# Patient Record
Sex: Female | Born: 1975 | Race: Black or African American | Hispanic: No | Marital: Married | Smoking: Current every day smoker
Health system: Southern US, Community
[De-identification: ages and names within clinical notes are randomized; demographics above are authoritative.]

## PROBLEM LIST (undated history)

## (undated) ENCOUNTER — Inpatient Hospital Stay (HOSPITAL_COMMUNITY): Payer: Self-pay

## (undated) DIAGNOSIS — I509 Heart failure, unspecified: Secondary | ICD-10-CM

## (undated) DIAGNOSIS — I10 Essential (primary) hypertension: Secondary | ICD-10-CM

## (undated) DIAGNOSIS — R000 Tachycardia, unspecified: Secondary | ICD-10-CM

## (undated) DIAGNOSIS — F3111 Bipolar disorder, current episode manic without psychotic features, mild: Secondary | ICD-10-CM

## (undated) DIAGNOSIS — M79603 Pain in arm, unspecified: Secondary | ICD-10-CM

## (undated) DIAGNOSIS — E0590 Thyrotoxicosis, unspecified without thyrotoxic crisis or storm: Secondary | ICD-10-CM

## (undated) DIAGNOSIS — Z970 Presence of artificial eye: Secondary | ICD-10-CM

## (undated) HISTORY — DX: Presence of artificial eye: Z97.0

## (undated) HISTORY — DX: Heart failure, unspecified: I50.9

## (undated) HISTORY — PX: DILATION AND CURETTAGE OF UTERUS: SHX78

## (undated) HISTORY — PX: EYE SURGERY: SHX253

## (undated) HISTORY — DX: Pain in arm, unspecified: M79.603

## (undated) HISTORY — DX: Essential (primary) hypertension: I10

## (undated) HISTORY — DX: Tachycardia, unspecified: R00.0

---

## 2007-03-29 ENCOUNTER — Emergency Department (HOSPITAL_COMMUNITY): Admission: EM | Admit: 2007-03-29 | Discharge: 2007-03-29 | Payer: Self-pay | Admitting: Family Medicine

## 2007-05-17 ENCOUNTER — Emergency Department (HOSPITAL_COMMUNITY): Admission: EM | Admit: 2007-05-17 | Discharge: 2007-05-17 | Payer: Self-pay | Admitting: Family Medicine

## 2010-02-10 ENCOUNTER — Emergency Department (HOSPITAL_COMMUNITY): Admission: EM | Admit: 2010-02-10 | Discharge: 2010-02-10 | Payer: Self-pay | Admitting: Emergency Medicine

## 2010-04-25 ENCOUNTER — Emergency Department (HOSPITAL_COMMUNITY): Admission: EM | Admit: 2010-04-25 | Discharge: 2010-04-25 | Payer: Self-pay | Admitting: Emergency Medicine

## 2010-05-03 ENCOUNTER — Observation Stay (HOSPITAL_COMMUNITY): Admission: EM | Admit: 2010-05-03 | Discharge: 2010-05-05 | Payer: Self-pay | Admitting: Emergency Medicine

## 2010-09-08 LAB — CBC
HCT: 37.1 % (ref 36.0–46.0)
MCHC: 33.4 g/dL (ref 30.0–36.0)
Platelets: 282 10*3/uL (ref 150–400)
RBC: 4.88 MIL/uL (ref 3.87–5.11)
RDW: 14.6 % (ref 11.5–15.5)
RDW: 14.6 % (ref 11.5–15.5)
WBC: 10.5 10*3/uL (ref 4.0–10.5)
WBC: 20.2 10*3/uL — ABNORMAL HIGH (ref 4.0–10.5)

## 2010-09-08 LAB — URINALYSIS, ROUTINE W REFLEX MICROSCOPIC
Glucose, UA: NEGATIVE mg/dL
Nitrite: NEGATIVE
Protein, ur: NEGATIVE mg/dL
pH: 6.5 (ref 5.0–8.0)

## 2010-09-08 LAB — BASIC METABOLIC PANEL
BUN: 11 mg/dL (ref 6–23)
Calcium: 8.4 mg/dL (ref 8.4–10.5)
Chloride: 102 mEq/L (ref 96–112)
Creatinine, Ser: 1.22 mg/dL — ABNORMAL HIGH (ref 0.4–1.2)
GFR calc Af Amer: >60 mL/min (ref >60)
GFR calc non Af Amer: 51 mL/min — ABNORMAL LOW (ref >60)

## 2010-09-08 LAB — URINE MICROSCOPIC-ADD ON

## 2010-09-08 LAB — GC/CHLAMYDIA PROBE AMP, GENITAL
Chlamydia, DNA Probe: NEGATIVE
GC Probe Amp, Genital: NEGATIVE

## 2010-09-08 LAB — COMPREHENSIVE METABOLIC PANEL
ALT: 94 U/L — ABNORMAL HIGH (ref 0–35)
AST: 49 U/L — ABNORMAL HIGH (ref 0–37)
Albumin: 3 g/dL — ABNORMAL LOW (ref 3.5–5.2)
Alkaline Phosphatase: 113 U/L (ref 39–117)
Calcium: 8.6 mg/dL (ref 8.4–10.5)
GFR calc Af Amer: >60 mL/min (ref >60)
Glucose, Bld: 250 mg/dL — ABNORMAL HIGH (ref 70–99)
Potassium: 4.1 mEq/L (ref 3.5–5.1)
Sodium: 141 mEq/L (ref 135–145)
Total Protein: 5.8 g/dL — ABNORMAL LOW (ref 6.0–8.3)

## 2010-09-08 LAB — DIFFERENTIAL
Basophils Absolute: 0 10*3/uL (ref 0.0–0.1)
Lymphocytes Relative: 26 % (ref 12–46)
Lymphs Abs: 2.7 10*3/uL (ref 0.7–4.0)
Neutrophils Relative %: 46 % (ref 43–77)

## 2010-09-08 LAB — WET PREP, GENITAL: Trich, Wet Prep: NONE SEEN

## 2010-09-11 LAB — DIFFERENTIAL
Basophils Relative: 2 % — ABNORMAL HIGH (ref 0–1)
Eosinophils Absolute: 0.3 10*3/uL (ref 0.0–0.7)
Lymphs Abs: 1.8 10*3/uL (ref 0.7–4.0)
Monocytes Absolute: 0.9 10*3/uL (ref 0.1–1.0)
Monocytes Relative: 14 % — ABNORMAL HIGH (ref 3–12)
Neutrophils Relative %: 52 % (ref 43–77)

## 2010-09-11 LAB — CBC
HCT: 35.2 % — ABNORMAL LOW (ref 36.0–46.0)
Hemoglobin: 11.9 g/dL — ABNORMAL LOW (ref 12.0–15.0)
MCH: 28.4 pg (ref 26.0–34.0)
MCHC: 33.8 g/dL (ref 30.0–36.0)
MCV: 84 fL (ref 78.0–100.0)
RBC: 4.19 MIL/uL (ref 3.87–5.11)

## 2010-09-11 LAB — POCT CARDIAC MARKERS
CKMB, poc: <1 ng/mL — ABNORMAL LOW (ref 1.0–8.0)
Myoglobin, poc: 51.6 ng/mL (ref 12–200)
Myoglobin, poc: 54.3 ng/mL (ref 12–200)
Troponin i, poc: <0.05 ng/mL (ref 0.00–0.09)

## 2010-09-11 LAB — POCT I-STAT, CHEM 8
Chloride: 107 mEq/L (ref 96–112)
Creatinine, Ser: 1.5 mg/dL — ABNORMAL HIGH (ref 0.4–1.2)
HCT: 39 % (ref 36.0–46.0)
Hemoglobin: 13.3 g/dL (ref 12.0–15.0)
Potassium: 3.7 mEq/L (ref 3.5–5.1)
Sodium: 139 mEq/L (ref 135–145)

## 2010-10-11 ENCOUNTER — Emergency Department (HOSPITAL_COMMUNITY)
Admission: EM | Admit: 2010-10-11 | Discharge: 2010-10-12 | Disposition: A | Discharge status: Home / Self Care | Payer: Medicare Other | Attending: Emergency Medicine | Admitting: Emergency Medicine

## 2010-10-11 ENCOUNTER — Emergency Department (HOSPITAL_COMMUNITY): Payer: Medicare Other

## 2010-10-11 DIAGNOSIS — K5289 Other specified noninfective gastroenteritis and colitis: Secondary | ICD-10-CM | POA: Insufficient documentation

## 2010-10-11 DIAGNOSIS — X58XXXA Exposure to other specified factors, initial encounter: Secondary | ICD-10-CM | POA: Insufficient documentation

## 2010-10-11 DIAGNOSIS — L259 Unspecified contact dermatitis, unspecified cause: Secondary | ICD-10-CM | POA: Insufficient documentation

## 2010-10-11 DIAGNOSIS — T7840XA Allergy, unspecified, initial encounter: Secondary | ICD-10-CM | POA: Insufficient documentation

## 2010-10-11 DIAGNOSIS — R220 Localized swelling, mass and lump, head: Secondary | ICD-10-CM | POA: Insufficient documentation

## 2010-10-11 DIAGNOSIS — I10 Essential (primary) hypertension: Secondary | ICD-10-CM | POA: Insufficient documentation

## 2010-10-11 LAB — URINALYSIS, ROUTINE W REFLEX MICROSCOPIC
Ketones, ur: NEGATIVE mg/dL
Nitrite: NEGATIVE
Protein, ur: NEGATIVE mg/dL
Urobilinogen, UA: 0.2 mg/dL (ref 0.0–1.0)

## 2010-10-11 LAB — DIFFERENTIAL
Basophils Absolute: 0 10*3/uL (ref 0.0–0.1)
Basophils Relative: 0 % (ref 0–1)
Eosinophils Absolute: 0.9 10*3/uL — ABNORMAL HIGH (ref 0.0–0.7)
Monocytes Absolute: 0.2 10*3/uL (ref 0.1–1.0)
Monocytes Relative: 3 % (ref 3–12)
Neutrophils Relative %: 74 % (ref 43–77)

## 2010-10-11 LAB — COMPREHENSIVE METABOLIC PANEL
Alkaline Phosphatase: 45 U/L (ref 39–117)
BUN: 15 mg/dL (ref 6–23)
Calcium: 8.3 mg/dL — ABNORMAL LOW (ref 8.4–10.5)
Glucose, Bld: 126 mg/dL — ABNORMAL HIGH (ref 70–99)
Potassium: 3.6 mEq/L (ref 3.5–5.1)
Total Protein: 6.2 g/dL (ref 6.0–8.3)

## 2010-10-11 LAB — CBC
MCH: 27.9 pg (ref 26.0–34.0)
MCHC: 33.2 g/dL (ref 30.0–36.0)
Platelets: 244 10*3/uL (ref 150–400)

## 2010-10-11 LAB — LIPASE, BLOOD: Lipase: 25 U/L (ref 11–59)

## 2010-10-12 LAB — RAPID URINE DRUG SCREEN, HOSP PERFORMED: Cocaine: NOT DETECTED

## 2010-10-12 LAB — CARBAMAZEPINE LEVEL, TOTAL: Carbamazepine Lvl: 5.6 ug/mL (ref 4.0–12.0)

## 2011-04-02 ENCOUNTER — Ambulatory Visit: Payer: Medicare Other | Admitting: Cardiovascular Disease

## 2011-04-06 LAB — POCT URINALYSIS DIP (DEVICE)
Bilirubin Urine: NEGATIVE
Glucose, UA: NEGATIVE
Nitrite: NEGATIVE
Operator id: 126491

## 2011-04-06 LAB — WET PREP, GENITAL
Trich, Wet Prep: NONE SEEN
Yeast Wet Prep HPF POC: NONE SEEN

## 2011-04-06 LAB — POCT PREGNANCY, URINE: Preg Test, Ur: NEGATIVE

## 2011-04-06 LAB — GC/CHLAMYDIA PROBE AMP, GENITAL: Chlamydia, DNA Probe: NEGATIVE

## 2011-04-07 ENCOUNTER — Encounter: Payer: Self-pay | Admitting: *Deleted

## 2011-04-08 ENCOUNTER — Encounter: Payer: Self-pay | Admitting: Cardiovascular Disease

## 2011-04-08 ENCOUNTER — Ambulatory Visit (INDEPENDENT_AMBULATORY_CARE_PROVIDER_SITE_OTHER): Payer: Medicare Other | Admitting: Cardiovascular Disease

## 2011-04-08 DIAGNOSIS — Z01810 Encounter for preprocedural cardiovascular examination: Secondary | ICD-10-CM

## 2011-04-08 DIAGNOSIS — R000 Tachycardia, unspecified: Secondary | ICD-10-CM | POA: Insufficient documentation

## 2011-04-08 DIAGNOSIS — I10 Essential (primary) hypertension: Secondary | ICD-10-CM | POA: Insufficient documentation

## 2011-04-08 LAB — CBC WITH DIFFERENTIAL/PLATELET
Basophils Relative: 0.6 % (ref 0.0–3.0)
Eosinophils Absolute: 0.3 10*3/uL (ref 0.0–0.7)
HCT: 38.6 % (ref 36.0–46.0)
Hemoglobin: 12.9 g/dL (ref 12.0–15.0)
MCHC: 33.3 g/dL (ref 30.0–36.0)
MCV: 87.3 fl (ref 78.0–100.0)
Monocytes Absolute: 0.8 10*3/uL (ref 0.1–1.0)
Neutro Abs: 4.9 10*3/uL (ref 1.4–7.7)
RBC: 4.42 Mil/uL (ref 3.87–5.11)

## 2011-04-08 LAB — I-STAT 8, (EC8 V) (CONVERTED LAB)
BUN: 11
Chloride: 103
Hemoglobin: 16.7 — ABNORMAL HIGH
Operator id: 239701
Potassium: 3.8
pCO2, Ven: 44.2 — ABNORMAL LOW
pH, Ven: 7.376 — ABNORMAL HIGH

## 2011-04-08 LAB — POCT I-STAT CREATININE
Creatinine, Ser: 1
Operator id: 239701

## 2011-04-08 LAB — POCT URINALYSIS DIP (DEVICE)
Bilirubin Urine: NEGATIVE
Ketones, ur: NEGATIVE
Protein, ur: NEGATIVE
Specific Gravity, Urine: <=1.005
pH: 6

## 2011-04-08 MED ORDER — METOPROLOL SUCCINATE ER 50 MG PO TB24: 50.0000 mg | ORAL_TABLET | Freq: Every day | ORAL | Status: DC

## 2011-04-08 MED ORDER — LOSARTAN POTASSIUM 50 MG PO TABS: 50.0000 mg | ORAL_TABLET | Freq: Every day | ORAL | Status: DC

## 2011-04-08 NOTE — Assessment & Plan Note (Signed)
Clear for surgery.  Will do echo to make sure no DCM.
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
## 2011-04-08 NOTE — Assessment & Plan Note (Signed)
Change to Toprol and ARB for better control.  Low sodium diet  Assess LVH and LV size on echo

## 2011-04-08 NOTE — Assessment & Plan Note (Signed)
Improved.  Check Hct and TSH.  Echo to R/O DCM  Denies relapse of drug use

## 2011-04-08 NOTE — Patient Instructions (Addendum)
STOP VISKEN  START METOPROLOL SUCC 50 MG ONCE DAILY  START LOSARTAN 50 MG ONCE DAILY  Your physician has requested that you have an echocardiogram. Echocardiography is a painless test that uses sound waves to create images of your heart. It provides your doctor with information about the size and shape of your heart and how well your heart's chambers and valves are working. This procedure takes approximately one hour. There are no restrictions for this procedure.   Your physician recommends that you return for lab work in: TODAY

## 2011-04-14 ENCOUNTER — Ambulatory Visit (HOSPITAL_COMMUNITY): Payer: Medicare Other | Attending: Cardiovascular Disease

## 2011-04-14 ENCOUNTER — Telehealth: Payer: Self-pay | Admitting: Cardiovascular Disease

## 2011-04-14 DIAGNOSIS — Z01810 Encounter for preprocedural cardiovascular examination: Secondary | ICD-10-CM | POA: Insufficient documentation

## 2011-04-14 DIAGNOSIS — R072 Precordial pain: Secondary | ICD-10-CM

## 2011-04-14 DIAGNOSIS — I079 Rheumatic tricuspid valve disease, unspecified: Secondary | ICD-10-CM | POA: Insufficient documentation

## 2011-04-14 DIAGNOSIS — R000 Tachycardia, unspecified: Secondary | ICD-10-CM | POA: Insufficient documentation

## 2011-04-14 DIAGNOSIS — I10 Essential (primary) hypertension: Secondary | ICD-10-CM

## 2011-04-15 NOTE — Telephone Encounter (Signed)
Please fax a clearance letter for ENT surgery.  Fax to [facility] [phone] attn lisa

## 2011-04-15 NOTE — Telephone Encounter (Signed)
Left message for pt of normal echo results. Office note with clearance and echo faxed to number provided. Christine Cobb .

## 2011-04-16 ENCOUNTER — Telehealth: Payer: Self-pay | Admitting: *Deleted

## 2011-04-16 NOTE — Telephone Encounter (Signed)
SEE LAB REPORT./CY

## 2011-04-27 ENCOUNTER — Encounter: Payer: Self-pay | Admitting: Cardiovascular Disease

## 2011-04-27 NOTE — Progress Notes (Signed)
Addended by: Kem Parkinson on: 04/27/2011 04:32 PM   Modules accepted: Orders

## 2011-08-11 DIAGNOSIS — L0390 Cellulitis, unspecified: Secondary | ICD-10-CM | POA: Diagnosis not present

## 2011-08-11 DIAGNOSIS — M542 Cervicalgia: Secondary | ICD-10-CM | POA: Diagnosis not present

## 2011-08-11 DIAGNOSIS — R946 Abnormal results of thyroid function studies: Secondary | ICD-10-CM | POA: Diagnosis not present

## 2011-08-11 DIAGNOSIS — R6889 Other general symptoms and signs: Secondary | ICD-10-CM | POA: Diagnosis not present

## 2011-08-11 DIAGNOSIS — L0291 Cutaneous abscess, unspecified: Secondary | ICD-10-CM | POA: Diagnosis not present

## 2011-08-19 DIAGNOSIS — E0590 Thyrotoxicosis, unspecified without thyrotoxic crisis or storm: Secondary | ICD-10-CM | POA: Diagnosis not present

## 2011-08-19 DIAGNOSIS — R6889 Other general symptoms and signs: Secondary | ICD-10-CM | POA: Diagnosis not present

## 2011-08-24 DIAGNOSIS — F4310 Post-traumatic stress disorder, unspecified: Secondary | ICD-10-CM | POA: Diagnosis not present

## 2011-08-24 DIAGNOSIS — F319 Bipolar disorder, unspecified: Secondary | ICD-10-CM | POA: Diagnosis not present

## 2011-09-02 ENCOUNTER — Emergency Department (HOSPITAL_COMMUNITY)
Admission: EM | Admit: 2011-09-02 | Discharge: 2011-09-02 | Disposition: A | Discharge status: Home / Self Care | Payer: Medicare Other | Attending: Emergency Medicine | Admitting: Emergency Medicine

## 2011-09-02 ENCOUNTER — Encounter (HOSPITAL_COMMUNITY): Payer: Self-pay | Admitting: *Deleted

## 2011-09-02 DIAGNOSIS — N898 Other specified noninflammatory disorders of vagina: Secondary | ICD-10-CM | POA: Diagnosis not present

## 2011-09-02 DIAGNOSIS — R109 Unspecified abdominal pain: Secondary | ICD-10-CM | POA: Insufficient documentation

## 2011-09-02 DIAGNOSIS — Z79899 Other long term (current) drug therapy: Secondary | ICD-10-CM | POA: Insufficient documentation

## 2011-09-02 DIAGNOSIS — I10 Essential (primary) hypertension: Secondary | ICD-10-CM | POA: Diagnosis not present

## 2011-09-02 MED ORDER — VALACYCLOVIR HCL 1 G PO TABS: 500.0000 mg | ORAL_TABLET | Freq: Two times a day (BID) | ORAL | Status: AC

## 2011-09-02 MED ORDER — VALACYCLOVIR HCL 1 G PO TABS: 500.0000 mg | ORAL_TABLET | Freq: Two times a day (BID) | ORAL | Status: DC

## 2011-09-02 MED ORDER — HYDROCODONE-ACETAMINOPHEN 5-325 MG PO TABS: 2.0000 | ORAL_TABLET | ORAL | Status: AC | PRN

## 2011-09-02 NOTE — ED Provider Notes (Signed)
History     CSN: [id number]  Arrival date & time 09/02/11  0807   First MD Initiated Contact with Patient 09/02/11 [phone]      Chief Complaint  Patient presents with  . Groin Pain    vaginal "bumps;painful     HPI Pt reports noticing painful "bumps" on the left side of her groin area. Pt denies noticing any drainage. Pt reports pain is so severe today that she can barely wear an underwear.  Past Medical History  Diagnosis Date  . Sinus tachycardia   . HTN (hypertension)   . Arm pain     History reviewed. No pertinent past surgical history.  No family history on file.  History  Substance Use Topics  . Smoking status: Current Everyday Smoker    Types: Cigarettes  . Smokeless tobacco: Not on file  . Alcohol Use: No    OB History    Grav Para Term Preterm Abortions TAB SAB Ect Mult Living                  Review of Systems  All other systems reviewed and are negative.    Allergies  Eggs or egg-derived products and Toradol  Home Medications   Current Outpatient Rx  Name Route Sig Dispense Refill  . LOSARTAN POTASSIUM 50 MG PO TABS Oral Take 1 tablet (50 mg total) by mouth daily. 30 tablet 12  . METOPROLOL SUCCINATE ER 50 MG PO TB24 Oral Take 1 tablet (50 mg total) by mouth daily. 30 tablet 11  . NON FORMULARY  25 mg. reperal injection Every 2 weeks    . PSEUDOEPH-DOXYLAMINE-DM-APAP 60-7.11-24-998 MG/30ML PO LIQD Oral Take 30 mLs by mouth at bedtime as needed. Cold/flu    . HYDROCODONE-ACETAMINOPHEN 5-325 MG PO TABS Oral Take 2 tablets by mouth every 4 (four) hours as needed for pain. 15 tablet 0  . VALACYCLOVIR HCL 1 G PO TABS Oral Take 0.5 tablets (500 mg total) by mouth 2 (two) times daily. 15 tablet 0    BP 138/99  Pulse 86  Temp(Src) 98.3 F (36.8 C) (Oral)  Resp 16  SpO2 100%  LMP 08/22/2011  Physical Exam  Nursing note and vitals reviewed. Constitutional: She is oriented to person, place, and time. She appears well-developed and well-nourished.  No distress.  HENT:  Head: Normocephalic and atraumatic.  Eyes: Pupils are equal, round, and reactive to light.  Neck: Normal range of motion.  Cardiovascular: Normal rate and intact distal pulses.   Pulmonary/Chest: No respiratory distress.  Abdominal: Normal appearance. She exhibits no distension.  Genitourinary:    Pelvic exam was performed with patient supine.  Musculoskeletal: Normal range of motion.  Neurological: She is alert and oriented to person, place, and time. No cranial nerve deficit.  Skin: Skin is warm and dry. No rash noted.  Psychiatric: She has a normal mood and affect. Her behavior is normal.    ED Course  Procedures (including critical care time)  Labs Reviewed - No data to display No results found.   1. Vaginal lesion       MDM  Viral culture obtained and awaiting results        Nelia Shi, MD 09/02/11 [phone]

## 2011-09-02 NOTE — Discharge Instructions (Signed)
Herpes Labialis  You have a fever blister or cold sore (herpes labialis). These painful, grouped sores are caused by one of the herpes viruses (HSV1 most commonly). They are usually found around the lips and mouth, but the same infection can also affect other areas on the face such as the nose and eyes. Herpes infections take about 10 days to heal. They often occur again and again in the same spot. Other symptoms may include numbness and tingling in the involved skin, achiness, fever, and swollen glands in the neck. Colds, emotional stress, injuries, or excess sunlight exposure all seem to make herpes reappear. Herpes lip infections are contagious. Direct contact with these sores can spread the infection. It can also be spread to other parts of your own body.  TREATMENT   Herpes labialis is usually self-limited and resolves within 1 week. To reduce pain and swelling, apply ice packs frequently to the sores or suck on popsicles or frozen juice bars. Antiviral medicine may be used by mouth to shorten the duration of the breakout. Avoid spreading the infection by washing your hands often. Be careful not to touch your eyes or genital areas after handling the infected blisters. Do not kiss or have other intimate contact with others. After the blisters are completely healed you may resume contact. Use sunscreen to lessen recurrences.   If this is your first infection with herpes, or if you have a severe or repeated infections, your caregiver may prescribe one of the anti-viral drugs to speed up the healing. If you have sun-related flare-ups despite the use of sunscreen, starting oral anti-viral medicine before a prolonged exposure (going skiing or to the beach) can prevent most episodes.   SEEK IMMEDIATE MEDICAL CARE IF:   You develop a headache, sleepiness, high fever, vomiting, or severe weakness.   You have eye irritation, pain, blurred vision or redness.   You develop a prolonged infection not getting better in 10  days.  Document Released: 06/14/2005 Document Revised: 06/03/2011 Document Reviewed: 04/18/2009  [employer] Patient Information 2012 [employer].

## 2011-09-02 NOTE — ED Notes (Signed)
Pt reports noticing painful "bumps" on the left side of her groin area.  Pt denies noticing any drainage.  Pt reports pain is so severe today that she can barely wear an underwear.

## 2011-09-03 DIAGNOSIS — B009 Herpesviral infection, unspecified: Secondary | ICD-10-CM | POA: Diagnosis not present

## 2011-09-03 LAB — HERPES SIMPLEX VIRUS CULTURE

## 2011-09-04 NOTE — ED Notes (Signed)
+  Herpes. Patient treated with Valtrex. °

## 2011-09-07 NOTE — ED Notes (Signed)
Patient informed of positive results after id'd x 2 and educated to HERPES precautions.Marland Kitchen

## 2011-09-07 NOTE — ED Notes (Signed)
Patient informed of positive results after id'd x 2 and informed of need to notify partner to be treated. 

## 2011-10-20 ENCOUNTER — Emergency Department (HOSPITAL_COMMUNITY)
Admission: EM | Admit: 2011-10-20 | Discharge: 2011-10-20 | Disposition: A | Discharge status: Home / Self Care | Payer: Medicare Other | Attending: Emergency Medicine | Admitting: Emergency Medicine

## 2011-10-20 ENCOUNTER — Encounter (HOSPITAL_COMMUNITY): Payer: Self-pay | Admitting: Emergency Medicine

## 2011-10-20 DIAGNOSIS — F17200 Nicotine dependence, unspecified, uncomplicated: Secondary | ICD-10-CM | POA: Diagnosis not present

## 2011-10-20 DIAGNOSIS — R0789 Other chest pain: Secondary | ICD-10-CM | POA: Diagnosis not present

## 2011-10-20 DIAGNOSIS — F411 Generalized anxiety disorder: Secondary | ICD-10-CM | POA: Diagnosis not present

## 2011-10-20 DIAGNOSIS — I10 Essential (primary) hypertension: Secondary | ICD-10-CM | POA: Insufficient documentation

## 2011-10-20 DIAGNOSIS — F410 Panic disorder [episodic paroxysmal anxiety] without agoraphobia: Secondary | ICD-10-CM | POA: Diagnosis not present

## 2011-10-20 DIAGNOSIS — R0602 Shortness of breath: Secondary | ICD-10-CM | POA: Insufficient documentation

## 2011-10-20 NOTE — ED Notes (Signed)
Pt c/o chest pain as well as left-sided generalized pain that began earlier tonight. Pt states "it feels like an anxiety attack, i've had them since i was 10". Pt states she "has a lot going on" and "wants some help".

## 2011-10-20 NOTE — ED Provider Notes (Signed)
History     CSN: [id number]  Arrival date & time 10/20/11  2108   First MD Initiated Contact with Patient 10/20/11 2217      Chief Complaint  Patient presents with  . Panic Attack    (Consider location/radiation/quality/duration/timing/severity/associated sxs/prior treatment) HPI Comments: Patient presents today with complaints of a panic attack. She, states she's had a history of anxiety attacks, and she was 36 years old and today she had the same type symptoms, that she's had in the past. Nothing specifically that time. In she presented with shortness of breath, chest tightness, and anxiety, which she's had with her past panic attacks. She says that there is nothing unusual about this attack. She states now she feels much better and is ready to go home. Denies any leg pain or swelling. Denies any fevers or recent illnesses. Denies any suicidal ideations  The history is provided by the patient.    Past Medical History  Diagnosis Date  . Sinus tachycardia   . HTN (hypertension)   . Arm pain     History reviewed. No pertinent past surgical history.  History reviewed. No pertinent family history.  History  Substance Use Topics  . Smoking status: Current Everyday Smoker    Types: Cigarettes  . Smokeless tobacco: Not on file  . Alcohol Use: No    OB History    Grav Para Term Preterm Abortions TAB SAB Ect Mult Living                  Review of Systems  Constitutional: Negative for fever, chills, diaphoresis and fatigue.  HENT: Negative for congestion, rhinorrhea and sneezing.   Eyes: Negative.   Respiratory: Positive for shortness of breath. Negative for cough and chest tightness.   Cardiovascular: Positive for chest pain. Negative for leg swelling.  Gastrointestinal: Negative for nausea, vomiting, abdominal pain, diarrhea and blood in stool.  Genitourinary: Negative for frequency, hematuria, flank pain and difficulty urinating.  Musculoskeletal: Negative for back pain  and arthralgias.  Skin: Negative for rash.  Neurological: Negative for dizziness, speech difficulty, weakness, numbness and headaches.  Psychiatric/Behavioral: Positive for agitation. Negative for suicidal ideas. The patient is nervous/anxious.     Allergies  Eggs or egg-derived products and Toradol  Home Medications   Current Outpatient Rx  Name Route Sig Dispense Refill  . ALBUTEROL SULFATE HFA 108 (90 BASE) MCG/ACT IN AERS Inhalation Inhale 2 puffs into the lungs every 6 (six) hours as needed. For shortness of breath    . LOSARTAN POTASSIUM 50 MG PO TABS Oral Take 1 tablet (50 mg total) by mouth daily. 30 tablet 12  . METOPROLOL SUCCINATE ER 50 MG PO TB24 Oral Take 1 tablet (50 mg total) by mouth daily. 30 tablet 11  . NON FORMULARY  25 mg. reperal injection Every 2 weeks    . OXYCODONE-ACETAMINOPHEN 7.5-500 MG PO TABS Oral Take 1 tablet by mouth every 4 (four) hours as needed. For pain relief    . PSEUDOEPH-DOXYLAMINE-DM-APAP 60-7.11-24-998 MG/30ML PO LIQD Oral Take 30 mLs by mouth at bedtime as needed. Cold/flu      BP 121/94  Pulse 108  Temp(Src) 97.7 F (36.5 C) (Oral)  Resp 12  SpO2 100%  Physical Exam  Constitutional: She is oriented to person, place, and time. She appears well-developed and well-nourished.  HENT:  Head: Normocephalic and atraumatic.  Eyes: Pupils are equal, round, and reactive to light.  Neck: Normal range of motion. Neck supple.  Cardiovascular: Normal rate, regular  rhythm and normal heart sounds.   Pulmonary/Chest: Effort normal and breath sounds normal. No respiratory distress. She has no wheezes. She has no rales. She exhibits no tenderness.  Abdominal: Soft. Bowel sounds are normal. There is no tenderness. There is no rebound and no guarding.  Musculoskeletal: Normal range of motion. She exhibits no edema.  Lymphadenopathy:    She has no cervical adenopathy.  Neurological: She is alert and oriented to person, place, and time.  Skin: Skin is  warm and dry. No rash noted.  Psychiatric: She has a normal mood and affect.    ED Course  Procedures (including critical care time)  Labs Reviewed - No data to display No results found.  Date: 10/20/2011  Rate: 94  Rhythm: normal sinus rhythm  QRS Axis: normal  Intervals: normal  ST/T Wave abnormalities: normal  Conduction Disutrbances:none  Narrative Interpretation:   Old EKG Reviewed: unchanged    1. Anxiety attack       MDM  Pt with panic attack, similar to her past attacks.  Nothing unusual to suggest asthma exacerbation, PE, or ACS.        Rolan Bucco, MD 10/20/11 [phone]

## 2011-10-20 NOTE — Discharge Instructions (Signed)

## 2011-12-15 ENCOUNTER — Encounter (HOSPITAL_COMMUNITY): Payer: Self-pay | Admitting: *Deleted

## 2011-12-15 ENCOUNTER — Emergency Department (HOSPITAL_COMMUNITY)
Admission: EM | Admit: 2011-12-15 | Discharge: 2011-12-15 | Disposition: A | Discharge status: Home / Self Care | Payer: Medicare Other | Attending: Emergency Medicine | Admitting: Emergency Medicine

## 2011-12-15 DIAGNOSIS — Z79899 Other long term (current) drug therapy: Secondary | ICD-10-CM | POA: Insufficient documentation

## 2011-12-15 DIAGNOSIS — T07XXXA Unspecified multiple injuries, initial encounter: Secondary | ICD-10-CM

## 2011-12-15 DIAGNOSIS — S5010XA Contusion of unspecified forearm, initial encounter: Secondary | ICD-10-CM | POA: Diagnosis not present

## 2011-12-15 DIAGNOSIS — T7491XA Unspecified adult maltreatment, confirmed, initial encounter: Secondary | ICD-10-CM | POA: Insufficient documentation

## 2011-12-15 DIAGNOSIS — I10 Essential (primary) hypertension: Secondary | ICD-10-CM | POA: Insufficient documentation

## 2011-12-15 DIAGNOSIS — Z0289 Encounter for other administrative examinations: Secondary | ICD-10-CM | POA: Insufficient documentation

## 2011-12-15 DIAGNOSIS — S0003XA Contusion of scalp, initial encounter: Secondary | ICD-10-CM | POA: Insufficient documentation

## 2011-12-15 DIAGNOSIS — S0083XA Contusion of other part of head, initial encounter: Secondary | ICD-10-CM | POA: Diagnosis not present

## 2011-12-15 DIAGNOSIS — IMO0002 Reserved for concepts with insufficient information to code with codable children: Secondary | ICD-10-CM | POA: Insufficient documentation

## 2011-12-15 DIAGNOSIS — T7492XA Unspecified child maltreatment, confirmed, initial encounter: Secondary | ICD-10-CM | POA: Insufficient documentation

## 2011-12-15 DIAGNOSIS — F1410 Cocaine abuse, uncomplicated: Secondary | ICD-10-CM | POA: Insufficient documentation
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
## 2011-12-15 NOTE — ED Notes (Signed)
.  D/c instructions reviewed w/ pt - pt denies any further questions or concerns at present.  Pt ambulating independently w/ steady gait on d/c in no acute distress, A&Ox4. Pt d/c'd w/ [employer] x2.
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
## 2011-12-15 NOTE — Discharge Instructions (Signed)
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
## 2011-12-28 DIAGNOSIS — N760 Acute vaginitis: Secondary | ICD-10-CM | POA: Diagnosis not present

## 2011-12-28 DIAGNOSIS — Z7721 Contact with and (suspected) exposure to potentially hazardous body fluids: Secondary | ICD-10-CM | POA: Diagnosis not present

## 2011-12-28 DIAGNOSIS — A499 Bacterial infection, unspecified: Secondary | ICD-10-CM | POA: Diagnosis not present

## 2011-12-28 DIAGNOSIS — Z2089 Contact with and (suspected) exposure to other communicable diseases: Secondary | ICD-10-CM | POA: Diagnosis not present

## 2012-01-17 ENCOUNTER — Emergency Department (HOSPITAL_COMMUNITY)
Admission: EM | Admit: 2012-01-17 | Discharge: 2012-01-17 | Disposition: A | Discharge status: Home / Self Care | Payer: Medicare Other | Attending: Emergency Medicine | Admitting: Emergency Medicine

## 2012-01-17 DIAGNOSIS — F17200 Nicotine dependence, unspecified, uncomplicated: Secondary | ICD-10-CM | POA: Insufficient documentation

## 2012-01-17 DIAGNOSIS — I10 Essential (primary) hypertension: Secondary | ICD-10-CM | POA: Insufficient documentation

## 2012-01-17 DIAGNOSIS — R21 Rash and other nonspecific skin eruption: Secondary | ICD-10-CM

## 2012-01-17 MED ORDER — PREDNISONE 20 MG PO TABS: ORAL_TABLET | ORAL | Status: AC

## 2012-01-17 MED ORDER — PREDNISONE 20 MG PO TABS
60.0000 mg | ORAL_TABLET | Freq: Once | ORAL | Status: AC
  Administered 2012-01-17: 60 mg via ORAL
  Filled 2012-01-17: qty 3

## 2012-01-17 MED ORDER — FAMOTIDINE 20 MG PO TABS: 20.0000 mg | ORAL_TABLET | Freq: Two times a day (BID) | ORAL | Status: DC

## 2012-01-17 MED ORDER — DIPHENHYDRAMINE HCL 25 MG PO TABS: 25.0000 mg | ORAL_TABLET | Freq: Four times a day (QID) | ORAL | Status: DC

## 2012-01-17 MED ORDER — DIPHENHYDRAMINE HCL 25 MG PO CAPS
25.0000 mg | ORAL_CAPSULE | Freq: Once | ORAL | Status: AC
  Administered 2012-01-17: 25 mg via ORAL
  Filled 2012-01-17: qty 1

## 2012-01-17 NOTE — ED Provider Notes (Signed)
Medical screening examination/treatment/procedure(s) were performed by non-physician practitioner and as supervising physician I was immediately available for consultation/collaboration. Keison Glendinning, MD, FACEP   Josemiguel Gries L Mete Purdum, MD 01/17/12 1857 
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
## 2012-02-22 DIAGNOSIS — J029 Acute pharyngitis, unspecified: Secondary | ICD-10-CM | POA: Diagnosis not present

## 2012-02-22 DIAGNOSIS — N898 Other specified noninflammatory disorders of vagina: Secondary | ICD-10-CM | POA: Diagnosis not present

## 2012-02-22 DIAGNOSIS — N760 Acute vaginitis: Secondary | ICD-10-CM | POA: Diagnosis not present

## 2012-02-22 DIAGNOSIS — Z7721 Contact with and (suspected) exposure to potentially hazardous body fluids: Secondary | ICD-10-CM | POA: Diagnosis not present

## 2012-02-27 DIAGNOSIS — H5710 Ocular pain, unspecified eye: Secondary | ICD-10-CM | POA: Diagnosis not present

## 2012-02-27 DIAGNOSIS — M25569 Pain in unspecified knee: Secondary | ICD-10-CM | POA: Diagnosis not present

## 2012-02-28 ENCOUNTER — Encounter (HOSPITAL_COMMUNITY): Payer: Self-pay | Admitting: Emergency Medicine

## 2012-02-28 ENCOUNTER — Emergency Department (HOSPITAL_COMMUNITY)
Admission: EM | Admit: 2012-02-28 | Discharge: 2012-02-28 | Disposition: A | Discharge status: Home / Self Care | Payer: Medicare Other | Attending: Emergency Medicine | Admitting: Emergency Medicine

## 2012-02-28 DIAGNOSIS — Z8489 Family history of other specified conditions: Secondary | ICD-10-CM | POA: Diagnosis not present

## 2012-02-28 DIAGNOSIS — H5710 Ocular pain, unspecified eye: Secondary | ICD-10-CM | POA: Insufficient documentation

## 2012-02-28 DIAGNOSIS — Z91012 Allergy to eggs: Secondary | ICD-10-CM | POA: Insufficient documentation

## 2012-02-28 DIAGNOSIS — F17200 Nicotine dependence, unspecified, uncomplicated: Secondary | ICD-10-CM | POA: Diagnosis not present

## 2012-02-28 DIAGNOSIS — Z833 Family history of diabetes mellitus: Secondary | ICD-10-CM | POA: Diagnosis not present

## 2012-02-28 DIAGNOSIS — I10 Essential (primary) hypertension: Secondary | ICD-10-CM | POA: Insufficient documentation

## 2012-02-28 DIAGNOSIS — Z888 Allergy status to other drugs, medicaments and biological substances status: Secondary | ICD-10-CM | POA: Insufficient documentation

## 2012-02-28 DIAGNOSIS — Z970 Presence of artificial eye: Secondary | ICD-10-CM | POA: Diagnosis not present

## 2012-02-28 DIAGNOSIS — Z8249 Family history of ischemic heart disease and other diseases of the circulatory system: Secondary | ICD-10-CM | POA: Insufficient documentation

## 2012-02-28 MED ORDER — HYDROCODONE-ACETAMINOPHEN 5-325 MG PO TABS
1.0000 | ORAL_TABLET | Freq: Once | ORAL | Status: AC
  Administered 2012-02-28: 1 via ORAL
  Filled 2012-02-28: qty 1

## 2012-02-28 MED ORDER — HYDROCODONE-ACETAMINOPHEN 5-325 MG PO TABS: 1.0000 | ORAL_TABLET | Freq: Four times a day (QID) | ORAL | Status: AC | PRN
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
## 2012-02-28 NOTE — ED Provider Notes (Signed)
Medical screening examination/treatment/procedure(s) were performed by non-physician practitioner and as supervising physician I was immediately available for consultation/collaboration.   Fontaine Kossman L Manly Nestle, MD 02/28/12 2232 
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
## 2012-02-28 NOTE — ED Notes (Signed)
Pt does not know if she loss consciousness

## 2012-03-05 ENCOUNTER — Encounter (HOSPITAL_COMMUNITY): Payer: Self-pay

## 2012-03-05 ENCOUNTER — Emergency Department (HOSPITAL_COMMUNITY): Payer: Medicare Other

## 2012-03-05 ENCOUNTER — Emergency Department (HOSPITAL_COMMUNITY)
Admission: EM | Admit: 2012-03-05 | Discharge: 2012-03-06 | Disposition: A | Discharge status: Home / Self Care | Payer: Medicare Other | Attending: Emergency Medicine | Admitting: Emergency Medicine

## 2012-03-05 DIAGNOSIS — F17200 Nicotine dependence, unspecified, uncomplicated: Secondary | ICD-10-CM | POA: Insufficient documentation

## 2012-03-05 DIAGNOSIS — Z79899 Other long term (current) drug therapy: Secondary | ICD-10-CM | POA: Insufficient documentation

## 2012-03-05 DIAGNOSIS — I10 Essential (primary) hypertension: Secondary | ICD-10-CM | POA: Insufficient documentation

## 2012-03-05 DIAGNOSIS — R600 Localized edema: Secondary | ICD-10-CM

## 2012-03-05 DIAGNOSIS — R0602 Shortness of breath: Secondary | ICD-10-CM | POA: Insufficient documentation

## 2012-03-05 DIAGNOSIS — Z888 Allergy status to other drugs, medicaments and biological substances status: Secondary | ICD-10-CM | POA: Insufficient documentation

## 2012-03-05 DIAGNOSIS — T7840XA Allergy, unspecified, initial encounter: Secondary | ICD-10-CM | POA: Diagnosis not present

## 2012-03-05 DIAGNOSIS — R443 Hallucinations, unspecified: Secondary | ICD-10-CM

## 2012-03-05 DIAGNOSIS — R609 Edema, unspecified: Secondary | ICD-10-CM | POA: Insufficient documentation

## 2012-03-05 DIAGNOSIS — T4275XA Adverse effect of unspecified antiepileptic and sedative-hypnotic drugs, initial encounter: Secondary | ICD-10-CM | POA: Insufficient documentation

## 2012-03-05 LAB — URINALYSIS, ROUTINE W REFLEX MICROSCOPIC
Bilirubin Urine: NEGATIVE
Glucose, UA: NEGATIVE mg/dL
Ketones, ur: NEGATIVE mg/dL
pH: 7 (ref 5.0–8.0)

## 2012-03-05 LAB — URINE MICROSCOPIC-ADD ON

## 2012-03-05 LAB — POCT I-STAT, CHEM 8
Calcium, Ion: 1.17 mmol/L (ref 1.12–1.23)
Chloride: 106 mEq/L (ref 96–112)
HCT: 38 % (ref 36.0–46.0)
Hemoglobin: 12.9 g/dL (ref 12.0–15.0)
Potassium: 3.4 mEq/L — ABNORMAL LOW (ref 3.5–5.1)

## 2012-03-05 LAB — CBC WITH DIFFERENTIAL/PLATELET
Basophils Absolute: 0.1 10*3/uL (ref 0.0–0.1)
Eosinophils Absolute: 0.9 10*3/uL — ABNORMAL HIGH (ref 0.0–0.7)
Lymphocytes Relative: 37 % (ref 12–46)
Lymphs Abs: 4 10*3/uL (ref 0.7–4.0)
MCHC: 34.1 g/dL (ref 30.0–36.0)
Monocytes Relative: 9 % (ref 3–12)
Platelets: 320 10*3/uL (ref 150–400)
RDW: 14.5 % (ref 11.5–15.5)
WBC: 10.7 10*3/uL — ABNORMAL HIGH (ref 4.0–10.5)

## 2012-03-05 LAB — RAPID URINE DRUG SCREEN, HOSP PERFORMED
Amphetamines: NOT DETECTED
Barbiturates: NOT DETECTED
Benzodiazepines: NOT DETECTED
Cocaine: POSITIVE — AB

## 2012-03-05 LAB — CARBAMAZEPINE LEVEL, TOTAL: Carbamazepine Lvl: <0.5 ug/mL — ABNORMAL LOW (ref 4.0–12.0)

## 2012-03-05 MED ORDER — MORPHINE SULFATE 4 MG/ML IJ SOLN
4.0000 mg | Freq: Once | INTRAMUSCULAR | Status: AC
  Administered 2012-03-05: 4 mg via INTRAVENOUS
  Filled 2012-03-05: qty 1

## 2012-03-05 MED ORDER — METHYLPREDNISOLONE SODIUM SUCC 125 MG IJ SOLR
125.0000 mg | Freq: Once | INTRAMUSCULAR | Status: AC
  Administered 2012-03-05: 125 mg via INTRAVENOUS
  Filled 2012-03-05: qty 2

## 2012-03-05 MED ORDER — SODIUM CHLORIDE 0.9 % IV SOLN
Freq: Once | INTRAVENOUS | Status: AC
  Administered 2012-03-05: 22:00:00 via INTRAVENOUS

## 2012-03-05 MED ORDER — FAMOTIDINE IN NACL 20-0.9 MG/50ML-% IV SOLN
20.0000 mg | Freq: Once | INTRAVENOUS | Status: AC
  Administered 2012-03-05: 20 mg via INTRAVENOUS
  Filled 2012-03-05: qty 50

## 2012-03-05 MED ORDER — FUROSEMIDE 10 MG/ML IJ SOLN
20.0000 mg | Freq: Once | INTRAMUSCULAR | Status: AC
  Administered 2012-03-05: 20 mg via INTRAVENOUS
  Filled 2012-03-05: qty 4

## 2012-03-05 NOTE — ED Provider Notes (Signed)
History     CSN: [id number]  Arrival date & time 03/05/12  2034   First MD Initiated Contact with Patient 03/05/12 2104      Chief Complaint  Patient presents with  . Leg Swelling    (Consider location/radiation/quality/duration/timing/severity/associated sxs/prior treatment) HPI Comments: Patient states, that 2 days, ago.  She took the Tegretol tablets, that she had leftover she was hearing voices, and she was not due for her Risperdal injection.  Shortly after that, she developed lower extremity swelling, a facial rash, burning to her skin with  peeling, along the hairline and jaw line.  She also reports weakness, feeling unsteady, slight shortness of breath, dry  Mouth,  some difficulty swallowing.   She went to urgent care today with her complaints.  They told her she was in heart failure and discharged her. Patient has no history of cardiac dysfunction.,  Having had an echocardiogram in 2012, which was normal.   The history is provided by the patient.    Past Medical History  Diagnosis Date  . Sinus tachycardia   . HTN (hypertension)   . Arm pain     Past Surgical History  Procedure Date  . Dilation and curettage of uterus   . Abortion x 4     Family History  Problem Relation Age of Onset  . Hypertension Other   . Diabetes Other   . Emphysema Other     History  Substance Use Topics  . Smoking status: Current Everyday Smoker    Types: Cigarettes  . Smokeless tobacco: Not on file  . Alcohol Use: Yes     occ    OB History    Grav Para Term Preterm Abortions TAB SAB Ect Mult Living                  Review of Systems  Constitutional: Negative for fever and chills.  Eyes: Negative for visual disturbance.  Respiratory: Positive for shortness of breath.   Cardiovascular: Positive for leg swelling. Negative for chest pain and palpitations.  Gastrointestinal: Negative for nausea and diarrhea.  Genitourinary: Negative for dysuria and frequency.    Musculoskeletal: Positive for joint swelling.  Skin: Positive for rash. Negative for pallor.  Neurological: Positive for dizziness and weakness. Negative for headaches.  Psychiatric/Behavioral: Positive for hallucinations. Negative for behavioral problems.    Allergies  Eggs or egg-derived products; Ketorolac tromethamine; and Tegretol  Home Medications   Current Outpatient Rx  Name Route Sig Dispense Refill  . ALBUTEROL SULFATE HFA 108 (90 BASE) MCG/ACT IN AERS Inhalation Inhale 2 puffs into the lungs every 6 (six) hours as needed. For shortness of breath    . HYDROCODONE-ACETAMINOPHEN 5-325 MG PO TABS Oral Take 1 tablet by mouth every 6 (six) hours as needed for pain. 6 tablet 0  . LOSARTAN POTASSIUM 50 MG PO TABS Oral Take 50 mg by mouth daily.     Marland Kitchen METOPROLOL SUCCINATE ER 50 MG PO TB24 Oral Take 1 tablet (50 mg total) by mouth daily. 30 tablet 11  . OXYCODONE-ACETAMINOPHEN 10-650 MG PO TABS Oral Take 1 tablet by mouth every 6 (six) hours as needed. Pain    . RISPERIDONE MICROSPHERES 25 MG IM SUSR Intramuscular Inject 25 mg into the muscle every 14 (fourteen) days.    Marland Kitchen VALACYCLOVIR HCL 500 MG PO TABS Oral Take 500 mg by mouth 2 (two) times daily.    Marland Kitchen FAMOTIDINE 20 MG PO TABS Oral Take 1 tablet (20 mg total) by mouth  2 (two) times daily. 20 tablet 0  . FUROSEMIDE 20 MG PO TABS Oral Take 1 tablet (20 mg total) by mouth daily. 5 tablet 0  . HYDROCODONE-ACETAMINOPHEN 5-500 MG PO TABS Oral Take 1 tablet by mouth every 6 (six) hours as needed for pain. 5 tablet 0  . PREDNISONE 10 MG PO TABS Oral Take 2 tablets (20 mg total) by mouth daily. 15 tablet 0    BP 119/83  Pulse 86  Temp 98.3 F (36.8 C) (Oral)  Resp 18  Ht 5\' 4"  (1.626 m)  Wt 142 lb 6 oz (64.581 kg)  BMI 24.44 kg/m2  SpO2 98%  LMP 02/03/2012  Physical Exam  Constitutional: She is oriented to person, place, and time. She appears well-developed.  HENT:  Head: Normocephalic and atraumatic.  Right Ear: External ear  normal.  Left Ear: External ear normal.  Mouth/Throat: Uvula is midline and oropharynx is clear and moist. Mucous membranes are not dry.       Skin on the face is thickened, red, tender to touch, with peeling, along the corners of the many years.  Course of the mouth around the ears, around the hairline and along the jaw line  Eyes: Pupils are equal, round, and reactive to light.  Neck: Normal range of motion.  Cardiovascular: Normal rate.   Pulmonary/Chest: Effort normal. No respiratory distress. She has no wheezes. She exhibits no tenderness.  Abdominal: Soft. She exhibits no distension.  Musculoskeletal: Normal range of motion. She exhibits edema and tenderness.  Neurological: She is alert and oriented to person, place, and time.  Skin: Rash noted. There is erythema.    ED Course  Procedures (including critical care time)  Labs Reviewed  CARBAMAZEPINE LEVEL, TOTAL - Abnormal; Notable for the following:    Carbamazepine Lvl <0.5 (*)     All other components within normal limits  URINALYSIS, ROUTINE W REFLEX MICROSCOPIC - Abnormal; Notable for the following:    Hgb urine dipstick LARGE (*)     All other components within normal limits  URINE RAPID DRUG SCREEN (HOSP PERFORMED) - Abnormal; Notable for the following:    Cocaine POSITIVE (*)     Tetrahydrocannabinol POSITIVE (*)     All other components within normal limits  CBC WITH DIFFERENTIAL - Abnormal; Notable for the following:    WBC 10.7 (*)     Hemoglobin 11.7 (*)     HCT 34.3 (*)     Eosinophils Relative 8 (*)     Eosinophils Absolute 0.9 (*)     All other components within normal limits  POCT I-STAT, CHEM 8 - Abnormal; Notable for the following:    Potassium 3.4 (*)     BUN <3 (*)     All other components within normal limits  URINE MICROSCOPIC-ADD ON   Dg Chest 2 View  03/05/2012  *RADIOLOGY REPORT*  Clinical Data: Shortness of breath.  CHEST - 2 VIEW  Comparison: 10/11/2010.  Findings: Normal sized heart.  Clear  lungs with normal vascularity. Normal appearing bones.  IMPRESSION: Normal examination, unchanged.   Original Report Authenticated By: Darrol Angel, M.D.      1. Allergic reaction to drug   2. Edema of both legs   3. Hallucination       MDM   I think this patient is having a medication reaction to Tegratol .  Last took tablet of Tegretol 2 days ago.  She has not taken Tegretol in quite a while unsure why.  She  was stopped from taking Tegretol previously  Has been assessed by [facility] .  She states she is no longer hearing voices.  She has an appointment with [facility] in the morning at 11:00.  I will discharge her home with continued medication for her allergic reaction to Tegreto,l 3 more doses of Lasix as her feet are significantly less swollen and painful       Arman Filter, NP 03/06/12 0308  Arman Filter, NP 03/06/12 1610

## 2012-03-05 NOTE — ED Notes (Signed)
Pt states she developed facial swelling 2 days ago.  States she developed swelling to bilateral feet yesterday and now the swelling has progressed to her knees.  States she is having pain due to the swelling.  States she has been having some shortness of breath.  States she was seen at [facility] today and was told I had "heart failure".

## 2012-03-05 NOTE — ED Notes (Addendum)
Gail, NP at bedside. 

## 2012-03-05 NOTE — ED Notes (Signed)
In to see and evaluate this pt- Pt reports she did not want an IV just wanted a pain pill-

## 2012-03-05 NOTE — ED Notes (Signed)
Attempted to start IV, pt said she "did not want an IV started if you keep on thumping me like that when I'm already in pain.  Just give me a pill or something. I don't want this IV"  Spoke with Morrie Sheldon, RN who is at bedside.

## 2012-03-05 NOTE — ED Notes (Signed)
EKG printed and given to EDP Lynelle Doctor for review. Last confirmed EKG printed for comparison.

## 2012-03-06 MED ORDER — FAMOTIDINE 20 MG PO TABS: 20.0000 mg | ORAL_TABLET | Freq: Two times a day (BID) | ORAL | Status: DC

## 2012-03-06 MED ORDER — PREDNISONE 10 MG PO TABS: 20.0000 mg | ORAL_TABLET | Freq: Every day | ORAL | Status: DC

## 2012-03-06 MED ORDER — FUROSEMIDE 20 MG PO TABS: 20.0000 mg | ORAL_TABLET | Freq: Every day | ORAL | Status: DC

## 2012-03-06 MED ORDER — HYDROCODONE-ACETAMINOPHEN 5-500 MG PO TABS: 1.0000 | ORAL_TABLET | Freq: Four times a day (QID) | ORAL | Status: AC | PRN
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
## 2012-03-06 NOTE — ED Notes (Signed)
Pt able to ambulate with minimal assistance. Pt c/o "puffiness in left foot". Pt states that she "feels a lot better then when I came in here".

## 2012-03-07 DIAGNOSIS — F259 Schizoaffective disorder, unspecified: Secondary | ICD-10-CM | POA: Diagnosis not present

## 2012-03-07 NOTE — ED Provider Notes (Signed)
Medical screening examination/treatment/procedure(s) were performed by non-physician practitioner and as supervising physician I was immediately available for consultation/collaboration.   Aeson Sawyers R Mars Scheaffer, MD 03/07/12 0851 

## 2012-04-03 DIAGNOSIS — I509 Heart failure, unspecified: Secondary | ICD-10-CM | POA: Diagnosis not present

## 2012-04-28 DIAGNOSIS — M545 Low back pain: Secondary | ICD-10-CM | POA: Diagnosis not present

## 2012-04-28 DIAGNOSIS — I10 Essential (primary) hypertension: Secondary | ICD-10-CM | POA: Diagnosis not present

## 2012-04-28 DIAGNOSIS — Z79899 Other long term (current) drug therapy: Secondary | ICD-10-CM | POA: Insufficient documentation

## 2012-04-28 DIAGNOSIS — M549 Dorsalgia, unspecified: Secondary | ICD-10-CM | POA: Diagnosis not present

## 2012-04-28 DIAGNOSIS — F17200 Nicotine dependence, unspecified, uncomplicated: Secondary | ICD-10-CM | POA: Diagnosis not present

## 2012-04-28 DIAGNOSIS — I498 Other specified cardiac arrhythmias: Secondary | ICD-10-CM | POA: Insufficient documentation

## 2012-04-29 ENCOUNTER — Encounter (HOSPITAL_COMMUNITY): Payer: Self-pay | Admitting: Emergency Medicine

## 2012-04-29 ENCOUNTER — Emergency Department (HOSPITAL_COMMUNITY)
Admission: EM | Admit: 2012-04-29 | Discharge: 2012-04-29 | Disposition: A | Discharge status: Home / Self Care | Payer: Medicare Other | Attending: Emergency Medicine | Admitting: Emergency Medicine

## 2012-04-29 DIAGNOSIS — M549 Dorsalgia, unspecified: Secondary | ICD-10-CM

## 2012-04-29 MED ORDER — CYCLOBENZAPRINE HCL 10 MG PO TABS
5.0000 mg | ORAL_TABLET | Freq: Once | ORAL | Status: AC
  Administered 2012-04-29: 5 mg via ORAL
  Filled 2012-04-29: qty 2

## 2012-04-29 MED ORDER — ACETAMINOPHEN 325 MG PO TABS
650.0000 mg | ORAL_TABLET | Freq: Once | ORAL | Status: AC
  Administered 2012-04-29: 650 mg via ORAL
  Filled 2012-04-29: qty 2

## 2012-04-29 MED ORDER — CYCLOBENZAPRINE HCL 10 MG PO TABS: 10.0000 mg | ORAL_TABLET | Freq: Two times a day (BID) | ORAL | Status: DC | PRN

## 2012-04-29 NOTE — ED Provider Notes (Signed)
History     CSN: [id number]  Arrival date & time 04/28/12  2350   First MD Initiated Contact with Patient 04/29/12 0303      Chief Complaint  Patient presents with  . Back Pain    (Consider location/radiation/quality/duration/timing/severity/associated sxs/prior treatment) HPI  Issue complaining of low back pain for 2 days. She describes it as aching in her mid back. She denies any trauma. She denies any numbness, tingling, or weakness. She has not fallen.  Past Medical History  Diagnosis Date  . Sinus tachycardia   . HTN (hypertension)   . Arm pain     Past Surgical History  Procedure Date  . Dilation and curettage of uterus   . Abortion x 4     Family History  Problem Relation Age of Onset  . Hypertension Other   . Diabetes Other   . Emphysema Other     History  Substance Use Topics  . Smoking status: Current Every Day Smoker    Types: Cigarettes  . Smokeless tobacco: Not on file  . Alcohol Use: Yes     occ    OB History    Grav Para Term Preterm Abortions TAB SAB Ect Mult Living                  Review of Systems  Constitutional: Negative.   Eyes: Negative.   Respiratory: Negative.   Cardiovascular: Negative.   Neurological: Negative.     Allergies  Eggs or egg-derived products; Ketorolac tromethamine; and Tegretol  Home Medications   Current Outpatient Rx  Name Route Sig Dispense Refill  . ALBUTEROL SULFATE HFA 108 (90 BASE) MCG/ACT IN AERS Inhalation Inhale 2 puffs into the lungs every 6 (six) hours as needed. For shortness of breath    . LISINOPRIL 20 MG PO TABS Oral Take 20 mg by mouth daily.    Marland Kitchen OMEPRAZOLE 20 MG PO CPDR Oral Take 20 mg by mouth daily.    . OXYCODONE-ACETAMINOPHEN 10-650 MG PO TABS Oral Take 1 tablet by mouth every 6 (six) hours as needed. Pain    . RISPERIDONE MICROSPHERES 25 MG IM SUSR Intramuscular Inject 25 mg into the muscle every 14 (fourteen) days.    Marland Kitchen VALACYCLOVIR HCL 500 MG PO TABS Oral Take 500 mg by mouth 2  (two) times daily.      BP 98/61  Pulse 90  Temp 97.4 F (36.3 C) (Oral)  Resp 18  SpO2 100%  Physical Exam  Nursing note and vitals reviewed. Constitutional: She appears well-developed and well-nourished.  HENT:  Head: Normocephalic and atraumatic.  Eyes: Conjunctivae normal and EOM are normal. Pupils are equal, round, and reactive to light.  Neck: Normal range of motion. Neck supple.  Cardiovascular: Normal rate and regular rhythm.   Pulmonary/Chest: Effort normal and breath sounds normal.  Abdominal: Soft. Bowel sounds are normal.  Musculoskeletal: Normal range of motion.  Neurological: She is alert. She has normal strength and normal reflexes. No sensory deficit. GCS eye subscore is 4. GCS verbal subscore is 5. GCS motor subscore is 6.    ED Course  Procedures (including critical care time)  Labs Reviewed - No data to display No results found.   No diagnosis found.    MDM  Plan flexeril andacetaminophen        Hilario Quarry, MD 04/29/12 [phone]

## 2012-04-29 NOTE — ED Notes (Signed)
Pt alert, arrives from home, c/o constipation, states "my colon is full", resp even unlabored, skin pwd

## 2012-05-10 DIAGNOSIS — F259 Schizoaffective disorder, unspecified: Secondary | ICD-10-CM | POA: Diagnosis not present

## 2012-05-22 DIAGNOSIS — K5900 Constipation, unspecified: Secondary | ICD-10-CM | POA: Diagnosis not present

## 2012-05-31 DIAGNOSIS — F259 Schizoaffective disorder, unspecified: Secondary | ICD-10-CM | POA: Diagnosis not present

## 2012-06-14 DIAGNOSIS — F259 Schizoaffective disorder, unspecified: Secondary | ICD-10-CM | POA: Diagnosis not present

## 2012-07-10 DIAGNOSIS — F259 Schizoaffective disorder, unspecified: Secondary | ICD-10-CM | POA: Diagnosis not present

## 2012-07-21 DIAGNOSIS — F259 Schizoaffective disorder, unspecified: Secondary | ICD-10-CM | POA: Diagnosis not present

## 2012-08-04 DIAGNOSIS — F259 Schizoaffective disorder, unspecified: Secondary | ICD-10-CM | POA: Diagnosis not present

## 2012-08-22 DIAGNOSIS — I10 Essential (primary) hypertension: Secondary | ICD-10-CM | POA: Diagnosis not present

## 2012-08-23 DIAGNOSIS — I10 Essential (primary) hypertension: Secondary | ICD-10-CM | POA: Diagnosis not present

## 2012-08-23 DIAGNOSIS — A6000 Herpesviral infection of urogenital system, unspecified: Secondary | ICD-10-CM | POA: Diagnosis not present

## 2012-08-24 DIAGNOSIS — N760 Acute vaginitis: Secondary | ICD-10-CM | POA: Diagnosis not present

## 2012-08-30 DIAGNOSIS — A6000 Herpesviral infection of urogenital system, unspecified: Secondary | ICD-10-CM | POA: Diagnosis not present

## 2012-09-02 ENCOUNTER — Emergency Department (HOSPITAL_COMMUNITY): Payer: Medicare Other

## 2012-09-02 DIAGNOSIS — S6990XA Unspecified injury of unspecified wrist, hand and finger(s), initial encounter: Secondary | ICD-10-CM | POA: Insufficient documentation

## 2012-09-02 DIAGNOSIS — W230XXA Caught, crushed, jammed, or pinched between moving objects, initial encounter: Secondary | ICD-10-CM | POA: Insufficient documentation

## 2012-09-02 DIAGNOSIS — S6710XA Crushing injury of unspecified finger(s), initial encounter: Secondary | ICD-10-CM | POA: Diagnosis not present

## 2012-09-02 DIAGNOSIS — Y939 Activity, unspecified: Secondary | ICD-10-CM | POA: Insufficient documentation

## 2012-09-02 DIAGNOSIS — Y929 Unspecified place or not applicable: Secondary | ICD-10-CM | POA: Insufficient documentation

## 2012-09-03 ENCOUNTER — Encounter (HOSPITAL_COMMUNITY): Payer: Self-pay | Admitting: *Deleted

## 2012-09-03 ENCOUNTER — Emergency Department (HOSPITAL_COMMUNITY)
Admission: EM | Admit: 2012-09-03 | Discharge: 2012-09-03 | Discharge status: Against Medical Advice | Payer: Medicare Other | Attending: Emergency Medicine | Admitting: Emergency Medicine

## 2012-09-03 MED ORDER — ACETAMINOPHEN 325 MG PO TABS
650.0000 mg | ORAL_TABLET | Freq: Once | ORAL | Status: AC
  Administered 2012-09-03: 650 mg via ORAL
  Filled 2012-09-03: qty 2

## 2012-09-03 NOTE — ED Notes (Signed)
Pt reports that she slammed her hand in a truck door today, c/o pain in right thumb area

## 2012-09-03 NOTE — ED Notes (Signed)
Pt blankets on floor and patient not in room x1

## 2012-09-03 NOTE — ED Notes (Signed)
Pt still has not returned to the room

## 2012-09-04 DIAGNOSIS — F259 Schizoaffective disorder, unspecified: Secondary | ICD-10-CM | POA: Diagnosis not present

## 2012-09-25 DIAGNOSIS — F259 Schizoaffective disorder, unspecified: Secondary | ICD-10-CM | POA: Diagnosis not present

## 2012-10-06 ENCOUNTER — Other Ambulatory Visit: Payer: Self-pay | Admitting: Physician Assistant

## 2012-10-06 NOTE — Telephone Encounter (Signed)
E rx shows errror.  Med called to pharmacy

## 2012-10-09 DIAGNOSIS — F259 Schizoaffective disorder, unspecified: Secondary | ICD-10-CM | POA: Diagnosis not present

## 2012-10-16 DIAGNOSIS — M545 Low back pain: Secondary | ICD-10-CM | POA: Diagnosis not present

## 2012-10-16 DIAGNOSIS — I509 Heart failure, unspecified: Secondary | ICD-10-CM | POA: Diagnosis not present

## 2012-10-16 DIAGNOSIS — M79609 Pain in unspecified limb: Secondary | ICD-10-CM | POA: Diagnosis not present

## 2012-10-16 DIAGNOSIS — F308 Other manic episodes: Secondary | ICD-10-CM | POA: Diagnosis not present

## 2012-10-17 DIAGNOSIS — L02219 Cutaneous abscess of trunk, unspecified: Secondary | ICD-10-CM | POA: Diagnosis not present

## 2012-10-18 ENCOUNTER — Ambulatory Visit (INDEPENDENT_AMBULATORY_CARE_PROVIDER_SITE_OTHER): Payer: Medicare Other | Admitting: General Surgery

## 2012-10-18 ENCOUNTER — Encounter (INDEPENDENT_AMBULATORY_CARE_PROVIDER_SITE_OTHER): Payer: Self-pay | Admitting: General Surgery

## 2012-10-18 ENCOUNTER — Emergency Department (HOSPITAL_COMMUNITY)
Admission: EM | Admit: 2012-10-18 | Discharge: 2012-10-18 | Disposition: A | Discharge status: Home / Self Care | Payer: Medicare Other | Source: Home / Self Care

## 2012-10-18 VITALS — BP 116/80 | HR 84 | Temp 98.2°F | Resp 16 | Ht 64.0 in | Wt 152.0 lb

## 2012-10-18 DIAGNOSIS — L02219 Cutaneous abscess of trunk, unspecified: Secondary | ICD-10-CM

## 2012-10-18 DIAGNOSIS — L02211 Cutaneous abscess of abdominal wall: Secondary | ICD-10-CM | POA: Insufficient documentation

## 2012-10-18 DIAGNOSIS — L03319 Cellulitis of trunk, unspecified: Secondary | ICD-10-CM | POA: Diagnosis not present

## 2012-10-18 NOTE — Patient Instructions (Signed)
Remove dressing tomorrow and shower. Cover the area with clean gauze as needed

## 2012-10-19 ENCOUNTER — Encounter (INDEPENDENT_AMBULATORY_CARE_PROVIDER_SITE_OTHER): Payer: Self-pay | Admitting: General Surgery

## 2012-10-19 NOTE — Progress Notes (Signed)
Subjective:     Patient ID: Christine Cobb, female   DOB: July 12, 1975, 37 y.o.   MRN: [id number]  HPI We're asked to see the patient in consultation by Dr. Clide Deutscher to evaluate her for an abscess on her lower abdominal wall. The patient is a 37 year old black female who first noticed is tender swollen area on her lower abdominal wall about 3 days ago. She went to her medical doctor who placed her on amoxicillin but she developed a rash to this medicine. She denies any fevers or chills. She denies any drainage from the area.  Review of Systems  Constitutional: Negative.   HENT: Negative.   Eyes: Negative.   Respiratory: Positive for shortness of breath.   Cardiovascular: Positive for leg swelling.  Gastrointestinal: Negative.   Endocrine: Negative.   Genitourinary: Negative.   Musculoskeletal: Negative.   Skin: Negative.   Allergic/Immunologic: Negative.   Neurological: Negative.   Hematological: Negative.   Psychiatric/Behavioral: Negative.        Objective:   Physical Exam  Constitutional: She is oriented to person, place, and time. She appears well-developed and well-nourished.  HENT:  Head: Normocephalic and atraumatic.  Eyes: Conjunctivae and EOM are normal. Pupils are equal, round, and reactive to light.  Neck: Normal range of motion. Neck supple.  Cardiovascular: Normal rate, regular rhythm and normal heart sounds.   Pulmonary/Chest: Effort normal and breath sounds normal.  Abdominal: Soft. Bowel sounds are normal.  There is a small abscess in the suprapubic area.  Musculoskeletal: Normal range of motion.  Neurological: She is alert and oriented to person, place, and time.  Skin: Skin is warm and dry.  Psychiatric: She has a normal mood and affect. Her behavior is normal.       Assessment:     The patient has a small abscess in the suprapubic area.     Plan:     The area was prepped with ChloraPrep and infiltrated with 1% lidocaine. A small incision was made into the  abscess with an 11 blade knife. A large amount of purulent material was evacuated. The wound was then packed with gauze and a sterile dressing was applied. She tolerated this well. We will plan to see her back in the next week or 2 to check her progress. She will remove the dressing tomorrow and start showering and keeping the area clean

## 2012-10-26 DIAGNOSIS — Z331 Pregnant state, incidental: Secondary | ICD-10-CM | POA: Diagnosis not present

## 2012-11-06 ENCOUNTER — Encounter (INDEPENDENT_AMBULATORY_CARE_PROVIDER_SITE_OTHER): Payer: Self-pay | Admitting: General Surgery

## 2012-11-06 ENCOUNTER — Ambulatory Visit (INDEPENDENT_AMBULATORY_CARE_PROVIDER_SITE_OTHER): Payer: Medicare Other | Admitting: General Surgery

## 2012-11-06 VITALS — BP 118/88 | HR 83 | Temp 97.7°F | Resp 16 | Ht 64.0 in | Wt 141.2 lb

## 2012-11-06 DIAGNOSIS — L02211 Cutaneous abscess of abdominal wall: Secondary | ICD-10-CM

## 2012-11-06 DIAGNOSIS — L02219 Cutaneous abscess of trunk, unspecified: Secondary | ICD-10-CM | POA: Diagnosis not present

## 2012-11-06 NOTE — Progress Notes (Signed)
Subjective:     Patient ID: Christine Cobb, female   DOB: 29-Jun-1975, 37 y.o.   MRN: [id number]  HPI The patient is a 37 year old black female who is about 2 weeks status post incision and drainage of a suprapubic abscess. The patient feels good today and has no complaints. She feels that the area is completely healed.  Review of Systems     Objective:   Physical Exam On exam the area that was incised has completely healed. There is no sign of infection    Assessment:     The patient is status post incision and drainage of suprapubic abscess     Plan:     At this point I have encouraged her to shower with an antibacterial soap like dial every day. She may do better shaving with an Neurosurgeon. We will plan to see her back on a when necessary basis

## 2012-11-06 NOTE — Patient Instructions (Signed)
Wash daily with dial soap

## 2012-12-04 DIAGNOSIS — M79609 Pain in unspecified limb: Secondary | ICD-10-CM | POA: Diagnosis not present

## 2012-12-04 DIAGNOSIS — I509 Heart failure, unspecified: Secondary | ICD-10-CM | POA: Diagnosis not present

## 2012-12-04 DIAGNOSIS — F319 Bipolar disorder, unspecified: Secondary | ICD-10-CM | POA: Diagnosis not present

## 2012-12-04 DIAGNOSIS — M545 Low back pain: Secondary | ICD-10-CM | POA: Diagnosis not present

## 2013-01-01 ENCOUNTER — Inpatient Hospital Stay (HOSPITAL_COMMUNITY): Payer: Medicare Other

## 2013-01-01 ENCOUNTER — Inpatient Hospital Stay (HOSPITAL_COMMUNITY)
Admission: AD | Admit: 2013-01-01 | Discharge: 2013-01-01 | Disposition: A | Discharge status: Home / Self Care | Payer: Medicare Other | Source: Ambulatory Visit | Attending: Family Medicine | Admitting: Family Medicine

## 2013-01-01 ENCOUNTER — Encounter (HOSPITAL_COMMUNITY): Payer: Self-pay

## 2013-01-01 DIAGNOSIS — Z3201 Encounter for pregnancy test, result positive: Secondary | ICD-10-CM

## 2013-01-01 DIAGNOSIS — O9989 Other specified diseases and conditions complicating pregnancy, childbirth and the puerperium: Secondary | ICD-10-CM | POA: Diagnosis not present

## 2013-01-01 DIAGNOSIS — R109 Unspecified abdominal pain: Secondary | ICD-10-CM | POA: Insufficient documentation

## 2013-01-01 DIAGNOSIS — N949 Unspecified condition associated with female genital organs and menstrual cycle: Secondary | ICD-10-CM | POA: Insufficient documentation

## 2013-01-01 DIAGNOSIS — M549 Dorsalgia, unspecified: Secondary | ICD-10-CM | POA: Diagnosis not present

## 2013-01-01 DIAGNOSIS — Z3490 Encounter for supervision of normal pregnancy, unspecified, unspecified trimester: Secondary | ICD-10-CM

## 2013-01-01 HISTORY — DX: Bipolar disorder, current episode manic without psychotic features, mild: F31.11

## 2013-01-01 LAB — WET PREP, GENITAL: Trich, Wet Prep: NONE SEEN

## 2013-01-01 LAB — URINALYSIS, ROUTINE W REFLEX MICROSCOPIC
Bilirubin Urine: NEGATIVE
Ketones, ur: NEGATIVE mg/dL
Nitrite: NEGATIVE
Protein, ur: NEGATIVE mg/dL
pH: 6 (ref 5.0–8.0)

## 2013-01-01 LAB — POCT PREGNANCY, URINE: Preg Test, Ur: POSITIVE — AB

## 2013-01-01 LAB — URINE MICROSCOPIC-ADD ON

## 2013-01-01 LAB — RAPID URINE DRUG SCREEN, HOSP PERFORMED
Benzodiazepines: NOT DETECTED
Opiates: NOT DETECTED

## 2013-01-01 NOTE — MAU Note (Signed)
Pt LMP 11/28/2012, +UPT at home, abd pain x 2 weeks, back pain x 2 days.  White vaginal discharge.

## 2013-01-02 LAB — GC/CHLAMYDIA PROBE AMP
CT Probe RNA: NEGATIVE
GC Probe RNA: NEGATIVE

## 2013-01-24 ENCOUNTER — Inpatient Hospital Stay (HOSPITAL_COMMUNITY)
Admission: AD | Admit: 2013-01-24 | Discharge: 2013-01-24 | Disposition: A | Discharge status: Home / Self Care | Payer: Medicare Other | Source: Ambulatory Visit | Attending: Obstetrics & Gynecology | Admitting: Obstetrics & Gynecology

## 2013-01-24 ENCOUNTER — Inpatient Hospital Stay (HOSPITAL_COMMUNITY): Payer: Medicare Other

## 2013-01-24 ENCOUNTER — Encounter (HOSPITAL_COMMUNITY): Payer: Self-pay

## 2013-01-24 DIAGNOSIS — R109 Unspecified abdominal pain: Secondary | ICD-10-CM | POA: Diagnosis not present

## 2013-01-24 DIAGNOSIS — K5900 Constipation, unspecified: Secondary | ICD-10-CM | POA: Insufficient documentation

## 2013-01-24 DIAGNOSIS — O99891 Other specified diseases and conditions complicating pregnancy: Secondary | ICD-10-CM | POA: Insufficient documentation

## 2013-01-24 DIAGNOSIS — O210 Mild hyperemesis gravidarum: Secondary | ICD-10-CM | POA: Diagnosis not present

## 2013-01-24 DIAGNOSIS — O219 Vomiting of pregnancy, unspecified: Secondary | ICD-10-CM

## 2013-01-24 DIAGNOSIS — O3680X0 Pregnancy with inconclusive fetal viability, not applicable or unspecified: Secondary | ICD-10-CM | POA: Diagnosis not present

## 2013-01-24 LAB — URINALYSIS, ROUTINE W REFLEX MICROSCOPIC
Bilirubin Urine: NEGATIVE
Protein, ur: NEGATIVE mg/dL
Urobilinogen, UA: 0.2 mg/dL (ref 0.0–1.0)

## 2013-01-24 MED ORDER — FAMOTIDINE IN NACL 20-0.9 MG/50ML-% IV SOLN
20.0000 mg | Freq: Once | INTRAVENOUS | Status: AC
  Administered 2013-01-24: 20 mg via INTRAVENOUS
  Filled 2013-01-24: qty 50

## 2013-01-24 MED ORDER — PROMETHAZINE HCL 25 MG/ML IJ SOLN
25.0000 mg | Freq: Once | INTRAVENOUS | Status: AC
  Administered 2013-01-24: 25 mg via INTRAVENOUS
  Filled 2013-01-24: qty 1

## 2013-01-24 MED ORDER — ACETAMINOPHEN 500 MG PO TABS
1000.0000 mg | ORAL_TABLET | Freq: Once | ORAL | Status: AC
  Administered 2013-01-24: 1000 mg via ORAL
  Filled 2013-01-24: qty 2

## 2013-01-24 MED ORDER — PROMETHAZINE HCL 25 MG PO TABS: 25.0000 mg | ORAL_TABLET | Freq: Four times a day (QID) | ORAL | Status: DC | PRN

## 2013-01-24 NOTE — MAU Note (Addendum)
Has not been feeling well, past 4 days has not been able to keep anything down.  Has been cramping in lower abd.  Has appt in clinic tomorrow, didn't think she could wait.

## 2013-01-24 NOTE — MAU Provider Note (Signed)
History     CSN: [id number]  Arrival date and time: 01/24/13 7829   First Provider Initiated Contact with Patient 01/24/13 1033      Chief Complaint  Patient presents with  . Nausea  . Abdominal Pain   HPI [name] is a 37 y.o. [id number] at [redacted]w[redacted]d who presents to MAU today with multiple complaints. The patient was seen in MAU on 01/01/13 and was diagnosed with a yeast infection. She had an [place] that day that showed a probably IUGS without a YS. Since that time the patient states that she has had 8/10 lower abdominal pain often. She takes Tramadol for pain, last dose was taken 2 days ago. She also complains of frequent N/V and constipation. She has not tried any anti-emetics during this pregnancy. She states that she has lost 10 lbs since the last visit to MAU. She states a fever of 101F 2 days ago, but none today. She denies UTI symptoms, vaginal bleeding or discharge. The patient has an appointment to start prenatal care in [facility] on 02/01/13.   OB History   Grav Para Term Preterm Abortions TAB SAB Ect Mult Living   5 1 1  2 1 1   1       Past Medical History  Diagnosis Date  . Sinus tachycardia   . HTN (hypertension)   . Arm pain   . Eye globe prosthesis   . CHF (congestive heart failure)   . Bipolar affective disorder, currently manic, mild     Past Surgical History  Procedure Laterality Date  . Abortion x 4    . Eye surgery    . Dilation and curettage of uterus      Family History  Problem Relation Age of Onset  . Hypertension Other   . Diabetes Other   . Emphysema Other   . Diabetes Maternal Grandmother   . Diabetes Paternal Grandfather   . Asthma Son     History  Substance Use Topics  . Smoking status: Current Every Day Smoker    Types: Cigarettes  . Smokeless tobacco: Not on file  . Alcohol Use: 3.0 oz/week    3 Shots of liquor, 2 Cans of beer per week     Comment: daily, currently 01/01/13    Allergies:  Allergies  Allergen Reactions  . Amoxil  (Amoxicillin) Anaphylaxis  . Trazodone And Nefazodone Anaphylaxis  . Eggs Or Egg-Derived Products Hives and Swelling  . Ketorolac Tromethamine Hives and Swelling  . Tegretol (Carbamazepine)     Swelling skin peeling    No prescriptions prior to admission    Review of Systems  Constitutional: Positive for malaise/fatigue. Negative for fever.  Gastrointestinal: Positive for nausea, vomiting, abdominal pain and constipation. Negative for diarrhea.  Genitourinary: Negative for dysuria, urgency and frequency.       Neg - vaginal bleeding, discharge  Neurological: Positive for dizziness and weakness. Negative for loss of consciousness.   Physical Exam   Blood pressure 125/93, pulse 79, resp. rate 16, height 5\' 4"  (1.626 m), weight 135 lb 12.8 oz (61.598 kg), last menstrual period 11/28/2012.  Physical Exam  Constitutional: She is oriented to person, place, and time. She appears well-developed and well-nourished. No distress.  HENT:  Head: Normocephalic and atraumatic.  Cardiovascular: Normal rate, regular rhythm and normal heart sounds.   Respiratory: Effort normal and breath sounds normal. No respiratory distress.  GI: Soft. Bowel sounds are normal. She exhibits no distension and no mass. There is  tenderness (moderate tenderness to palpation of the upper abdomen). There is no rebound and no guarding.  Neurological: She is alert and oriented to person, place, and time.  Skin: Skin is warm and dry. No erythema.  Psychiatric: She has a normal mood and affect.   Results for orders placed during the hospital encounter of 01/24/13 (from the past 24 hour(s))  URINALYSIS, ROUTINE W REFLEX MICROSCOPIC     Status: Abnormal   Collection Time    01/24/13 10:10 AM      Result Value Range   Color, Urine YELLOW  YELLOW   APPearance CLOUDY (*) CLEAR   Specific Gravity, Urine >1.030 (*) 1.005 - 1.030   pH 6.0  5.0 - 8.0   Glucose, UA NEGATIVE  NEGATIVE mg/dL   Hgb urine dipstick SMALL (*)  NEGATIVE   Bilirubin Urine NEGATIVE  NEGATIVE   Ketones, ur NEGATIVE  NEGATIVE mg/dL   Protein, ur NEGATIVE  NEGATIVE mg/dL   Urobilinogen, UA 0.2  0.0 - 1.0 mg/dL   Nitrite NEGATIVE  NEGATIVE   Leukocytes, UA TRACE (*) NEGATIVE  URINE MICROSCOPIC-ADD ON     Status: Abnormal   Collection Time    01/24/13 10:10 AM      Result Value Range   Squamous Epithelial / LPF MANY (*) RARE   WBC, UA 0-2  <3 WBC/hpf   Bacteria, UA MANY (*) RARE    MAU Course  Procedures None  MDM UA and [place] today 1 liter LR with phenergan infusion and 20 mg IV pepcid given Patient able to tolerate PO intake in MAU today Assessment and Plan  A: IUP at [redacted]w[redacted]d  Nausea and vomiting in pregnancy prior to [redacted] weeks gestation  P: Discharge home Rx for Phenergan sent to patient's pharmacy Advised patient to increase PO hydration as tolerated AVS contains information on hyperemesis diet Patient may return to MAU as needed or if her condition were to change or worsen  Christine Starr, PA-C  01/24/2013, 2:30 PM

## 2013-01-25 ENCOUNTER — Encounter: Payer: Medicare Other | Admitting: Obstetrics & Gynecology

## 2013-01-28 ENCOUNTER — Inpatient Hospital Stay (HOSPITAL_COMMUNITY): Payer: Medicare Other

## 2013-01-28 ENCOUNTER — Encounter (HOSPITAL_COMMUNITY): Payer: Self-pay | Admitting: *Deleted

## 2013-01-28 ENCOUNTER — Inpatient Hospital Stay (HOSPITAL_COMMUNITY)
Admission: AD | Admit: 2013-01-28 | Discharge: 2013-01-28 | Disposition: A | Discharge status: Home / Self Care | Payer: Medicare Other | Source: Ambulatory Visit | Attending: Family Medicine | Admitting: Family Medicine

## 2013-01-28 DIAGNOSIS — O368990 Maternal care for other specified fetal problems, unspecified trimester, not applicable or unspecified: Secondary | ICD-10-CM | POA: Diagnosis not present

## 2013-01-28 DIAGNOSIS — N831 Corpus luteum cyst of ovary, unspecified side: Secondary | ICD-10-CM | POA: Insufficient documentation

## 2013-01-28 DIAGNOSIS — O99891 Other specified diseases and conditions complicating pregnancy: Secondary | ICD-10-CM | POA: Diagnosis not present

## 2013-01-28 DIAGNOSIS — O9989 Other specified diseases and conditions complicating pregnancy, childbirth and the puerperium: Secondary | ICD-10-CM

## 2013-01-28 DIAGNOSIS — R109 Unspecified abdominal pain: Secondary | ICD-10-CM | POA: Diagnosis not present

## 2013-01-28 DIAGNOSIS — O26891 Other specified pregnancy related conditions, first trimester: Secondary | ICD-10-CM

## 2013-01-28 DIAGNOSIS — O34599 Maternal care for other abnormalities of gravid uterus, unspecified trimester: Secondary | ICD-10-CM | POA: Insufficient documentation

## 2013-01-28 DIAGNOSIS — N949 Unspecified condition associated with female genital organs and menstrual cycle: Secondary | ICD-10-CM | POA: Diagnosis not present

## 2013-01-28 DIAGNOSIS — O43899 Other placental disorders, unspecified trimester: Secondary | ICD-10-CM | POA: Diagnosis not present

## 2013-01-28 LAB — URINALYSIS, ROUTINE W REFLEX MICROSCOPIC
Ketones, ur: 40 mg/dL — AB
Leukocytes, UA: NEGATIVE
Nitrite: NEGATIVE
Protein, ur: 30 mg/dL — AB
pH: 7 (ref 5.0–8.0)

## 2013-01-28 LAB — WET PREP, GENITAL
Trich, Wet Prep: NONE SEEN
Yeast Wet Prep HPF POC: NONE SEEN

## 2013-01-28 MED ORDER — ACETAMINOPHEN 500 MG PO TABS
1000.0000 mg | ORAL_TABLET | Freq: Once | ORAL | Status: AC
  Administered 2013-01-28: 1000 mg via ORAL
  Filled 2013-01-28: qty 2

## 2013-01-28 NOTE — MAU Provider Note (Signed)
Chart reviewed and agree with management and plan.  

## 2013-01-28 NOTE — MAU Note (Signed)
Pt c/o cervical/uterine pain that starte 3 hrs ago. Sharp and constant. Denies bleeding or discharge at this time

## 2013-01-28 NOTE — MAU Provider Note (Signed)
History     CSN: [id number]  Arrival date and time: 01/28/13 1225   First Provider Initiated Contact with Patient 01/28/13 1305      No chief complaint on file.  HPI  Christine Cobb is a 37 y.o. [id number] at [redacted]w[redacted]d who presents today with 10/10 pelvic and lower abdominal pain. She states that the pain started about 3 hours ago. She denies any dysuria, vaginal bleeding or discharge. She has not taken anything for the pain at this time.   Past Medical History  Diagnosis Date  . Sinus tachycardia   . HTN (hypertension)   . Arm pain   . Eye globe prosthesis   . CHF (congestive heart failure)   . Bipolar affective disorder, currently manic, mild     Past Surgical History  Procedure Laterality Date  . Abortion x 4    . Eye surgery    . Dilation and curettage of uterus      Family History  Problem Relation Age of Onset  . Hypertension Other   . Diabetes Other   . Emphysema Other   . Diabetes Maternal Grandmother   . Diabetes Paternal Grandfather   . Asthma Son     History  Substance Use Topics  . Smoking status: Current Every Day Smoker    Types: Cigarettes  . Smokeless tobacco: Not on file  . Alcohol Use: 3.0 oz/week    2 Cans of beer, 3 Shots of liquor per week     Comment: daily, currently 01/01/13    Allergies:  Allergies  Allergen Reactions  . Amoxil (Amoxicillin) Anaphylaxis  . Trazodone And Nefazodone Anaphylaxis  . Eggs Or Egg-Derived Products Hives and Swelling  . Ketorolac Tromethamine Hives and Swelling  . Tegretol (Carbamazepine)     Swelling skin peeling    Prescriptions prior to admission  Medication Sig Dispense Refill  . polyethylene glycol (MIRALAX / GLYCOLAX) packet Take 17 g by mouth daily as needed (constipation).       . promethazine (PHENERGAN) 25 MG tablet Take 1 tablet (25 mg total) by mouth every 6 (six) hours as needed for nausea.  30 tablet  0  . traMADol (ULTRAM) 50 MG tablet Take 50 mg by mouth daily as needed (pain).       .  valACYclovir (VALTREX) 500 MG tablet Take 500 mg by mouth daily.      Marland Kitchen zolpidem (AMBIEN) 10 MG tablet Take 10 mg by mouth at bedtime as needed for sleep.        ROS Physical Exam   Blood pressure 123/79, pulse 91, temperature 98.1 F (36.7 C), temperature source Oral, resp. rate 18, last menstrual period 11/28/2012.  Physical Exam  Nursing note and vitals reviewed. Constitutional: She is oriented to person, place, and time. She appears well-developed and well-nourished. No distress.  Cardiovascular: Normal rate.   Respiratory: Effort normal.  GI: Soft. There is no tenderness.  Genitourinary:   External: no lesion Vagina: small amount of white discharge Cervix: pink, smooth, no CMT Uterus: 8 weeks size Adnexa: NT   Neurological: She is oriented to person, place, and time.  Skin: Skin is warm and dry.  Psychiatric: She has a normal mood and affect.    MAU Course  Procedures  Results for orders placed during the hospital encounter of 01/28/13 (from the past 24 hour(s))  WET PREP, GENITAL     Status: Abnormal   Collection Time    01/28/13  1:16 PM      Result  Value Range   Yeast Wet Prep HPF POC NONE SEEN  NONE SEEN   Trich, Wet Prep NONE SEEN  NONE SEEN   Clue Cells Wet Prep HPF POC MODERATE (*) NONE SEEN   WBC, Wet Prep HPF POC FEW (*) NONE SEEN  URINALYSIS, ROUTINE W REFLEX MICROSCOPIC     Status: Abnormal   Collection Time    01/28/13  2:20 PM      Result Value Range   Color, Urine YELLOW  YELLOW   APPearance CLEAR  CLEAR   Specific Gravity, Urine 1.015  1.005 - 1.030   pH 7.0  5.0 - 8.0   Glucose, UA NEGATIVE  NEGATIVE mg/dL   Hgb urine dipstick NEGATIVE  NEGATIVE   Bilirubin Urine NEGATIVE  NEGATIVE   Ketones, ur 40 (*) NEGATIVE mg/dL   Protein, ur 30 (*) NEGATIVE mg/dL   Urobilinogen, UA 1.0  0.0 - 1.0 mg/dL   Nitrite NEGATIVE  NEGATIVE   Leukocytes, UA NEGATIVE  NEGATIVE  URINE MICROSCOPIC-ADD ON     Status: Abnormal   Collection Time    01/28/13  2:20  PM      Result Value Range   Squamous Epithelial / LPF FEW (*) RARE   WBC, UA 3-6  <3 WBC/hpf   RBC / HPF 0-2  <3 RBC/hpf   Bacteria, UA FEW (*) RARE   US Ob Transvaginal  01/28/2013   *RADIOLOGY REPORT*  Clinical Data: Pelvic pain  TRANSVAGINAL OBSTETRIC US  Technique:  Transvaginal ultrasound was performed for complete evaluation of the gestation as well as the maternal uterus, adnexal regions, and pelvic cul-de-sac.  Comparison:  01/24/2013  Intrauterine gestational sac: Visualized/normal in shape. Yolk sac: Present Embryo: Present Cardiac Activity: Present Heart Rate: 170  CRL: 15.0           7   w  6   d [place] EDC: 09/11/2013 by prior ultrasound  Maternal uterus/adnexae: Moderate subchorionic hemorrhage.  Right ovary is within normal limits, measuring 1.9 x 4.0 x 2.3 cm, and is notable for a corpus luteal cyst.  Left ovary is within normal limits, measuring 1.0 x 3.1 x 1.4 cm.  No free fluid.  IMPRESSION: Single live intrauterine gestation, as described above.   Original Report Authenticated By: Charline Bills, M.D.     Assessment and Plan   1. Pelvic pain complicating pregnancy, antepartum, first trimester    1st trimester danger signs reviewed Has appointment to start [facility] on 8/7, keep that appointment Return to MAU as needed   Tawnya Crook 01/28/2013, 3:14 PM

## 2013-01-31 LAB — URINE CULTURE

## 2013-02-01 ENCOUNTER — Other Ambulatory Visit (HOSPITAL_COMMUNITY)
Admission: RE | Admit: 2013-02-01 | Discharge: 2013-02-01 | Disposition: A | Discharge status: Home / Self Care | Payer: Medicare Other | Source: Ambulatory Visit | Attending: Family Medicine | Admitting: Family Medicine

## 2013-02-01 ENCOUNTER — Encounter (HOSPITAL_COMMUNITY): Payer: Self-pay | Admitting: Family Medicine

## 2013-02-01 ENCOUNTER — Ambulatory Visit (INDEPENDENT_AMBULATORY_CARE_PROVIDER_SITE_OTHER): Payer: Medicare Other | Admitting: Family Medicine

## 2013-02-01 ENCOUNTER — Encounter: Payer: Self-pay | Admitting: Family Medicine

## 2013-02-01 VITALS — BP 132/94 | Temp 98.6°F | Wt 132.0 lb

## 2013-02-01 DIAGNOSIS — Z1151 Encounter for screening for human papillomavirus (HPV): Secondary | ICD-10-CM | POA: Insufficient documentation

## 2013-02-01 DIAGNOSIS — O10019 Pre-existing essential hypertension complicating pregnancy, unspecified trimester: Secondary | ICD-10-CM | POA: Diagnosis not present

## 2013-02-01 DIAGNOSIS — O10011 Pre-existing essential hypertension complicating pregnancy, first trimester: Secondary | ICD-10-CM

## 2013-02-01 DIAGNOSIS — O09529 Supervision of elderly multigravida, unspecified trimester: Secondary | ICD-10-CM | POA: Diagnosis not present

## 2013-02-01 DIAGNOSIS — Z8679 Personal history of other diseases of the circulatory system: Secondary | ICD-10-CM | POA: Diagnosis not present

## 2013-02-01 DIAGNOSIS — Z124 Encounter for screening for malignant neoplasm of cervix: Secondary | ICD-10-CM | POA: Insufficient documentation

## 2013-02-01 DIAGNOSIS — Z113 Encounter for screening for infections with a predominantly sexual mode of transmission: Secondary | ICD-10-CM | POA: Diagnosis not present

## 2013-02-01 DIAGNOSIS — O09521 Supervision of elderly multigravida, first trimester: Secondary | ICD-10-CM

## 2013-02-01 DIAGNOSIS — O0990 Supervision of high risk pregnancy, unspecified, unspecified trimester: Secondary | ICD-10-CM | POA: Insufficient documentation

## 2013-02-01 DIAGNOSIS — F319 Bipolar disorder, unspecified: Secondary | ICD-10-CM | POA: Insufficient documentation

## 2013-02-01 LAB — POCT URINALYSIS DIP (DEVICE)
Nitrite: NEGATIVE
Protein, ur: NEGATIVE mg/dL
Urobilinogen, UA: 0.2 mg/dL (ref 0.0–1.0)

## 2013-02-01 LAB — HIV ANTIBODY (ROUTINE TESTING W REFLEX): HIV: NONREACTIVE

## 2013-02-01 NOTE — Progress Notes (Signed)
Pulse 107 2nd BP: 129/90 C/o cramping in pelvic area.

## 2013-02-01 NOTE — Progress Notes (Signed)
Referral to [facility] scheduled for 02/27/13 at 11:10 am.  Patient will be seeing PA.  Is established patient in the office - previously seen by Dr. Eden Emms. MFM appointment for nuchal scheduled on 03/06/13 at 9 am.

## 2013-02-01 NOTE — Progress Notes (Signed)
Nutrition Note:  1st visit Pt. With hx of CHF and HTN.  Smokes 3 cigarettes/day (down from 1 ppd).   Poor appetite, N/V per patient.  Wt loss of 13# during pregnancy. Eats 1 meal/day.  Discussed importance of regular meals and snacks and wt gain goal of 25-35#  Pt. Does not intend to BF. Certified for [facility] today. F/U in 2-3 weeks. Candice C. Earlene Plater, MPH, RD, LDN

## 2013-02-01 NOTE — Patient Instructions (Addendum)
Pregnancy - First Trimester During sexual intercourse, millions of sperm go into the vagina. Only 1 sperm will penetrate and fertilize the female egg while it is in the Fallopian tube. One week later, the fertilized egg implants into the wall of the uterus. An embryo begins to develop into a baby. At 6 to 8 weeks, the eyes and face are formed and the heartbeat can be seen on ultrasound. At the end of 12 weeks (first trimester), all the baby's organs are formed. Now that you are pregnant, you will want to do everything you can to have a healthy baby. Two of the most important things are to get good prenatal care and follow your caregiver's instructions. Prenatal care is all the medical care you receive before the baby's birth. It is given to prevent, find, and treat problems during the pregnancy and childbirth. PRENATAL EXAMS  During prenatal visits, your weight, blood pressure, and urine are checked. This is done to make sure you are healthy and progressing normally during the pregnancy.  A pregnant woman should gain 25 to 35 pounds during the pregnancy. However, if you are overweight or underweight, your caregiver will advise you regarding your weight.  Your caregiver will ask and answer questions for you.  Blood work, cervical cultures, other necessary tests, and a Pap test are done during your prenatal exams. These tests are done to check on your health and the probable health of your baby. Tests are strongly recommended and done for HIV with your permission. This is the virus that causes AIDS. These tests are done because medicines can be given to help prevent your baby from being born with this infection should you have been infected without knowing it. Blood work is also used to find out your blood type, previous infections, and follow your blood levels (hemoglobin).  Low hemoglobin (anemia) is common during pregnancy. Iron and vitamins are given to help prevent this. Later in the pregnancy,  blood tests for diabetes will be done along with any other tests if any problems develop.  You may need other tests to make sure you and the baby are doing well. CHANGES DURING THE FIRST TRIMESTER  Your body goes through many changes during pregnancy. They vary from person to person. Talk to your caregiver about changes you notice and are concerned about. Changes can include:  Your menstrual period stops.  The egg and sperm carry the genes that determine what you look like. Genes from you and your partner are forming a baby. The female genes determine whether the baby is a boy or a girl.  Your body increases in girth and you may feel bloated.  Feeling sick to your stomach (nauseous) and throwing up (vomiting). If the vomiting is uncontrollable, call your caregiver.  Your breasts will begin to enlarge and become tender.  Your nipples may stick out more and become darker.  The need to urinate more. Painful urination may mean you have a bladder infection.  Tiring easily.  Loss of appetite.  Cravings for certain kinds of food.  At first, you may gain or lose a couple of pounds.  You may have changes in your emotions from day to day (excited to be pregnant or concerned something may go wrong with the pregnancy and baby).  You may have more vivid and strange dreams. HOME CARE INSTRUCTIONS   It is very important to avoid all smoking, alcohol and non-prescribed drugs during your pregnancy. These affect the formation and growth of the baby.   Avoid chemicals while pregnant to ensure the delivery of a healthy infant.  Start your prenatal visits by the 12th week of pregnancy. They are usually scheduled monthly at first, then more often in the last 2 months before delivery. Keep your caregiver's appointments. Follow your caregiver's instructions regarding medicine use, blood and lab tests, exercise, and diet.  During pregnancy, you are providing food for you and your baby. Eat regular,  well-balanced meals. Choose foods such as meat, fish, milk and other low fat dairy products, vegetables, fruits, and whole-grain breads and cereals. Your caregiver will tell you of the ideal weight gain.  You can help morning sickness by keeping soda crackers at the bedside. Eat a couple before arising in the morning. You may want to use the crackers without salt on them.  Eating 4 to 5 small meals rather than 3 large meals a day also may help the nausea and vomiting.  Drinking liquids between meals instead of during meals also seems to help nausea and vomiting.  A physical sexual relationship may be continued throughout pregnancy if there are no other problems. Problems may be early (premature) leaking of amniotic fluid from the membranes, vaginal bleeding, or belly (abdominal) pain.  Exercise regularly if there are no restrictions. Check with your caregiver or physical therapist if you are unsure of the safety of some of your exercises. Greater weight gain will occur in the last 2 trimesters of pregnancy. Exercising will help:  Control your weight.  Keep you in shape.  Prepare you for labor and delivery.  Help you lose your pregnancy weight after you deliver your baby.  Wear a good support or jogging bra for breast tenderness during pregnancy. This may help if worn during sleep too.  Ask when prenatal classes are available. Begin classes when they are offered.  Do not use hot tubs, steam rooms, or saunas.  Wear your seat belt when driving. This protects you and your baby if you are in an accident.  Avoid raw meat, uncooked cheese, cat litter boxes, and soil used by cats throughout the pregnancy. These carry germs that can cause birth defects in the baby.  The first trimester is a good time to visit your dentist for your dental health. Getting your teeth cleaned is okay. Use a softer toothbrush and brush gently during pregnancy.  Ask for help if you have financial, counseling, or  nutritional needs during pregnancy. Your caregiver will be able to offer counseling for these needs as well as refer you for other special needs.  Do not take any medicines or herbs unless told by your caregiver.  Inform your caregiver if there is any mental or physical domestic violence.  Make a list of emergency phone numbers of family, friends, hospital, and police and fire departments.  Write down your questions. Take them to your prenatal visit.  Do not douche.  Do not cross your legs.  If you have to stand for long periods of time, rotate you feet or take small steps in a circle.  You may have more vaginal secretions that may require a sanitary pad. Do not use tampons or scented sanitary pads. MEDICINES AND DRUG USE IN PREGNANCY  Take prenatal vitamins as directed. The vitamin should contain 1 milligram of folic acid. Keep all vitamins out of reach of children. Only a couple vitamins or tablets containing iron may be fatal to a baby or young child when ingested.  Avoid use of all medicines, including herbs, over-the-counter medicines, not   prescribed or suggested by your caregiver. Only take over-the-counter or prescription medicines for pain, discomfort, or fever as directed by your caregiver. Do not use aspirin, ibuprofen, or naproxen unless directed by your caregiver.  Let your caregiver also know about herbs you may be using.  Alcohol is related to a number of birth defects. This includes fetal alcohol syndrome. All alcohol, in any form, should be avoided completely. Smoking will cause low birth rate and premature babies.  Street or illegal drugs are very harmful to the baby. They are absolutely forbidden. A baby born to an addicted mother will be addicted at birth. The baby will go through the same withdrawal an adult does.  Let your caregiver know about any medicines that you have to take and for what reason you take them. SEEK MEDICAL CARE IF:  You have any concerns or  worries during your pregnancy. It is better to call with your questions if you feel they cannot wait, rather than worry about them. SEEK IMMEDIATE MEDICAL CARE IF:   An unexplained oral temperature above 102 F (38.9 C) develops, or as your caregiver suggests.  You have leaking of fluid from the vagina (birth canal). If leaking membranes are suspected, take your temperature and inform your caregiver of this when you call.  There is vaginal spotting or bleeding. Notify your caregiver of the amount and how many pads are used.  You develop a bad smelling vaginal discharge with a change in the color.  You continue to feel sick to your stomach (nauseated) and have no relief from remedies suggested. You vomit blood or coffee ground-like materials.  You lose more than 2 pounds of weight in 1 week.  You gain more than 2 pounds of weight in 1 week and you notice swelling of your face, hands, feet, or legs.  You gain 5 pounds or more in 1 week (even if you do not have swelling of your hands, face, legs, or feet).  You get exposed to German measles and have never had them.  You are exposed to fifth disease or chickenpox.  You develop belly (abdominal) pain. Round ligament discomfort is a common non-cancerous (benign) cause of abdominal pain in pregnancy. Your caregiver still must evaluate this.  You develop headache, fever, diarrhea, pain with urination, or shortness of breath.  You fall or are in a car accident or have any kind of trauma.  There is mental or physical violence in your home. Document Released: 06/08/2001 Document Revised: 03/08/2012 Document Reviewed: 12/10/2008 [employer] Patient Information 2014 [employer].  Breastfeeding A change in hormones during your pregnancy causes growth of your breast tissue and an increase in number and size of milk ducts. The hormone prolactin allows proteins, sugars, and fats from your blood supply to make breast milk in your milk-producing  glands. The hormone progesterone prevents breast milk from being released before the birth of your baby. After the birth of your baby, your progesterone level decreases allowing breast milk to be released. Thoughts of your baby, as well as his or her sucking or crying, can stimulate the release of milk from the milk-producing glands. Deciding to breastfeed (nurse) is one of the best choices you can make for you and your baby. The information that follows gives a brief review of the benefits, as well as other important skills to know about breastfeeding. BENEFITS OF BREASTFEEDING For your baby  The first milk (colostrum) helps your baby's digestive system function better.   There are antibodies in   your milk that help your baby fight off infections.   Your baby has a lower incidence of asthma, allergies, and sudden infant death syndrome (SIDS).   The nutrients in breast milk are better for your baby than infant formulas.  Breast milk improves your baby's brain development.   Your baby will have less gas, colic, and constipation.  Your baby is less likely to develop other conditions, such as childhood obesity, asthma, or diabetes mellitus. For you  Breastfeeding helps develop a very special bond between you and your baby.   Breastfeeding is convenient, always available at the correct temperature, and costs nothing.   Breastfeeding helps to burn calories and helps you lose the weight gained during pregnancy.   Breastfeeding makes your uterus contract back down to normal size faster and slows bleeding following delivery.   Breastfeeding mothers have a lower risk of developing osteoporosis or breast or ovarian cancer later in life.  BREASTFEEDING FREQUENCY  A healthy, full-term baby may breastfeed as often as every hour or space his or her feedings to every 3 hours. Breastfeeding frequency will vary from baby to baby.   Newborns should be fed no less than every 2 3 hours during  the day and every 4 5 hours during the night. You should breastfeed a minimum of 8 feedings in a 24 hour period.  Awaken your baby to breastfeed if it has been 3 4 hours since the last feeding.  Breastfeed when you feel the need to reduce the fullness of your breasts or when your newborn shows signs of hunger. Signs that your baby may be hungry include:  Increased alertness or activity.  Stretching.  Movement of the head from side to side.  Movement of the head and opening of the mouth when the corner of the mouth or Taber is stroked (rooting).  Increased sucking sounds, smacking lips, cooing, sighing, or squeaking.  Hand-to-mouth movements.  Increased sucking of fingers or hands.  Fussing.  Intermittent crying.  Signs of extreme hunger will require calming and consoling before you try to feed your baby. Signs of extreme hunger may include:  Restlessness.  A loud, strong cry.  Screaming.  Frequent feeding will help you make more milk and will help prevent problems, such as sore nipples and engorgement of the breasts.  BREASTFEEDING   Whether lying down or sitting, be sure that the baby's abdomen is facing your abdomen.   Support your breast with 4 fingers under your breast and your thumb above your nipple. Make sure your fingers are well away from your nipple and your baby's mouth.   Stroke your baby's lips gently with your finger or nipple.   When your baby's mouth is open wide enough, place all of your nipple and as much of the colored area around your nipple (areola) as possible into your baby's mouth.  More areola should be visible above his or her upper lip than below his or her lower lip.  Your baby's tongue should be between his or her lower gum and your breast.  Ensure that your baby's mouth is correctly positioned around the nipple (latched). Your baby's lips should create a seal on your breast.  Signs that your baby has effectively latched onto your  nipple include:  Tugging or sucking without pain.  Swallowing heard between sucks.  Absent click or smacking sound.  Muscle movement above and in front of his or her ears with sucking.  Your baby must suck about 2 3 minutes in   order to get your milk. Allow your baby to feed on each breast as long as he or she wants. Nurse your baby until he or she unlatches or falls asleep at the first breast, then offer the second breast.  Signs that your baby is full and satisfied include:  A gradual decrease in the number of sucks or complete cessation of sucking.  Falling asleep.  Extension or relaxation of his or her body.  Retention of a small amount of milk in his or her mouth.  Letting go of your breast by himself or herself.  Signs of effective breastfeeding in you include:  Breasts that have increased firmness, weight, and size prior to feeding.  Breasts that are softer after nursing.  Increased milk volume, as well as a change in milk consistency and color by the 5th day of breastfeeding.  Breast fullness relieved by breastfeeding.  Nipples are not sore, cracked, or bleeding.  If needed, break the suction by putting your finger into the corner of your baby's mouth and sliding your finger between his or her gums. Then, remove your breast from his or her mouth.  It is common for babies to spit up a small amount after a feeding.  Babies often swallow air during feeding. This can make babies fussy. Burping your baby between breasts can help with this.  Vitamin D supplements are recommended for babies who get only breast milk.  Avoid using a pacifier during your baby's first 4 6 weeks.  Avoid supplemental feedings of water, formula, or juice in place of breastfeeding. Breast milk is all the food your baby needs. It is not necessary for your baby to have water or formula. Your breasts will make more milk if supplemental feedings are avoided during the early weeks. HOW TO TELL  WHETHER YOUR BABY IS GETTING ENOUGH BREAST MILK Wondering whether or not your baby is getting enough milk is a common concern among mothers. You can be assured that your baby is getting enough milk if:   Your baby is actively sucking and you hear swallowing.   Your baby seems relaxed and satisfied after a feeding.   Your baby nurses at least 8 12 times in a 24 hour time period.  During the first 3 5 days of age:  Your baby is wetting at least 3 5 diapers in a 24 hour period. The urine should be clear and pale yellow.  Your baby is having at least 3 4 stools in a 24 hour period. The stool should be soft and yellow.  At 5 7 days of age, your baby is having at least 3 6 stools in a 24 hour period. The stool should be seedy and yellow by 5 days of age.  Your baby has a weight loss less than 7 10% during the first 3 days of age.  Your baby does not lose weight after 3 7 days of age.  Your baby gains 4 7 ounces each week after he or she is 4 days of age.  Your baby gains weight by 5 days of age and is back to birth weight within 2 weeks. ENGORGEMENT In the first week after your baby is born, you may experience extremely full breasts (engorgement). When engorged, your breasts may feel heavy, warm, or tender to the touch. Engorgement peaks within 24 48 hours after delivery of your baby.  Engorgement may be reduced by:  Continuing to breastfeed.  Increasing the frequency of breastfeeding.  Taking warm showers or   applying warm, moist heat to your breasts just before each feeding. This increases circulation and helps the milk flow.   Gently massaging your breast before and during the feedings. With your fingertips, massage from your chest wall towards your nipple in a circular motion.   Ensuring that your baby empties at least one breast at every feeding. It also helps to start the next feeding on the opposite breast.   Expressing breast milk by hand or by using a breast pump to  empty the breasts if your baby is sleepy, or not nursing well. You may also want to express milk if you are returning to work oryou feel you are getting engorged.  Ensuring your baby is latched on and positioned properly while breastfeeding. If you follow these suggestions, your engorgement should improve in 24 48 hours. If you are still experiencing difficulty, call your lactation consultant or caregiver.  CARING FOR YOURSELF Take care of your breasts.  Bathe or shower daily.   Avoid using soap on your nipples.   Wear a supportive bra. Avoid wearing underwire style bras.  Air dry your nipples for a 3 4minutes after each feeding.   Use only cotton bra pads to absorb breast milk leakage. Leaking of breast milk between feedings is normal.   Use only pure lanolin on your nipples after nursing. You do not need to wash it off before feeding your baby again. Another option is to express a few drops of breast milk and gently massage that milk into your nipples.  Continue breast self-awareness checks. Take care of yourself.  Eat healthy foods. Alternate 3 meals with 3 snacks.  Avoid foods that you notice affect your baby in a bad way.  Drink milk, fruit juice, and water to satisfy your thirst (about 8 glasses a day).   Rest often, relax, and take your prenatal vitamins to prevent fatigue, stress, and anemia.  Avoid chewing and smoking tobacco.  Avoid alcohol and drug use.  Take over-the-counter and prescribed medicine only as directed by your caregiver or pharmacist. You should always check with your caregiver or pharmacist before taking any new medicine, vitamin, or herbal supplement.  Know that pregnancy is possible while breastfeeding. If desired, talk to your caregiver about family planning and safe birth control methods that may be used while breastfeeding. SEEK MEDICAL CARE IF:   You feel like you want to stop breastfeeding or have become frustrated with  breastfeeding.  You have painful breasts or nipples.  Your nipples are cracked or bleeding.  Your breasts are red, tender, or warm.  You have a swollen area on either breast.  You have a fever or chills.  You have nausea or vomiting.  You have drainage from your nipples.  Your breasts do not become full before feedings by the 5th day after delivery.  You feel sad and depressed.  Your baby is too sleepy to eat well.  Your baby is having trouble sleeping.   Your baby is wetting less than 3 diapers in a 24 hour period.  Your baby has less than 3 stools in a 24 hour period.  Your baby's skin or the white part of his or her eyes becomes more yellow.   Your baby is not gaining weight by 5 days of age. MAKE SURE YOU:   Understand these instructions.  Will watch your condition.  Will get help right away if you are not doing well or get worse. Document Released: 06/14/2005 Document Revised: 03/08/2012 Document Reviewed:   01/19/2012 [employer] Patient Information 2014 [employer].  

## 2013-02-01 NOTE — Progress Notes (Signed)
Subjective:    Christine Cobb is a [id number] [redacted]w[redacted]d being seen today for her first obstetrical visit.  Her obstetrical history is significant for advanced maternal age, smoker and h/o crack cocaine use, h/o CHF-with ECHO showing EF of 55% with some hpokinesis at base, and HTN..  Pregnancy history fully reviewed.  Patient reports nausea and weight loss.Ceasar Mons Vitals:   02/01/13 0815  BP: 132/94  Temp: 98.6 F (37 C)  Weight: 132 lb (59.875 kg)    HISTORY: OB History   Grav Para Term Preterm Abortions TAB SAB Ect Mult Living   4 1 1  2 1 1   1      # Outc Date GA Lbr Len/2nd Wgt Sex Del Anes PTL Lv   1 TRM 7/99    M SVD None  Yes   2 SAB  [redacted]w[redacted]d          Comments: D & C   3 TAB            4 CUR              Past Medical History  Diagnosis Date  . Sinus tachycardia   . HTN (hypertension)   . Arm pain   . Eye globe prosthesis   . CHF (congestive heart failure)   . Bipolar affective disorder, currently manic, mild    Past Surgical History  Procedure Laterality Date  . Eye surgery    . Dilation and curettage of uterus     Family History  Problem Relation Age of Onset  . Hypertension Other   . Emphysema Other   . Asthma Son   . Diabetes Maternal Uncle   . Diabetes Paternal Grandmother      Exam    Uterus:   8 wk size  Pelvic Exam:    Perineum: Normal Perineum   Vulva: Bartholin'Cobb, Urethra, Skene'Cobb normal   Vagina:  normal mucosa, normal discharge   Cervix: no cervical motion tenderness and no lesions   Adnexa: normal adnexa   Bony Pelvis: average  System: Breast:  normal appearance, no masses or tenderness   Skin: normal coloration and turgor, no rashes    Neurologic: oriented   Extremities: normal strength, tone, and muscle mass   HEENT sclera clear, anicteric   Mouth/Teeth mucous membranes moist, pharynx normal without lesions   Neck supple   Cardiovascular: regular rate and rhythm, no murmurs or gallops   Respiratory:  appears well, vitals normal,  no respiratory distress, acyanotic, normal RR, ear and throat exam is normal, neck free of mass or lymphadenopathy, chest clear, no wheezing, crepitations, rhonchi, normal symmetric air entry   Abdomen: soft, non-tender; bowel sounds normal; no masses,  no organomegaly      Assessment:    Pregnancy: [id number] Patient Active Problem List   Diagnosis Date Noted  . Benign essential hypertension antepartum 02/01/2013    Priority: High  . Elderly multigravida with antepartum condition or complication 02/01/2013    Priority: Medium  . Bipolar 1 disorder 02/01/2013  . Abscess of abdominal wall 10/18/2012  . HTN (hypertension) 04/08/2011  . Tachycardia 04/08/2011        Plan:     Initial labs drawn. Prenatal vitamins. Problem list reviewed and updated. Genetic Screening discussed First Screen: ordered with Genetics and possible NIPS.  Ultrasound discussed; fetal survey: discussed.  Follow up in 4 weeks. Pt considering termination of pregnancy, however, she is mostly worried about her health and that of baby.  Understands  we cannot guarantee healthy outcome, but she should be able to have a pregnancy without too much trouble with her BP ok, on no meds.  Will await cards input.  Genetics for [facility].   Christine Cobb 02/01/2013

## 2013-02-02 ENCOUNTER — Encounter: Payer: Self-pay | Admitting: Family Medicine

## 2013-02-02 DIAGNOSIS — Z6791 Unspecified blood type, Rh negative: Secondary | ICD-10-CM | POA: Insufficient documentation

## 2013-02-02 DIAGNOSIS — O26899 Other specified pregnancy related conditions, unspecified trimester: Secondary | ICD-10-CM

## 2013-02-02 LAB — OBSTETRIC PANEL
Basophils Absolute: 0 10*3/uL (ref 0.0–0.1)
Eosinophils Relative: 0 % (ref 0–5)
Lymphocytes Relative: 21 % (ref 12–46)
Neutro Abs: 6.4 10*3/uL (ref 1.7–7.7)
Platelets: 295 10*3/uL (ref 150–400)
RDW: 13.4 % (ref 11.5–15.5)
Rubella: 3.23 Index — ABNORMAL HIGH (ref <0.90)
WBC: 8.9 10*3/uL (ref 4.0–10.5)

## 2013-02-03 LAB — CULTURE, OB URINE

## 2013-02-05 LAB — HEMOGLOBINOPATHY EVALUATION: Hgb F Quant: 0 % (ref 0.0–2.0)

## 2013-02-15 ENCOUNTER — Encounter: Payer: Self-pay | Admitting: Physician Assistant

## 2013-02-15 ENCOUNTER — Ambulatory Visit (INDEPENDENT_AMBULATORY_CARE_PROVIDER_SITE_OTHER): Payer: Medicare Other | Admitting: Physician Assistant

## 2013-02-15 VITALS — BP 123/90 | HR 95 | Ht 64.0 in | Wt 129.0 lb

## 2013-02-15 DIAGNOSIS — I10 Essential (primary) hypertension: Secondary | ICD-10-CM | POA: Diagnosis not present

## 2013-02-15 DIAGNOSIS — Z331 Pregnant state, incidental: Secondary | ICD-10-CM | POA: Diagnosis not present

## 2013-02-15 DIAGNOSIS — Z3490 Encounter for supervision of normal pregnancy, unspecified, unspecified trimester: Secondary | ICD-10-CM

## 2013-02-15 NOTE — Progress Notes (Signed)
[address] [place], [place]  [address] Phone: [phone] Fax:  [phone]  Date:  02/15/2013   ID:  Christine Cobb, DOB [birth year], MRN [id number]  PCP:  Burtis Junes, MD  Cardiologist:  Dr. Charlton Haws     History of Present Illness: Christine Cobb is a 37 y.o. female who returns for follow up.  She has a history of HTN, tachycardia and prior drug abuse. She was evaluated by Dr. Eden Emms 03/2011 for surgical clearance prior ENT surgery. Echocardiogram 03/2011: Posterior basal HK, mild LVH, EF 55%, normal wall motion.   She several weeks pregnant and is referred back for cardiac evaluation.  She is convinced she has a hx of CHF.  I have searched her chart completely and cannot find any evidence of this.  She had a visit to the [facility] ED in 9/13 with a reaction to Tegretol that was assoc with LE edema.  CXR was clear at that time.  She does not take Lasix on a regular basis.  She is off her BP meds since learning she is pregnant.  She denies significant dyspnea.  No chest pain, syncope, orthopnea, PND, edema.     Labs (9/13):  K 3.4, Cr 0.90 Labs (8/14):  Hgb 13.9  Wt Readings from Last 3 Encounters:  02/15/13 129 lb (58.514 kg)  02/01/13 132 lb (59.875 kg)  01/24/13 135 lb 12.8 oz (61.598 kg)     Past Medical History  Diagnosis Date  . Sinus tachycardia   . HTN (hypertension)   . Arm pain   . Eye globe prosthesis   . CHF (congestive heart failure)   . Bipolar affective disorder, currently manic, mild     Current Outpatient Prescriptions  Medication Sig Dispense Refill  . polyethylene glycol (MIRALAX / GLYCOLAX) packet Take 17 g by mouth daily as needed (constipation).       . promethazine (PHENERGAN) 25 MG tablet Take 1 tablet (25 mg total) by mouth every 6 (six) hours as needed for nausea.  30 tablet  0  . traMADol (ULTRAM) 50 MG tablet Take 50 mg by mouth daily as needed (pain).       . valACYclovir (VALTREX) 500 MG tablet Take 500 mg by  mouth daily.      Marland Kitchen zolpidem (AMBIEN) 10 MG tablet Take 10 mg by mouth at bedtime as needed for sleep.       No current facility-administered medications for this visit.    Allergies:    Allergies  Allergen Reactions  . Amoxil [Amoxicillin] Anaphylaxis  . Trazodone And Nefazodone Anaphylaxis  . Eggs Or Egg-Derived Products Hives and Swelling  . Ketorolac Tromethamine Hives and Swelling  . Tegretol [Carbamazepine]     Swelling skin peeling    Social History:  The patient  reports that she has been smoking Cigarettes.  She has been smoking about 0.25 packs per day. She has never used smokeless tobacco. She reports that she uses illicit drugs (Marijuana). She reports that she does not drink alcohol.   ROS:  Please see the history of present illness.   Notes fatigue with pregnancy.   All other systems reviewed and negative.   PHYSICAL EXAM: VS:  BP 123/90  Pulse 95  Ht 5\' 4"  (1.626 m)  Wt 129 lb (58.514 kg)  BMI 22.13 kg/m2  LMP 11/28/2012 Well nourished, well developed, in no acute distress HEENT: normal Neck: no JVD Vascular: no carotid bruits Cardiac:  normal S1, S2; RRR; no murmur Lungs:  clear to auscultation bilaterally, no wheezing, rhonchi or rales Abd: soft, nontender, no hepatomegaly Ext: no edema Skin: warm and dry Neuro:  CNs 2-12 intact, no focal abnormalities noted  EKG:  NSR, HR 95, normal axis, no acute changes     ASSESSMENT AND PLAN:  1. Intrauterine Pregnancy:  She is referred back to cardiology for evaluation in regards to her pregnancy.  I spent a great deal of time reviewing her chart (> 20 minutes).  She has no hx of CHF documented that I can locate.  She tells me she had an admission to [facility] in 2007 for CP.  But, she had no cardiac cath or dx of CHF at that time.  Of note, the patient became defensive as I tried to glean more information regarding her dx of CHF.  She had normal LVF in 2012 by echo.  She has no significant symptoms to suggest  CHF.  BP is controlled off medications.  She has no obvious contraindication to carrying a pregnancy to term from a cardiac standpoint.  I also reviewed her case today with Dr. Rollene Rotunda (DOD).  He agreed.  When I returned to the room to further discuss with the patient, she had left the office.   2. Hypertension:  BP is controlled.  She is off ARB.  She should remain off of this due to teratogenic potential.  Beta blockers can be used should she have uncontrolled BP during pregnancy. 3. Disposition:  As noted, the patient left the office before I could finish discussing her case with her.  She may return to cardiology for follow up as necessary.    Signed, Tereso Newcomer, PA-C  02/15/2013 3:21 PM

## 2013-02-22 ENCOUNTER — Encounter (HOSPITAL_COMMUNITY): Payer: Self-pay

## 2013-02-22 ENCOUNTER — Emergency Department (HOSPITAL_COMMUNITY)
Admission: EM | Admit: 2013-02-22 | Discharge: 2013-02-23 | Disposition: A | Discharge status: Home / Self Care | Payer: Medicare Other | Attending: Emergency Medicine | Admitting: Emergency Medicine

## 2013-02-22 DIAGNOSIS — O169 Unspecified maternal hypertension, unspecified trimester: Secondary | ICD-10-CM | POA: Insufficient documentation

## 2013-02-22 DIAGNOSIS — L089 Local infection of the skin and subcutaneous tissue, unspecified: Secondary | ICD-10-CM | POA: Diagnosis not present

## 2013-02-22 DIAGNOSIS — O99330 Smoking (tobacco) complicating pregnancy, unspecified trimester: Secondary | ICD-10-CM | POA: Insufficient documentation

## 2013-02-22 DIAGNOSIS — O9989 Other specified diseases and conditions complicating pregnancy, childbirth and the puerperium: Secondary | ICD-10-CM | POA: Insufficient documentation

## 2013-02-22 DIAGNOSIS — L738 Other specified follicular disorders: Secondary | ICD-10-CM | POA: Insufficient documentation

## 2013-02-22 DIAGNOSIS — L739 Follicular disorder, unspecified: Secondary | ICD-10-CM

## 2013-02-22 DIAGNOSIS — Z79899 Other long term (current) drug therapy: Secondary | ICD-10-CM | POA: Insufficient documentation

## 2013-02-22 DIAGNOSIS — Z8659 Personal history of other mental and behavioral disorders: Secondary | ICD-10-CM | POA: Insufficient documentation

## 2013-02-22 DIAGNOSIS — Z8679 Personal history of other diseases of the circulatory system: Secondary | ICD-10-CM | POA: Insufficient documentation

## 2013-02-22 DIAGNOSIS — I509 Heart failure, unspecified: Secondary | ICD-10-CM | POA: Insufficient documentation

## 2013-02-22 DIAGNOSIS — L0231 Cutaneous abscess of buttock: Secondary | ICD-10-CM | POA: Insufficient documentation

## 2013-02-22 LAB — POCT PREGNANCY, URINE: Preg Test, Ur: POSITIVE — AB

## 2013-02-22 NOTE — ED Notes (Signed)
Pt complains of a boil on her right buttock 

## 2013-02-22 NOTE — ED Notes (Signed)
Pt states she is 2 months pregnant, no complaints with the baby

## 2013-02-23 LAB — URINALYSIS, ROUTINE W REFLEX MICROSCOPIC
Glucose, UA: NEGATIVE mg/dL
Hgb urine dipstick: NEGATIVE
Protein, ur: NEGATIVE mg/dL
Specific Gravity, Urine: 1.013 (ref 1.005–1.030)
pH: 6 (ref 5.0–8.0)

## 2013-02-23 LAB — URINE MICROSCOPIC-ADD ON

## 2013-02-23 MED ORDER — SULFAMETHOXAZOLE-TRIMETHOPRIM 800-160 MG PO TABS: 1.0000 | ORAL_TABLET | Freq: Two times a day (BID) | ORAL | Status: DC

## 2013-02-23 MED ORDER — CLINDAMYCIN HCL 300 MG PO CAPS: 300.0000 mg | ORAL_CAPSULE | Freq: Four times a day (QID) | ORAL | Status: DC

## 2013-02-23 NOTE — ED Provider Notes (Signed)
CSN: [id number]     Arrival date & time 02/22/13  2300 History   First MD Initiated Contact with Patient 02/22/13 2353     Chief Complaint  Patient presents with  . Recurrent Skin Infections    The history is provided by the patient.   patient presents with developing folliculitis and abscess of her right buttock.  She states been there for 48 hours.  She states she drained a significant amount of fluid out of it.  She denies fevers and chills.  No other complaints.  The patient is currently in her first trimester pregnancy,.  She has OB/GYN.  Past Medical History  Diagnosis Date  . Sinus tachycardia   . HTN (hypertension)   . Arm pain   . Eye globe prosthesis   . CHF (congestive heart failure)   . Bipolar affective disorder, currently manic, mild    Past Surgical History  Procedure Laterality Date  . Eye surgery    . Dilation and curettage of uterus     Family History  Problem Relation Age of Onset  . Hypertension Other   . Emphysema Other   . Asthma Son   . Diabetes Maternal Uncle   . Diabetes Paternal Grandmother    History  Substance Use Topics  . Smoking status: Current Every Day Smoker -- 0.25 packs/day    Types: Cigarettes  . Smokeless tobacco: Never Used  . Alcohol Use: No     Comment: daily, currently 01/01/13   OB History   Grav Para Term Preterm Abortions TAB SAB Ect Mult Living   4 1 1  2 1 1   1      Review of Systems  All other systems reviewed and are negative.    Allergies  Amoxil; Trazodone and nefazodone; Eggs or egg-derived products; Ketorolac tromethamine; and Tegretol  Home Medications   Current Outpatient Rx  Name  Route  Sig  Dispense  Refill  . promethazine (PHENERGAN) 25 MG tablet   Oral   Take 1 tablet (25 mg total) by mouth every 6 (six) hours as needed for nausea.   30 tablet   0   . valACYclovir (VALTREX) 500 MG tablet   Oral   Take 500 mg by mouth daily.         Marland Kitchen zolpidem (AMBIEN) 10 MG tablet   Oral   Take 10 mg by  mouth at bedtime as needed for sleep.         Marland Kitchen sulfamethoxazole-trimethoprim (SEPTRA DS) 800-160 MG per tablet   Oral   Take 1 tablet by mouth every 12 (twelve) hours.   10 tablet   0    BP 114/71  Pulse 66  Temp(Src) 98.6 F (37 C) (Oral)  Resp 20  SpO2 100%  LMP 11/28/2012 Physical Exam  Nursing note and vitals reviewed. Constitutional: She is oriented to person, place, and time. She appears well-developed and well-nourished. No distress.  HENT:  Head: Normocephalic and atraumatic.  Eyes: EOM are normal.  Neck: Normal range of motion.  Cardiovascular: Normal rate, regular rhythm and normal heart sounds.   Pulmonary/Chest: Effort normal and breath sounds normal.  Abdominal: Soft. She exhibits no distension. There is no tenderness.  Genitourinary:  Right by Dr. with small folliculitis without fluctuance or erythema surrounding it.  There is no drainable abscess present.  Musculoskeletal: Normal range of motion.  Neurological: She is alert and oriented to person, place, and time.  Skin: Skin is warm and dry.  Psychiatric: She  has a normal mood and affect. Judgment normal.    ED Course  Procedures (including critical care time) Labs Review Labs Reviewed  URINALYSIS, ROUTINE W REFLEX MICROSCOPIC - Abnormal; Notable for the following:    APPearance CLOUDY (*)    Leukocytes, UA SMALL (*)    All other components within normal limits  URINE MICROSCOPIC-ADD ON - Abnormal; Notable for the following:    Squamous Epithelial / LPF FEW (*)    Bacteria, UA FEW (*)    All other components within normal limits  POCT PREGNANCY, URINE - Abnormal; Notable for the following:    Preg Test, Ur POSITIVE (*)    All other components within normal limits  URINE CULTURE   Imaging Review No results found.  MDM   1. Folliculitis    This is very small this time.  She had some drainage at home.  Does not appear to be fluctuant mass at this time that requires additional incision and  drainage.  Patient be sent home on warm compresses and antibiotics.    Lyanne Co, MD 02/23/13 [phone]

## 2013-02-24 LAB — URINE CULTURE

## 2013-02-27 ENCOUNTER — Ambulatory Visit: Payer: Medicare Other | Admitting: Physician Assistant

## 2013-02-27 ENCOUNTER — Encounter: Payer: Self-pay | Admitting: *Deleted

## 2013-03-01 ENCOUNTER — Ambulatory Visit (INDEPENDENT_AMBULATORY_CARE_PROVIDER_SITE_OTHER): Payer: Medicare Other | Admitting: Family

## 2013-03-01 ENCOUNTER — Encounter: Payer: Self-pay | Admitting: Family

## 2013-03-01 VITALS — BP 124/84 | Temp 96.6°F | Wt 127.8 lb

## 2013-03-01 DIAGNOSIS — O10019 Pre-existing essential hypertension complicating pregnancy, unspecified trimester: Secondary | ICD-10-CM

## 2013-03-01 DIAGNOSIS — Z8679 Personal history of other diseases of the circulatory system: Secondary | ICD-10-CM

## 2013-03-01 DIAGNOSIS — O10011 Pre-existing essential hypertension complicating pregnancy, first trimester: Secondary | ICD-10-CM

## 2013-03-01 DIAGNOSIS — O0992 Supervision of high risk pregnancy, unspecified, second trimester: Secondary | ICD-10-CM

## 2013-03-01 LAB — POCT URINALYSIS DIP (DEVICE)
Bilirubin Urine: NEGATIVE
Hgb urine dipstick: NEGATIVE
Ketones, ur: NEGATIVE mg/dL
Specific Gravity, Urine: 1.02 (ref 1.005–1.030)
pH: 7.5 (ref 5.0–8.0)

## 2013-03-01 LAB — OB RESULTS CONSOLE GBS: STREP GROUP B AG: NEGATIVE

## 2013-03-01 MED ORDER — NITROFURANTOIN MONOHYD MACRO 100 MG PO CAPS: 100.0000 mg | ORAL_CAPSULE | Freq: Two times a day (BID) | ORAL | Status: DC

## 2013-03-01 MED ORDER — CONCEPT DHA 53.5-38-1 MG PO CAPS: 1.0000 | ORAL_CAPSULE | Freq: Every day | ORAL | Status: DC

## 2013-03-01 NOTE — Progress Notes (Signed)
Pulse- 108 Pt reports burning with urination

## 2013-03-01 NOTE — Progress Notes (Signed)
Reports going to see cardiologist 02/27/13; notes are not in Epic, will call; pt upset due to encounter with Jeanetta, listened to her concerns and proceeded with visit.  Nuchal scheduled for 03/06/13. RX for Macrobid due to dysuria and small leuk > urine culture sent to lab.  Visit with nutrition to discuss concerns with weight loss.

## 2013-03-01 NOTE — Progress Notes (Signed)
Nutrition note: f/u for wt loss Pt has lost 17.2# @ 103w2d. Pt reports her N/V are better and is eating 1-2 meals and ~4 snacks/d (fruit or cookies). Pt has not started taking PNV yet and expressed concern with ability to tolerate them. Pt received verbal & written education on general nutrition during pregnancy. Also provided handout Gain more wt during pregnancy and encouraged pt to include a protein source with all meals and snacks. Encouraged PNV or 2 chewable multivitamins. Discussed wt gain goals of 25-35# or 1#/wk. Pt agrees to start PNV or equivalent and consume energy dense snacks. Pt stated she is thinking about BF. F/u in 4-6 wks Blondell Reveal, MS, RD, LDN

## 2013-03-02 ENCOUNTER — Other Ambulatory Visit: Payer: Self-pay | Admitting: Family Medicine

## 2013-03-02 DIAGNOSIS — Z3682 Encounter for antenatal screening for nuchal translucency: Secondary | ICD-10-CM

## 2013-03-03 LAB — CULTURE, OB URINE

## 2013-03-04 ENCOUNTER — Inpatient Hospital Stay (HOSPITAL_COMMUNITY): Payer: Medicare Other

## 2013-03-04 ENCOUNTER — Inpatient Hospital Stay (HOSPITAL_COMMUNITY)
Admission: AD | Admit: 2013-03-04 | Discharge: 2013-03-04 | Disposition: A | Discharge status: Home / Self Care | Payer: Medicare Other | Source: Ambulatory Visit | Attending: Obstetrics & Gynecology | Admitting: Obstetrics & Gynecology

## 2013-03-04 ENCOUNTER — Encounter (HOSPITAL_COMMUNITY): Payer: Self-pay

## 2013-03-04 DIAGNOSIS — O209 Hemorrhage in early pregnancy, unspecified: Secondary | ICD-10-CM | POA: Diagnosis not present

## 2013-03-04 DIAGNOSIS — N898 Other specified noninflammatory disorders of vagina: Secondary | ICD-10-CM

## 2013-03-04 DIAGNOSIS — O368990 Maternal care for other specified fetal problems, unspecified trimester, not applicable or unspecified: Secondary | ICD-10-CM | POA: Diagnosis not present

## 2013-03-04 DIAGNOSIS — N939 Abnormal uterine and vaginal bleeding, unspecified: Secondary | ICD-10-CM

## 2013-03-04 DIAGNOSIS — B9689 Other specified bacterial agents as the cause of diseases classified elsewhere: Secondary | ICD-10-CM

## 2013-03-04 DIAGNOSIS — O418X20 Other specified disorders of amniotic fluid and membranes, second trimester, not applicable or unspecified: Secondary | ICD-10-CM

## 2013-03-04 LAB — CBC
Hemoglobin: 11.7 g/dL — ABNORMAL LOW (ref 12.0–15.0)
Platelets: 296 10*3/uL (ref 150–400)
RBC: 4.15 MIL/uL (ref 3.87–5.11)

## 2013-03-04 LAB — WET PREP, GENITAL

## 2013-03-04 MED ORDER — OXYCODONE-ACETAMINOPHEN 5-325 MG PO TABS
1.0000 | ORAL_TABLET | ORAL | Status: AC
  Administered 2013-03-04: 1 via ORAL
  Filled 2013-03-04: qty 1

## 2013-03-04 MED ORDER — PRENATAL VITAMINS 28-0.8 MG PO TABS: 1.0000 | ORAL_TABLET | Freq: Every day | ORAL | Status: DC

## 2013-03-04 MED ORDER — METRONIDAZOLE 500 MG PO TABS: 500.0000 mg | ORAL_TABLET | Freq: Two times a day (BID) | ORAL | Status: AC

## 2013-03-04 NOTE — MAU Provider Note (Signed)
Chief Complaint: Vaginal Discharge   First Provider Initiated Contact with Patient 03/04/13 1503     SUBJECTIVE HPI: Christine Cobb is a 37 y.o. G4P1021 at [redacted]w[redacted]d by LMP who presents to maternity admissions reporting vaginal spotting which she describes as blood mixed with fluid and abdominal pain described as cramping 6/10 pain.  She was taking Vicodin and Xanax prior to her pregnancy for chronic knee pain and anxiety but stopped taking these when she found out she was pregnant.  She also reports a 25 lb weight loss since the beginning of the pregnancy but reports she is eating regularly.  She has had thyroid problems in the past but is not sure when this was last checked.  She denies vaginal itching/burning, urinary symptoms, h/a, dizziness, n/v, or fever/chills.     Past Medical History  Diagnosis Date  . Sinus tachycardia   . HTN (hypertension)   . Arm pain   . Eye globe prosthesis   . CHF (congestive heart failure)   . Bipolar affective disorder, currently manic, mild    Past Surgical History  Procedure Laterality Date  . Eye surgery    . Dilation and curettage of uterus     History   Social History  . Marital Status: Single    Spouse Name: N/A    Number of Children: N/A  . Years of Education: N/A   Occupational History  . Not on file.   Social History Main Topics  . Smoking status: Current Every Day Smoker -- 0.25 packs/day    Types: Cigarettes  . Smokeless tobacco: Never Used  . Alcohol Use: No     Comment: daily, currently 01/01/13  . Drug Use: Yes    Special: Marijuana     Comment: every other day, current 01/01/13  . Sexual Activity: Yes   Other Topics Concern  . Not on file   Social History Narrative  . No narrative on file   No current facility-administered medications on file prior to encounter.   Current Outpatient Prescriptions on File Prior to Encounter  Medication Sig Dispense Refill  . nitrofurantoin, macrocrystal-monohydrate, (MACROBID) 100 MG  capsule Take 1 capsule (100 mg total) by mouth 2 (two) times daily.  14 capsule  0  . promethazine (PHENERGAN) 25 MG tablet Take 1 tablet (25 mg total) by mouth every 6 (six) hours as needed for nausea.  30 tablet  0  . valACYclovir (VALTREX) 500 MG tablet Take 500 mg by mouth daily.      Marland Kitchen zolpidem (AMBIEN) 10 MG tablet Take 10 mg by mouth at bedtime as needed for sleep.       Allergies  Allergen Reactions  . Amoxil [Amoxicillin] Anaphylaxis  . Trazodone And Nefazodone Anaphylaxis  . Eggs Or Egg-Derived Products Hives and Swelling  . Ketorolac Tromethamine Hives and Swelling  . Tegretol [Carbamazepine]     Swelling skin peeling    ROS: Pertinent items in HPI  OBJECTIVE Blood pressure 124/84, pulse 90, temperature 97.8 F (36.6 C), temperature source Oral, resp. rate 18, height 5\' 4"  (1.626 m), last menstrual period 11/28/2012. GENERAL: Well-developed, well-nourished female in no acute distress.  HEENT: Normocephalic HEART: normal rate RESP: normal effort ABDOMEN: Soft, non-tender EXTREMITIES: Nontender, no edema NEURO: Alert and oriented Pelvic exam: Cervix pink, visually closed, without lesion, moderate amount thin white discharge, vaginal walls and external genitalia normal Bimanual exam: Cervix 0/long/high, firm, anterior  FHT 165 by doppler  LAB RESULTS Results for orders placed during the hospital encounter of  03/04/13 (from the past 168 hour(s))  CBC   Collection Time    03/04/13  2:34 PM      Result Value Range   WBC 12.4 (*) 4.0 - 10.5 K/uL   RBC 4.15  3.87 - 5.11 MIL/uL   Hemoglobin 11.7 (*) 12.0 - 15.0 g/dL   HCT 16.1 (*) 09.6 - 04.5 %   MCV 81.2  78.0 - 100.0 fL   MCH 28.2  26.0 - 34.0 pg   MCHC 34.7  30.0 - 36.0 g/dL   RDW 40.9  81.1 - 91.4 %   Platelets 296  150 - 400 K/uL  TSH   Collection Time    03/04/13  2:34 PM      Result Value Range   TSH <0.008 (*) 0.350 - 4.500 uIU/mL  WET PREP, GENITAL   Collection Time    03/04/13  3:15 PM      Result  Value Range   Yeast Wet Prep HPF POC NONE SEEN  NONE SEEN   Trich, Wet Prep NONE SEEN  NONE SEEN   Clue Cells Wet Prep HPF POC MODERATE (*) NONE SEEN   WBC, Wet Prep HPF POC FEW (*) NONE SEEN  GC/CHLAMYDIA PROBE AMP   Collection Time    03/04/13  3:15 PM      Result Value Range   CT Probe RNA NEGATIVE  NEGATIVE   GC Probe RNA NEGATIVE  NEGATIVE     IMAGING US Ob Comp Less 14 Wks  03/04/2013   CLINICAL DATA:  Vaginal bleeding. Subchorionic hemorrhage. First trimester pregnancy.  EXAM: OBSTETRIC <14 WK ULTRASOUND  TECHNIQUE: Transabdominal ultrasound was performed for evaluation of the gestation as well as the maternal uterus and adnexal regions.  COMPARISON:  01/28/2013  FINDINGS: Intrauterine gestational sac: Visualized/normal in shape.  Yolk sac:  Present  Embryo:  Present  Cardiac Activity: Present  Heart Rate: 160 bpm  CRL: 6.4 V mm 12 w 6 d [place] EDC: 09/11/2013 (established)  Maternal uterus/adnexae: Small subchorionic hemorrhage, 2.4 x 1.4 x 0.9 cm. Ovaries unremarkable. No free pelvic fluid.  IMPRESSION: 1. Single living intrauterine pregnancy measuring at 12 weeks 6 days gestation. Small subchorionic hemorrhage as noted above.   Electronically Signed   By: Herbie Baltimore   On: 03/04/2013 16:23      ASSESSMENT 1. Vaginal spotting   2. Subchorionic hemorrhage in second trimester   3. Bacterial vaginosis     PLAN Percocet 5/325 x1 tab in MAU Discharge home Discussed [facility] as possible source of bleeding, usual resolution in pregnancy, reassurance provided Flagyl BID x7 days Continue Macrobid as prescribed TSH pending Pt unable to find a ride home--waiting in MAU 4 hours after narcotic to drive Regular diet--Ok to order a meal while waiting Keep scheduled U/S and prenatal appointments this week Return to MAU as needed    Medication List    STOP taking these medications       CONCEPT DHA 53.5-38-1 MG Caps      TAKE these medications       metroNIDAZOLE 500 MG tablet   Commonly known as:  FLAGYL  Take 1 tablet (500 mg total) by mouth 2 (two) times daily.     nitrofurantoin (macrocrystal-monohydrate) 100 MG capsule  Commonly known as:  MACROBID  Take 1 capsule (100 mg total) by mouth 2 (two) times daily.     Prenatal Vitamins 28-0.8 MG Tabs  Take 1 tablet by mouth daily.     promethazine 25 MG tablet  Commonly known as:  PHENERGAN  Take 1 tablet (25 mg total) by mouth every 6 (six) hours as needed for nausea.     valACYclovir 500 MG tablet  Commonly known as:  VALTREX  Take 500 mg by mouth daily.     zolpidem 10 MG tablet  Commonly known as:  AMBIEN  Take 10 mg by mouth at bedtime as needed for sleep.       Follow-up Information   Follow up with [facility]. (As scheduled)    Specialty:  Obstetrics and Gynecology   Contact information:   [address] [place] [address] [phone]      Sharen Counter Certified Nurse-Midwife 03/09/2013  9:49 PM

## 2013-03-04 NOTE — MAU Note (Signed)
Pt states noticed clear fluid leaking with red tinges in it since yesterday. Denies pain at present. Felt pulling in abdominal area earlier. Was having shortness of breath, not present now.

## 2013-03-05 LAB — TSH: TSH: <0.008 u[IU]/mL — ABNORMAL LOW (ref 0.350–4.500)

## 2013-03-05 LAB — GC/CHLAMYDIA PROBE AMP
CT Probe RNA: NEGATIVE
GC Probe RNA: NEGATIVE

## 2013-03-06 ENCOUNTER — Ambulatory Visit (HOSPITAL_COMMUNITY)
Admission: RE | Admit: 2013-03-06 | Discharge: 2013-03-06 | Disposition: A | Discharge status: Home / Self Care | Payer: Medicare Other | Source: Ambulatory Visit | Attending: Family Medicine | Admitting: Family Medicine

## 2013-03-06 ENCOUNTER — Ambulatory Visit (HOSPITAL_COMMUNITY): Admission: RE | Admit: 2013-03-06 | Payer: Medicare Other | Source: Ambulatory Visit

## 2013-03-06 ENCOUNTER — Other Ambulatory Visit: Payer: Self-pay | Admitting: Advanced Practice Midwife

## 2013-03-06 ENCOUNTER — Other Ambulatory Visit: Payer: Medicare Other

## 2013-03-06 ENCOUNTER — Encounter: Payer: Self-pay | Admitting: Family Medicine

## 2013-03-06 ENCOUNTER — Other Ambulatory Visit: Payer: Self-pay

## 2013-03-06 DIAGNOSIS — R7989 Other specified abnormal findings of blood chemistry: Secondary | ICD-10-CM

## 2013-03-06 DIAGNOSIS — Z3689 Encounter for other specified antenatal screening: Secondary | ICD-10-CM | POA: Diagnosis not present

## 2013-03-06 DIAGNOSIS — O351XX0 Maternal care for (suspected) chromosomal abnormality in fetus, not applicable or unspecified: Secondary | ICD-10-CM | POA: Diagnosis not present

## 2013-03-06 DIAGNOSIS — O09529 Supervision of elderly multigravida, unspecified trimester: Secondary | ICD-10-CM | POA: Insufficient documentation

## 2013-03-06 DIAGNOSIS — O10019 Pre-existing essential hypertension complicating pregnancy, unspecified trimester: Secondary | ICD-10-CM | POA: Insufficient documentation

## 2013-03-06 DIAGNOSIS — O99330 Smoking (tobacco) complicating pregnancy, unspecified trimester: Secondary | ICD-10-CM | POA: Diagnosis not present

## 2013-03-06 DIAGNOSIS — R946 Abnormal results of thyroid function studies: Secondary | ICD-10-CM | POA: Diagnosis not present

## 2013-03-06 DIAGNOSIS — Z36 Encounter for antenatal screening of mother: Secondary | ICD-10-CM | POA: Diagnosis not present

## 2013-03-06 DIAGNOSIS — Z3682 Encounter for antenatal screening for nuchal translucency: Secondary | ICD-10-CM

## 2013-03-06 DIAGNOSIS — O3510X0 Maternal care for (suspected) chromosomal abnormality in fetus, unspecified, not applicable or unspecified: Secondary | ICD-10-CM | POA: Insufficient documentation

## 2013-03-06 NOTE — Progress Notes (Signed)
TSH level drawn 03/05/13 is low.  Pt needs Free T3, T4 drawn in clinic today.  Appt made in clinic.  Left message for pt on her cell phone and with MFM (she is scheduled for U/S today).

## 2013-03-06 NOTE — Progress Notes (Addendum)
Genetic Counseling  High-Risk Gestation Note  Appointment Date:  03/06/2013 Referred By: Christine Bores, MD Date of Birth:  1976-05-12  Pregnancy History: [id number] Estimated Date of Delivery: 09/11/13 Estimated Gestational Age: [redacted]w[redacted]d Attending: Particia Nearing, MD   [name] was seen for genetic counseling because of a maternal age of 37.  The patient will be 37 in 10 days.  She was counseled regarding maternal age and the association with risk for chromosome conditions due to nondisjunction with aging of the ova.   We reviewed chromosomes, nondisjunction, and the associated 1 in 125 risk for fetal aneuploidy related to a maternal age of 42 at [redacted]w[redacted]d gestation.  She was counseled that the risk for aneuploidy decreases as gestational age increases, accounting for those pregnancies which spontaneously abort.  We specifically discussed Down syndrome (trisomy 5), trisomies 14 and 60, and sex chromosome aneuploidies (47,XXX and 47,XXY) including the common features and prognoses of each.   We reviewed available screening options including First Screen, Quad screen, noninvasive prenatal screening (NIPS)/cell free fetal DNA (cffDNA) testing, and detailed ultrasound.  She was counseled that screening tests are used to modify a patient's a priori risk for aneuploidy, typically based on age. This estimate provides a pregnancy specific risk assessment. We reviewed the benefits and limitations of each option. Specifically, we discussed the conditions for which each test screens, the detection rates, and false positive rates of each. She was also counseled regarding diagnostic testing via CVS and amniocentesis. We reviewed the associated risk of complications, including spontaneous pregnancy loss. After consideration of all the options, she elected to proceed with NIPS.  Those results will be available in 8-10 days.  She also expressed interest in pursuing a nuchal translucency ultrasound, which was  performed today.  The report will be documented separately.  The patient would like to return for a detailed ultrasound at ~18+ weeks gestation.  This appointment was scheduled today.  She understands that screening tests cannot rule out all birth defects or genetic syndromes. The patient was advised of this limitation and states she still does not want additional testing at this time.   [name] reported that the father of the baby is 36 years old.  She was counseled that advanced paternal age (APA) is defined as paternal age greater than or equal to age 18.  Recent large-scale sequencing studies have shown that approximately 80% of de novo point mutations are of paternal origin.  Many studies have demonstrated a strong correlation between increased paternal age and de novo point mutations.  Although no specific data is available regarding fetal risks for fathers 57+ years old at conception, it is apparent that the overall risk for single gene conditions is increased.  To estimate the relative increase in risk of a genetic disorder with APA, the heritability of the disease must be considered.  Assuming an approximate 2x increase in risk for conditions that are exclusively paternal in origin, the risk for each individual condition is still relatively low.  It is estimated that the overall chance for a de novo mutation is ~0.5%.  We also discussed the wide range of conditions which can be caused by new dominant gene mutations (achondroplasia, neurofibromatosis, Marfan syndrome etc.).  She was counseled that genetic testing for each individual single gene condition is not warranted or available unless ultrasound or family history concerns lend suspicion to a specific condition.   We discussed the recommendation for a follow up ultrasound at ~28+weeks to monitor fetal growth.  We discussed that newer literature suggests that the risk for autism spectrum disorders (ASD) may be increased in children born to fathers  of APA.  We discussed that ASDs are among the most common neurodevelopmental disorders, with approximately 1 in 85 children meeting criteria for ASD.  Approximately 80% of individuals diagnosed are female.  There is strong evidence that genetic factors play a critical role in development of ASD.  While there have been recent advances in identifying specific genetic causes of ASD, there are still many individuals for whom the etiology of the ASD is not known.  They understand that at this time there is no reliable, comprehensive genetic testing available for ASD.     [name] was provided with written information regarding sickle cell anemia (SCA) including the carrier frequency and incidence in the African-American population, the availability of carrier testing and prenatal diagnosis if indicated.  In addition, we discussed that hemoglobinopathies are routinely screened for as part of the [place] newborn screening panel.  We reviewed [name]'s normal hemoglobin electrophoresis results.  Due to patient time constraints, both family histories were only briefly reviewed.  There is no known history of birth defects, mental retardation, or known genetic conditions.  Without further information regarding the provided family history, an accurate genetic risk cannot be calculated. Further genetic counseling is warranted if more information is obtained.  [name] denied exposure to environmental toxins or chemical agents. She denied the use of alcohol and street drugs. She reported that she smokes <1/2 ppd of cigarettes.  We discussed the implications and counseled regarding cessation.  She denied significant viral illnesses during the course of her pregnancy.   I counseled [name] regarding the above risks and available options.  The approximate face-to-face time with the genetic counselor was 35 minutes.  Despina Arias, MS Certified Genetic Counselor

## 2013-03-09 ENCOUNTER — Telehealth: Payer: Self-pay | Admitting: Advanced Practice Midwife

## 2013-03-09 ENCOUNTER — Telehealth: Payer: Self-pay | Admitting: Cardiovascular Disease

## 2013-03-09 MED ORDER — PROPYLTHIOURACIL 50 MG PO TABS: 100.0000 mg | ORAL_TABLET | Freq: Three times a day (TID) | ORAL | Status: DC

## 2013-03-09 NOTE — Telephone Encounter (Signed)
Pt hyperthyroid based on labs drawn in MAU this week and is symptomatic with weight loss in pregnancy.  PTU 100 mg TID called to pt pharmacy, [facility] on [address]  Left message for pt.

## 2013-03-09 NOTE — Telephone Encounter (Signed)
SPOKE WITH PT NOT SURE WHAT  SHE NEEDS  PT  DID NOT STAY  THE LAST  OFFICE  VISIT  WITH [facility]  LEFT BEFORE  VISIT WAS FINISHED HAD TO GO TO WORK STATED  SHE WAS LEFT IN ROOM FOR  45 MIN    ALSO REQUEST  RECORDS TO BE SENT TO [facility]  INFORMED PT  THAT NOTHING NEEDS TO BE SENT WOMEN'S HAS  ACCESS  TO ALL OF  INFO VIA   EPIC  PT  DID  NOT  UNDERSTAND  REQUESTS   THAT  SUPERVISOR WOULD CALL .BECAME VERY  IRRITATED  BEFORE PHONE CALL WAS FINISHED./CY

## 2013-03-09 NOTE — Telephone Encounter (Signed)
New Problem   [facility]

## 2013-03-10 ENCOUNTER — Inpatient Hospital Stay (HOSPITAL_COMMUNITY)
Admission: AD | Admit: 2013-03-10 | Discharge: 2013-03-10 | Disposition: A | Discharge status: Home / Self Care | Payer: Medicare Other | Source: Ambulatory Visit | Attending: Obstetrics & Gynecology | Admitting: Obstetrics & Gynecology

## 2013-03-10 ENCOUNTER — Encounter (HOSPITAL_COMMUNITY): Payer: Self-pay | Admitting: *Deleted

## 2013-03-10 DIAGNOSIS — O360990 Maternal care for other rhesus isoimmunization, unspecified trimester, not applicable or unspecified: Secondary | ICD-10-CM

## 2013-03-10 DIAGNOSIS — E079 Disorder of thyroid, unspecified: Secondary | ICD-10-CM | POA: Diagnosis not present

## 2013-03-10 DIAGNOSIS — O99891 Other specified diseases and conditions complicating pregnancy: Secondary | ICD-10-CM | POA: Insufficient documentation

## 2013-03-10 DIAGNOSIS — L0232 Furuncle of buttock: Secondary | ICD-10-CM

## 2013-03-10 DIAGNOSIS — E0590 Thyrotoxicosis, unspecified without thyrotoxic crisis or storm: Secondary | ICD-10-CM | POA: Diagnosis present

## 2013-03-10 DIAGNOSIS — L0233 Carbuncle of buttock: Secondary | ICD-10-CM | POA: Insufficient documentation

## 2013-03-10 DIAGNOSIS — O09529 Supervision of elderly multigravida, unspecified trimester: Secondary | ICD-10-CM

## 2013-03-10 DIAGNOSIS — O360190 Maternal care for anti-D [Rh] antibodies, unspecified trimester, not applicable or unspecified: Secondary | ICD-10-CM

## 2013-03-10 DIAGNOSIS — O0990 Supervision of high risk pregnancy, unspecified, unspecified trimester: Secondary | ICD-10-CM

## 2013-03-10 HISTORY — DX: Thyrotoxicosis, unspecified without thyrotoxic crisis or storm: E05.90

## 2013-03-10 MED ORDER — CLINDAMYCIN HCL 300 MG PO CAPS: 300.0000 mg | ORAL_CAPSULE | Freq: Three times a day (TID) | ORAL | Status: DC

## 2013-03-10 NOTE — MAU Provider Note (Signed)
History     CSN: [id number]  Arrival date and time: 03/10/13 2203   First Provider Initiated Contact with Patient 03/10/13 2239      Chief Complaint  Patient presents with  . Recurrent Skin Infections   HPI  Pt is G4P1021 at [redacted]w[redacted]d weeks IUP here with report of boil on right buttock.  Pt noticed yesterday.  Site is painful to the touch.  Reports a similar type lesion close to existing one was seen in the previous week that ruptured on it's own.  Pt also concerned due to decreased ability to sleep, requests Ambien to help sleep.  Pt recently diagnosed with hyperthyroidism.  Did not pick up RX.    Past Medical History  Diagnosis Date  . Sinus tachycardia   . HTN (hypertension)   . Arm pain   . Eye globe prosthesis   . CHF (congestive heart failure)   . Bipolar affective disorder, currently manic, mild   . Hyperthyroidism     Past Surgical History  Procedure Laterality Date  . Eye surgery    . Dilation and curettage of uterus      Family History  Problem Relation Age of Onset  . Hypertension Other   . Emphysema Other   . Asthma Son   . Diabetes Maternal Uncle   . Diabetes Paternal Grandmother     History  Substance Use Topics  . Smoking status: Current Every Day Smoker -- 0.25 packs/day    Types: Cigarettes  . Smokeless tobacco: Never Used  . Alcohol Use: No     Comment: daily, currently 01/01/13    Allergies:  Allergies  Allergen Reactions  . Amoxil [Amoxicillin] Anaphylaxis  . Trazodone And Nefazodone Anaphylaxis  . Eggs Or Egg-Derived Products Hives and Swelling  . Ketorolac Tromethamine Hives and Swelling  . Tegretol [Carbamazepine]     Swelling skin peeling    Prescriptions prior to admission  Medication Sig Dispense Refill  . metroNIDAZOLE (FLAGYL) 500 MG tablet Take 1 tablet (500 mg total) by mouth 2 (two) times daily.  14 tablet  0  . Prenatal Vit-Fe Fumarate-FA (PRENATAL VITAMINS) 28-0.8 MG TABS Take 1 tablet by mouth daily.  30 tablet  5  .  promethazine (PHENERGAN) 25 MG tablet Take 1 tablet (25 mg total) by mouth every 6 (six) hours as needed for nausea.  30 tablet  0  . valACYclovir (VALTREX) 500 MG tablet Take 500 mg by mouth daily.      . [DISCONTINUED] nitrofurantoin, macrocrystal-monohydrate, (MACROBID) 100 MG capsule Take 1 capsule (100 mg total) by mouth 2 (two) times daily.  14 capsule  0  . propylthiouracil (PTU) 50 MG tablet Take 2 tablets (100 mg total) by mouth 3 (three) times daily.  180 tablet  2  . zolpidem (AMBIEN) 10 MG tablet Take 10 mg by mouth at bedtime as needed for sleep.        Review of Systems  Skin:       Lesion on right buttock  Psychiatric/Behavioral: The patient has insomnia.   All other systems reviewed and are negative.   Physical Exam   Blood pressure 96/60, pulse 111, temperature 98.1 F (36.7 C), temperature source Oral, resp. rate 18, last menstrual period 11/28/2012.  Physical Exam  Constitutional: She is oriented to person, place, and time. She appears well-developed and well-nourished.  HENT:  Head: Normocephalic.  Neck: Normal range of motion. Neck supple.  Neurological: She is alert and oriented to person, place, and time.  Skin:  Skin is warm and dry.       MAU Course  Procedures  Pt requests discharge information as soon as examination was completed.    Assessment and Plan  Boil Hyperthyroidism  Plan: Explained to pt would not I&D at time; would apply warm compresses to area and begin antibiotics RX Clindamycin sent to pharmacy. Explained importance of beginning medication for hyperthyroidism.  Pt states will pick up medication with antibiotic tomorrow.  While preparing pt discharge information, pt became upset that paperwork wasn't ready to leave.  Asked RN to inform pt that there were others needing discharge prior to her who had waited longer.  Pt leaves without receiving discharge paperwork.    [facility] 03/10/2013, 11:13 PM

## 2013-03-10 NOTE — MAU Note (Signed)
When RN came out of another room, Pt had left MAU without discharge paperwork.

## 2013-03-10 NOTE — MAU Note (Addendum)
Pt reports a painful boil on R buttocks. Requesting pain meds.

## 2013-03-11 NOTE — MAU Provider Note (Signed)
Attestation of Attending Supervision of Advanced Practitioner (PA/CNM/NP): Evaluation and management procedures were performed by the Advanced Practitioner under my supervision and collaboration.  I have reviewed the Advanced Practitioner's note and chart, and I agree with the management and plan.  Frandy Basnett, MD, FACOG Attending Obstetrician & Gynecologist Faculty Practice, [facility]  

## 2013-03-12 NOTE — Telephone Encounter (Signed)
Called pt and confirmed that she ahd received the message from [facility] regarding need for medication. Pt stated that she did receive the message and it was not in stock at her pharmacy- had to be ordered. She will be picking up her medication from the pharmacy today. Pt had no questions.

## 2013-03-13 ENCOUNTER — Encounter: Payer: Self-pay | Admitting: *Deleted

## 2013-03-14 ENCOUNTER — Telehealth (HOSPITAL_COMMUNITY): Payer: Self-pay

## 2013-03-14 NOTE — Telephone Encounter (Signed)
DISCUSSED WITH GINA LURZ  NURSE MANAGER WILL ADDRESS./CY

## 2013-03-14 NOTE — Telephone Encounter (Signed)
Called Christine Cobb to discuss her cell free fetal DNA test results.  Mrs. Christine Cobb had Panorama testing through [facility].  Testing was offered because of advanced maternal age.   The patient was identified by name and DOB.  We reviewed that these are within normal limits, showing a less than 1 in 10,000 risk for trisomies 21, 18 and 13, and monosomy X (Turner syndrome).  In addition, the risk for triploidy/vanishing twin and sex chromosome trisomies (47,XXX and 47,XXY) was also low risk.   We reviewed that this testing identifies > 99% of pregnancies with trisomy 19, trisomy 28, trisomy 34, sex chromosome trisomies (47,XXX and 47,XXY), and triploidy.  The detection rate for monosomy X is ~92%.  The false positive rate is <0.1% for all conditions. Testing was also consistent with female gender.  The patient did not wish to know gender.  She understands that this testing does not identify all genetic conditions.  All questions were answered to her satisfaction, she was encouraged to call with additional questions or concerns.  Christine Gemma, MS Certified Genetic Counselor

## 2013-03-18 ENCOUNTER — Telehealth: Payer: Self-pay | Admitting: Advanced Practice Midwife

## 2013-03-18 NOTE — Telephone Encounter (Signed)
Pt called MAU to discuss recurrance of boils that she was having. However, she was concerned that about the mediciation (PTU) that had been prescribed for her. She states that the pamplet given by the pharmacist states that the medication can "kill her or her unborn baby". I informed her that this medication is safe to take, and has been used for pregnant women. She became very angry and began to yell at me over the phone. I advised her that I could not help her over the phone, and she should call the clinic on Monday to schedule an appointment. She asked for the name of my supervisor, and I gave her Dr. Bertram Denver name.

## 2013-03-20 NOTE — MAU Provider Note (Signed)
Attestation of Attending Supervision of Advanced Practitioner (CNM/NP): Evaluation and management procedures were performed by the Advanced Practitioner under my supervision and collaboration.  I have reviewed the Advanced Practitioner's note and chart, and I agree with the management and plan.  HARRAWAY-SMITH, Trula Frede 11:53 AM

## 2013-03-22 ENCOUNTER — Ambulatory Visit (INDEPENDENT_AMBULATORY_CARE_PROVIDER_SITE_OTHER): Payer: Medicare Other | Admitting: Family Medicine

## 2013-03-22 VITALS — BP 134/84 | Temp 96.8°F | Wt 134.0 lb

## 2013-03-22 DIAGNOSIS — O360190 Maternal care for anti-D [Rh] antibodies, unspecified trimester, not applicable or unspecified: Secondary | ICD-10-CM

## 2013-03-22 DIAGNOSIS — E079 Disorder of thyroid, unspecified: Secondary | ICD-10-CM | POA: Diagnosis not present

## 2013-03-22 DIAGNOSIS — O360990 Maternal care for other rhesus isoimmunization, unspecified trimester, not applicable or unspecified: Secondary | ICD-10-CM | POA: Diagnosis not present

## 2013-03-22 DIAGNOSIS — I10 Essential (primary) hypertension: Secondary | ICD-10-CM | POA: Diagnosis not present

## 2013-03-22 DIAGNOSIS — E0590 Thyrotoxicosis, unspecified without thyrotoxic crisis or storm: Secondary | ICD-10-CM

## 2013-03-22 DIAGNOSIS — G4700 Insomnia, unspecified: Secondary | ICD-10-CM | POA: Diagnosis not present

## 2013-03-22 DIAGNOSIS — O0991 Supervision of high risk pregnancy, unspecified, first trimester: Secondary | ICD-10-CM

## 2013-03-22 LAB — POCT URINALYSIS DIP (DEVICE)
Glucose, UA: NEGATIVE mg/dL
Nitrite: NEGATIVE
Protein, ur: NEGATIVE mg/dL
Urobilinogen, UA: 0.2 mg/dL (ref 0.0–1.0)
pH: 6 (ref 5.0–8.0)

## 2013-03-22 MED ORDER — ZOLPIDEM TARTRATE 5 MG PO TABS: 5.0000 mg | ORAL_TABLET | Freq: Every evening | ORAL | Status: DC | PRN

## 2013-03-22 NOTE — Progress Notes (Signed)
Pulse  102 C/o left abdomen pain last night that lasted for 3 hours.

## 2013-03-22 NOTE — Progress Notes (Signed)
S: pt is a 37 yo G4P1021 @ [redacted]w[redacted]d here for ROBV - no ctx, vb, LOF - BP at goal for her- no ha, vision changes, LEE.  - hyperthyroid- started PTU on Monday  O" see flowsheet  A/P  - refill of ambien - hyperthyroidism - needs repeat labs in 4 weeks. Cont PTU at current dose.  - HTN- at goal. Stressed to pt the importance of needing to collect 24 hour urine. Has the container at home. Will bring to next visit completed  - f/u in 4 weeks - will need blood work at that visit - has gained weight from last visit (up 7 lbs)

## 2013-03-22 NOTE — Patient Instructions (Signed)
Pregnancy - Second Trimester The second trimester of pregnancy (3 to 6 months) is a period of rapid growth for you and your baby. At the end of the sixth month, your baby is about 9 inches long and weighs 1 1/2 pounds. You will begin to feel the baby move between 18 and 20 weeks of the pregnancy. This is called quickening. Weight gain is faster. A clear fluid (colostrum) may leak out of your breasts. You may feel small contractions of the womb (uterus). This is known as false labor or Braxton-Hicks contractions. This is like a practice for labor when the baby is ready to be born. Usually, the problems with morning sickness have usually passed by the end of your first trimester. Some women develop small dark blotches (called cholasma, mask of pregnancy) on their face that usually goes away after the baby is born. Exposure to the sun makes the blotches worse. Acne may also develop in some pregnant women and pregnant women who have acne, may find that it goes away. PRENATAL EXAMS  Blood work may continue to be done during prenatal exams. These tests are done to check on your health and the probable health of your baby. Blood work is used to follow your blood levels (hemoglobin). Anemia (low hemoglobin) is common during pregnancy. Iron and vitamins are given to help prevent this. You will also be checked for diabetes between 24 and 28 weeks of the pregnancy. Some of the previous blood tests may be repeated.  The size of the uterus is measured during each visit. This is to make sure that the baby is continuing to grow properly according to the dates of the pregnancy.  Your blood pressure is checked every prenatal visit. This is to make sure you are not getting toxemia.  Your urine is checked to make sure you do not have an infection, diabetes or protein in the urine.  Your weight is checked often to make sure gains are happening at the suggested rate. This is to ensure that both you and your baby are  growing normally.  Sometimes, an ultrasound is performed to confirm the proper growth and development of the baby. This is a test which bounces harmless sound waves off the baby so your caregiver can more accurately determine due dates. Sometimes, a test is done on the amniotic fluid surrounding the baby. This test is called an amniocentesis. The amniotic fluid is obtained by sticking a needle into the belly (abdomen). This is done to check the chromosomes in instances where there is a concern about possible genetic problems with the baby. It is also sometimes done near the end of pregnancy if an early delivery is required. In this case, it is done to help make sure the baby's lungs are mature enough for the baby to live outside of the womb. CHANGES OCCURING IN THE SECOND TRIMESTER OF PREGNANCY Your body goes through many changes during pregnancy. They vary from person to person. Talk to your caregiver about changes you notice that you are concerned about.  During the second trimester, you will likely have an increase in your appetite. It is normal to have cravings for certain foods. This varies from person to person and pregnancy to pregnancy.  Your lower abdomen will begin to bulge.  You may have to urinate more often because the uterus and baby are pressing on your bladder. It is also common to get more bladder infections during pregnancy. You can help this by drinking lots of fluids   and emptying your bladder before and after intercourse.  You may begin to get stretch marks on your hips, abdomen, and breasts. These are normal changes in the body during pregnancy. There are no exercises or medicines to take that prevent this change.  You may begin to develop swollen and bulging veins (varicose veins) in your legs. Wearing support hose, elevating your feet for 15 minutes, 3 to 4 times a day and limiting salt in your diet helps lessen the problem.  Heartburn may develop as the uterus grows and  pushes up against the stomach. Antacids recommended by your caregiver helps with this problem. Also, eating smaller meals 4 to 5 times a day helps.  Constipation can be treated with a stool softener or adding bulk to your diet. Drinking lots of fluids, and eating vegetables, fruits, and whole grains are helpful.  Exercising is also helpful. If you have been very active up until your pregnancy, most of these activities can be continued during your pregnancy. If you have been less active, it is helpful to start an exercise program such as walking.  Hemorrhoids may develop at the end of the second trimester. Warm sitz baths and hemorrhoid cream recommended by your caregiver helps hemorrhoid problems.  Backaches may develop during this time of your pregnancy. Avoid heavy lifting, wear low heal shoes, and practice good posture to help with backache problems.  Some pregnant women develop tingling and numbness of their hand and fingers because of swelling and tightening of ligaments in the wrist (carpel tunnel syndrome). This goes away after the baby is born.  As your breasts enlarge, you may have to get a bigger bra. Get a comfortable, cotton, support bra. Do not get a nursing bra until the last month of the pregnancy if you will be nursing the baby.  You may get a dark line from your belly button to the pubic area called the linea nigra.  You may develop rosy cheeks because of increase blood flow to the face.  You may develop spider looking lines of the face, neck, arms, and chest. These go away after the baby is born. HOME CARE INSTRUCTIONS   It is extremely important to avoid all smoking, herbs, alcohol, and unprescribed drugs during your pregnancy. These chemicals affect the formation and growth of the baby. Avoid these chemicals throughout the pregnancy to ensure the delivery of a healthy infant.  Most of your home care instructions are the same as suggested for the first trimester of your  pregnancy. Keep your caregiver's appointments. Follow your caregiver's instructions regarding medicine use, exercise, and diet.  During pregnancy, you are providing food for you and your baby. Continue to eat regular, well-balanced meals. Choose foods such as meat, fish, milk and other low fat dairy products, vegetables, fruits, and whole-grain breads and cereals. Your caregiver will tell you of the ideal weight gain.  A physical sexual relationship may be continued up until near the end of pregnancy if there are no other problems. Problems could include early (premature) leaking of amniotic fluid from the membranes, vaginal bleeding, abdominal pain, or other medical or pregnancy problems.  Exercise regularly if there are no restrictions. Check with your caregiver if you are unsure of the safety of some of your exercises. The greatest weight gain will occur in the last 2 trimesters of pregnancy. Exercise will help you:  Control your weight.  Get you in shape for labor and delivery.  Lose weight after you have the baby.  Wear   a good support or jogging bra for breast tenderness during pregnancy. This may help if worn during sleep. Pads or tissues may be used in the bra if you are leaking colostrum.  Do not use hot tubs, steam rooms or saunas throughout the pregnancy.  Wear your seat belt at all times when driving. This protects you and your baby if you are in an accident.  Avoid raw meat, uncooked cheese, cat litter boxes, and soil used by cats. These carry germs that can cause birth defects in the baby.  The second trimester is also a good time to visit your dentist for your dental health if this has not been done yet. Getting your teeth cleaned is okay. Use a soft toothbrush. Brush gently during pregnancy.  It is easier to leak urine during pregnancy. Tightening up and strengthening the pelvic muscles will help with this problem. Practice stopping your urination while you are going to the  bathroom. These are the same muscles you need to strengthen. It is also the muscles you would use as if you were trying to stop from passing gas. You can practice tightening these muscles up 10 times a set and repeating this about 3 times per day. Once you know what muscles to tighten up, do not perform these exercises during urination. It is more likely to contribute to an infection by backing up the urine.  Ask for help if you have financial, counseling, or nutritional needs during pregnancy. Your caregiver will be able to offer counseling for these needs as well as refer you for other special needs.  Your skin may become oily. If so, wash your face with mild soap, use non-greasy moisturizer and oil or cream based makeup. MEDICINES AND DRUG USE IN PREGNANCY  Take prenatal vitamins as directed. The vitamin should contain 1 milligram of folic acid. Keep all vitamins out of reach of children. Only a couple vitamins or tablets containing iron may be fatal to a baby or young child when ingested.  Avoid use of all medicines, including herbs, over-the-counter medicines, not prescribed or suggested by your caregiver. Only take over-the-counter or prescription medicines for pain, discomfort, or fever as directed by your caregiver. Do not use aspirin.  Let your caregiver also know about herbs you may be using.  Alcohol is related to a number of birth defects. This includes fetal alcohol syndrome. All alcohol, in any form, should be avoided completely. Smoking will cause low birth rate and premature babies.  Street or illegal drugs are very harmful to the baby. They are absolutely forbidden. A baby born to an addicted mother will be addicted at birth. The baby will go through the same withdrawal an adult does. SEEK MEDICAL CARE IF:  You have any concerns or worries during your pregnancy. It is better to call with your questions if you feel they cannot wait, rather than worry about them. SEEK IMMEDIATE  MEDICAL CARE IF:   An unexplained oral temperature above 102 F (38.9 C) develops, or as your caregiver suggests.  You have leaking of fluid from the vagina (birth canal). If leaking membranes are suspected, take your temperature and tell your caregiver of this when you call.  There is vaginal spotting, bleeding, or passing clots. Tell your caregiver of the amount and how many pads are used. Light spotting in pregnancy is common, especially following intercourse.  You develop a bad smelling vaginal discharge with a change in the color from clear to white.  You continue to feel   sick to your stomach (nauseated) and have no relief from remedies suggested. You vomit blood or coffee ground-like materials.  You lose more than 2 pounds of weight or gain more than 2 pounds of weight over 1 week, or as suggested by your caregiver.  You notice swelling of your face, hands, feet, or legs.  You get exposed to German measles and have never had them.  You are exposed to fifth disease or chickenpox.  You develop belly (abdominal) pain. Round ligament discomfort is a common non-cancerous (benign) cause of abdominal pain in pregnancy. Your caregiver still must evaluate you.  You develop a bad headache that does not go away.  You develop fever, diarrhea, pain with urination, or shortness of breath.  You develop visual problems, blurry, or double vision.  You fall or are in a car accident or any kind of trauma.  There is mental or physical violence at home. Document Released: 06/08/2001 Document Revised: 03/08/2012 Document Reviewed: 12/11/2008 Exit[employer] Patient Information 2014 [employer].  

## 2013-03-30 ENCOUNTER — Inpatient Hospital Stay (HOSPITAL_COMMUNITY)
Admission: AD | Admit: 2013-03-30 | Discharge: 2013-03-31 | Disposition: A | Discharge status: Home / Self Care | Payer: Medicare Other | Source: Ambulatory Visit | Attending: Obstetrics & Gynecology | Admitting: Obstetrics & Gynecology

## 2013-03-30 DIAGNOSIS — O99891 Other specified diseases and conditions complicating pregnancy: Secondary | ICD-10-CM | POA: Insufficient documentation

## 2013-03-30 DIAGNOSIS — E079 Disorder of thyroid, unspecified: Secondary | ICD-10-CM

## 2013-03-30 DIAGNOSIS — O09529 Supervision of elderly multigravida, unspecified trimester: Secondary | ICD-10-CM

## 2013-03-30 DIAGNOSIS — E0590 Thyrotoxicosis, unspecified without thyrotoxic crisis or storm: Secondary | ICD-10-CM

## 2013-03-30 DIAGNOSIS — N949 Unspecified condition associated with female genital organs and menstrual cycle: Secondary | ICD-10-CM | POA: Diagnosis not present

## 2013-03-30 DIAGNOSIS — R109 Unspecified abdominal pain: Secondary | ICD-10-CM | POA: Diagnosis not present

## 2013-03-30 DIAGNOSIS — O0991 Supervision of high risk pregnancy, unspecified, first trimester: Secondary | ICD-10-CM

## 2013-03-30 DIAGNOSIS — O360190 Maternal care for anti-D [Rh] antibodies, unspecified trimester, not applicable or unspecified: Secondary | ICD-10-CM

## 2013-03-31 ENCOUNTER — Encounter (HOSPITAL_COMMUNITY): Payer: Self-pay | Admitting: *Deleted

## 2013-03-31 LAB — URINALYSIS, ROUTINE W REFLEX MICROSCOPIC
Bilirubin Urine: NEGATIVE
Ketones, ur: NEGATIVE mg/dL
Protein, ur: NEGATIVE mg/dL
Urobilinogen, UA: 0.2 mg/dL (ref 0.0–1.0)

## 2013-03-31 LAB — URINE MICROSCOPIC-ADD ON

## 2013-03-31 LAB — WET PREP, GENITAL
Clue Cells Wet Prep HPF POC: NONE SEEN
Trich, Wet Prep: NONE SEEN

## 2013-03-31 LAB — RAPID URINE DRUG SCREEN, HOSP PERFORMED
Amphetamines: NOT DETECTED
Barbiturates: NOT DETECTED
Tetrahydrocannabinol: POSITIVE — AB

## 2013-03-31 MED ORDER — ACETAMINOPHEN 500 MG PO TABS
1000.0000 mg | ORAL_TABLET | Freq: Once | ORAL | Status: AC
  Administered 2013-03-31: 1000 mg via ORAL
  Filled 2013-03-31: qty 2

## 2013-03-31 NOTE — MAU Provider Note (Signed)
Attestation of Attending Supervision of Advanced Practitioner (PA/CNM/NP): Evaluation and management procedures were performed by the Advanced Practitioner under my supervision and collaboration.  I have reviewed the Advanced Practitioner's note and chart, and I agree with the management and plan.  Adalynd Donahoe, MD, FACOG Attending Obstetrician & Gynecologist Faculty Practice, [facility]  

## 2013-03-31 NOTE — MAU Note (Signed)
Pt reports she started having leaking of fluid. Started having cramping 3 hrs ago.

## 2013-03-31 NOTE — MAU Provider Note (Signed)
History     CSN: [id number]  Arrival date and time: 03/30/13 2345   First Provider Initiated Contact with Patient 03/31/13 0012      Chief Complaint  Patient presents with  . Vaginal Discharge   HPI  [name] is a 37 y.o. female [phone] at [redacted]w[redacted]d who presents to MAU with various complaints. She is experiencing cramping throughout her body, including her back and her feet. She has been working long hours today and feels her feet are swollen. She was concerned because at work her work pants were wet,  she does not wear underwear and thought she saw some amniotic fluid on her pants. She thinks maybe she did to much and is concerned about working during pregnancy because its hard on her body. Last bowel movement was 2 hours ago. She reports good fetal movement, vaginal bleeding, vaginal itching/burning, urinary symptoms, h/a, dizziness, n/v, or fever/chills.   Pt smokes marijuana; last use was 3 hours ago. Pt presents with a plate of food including waffles, grits, hash browns, cheese cake and ham; she is upset that she cannot eat right now while I am talking to her.   OB History   Grav Para Term Preterm Abortions TAB SAB Ect Mult Living   4 1 1  2 1 1   1       Past Medical History  Diagnosis Date  . Sinus tachycardia   . HTN (hypertension)   . Arm pain   . Eye globe prosthesis   . CHF (congestive heart failure)   . Bipolar affective disorder, currently manic, mild   . Hyperthyroidism     Past Surgical History  Procedure Laterality Date  . Eye surgery    . Dilation and curettage of uterus      Family History  Problem Relation Age of Onset  . Hypertension Other   . Emphysema Other   . Asthma Son   . Diabetes Maternal Uncle   . Diabetes Paternal Grandmother     History  Substance Use Topics  . Smoking status: Current Every Day Smoker -- 0.25 packs/day    Types: Cigarettes  . Smokeless tobacco: Never Used  . Alcohol Use: No     Comment: daily, currently  01/01/13    Allergies:  Allergies  Allergen Reactions  . Amoxil [Amoxicillin] Anaphylaxis  . Trazodone And Nefazodone Anaphylaxis  . Eggs Or Egg-Derived Products Hives and Swelling  . Ketorolac Tromethamine Hives and Swelling  . Tegretol [Carbamazepine]     Swelling skin peeling    Prescriptions prior to admission  Medication Sig Dispense Refill  . clindamycin (CLEOCIN) 300 MG capsule Take 1 capsule (300 mg total) by mouth 3 (three) times daily.  21 capsule  0  . HYDROcodone-acetaminophen (NORCO) 10-325 MG per tablet Take 1 tablet by mouth every 6 (six) hours as needed for pain.      . Prenatal Vit-Fe Fumarate-FA (PRENATAL VITAMINS) 28-0.8 MG TABS Take 1 tablet by mouth daily.  30 tablet  5  . promethazine (PHENERGAN) 25 MG tablet Take 1 tablet (25 mg total) by mouth every 6 (six) hours as needed for nausea.  30 tablet  0  . propylthiouracil (PTU) 50 MG tablet Take 2 tablets (100 mg total) by mouth 3 (three) times daily.  180 tablet  2  . valACYclovir (VALTREX) 500 MG tablet Take 500 mg by mouth daily.      Marland Kitchen zolpidem (AMBIEN) 5 MG tablet Take 1 tablet (5 mg total) by mouth at  bedtime as needed for sleep.  30 tablet  0   Results for orders placed during the hospital encounter of 03/30/13 (from the past 24 hour(s))  URINALYSIS, ROUTINE W REFLEX MICROSCOPIC     Status: Abnormal   Collection Time    03/30/13 11:50 PM      Result Value Range   Color, Urine YELLOW  YELLOW   APPearance CLEAR  CLEAR   Specific Gravity, Urine <1.005 (*) 1.005 - 1.030   pH 6.0  5.0 - 8.0   Glucose, UA NEGATIVE  NEGATIVE mg/dL   Hgb urine dipstick TRACE (*) NEGATIVE   Bilirubin Urine NEGATIVE  NEGATIVE   Ketones, ur NEGATIVE  NEGATIVE mg/dL   Protein, ur NEGATIVE  NEGATIVE mg/dL   Urobilinogen, UA 0.2  0.0 - 1.0 mg/dL   Nitrite NEGATIVE  NEGATIVE   Leukocytes, UA NEGATIVE  NEGATIVE  URINE MICROSCOPIC-ADD ON     Status: None   Collection Time    03/30/13 11:50 PM      Result Value Range   Squamous  Epithelial / LPF RARE  RARE   WBC, UA 0-2  <3 WBC/hpf   Bacteria, UA RARE  RARE  WET PREP, GENITAL     Status: Abnormal   Collection Time    03/31/13 12:25 AM      Result Value Range   Yeast Wet Prep HPF POC NONE SEEN  NONE SEEN   Trich, Wet Prep NONE SEEN  NONE SEEN   Clue Cells Wet Prep HPF POC NONE SEEN  NONE SEEN   WBC, Wet Prep HPF POC FEW (*) NONE SEEN    Review of Systems  Constitutional: Negative for fever and chills.  Cardiovascular: Positive for leg swelling.  Gastrointestinal: Positive for abdominal pain and constipation. Negative for nausea, vomiting and diarrhea.       + abdominal cramps   Genitourinary: Negative for dysuria, urgency and frequency.  Musculoskeletal: Positive for back pain.  Neurological: Negative for dizziness and headaches.   Physical Exam   Filed Vitals:   03/31/13 0044  BP: 101/57  Pulse: 112  Temp: 98.5 F (36.9 C)  Resp: 18   Fetal heart tones 152 bpm by doppler   Last menstrual period 11/28/2012.  Physical Exam  Constitutional: She is oriented to person, place, and time. She appears well-developed and well-nourished. No distress.  Eyes: Pupils are equal, round, and reactive to light.  Neck: Neck supple.  Respiratory: Effort normal.  GI: Soft. She exhibits no distension. There is no tenderness. There is no rebound and no guarding.  Genitourinary: No vaginal discharge found.  Speculum exam: Vagina - Small amount of creamy discharge, no odor No pooling of fluid in vaginal canal  Cervix - No contact bleeding Bimanual exam: Cervix closed Uterus non tender, normal size for gestational age Adnexa non tender, no masses bilaterally GC/Chlam, wet prep done Chaperone present for exam.   Neurological: She is alert and oriented to person, place, and time.  Skin: Skin is warm and dry. She is not diaphoretic.    MAU Course  Procedures None  MDM +fht UA Wet prep GC/Chlamydia Tylenol 1 gram  Assessment and Plan  A: Round  ligament pain  P: Discharge home Return to MAU if symptoms worsen Pregnancy support belt discussed Ok to take tylenol as directed on the bottle Keep your appointment with the clinic as scheduled.    RASCH, JENNIFER IRENE FNP-C 03/31/2013, 12:49 AM

## 2013-04-03 ENCOUNTER — Telehealth: Payer: Self-pay | Admitting: *Deleted

## 2013-04-03 NOTE — Telephone Encounter (Signed)
Patient called with concerns about mucus that she was seeing. I called her back and she stated that she was having clear mucus discharge. I advised her that this sounded normal and that if it had odor itching or was bothersome to her that she should call us to be checked out. Pt was understanding and appreciative of advice.

## 2013-04-10 ENCOUNTER — Encounter (HOSPITAL_COMMUNITY): Payer: Self-pay | Admitting: Emergency Medicine

## 2013-04-10 ENCOUNTER — Emergency Department (HOSPITAL_COMMUNITY)
Admission: EM | Admit: 2013-04-10 | Discharge: 2013-04-11 | Disposition: A | Discharge status: Home / Self Care | Payer: Medicare Other | Attending: Emergency Medicine | Admitting: Emergency Medicine

## 2013-04-10 ENCOUNTER — Other Ambulatory Visit: Payer: Self-pay | Admitting: Family Medicine

## 2013-04-10 DIAGNOSIS — Z8739 Personal history of other diseases of the musculoskeletal system and connective tissue: Secondary | ICD-10-CM | POA: Diagnosis not present

## 2013-04-10 DIAGNOSIS — I509 Heart failure, unspecified: Secondary | ICD-10-CM | POA: Diagnosis not present

## 2013-04-10 DIAGNOSIS — O10019 Pre-existing essential hypertension complicating pregnancy, unspecified trimester: Secondary | ICD-10-CM

## 2013-04-10 DIAGNOSIS — O169 Unspecified maternal hypertension, unspecified trimester: Secondary | ICD-10-CM | POA: Insufficient documentation

## 2013-04-10 DIAGNOSIS — Z79899 Other long term (current) drug therapy: Secondary | ICD-10-CM | POA: Diagnosis not present

## 2013-04-10 DIAGNOSIS — O99330 Smoking (tobacco) complicating pregnancy, unspecified trimester: Secondary | ICD-10-CM | POA: Diagnosis not present

## 2013-04-10 DIAGNOSIS — S301XXA Contusion of abdominal wall, initial encounter: Secondary | ICD-10-CM | POA: Diagnosis not present

## 2013-04-10 DIAGNOSIS — T7411XA Adult physical abuse, confirmed, initial encounter: Secondary | ICD-10-CM | POA: Diagnosis not present

## 2013-04-10 DIAGNOSIS — O209 Hemorrhage in early pregnancy, unspecified: Secondary | ICD-10-CM | POA: Insufficient documentation

## 2013-04-10 DIAGNOSIS — O9989 Other specified diseases and conditions complicating pregnancy, childbirth and the puerperium: Secondary | ICD-10-CM | POA: Insufficient documentation

## 2013-04-10 DIAGNOSIS — O09529 Supervision of elderly multigravida, unspecified trimester: Secondary | ICD-10-CM

## 2013-04-10 DIAGNOSIS — E0590 Thyrotoxicosis, unspecified without thyrotoxic crisis or storm: Secondary | ICD-10-CM | POA: Diagnosis not present

## 2013-04-10 DIAGNOSIS — O2690 Pregnancy related conditions, unspecified, unspecified trimester: Secondary | ICD-10-CM

## 2013-04-10 DIAGNOSIS — Z8659 Personal history of other mental and behavioral disorders: Secondary | ICD-10-CM | POA: Insufficient documentation

## 2013-04-10 DIAGNOSIS — O9A212 Injury, poisoning and certain other consequences of external causes complicating pregnancy, second trimester: Secondary | ICD-10-CM

## 2013-04-10 DIAGNOSIS — Z970 Presence of artificial eye: Secondary | ICD-10-CM | POA: Diagnosis not present

## 2013-04-10 MED ORDER — HYDROCODONE-ACETAMINOPHEN 5-325 MG PO TABS
1.0000 | ORAL_TABLET | Freq: Once | ORAL | Status: AC
  Administered 2013-04-10: 1 via ORAL
  Filled 2013-04-10: qty 1

## 2013-04-10 NOTE — ED Notes (Signed)
Gave patient turkey sandwich and sprite. 
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
## 2013-04-11 ENCOUNTER — Ambulatory Visit (HOSPITAL_COMMUNITY)
Admission: RE | Admit: 2013-04-11 | Discharge: 2013-04-11 | Disposition: A | Discharge status: Home / Self Care | Payer: Medicare Other | Source: Ambulatory Visit | Attending: Obstetrics & Gynecology | Admitting: Obstetrics & Gynecology

## 2013-04-11 ENCOUNTER — Other Ambulatory Visit (HOSPITAL_COMMUNITY): Payer: Medicare Other

## 2013-04-11 DIAGNOSIS — O10019 Pre-existing essential hypertension complicating pregnancy, unspecified trimester: Secondary | ICD-10-CM

## 2013-04-11 DIAGNOSIS — O2690 Pregnancy related conditions, unspecified, unspecified trimester: Secondary | ICD-10-CM

## 2013-04-11 DIAGNOSIS — O09529 Supervision of elderly multigravida, unspecified trimester: Secondary | ICD-10-CM

## 2013-04-12 ENCOUNTER — Inpatient Hospital Stay (HOSPITAL_COMMUNITY)
Admission: AD | Admit: 2013-04-12 | Discharge: 2013-04-12 | Disposition: A | Discharge status: Home / Self Care | Payer: Medicare Other | Source: Ambulatory Visit | Attending: Obstetrics & Gynecology | Admitting: Obstetrics & Gynecology

## 2013-04-12 ENCOUNTER — Encounter (HOSPITAL_COMMUNITY): Payer: Self-pay | Admitting: *Deleted

## 2013-04-12 DIAGNOSIS — R109 Unspecified abdominal pain: Secondary | ICD-10-CM | POA: Insufficient documentation

## 2013-04-12 DIAGNOSIS — O99891 Other specified diseases and conditions complicating pregnancy: Secondary | ICD-10-CM | POA: Diagnosis not present

## 2013-04-12 DIAGNOSIS — O26899 Other specified pregnancy related conditions, unspecified trimester: Secondary | ICD-10-CM

## 2013-04-12 DIAGNOSIS — Z5900 Homelessness unspecified: Secondary | ICD-10-CM | POA: Diagnosis not present

## 2013-04-12 DIAGNOSIS — E0590 Thyrotoxicosis, unspecified without thyrotoxic crisis or storm: Secondary | ICD-10-CM

## 2013-04-12 DIAGNOSIS — E079 Disorder of thyroid, unspecified: Secondary | ICD-10-CM

## 2013-04-12 DIAGNOSIS — O360190 Maternal care for anti-D [Rh] antibodies, unspecified trimester, not applicable or unspecified: Secondary | ICD-10-CM

## 2013-04-12 DIAGNOSIS — O0991 Supervision of high risk pregnancy, unspecified, first trimester: Secondary | ICD-10-CM

## 2013-04-12 DIAGNOSIS — F1210 Cannabis abuse, uncomplicated: Secondary | ICD-10-CM

## 2013-04-12 DIAGNOSIS — O09529 Supervision of elderly multigravida, unspecified trimester: Secondary | ICD-10-CM

## 2013-04-12 LAB — WET PREP, GENITAL: Trich, Wet Prep: NONE SEEN

## 2013-04-12 LAB — URINALYSIS, ROUTINE W REFLEX MICROSCOPIC
Glucose, UA: NEGATIVE mg/dL
Leukocytes, UA: NEGATIVE
Nitrite: NEGATIVE
Protein, ur: NEGATIVE mg/dL
pH: 6 (ref 5.0–8.0)

## 2013-04-12 LAB — RAPID URINE DRUG SCREEN, HOSP PERFORMED
Barbiturates: NOT DETECTED
Cocaine: NOT DETECTED
Opiates: NOT DETECTED
Tetrahydrocannabinol: NOT DETECTED

## 2013-04-12 LAB — CBC
HCT: 30.8 % — ABNORMAL LOW (ref 36.0–46.0)
Hemoglobin: 10.6 g/dL — ABNORMAL LOW (ref 12.0–15.0)
MCH: 28.3 pg (ref 26.0–34.0)
MCHC: 34.4 g/dL (ref 30.0–36.0)
Platelets: 258 10*3/uL (ref 150–400)
RBC: 3.74 MIL/uL — ABNORMAL LOW (ref 3.87–5.11)

## 2013-04-12 LAB — OB RESULTS CONSOLE GC/CHLAMYDIA
CHLAMYDIA, DNA PROBE: NEGATIVE
GC PROBE AMP, GENITAL: NEGATIVE

## 2013-04-12 NOTE — MAU Provider Note (Signed)
Attestation of Attending Supervision of Advanced Practitioner (PA/CNM/NP): Evaluation and management procedures were performed by the Advanced Practitioner under my supervision and collaboration.  I have reviewed the Advanced Practitioner's note and chart, and I agree with the management and plan.  Deyani Hegarty, MD, FACOG Attending Obstetrician & Gynecologist Faculty Practice, [facility]   
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
## 2013-04-12 NOTE — MAU Note (Signed)
I GAVE HER A BUS PASS AND  A LIST    OF [place]S .
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
## 2013-04-12 NOTE — MAU Note (Signed)
CALLED SOCIAL WORKER- ABIGAIL ELMORE-   [phone]-     SHE CALLED ME BACK-  WITH  INFO-       GIVE PT BUS PASS AND SHE CAN GO TO  [facility] IF SHE CALLS [facility]IS [facility]-  [phone] AND CONVINCES THEM THAT SHE NEEDS A PLACE TO STAY TONIGHT .     PT SAID SHE DID NOT WANT TO STAY IN [facility]-  SHE WOULD JUST GO STAY WITH HER FRIEND.
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
## 2013-04-13 LAB — GC/CHLAMYDIA PROBE AMP
CT Probe RNA: NEGATIVE
GC Probe RNA: NEGATIVE
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
## 2013-04-19 ENCOUNTER — Encounter: Payer: Medicare Other | Admitting: Family

## 2013-04-19 ENCOUNTER — Ambulatory Visit (INDEPENDENT_AMBULATORY_CARE_PROVIDER_SITE_OTHER): Payer: Medicare Other | Admitting: Obstetrics & Gynecology

## 2013-04-19 VITALS — BP 125/78 | Temp 97.1°F | Wt 140.6 lb

## 2013-04-19 DIAGNOSIS — R946 Abnormal results of thyroid function studies: Secondary | ICD-10-CM | POA: Diagnosis not present

## 2013-04-19 DIAGNOSIS — O09523 Supervision of elderly multigravida, third trimester: Secondary | ICD-10-CM

## 2013-04-19 DIAGNOSIS — R7989 Other specified abnormal findings of blood chemistry: Secondary | ICD-10-CM

## 2013-04-19 DIAGNOSIS — O09529 Supervision of elderly multigravida, unspecified trimester: Secondary | ICD-10-CM | POA: Diagnosis not present

## 2013-04-19 DIAGNOSIS — E079 Disorder of thyroid, unspecified: Secondary | ICD-10-CM | POA: Diagnosis not present

## 2013-04-19 DIAGNOSIS — O10019 Pre-existing essential hypertension complicating pregnancy, unspecified trimester: Secondary | ICD-10-CM | POA: Diagnosis not present

## 2013-04-19 DIAGNOSIS — O0990 Supervision of high risk pregnancy, unspecified, unspecified trimester: Secondary | ICD-10-CM | POA: Diagnosis not present

## 2013-04-19 DIAGNOSIS — I10 Essential (primary) hypertension: Secondary | ICD-10-CM

## 2013-04-19 DIAGNOSIS — O10912 Unspecified pre-existing hypertension complicating pregnancy, second trimester: Secondary | ICD-10-CM

## 2013-04-19 DIAGNOSIS — O10919 Unspecified pre-existing hypertension complicating pregnancy, unspecified trimester: Secondary | ICD-10-CM | POA: Insufficient documentation

## 2013-04-19 DIAGNOSIS — E0590 Thyrotoxicosis, unspecified without thyrotoxic crisis or storm: Secondary | ICD-10-CM

## 2013-04-19 LAB — POCT URINALYSIS DIP (DEVICE)
Glucose, UA: NEGATIVE mg/dL
Hgb urine dipstick: NEGATIVE
Leukocytes, UA: NEGATIVE
Nitrite: NEGATIVE
Urobilinogen, UA: 0.2 mg/dL (ref 0.0–1.0)
pH: 6.5 (ref 5.0–8.0)

## 2013-04-19 LAB — CBC
Hemoglobin: 10.5 g/dL — ABNORMAL LOW (ref 12.0–15.0)
MCH: 28.2 pg (ref 26.0–34.0)
MCHC: 33.2 g/dL (ref 30.0–36.0)
Platelets: 255 10*3/uL (ref 150–400)
RDW: 14.1 % (ref 11.5–15.5)

## 2013-04-19 NOTE — Progress Notes (Signed)
On exam,no hemorrhoids seen. Patient denies any rectal bleeding.  Will check thyroid labs today, MSAFP screen, also check baseline CHTN labs.  Anatomy scan scheduled on 05/27/13.   Declines flu vaccine.  No other complaints or concerns.  Routine obstetric precautions reviewed.
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
## 2013-04-19 NOTE — Patient Instructions (Signed)
Return to clinic for any obstetric concerns or go to MAU for evaluation  

## 2013-04-20 LAB — COMPREHENSIVE METABOLIC PANEL
ALT: 20 U/L (ref 0–35)
AST: 17 U/L (ref 0–37)
Albumin: 3.6 g/dL (ref 3.5–5.2)
Alkaline Phosphatase: 55 U/L (ref 39–117)
Glucose, Bld: 72 mg/dL (ref 70–99)
Potassium: 3.4 mEq/L — ABNORMAL LOW (ref 3.5–5.3)
Sodium: 134 mEq/L — ABNORMAL LOW (ref 135–145)
Total Bilirubin: 0.5 mg/dL (ref 0.3–1.2)
Total Protein: 6.1 g/dL (ref 6.0–8.3)

## 2013-04-20 LAB — TSH: TSH: <0.008 u[IU]/mL — ABNORMAL LOW (ref 0.350–4.500)

## 2013-04-20 LAB — PROTEIN / CREATININE RATIO, URINE
Creatinine, Urine: 58 mg/dL
Total Protein, Urine: <3 mg/dL

## 2013-04-23 LAB — AFP, QUAD SCREEN
AFP: 128 IU/mL
Age Alone: 1:297 {titer}
Down Syndrome Scr Risk Est: 1:704 {titer}
HCG, Total: 43696 m[IU]/mL
INH: 334.9 pg/mL
MoM for AFP: 2.43
MoM for hCG: 2.53
Open Spina bifida: NEGATIVE
Osb Risk: 1:558 {titer}
Tri 18 Scr Risk Est: NEGATIVE
uE3 Value: 1.4 ng/mL

## 2013-04-26 ENCOUNTER — Encounter: Payer: Self-pay | Admitting: Obstetrics & Gynecology

## 2013-04-26 ENCOUNTER — Ambulatory Visit (HOSPITAL_COMMUNITY): Admission: RE | Admit: 2013-04-26 | Payer: Medicare Other | Source: Ambulatory Visit

## 2013-05-03 ENCOUNTER — Emergency Department (HOSPITAL_COMMUNITY)
Admission: AD | Admit: 2013-05-03 | Discharge: 2013-05-03 | Discharge status: Against Medical Advice | Payer: Medicare Other | Source: Ambulatory Visit | Attending: Emergency Medicine | Admitting: Emergency Medicine

## 2013-05-03 ENCOUNTER — Telehealth: Payer: Self-pay | Admitting: General Practice

## 2013-05-03 ENCOUNTER — Ambulatory Visit (INDEPENDENT_AMBULATORY_CARE_PROVIDER_SITE_OTHER): Payer: Medicare Other | Admitting: Family

## 2013-05-03 ENCOUNTER — Encounter (HOSPITAL_COMMUNITY): Payer: Self-pay | Admitting: *Deleted

## 2013-05-03 VITALS — BP 128/78 | Temp 97.8°F | Wt 141.6 lb

## 2013-05-03 DIAGNOSIS — E079 Disorder of thyroid, unspecified: Secondary | ICD-10-CM | POA: Diagnosis not present

## 2013-05-03 DIAGNOSIS — I509 Heart failure, unspecified: Secondary | ICD-10-CM | POA: Diagnosis not present

## 2013-05-03 DIAGNOSIS — O99891 Other specified diseases and conditions complicating pregnancy: Secondary | ICD-10-CM | POA: Diagnosis not present

## 2013-05-03 DIAGNOSIS — O99340 Other mental disorders complicating pregnancy, unspecified trimester: Secondary | ICD-10-CM | POA: Diagnosis not present

## 2013-05-03 DIAGNOSIS — O10019 Pre-existing essential hypertension complicating pregnancy, unspecified trimester: Secondary | ICD-10-CM

## 2013-05-03 DIAGNOSIS — E0590 Thyrotoxicosis, unspecified without thyrotoxic crisis or storm: Secondary | ICD-10-CM | POA: Diagnosis not present

## 2013-05-03 DIAGNOSIS — I498 Other specified cardiac arrhythmias: Secondary | ICD-10-CM | POA: Diagnosis not present

## 2013-05-03 DIAGNOSIS — K625 Hemorrhage of anus and rectum: Secondary | ICD-10-CM | POA: Insufficient documentation

## 2013-05-03 DIAGNOSIS — F3110 Bipolar disorder, current episode manic without psychotic features, unspecified: Secondary | ICD-10-CM | POA: Diagnosis not present

## 2013-05-03 DIAGNOSIS — O99330 Smoking (tobacco) complicating pregnancy, unspecified trimester: Secondary | ICD-10-CM | POA: Insufficient documentation

## 2013-05-03 DIAGNOSIS — O10912 Unspecified pre-existing hypertension complicating pregnancy, second trimester: Secondary | ICD-10-CM

## 2013-05-03 DIAGNOSIS — G4700 Insomnia, unspecified: Secondary | ICD-10-CM

## 2013-05-03 DIAGNOSIS — O0991 Supervision of high risk pregnancy, unspecified, first trimester: Secondary | ICD-10-CM

## 2013-05-03 DIAGNOSIS — K921 Melena: Secondary | ICD-10-CM | POA: Insufficient documentation

## 2013-05-03 LAB — URINALYSIS, ROUTINE W REFLEX MICROSCOPIC
Bilirubin Urine: NEGATIVE
Glucose, UA: NEGATIVE mg/dL
Hgb urine dipstick: NEGATIVE
Ketones, ur: NEGATIVE mg/dL
Leukocytes, UA: NEGATIVE
Protein, ur: NEGATIVE mg/dL
pH: 6 (ref 5.0–8.0)

## 2013-05-03 LAB — POCT URINALYSIS DIP (DEVICE)
Bilirubin Urine: NEGATIVE
Leukocytes, UA: NEGATIVE
Nitrite: NEGATIVE
Protein, ur: NEGATIVE mg/dL
pH: 6 (ref 5.0–8.0)

## 2013-05-03 LAB — CBC
MCHC: 33.7 g/dL (ref 30.0–36.0)
RDW: 14.8 % (ref 11.5–15.5)

## 2013-05-03 LAB — COMPREHENSIVE METABOLIC PANEL
ALT: 8 U/L (ref 0–35)
AST: 14 U/L (ref 0–37)
Albumin: 2.9 g/dL — ABNORMAL LOW (ref 3.5–5.2)
Alkaline Phosphatase: 64 U/L (ref 39–117)
CO2: 23 mEq/L (ref 19–32)
GFR calc non Af Amer: >90 mL/min (ref >90)
Potassium: 3.1 mEq/L — ABNORMAL LOW (ref 3.5–5.1)
Sodium: 135 mEq/L (ref 135–145)
Total Bilirubin: 0.2 mg/dL — ABNORMAL LOW (ref 0.3–1.2)
Total Protein: 6.2 g/dL (ref 6.0–8.3)

## 2013-05-03 LAB — RAPID URINE DRUG SCREEN, HOSP PERFORMED
Amphetamines: NOT DETECTED
Benzodiazepines: NOT DETECTED
Tetrahydrocannabinol: NOT DETECTED

## 2013-05-03 MED ORDER — HYDROCORTISONE ACE-PRAMOXINE 1-1 % RE FOAM: 1.0000 | Freq: Two times a day (BID) | RECTAL | Status: DC

## 2013-05-03 MED ORDER — LACTATED RINGERS IV SOLN: INTRAVENOUS | Status: DC

## 2013-05-03 MED ORDER — ZOLPIDEM TARTRATE 5 MG PO TABS: 5.0000 mg | ORAL_TABLET | Freq: Every evening | ORAL | Status: DC | PRN

## 2013-05-03 MED ORDER — ACETAMINOPHEN 500 MG PO TABS
1000.0000 mg | ORAL_TABLET | Freq: Once | ORAL | Status: AC
  Administered 2013-05-03: 1000 mg via ORAL
  Filled 2013-05-03: qty 2

## 2013-05-03 MED ORDER — ONDANSETRON 8 MG PO TBDP: 8.0000 mg | ORAL_TABLET | Freq: Once | ORAL | Status: DC

## 2013-05-03 MED ORDER — LACTATED RINGERS IV BOLUS (SEPSIS)
1000.0000 mL | Freq: Once | INTRAVENOUS | Status: AC
  Administered 2013-05-03: 1000 mL via INTRAVENOUS

## 2013-05-03 NOTE — Telephone Encounter (Signed)
Patient called in to front office reporting blood in stool and abdominal pain. Patient denies diarrhea, constipation, or vomiting. Spoke to Dr Debroah Loop and he suggested to have the patient come in to be seen by a provider. Informed patient that we could see her at 1:30 today but if she cannot come in to report to MAU for evaluation. Patient stated she would try to come in today for the 1:30 appt. Patient had no further questions
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
## 2013-05-03 NOTE — Patient Instructions (Signed)
Please call the [facility] for further questions regarding possible admissions process.  [phone].

## 2013-05-03 NOTE — ED Notes (Signed)
Patient placed in Triage #2 by [facility]link.  Patient belongings were left in room by care link and patient changed out of her gown and removed her own IV.  She exited the building without notification of staff.

## 2013-05-03 NOTE — ED Notes (Signed)
PT arrived to ER via [facility] and placed in triage room 2; pt was provided bag of belongings by [facility]; [facility] gave report to accepting RN Topher Darcel Bayley, RN; pt was seen sitting in triage 2 chair in a hospital gown  2010 PT was not found in room or able to be located in ER or surrounding area; Security was notified and went to search outside and main hospital for pt; Security unable to locate pt; in triage room 2 there was an empty bag where pt's belongings had been and pt had pulled out IV; 22g IV cath was intact to IV fluids left hanging in room  Minerva Areola, [facility] at [facility] notified that pt had left the building; Victorino Dike, RN [facility] notified
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
## 2013-05-03 NOTE — MAU Note (Signed)
Patient presents to MAu with c/o blood in stool and hearing voices. Patient denies wanting to harm herself or others. States "my mind is not right". Patient was sent up from the clinic for further evaluation. Patient states has headache, reports dizziness and light headed.
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
## 2013-05-04 NOTE — MAU Provider Note (Signed)
Attestation of Attending Supervision of Advanced Practitioner (CNM/NP): Evaluation and management procedures were performed by the Advanced Practitioner under my supervision and collaboration.  I have reviewed the Advanced Practitioner's note and chart, and I agree with the management and plan.  Laken Lobato 05/04/2013 5:32 AM

## 2013-05-06 ENCOUNTER — Inpatient Hospital Stay (HOSPITAL_COMMUNITY)
Admission: AD | Admit: 2013-05-06 | Discharge: 2013-05-07 | Disposition: A | Discharge status: Home / Self Care | Payer: Medicare Other | Source: Ambulatory Visit | Attending: Obstetrics & Gynecology | Admitting: Obstetrics & Gynecology

## 2013-05-06 DIAGNOSIS — H44002 Unspecified purulent endophthalmitis, left eye: Secondary | ICD-10-CM

## 2013-05-06 DIAGNOSIS — R491 Aphonia: Secondary | ICD-10-CM | POA: Insufficient documentation

## 2013-05-06 DIAGNOSIS — J069 Acute upper respiratory infection, unspecified: Secondary | ICD-10-CM | POA: Insufficient documentation

## 2013-05-06 DIAGNOSIS — R059 Cough, unspecified: Secondary | ICD-10-CM | POA: Insufficient documentation

## 2013-05-06 DIAGNOSIS — J029 Acute pharyngitis, unspecified: Secondary | ICD-10-CM | POA: Insufficient documentation

## 2013-05-06 DIAGNOSIS — R0982 Postnasal drip: Secondary | ICD-10-CM | POA: Insufficient documentation

## 2013-05-06 DIAGNOSIS — O99891 Other specified diseases and conditions complicating pregnancy: Secondary | ICD-10-CM | POA: Insufficient documentation

## 2013-05-06 DIAGNOSIS — R05 Cough: Secondary | ICD-10-CM | POA: Insufficient documentation

## 2013-05-07 ENCOUNTER — Encounter (HOSPITAL_COMMUNITY): Payer: Self-pay | Admitting: *Deleted

## 2013-05-07 DIAGNOSIS — O99891 Other specified diseases and conditions complicating pregnancy: Secondary | ICD-10-CM | POA: Diagnosis not present

## 2013-05-07 DIAGNOSIS — R0982 Postnasal drip: Secondary | ICD-10-CM | POA: Diagnosis not present

## 2013-05-07 DIAGNOSIS — R491 Aphonia: Secondary | ICD-10-CM | POA: Diagnosis not present

## 2013-05-07 DIAGNOSIS — J069 Acute upper respiratory infection, unspecified: Secondary | ICD-10-CM

## 2013-05-07 DIAGNOSIS — J029 Acute pharyngitis, unspecified: Secondary | ICD-10-CM | POA: Diagnosis not present

## 2013-05-07 DIAGNOSIS — R059 Cough, unspecified: Secondary | ICD-10-CM | POA: Diagnosis not present

## 2013-05-07 MED ORDER — ACETAMINOPHEN 325 MG PO TABS
650.0000 mg | ORAL_TABLET | Freq: Once | ORAL | Status: AC
  Administered 2013-05-07: 650 mg via ORAL
  Filled 2013-05-07: qty 2

## 2013-05-07 MED ORDER — CIPROFLOXACIN HCL 0.3 % OP SOLN: 2.0000 [drp] | OPHTHALMIC | Status: DC

## 2013-05-07 NOTE — MAU Note (Signed)
Sore throat since Friday. 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

## 2013-05-07 NOTE — MAU Provider Note (Signed)
History     CSN: [id number]  Arrival date and time: 05/06/13 2348   First Provider Initiated Contact with Patient 05/07/13 0043      No chief complaint on file.  HPI  Christine Cobb is a 37 y.o. [id number] at [redacted]w[redacted]d presents for complaints of runny nose, cough, sore throat, loss of voice and post nasal drip.   Pt is also complaining of discharge from her left ocular prothesis. Pt has been getting her care from provider in [place] and is unable to reach tonight.  Pt denies headache, fever, chills, sob, n/v, d/c, weakness.  OB History   Grav Para Term Preterm Abortions TAB SAB Ect Mult Living   4 1 1  2 1 1   1       Past Medical History  Diagnosis Date  . Sinus tachycardia   . HTN (hypertension)   . Arm pain   . Eye globe prosthesis   . CHF (congestive heart failure)   . Bipolar affective disorder, currently manic, mild   . Hyperthyroidism     Past Surgical History  Procedure Laterality Date  . Eye surgery    . Dilation and curettage of uterus      Family History  Problem Relation Age of Onset  . Hypertension Other   . Emphysema Other   . Asthma Son   . Diabetes Maternal Uncle   . Diabetes Paternal Grandmother     History  Substance Use Topics  . Smoking status: Current Every Day Smoker -- 0.25 packs/day    Types: Cigarettes  . Smokeless tobacco: Never Used  . Alcohol Use: No     Comment: daily, currently 01/01/13-   LAST TIME HAD BEER-  WAS IN MAY 2014-  WAS IN AA MEETINGS.    Allergies:  Allergies  Allergen Reactions  . Amoxil [Amoxicillin] Anaphylaxis  . Trazodone And Nefazodone Anaphylaxis  . Eggs Or Egg-Derived Products Hives and Swelling  . Ketorolac Tromethamine Hives and Swelling  . Prenatal [B Complex Vitamins Plus] Nausea Only  . Tegretol [Carbamazepine] Other (See Comments)    Swelling, skin peeling, skin discoloration    Prescriptions prior to admission  Medication Sig Dispense Refill  . propylthiouracil (PTU) 50 MG tablet Take 100 mg  by mouth daily.      Marland Kitchen zolpidem (AMBIEN) 5 MG tablet Take 1 tablet (5 mg total) by mouth at bedtime as needed for sleep.  30 tablet  0    ROS  See above  Physical Exam   Blood pressure 112/66, pulse 98, temperature 97.6 F (36.4 C), temperature source Oral, resp. rate 18, last menstrual period 11/28/2012.  Physical Exam Pt with perulent discharge on left eye lid. Pt removed eye cover and prothesis has ocular muscles over. Appears irritated and inflamed. Palpable mandibular lymphadenopaty, no tonsillar exudate, no sinus tenderness.  VSS NAD RRR no mgt CTAB no wrc  FHT: 150s   MAU Course  Procedures   Assessment and Plan  Christine Cobb is a 37 y.o. [id number] at [redacted]w[redacted]d with ocular prosthesis discharge and upper respiratory infection.  #Upper respiratory infection: recommend hydration, good hand hygene, symptomatic treatment with chloroseptic spray, salt water gurgles, and expect slow improvement of cough.  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   #Psych hx: pt recently seen here and transferred to [facility]. Pt left AMA. Pt reports she was waiting for a  prolonged period of time. Pt states she is NO longer hearing voices. Pt denies HI/SI.  Tawana Scale 05/07/2013, 12:59 AM

## 2013-05-08 NOTE — MAU Provider Note (Signed)
Attestation of Attending Supervision of Fellow: Evaluation and management procedures were performed by the Fellow under my supervision and collaboration.  I have reviewed the Fellow's note and chart, and I agree with the management and plan.    

## 2013-05-09 ENCOUNTER — Ambulatory Visit (HOSPITAL_COMMUNITY)
Admission: RE | Admit: 2013-05-09 | Discharge: 2013-05-09 | Disposition: A | Discharge status: Home / Self Care | Payer: Medicare Other | Source: Ambulatory Visit | Attending: Family Medicine | Admitting: Family Medicine

## 2013-05-09 DIAGNOSIS — Z1389 Encounter for screening for other disorder: Secondary | ICD-10-CM | POA: Diagnosis not present

## 2013-05-09 DIAGNOSIS — O09529 Supervision of elderly multigravida, unspecified trimester: Secondary | ICD-10-CM | POA: Diagnosis not present

## 2013-05-09 DIAGNOSIS — O10019 Pre-existing essential hypertension complicating pregnancy, unspecified trimester: Secondary | ICD-10-CM | POA: Insufficient documentation

## 2013-05-09 DIAGNOSIS — Z363 Encounter for antenatal screening for malformations: Secondary | ICD-10-CM | POA: Insufficient documentation

## 2013-05-09 DIAGNOSIS — O0990 Supervision of high risk pregnancy, unspecified, unspecified trimester: Secondary | ICD-10-CM | POA: Insufficient documentation

## 2013-05-09 DIAGNOSIS — O99340 Other mental disorders complicating pregnancy, unspecified trimester: Secondary | ICD-10-CM | POA: Diagnosis not present

## 2013-05-09 DIAGNOSIS — E079 Disorder of thyroid, unspecified: Secondary | ICD-10-CM | POA: Diagnosis not present

## 2013-05-09 DIAGNOSIS — O99330 Smoking (tobacco) complicating pregnancy, unspecified trimester: Secondary | ICD-10-CM | POA: Diagnosis not present

## 2013-05-09 DIAGNOSIS — O358XX0 Maternal care for other (suspected) fetal abnormality and damage, not applicable or unspecified: Secondary | ICD-10-CM | POA: Insufficient documentation

## 2013-05-11 ENCOUNTER — Encounter: Payer: Self-pay | Admitting: Obstetrics & Gynecology

## 2013-05-12 ENCOUNTER — Encounter (HOSPITAL_COMMUNITY): Payer: Self-pay | Admitting: *Deleted

## 2013-05-12 ENCOUNTER — Inpatient Hospital Stay (HOSPITAL_COMMUNITY)
Admission: AD | Admit: 2013-05-12 | Discharge: 2013-05-12 | Disposition: A | Discharge status: Home / Self Care | Payer: Medicare Other | Source: Ambulatory Visit | Attending: Obstetrics & Gynecology | Admitting: Obstetrics & Gynecology

## 2013-05-12 DIAGNOSIS — O10019 Pre-existing essential hypertension complicating pregnancy, unspecified trimester: Secondary | ICD-10-CM | POA: Insufficient documentation

## 2013-05-12 DIAGNOSIS — E0590 Thyrotoxicosis, unspecified without thyrotoxic crisis or storm: Secondary | ICD-10-CM | POA: Insufficient documentation

## 2013-05-12 DIAGNOSIS — E079 Disorder of thyroid, unspecified: Secondary | ICD-10-CM | POA: Diagnosis not present

## 2013-05-12 DIAGNOSIS — O99330 Smoking (tobacco) complicating pregnancy, unspecified trimester: Secondary | ICD-10-CM | POA: Insufficient documentation

## 2013-05-12 DIAGNOSIS — O368190 Decreased fetal movements, unspecified trimester, not applicable or unspecified: Secondary | ICD-10-CM | POA: Insufficient documentation

## 2013-05-12 LAB — URINALYSIS, ROUTINE W REFLEX MICROSCOPIC
Bilirubin Urine: NEGATIVE
Glucose, UA: NEGATIVE mg/dL
Ketones, ur: NEGATIVE mg/dL
Leukocytes, UA: NEGATIVE
Protein, ur: NEGATIVE mg/dL
pH: 6 (ref 5.0–8.0)

## 2013-05-12 MED ORDER — MENTHOL 3 MG MT LOZG
1.0000 | LOZENGE | OROMUCOSAL | Status: DC | PRN
  Filled 2013-05-12: qty 9

## 2013-05-12 MED ORDER — ONDANSETRON 4 MG PO TBDP
4.0000 mg | ORAL_TABLET | Freq: Once | ORAL | Status: AC
  Administered 2013-05-12: 4 mg via ORAL
  Filled 2013-05-12: qty 1

## 2013-05-12 MED ORDER — ACETAMINOPHEN 325 MG PO TABS: 650.0000 mg | ORAL_TABLET | Freq: Once | ORAL | Status: DC

## 2013-05-12 MED ORDER — ACETAMINOPHEN 500 MG PO TABS
1000.0000 mg | ORAL_TABLET | Freq: Once | ORAL | Status: AC
  Administered 2013-05-12: 1000 mg via ORAL
  Filled 2013-05-12: qty 2

## 2013-05-12 NOTE — MAU Provider Note (Signed)
History     CSN: [id number]  Arrival date and time: 05/12/13 0101   First Provider Initiated Contact with Patient 05/12/13 0145      Chief Complaint  Patient presents with  . Decreased Fetal Movement   HPI Christine Cobb is a 37yo G4P1021 at 22.4wks who presents to MAU for eval of decreased fetal movement. Reports intermittent upper abd discomfort, denies bldg or leak. She receives her prenatal care at the [facility] and it has been remarkable for 1) CHTN- no mes 2) hyperthyroidism 3) extensive mental health concerns including bipolar.  OB History   Grav Para Term Preterm Abortions TAB SAB Ect Mult Living   4 1 1  2 1 1   1       Past Medical History  Diagnosis Date  . Sinus tachycardia   . HTN (hypertension)   . Arm pain   . Eye globe prosthesis   . CHF (congestive heart failure)   . Bipolar affective disorder, currently manic, mild   . Hyperthyroidism     Past Surgical History  Procedure Laterality Date  . Eye surgery    . Dilation and curettage of uterus      Family History  Problem Relation Age of Onset  . Hypertension Other   . Emphysema Other   . Asthma Son   . Diabetes Maternal Uncle   . Diabetes Paternal Grandmother     History  Substance Use Topics  . Smoking status: Current Every Day Smoker -- 0.25 packs/day    Types: Cigarettes  . Smokeless tobacco: Never Used  . Alcohol Use: No     Comment: daily, currently 01/01/13-   LAST TIME HAD BEER-  WAS IN MAY 2014-  WAS IN AA MEETINGS.    Allergies:  Allergies  Allergen Reactions  . Amoxil [Amoxicillin] Anaphylaxis  . Trazodone And Nefazodone Anaphylaxis  . Eggs Or Egg-Derived Products Hives and Swelling  . Ketorolac Tromethamine Hives and Swelling  . Prenatal [B Complex Vitamins Plus] Nausea Only  . Tegretol [Carbamazepine] Other (See Comments)    Swelling, skin peeling, skin discoloration    Prescriptions prior to admission  Medication Sig Dispense Refill  . ciprofloxacin (CILOXAN) 0.3 % ophthalmic  solution Place 2 drops into the left eye every 2 (two) hours while awake. Administer 1 drop, every 2 hours, while awake, for 2 days. Then 1 drop, every 4 hours, while awake, for the next 5 days.  5 mL  0  . propylthiouracil (PTU) 50 MG tablet Take 100 mg by mouth daily.      . [DISCONTINUED] zolpidem (AMBIEN) 5 MG tablet Take 1 tablet (5 mg total) by mouth at bedtime as needed for sleep.  30 tablet  0    ROS Physical Exam   Blood pressure 130/84, pulse 114, temperature 97.9 F (36.6 C), temperature source Oral, resp. rate 20, weight 67.677 kg (149 lb 3.2 oz), last menstrual period 11/28/2012, SpO2 99.00%.  Physical Exam  Constitutional: She is oriented to person, place, and time. She appears well-developed.  Cardiovascular:  Intermittent tachycardia  Respiratory: Effort normal.  GI:  FHR dopplered at 153  Genitourinary:  Exam deferred  Musculoskeletal: Normal range of motion.  Neurological: She is alert and oriented to person, place, and time.  Skin: Skin is warm and dry.  Psychiatric: She has a normal mood and affect. Her behavior is normal. Thought content normal.   Urinalysis    Component Value Date/Time   COLORURINE YELLOW 05/12/2013 0120   APPEARANCEUR CLEAR 05/12/2013  0120   LABSPEC 1.010 05/12/2013 0120   PHURINE 6.0 05/12/2013 0120   GLUCOSEU NEGATIVE 05/12/2013 0120   HGBUR NEGATIVE 05/12/2013 0120   BILIRUBINUR NEGATIVE 05/12/2013 0120   KETONESUR NEGATIVE 05/12/2013 0120   PROTEINUR NEGATIVE 05/12/2013 0120   UROBILINOGEN 0.2 05/12/2013 0120   NITRITE NEGATIVE 05/12/2013 0120   LEUKOCYTESUR NEGATIVE 05/12/2013 0120      MAU Course  Procedures  Given Tylenol 1g for generalized discomfort- relieved; given Zofran ODT for nausea that developed once here  Assessment and Plan  IUP at 22.4wks Decreased fetal movement  D/C home with information on when to expect regular FM F/U as scheduled at [facility] 05/12/2013, 3:25 AM
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
## 2013-05-12 NOTE — MAU Note (Signed)
Pt states feels baby moving now.

## 2013-05-12 NOTE — Progress Notes (Signed)
Written and verbal d/c instructions given and understanding voiced. Ride here for pt

## 2013-05-16 ENCOUNTER — Emergency Department (HOSPITAL_COMMUNITY): Payer: Medicare Other

## 2013-05-16 ENCOUNTER — Encounter (HOSPITAL_COMMUNITY): Payer: Self-pay | Admitting: Emergency Medicine

## 2013-05-16 ENCOUNTER — Inpatient Hospital Stay (HOSPITAL_COMMUNITY)
Admission: EM | Admit: 2013-05-16 | Discharge: 2013-05-17 | DRG: 781 | Disposition: A | Discharge status: Other Acute Inpatient Hospital | Payer: Medicare Other | Attending: Emergency Medicine | Admitting: Emergency Medicine

## 2013-05-16 DIAGNOSIS — E872 Acidosis, unspecified: Secondary | ICD-10-CM

## 2013-05-16 DIAGNOSIS — I509 Heart failure, unspecified: Secondary | ICD-10-CM | POA: Diagnosis present

## 2013-05-16 DIAGNOSIS — O211 Hyperemesis gravidarum with metabolic disturbance: Secondary | ICD-10-CM | POA: Diagnosis not present

## 2013-05-16 DIAGNOSIS — O99891 Other specified diseases and conditions complicating pregnancy: Secondary | ICD-10-CM | POA: Diagnosis not present

## 2013-05-16 DIAGNOSIS — I2510 Atherosclerotic heart disease of native coronary artery without angina pectoris: Secondary | ICD-10-CM | POA: Diagnosis not present

## 2013-05-16 DIAGNOSIS — R7989 Other specified abnormal findings of blood chemistry: Secondary | ICD-10-CM

## 2013-05-16 DIAGNOSIS — F259 Schizoaffective disorder, unspecified: Secondary | ICD-10-CM | POA: Diagnosis present

## 2013-05-16 DIAGNOSIS — O10019 Pre-existing essential hypertension complicating pregnancy, unspecified trimester: Secondary | ICD-10-CM | POA: Diagnosis present

## 2013-05-16 DIAGNOSIS — E860 Dehydration: Secondary | ICD-10-CM | POA: Diagnosis not present

## 2013-05-16 DIAGNOSIS — I959 Hypotension, unspecified: Secondary | ICD-10-CM | POA: Diagnosis not present

## 2013-05-16 DIAGNOSIS — Z833 Family history of diabetes mellitus: Secondary | ICD-10-CM | POA: Diagnosis not present

## 2013-05-16 DIAGNOSIS — E0590 Thyrotoxicosis, unspecified without thyrotoxic crisis or storm: Secondary | ICD-10-CM

## 2013-05-16 DIAGNOSIS — N189 Chronic kidney disease, unspecified: Secondary | ICD-10-CM

## 2013-05-16 DIAGNOSIS — F17200 Nicotine dependence, unspecified, uncomplicated: Secondary | ICD-10-CM | POA: Diagnosis present

## 2013-05-16 DIAGNOSIS — F29 Unspecified psychosis not due to a substance or known physiological condition: Secondary | ICD-10-CM | POA: Diagnosis not present

## 2013-05-16 DIAGNOSIS — E079 Disorder of thyroid, unspecified: Secondary | ICD-10-CM | POA: Diagnosis not present

## 2013-05-16 DIAGNOSIS — O9989 Other specified diseases and conditions complicating pregnancy, childbirth and the puerperium: Secondary | ICD-10-CM | POA: Diagnosis not present

## 2013-05-16 DIAGNOSIS — I498 Other specified cardiac arrhythmias: Secondary | ICD-10-CM | POA: Diagnosis present

## 2013-05-16 DIAGNOSIS — Z3492 Encounter for supervision of normal pregnancy, unspecified, second trimester: Secondary | ICD-10-CM

## 2013-05-16 DIAGNOSIS — K7689 Other specified diseases of liver: Secondary | ICD-10-CM | POA: Diagnosis not present

## 2013-05-16 DIAGNOSIS — F23 Brief psychotic disorder: Secondary | ICD-10-CM

## 2013-05-16 DIAGNOSIS — F3111 Bipolar disorder, current episode manic without psychotic features, mild: Secondary | ICD-10-CM | POA: Diagnosis present

## 2013-05-16 DIAGNOSIS — R109 Unspecified abdominal pain: Secondary | ICD-10-CM | POA: Diagnosis not present

## 2013-05-16 LAB — COMPREHENSIVE METABOLIC PANEL
ALT: 242 U/L — ABNORMAL HIGH (ref 0–35)
AST: 129 U/L — ABNORMAL HIGH (ref 0–37)
Albumin: 3.1 g/dL — ABNORMAL LOW (ref 3.5–5.2)
Alkaline Phosphatase: 123 U/L — ABNORMAL HIGH (ref 39–117)
CO2: 11 mEq/L — ABNORMAL LOW (ref 19–32)
Chloride: 96 mEq/L (ref 96–112)
Creatinine, Ser: 0.88 mg/dL (ref 0.50–1.10)
GFR calc non Af Amer: 84 mL/min — ABNORMAL LOW (ref >90)
Potassium: 3.6 mEq/L (ref 3.5–5.1)
Sodium: 132 mEq/L — ABNORMAL LOW (ref 135–145)
Total Bilirubin: 0.5 mg/dL (ref 0.3–1.2)
Total Protein: 6.7 g/dL (ref 6.0–8.3)

## 2013-05-16 LAB — CBC
MCV: 84.9 fL (ref 78.0–100.0)
Platelets: 333 10*3/uL (ref 150–400)
RBC: 3.85 MIL/uL — ABNORMAL LOW (ref 3.87–5.11)
RDW: 14 % (ref 11.5–15.5)
WBC: 16.7 10*3/uL — ABNORMAL HIGH (ref 4.0–10.5)

## 2013-05-16 LAB — RAPID URINE DRUG SCREEN, HOSP PERFORMED
Amphetamines: NOT DETECTED
Benzodiazepines: NOT DETECTED
Cocaine: NOT DETECTED
Tetrahydrocannabinol: NOT DETECTED

## 2013-05-16 LAB — ACETAMINOPHEN LEVEL: Acetaminophen (Tylenol), Serum: <15 ug/mL (ref 10–30)

## 2013-05-16 LAB — SALICYLATE LEVEL: Salicylate Lvl: <2 mg/dL — ABNORMAL LOW (ref 2.8–20.0)

## 2013-05-16 LAB — PREGNANCY, URINE: Preg Test, Ur: POSITIVE — AB

## 2013-05-16 MED ORDER — ZIPRASIDONE MESYLATE 20 MG IM SOLR
10.0000 mg | Freq: Once | INTRAMUSCULAR | Status: AC
  Administered 2013-05-16: 10 mg via INTRAMUSCULAR

## 2013-05-16 MED ORDER — SODIUM CHLORIDE 0.9 % IV BOLUS (SEPSIS)
1000.0000 mL | Freq: Once | INTRAVENOUS | Status: AC
  Administered 2013-05-16: 1000 mL via INTRAVENOUS

## 2013-05-16 MED ORDER — STERILE WATER FOR INJECTION IJ SOLN
INTRAMUSCULAR | Status: AC
  Administered 2013-05-16: 20:00:00
  Filled 2013-05-16: qty 10

## 2013-05-16 MED ORDER — ZIPRASIDONE MESYLATE 20 MG IM SOLR
20.0000 mg | Freq: Once | INTRAMUSCULAR | Status: AC
  Administered 2013-05-16: 20 mg via INTRAMUSCULAR
  Filled 2013-05-16: qty 20

## 2013-05-16 MED ORDER — ZIPRASIDONE MESYLATE 20 MG IM SOLR
INTRAMUSCULAR | Status: AC
  Administered 2013-05-16: 20 mg via INTRAMUSCULAR
  Filled 2013-05-16: qty 20

## 2013-05-16 NOTE — ED Notes (Signed)
Attempted to place PIV to obtain labs and give IVF Patient moving and bending arm while attempting to place PIV Patient refusing to hold still for additional attempts Will make charge nurse and MD aware
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
## 2013-05-16 NOTE — ED Notes (Signed)
Able to place PIV site NS given, see MAR

## 2013-05-16 NOTE — ED Notes (Signed)
FHT 168 bpm MD at bedside and is aware
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
## 2013-05-16 NOTE — ED Notes (Signed)
[place] tech at bedside Patient informed of POC and became agitated, verbally/physically aggressive, attempting to leave room Able to redirect patient so that [place] testing can be performed

## 2013-05-16 NOTE — ED Notes (Signed)
Patient arrives via police for medical clearance
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
## 2013-05-16 NOTE — ED Notes (Signed)
Patient refusing VS at this time

## 2013-05-16 NOTE — ED Notes (Signed)
Bed: ZO10 Expected date:  Expected time:  Means of arrival:  Comments: Mental health pt

## 2013-05-17 ENCOUNTER — Encounter (HOSPITAL_COMMUNITY): Payer: Self-pay | Admitting: Emergency Medicine

## 2013-05-17 ENCOUNTER — Encounter: Payer: Medicare Other | Admitting: Advanced Practice Midwife

## 2013-05-17 DIAGNOSIS — B009 Herpesviral infection, unspecified: Secondary | ICD-10-CM | POA: Diagnosis not present

## 2013-05-17 DIAGNOSIS — O10019 Pre-existing essential hypertension complicating pregnancy, unspecified trimester: Secondary | ICD-10-CM | POA: Diagnosis present

## 2013-05-17 DIAGNOSIS — O09529 Supervision of elderly multigravida, unspecified trimester: Secondary | ICD-10-CM | POA: Diagnosis not present

## 2013-05-17 DIAGNOSIS — Z331 Pregnant state, incidental: Secondary | ICD-10-CM | POA: Diagnosis not present

## 2013-05-17 DIAGNOSIS — F29 Unspecified psychosis not due to a substance or known physiological condition: Secondary | ICD-10-CM | POA: Diagnosis present

## 2013-05-17 DIAGNOSIS — E0590 Thyrotoxicosis, unspecified without thyrotoxic crisis or storm: Secondary | ICD-10-CM

## 2013-05-17 DIAGNOSIS — O355XX0 Maternal care for (suspected) damage to fetus by drugs, not applicable or unspecified: Secondary | ICD-10-CM | POA: Diagnosis not present

## 2013-05-17 DIAGNOSIS — F209 Schizophrenia, unspecified: Secondary | ICD-10-CM | POA: Diagnosis not present

## 2013-05-17 DIAGNOSIS — F319 Bipolar disorder, unspecified: Secondary | ICD-10-CM | POA: Diagnosis not present

## 2013-05-17 DIAGNOSIS — F259 Schizoaffective disorder, unspecified: Secondary | ICD-10-CM | POA: Diagnosis present

## 2013-05-17 DIAGNOSIS — E872 Acidosis: Secondary | ICD-10-CM

## 2013-05-17 DIAGNOSIS — Z1389 Encounter for screening for other disorder: Secondary | ICD-10-CM | POA: Diagnosis not present

## 2013-05-17 DIAGNOSIS — R7989 Other specified abnormal findings of blood chemistry: Secondary | ICD-10-CM

## 2013-05-17 DIAGNOSIS — F429 Obsessive-compulsive disorder, unspecified: Secondary | ICD-10-CM | POA: Diagnosis present

## 2013-05-17 DIAGNOSIS — R000 Tachycardia, unspecified: Secondary | ICD-10-CM | POA: Diagnosis not present

## 2013-05-17 DIAGNOSIS — O98519 Other viral diseases complicating pregnancy, unspecified trimester: Secondary | ICD-10-CM | POA: Diagnosis not present

## 2013-05-17 DIAGNOSIS — O99330 Smoking (tobacco) complicating pregnancy, unspecified trimester: Secondary | ICD-10-CM | POA: Diagnosis present

## 2013-05-17 DIAGNOSIS — O99340 Other mental disorders complicating pregnancy, unspecified trimester: Secondary | ICD-10-CM | POA: Diagnosis present

## 2013-05-17 DIAGNOSIS — I10 Essential (primary) hypertension: Secondary | ICD-10-CM | POA: Diagnosis not present

## 2013-05-17 DIAGNOSIS — E079 Disorder of thyroid, unspecified: Secondary | ICD-10-CM | POA: Diagnosis present

## 2013-05-17 DIAGNOSIS — R6889 Other general symptoms and signs: Secondary | ICD-10-CM | POA: Diagnosis not present

## 2013-05-17 LAB — HEPATIC FUNCTION PANEL
ALT: 242 U/L — ABNORMAL HIGH (ref 0–35)
Albumin: 3.1 g/dL — ABNORMAL LOW (ref 3.5–5.2)
Alkaline Phosphatase: 123 U/L — ABNORMAL HIGH (ref 39–117)
Bilirubin, Direct: 0.2 mg/dL (ref 0.0–0.3)
Indirect Bilirubin: 0.3 mg/dL (ref 0.3–0.9)
Total Protein: 6.9 g/dL (ref 6.0–8.3)

## 2013-05-17 LAB — BLOOD GAS, VENOUS
Acid-base deficit: 17.2 mmol/L — ABNORMAL HIGH (ref 0.0–2.0)
FIO2: 0.21 %
O2 Saturation: 83.6 %
Patient temperature: 98.6
pCO2, Ven: 24.3 mmHg — ABNORMAL LOW (ref 45.0–50.0)
pO2, Ven: 55.8 mmHg — ABNORMAL HIGH (ref 30.0–45.0)

## 2013-05-17 LAB — URINALYSIS, ROUTINE W REFLEX MICROSCOPIC
Bilirubin Urine: NEGATIVE
Hgb urine dipstick: NEGATIVE
Nitrite: NEGATIVE
Specific Gravity, Urine: 1.024 (ref 1.005–1.030)
pH: 5.5 (ref 5.0–8.0)

## 2013-05-17 LAB — BASIC METABOLIC PANEL
BUN: 14 mg/dL (ref 6–23)
BUN: 14 mg/dL (ref 6–23)
Calcium: 9 mg/dL (ref 8.4–10.5)
Chloride: 100 mEq/L (ref 96–112)
Chloride: 101 mEq/L (ref 96–112)
Creatinine, Ser: 1.06 mg/dL (ref 0.50–1.10)
GFR calc Af Amer: 78 mL/min — ABNORMAL LOW (ref >90)
GFR calc non Af Amer: 69 mL/min — ABNORMAL LOW (ref >90)
GFR calc non Af Amer: 67 mL/min — ABNORMAL LOW (ref >90)
Glucose, Bld: 143 mg/dL — ABNORMAL HIGH (ref 70–99)
Potassium: 4.1 mEq/L (ref 3.5–5.1)
Sodium: 130 mEq/L — ABNORMAL LOW (ref 135–145)
Sodium: 131 mEq/L — ABNORMAL LOW (ref 135–145)

## 2013-05-17 LAB — CG4 I-STAT (LACTIC ACID): Lactic Acid, Venous: 0.83 mmol/L (ref 0.5–2.2)

## 2013-05-17 LAB — LACTATE DEHYDROGENASE: LDH: 340 U/L — ABNORMAL HIGH (ref 94–250)

## 2013-05-17 LAB — TSH: TSH: 0.013 u[IU]/mL — ABNORMAL LOW (ref 0.350–4.500)

## 2013-05-17 LAB — CK: Total CK: 1334 U/L — ABNORMAL HIGH (ref 7–177)

## 2013-05-17 LAB — OSMOLALITY: Osmolality: 280 mOsm/kg (ref 275–300)

## 2013-05-17 LAB — HAPTOGLOBIN: Haptoglobin: 55 mg/dL (ref 45–215)

## 2013-05-17 MED ORDER — SODIUM CHLORIDE 0.9 % IV BOLUS (SEPSIS)
1000.0000 mL | Freq: Once | INTRAVENOUS | Status: AC
  Administered 2013-05-17: 1000 mL via INTRAVENOUS

## 2013-05-17 MED ORDER — ZIPRASIDONE MESYLATE 20 MG IM SOLR
20.0000 mg | Freq: Once | INTRAMUSCULAR | Status: AC
  Administered 2013-05-17: 20 mg via INTRAMUSCULAR
  Filled 2013-05-17: qty 20

## 2013-05-17 MED ORDER — WHITE PETROLATUM GEL
Status: DC | PRN
  Administered 2013-05-17: 1 via TOPICAL
  Filled 2013-05-17 (×2): qty 5

## 2013-05-17 MED ORDER — LORAZEPAM 1 MG PO TABS: 1.0000 mg | ORAL_TABLET | Freq: Three times a day (TID) | ORAL | Status: DC | PRN

## 2013-05-17 MED ORDER — ZOLPIDEM TARTRATE 5 MG PO TABS: 5.0000 mg | ORAL_TABLET | Freq: Every evening | ORAL | Status: DC | PRN

## 2013-05-17 MED ORDER — ONDANSETRON HCL 4 MG/2ML IJ SOLN
4.0000 mg | Freq: Once | INTRAMUSCULAR | Status: AC
  Administered 2013-05-17: 4 mg via INTRAVENOUS
  Filled 2013-05-17: qty 2

## 2013-05-17 MED ORDER — ONDANSETRON HCL 4 MG PO TABS
4.0000 mg | ORAL_TABLET | Freq: Three times a day (TID) | ORAL | Status: DC | PRN
Start: 2013-05-17 — End: 2013-05-17
  Administered 2013-05-17: 4 mg via ORAL
  Filled 2013-05-17: qty 1

## 2013-05-17 MED ORDER — NICOTINE 21 MG/24HR TD PT24
21.0000 mg | MEDICATED_PATCH | Freq: Every day | TRANSDERMAL | Status: DC
  Filled 2013-05-17: qty 1

## 2013-05-17 MED ORDER — STERILE WATER FOR INJECTION IJ SOLN
INTRAMUSCULAR | Status: AC
  Administered 2013-05-17: 10 mL
  Filled 2013-05-17: qty 10
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
## 2013-05-17 NOTE — ED Notes (Signed)
Since patient's arrival, patient has been offered food/drink and has refused MD aware

## 2013-05-17 NOTE — ED Notes (Signed)
Patient ambulated to bathroom without difficulty or assistance--gait steady

## 2013-05-17 NOTE — ED Notes (Signed)
Report given to [place], RN in Psych ED Patient in NAD upon leaving main ED End of assignment

## 2013-05-17 NOTE — ED Notes (Signed)
Patient ambulated back to room from bathroom without difficulty and with steady gait Patient much more calm and cooperative at this time--apologized for behavior and thanked staff for helping her Will not reapply restraints at this time due to patient meets criteria for discontinued use Will make MD aware

## 2013-05-17 NOTE — ED Notes (Signed)
Patient went to restroom to cleans out area around left eye. No acute distress noted.

## 2013-05-17 NOTE — ED Notes (Signed)
Patient arrive to unit via ambulatory with steady gait. Patient has small dressing covering left eye where her prosthetic eye was removed.

## 2013-05-17 NOTE — Progress Notes (Signed)
Dr Freida Busman reports that the patient is not medically cleared to be seen by the Psychiatrist.

## 2013-05-17 NOTE — ED Notes (Addendum)
Christine Cobb   Cell [phone]            pts [employer], pt gave consent for  release of medical information                             Home [phone]           Please don't leave details on voicemail

## 2013-05-17 NOTE — ED Notes (Signed)
Per MD, patient can go to Psych ED

## 2013-05-17 NOTE — ED Notes (Signed)
Patient vomiting in bathroom at 1125. Patient sates that the earlier received zofran did "not help at all." Patient offered crackers and ginger ale. Patient states that she wants to "rest now." Patient remains delusional and states that the "venom from the snake bites" made her nauseous. Patient currently resting in room with no distress noted. Will continue to monitor patient for safety.

## 2013-05-17 NOTE — ED Notes (Signed)
Per MD orders, patient transferred to medical ED side for fluids. Patient was labile during transfer and remained hyperverbal and tangential. Report was given to patient's RN, Faith-Marie.

## 2013-05-17 NOTE — ED Provider Notes (Addendum)
  Physical Exam  BP 102/67  Pulse 118  Temp(Src) 97.9 F (36.6 C) (Oral)  Resp 20  SpO2 95%  LMP 11/28/2012  Physical Exam  ED Course  Procedures  MDM  Pt comes in psychotic. Has hx of bipolar disorder. Pt signed out to me. Low bicarb noted  - VBG shows lactic acidosis, with anion gap. Tox screen is neg, alcohol is neg, tylenol, salicylates are neg - essentially MUDPILES is not suggestive of AG acidosis. Unsure why she has liver enz are elevated. But doesn't appear to be acute problem. No concerns for HELLP syndrome.  Serum osm and lytes ordered. Serum osm ordered to see if there is any possibility of toxic ingestions. Medicine consult placed for their input and help towards medical clearance vs admission. Dr. Georgiana Spinner see patient.  OB consulted. Spoke with Dr. Despina Hidden.  Derwood Kaplan, MD 05/17/13 [id number]  Derwood Kaplan, MD 05/17/13 [phone]

## 2013-05-17 NOTE — ED Notes (Signed)
Patient resting in position of comfort with eyes closed RR WNL--even and unlabored with equal rise and fall of chest Patient in NAD  
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
## 2013-05-17 NOTE — Consult Note (Signed)
[facility] Medical Consultation  Christine Cobb:[id number] DOB: December 27, 1975 DOA: 05/16/2013   PCP: Burtis Junes, MD    Requesting physician: ED physicians Date of consultation: 05/17/2013 Reason for consultation: Abnormal labs  Chief Complaint: "I am in danger"  HPI: Christine Cobb is a 37 y.o. female with a past medical history of bipolar disorder with schizoaffective disorder, along with a history of hyperthyroidism, who was brought in last night stating that people were trying to kill her and that she was bit by seven snakes. Patient was found to have pressured speech, flight of ideas and was delusional. She was brought in by Patent examiner. She was seen by emergency room physicians, and she underwent evaluation. Hospitalist was called this morning because of abnormal labs. These labs were obtained last night. I recommended to the ED physician to repeat the labs. When I went down to see the patient she was sitting on her bed. She clearly had pressured speech and flight of ideas. It was very difficult to obtain history from her but she did say that she used to be on some injection, possibly Risperdal, up until May for her psychiatric illness. And, then she stopped taking those medications because she got pregnant. She also was on Propylthiouracil for hyperthyroidism and she tells me that she hasn't taken this in the last 6 days, but this history is not reliable. But then during the middle of this conversation patient got agitated and belligerent. She walked out of her room. We did manage to get her back in the room for examination, but she was not very cooperative. So history and examination is extremely limited due to her mental status. Most of the information mentioned below is obtained from previous records.   Home Medications: Prior to Admission medications   Medication Sig Start Date End Date Taking? Authorizing Provider  ciprofloxacin (CILOXAN) 0.3 % ophthalmic  solution Place 2 drops into the left eye every 2 (two) hours while awake. Administer 1 drop, every 2 hours, while awake, for 2 days. Then 1 drop, every 4 hours, while awake, for the next 5 days. 05/07/13  Yes Minta Balsam, MD  propylthiouracil (PTU) 50 MG tablet Take 100 mg by mouth daily.   Yes Historical Provider, MD    Allergies:  Allergies  Allergen Reactions  . Amoxil [Amoxicillin] Anaphylaxis  . Trazodone And Nefazodone Anaphylaxis  . Eggs Or Egg-Derived Products Hives and Swelling  . Ketorolac Tromethamine Hives and Swelling  . Prenatal [B Complex Vitamins Plus] Nausea Only  . Tegretol [Carbamazepine] Other (See Comments)    Swelling, skin peeling, skin discoloration    Past Medical History: Past Medical History  Diagnosis Date  . Sinus tachycardia   . HTN (hypertension)   . Arm pain   . Eye globe prosthesis   . CHF (congestive heart failure)   . Bipolar affective disorder, currently manic, mild   . Hyperthyroidism     Past Surgical History  Procedure Laterality Date  . Eye surgery    . Dilation and curettage of uterus      Social History:  Unknown  Family History:  Family History  Problem Relation Age of Onset  . Hypertension Other   . Emphysema Other   . Asthma Son   . Diabetes Maternal Uncle   . Diabetes Paternal Grandmother      Review of Systems - unable to obtain due to her mental status  Physical Examination: Filed Vitals:   05/17/13 1312 05/17/13 1345 05/17/13 1421  05/17/13 1505  BP: 109/67 121/72 115/99 106/60  Pulse: 128  108 117  Temp:      TempSrc:      Resp: 18 16 18 17   SpO2: 100% 100% 99% 95%   Examination was very limited as the patient refused cooperation. General appearance: alert, appears stated age, combative and no distress Head: Normocephalic, without obvious abnormality, atraumatic Eyes: Artificial left eye Resp: clear to auscultation bilaterally Cardio: S1-S2 was tachycardic. Regular. No S3, S4. No rubs, murmurs, or  bruits. GI: Unable to examine fully, but the abdomen was soft, without any obvious tenderness. Neurologic: She was alert. She had pressured speech. Orientation could not be tested. No obvious motor strength deficits were noted.  Laboratory Data: Results for orders placed during the hospital encounter of 05/16/13 (from the past 48 hour(s))  URINE RAPID DRUG SCREEN (HOSP PERFORMED)     Status: None   Collection Time    05/16/13  7:45 PM      Result Value Range   Opiates NONE DETECTED  NONE DETECTED   Cocaine NONE DETECTED  NONE DETECTED   Benzodiazepines NONE DETECTED  NONE DETECTED   Amphetamines NONE DETECTED  NONE DETECTED   Tetrahydrocannabinol NONE DETECTED  NONE DETECTED   Barbiturates NONE DETECTED  NONE DETECTED   Comment:            DRUG SCREEN FOR MEDICAL PURPOSES     ONLY.  IF CONFIRMATION IS NEEDED     FOR ANY PURPOSE, NOTIFY LAB     WITHIN 5 DAYS.                LOWEST DETECTABLE LIMITS     FOR URINE DRUG SCREEN     Drug Class       Cutoff (ng/mL)     Amphetamine      1000     Barbiturate      200     Benzodiazepine   200     Tricyclics       300     Opiates          300     Cocaine          300     THC              50  PREGNANCY, URINE     Status: Abnormal   Collection Time    05/16/13  7:45 PM      Result Value Range   Preg Test, Ur POSITIVE (*) NEGATIVE   Comment:            THE SENSITIVITY OF THIS     METHODOLOGY IS >20 mIU/mL.  URINALYSIS, ROUTINE W REFLEX MICROSCOPIC     Status: Abnormal   Collection Time    05/16/13  7:45 PM      Result Value Range   Color, Urine YELLOW  YELLOW   APPearance CLOUDY (*) CLEAR   Specific Gravity, Urine 1.024  1.005 - 1.030   pH 5.5  5.0 - 8.0   Glucose, UA NEGATIVE  NEGATIVE mg/dL   Hgb urine dipstick NEGATIVE  NEGATIVE   Bilirubin Urine NEGATIVE  NEGATIVE   Ketones, ur >80 (*) NEGATIVE mg/dL   Protein, ur NEGATIVE  NEGATIVE mg/dL   Urobilinogen, UA 0.2  0.0 - 1.0 mg/dL   Nitrite NEGATIVE  NEGATIVE   Leukocytes,  UA NEGATIVE  NEGATIVE   Comment: MICROSCOPIC NOT DONE ON URINES WITH NEGATIVE PROTEIN, BLOOD, LEUKOCYTES, NITRITE, OR GLUCOSE <1000  mg/dL.  ACETAMINOPHEN LEVEL     Status: None   Collection Time    05/16/13  8:55 PM      Result Value Range   Acetaminophen (Tylenol), Serum <15.0  10 - 30 ug/mL   Comment:            THERAPEUTIC CONCENTRATIONS VARY     SIGNIFICANTLY. A RANGE OF 10-30     ug/mL MAY BE AN EFFECTIVE     CONCENTRATION FOR MANY PATIENTS.     HOWEVER, SOME ARE BEST TREATED     AT CONCENTRATIONS OUTSIDE THIS     RANGE.     ACETAMINOPHEN CONCENTRATIONS     >150 ug/mL AT 4 HOURS AFTER     INGESTION AND >50 ug/mL AT 12     HOURS AFTER INGESTION ARE     OFTEN ASSOCIATED WITH TOXIC     REACTIONS.  CBC     Status: Abnormal   Collection Time    05/16/13  8:55 PM      Result Value Range   WBC 16.7 (*) 4.0 - 10.5 K/uL   RBC 3.85 (*) 3.87 - 5.11 MIL/uL   Hemoglobin 11.0 (*) 12.0 - 15.0 g/dL   HCT 40.9 (*) 81.1 - 91.4 %   MCV 84.9  78.0 - 100.0 fL   MCH 28.6  26.0 - 34.0 pg   MCHC 33.6  30.0 - 36.0 g/dL   RDW 78.2  95.6 - 21.3 %   Platelets 333  150 - 400 K/uL  COMPREHENSIVE METABOLIC PANEL     Status: Abnormal   Collection Time    05/16/13  8:55 PM      Result Value Range   Sodium 132 (*) 135 - 145 mEq/L   Comment: REPEATED TO VERIFY   Potassium 3.6  3.5 - 5.1 mEq/L   Chloride 96  96 - 112 mEq/L   Comment: REPEATED TO VERIFY   CO2 11 (*) 19 - 32 mEq/L   Comment: REPEATED TO VERIFY   Glucose, Bld 65 (*) 70 - 99 mg/dL   BUN 14  6 - 23 mg/dL   Creatinine, Ser 0.86  0.50 - 1.10 mg/dL   Calcium 9.1  8.4 - 57.8 mg/dL   Total Protein 6.7  6.0 - 8.3 g/dL   Albumin 3.1 (*) 3.5 - 5.2 g/dL   AST 469 (*) 0 - 37 U/L   ALT 242 (*) 0 - 35 U/L   Alkaline Phosphatase 123 (*) 39 - 117 U/L   Total Bilirubin 0.5  0.3 - 1.2 mg/dL   GFR calc non Af Amer 84 (*) >90 mL/min   GFR calc Af Amer >90  >90 mL/min   Comment: (NOTE)     The eGFR has been calculated using the CKD EPI equation.      This calculation has not been validated in all clinical situations.     eGFR's persistently <90 mL/min signify possible Chronic Kidney     Disease.  ETHANOL     Status: None   Collection Time    05/16/13  8:55 PM      Result Value Range   Alcohol, Ethyl (B) <11  0 - 11 mg/dL   Comment:            LOWEST DETECTABLE LIMIT FOR     SERUM ALCOHOL IS 11 mg/dL     FOR MEDICAL PURPOSES ONLY  SALICYLATE LEVEL     Status: Abnormal   Collection Time    05/16/13  8:55 PM      Result Value Range   Salicylate Lvl <2.0 (*) 2.8 - 20.0 mg/dL  TSH     Status: Abnormal   Collection Time    05/16/13  8:55 PM      Result Value Range   TSH <0.008 (*) 0.350 - 4.500 uIU/mL   Comment: Performed at [facility]  BLOOD GAS, VENOUS     Status: Abnormal   Collection Time    05/17/13  1:20 AM      Result Value Range   FIO2 0.21     pH, Ven 7.208 (*) 7.250 - 7.300   pCO2, Ven 24.3 (*) 45.0 - 50.0 mmHg   pO2, Ven 55.8 (*) 30.0 - 45.0 mmHg   Bicarbonate 9.3 (*) 20.0 - 24.0 mEq/L   TCO2 8.9  0 - 100 mmol/L   Acid-base deficit 17.2 (*) 0.0 - 2.0 mmol/L   O2 Saturation 83.6     Patient temperature 98.6     Collection site VEIN     Drawn by  EBONY WILLIS, PHLEBOTOMY      Sample type VEIN    CG4 I-STAT (LACTIC ACID)     Status: None   Collection Time    05/17/13  1:25 AM      Result Value Range   Lactic Acid, Venous 0.83  0.5 - 2.2 mmol/L  CK     Status: Abnormal   Collection Time    05/17/13  6:00 AM      Result Value Range   Total CK 1334 (*) 7 - 177 U/L  OSMOLALITY     Status: None   Collection Time    05/17/13  9:30 AM      Result Value Range   Osmolality 280  275 - 300 mOsm/kg   Comment: Performed at [facility]  HEPATIC FUNCTION PANEL     Status: Abnormal   Collection Time    05/17/13  9:30 AM      Result Value Range   Total Protein 6.9  6.0 - 8.3 g/dL   Albumin 3.1 (*) 3.5 - 5.2 g/dL   AST 409 (*) 0 - 37 U/L   ALT 242 (*) 0 - 35 U/L   Alkaline Phosphatase 123 (*)  39 - 117 U/L   Total Bilirubin 0.5  0.3 - 1.2 mg/dL   Bilirubin, Direct 0.2  0.0 - 0.3 mg/dL   Indirect Bilirubin 0.3  0.3 - 0.9 mg/dL  LACTATE DEHYDROGENASE     Status: Abnormal   Collection Time    05/17/13  9:30 AM      Result Value Range   LDH 340 (*) 94 - 250 U/L  BASIC METABOLIC PANEL     Status: Abnormal   Collection Time    05/17/13  9:47 AM      Result Value Range   Sodium 130 (*) 135 - 145 mEq/L   Potassium 4.1  3.5 - 5.1 mEq/L   Chloride 100  96 - 112 mEq/L   CO2 14 (*) 19 - 32 mEq/L   Glucose, Bld 143 (*) 70 - 99 mg/dL   BUN 14  6 - 23 mg/dL   Creatinine, Ser 8.11  0.50 - 1.10 mg/dL   Calcium 9.0  8.4 - 91.4 mg/dL   GFR calc non Af Amer 69 (*) >90 mL/min   GFR calc Af Amer 80 (*) >90 mL/min   Comment: (NOTE)     The eGFR has been calculated using the  CKD EPI equation.     This calculation has not been validated in all clinical situations.     eGFR's persistently <90 mL/min signify possible Chronic Kidney     Disease.  BLOOD GAS, VENOUS     Status: Abnormal (Preliminary result)   Collection Time    05/17/13  3:05 PM      Result Value Range   FIO2 0.21     O2 Content PENDING     pH, Ven 7.343 (*) 7.250 - 7.300   pCO2, Ven 35.4 (*) 45.0 - 50.0 mmHg   pO2, Ven 31.0  30.0 - 45.0 mmHg   Bicarbonate 18.7 (*) 20.0 - 24.0 mEq/L   TCO2 17.3  0 - 100 mmol/L   Acid-base deficit 5.8 (*) 0.0 - 2.0 mmol/L   O2 Saturation 55.8     Patient temperature 98.6     Collection site VEIN     Drawn by COLLECTED BY LABORATORY     Sample type VEIN      Imaging Studies: US Abdomen Complete  05/17/2013   CLINICAL DATA:  Abnormal liver function tests.  EXAM: ULTRASOUND ABDOMEN COMPLETE  COMPARISON:  None.  FINDINGS: Gallbladder  No gallstones or wall thickening visualized. No sonographic Murphy sign noted.  Common bile duct  Diameter: 5 mm.  Liver  1.2 cm echogenic lesion in the upper right lobe of the liver, beneath the dome of the diaphragm. It is difficult to assess posterior  acoustic transmission given there mirror image artifact projected on the lung.  IVC  No abnormality visualized.  Pancreas  Not visualized.  Spleen  Size and appearance within normal limits.  Right Kidney  Length: 8 cm.  Increased echogenicity diffusely.  No hydronephrosis.  Left Kidney  Length: 9 cm.  Increased echogenicity diffusely.  No hydronephrosis.  Abdominal aorta  No aneurysm visualized.  IMPRESSION: 1. No findings to explain elevated liver function tests. 2. Medical renal disease. 3. 12 mm mass in the right lobe of the liver, almost certainly a hemangioma given patient's age. If there is history of chronic liver disease, six-month follow-up sonography recommended.   Electronically Signed   By: Tiburcio Pea M.D.   On: 05/17/2013 01:36   US Ob Limited  05/17/2013   CLINICAL DATA:  Abdominal pain.  Pregnancy.  EXAM: LIMITED OBSTETRIC ULTRASOUND  FINDINGS: Number of Fetuses: 1  Heart Rate:  147 bpm  Movement: Yes  Presentation: Variable transverse  Placental Location: The anterior  Previa: No  Amniotic Fluid (Subjective):  Lower normal range.  BPD:  5.6cm 23w  1d  MATERNAL FINDINGS:  Cervix:  Appears closed.  Uterus/Adnexae:  No abnormality visualized.  IMPRESSION: 1. Single living intrauterine pregnancy without complicating feature observed on limited assessment. Amniotic fluid volume is in the lower normal range.  This exam is performed on an emergent basis and does not comprehensively evaluate fetal size, dating, or anatomy; follow-up complete OB US should be considered if further fetal assessment is warranted.  COMPARISON: COMPARISON  05/09/2013   Electronically Signed   By: Herbie Baltimore M.D.   On: 05/17/2013 01:54    EKG: None available  Impression/Recommendations  Active Problems:   Psychotic disorder   This is a 37 year old, African American female, who was brought in for acute psychosis. Labs were obtained yesterday evening, which were significant for metabolic acidosis. She was  also noted to have transaminitis. She's also noted to be about [redacted] weeks pregnant. Reason for metabolic derangements are not clear. Tox screen is  negative. Salicylate and acetaminophen levels are normal. TSH was checked and was less than 0.008. Total CK was elevated at 1003 and 34.  I would be concerned about pregnancy related complications such as HELLP syndrome or other complications. Though she does have normal platelet counts. I recommended to the ED physician to obtain the OB/GYN consultation. It was also noted that when she presented to the hospital last night she was hypotensive. Hypoperfusion could have played a role.   Reason for metabolic acidosis is not entirely clear. Tox screens have been unremarkable. Would recommend IV hydration. I also recommended discussion with pulmonary and critical care medicine.  TSH is extremely low. Patient could have thyroid storm. Free T4 level have been recommended. It doesn't appear like she has any infection. UA was unremarkable, though it was cloudy. Lactic acid level was normal.  Will recommend repeating LFTs. Will recommend acute hepatitis panel. Ultrasound. Abdomen was performed and there was a suggestion of hemangioma. This would not explain her current findings.  I would recommend the patient be primarily cared for by OB/GYN with critical care medicine input.   I would also recommend that psychiatrist be involved in this patient's care.  I was subsequently contacted by gynecologist, Dr. Penne Lash, who recommended that the patient be admitted to medical ICU. I requested that she speak to critical care medicine. Subsequently I spoke to Dr. Molli Knock with CCM, who did not feel comfortable with admitting this patient to [facility] due to lack of around-the-clock OB/GYN services in that hospital. He was going to discuss this further with the ED provider's.  At this point we will be available to answer any questions. However, I think the patient is best served  under different specialty.   [facility]  [facility] Pager [phone]  If 7PM-7AM, please contact night-coverage.  [email or web address] Password [facility]  05/17/2013, 3:38 PM

## 2013-05-17 NOTE — ED Provider Notes (Addendum)
Patient signed out to myself this morning with the plan to have OB and medicine consult for further recommendation.  Consults were made by night physician in am.  Patient was moved to psychiatric ED early this am.  Blood work concerning with LFT and Cr elevated, platelets normal.  Bolus fluids, LDH added on.   Concern for HELLP vs preeclampsia vs dehydration vs other in addition to acute psychosis and psychiatric hx. Patient removed her IV, blood work pending, difficulty obtaining repeat blood work.   Delay due to acute psychosis and difficulty obtaining blood work. Multiple discussions with consultants, patient and nursing.   CRITICAL CARE Performed by: Enid Skeens   Total critical care time: 120 min  Critical care time was exclusive of separately billable procedures and treating other patients.  Critical care was necessary to treat or prevent imminent or life-threatening deterioration.  Critical care was time spent personally by me on the following activities: development of treatment plan with patient and/or surrogate as well as nursing, discussions with consultants, evaluation of patient's response to treatment, examination of patient, obtaining history from patient or surrogate, ordering and performing treatments and interventions, ordering and review of laboratory studies, ordering and review of radiographic studies, pulse oximetry and re-evaluation of patient's condition.  Discussed with senior nursing to ensure blood work completed.  Patient evaluated, acute psychosis persisted clinically, patient refusing treatment- yelling at staff. With current pregnancy staff and myself attempted de-escalation techniques to avoid using medicines with potential for harm to fetus however with medical concerns patient needs IV fluids and transfer for the safety of herself and fetus.  Discussed with OB Dr Tyron Russell, she agreed with transfer after IV fluids started.   Labs reviewed, no  significant improvement.  Pt placed in restraints to obtain medical care. IV bolus in ED.   Transfer to ICU at [facility]  Hospitalist assisted and helpful in ED with evaluation and recommendations.  Filed Vitals:   05/17/13 1312  BP: 109/67  Pulse: 128  Temp:   Resp: 18    Transfer arranged  Enid Skeens, MD 05/17/13 1341  Tachycardia persisted, Repeat boluses of NS, pt dry mm/ tachycardic.  Pt more comfortable on recheck, No abd or chest pain, abd soft/ gravid, neck supple.  Second IV US guided by myself.  Multiple discussions with OB.  No indication for fetal monitoring at this time as mother's medical status needs to be corrected prior to considering early C section.  OB specialists discussed case and spoke with Critical Care and hospitalist.  Concern for possible thyroid storm vs OB related vs dehydration/ rhabdo vs other.  Spoke with [facility] Critical care physician, they recommend transfer to tertiary center.   Plan changed to transfer to tertiary OB/ fetal medicine center, awaiting confirmation. Spoke with OBGYN, rec [facility], they will discuss details with maternal fetal at [facility]. Paged ICU [facility] and OB [facility] to discuss treatment options in ED until transfer done, re: thyroid storm treatment/ B blockers at this time.   Emergency Ultrasound Study:   Angiocath insertion Performed by: Enid Skeens  Consent: Verbal consent obtained. Risks and benefits: risks, benefits and alternatives were discussed Immediately prior to procedure the correct patient, procedure, equipment, support staff and site/side marked as needed.  Indication: difficult IV access Preparation: Patient was prepped and draped in the usual sterile fashion. Vein Location: right ac vein was visualized during assessment for potential access sites and was found to be patent/ easily compressed with linear ultrasound.  The needle  was visualized with real-time ultrasound and guided into the vein. Gauge: 20  guage  Image saved and stored.  Normal blood return.  Patient tolerance: Patient tolerated the procedure well with no immediate complications. EMERGENCY DEPARTMENT [place] PREGNANCY "Study: Limited Ultrasound of the Pelvis for Pregnancy"  INDICATIONS:Pregnancy(required) and Tachycardia Multiple views of the uterus and pelvic cavity were obtained in real-time with a multi-frequency probe.  APPROACH:Transabdominal   PERFORMED BY: Myself  IMAGES ARCHIVED?: Yes  LIMITATIONS: acute psychosis  PREGNANCY FREE FLUID: None   PREGNANCY FINDINGS: Intrauterine gestational sac noted and Fetal heart activity seen  INTERPRETATION: Viable intrauterine pregnancy    FETAL HEART RATE: 171     Rechecked, patient more comfortable, mild tachycardia, mild agitation.  Bedside US showed fetal movement and fhr 171. Spoke with OB resident at [facility], she spoke with attending, accepted patient to [facility]. Spoke with local OB, Dr Chalmers Cater accepted as attending at [facility]. Complicated patient, dispo finally arranged after a full day mix of critical care, re-evaluations, acute psychosis treatment, multiple phone calls.    Transfer arranged then received another call from [facility] at [facility], they were considering having psychiatry take primary care admission and then they would be consulted.  I did not feel comfortable with that plan since patient is not medically clear at this time.  I recommended placing patient on step down or ICU until vitals and official diagnosis made or patient improved.  Patient is not to go to general floor.  Resident will speak with attendings and coordinate admission.    LFT elevation, Hyperthyroidism, acute psychosis, CRF, Dehydration, pregnancy second trimester, sinus tachycardia.   Enid Skeens, MD 05/17/13 [id number]

## 2013-05-17 NOTE — ED Notes (Signed)
Charge RN at bedside establishing IV.

## 2013-05-17 NOTE — ED Notes (Signed)
MD Zavitz at bedside  

## 2013-05-17 NOTE — ED Notes (Signed)
Patient in room requesting pen and paper to "write to the church." Patient appears disorganized and is having flight of ideas. Patient is delusional and states that she is a Public relations account executive, Clinical research associate and a Designer, jewellery." Patient remains pleasant and cooperative with staff. Will continue to monitor patient for safety.
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
## 2013-05-17 NOTE — ED Notes (Signed)
Pt put in wrist restraints while IV started, released after IV started and medication given. Pt given meal tray.

## 2013-05-17 NOTE — ED Notes (Signed)
Patient refusing ABG and due to behavioral issues, patient not appropriate for arterial stick Per MD, OK to use venous blood to obtain blood gas
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
## 2013-05-17 NOTE — ED Notes (Signed)
Bed: ZO10 Expected date:  Expected time:  Means of arrival:  Comments: [facility] room 41

## 2013-05-17 NOTE — Progress Notes (Signed)
Called by OB/GYN regarding patient.  She felt that patient was better served in a medical ICU.  All labs reviewed but never saw actual patient.  OB/GYN informed me that fetus is viable but looks terrible.  After reviewing labs spoke with Dr. Rito Ehrlich and EDP.  The patient is evidently per their evaluation appears safe for transfer.  While I welcome taking care of the patient my concern is bringing a patient with a viable but "terrible" looking patient to our ICU without immediate and on site coverage form OB and MFM would not be in the patient's best interest.  I firmly believe that patient would be better served in a tertiary care center where MFM is available 24/7 to respond to any emergencies since patient looks unhealthy but viable due to maternal acidosis.  I will leave no further recommendations at this point given the fact that I did not assess the patient.  I will be happy to assist if further assistance is needed please feel free to call or if patient becomes unstable then will be happy to get involved.  Alyson Reedy, M.D. [facility] Pulmonary/Critical Care Medicine. Pager: [phone]. After hours pager: [phone].
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
## 2013-05-17 NOTE — ED Notes (Signed)
Pt being transferred to [facility] 4th floor L&D.  Dr Brynda Greathouse will be expecting pt

## 2013-05-17 NOTE — ED Notes (Addendum)
[facility] transporting [facility] to [facility] with [employer].

## 2013-05-17 NOTE — ED Notes (Signed)
Pt will not allow writer to draw any blood work

## 2013-05-18 LAB — BLOOD GAS, VENOUS
Acid-base deficit: 5.8 mmol/L — ABNORMAL HIGH (ref 0.0–2.0)
FIO2: 0.21 %
O2 Saturation: 55.8 %
TCO2: 17.3 mmol/L (ref 0–100)
pCO2, Ven: 35.4 mmHg — ABNORMAL LOW (ref 45.0–50.0)
pO2, Ven: 31 mmHg (ref 30.0–45.0)

## 2013-05-22 NOTE — Consult Note (Signed)
Note reviewed and agreed with  

## 2013-05-24 DIAGNOSIS — I10 Essential (primary) hypertension: Secondary | ICD-10-CM | POA: Diagnosis not present

## 2013-05-24 DIAGNOSIS — E079 Disorder of thyroid, unspecified: Secondary | ICD-10-CM | POA: Diagnosis not present

## 2013-05-24 DIAGNOSIS — R000 Tachycardia, unspecified: Secondary | ICD-10-CM | POA: Diagnosis not present

## 2013-05-24 DIAGNOSIS — F259 Schizoaffective disorder, unspecified: Secondary | ICD-10-CM | POA: Diagnosis not present

## 2013-06-01 ENCOUNTER — Inpatient Hospital Stay (HOSPITAL_COMMUNITY)
Admission: AD | Admit: 2013-06-01 | Discharge: 2013-06-01 | Disposition: A | Discharge status: Home / Self Care | Payer: Medicare Other | Source: Ambulatory Visit | Attending: Family Medicine | Admitting: Family Medicine

## 2013-06-01 ENCOUNTER — Inpatient Hospital Stay (HOSPITAL_COMMUNITY): Payer: Medicare Other

## 2013-06-01 ENCOUNTER — Encounter (HOSPITAL_COMMUNITY): Payer: Self-pay | Admitting: *Deleted

## 2013-06-01 DIAGNOSIS — F29 Unspecified psychosis not due to a substance or known physiological condition: Secondary | ICD-10-CM | POA: Diagnosis not present

## 2013-06-01 DIAGNOSIS — E039 Hypothyroidism, unspecified: Secondary | ICD-10-CM | POA: Diagnosis present

## 2013-06-01 DIAGNOSIS — F17200 Nicotine dependence, unspecified, uncomplicated: Secondary | ICD-10-CM | POA: Insufficient documentation

## 2013-06-01 DIAGNOSIS — O0992 Supervision of high risk pregnancy, unspecified, second trimester: Secondary | ICD-10-CM

## 2013-06-01 DIAGNOSIS — I498 Other specified cardiac arrhythmias: Secondary | ICD-10-CM | POA: Insufficient documentation

## 2013-06-01 DIAGNOSIS — F3111 Bipolar disorder, current episode manic without psychotic features, mild: Secondary | ICD-10-CM | POA: Insufficient documentation

## 2013-06-01 DIAGNOSIS — Z91012 Allergy to eggs: Secondary | ICD-10-CM | POA: Diagnosis not present

## 2013-06-01 DIAGNOSIS — Z881 Allergy status to other antibiotic agents status: Secondary | ICD-10-CM | POA: Diagnosis not present

## 2013-06-01 DIAGNOSIS — M79609 Pain in unspecified limb: Secondary | ICD-10-CM | POA: Insufficient documentation

## 2013-06-01 DIAGNOSIS — Z888 Allergy status to other drugs, medicaments and biological substances status: Secondary | ICD-10-CM | POA: Diagnosis not present

## 2013-06-01 DIAGNOSIS — O2690 Pregnancy related conditions, unspecified, unspecified trimester: Secondary | ICD-10-CM | POA: Diagnosis not present

## 2013-06-01 DIAGNOSIS — F429 Obsessive-compulsive disorder, unspecified: Secondary | ICD-10-CM | POA: Diagnosis present

## 2013-06-01 DIAGNOSIS — E0590 Thyrotoxicosis, unspecified without thyrotoxic crisis or storm: Secondary | ICD-10-CM | POA: Diagnosis not present

## 2013-06-01 DIAGNOSIS — Z23 Encounter for immunization: Secondary | ICD-10-CM | POA: Diagnosis not present

## 2013-06-01 DIAGNOSIS — R4182 Altered mental status, unspecified: Secondary | ICD-10-CM | POA: Diagnosis not present

## 2013-06-01 DIAGNOSIS — F259 Schizoaffective disorder, unspecified: Secondary | ICD-10-CM | POA: Diagnosis present

## 2013-06-01 DIAGNOSIS — O0990 Supervision of high risk pregnancy, unspecified, unspecified trimester: Secondary | ICD-10-CM | POA: Diagnosis not present

## 2013-06-01 DIAGNOSIS — O10912 Unspecified pre-existing hypertension complicating pregnancy, second trimester: Secondary | ICD-10-CM

## 2013-06-01 DIAGNOSIS — Z3490 Encounter for supervision of normal pregnancy, unspecified, unspecified trimester: Secondary | ICD-10-CM

## 2013-06-01 DIAGNOSIS — F309 Manic episode, unspecified: Secondary | ICD-10-CM | POA: Diagnosis present

## 2013-06-01 DIAGNOSIS — I10 Essential (primary) hypertension: Secondary | ICD-10-CM | POA: Diagnosis not present

## 2013-06-01 DIAGNOSIS — O99340 Other mental disorders complicating pregnancy, unspecified trimester: Secondary | ICD-10-CM | POA: Diagnosis present

## 2013-06-01 DIAGNOSIS — F4310 Post-traumatic stress disorder, unspecified: Secondary | ICD-10-CM | POA: Diagnosis present

## 2013-06-01 DIAGNOSIS — F319 Bipolar disorder, unspecified: Secondary | ICD-10-CM | POA: Diagnosis not present

## 2013-06-01 DIAGNOSIS — O09522 Supervision of elderly multigravida, second trimester: Secondary | ICD-10-CM

## 2013-06-01 LAB — COMPREHENSIVE METABOLIC PANEL
AST: 22 U/L (ref 0–37)
Albumin: 3.1 g/dL — ABNORMAL LOW (ref 3.5–5.2)
Alkaline Phosphatase: 106 U/L (ref 39–117)
BUN: 7 mg/dL (ref 6–23)
Potassium: 3.8 mEq/L (ref 3.5–5.1)
Total Protein: 7.1 g/dL (ref 6.0–8.3)

## 2013-06-01 LAB — CBC WITH DIFFERENTIAL/PLATELET
Basophils Absolute: 0.1 10*3/uL (ref 0.0–0.1)
Basophils Relative: 1 % (ref 0–1)
Eosinophils Absolute: 0.3 10*3/uL (ref 0.0–0.7)
Eosinophils Relative: 2 % (ref 0–5)
Hemoglobin: 10.7 g/dL — ABNORMAL LOW (ref 12.0–15.0)
MCH: 29.2 pg (ref 26.0–34.0)
MCHC: 33.6 g/dL (ref 30.0–36.0)
MCV: 86.6 fL (ref 78.0–100.0)
Monocytes Relative: 10 % (ref 3–12)
Neutro Abs: 7.2 10*3/uL (ref 1.7–7.7)
Neutrophils Relative %: 59 % (ref 43–77)
Platelets: 301 10*3/uL (ref 150–400)
RDW: 14.6 % (ref 11.5–15.5)

## 2013-06-01 LAB — RAPID URINE DRUG SCREEN, HOSP PERFORMED
Amphetamines: NOT DETECTED
Barbiturates: NOT DETECTED
Benzodiazepines: NOT DETECTED
Opiates: NOT DETECTED
Tetrahydrocannabinol: POSITIVE — AB

## 2013-06-01 LAB — URINALYSIS, ROUTINE W REFLEX MICROSCOPIC
Bilirubin Urine: NEGATIVE
Ketones, ur: NEGATIVE mg/dL
Leukocytes, UA: NEGATIVE
Nitrite: NEGATIVE
Protein, ur: NEGATIVE mg/dL
Urobilinogen, UA: 0.2 mg/dL (ref 0.0–1.0)

## 2013-06-01 LAB — ETHANOL: Alcohol, Ethyl (B): <11 mg/dL (ref 0–11)

## 2013-06-01 MED ORDER — ONDANSETRON HCL 4 MG PO TABS: 4.0000 mg | ORAL_TABLET | Freq: Three times a day (TID) | ORAL | Status: DC | PRN

## 2013-06-01 MED ORDER — ZIPRASIDONE MESYLATE 20 MG IM SOLR
20.0000 mg | Freq: Once | INTRAMUSCULAR | Status: AC
  Administered 2013-06-01: 20 mg via INTRAMUSCULAR

## 2013-06-01 MED ORDER — NICOTINE 21 MG/24HR TD PT24: 21.0000 mg | MEDICATED_PATCH | Freq: Every day | TRANSDERMAL | Status: DC

## 2013-06-01 MED ORDER — DIPHENHYDRAMINE HCL 25 MG PO CAPS: 25.0000 mg | ORAL_CAPSULE | Freq: Four times a day (QID) | ORAL | Status: DC | PRN

## 2013-06-01 MED ORDER — HALOPERIDOL 5 MG PO TABS: 5.0000 mg | ORAL_TABLET | Freq: Three times a day (TID) | ORAL | Status: DC | PRN

## 2013-06-01 MED ORDER — VALACYCLOVIR HCL 500 MG PO TABS
500.0000 mg | ORAL_TABLET | Freq: Two times a day (BID) | ORAL | Status: DC
  Filled 2013-06-01 (×2): qty 1

## 2013-06-01 MED ORDER — PROPYLTHIOURACIL 50 MG PO TABS
100.0000 mg | ORAL_TABLET | Freq: Every day | ORAL | Status: DC
  Filled 2013-06-01: qty 2

## 2013-06-01 MED ORDER — ALUM & MAG HYDROXIDE-SIMETH 200-200-20 MG/5ML PO SUSP: 30.0000 mL | ORAL | Status: DC | PRN

## 2013-06-01 MED ORDER — LORAZEPAM 2 MG/ML IJ SOLN
2.0000 mg | Freq: Once | INTRAMUSCULAR | Status: AC
  Administered 2013-06-01: 2 mg via INTRAMUSCULAR
  Filled 2013-06-01: qty 1

## 2013-06-01 MED ORDER — HALOPERIDOL LACTATE 5 MG/ML IJ SOLN
5.0000 mg | Freq: Once | INTRAMUSCULAR | Status: AC
  Administered 2013-06-01: 5 mg via INTRAMUSCULAR
  Filled 2013-06-01: qty 1

## 2013-06-01 MED ORDER — ZIPRASIDONE HCL 20 MG PO CAPS
20.0000 mg | ORAL_CAPSULE | Freq: Two times a day (BID) | ORAL | Status: DC
  Filled 2013-06-01: qty 1

## 2013-06-01 MED ORDER — ZIPRASIDONE MESYLATE 20 MG IM SOLR
INTRAMUSCULAR | Status: AC
  Administered 2013-06-01: 20 mg via INTRAMUSCULAR
  Filled 2013-06-01: qty 20

## 2013-06-01 MED ORDER — DIPHENHYDRAMINE HCL 50 MG/ML IJ SOLN
50.0000 mg | Freq: Once | INTRAMUSCULAR | Status: AC
  Administered 2013-06-01: 50 mg via INTRAMUSCULAR
  Filled 2013-06-01: qty 1

## 2013-06-01 MED ORDER — HALOPERIDOL 5 MG PO TABS: 5.0000 mg | ORAL_TABLET | Freq: Two times a day (BID) | ORAL | Status: DC
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
## 2013-06-01 NOTE — ED Notes (Signed)
Pt refusing to lay in bed, pt is restrained but sitting up in bed. Pt will not allow staff to keep her covered. Pt cursing and shouting," Get the fuck out of my room." Security/[employer] outside room

## 2013-06-01 NOTE — Progress Notes (Signed)
B.Lashe Oliveira, MHT received report from Darlene at [facility] where patient has been recently treated.  Darlene reports an acceptance by Dr. Geanie Cooley patient has been assigned to [address] Bed 1. Darlene will Higher education careers adviser later on this evening when bed comes available. Nurse to nurse report number [phone]. IVC papers have been faxed to [facility]. At 5:15pm B.Lazette Estala confirmed with Shanda Bumps at [facility] that patient has been accepted and bed now available. Writer informed Jessie Foot, TTS who will inform attending nurse

## 2013-06-01 NOTE — MAU Note (Signed)
Pt removed monitors and refusing fetal monitoring @ this time.

## 2013-06-01 NOTE — ED Provider Notes (Addendum)
Pt currently alert, content. Normal vitals. Psych team requests u/s to assess pregnancy, to aid in placement process. Nurse states pt agreeable.   Psych team indicates pt accepted to [facility] by Dr Geanie Cooley.  Results for orders placed during the hospital encounter of 06/01/13  URINALYSIS, ROUTINE W REFLEX MICROSCOPIC      Result Value Range   Color, Urine YELLOW  YELLOW   APPearance CLOUDY (*) CLEAR   Specific Gravity, Urine <1.005 (*) 1.005 - 1.030   pH 6.0  5.0 - 8.0   Glucose, UA NEGATIVE  NEGATIVE mg/dL   Hgb urine dipstick NEGATIVE  NEGATIVE   Bilirubin Urine NEGATIVE  NEGATIVE   Ketones, ur NEGATIVE  NEGATIVE mg/dL   Protein, ur NEGATIVE  NEGATIVE mg/dL   Urobilinogen, UA 0.2  0.0 - 1.0 mg/dL   Nitrite NEGATIVE  NEGATIVE   Leukocytes, UA NEGATIVE  NEGATIVE  CBC WITH DIFFERENTIAL      Result Value Range   WBC 12.3 (*) 4.0 - 10.5 K/uL   RBC 3.67 (*) 3.87 - 5.11 MIL/uL   Hemoglobin 10.7 (*) 12.0 - 15.0 g/dL   HCT 95.6 (*) 21.3 - 08.6 %   MCV 86.6  78.0 - 100.0 fL   MCH 29.2  26.0 - 34.0 pg   MCHC 33.6  30.0 - 36.0 g/dL   RDW 57.8  46.9 - 62.9 %   Platelets 301  150 - 400 K/uL   Neutrophils Relative % 59  43 - 77 %   Neutro Abs 7.2  1.7 - 7.7 K/uL   Lymphocytes Relative 28  12 - 46 %   Lymphs Abs 3.5  0.7 - 4.0 K/uL   Monocytes Relative 10  3 - 12 %   Monocytes Absolute 1.3 (*) 0.1 - 1.0 K/uL   Eosinophils Relative 2  0 - 5 %   Eosinophils Absolute 0.3  0.0 - 0.7 K/uL   Basophils Relative 1  0 - 1 %   Basophils Absolute 0.1  0.0 - 0.1 K/uL  COMPREHENSIVE METABOLIC PANEL      Result Value Range   Sodium 135  135 - 145 mEq/L   Potassium 3.8  3.5 - 5.1 mEq/L   Chloride 99  96 - 112 mEq/L   CO2 20  19 - 32 mEq/L   Glucose, Bld 93  70 - 99 mg/dL   BUN 7  6 - 23 mg/dL   Creatinine, Ser 5.28  0.50 - 1.10 mg/dL   Calcium 9.5  8.4 - 41.3 mg/dL   Total Protein 7.1  6.0 - 8.3 g/dL   Albumin 3.1 (*) 3.5 - 5.2 g/dL   AST 22  0 - 37 U/L   ALT 19  0 - 35 U/L   Alkaline  Phosphatase 106  39 - 117 U/L   Total Bilirubin 0.3  0.3 - 1.2 mg/dL   GFR calc non Af Amer >90  >90 mL/min   GFR calc Af Amer >90  >90 mL/min  ETHANOL      Result Value Range   Alcohol, Ethyl (B) <11  0 - 11 mg/dL  URINE RAPID DRUG SCREEN (HOSP PERFORMED)      Result Value Range   Opiates NONE DETECTED  NONE DETECTED   Cocaine NONE DETECTED  NONE DETECTED   Benzodiazepines NONE DETECTED  NONE DETECTED   Amphetamines NONE DETECTED  NONE DETECTED   Tetrahydrocannabinol POSITIVE (*) NONE DETECTED   Barbiturates NONE DETECTED  NONE DETECTED     US Ob  Limited  06/01/2013   CLINICAL DATA:  Psychiatric evaluation  EXAM: LIMITED OBSTETRIC ULTRASOUND  FINDINGS: Number of Fetuses: 1  Heart Rate:  147 bpm  Movement: Yes  Presentation: cephalic  Placental Location: anterior  Previa: No  Amniotic Fluid (Subjective): Decreased  BPD:  6.6cm 26w  5d  MATERNAL FINDINGS:  Cervix:  Difficult to assess due to low lying head  Uterus/Adnexae:  No abnormality visualized.  Other:  Patient terminated scan.  IMPRESSION: Single living intrauterine gestation. The estimated gestational age is 26 weeks and 5 days.  This exam is performed on an emergent basis and does not comprehensively evaluate fetal size, dating, or anatomy; follow-up complete OB US should be considered if further fetal assessment is warranted.   Electronically Signed   By: Signa Kell M.D.   On: 06/01/2013 16:48      Suzi Roots, MD 06/01/13 [id number]

## 2013-06-01 NOTE — ED Provider Notes (Signed)
CSN: [id number]     Arrival date & time 06/01/13  0409 History   First MD Initiated Contact with Patient 06/01/13 [phone]     Chief Complaint  Patient presents with  . Psychiatric Evaluation   (Consider location/radiation/quality/duration/timing/severity/associated sxs/prior Treatment) HPI Level 5 Caveat: psychotic. This is a 37 year old female with a history of psychosis seen here on 05/17/2013 for the same. She was seen by OB/GYN who reported that per ultrasound the "fetus was viable"; pulmonary critical care was consulted who felt the patient should be on a maternal-fetal medicine service. She was reportedly transferred to [facility] by enforcement later that day.  This morning she presented to Maternity Admission Unit at [facility] showing signs of active psychosis. The nurse practitioner at [facility] reported to me by phone that the patient's pregnancy appears stable but that they wish to transport the patient here for acute psychosis. She described labile behavior alternating between calm and cooperative and agitated and combative. Before she could be transferred to this facility she ran naked out of [facility] into the nearby [place] bathroom. Police were summoned and she was brought by EMS to this facility. On arrival she is singing gospel hymns alternating with periods of profanity and combativeness. She is unable to give a coherent history.  Past Medical History  Diagnosis Date  . Sinus tachycardia   . HTN (hypertension)   . Arm pain   . Eye globe prosthesis   . CHF (congestive heart failure)   . Bipolar affective disorder, currently manic, mild   . Hyperthyroidism    Past Surgical History  Procedure Laterality Date  . Eye surgery    . Dilation and curettage of uterus     Family History  Problem Relation Age of Onset  . Hypertension Other   . Emphysema Other   . Asthma Son   . Diabetes Maternal Uncle   . Diabetes  Paternal Grandmother    History  Substance Use Topics  . Smoking status: Current Every Day Smoker -- 0.25 packs/day    Types: Cigarettes  . Smokeless tobacco: Never Used  . Alcohol Use: 3.0 oz/week    2 Cans of beer, 3 Shots of liquor per week     Comment: daily, currently 01/01/13-   LAST TIME HAD BEER-  WAS IN MAY 2014-  WAS IN AA MEETINGS.   OB History   Grav Para Term Preterm Abortions TAB SAB Ect Mult Living   4 1 1  2 1 1   1      Review of Systems  Unable to perform ROS   Allergies  Amoxil; Trazodone and nefazodone; Eggs or egg-derived products; Ketorolac tromethamine; Prenatal; and Tegretol  Home Medications   Current Outpatient Rx  Name  Route  Sig  Dispense  Refill  . haloperidol (HALDOL) 5 MG tablet      7.5 mg.         . metoprolol succinate (TOPROL-XL) 25 MG 24 hr tablet      25 mg.         . ondansetron (ZOFRAN-ODT) 8 MG disintegrating tablet      8 mg.         . propylthiouracil (PTU) 50 MG tablet   Oral   Take 100 mg by mouth daily.         . valACYclovir (VALTREX) 500 MG tablet   Oral   Take 500 mg by mouth 2 (two) times daily.         Marland Kitchen  zolpidem (AMBIEN) 10 MG tablet   Oral   Take 10 mg by mouth at bedtime as needed for sleep.          BP 138/98  Pulse 127  Temp(Src) 98.6 F (37 C) (Oral)  Resp 16  SpO2 100%  LMP 11/28/2012  Physical Exam General: Well-developed, well-nourished female; appears older than age of record HENT: normocephalic; atraumatic; mucous membranes moist; patient spitting Eyes: right of normal appearance; left prosthesis Neck: supple Heart: regular rate and rhythm; tachycardic Lungs: clear to auscultation bilaterally Abdomen: soft; nontender; gravid, consistent with dates bowel sounds present Extremities: No deformity; full range of motion; pulses normal Neurologic: Awake, alert; motor function intact in all extremities and symmetric; no facial droop Skin: Warm and dry Psychiatric: Agitated;  uncooperative; labile mood alternating between singing gospel tunes and yelling obscenities    ED Course  Procedures (including critical care time)  MDM   Nursing notes and vitals signs, including pulse oximetry, reviewed.  Summary of this visit's results, reviewed by myself:  Labs:  Results for orders placed during the hospital encounter of 06/01/13 (from the past 24 hour(s))  URINALYSIS, ROUTINE W REFLEX MICROSCOPIC     Status: Abnormal   Collection Time    06/01/13  2:00 AM      Result Value Range   Color, Urine YELLOW  YELLOW   APPearance CLOUDY (*) CLEAR   Specific Gravity, Urine <1.005 (*) 1.005 - 1.030   pH 6.0  5.0 - 8.0   Glucose, UA NEGATIVE  NEGATIVE mg/dL   Hgb urine dipstick NEGATIVE  NEGATIVE   Bilirubin Urine NEGATIVE  NEGATIVE   Ketones, ur NEGATIVE  NEGATIVE mg/dL   Protein, ur NEGATIVE  NEGATIVE mg/dL   Urobilinogen, UA 0.2  0.0 - 1.0 mg/dL   Nitrite NEGATIVE  NEGATIVE   Leukocytes, UA NEGATIVE  NEGATIVE  CBC WITH DIFFERENTIAL     Status: Abnormal   Collection Time    06/01/13  4:33 AM      Result Value Range   WBC 12.3 (*) 4.0 - 10.5 K/uL   RBC 3.67 (*) 3.87 - 5.11 MIL/uL   Hemoglobin 10.7 (*) 12.0 - 15.0 g/dL   HCT 45.4 (*) 09.8 - 11.9 %   MCV 86.6  78.0 - 100.0 fL   MCH 29.2  26.0 - 34.0 pg   MCHC 33.6  30.0 - 36.0 g/dL   RDW 14.7  82.9 - 56.2 %   Platelets 301  150 - 400 K/uL   Neutrophils Relative % 59  43 - 77 %   Neutro Abs 7.2  1.7 - 7.7 K/uL   Lymphocytes Relative 28  12 - 46 %   Lymphs Abs 3.5  0.7 - 4.0 K/uL   Monocytes Relative 10  3 - 12 %   Monocytes Absolute 1.3 (*) 0.1 - 1.0 K/uL   Eosinophils Relative 2  0 - 5 %   Eosinophils Absolute 0.3  0.0 - 0.7 K/uL   Basophils Relative 1  0 - 1 %   Basophils Absolute 0.1  0.0 - 0.1 K/uL  COMPREHENSIVE METABOLIC PANEL     Status: Abnormal   Collection Time    06/01/13  4:33 AM      Result Value Range   Sodium 135  135 - 145 mEq/L   Potassium 3.8  3.5 - 5.1 mEq/L   Chloride 99  96 -  112 mEq/L   CO2 20  19 - 32 mEq/L   Glucose, Bld 93  70 - 99 mg/dL   BUN 7  6 - 23 mg/dL   Creatinine, Ser 1.61  0.50 - 1.10 mg/dL   Calcium 9.5  8.4 - 09.6 mg/dL   Total Protein 7.1  6.0 - 8.3 g/dL   Albumin 3.1 (*) 3.5 - 5.2 g/dL   AST 22  0 - 37 U/L   ALT 19  0 - 35 U/L   Alkaline Phosphatase 106  39 - 117 U/L   Total Bilirubin 0.3  0.3 - 1.2 mg/dL   GFR calc non Af Amer >90  >90 mL/min   GFR calc Af Amer >90  >90 mL/min  ETHANOL     Status: None   Collection Time    06/01/13  4:33 AM      Result Value Range   Alcohol, Ethyl (B) <11  0 - 11 mg/dL   0:45 AM Sleeping comfortably after 20 mg of Geodon IM and 50 mg of Benadryl IM. "Up to Date" was consulted and current studies are showing no significant fetal risk with Geodon. In the patient's agitated state and likely illicit drug use the benefits of using Geodon were considered justified in calling the patient down and allowing her to cooperate with her treatment.      Hanley Seamen, MD 06/01/13 [phone]

## 2013-06-01 NOTE — ED Notes (Signed)
Per EMS, pt. C/o generalized weakness and numbness in L hand. Pt. Has x of strokes and got worried. Negative Stroke scale. A&Ox4. NAD noted.Marland Kitchen

## 2013-06-01 NOTE — ED Notes (Addendum)
Pt is aggressive. Pt restrained with 4 point restraints and [employer] handcuffs. Pt actively spitting and deliberately spit on this writer as blood was being drawn. [employer] and [place] security at bedside

## 2013-06-01 NOTE — ED Notes (Signed)
Sheriffs [employer] at bedside to transport pt to [facility].

## 2013-06-01 NOTE — Consult Note (Signed)
Attempted to evaluate patient with Dr Tawni Carnes this am but was not able due to her agitation and aggresiveness.  Patient was yelling, spitting and was naked.  She was not able to keep her top clothing on and was using obscene language the short period this female Clinical research associate was with her.  Patient was yelling and asking this writer to leave her room and calling names that could not be repeated here  In this write up.  Writer and DR Tawni Carnes discussed with Dr Donato Heinz who is the EDP covering Psych today.  We all agreed that patient need stabilization and prompt referral to [facility] or [facility] where they have specialized care for pregnant women.  We will start seeking placement at this two hospitals as soon as possible. Plan: We will seek admission to any of the two facilities that can take care of pregnant  patients with acute Psychosis We will continue to treat patient with Haldol and PRN Ativan We will obtain fetal heart tone as soon as patient is calm and cooperative.  Dahlia Byes   PMHNP-BC

## 2013-06-01 NOTE — Progress Notes (Signed)
B.Machelle Raybon, MHT was requested by Marchelle Folks, TTS to seek placement for pregnant patient with acute psychosis. Writer contacted the following facilities listed below;   [facility] spoke with Melody who accepted referral for review Berton Lan spoke with Agustin Cree who reports one available female bed for acute psychosis Turner Daniels spoke with Thayer Ohm who is willing to review, referral has been faxed.

## 2013-06-01 NOTE — Consult Note (Addendum)
Since pt was psychotic and topless, this provider did not enter pt's room. Chart reviewed. Pt is IVC'ed. NP Christine Cobb had pulled the room curtain closed, and I could hear the pt state "kill me Jesus", "get out of my door", "get the fuck out of my room". Pt is currently pregnant. Pt told the security officer that she was bit by seven serpents, and was throwing up venom.  Case was discussed with EDP at length. Since haldol has the most extensive reproductive safety data, we agreed to continue scheduled haldol for pt. Pt was on haldol 7.5 mg daily at home, so EDP increased haldol to 5 mg bid for psychosis. Also, prn haldol and benadryl was ordered. Lorazepam may be used for agitation in conjunction with haldol, according to [facility] Practice Bulletin and Clinical Management Guidelines for Psychiatric Disorders During Pregnancy. Will pursue inpatient bed at [facility], or [facility] for safety/stabilization. NP Christine Cobb discussed this case with Christine Cobb, and Christine Cobb agrees with pt being transferred to psych ED once medically cleared. Pt will need 1:1 sitter for safety. If a paper gown (that cannot be tied or wrapped) is available, that may be used to cover her top.

## 2013-06-01 NOTE — ED Notes (Addendum)
Pt belongings placed into locker 28 x1

## 2013-06-01 NOTE — MAU Note (Signed)
Pt presents to MAU by EMS with multiple complaints. Pt is requesting medication refills. She appears anxious and has flight of thoughts. Pt states that she has smoked 7 packs of cigarettes in the last 24 hours, smoked marijuana. Pt requesting bedpan to "get the marijuana out of" her system. She says that she has not felt to baby moving and her thinks that her water may have broken. She also is stating the her baby is going to be born premature on Friday. Pt is coughing and spitting.

## 2013-06-01 NOTE — ED Notes (Signed)
Per EMS, Pt was admitted at [facility] hospital for complications with pregnancy. Pt. Left and was chased by security to [facility] across the street. Pt. sts she has used no drugs, but has had 9 packs of cigarettes in 24 hours. Pt. Refused any care from EMS. A&Ox4.

## 2013-06-01 NOTE — ED Notes (Signed)
Bed: WA03 Expected date: 06/01/13 Expected time: 3:50 AM Means of arrival: Ambulance Comments: Medical clearance

## 2013-06-01 NOTE — ED Notes (Signed)
Offered to feed patient. Patient refused; cussing, and demanding I leave the room. RN Darl Pikes aware.

## 2013-06-01 NOTE — ED Notes (Signed)
Pt is awake and alert. denies HI or SI. Pt is refusing all medication. Md notified. Will continue to monitor for safety

## 2013-06-01 NOTE — MAU Provider Note (Signed)
History     CSN: [id number]  Arrival date and time: 06/01/13 0121   First Provider Initiated Contact with Patient 06/01/13 0215      Chief Complaint  Patient presents with  . Medication Refill   HPI 37 yo G4P1021 at [redacted]w[redacted]d who came to MAU for an unclear reason. Patient is very agitated in the room and does not provide a clear complaint. She states "I smoke 9 packs of cigarettes and marijuana in the past 24 hours". The patient showed some pressured speech and became agitated in the room, throwing cups of ice across the floor. Patient asked me to leave several times.  OB History   Grav Para Term Preterm Abortions TAB SAB Ect Mult Living   4 1 1  2 1 1   1       Past Medical History  Diagnosis Date  . Sinus tachycardia   . HTN (hypertension)   . Arm pain   . Eye globe prosthesis   . CHF (congestive heart failure)   . Bipolar affective disorder, currently manic, mild   . Hyperthyroidism     Past Surgical History  Procedure Laterality Date  . Eye surgery    . Dilation and curettage of uterus      Family History  Problem Relation Age of Onset  . Hypertension Other   . Emphysema Other   . Asthma Son   . Diabetes Maternal Uncle   . Diabetes Paternal Grandmother     History  Substance Use Topics  . Smoking status: Current Every Day Smoker -- 0.25 packs/day    Types: Cigarettes  . Smokeless tobacco: Never Used  . Alcohol Use: 3.0 oz/week    2 Cans of beer, 3 Shots of liquor per week     Comment: daily, currently 01/01/13-   LAST TIME HAD BEER-  WAS IN MAY 2014-  WAS IN AA MEETINGS.    Allergies:  Allergies  Allergen Reactions  . Amoxil [Amoxicillin] Anaphylaxis  . Trazodone And Nefazodone Anaphylaxis  . Eggs Or Egg-Derived Products Hives and Swelling  . Ketorolac Tromethamine Hives and Swelling  . Prenatal [B Complex Vitamins Plus] Nausea Only  . Tegretol [Carbamazepine] Other (See Comments)    Swelling, skin peeling, skin discoloration    Prescriptions prior  to admission  Medication Sig Dispense Refill  . propylthiouracil (PTU) 50 MG tablet Take 100 mg by mouth daily.      . valACYclovir (VALTREX) 500 MG tablet Take 500 mg by mouth 2 (two) times daily.      Marland Kitchen zolpidem (AMBIEN) 10 MG tablet Take 10 mg by mouth at bedtime as needed for sleep.      . ciprofloxacin (CILOXAN) 0.3 % ophthalmic solution Place 2 drops into the left eye every 2 (two) hours while awake. Administer 1 drop, every 2 hours, while awake, for 2 days. Then 1 drop, every 4 hours, while awake, for the next 5 days.  5 mL  0    ROS Physical Exam   Blood pressure 114/80, pulse 116, temperature 98.3 F (36.8 C), temperature source Oral, resp. rate 20, last menstrual period 11/28/2012.  Physical Exam Declined by patient FHT: baseline 135, mod variability, no accels, no decels. Appropriate for gestational age Toco: no contractions MAU Course  Procedures  MDM   Assessment and Plan  37 yo G4P1021 at [redacted]w[redacted]d with history of psychiatric disorder  - Based on limited evaluation, there does not appear to be any obstetrical issues. Patient declined evaluation - Will transfer  to psych ED. Case discussed with Dr. Laure Kidney who is the accepting physician - Will ensure that patient is scheduled for a prenatal appointment next week, as it appears that she has missed an appointment  Bryahna Lesko 06/01/2013, 2:19 AM

## 2013-06-01 NOTE — ED Notes (Signed)
Changed pt wet bed and scrubs. Pt uncooperative, cursing and stating "I just want my breakfast, get out and stop talking to me." Pt in NAD

## 2013-06-01 NOTE — ED Provider Notes (Signed)
10:00 AM Patient became agitated and cursing at staff. I ordered ativan and haldol IM for sedation. Psych at bedside and will try to look for inpatient psych facilities that take pregnant patients.   Richardean Canal, MD 06/01/13 1001

## 2013-06-19 ENCOUNTER — Encounter: Payer: Medicare Other | Admitting: Family Medicine

## 2013-06-24 ENCOUNTER — Encounter (HOSPITAL_COMMUNITY): Payer: Self-pay | Admitting: Emergency Medicine

## 2013-06-24 ENCOUNTER — Emergency Department (HOSPITAL_COMMUNITY)
Admission: EM | Admit: 2013-06-24 | Discharge: 2013-06-24 | Disposition: A | Discharge status: Home / Self Care | Payer: Medicare Other | Attending: Emergency Medicine | Admitting: Emergency Medicine

## 2013-06-24 DIAGNOSIS — Z8659 Personal history of other mental and behavioral disorders: Secondary | ICD-10-CM | POA: Insufficient documentation

## 2013-06-24 DIAGNOSIS — O169 Unspecified maternal hypertension, unspecified trimester: Secondary | ICD-10-CM | POA: Diagnosis not present

## 2013-06-24 DIAGNOSIS — I2510 Atherosclerotic heart disease of native coronary artery without angina pectoris: Secondary | ICD-10-CM | POA: Insufficient documentation

## 2013-06-24 DIAGNOSIS — I509 Heart failure, unspecified: Secondary | ICD-10-CM | POA: Insufficient documentation

## 2013-06-24 DIAGNOSIS — E079 Disorder of thyroid, unspecified: Secondary | ICD-10-CM | POA: Insufficient documentation

## 2013-06-24 DIAGNOSIS — O98819 Other maternal infectious and parasitic diseases complicating pregnancy, unspecified trimester: Secondary | ICD-10-CM | POA: Diagnosis not present

## 2013-06-24 DIAGNOSIS — L0231 Cutaneous abscess of buttock: Secondary | ICD-10-CM | POA: Diagnosis not present

## 2013-06-24 DIAGNOSIS — E0590 Thyrotoxicosis, unspecified without thyrotoxic crisis or storm: Secondary | ICD-10-CM | POA: Diagnosis not present

## 2013-06-24 DIAGNOSIS — Z8739 Personal history of other diseases of the musculoskeletal system and connective tissue: Secondary | ICD-10-CM | POA: Diagnosis not present

## 2013-06-24 DIAGNOSIS — Z79899 Other long term (current) drug therapy: Secondary | ICD-10-CM | POA: Insufficient documentation

## 2013-06-24 DIAGNOSIS — L0233 Carbuncle of buttock: Secondary | ICD-10-CM | POA: Diagnosis not present

## 2013-06-24 DIAGNOSIS — O99330 Smoking (tobacco) complicating pregnancy, unspecified trimester: Secondary | ICD-10-CM | POA: Diagnosis not present

## 2013-06-24 MED ORDER — ACETAMINOPHEN 500 MG PO TABS: 500.0000 mg | ORAL_TABLET | Freq: Four times a day (QID) | ORAL | Status: DC | PRN

## 2013-06-24 MED ORDER — ACETAMINOPHEN 325 MG PO TABS
650.0000 mg | ORAL_TABLET | Freq: Once | ORAL | Status: AC
  Administered 2013-06-24: 650 mg via ORAL
  Filled 2013-06-24: qty 2

## 2013-06-24 NOTE — ED Provider Notes (Signed)
Medical screening examination/treatment/procedure(s) were performed by non-physician practitioner and as supervising physician I was immediately available for consultation/collaboration.  EKG Interpretation   None        Shon Baton, MD 06/24/13 [phone]

## 2013-06-24 NOTE — ED Notes (Signed)
Patient states that she has a boil on her buttock

## 2013-06-24 NOTE — ED Provider Notes (Signed)
CSN: [id number]     Arrival date & time 06/24/13  0554 History   First MD Initiated Contact with Patient 06/24/13 0747     Chief Complaint  Patient presents with  . abcess    (Consider location/radiation/quality/duration/timing/severity/associated sxs/prior Treatment) The history is provided by the patient.  Pt reports two days of a "boil" on her left buttock.  States she picked at it and squeezed it but doesn't think anything came out.  Has tried no other treatment.  Denies fevers, chills, body aches, vomiting.  Is currrently pregnant - no concerns with pregnancy at this time.    Past Medical History  Diagnosis Date  . Sinus tachycardia   . HTN (hypertension)   . Arm pain   . Eye globe prosthesis   . CHF (congestive heart failure)   . Bipolar affective disorder, currently manic, mild   . Hyperthyroidism    Past Surgical History  Procedure Laterality Date  . Eye surgery    . Dilation and curettage of uterus     Family History  Problem Relation Age of Onset  . Hypertension Other   . Emphysema Other   . Asthma Son   . Diabetes Maternal Uncle   . Diabetes Paternal Grandmother    History  Substance Use Topics  . Smoking status: Current Every Day Smoker -- 0.25 packs/day    Types: Cigarettes  . Smokeless tobacco: Never Used  . Alcohol Use: 3.0 oz/week    2 Cans of beer, 3 Shots of liquor per week     Comment: daily, currently 01/01/13-   LAST TIME HAD BEER-  WAS IN MAY 2014-  WAS IN AA MEETINGS.   OB History   Grav Para Term Preterm Abortions TAB SAB Ect Mult Living   4 1 1  2 1 1   1      Review of Systems  Constitutional: Negative for fever and chills.  Gastrointestinal: Negative for vomiting, abdominal pain, diarrhea, constipation and blood in stool.  Skin: Negative for color change.    Allergies  Amoxil; Trazodone and nefazodone; Ketorolac tromethamine; Prenatal; and Tegretol  Home Medications   Current Outpatient Rx  Name  Route  Sig  Dispense  Refill  .  acetaminophen (TYLENOL) 500 MG tablet   Oral   Take 1 tablet (500 mg total) by mouth every 6 (six) hours as needed.   30 tablet   0   . haloperidol (HALDOL) 5 MG tablet      7.5 mg.         . metoprolol succinate (TOPROL-XL) 25 MG 24 hr tablet      25 mg.         . propylthiouracil (PTU) 50 MG tablet      200 mg.         . valACYclovir (VALTREX) 500 MG tablet   Oral   Take 500 mg by mouth 2 (two) times daily.         Marland Kitchen zolpidem (AMBIEN) 10 MG tablet   Oral   Take 10 mg by mouth at bedtime as needed for sleep.          BP 94/55  Pulse 129  Temp(Src) 98.1 F (36.7 C) (Oral)  SpO2 99%  LMP 11/28/2012 Physical Exam  Nursing note and vitals reviewed. Constitutional: She appears well-developed and well-nourished. No distress.  HENT:  Head: Normocephalic and atraumatic.  Neck: Neck supple.  Pulmonary/Chest: Effort normal.  Neurological: She is alert.  Skin: She is not diaphoretic.  ED Course  Procedures (including critical care time) Labs Review Labs Reviewed - No data to display Imaging Review No results found.  EKG Interpretation   None      8:17 AM Bedside ultrasound shows no fluid collection currently   MDM   1. Left buttock abscess    Pt with small early left buttock abscess.  Not ready to be drained yet.  No overlying cellulitis.  No systemic symptoms.  Pt is pregnant.  Discussed conservative home treatment with return for I&D if needed.  Pt was in a hurry throughout visit, told us to hurry up so she could go to church.  Pt given tylenol following discussion with her about medications and safety with medications in pregnancy.  Discussed findings, treatment, and follow up  with patient.  Pt given return precautions.  Pt verbalizes understanding and agrees with plan.       [place], PA-C 06/24/13 [phone]

## 2013-06-28 NOTE — L&D Delivery Note (Signed)
Delivery Note At 9:17 AM a viable female was delivered via SVD (Presentation:OA ;LOA  ).  APGAR: 9 and 9; crying baby placed on mother's chest. Weight 6lb 4oz.   Placenta status: Spontaneously delivered intact.  Cord: pending. Cord pH: Not collected  Anesthesia: Epidural  Episiotomy: N/A Lacerations: N/A Suture Repair: N/A Est. Blood Loss (mL): 350  Mom to postpartum.  Baby to Couplet care / Skin to Skin.  [name] was noted to be complete and delivered a viable baby boy after pushing over 3 contractions. Uterus was noted to be boggy near fundus, and 800 mg cytotec was given PR. Uterus firming, and bleeding ceased. Counts correct x2.   Christine Cobb 09/02/2013, 9:48 AM   I was present for delivery and agree with note above. [facility]

## 2013-06-29 ENCOUNTER — Emergency Department (HOSPITAL_COMMUNITY)
Admission: EM | Admit: 2013-06-29 | Discharge: 2013-06-30 | Discharge status: Against Medical Advice | Payer: Medicare Other | Attending: Emergency Medicine | Admitting: Emergency Medicine

## 2013-06-29 ENCOUNTER — Encounter (HOSPITAL_COMMUNITY): Payer: Self-pay | Admitting: Emergency Medicine

## 2013-06-29 DIAGNOSIS — F17200 Nicotine dependence, unspecified, uncomplicated: Secondary | ICD-10-CM | POA: Diagnosis not present

## 2013-06-29 DIAGNOSIS — Z331 Pregnant state, incidental: Secondary | ICD-10-CM | POA: Diagnosis not present

## 2013-06-29 DIAGNOSIS — R609 Edema, unspecified: Secondary | ICD-10-CM | POA: Diagnosis not present

## 2013-06-29 DIAGNOSIS — Z8679 Personal history of other diseases of the circulatory system: Secondary | ICD-10-CM | POA: Diagnosis not present

## 2013-06-29 DIAGNOSIS — Z970 Presence of artificial eye: Secondary | ICD-10-CM | POA: Insufficient documentation

## 2013-06-29 DIAGNOSIS — R059 Cough, unspecified: Secondary | ICD-10-CM | POA: Insufficient documentation

## 2013-06-29 DIAGNOSIS — R0602 Shortness of breath: Secondary | ICD-10-CM | POA: Insufficient documentation

## 2013-06-29 DIAGNOSIS — F3111 Bipolar disorder, current episode manic without psychotic features, mild: Secondary | ICD-10-CM | POA: Insufficient documentation

## 2013-06-29 DIAGNOSIS — R05 Cough: Secondary | ICD-10-CM | POA: Insufficient documentation

## 2013-06-29 DIAGNOSIS — I10 Essential (primary) hypertension: Secondary | ICD-10-CM | POA: Insufficient documentation

## 2013-06-29 DIAGNOSIS — E0590 Thyrotoxicosis, unspecified without thyrotoxic crisis or storm: Secondary | ICD-10-CM | POA: Diagnosis not present

## 2013-06-29 DIAGNOSIS — I509 Heart failure, unspecified: Secondary | ICD-10-CM | POA: Diagnosis not present

## 2013-06-29 LAB — CBC WITH DIFFERENTIAL/PLATELET
Basophils Absolute: 0.1 10*3/uL (ref 0.0–0.1)
Basophils Relative: 0 % (ref 0–1)
EOS ABS: 0.3 10*3/uL (ref 0.0–0.7)
Eosinophils Relative: 3 % (ref 0–5)
HCT: 29.4 % — ABNORMAL LOW (ref 36.0–46.0)
HEMOGLOBIN: 9.9 g/dL — AB (ref 12.0–15.0)
Lymphocytes Relative: 23 % (ref 12–46)
Lymphs Abs: 2.6 10*3/uL (ref 0.7–4.0)
MCH: 28.7 pg (ref 26.0–34.0)
MCHC: 33.7 g/dL (ref 30.0–36.0)
MCV: 85.2 fL (ref 78.0–100.0)
Monocytes Absolute: 1.1 10*3/uL — ABNORMAL HIGH (ref 0.1–1.0)
Monocytes Relative: 10 % (ref 3–12)
NEUTROS ABS: 7.1 10*3/uL (ref 1.7–7.7)
NEUTROS PCT: 64 % (ref 43–77)
PLATELETS: 272 10*3/uL (ref 150–400)
RBC: 3.45 MIL/uL — ABNORMAL LOW (ref 3.87–5.11)
RDW: 14.2 % (ref 11.5–15.5)
WBC: 11.2 10*3/uL — AB (ref 4.0–10.5)

## 2013-06-29 LAB — COMPREHENSIVE METABOLIC PANEL
ALBUMIN: 2.4 g/dL — AB (ref 3.5–5.2)
ALT: 40 U/L — AB (ref 0–35)
AST: 27 U/L (ref 0–37)
Alkaline Phosphatase: 109 U/L (ref 39–117)
BUN: 5 mg/dL — ABNORMAL LOW (ref 6–23)
CHLORIDE: 104 meq/L (ref 96–112)
CO2: 19 mEq/L (ref 19–32)
Calcium: 8.4 mg/dL (ref 8.4–10.5)
Creatinine, Ser: 0.7 mg/dL (ref 0.50–1.10)
GFR calc Af Amer: >90 mL/min (ref >90)
GFR calc non Af Amer: >90 mL/min (ref >90)
Glucose, Bld: 105 mg/dL — ABNORMAL HIGH (ref 70–99)
Potassium: 3.2 mEq/L — ABNORMAL LOW (ref 3.7–5.3)
SODIUM: 138 meq/L (ref 137–147)
TOTAL PROTEIN: 6.2 g/dL (ref 6.0–8.3)

## 2013-06-29 LAB — POCT I-STAT TROPONIN I: Troponin i, poc: 0 ng/mL (ref 0.00–0.08)

## 2013-06-29 LAB — PRO B NATRIURETIC PEPTIDE: Pro B Natriuretic peptide (BNP): <5 pg/mL (ref 0–125)

## 2013-06-29 NOTE — ED Notes (Signed)
Pt. reports progressing SOB with occasional dry cough and bilateral lower leg edema/swelling onset yesterday . Pt. stated she has CHF and she is 7 months pregnant .

## 2013-06-30 ENCOUNTER — Inpatient Hospital Stay (HOSPITAL_COMMUNITY)
Admission: AD | Admit: 2013-06-30 | Discharge: 2013-06-30 | Disposition: A | Discharge status: Home / Self Care | Payer: Medicare Other | Source: Ambulatory Visit | Attending: Obstetrics and Gynecology | Admitting: Obstetrics and Gynecology

## 2013-06-30 ENCOUNTER — Encounter (HOSPITAL_COMMUNITY): Payer: Self-pay | Admitting: *Deleted

## 2013-06-30 DIAGNOSIS — IMO0002 Reserved for concepts with insufficient information to code with codable children: Secondary | ICD-10-CM | POA: Insufficient documentation

## 2013-06-30 DIAGNOSIS — E876 Hypokalemia: Secondary | ICD-10-CM | POA: Insufficient documentation

## 2013-06-30 DIAGNOSIS — O99891 Other specified diseases and conditions complicating pregnancy: Secondary | ICD-10-CM | POA: Insufficient documentation

## 2013-06-30 DIAGNOSIS — O99330 Smoking (tobacco) complicating pregnancy, unspecified trimester: Secondary | ICD-10-CM | POA: Diagnosis not present

## 2013-06-30 DIAGNOSIS — O9989 Other specified diseases and conditions complicating pregnancy, childbirth and the puerperium: Principal | ICD-10-CM

## 2013-06-30 MED ORDER — POTASSIUM CHLORIDE ER 10 MEQ PO TBCR: 20.0000 meq | EXTENDED_RELEASE_TABLET | Freq: Every day | ORAL | Status: DC

## 2013-06-30 NOTE — Progress Notes (Signed)
Philipp Deputy CNM in earlier and discussed d/c plan with pt and potassium script. Written and verbal d/c instructions given and understanding voiced

## 2013-06-30 NOTE — MAU Provider Note (Signed)
History     CSN: [id number]  Arrival date and time: 06/30/13 0001   First Provider Initiated Contact with Patient 06/30/13 0149      Chief Complaint  Patient presents with  . Foot Swelling   HPI Christine Cobb is a 38yo [id number] at 29.4wks who presents for eval of 'fluid on ankles' and wanting that 'drained off'. She denies abd pain, ctx, leak, or bldg. No N/V/D or headache. No visual disburbances or RUQ pain. [facility] but has not been seen since 25wks. Via chart review, it seems as [facility] the beginning of Dec.  Pt seen at another ER tonight prior to coming here- labs drawn there- but felt the need to leave and be [facility].  OB History   Grav Para Term Preterm Abortions TAB SAB Ect Mult Living   4 1 1  2 1 1   1       Past Medical History  Diagnosis Date  . Sinus tachycardia   . HTN (hypertension)   . Arm pain   . Eye globe prosthesis   . CHF (congestive heart failure)   . Bipolar affective disorder, currently manic, mild   . Hyperthyroidism     Past Surgical History  Procedure Laterality Date  . Eye surgery    . Dilation and curettage of uterus      Family History  Problem Relation Age of Onset  . Hypertension Other   . Emphysema Other   . Asthma Son   . Diabetes Maternal Uncle   . Diabetes Paternal Grandmother     History  Substance Use Topics  . Smoking status: Current Every Day Smoker -- 0.25 packs/day    Types: Cigarettes  . Smokeless tobacco: Never Used  . Alcohol Use: 0.0 oz/week    Allergies:  Allergies  Allergen Reactions  . Amoxil [Amoxicillin] Anaphylaxis  . Trazodone And Nefazodone Anaphylaxis  . Ketorolac Tromethamine Hives and Swelling  . Prenatal [B Complex Vitamins Plus] Nausea Only  . Tegretol [Carbamazepine] Other (See Comments)    Swelling, skin peeling, skin discoloration    Prescriptions prior to admission  Medication Sig Dispense Refill  . acetaminophen (TYLENOL) 500 MG  tablet Take 1 tablet (500 mg total) by mouth every 6 (six) hours as needed.  30 tablet  0  . zolpidem (AMBIEN) 10 MG tablet Take 10 mg by mouth at bedtime as needed for sleep.      . haloperidol (HALDOL) 5 MG tablet Take 7.5 mg by mouth 3 (three) times daily.       . valACYclovir (VALTREX) 500 MG tablet Take 500 mg by mouth 2 (two) times daily.        ROS Physical Exam   Blood pressure 104/68, pulse 104, temperature 97.7 F (36.5 C), resp. rate 20, last menstrual period 11/28/2012.  Physical Exam  Constitutional: She is oriented to person, place, and time. She appears well-developed.  HENT:  Head: Normocephalic.  Neck: Normal range of motion.  Cardiovascular: Normal rate.   Respiratory: Effort normal.  GI: Soft.  EFM 120-130s +accels, no decels Rare ctx  Genitourinary:  Pelvic deferred  Musculoskeletal: Normal range of motion.  BLE with 1+ edema  Neurological: She is alert and oriented to person, place, and time.  Skin: Skin is warm and dry.  Psychiatric:  Pt very sleepy and reluctant to answer questions, but is able to give appropriate responses.   CBC  Component Value Date/Time   WBC 11.2* 06/29/2013 2255   RBC 3.45* 06/29/2013 2255   HGB 9.9* 06/29/2013 2255   HCT 29.4* 06/29/2013 2255   PLT 272 06/29/2013 2255   MCV 85.2 06/29/2013 2255   MCH 28.7 06/29/2013 2255   MCHC 33.7 06/29/2013 2255   RDW 14.2 06/29/2013 2255   LYMPHSABS 2.6 06/29/2013 2255   MONOABS 1.1* 06/29/2013 2255   EOSABS 0.3 06/29/2013 2255   BASOSABS 0.1 06/29/2013 2255   CMP     Component Value Date/Time   NA 138 06/29/2013 2255   K 3.2* 06/29/2013 2255   CL 104 06/29/2013 2255   CO2 19 06/29/2013 2255   GLUCOSE 105* 06/29/2013 2255   BUN 5* 06/29/2013 2255   CREATININE 0.70 06/29/2013 2255   CREATININE 0.66 04/19/2013 1354   CALCIUM 8.4 06/29/2013 2255   PROT 6.2 06/29/2013 2255   ALBUMIN 2.4* 06/29/2013 2255   AST 27 06/29/2013 2255   ALT 40* 06/29/2013 2255   ALKPHOS 109 06/29/2013 2255   BILITOT <0.2* 06/29/2013 2255    GFRNONAA >90 06/29/2013 2255   GFRAA >90 06/29/2013 2255     MAU Course  Procedures    Assessment and Plan  IUP at 29.4wks Mild hypokalemia  D/C home  Rx Kdur qd x 4 with instructions on K+ rich foods. Reassured re 'foot swelling' Watch LFTs (were 125/242 in November 2014) Instructed pt to call [facility] clinic and also inbox msg sent to clinic requesting an appt to be scheduled  SHAW, KIMBERLY 06/30/2013, 2:02 AM

## 2013-06-30 NOTE — MAU Provider Note (Signed)
Attestation of Attending Supervision of Advanced Practitioner (CNM/NP): Evaluation and management procedures were performed by the Advanced Practitioner under my supervision and collaboration.  I have reviewed the Advanced Practitioner's note and chart, and I agree with the management and plan.  Ida Uppal 06/30/2013 6:27 AM

## 2013-06-30 NOTE — MAU Note (Signed)
Feet swelling. I have phase 3 congestive heart failure

## 2013-06-30 NOTE — Progress Notes (Signed)
Philipp Deputy CNM notified of pt's admission and status. Will see pt

## 2013-06-30 NOTE — Discharge Instructions (Signed)
Hypokalemia Hypokalemia means a low potassium level in the blood.Potassium is an electrolyte that helps regulate the amount of fluid in the body. It also stimulates muscle contraction and maintains a stable acid-base balance.Most of the body's potassium is inside of cells, and only a very small amount is in the blood. Because the amount in the blood is so small, minor changes can have big effects. PREPARATION FOR TEST Testing for potassium requires taking a blood sample taken by needle from a vein in the arm. The skin is cleaned thoroughly before the sample is drawn. There is no other special preparation needed. NORMAL VALUES Potassium levels below 3.5 mEq/L are abnormally low. Levels above 5.1 mEq/L are abnormally high. Ranges for normal findings may vary among different laboratories and hospitals. You should always check with your doctor after having lab work or other tests done to discuss the meaning of your test results and whether your values are considered within normal limits. MEANING OF TEST  Your caregiver will go over the test results with you and discuss the importance and meaning of your results, as well as treatment options and the need for additional tests, if necessary. A potassium level is frequently part of a routine medical exam. It is usually included as part of a whole "panel" of tests for several blood salts (such as Sodium and Chloride). It may be done as part of follow-up when a low potassium level was found in the past or other blood salts are suspected of being out of balance. A low potassium level might be suspected if you have one or more of the following:  Symptoms of weakness.  Abnormal heart rhythms.  High blood pressure and are taking medication to control this, especially water pills (diuretics).  Kidney disease that can affect your potassium level .  Diabetes requiring the use of insulin. The potassium may fall after taking insulin, especially if the diabetes  had been out of control for a while.  A condition requiring the use of cortisone-type medication or certain types of antibiotics.  Vomiting and/or diarrhea for more than a day or two.  A stomach or intestinal condition that may not permit appropriate absorption of potassium.  Fainting episodes.  Mental confusion. OBTAINING TEST RESULTS It is your responsibility to obtain your test results. Ask the lab or department performing the test when and how you will get your results.  Please contact your caregiver directly if you have not received the results within one week. At that time, ask if there is anything different or new you should be doing in relation to the results. TREATMENT Hypokalemia can be treated with potassium supplements taken by mouth and/or adjustments in your current medications. A diet high in potassium is also helpful. Foods with high potassium content are:  Peas, lentils, lima beans, nuts, and dried fruit.  Whole grain and bran cereals and breads.  Fresh fruit, vegetables (bananas, cantaloupe, grapefruit, oranges, tomatoes, honeydew melons, potatoes).  Orange and tomato juices.  Meats. If potassium supplement has been prescribed for you today or your medications have been adjusted, see your personal caregiver in time02 for a re-check. SEEK MEDICAL CARE IF:  There is a feeling of worsening weakness.  You experience repeated chest palpitations.  You are diabetic and having difficulty keeping your blood sugars in the normal range.  You are experiencing vomiting and/or diarrhea.  You are having difficulty with any of your regular medications. SEEK IMMEDIATE MEDICAL CARE IF:  You experience chest pain, shortness of  breath, or episodes of dizziness.  You have been having vomiting or diarrhea for more than 2 days.  You have a fainting episode. MAKE SURE YOU:   Understand these instructions.  Will watch your condition.  Will get help right away if you are  not doing well or get worse. Document Released: 06/14/2005 Document Revised: 09/06/2011 Document Reviewed: 12/15/2012 [facility] Patient Information 2014 [place], [place].  Potassium Content of Foods Potassium is a mineral found in many foods and drinks. It helps keep fluids and minerals balanced in your body and also affects how steadily your heart beats. The body needs potassium to control blood pressure and to keep the muscles and nervous system healthy. However, certain health conditions and medicine may require you to eat more or less potassium-rich foods and drinks. Your caregiver or dietitian will tell you how much potassium you should have each day. COMMON SERVING SIZES The list below tells you how big or small common portion sizes are:  1 oz.........4 stacked dice.  3 oz........Marland KitchenDeck of cards.  1 tsp.......Marland KitchenTip of little finger.  1 tbsp....Marland KitchenMarland KitchenThumb.  2 tbsp....Marland KitchenMarland KitchenGolf ball.   c..........Marland KitchenHalf of a fist.  1 c...........Marland KitchenA fist. FOODS AND DRINKS HIGH IN POTASSIUM More than 200 mg of potassium per serving. A serving size is  c (120 mL or noted gram weight) unless otherwise stated. While all the items on this list are high in potassium, some items are higher in potassium than others. Fruits  Apricots (sliced), 83 g.  Apricots (dried halves), 3 oz / 24 g.  Avocado (cubed),  c / 50 g.  Banana (sliced), 75 g.  Cantaloupe (cubed), 80 g.  Dates (pitted), 5 whole / 35 g.  Figs (dried), 4 whole / 32 g.  Guava, c / 55 g.  Honeydew, 1 wedge / 85 g.  Kiwi (sliced), 90 g.  Nectarine, 1 small / 129 g.  Orange, 1 medium / 131 g.  Orange juice.  Pomegranate seeds, 87 g.  Pomegranate juice.  Prunes (pitted), 3 whole / 30 g.  Prune juice, 3 oz / 90 mL.  Seedless raisins, 3 tbsp / 27 g. Vegetables  Artichoke,  of a medium / 64 g.  Asparagus (boiled), 90 g.  Baked beans,  c / 63 g.  Bamboo shoots,  c / 38 g.  Beets (cooked slices), 85 g.  Broccoli (boiled),  78 g.  Brussels sprout (boiled), 78 g.  Butternut squash (baked), 103 g.  Chickpea (cooked), 82 g.  Green peas (cooked), 80 g.  Hubbard squash (baked cubes),  c / 68 g.  Kidney beans (cooked), 5 tbsp / 55 g.  Lima beans (cooked),  c / 43 g.  Navy beans (cooked),  c / 61 g.  Potato (baked), 61 g.  Potato (boiled), 78 g.  Pumpkin (boiled), 123 g.  Refried beans,  c / 79 g.  Spinach (cooked),  c / 45 g.  Split peas (cooked),  c / 65 g.  Sun-dried tomatoes, 2 tbsp / 7 g.  Sweet potato (baked),  c / 50 g.  Tomato (chopped or sliced), 90 g.  Tomato juice.  Tomato paste, 4 tsp / 21 g.  Tomato sauce,  c / 61 g.  Vegetable juice.  White mushrooms (cooked), 78 g.  Yam (cooked or baked),  c / 34 g.  Zucchini squash (boiled), 90 g. Other Foods and Drinks  Almonds (whole),  c / 36 g.  Cashews (oil roasted),  c / 32 g.  Chocolate milk.  Chocolate pudding, 142 g.  Clams (steamed), 1.5 oz / 43 g.  Dark chocolate, 1.5 oz / 42 g.  Fish, 3 oz / 85 g.  King crab (steamed), 3 oz / 85 g.  Lobster (steamed), 4 oz / 113 g.  Milk (skim, 1%, 2%, whole), 1 c / 240 mL.  Milk chocolate, 2.3 oz / 66 g.  Milk shake.  Nonfat fruit variety yogurt, 123 g.  Peanuts (oil roasted), 1 oz / 28 g.  Peanut butter, 2 tbsp / 32 g.  Pistachio nuts, 1 oz / 28 g.  Pumpkin seeds, 1 oz / 28 g.  Red meat (broiled, cooked, grilled), 3 oz / 85 g.  Scallops (steamed), 3 oz / 85 g.  Shredded wheat cereal (dry), 3 oblong biscuits / 75 g.  Spaghetti sauce,  c / 66 g.  Sunflower seeds (dry roasted), 1 oz / 28 g.  Veggie burger, 1 patty / 70 g. FOODS MODERATE IN POTASSIUM Between 150 mg and 200 mg per serving. A serving is  c (120 mL or noted gram weight) unless otherwise stated. Fruits  Grapefruit,  of the fruit / 123 g.  Grapefruit juice.  Pineapple juice.  Plums (sliced), 83 g.  Tangerine, 1 large / 120 g. Vegetables  Carrots (boiled), 78  g.  Carrots (sliced), 61 g.  Rhubarb (cooked with sugar), 120 g.  Rutabaga (cooked), 120 g.  Sweet corn (cooked), 75 g.  Yellow snap beans (cooked), 63 g. Other Foods and Drinks   Bagel, 1 bagel / 98 g.  Chicken breast (roasted and chopped),  c / 70 g.  Chocolate ice cream / 66 g.  Pita bread, 1 large / 64 g.  Shrimp (steamed), 4 oz / 113 g.  Swiss cheese (diced), 70 g.  Vanilla ice cream, 66 g.  Vanilla pudding, 140 g. FOODS LOW IN POTASSIUM Less than 150 mg per serving. A serving size is  cup (120 mL or noted gram weight) unless otherwise stated. If you eat more than 1 serving of a food low in potassium, the food may be considered a food high in potassium. Fruits  Apple (slices), 55 g.  Apple juice.  Applesauce, 122 g.  Blackberries, 72 g.  Blueberries, 74 g.  Cranberries, 50 g.  Cranberry juice.  Fruit cocktail, 119 g.  Fruit punch.  Grapes, 46 g.  Grape juice.  Mandarin oranges (canned), 126 g.  Peach (slices), 77 g.  Pineapple (chunks), 83 g.  Raspberries, 62 g.  Red cherries (without pits), 78 g.  Strawberries (sliced), 83 g.  Watermelon (diced), 76 g. Vegetables  Alfalfa sprouts, 17 g.  Bell peppers (sliced), 46 g.  Cabbage (shredded), 35 g.  Cauliflower (boiled), 62 g.  Celery, 51 g.  Collard greens (boiled), 95 g.  Cucumber (sliced), 52 g.  Eggplant (cubed), 41 g.  Green beans (boiled), 63 g.  Lettuce (shredded), 1 c / 36 g.  Onions (sauteed), 44 g.  Radishes (sliced), 58 g.  Spaghetti squash, 51 g. Other Foods and Drinks  [employer] food cake, 1 slice / 28 g.  Black tea.  Brown rice (cooked), 98 g.  Butter croissant, 1 medium / 57 g.  Carbonated soda.  Coffee.  Cheddar cheese (diced), 66 g.  Corn flake cereal (dry), 14 g.  Cottage cheese, 118 g.  Cream of rice cereal (cooked), 122 g.  Cream of wheat cereal (cooked), 126 g.  Crisped rice cereal (dry), 14 g.  Egg (boiled, fried, poached,  omelet, scrambled), 1  large / 46 61 g.  English muffin, 1 muffin / 57 g.  Frozen ice pop, 1 pop / 55 g.  Graham cracker, 1 large rectangular cracker / 14 g.  Jelly beans, 112 g.  Non-dairy whipped topping.  Oatmeal, 88 g.  Orange sherbet, 74 g.  Puffed rice cereal (dry), 7 g.  Pasta (cooked), 70 g.  Rice cakes, 4 cakes / 36 g.  Sugared doughnut, 4 oz / 116 g.  White bread, 1 slice / 30 g.  White rice (cooked), 79 93 g.  Wild rice (cooked), 82 g.  Yellow cake, 1 slice / 68 g. Document Released: 01/26/2005 Document Revised: 05/31/2012 Document Reviewed: 10/29/2011 [facility] Patient Information 2014 [place], [place].

## 2013-06-30 NOTE — MAU Note (Signed)
Pt very sleepy. Have to tap her to wake her to answer questions. Answers the question and then falls asleep

## 2013-06-30 NOTE — MAU Note (Signed)
Pt waiting for husband to come pick her up

## 2013-06-30 NOTE — Progress Notes (Signed)
Philipp Deputy CNM on unit

## 2013-07-09 ENCOUNTER — Ambulatory Visit (INDEPENDENT_AMBULATORY_CARE_PROVIDER_SITE_OTHER): Payer: Medicare Other | Admitting: Family Medicine

## 2013-07-09 ENCOUNTER — Encounter: Payer: Self-pay | Admitting: Family Medicine

## 2013-07-09 VITALS — BP 97/68 | Temp 98.0°F | Wt 141.4 lb

## 2013-07-09 DIAGNOSIS — O0990 Supervision of high risk pregnancy, unspecified, unspecified trimester: Secondary | ICD-10-CM

## 2013-07-09 DIAGNOSIS — O99281 Endocrine, nutritional and metabolic diseases complicating pregnancy, first trimester: Secondary | ICD-10-CM

## 2013-07-09 DIAGNOSIS — I509 Heart failure, unspecified: Secondary | ICD-10-CM

## 2013-07-09 DIAGNOSIS — E079 Disorder of thyroid, unspecified: Secondary | ICD-10-CM

## 2013-07-09 DIAGNOSIS — A64 Unspecified sexually transmitted disease: Secondary | ICD-10-CM | POA: Diagnosis not present

## 2013-07-09 DIAGNOSIS — O09523 Supervision of elderly multigravida, third trimester: Secondary | ICD-10-CM

## 2013-07-09 DIAGNOSIS — O360990 Maternal care for other rhesus isoimmunization, unspecified trimester, not applicable or unspecified: Secondary | ICD-10-CM | POA: Diagnosis not present

## 2013-07-09 DIAGNOSIS — E039 Hypothyroidism, unspecified: Secondary | ICD-10-CM | POA: Diagnosis not present

## 2013-07-09 DIAGNOSIS — O09529 Supervision of elderly multigravida, unspecified trimester: Secondary | ICD-10-CM

## 2013-07-09 DIAGNOSIS — F319 Bipolar disorder, unspecified: Secondary | ICD-10-CM

## 2013-07-09 DIAGNOSIS — E0590 Thyrotoxicosis, unspecified without thyrotoxic crisis or storm: Secondary | ICD-10-CM

## 2013-07-09 DIAGNOSIS — O360133 Maternal care for anti-D [Rh] antibodies, third trimester, fetus 3: Secondary | ICD-10-CM

## 2013-07-09 DIAGNOSIS — O0993 Supervision of high risk pregnancy, unspecified, third trimester: Secondary | ICD-10-CM

## 2013-07-09 DIAGNOSIS — O99280 Endocrine, nutritional and metabolic diseases complicating pregnancy, unspecified trimester: Secondary | ICD-10-CM

## 2013-07-09 DIAGNOSIS — O360131 Maternal care for anti-D [Rh] antibodies, third trimester, fetus 1: Secondary | ICD-10-CM

## 2013-07-09 LAB — POCT URINALYSIS DIP (DEVICE)
BILIRUBIN URINE: NEGATIVE
Glucose, UA: NEGATIVE mg/dL
HGB URINE DIPSTICK: NEGATIVE
KETONES UR: NEGATIVE mg/dL
Leukocytes, UA: NEGATIVE
Nitrite: NEGATIVE
PH: 5 (ref 5.0–8.0)
PROTEIN: NEGATIVE mg/dL
Specific Gravity, Urine: <=1.005 (ref 1.005–1.030)
Urobilinogen, UA: 0.2 mg/dL (ref 0.0–1.0)

## 2013-07-09 LAB — CBC
HEMATOCRIT: 32.9 % — AB (ref 36.0–46.0)
HEMOGLOBIN: 10.8 g/dL — AB (ref 12.0–15.0)
MCH: 28.1 pg (ref 26.0–34.0)
MCHC: 32.8 g/dL (ref 30.0–36.0)
MCV: 85.5 fL (ref 78.0–100.0)
Platelets: 305 10*3/uL (ref 150–400)
RBC: 3.85 MIL/uL — ABNORMAL LOW (ref 3.87–5.11)
RDW: 14.9 % (ref 11.5–15.5)
WBC: 15 10*3/uL — ABNORMAL HIGH (ref 4.0–10.5)

## 2013-07-09 MED ORDER — HALOPERIDOL 5 MG PO TABS: 7.5000 mg | ORAL_TABLET | Freq: Three times a day (TID) | ORAL | Status: DC

## 2013-07-09 MED ORDER — PROPYLTHIOURACIL 50 MG PO TABS: 50.0000 mg | ORAL_TABLET | Freq: Every day | ORAL | Status: DC

## 2013-07-09 MED ORDER — RHO D IMMUNE GLOBULIN 1500 UNIT/2ML IJ SOLN: 300.0000 ug | Freq: Once | INTRAMUSCULAR | Status: DC

## 2013-07-09 MED ORDER — ZOLPIDEM TARTRATE 5 MG PO TABS: 5.0000 mg | ORAL_TABLET | Freq: Every evening | ORAL | Status: DC | PRN

## 2013-07-09 MED ORDER — RHO D IMMUNE GLOBULIN 1500 UNIT/2ML IJ SOLN
300.0000 ug | Freq: Once | INTRAMUSCULAR | Status: AC
  Administered 2013-07-09: 300 ug via INTRAMUSCULAR

## 2013-07-09 NOTE — Progress Notes (Signed)
+  FM, no lof, no vb, no ctx Bipolar/psychosis: [facility] from psych [facility] and started on haldol. Pt has not been taking. Reviewed medications from [facility] and will restart at 7.5mg  TID as previously Rx'd Hx of CHF: evaluated by cardiology no further investigation Hyperthyroid: restart PTU 50mg  qday, repeat labs today and in 3wks to ensure improved Rh neg: rhogam today HTN: BP at goal not on meds. Pt reports metoprolol use but no RX for it and not indicated Glucola: needs to be collected Housing: reports lack of housing, sent to social work.  Pt manner of speech and interaction feels manic but is similar to her prior episodes. Concerning pt is not taking medication and may becoming manic. Restarting meds now.  Christine Cobb is a 38 y.o. G3P1011 at [redacted]w[redacted]d here for ROB visit.  Discussed with Patient:  -Plans to breast/bottle feed.  All questions answered. -Continue prenatal vitamins. -Reviewed fetal kick counts (Pt to perform daily at a time when the baby is active, lie laterally with both hands on belly in quiet room and count all movements (hiccups, shoulder rolls, obvious kicks, etc); pt is to report to clinic or MAU for less than 10 movements felt in a one hour time period-pt told as soon as she counts 10 movements the count is complete.)  - Routine precautions discussed (depression, infection s/s).   Patient provided with all pertinent phone numbers for emergencies. - RTC for any VB, regular, painful cramps/ctxs occurring at a rate of >2/10 min, fever (100.5 or higher), n/v/d, any pain that is unresolving or worsening, LOF, decreased fetal movement, CP, SOB, edema  Problems: Patient Active Problem List   Diagnosis Date Noted  . Psychotic disorder 05/17/2013  . Chronic hypertension complicating pregnancy, antepartum 04/19/2013  . Marijuana abuse 04/12/2013  . Maternal Hyperthyroidism,  antepartum 03/10/2013  . Bipolar 1 disorder 02/01/2013  . Supervision of high-risk  pregnancy 02/01/2013  . Elderly multigravida with antepartum condition or complication 02/01/2013  . History of CHF (congestive heart failure) 02/01/2013  . Abscess of abdominal wall 10/18/2012  . HTN (hypertension) 04/08/2011  . Tachycardia 04/08/2011    To Do: 1. Rhogam given (if Rh-) 2. glucola

## 2013-07-09 NOTE — Addendum Note (Signed)
Addended by: Franchot Mimes on: 07/09/2013 02:32 PM   Modules accepted: Orders

## 2013-07-09 NOTE — Patient Instructions (Signed)
Third Trimester of Pregnancy  The third trimester is from week 29 through week 42, months 7 through 9. The third trimester is a time when the fetus is growing rapidly. At the end of the ninth month, the fetus is about 20 inches in length and weighs 6 10 pounds.   BODY CHANGES  Your body goes through many changes during pregnancy. The changes vary from woman to woman.    Your weight will continue to increase. You can expect to gain 25 35 pounds (11 16 kg) by the end of the pregnancy.   You may begin to get stretch marks on your hips, abdomen, and breasts.   You may urinate more often because the fetus is moving lower into your pelvis and pressing on your bladder.   You may develop or continue to have heartburn as a result of your pregnancy.   You may develop constipation because certain hormones are causing the muscles that push waste through your intestines to slow down.   You may develop hemorrhoids or swollen, bulging veins (varicose veins).   You may have pelvic pain because of the weight gain and pregnancy hormones relaxing your joints between the bones in your pelvis. Back aches may result from over exertion of the muscles supporting your posture.   Your breasts will continue to grow and be tender. A yellow discharge may leak from your breasts called colostrum.   Your belly button may stick out.   You may feel short of breath because of your expanding uterus.   You may notice the fetus "dropping," or moving lower in your abdomen.   You may have a bloody mucus discharge. This usually occurs a few days to a week before labor begins.   Your cervix becomes thin and soft (effaced) near your due date.  WHAT TO EXPECT AT YOUR PRENATAL EXAMS   You will have prenatal exams every 2 weeks until week 36. Then, you will have weekly prenatal exams. During a routine prenatal visit:   You will be weighed to make sure you and the fetus are growing normally.   Your blood pressure is taken.   Your abdomen will be  measured to track your baby's growth.   The fetal heartbeat will be listened to.   Any test results from the previous visit will be discussed.   You may have a cervical check near your due date to see if you have effaced.  At around 36 weeks, your caregiver will check your cervix. At the same time, your caregiver will also perform a test on the secretions of the vaginal tissue. This test is to determine if a type of bacteria, Group B streptococcus, is present. Your caregiver will explain this further.  Your caregiver may ask you:   What your birth plan is.   How you are feeling.   If you are feeling the baby move.   If you have had any abnormal symptoms, such as leaking fluid, bleeding, severe headaches, or abdominal cramping.   If you have any questions.  Other tests or screenings that may be performed during your third trimester include:   Blood tests that check for low iron levels (anemia).   Fetal testing to check the health, activity level, and growth of the fetus. Testing is done if you have certain medical conditions or if there are problems during the pregnancy.  FALSE LABOR  You may feel small, irregular contractions that eventually go away. These are called Braxton Hicks contractions, or   false labor. Contractions may last for hours, days, or even weeks before true labor sets in. If contractions come at regular intervals, intensify, or become painful, it is best to be seen by your caregiver.   SIGNS OF LABOR    Menstrual-like cramps.   Contractions that are 5 minutes apart or less.   Contractions that start on the top of the uterus and spread down to the lower abdomen and back.   A sense of increased pelvic pressure or back pain.   A watery or bloody mucus discharge that comes from the vagina.  If you have any of these signs before the 37th week of pregnancy, call your caregiver right away. You need to go to the hospital to get checked immediately.  HOME CARE INSTRUCTIONS    Avoid all  smoking, herbs, alcohol, and unprescribed drugs. These chemicals affect the formation and growth of the baby.   Follow your caregiver's instructions regarding medicine use. There are medicines that are either safe or unsafe to take during pregnancy.   Exercise only as directed by your caregiver. Experiencing uterine cramps is a good sign to stop exercising.   Continue to eat regular, healthy meals.   Wear a good support bra for breast tenderness.   Do not use hot tubs, steam rooms, or saunas.   Wear your seat belt at all times when driving.   Avoid raw meat, uncooked cheese, cat litter boxes, and soil used by cats. These carry germs that can cause birth defects in the baby.   Take your prenatal vitamins.   Try taking a stool softener (if your caregiver approves) if you develop constipation. Eat more high-fiber foods, such as fresh vegetables or fruit and whole grains. Drink plenty of fluids to keep your urine clear or pale yellow.   Take warm sitz baths to soothe any pain or discomfort caused by hemorrhoids. Use hemorrhoid cream if your caregiver approves.   If you develop varicose veins, wear support hose. Elevate your feet for 15 minutes, 3 4 times a day. Limit salt in your diet.   Avoid heavy lifting, wear low heal shoes, and practice good posture.   Rest a lot with your legs elevated if you have leg cramps or low back pain.   Visit your dentist if you have not gone during your pregnancy. Use a soft toothbrush to brush your teeth and be gentle when you floss.   A sexual relationship may be continued unless your caregiver directs you otherwise.   Do not travel far distances unless it is absolutely necessary and only with the approval of your caregiver.   Take prenatal classes to understand, practice, and ask questions about the labor and delivery.   Make a trial run to the hospital.   Pack your hospital bag.   Prepare the baby's nursery.   Continue to go to all your prenatal visits as directed  by your caregiver.  SEEK MEDICAL CARE IF:   You are unsure if you are in labor or if your water has broken.   You have dizziness.   You have mild pelvic cramps, pelvic pressure, or nagging pain in your abdominal area.   You have persistent nausea, vomiting, or diarrhea.   You have a bad smelling vaginal discharge.   You have pain with urination.  SEEK IMMEDIATE MEDICAL CARE IF:    You have a fever.   You are leaking fluid from your vagina.   You have spotting or bleeding from your vagina.     You have severe abdominal cramping or pain.   You have rapid weight loss or gain.   You have shortness of breath with chest pain.   You notice sudden or extreme swelling of your face, hands, ankles, feet, or legs.   You have not felt your baby move in over an hour.   You have severe headaches that do not go away with medicine.   You have vision changes.  Document Released: 06/08/2001 Document Revised: 02/14/2013 Document Reviewed: 08/15/2012  [employer] Patient Information 2014 [employer].

## 2013-07-09 NOTE — Progress Notes (Signed)
P=135   Pt states she needs to see Child psychotherapist for housing and [facility].  States visit [facility] for hypokalemia. Pt took Potassium medication.  States she now smokes [employer] instead [employer] and has not smoked pot in past 2 weeks. Pt states she had a watery discharge for the past month.

## 2013-07-09 NOTE — Addendum Note (Signed)
Addended by: Franchot Mimes on: 07/09/2013 02:41 PM   Modules accepted: Orders

## 2013-07-10 LAB — GLUCOSE TOLERANCE, 1 HOUR (50G) W/O FASTING: GLUCOSE 1 HOUR GTT: 76 mg/dL (ref 70–140)

## 2013-07-10 LAB — ANTIBODY SCREEN: ANTIBODY SCREEN: NEGATIVE

## 2013-07-10 LAB — RPR

## 2013-07-10 LAB — HIV ANTIBODY (ROUTINE TESTING W REFLEX): HIV: NONREACTIVE

## 2013-07-10 LAB — T3: T3, Total: 169 ng/dL (ref 80.0–204.0)

## 2013-07-10 LAB — T4, FREE: FREE T4: 1.24 ng/dL (ref 0.80–1.80)

## 2013-07-10 LAB — TSH: TSH: 0.016 u[IU]/mL — AB (ref 0.350–4.500)

## 2013-07-11 ENCOUNTER — Encounter: Payer: Self-pay | Admitting: Family Medicine

## 2013-07-11 DIAGNOSIS — E0590 Thyrotoxicosis, unspecified without thyrotoxic crisis or storm: Secondary | ICD-10-CM | POA: Insufficient documentation

## 2013-07-11 DIAGNOSIS — O99280 Endocrine, nutritional and metabolic diseases complicating pregnancy, unspecified trimester: Secondary | ICD-10-CM

## 2013-07-13 ENCOUNTER — Ambulatory Visit (HOSPITAL_COMMUNITY): Payer: Medicare Other | Attending: Family Medicine

## 2013-07-16 ENCOUNTER — Encounter: Payer: Self-pay | Admitting: Obstetrics & Gynecology

## 2013-07-31 ENCOUNTER — Encounter (HOSPITAL_COMMUNITY): Payer: Self-pay | Admitting: *Deleted

## 2013-07-31 ENCOUNTER — Telehealth: Payer: Self-pay | Admitting: *Deleted

## 2013-07-31 ENCOUNTER — Inpatient Hospital Stay (HOSPITAL_COMMUNITY)
Admission: AD | Admit: 2013-07-31 | Discharge: 2013-07-31 | Disposition: A | Discharge status: Home / Self Care | Payer: Medicare Other | Source: Ambulatory Visit | Attending: Obstetrics & Gynecology | Admitting: Obstetrics & Gynecology

## 2013-07-31 DIAGNOSIS — F319 Bipolar disorder, unspecified: Secondary | ICD-10-CM | POA: Diagnosis not present

## 2013-07-31 DIAGNOSIS — O99340 Other mental disorders complicating pregnancy, unspecified trimester: Secondary | ICD-10-CM | POA: Insufficient documentation

## 2013-07-31 DIAGNOSIS — O479 False labor, unspecified: Secondary | ICD-10-CM | POA: Diagnosis not present

## 2013-07-31 DIAGNOSIS — M545 Low back pain, unspecified: Secondary | ICD-10-CM | POA: Insufficient documentation

## 2013-07-31 DIAGNOSIS — O4700 False labor before 37 completed weeks of gestation, unspecified trimester: Secondary | ICD-10-CM | POA: Insufficient documentation

## 2013-07-31 DIAGNOSIS — O9989 Other specified diseases and conditions complicating pregnancy, childbirth and the puerperium: Secondary | ICD-10-CM

## 2013-07-31 DIAGNOSIS — O99891 Other specified diseases and conditions complicating pregnancy: Secondary | ICD-10-CM | POA: Insufficient documentation

## 2013-07-31 DIAGNOSIS — F1210 Cannabis abuse, uncomplicated: Secondary | ICD-10-CM | POA: Insufficient documentation

## 2013-07-31 LAB — URINALYSIS, ROUTINE W REFLEX MICROSCOPIC
BILIRUBIN URINE: NEGATIVE
Glucose, UA: NEGATIVE mg/dL
HGB URINE DIPSTICK: NEGATIVE
KETONES UR: NEGATIVE mg/dL
Leukocytes, UA: NEGATIVE
Nitrite: NEGATIVE
PH: 6 (ref 5.0–8.0)
Protein, ur: NEGATIVE mg/dL
Urobilinogen, UA: 0.2 mg/dL (ref 0.0–1.0)

## 2013-07-31 LAB — RAPID URINE DRUG SCREEN, HOSP PERFORMED
Amphetamines: NOT DETECTED
BARBITURATES: NOT DETECTED
Benzodiazepines: NOT DETECTED
COCAINE: NOT DETECTED
Opiates: NOT DETECTED
Tetrahydrocannabinol: NOT DETECTED

## 2013-07-31 LAB — AMNISURE RUPTURE OF MEMBRANE (ROM) NOT AT ARMC: Amnisure ROM: NEGATIVE

## 2013-07-31 MED ORDER — CYCLOBENZAPRINE HCL 10 MG PO TABS: 10.0000 mg | ORAL_TABLET | Freq: Three times a day (TID) | ORAL | Status: DC | PRN

## 2013-07-31 MED ORDER — ACETAMINOPHEN 500 MG PO TABS
500.0000 mg | ORAL_TABLET | Freq: Four times a day (QID) | ORAL | Status: DC | PRN
Start: 2013-07-31 — End: 2013-09-02

## 2013-07-31 MED ORDER — ZOLPIDEM TARTRATE 5 MG PO TABS: 5.0000 mg | ORAL_TABLET | Freq: Every evening | ORAL | Status: DC | PRN

## 2013-07-31 NOTE — Discharge Instructions (Signed)

## 2013-07-31 NOTE — Telephone Encounter (Signed)
Pt called the nurse line requesting advice for pain in back.

## 2013-07-31 NOTE — Telephone Encounter (Signed)
Spoke with patient, patient is currently in MAU.  Pt will call and schedule an appointment for regular OB visit.

## 2013-07-31 NOTE — MAU Note (Addendum)
Pt urinated in trash can prior to RN coming in room.  Pt states she can't make it to the bathroom and demands a bedpan.  Pt ambulated to bathroom to urinate again with RN assistance.  Pt has very rapid speech and is alternating with crying and laughing.  Pt has very flighty thoughts and request one thing after another.  Multiple blankets, ice chips, socks, tylenol, pain meds, bus passes, etc.  Pt states the last time she used marijuana was yesterday when she smoked a blunt.  Pt denies any other drugs on board at this time.  UDS sent.  1015 RN in room with another warm blanket and ice chips.  Pt standing up at side of bed and states she couldn't make it to the bathroom again.  Pt urinated in a cup and poured it down the sink.  Wash cloth given to pt to clean herself up.  H. Mathews Robinsons, CNM made aware.  1020 Bedside commode brought to bedside.

## 2013-07-31 NOTE — MAU Provider Note (Signed)
Obstetric Attending MAU Note  Chief Complaint:  Increased urination   First Provider Initiated Contact with Patient 07/31/13 1117     HPI: Christine Cobb is a 38 y.o. G3P1011 at [redacted]w[redacted]d who presents to maternity admissions with multiple complaints.    MAU Notes by Dalia Heading. Paschal, RN [phone] Pt presenting to MAU with multiple complaints. Pt seems irritated, sleepy, and rambling with speech. Pt reports she is "unable to hold her urine" x 3 weeks and has been experiencing lower back pain x 3 weeks. Pt states she has vomited x 4 this morning. Pt denies any vaginal bleeding. Good fetal movement. Pt states she is staying at the [facility] and is homeless. 1018 Pt urinated in trash can prior to RN coming in room. Pt states she can't make it to the bathroom and demands a bedpan. Pt ambulated to bathroom to urinate again with RN assistance. Pt has very rapid speech and is alternating with crying and laughing. Pt has very flighty thoughts and request one thing after another. Multiple blankets, ice chips, socks, tylenol, pain meds, bus passes, etc. Pt states the last time she used marijuana was yesterday when she smoked a blunt. Pt denies any other drugs on board at this time. UDS sent.  1015 RN in room with another warm blanket and ice chips. Pt standing up at side of bed and states she couldn't make it to the bathroom again. Pt urinated in a cup and poured it down the sink. Wash cloth given to pt to clean herself up. H. Mathews Robinsons, CNM made aware.  1020 Bedside commode brought to bedside.   On encounter with me, she denies contractions, leakage of fluid or vaginal bleeding. Reports low back pain.  Good fetal movement.   Pregnancy Course: Receives care at [facility]  Patient Active Problem List   Diagnosis Date Noted  . Chronic hypertension complicating pregnancy, antepartum 04/19/2013    Priority: High  . Maternal Hyperthyroidism,  antepartum 03/10/2013    Priority: High  . Elderly multigravida with antepartum  condition or complication 02/01/2013    Priority: High  . Hyperthyroidism complicating pregnancy 07/11/2013  . Psychotic disorder 05/17/2013  . Marijuana abuse 04/12/2013  . Bipolar 1 disorder 02/01/2013  . Supervision of high-risk pregnancy 02/01/2013  . History of CHF (congestive heart failure) 02/01/2013  . Abscess of abdominal wall 10/18/2012  . HTN (hypertension) 04/08/2011  . Tachycardia 04/08/2011    Past Medical History  Diagnosis Date  . Sinus tachycardia   . HTN (hypertension)   . Arm pain   . Eye globe prosthesis   . CHF (congestive heart failure)   . Bipolar affective disorder, currently manic, mild   . Hyperthyroidism     OB History  Gravida Para Term Preterm AB SAB TAB Ectopic Multiple Living  3 1 1  1 1  0   1    # Outcome Date GA Lbr Len/2nd Weight Sex Delivery Anes PTL Lv  3 CUR           2 TRM 01/17/98    M SVD None  Y  1 SAB  [redacted]w[redacted]d            Comments: D & C      Past Surgical History  Procedure Laterality Date  . Eye surgery    . Dilation and curettage of uterus      Family History  Problem Relation Age of Onset  . Hypertension Other   . Emphysema Other   . Asthma  Son   . Diabetes Maternal Uncle   . Diabetes Paternal Grandmother      History  Substance Use Topics  . Smoking status: Current Every Day Smoker -- 0.25 packs/day    Types: Cigarettes  . Smokeless tobacco: Never Used  . Alcohol Use: 0.0 oz/week     Allergies  Allergen Reactions  . Amoxil [Amoxicillin] Anaphylaxis  . Trazodone And Nefazodone Anaphylaxis  . Ketorolac Tromethamine Hives and Swelling  . Prenatal [B Complex Vitamins Plus] Nausea Only  . Tegretol [Carbamazepine] Other (See Comments)    Swelling, skin peeling, skin discoloration    Prescriptions prior to admission  Medication Sig Dispense Refill  . acetaminophen (TYLENOL) 500 MG tablet Take 500 mg by mouth every 6 (six) hours as needed for mild pain.      . haloperidol (HALDOL) 5 MG tablet Take 1.5  tablets (7.5 mg total) by mouth 3 (three) times daily.  90 tablet  1  . propylthiouracil (PTU) 50 MG tablet Take 1 tablet (50 mg total) by mouth daily.  60 tablet  1  . potassium chloride (K-DUR) 10 MEQ tablet Take 2 tablets (20 mEq total) by mouth daily.  8 tablet  0  . rho,d, immune globulin (RHIG/RHOPHYLAC) 1500 UNIT/2ML SOLN injection Inject 2 mLs (300 mcg total) into the vein once.  2 mL  0    ROS: Pertinent findings in history of present illness.  Physical Exam  Blood pressure 118/84, pulse 133, temperature 97.7 F (36.5 C), temperature source Oral, resp. rate 20, height 5\' 4"  (1.626 m), last menstrual period 11/28/2012, SpO2 100.00%. GENERAL: Well-developed, well-nourished female in no acute distress.  ABDOMEN: Soft, non-tender, gravid appropriate for gestational age EXTREMITIES: Nontender, no edema NEURO: Alert and oriented SPECULUM EXAM: NEFG, physiologic discharge, no blood, cervix clean Dilation: 1 Cervical Position:  (long) Exam by:: Dr. Lovett SoxAnywanwu  FHT:  Baseline 130 , moderate variability, accelerations present, no decelerations Contractions: None   Labs: Results for orders placed during the hospital encounter of 07/31/13 (from the past 24 hour(s))  URINALYSIS, ROUTINE W REFLEX MICROSCOPIC     Status: Abnormal   Collection Time    07/31/13  9:10 AM      Result Value Range   Color, Urine YELLOW  YELLOW   APPearance CLEAR  CLEAR   Specific Gravity, Urine <1.005 (*) 1.005 - 1.030   pH 6.0  5.0 - 8.0   Glucose, UA NEGATIVE  NEGATIVE mg/dL   Hgb urine dipstick NEGATIVE  NEGATIVE   Bilirubin Urine NEGATIVE  NEGATIVE   Ketones, ur NEGATIVE  NEGATIVE mg/dL   Protein, ur NEGATIVE  NEGATIVE mg/dL   Urobilinogen, UA 0.2  0.0 - 1.0 mg/dL   Nitrite NEGATIVE  NEGATIVE   Leukocytes, UA NEGATIVE  NEGATIVE  AMNISURE RUPTURE OF MEMBRANE (ROM)     Status: None   Collection Time    07/31/13  9:50 AM      Result Value Range   Amnisure ROM NEGATIVE      MAU Course: See RN notes  above  Assessment: 1. False labor, antepartum     Plan: Discharge home Scripts for Ambien and Flexeril given to patient Fetal movement and labor precautions reviewed. Follow up in clinic scheduled  Follow-up Information   [facility]. (10:15am appointment. Please come to MAU for any obstetric concerns.)    Contact information:   [address] [place] [address] [phone]        Medication List  STOP taking these medications       rho(d) immune globulin 1500 UNIT/2ML Soln injection  Commonly known as:  RHIG/Rhophylac      TAKE these medications       acetaminophen 500 MG tablet  Commonly known as:  TYLENOL  Take 500 mg by mouth every 6 (six) hours as needed for mild pain.     acetaminophen 500 MG tablet  Commonly known as:  TYLENOL  Take 1-2 tablets (500-1,000 mg total) by mouth every 6 (six) hours as needed.     cyclobenzaprine 10 MG tablet  Commonly known as:  FLEXERIL  Take 1 tablet (10 mg total) by mouth 3 (three) times daily as needed for muscle spasms.     haloperidol 5 MG tablet  Commonly known as:  HALDOL  Take 1.5 tablets (7.5 mg total) by mouth 3 (three) times daily.     potassium chloride 10 MEQ tablet  Commonly known as:  K-DUR  Take 2 tablets (20 mEq total) by mouth daily.     propylthiouracil 50 MG tablet  Commonly known as:  PTU  Take 1 tablet (50 mg total) by mouth daily.     zolpidem 5 MG tablet  Commonly known as:  AMBIEN  Take 1 tablet (5 mg total) by mouth at bedtime as needed for sleep.        Tereso Newcomer, MD 07/31/2013 11:30 AM

## 2013-07-31 NOTE — MAU Note (Signed)
Pt presenting to MAU with multiple complaints.  Pt seems irritated, sleepy, and rambling with speech.  Pt reports she is "unable to hold her urine" x 3 weeks and has been experiencing lower back pain x 3 weeks.  Pt states she has vomited x 4 this morning.  Pt denies any vaginal bleeding.  Good fetal movement.  Pt states she is staying at the [facility] and is homeless.

## 2013-08-16 ENCOUNTER — Encounter: Payer: Self-pay | Admitting: Family Medicine

## 2013-08-24 ENCOUNTER — Inpatient Hospital Stay (HOSPITAL_COMMUNITY)
Admission: AD | Admit: 2013-08-24 | Discharge: 2013-08-24 | Disposition: A | Discharge status: Home / Self Care | Payer: Medicare Other | Source: Ambulatory Visit | Attending: Obstetrics & Gynecology | Admitting: Obstetrics & Gynecology

## 2013-08-24 ENCOUNTER — Encounter (HOSPITAL_COMMUNITY): Payer: Self-pay | Admitting: *Deleted

## 2013-08-24 DIAGNOSIS — I509 Heart failure, unspecified: Secondary | ICD-10-CM | POA: Diagnosis not present

## 2013-08-24 DIAGNOSIS — E0590 Thyrotoxicosis, unspecified without thyrotoxic crisis or storm: Secondary | ICD-10-CM

## 2013-08-24 DIAGNOSIS — O10019 Pre-existing essential hypertension complicating pregnancy, unspecified trimester: Secondary | ICD-10-CM

## 2013-08-24 DIAGNOSIS — O479 False labor, unspecified: Secondary | ICD-10-CM

## 2013-08-24 DIAGNOSIS — F319 Bipolar disorder, unspecified: Secondary | ICD-10-CM | POA: Insufficient documentation

## 2013-08-24 DIAGNOSIS — O09529 Supervision of elderly multigravida, unspecified trimester: Secondary | ICD-10-CM

## 2013-08-24 DIAGNOSIS — Z87891 Personal history of nicotine dependence: Secondary | ICD-10-CM | POA: Diagnosis not present

## 2013-08-24 DIAGNOSIS — O99280 Endocrine, nutritional and metabolic diseases complicating pregnancy, unspecified trimester: Secondary | ICD-10-CM

## 2013-08-24 DIAGNOSIS — O10919 Unspecified pre-existing hypertension complicating pregnancy, unspecified trimester: Secondary | ICD-10-CM

## 2013-08-24 DIAGNOSIS — O0990 Supervision of high risk pregnancy, unspecified, unspecified trimester: Secondary | ICD-10-CM

## 2013-08-24 MED ORDER — DIPHENHYDRAMINE HCL 25 MG PO CAPS
25.0000 mg | ORAL_CAPSULE | Freq: Once | ORAL | Status: AC
  Administered 2013-08-24: 25 mg via ORAL
  Filled 2013-08-24: qty 1

## 2013-08-24 MED ORDER — ACETAMINOPHEN 500 MG PO TABS
500.0000 mg | ORAL_TABLET | Freq: Once | ORAL | Status: AC
  Administered 2013-08-24: 500 mg via ORAL
  Filled 2013-08-24: qty 1

## 2013-08-24 NOTE — MAU Note (Signed)
Patient states she has been having frequent contractions. Denies bleeding or leaking. Reports good fetal movement.

## 2013-08-24 NOTE — MAU Provider Note (Signed)
Chief Complaint:  Labor Eval   Christine Cobb is a 38 y.o.  G3P1011 with IUP at 42109w3d presenting for Labor Eval  States has been having contractions on and off for several days. Believes she is 3cm. No lof, vb. +FM.     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 Had questions about her medication because she needs refills on her meds of PTU, ambien and flexeril.  States she needs these medications because she isn't sleeping so has been taking nyquil.    Last visit in clinic was on 07/09/13 and has not been seen since. States transportation can be challenging. Has missed multiple appointments and had been admitted to psych on several occurences.       Menstrual History: OB History   Grav Para Term Preterm Abortions TAB SAB Ect Mult Living   3 1 1  1  0 1   1     Patient's last menstrual period was 11/28/2012.      Past Medical History  Diagnosis Date  . Sinus tachycardia   . HTN (hypertension)   . Arm pain   . Eye globe prosthesis   . CHF (congestive heart failure)   . Bipolar affective disorder, currently manic, mild   . Hyperthyroidism     Past Surgical History  Procedure Laterality Date  . Eye surgery    . Dilation and curettage of uterus      Family History  Problem Relation Age of Onset  . Hypertension Other   . Emphysema Other   . Asthma Son   . Diabetes Maternal Uncle   . Diabetes Paternal Grandmother     History  Substance Use Topics  . Smoking status: Former Smoker -- 0.25 packs/day    Types: Cigarettes    Quit date: 08/21/2013  . Smokeless tobacco: Never Used  . Alcohol Use: No      Allergies  Allergen Reactions  . Amoxil [Amoxicillin] Anaphylaxis  . Trazodone And Nefazodone Anaphylaxis  . Ketorolac Tromethamine Hives and Swelling  . Prenatal [B Complex Vitamins Plus] Nausea Only  . Tegretol [Carbamazepine] Other (See Comments)    Swelling, skin peeling, skin discoloration    Prescriptions prior to admission  Medication Sig Dispense Refill  .  acetaminophen (TYLENOL) 500 MG tablet Take 1-2 tablets (500-1,000 mg total) by mouth every 6 (six) hours as needed.  30 tablet  5  . cyclobenzaprine (FLEXERIL) 10 MG tablet Take 1 tablet (10 mg total) by mouth 3 (three) times daily as needed for muscle spasms.  30 tablet  2  . propylthiouracil (PTU) 50 MG tablet Take 1 tablet (50 mg total) by mouth daily.  60 tablet  1  . zolpidem (AMBIEN) 5 MG tablet Take 1 tablet (5 mg total) by mouth at bedtime as needed for sleep.  30 tablet  1    Review of Systems - Negative except for what is mentioned in HPI.  Physical Exam  Blood pressure 126/96, pulse 110, temperature 98.1 F (36.7 C), temperature source Oral, resp. rate 20, height 5\' 5"  (1.651 m), weight 70.58 kg (155 lb 9.6 oz), last menstrual period 11/28/2012, SpO2 100.00%. GENERAL: Well-developed, well-nourished female in no acute distress.  LUNGS: Clear to auscultation bilaterally.  HEART: Regular rate and rhythm. ABDOMEN: Soft, nontender, nondistended, gravid.  EXTREMITIES: Nontender, no edema, 2+ distal pulses. Cervical Exam: Dilatation 3cm   Effacement 40%   Station -3   Presentation: cephalic FHT:  Baseline rate 140 bpm   Variability moderate  Accelerations present  Decelerations none Contractions: irregular.    Labs: No results found for this or any previous visit (from the past 24 hour(s)).  Imaging Studies:  No results found.  Assessment: Christine Cobb is  38 y.o. G3P1011 at [redacted]w[redacted]d presents with Labor Eval .  Plan:  1) r/o labor - cervix unchanged over >1hour - labor precautions discussed - reassurance given.   2) med refills - reviewed chart. Discussed with pt that she MUST be seen in clinic as she has been missing so many appointments - reviewed and pt has a refill of PTU at the pharmacy as was given #60 on 1/12 with 1 refill - discussed will not refill ambien from the emergency room as this needs to be discussed with her provider in clinic - stop nyquil -  recommend benadryl if she needs it for sleep (max 25mg  nightly)   Pt states she will definitely keep her appointment this week in clinic as she needs to get her ambien refilled.    3) FWB- cat I tracing.   4) d/c home   Codee Bloodworth L 2/27/20156:52 PM

## 2013-08-24 NOTE — Discharge Instructions (Signed)
Third Trimester of Pregnancy  The third trimester is from week 29 through week 42, months 7 through 9. The third trimester is a time when the fetus is growing rapidly. At the end of the ninth month, the fetus is about 20 inches in length and weighs 6 10 pounds.   BODY CHANGES  Your body goes through many changes during pregnancy. The changes vary from woman to woman.    Your weight will continue to increase. You can expect to gain 25 35 pounds (11 16 kg) by the end of the pregnancy.   You may begin to get stretch marks on your hips, abdomen, and breasts.   You may urinate more often because the fetus is moving lower into your pelvis and pressing on your bladder.   You may develop or continue to have heartburn as a result of your pregnancy.   You may develop constipation because certain hormones are causing the muscles that push waste through your intestines to slow down.   You may develop hemorrhoids or swollen, bulging veins (varicose veins).   You may have pelvic pain because of the weight gain and pregnancy hormones relaxing your joints between the bones in your pelvis. Back aches may result from over exertion of the muscles supporting your posture.   Your breasts will continue to grow and be tender. A yellow discharge may leak from your breasts called colostrum.   Your belly button may stick out.   You may feel short of breath because of your expanding uterus.   You may notice the fetus "dropping," or moving lower in your abdomen.   You may have a bloody mucus discharge. This usually occurs a few days to a week before labor begins.   Your cervix becomes thin and soft (effaced) near your due date.  WHAT TO EXPECT AT YOUR PRENATAL EXAMS   You will have prenatal exams every 2 weeks until week 36. Then, you will have weekly prenatal exams. During a routine prenatal visit:   You will be weighed to make sure you and the fetus are growing normally.   Your blood pressure is taken.   Your abdomen will be  measured to track your baby's growth.   The fetal heartbeat will be listened to.   Any test results from the previous visit will be discussed.   You may have a cervical check near your due date to see if you have effaced.  At around 36 weeks, your caregiver will check your cervix. At the same time, your caregiver will also perform a test on the secretions of the vaginal tissue. This test is to determine if a type of bacteria, Group B streptococcus, is present. Your caregiver will explain this further.  Your caregiver may ask you:   What your birth plan is.   How you are feeling.   If you are feeling the baby move.   If you have had any abnormal symptoms, such as leaking fluid, bleeding, severe headaches, or abdominal cramping.   If you have any questions.  Other tests or screenings that may be performed during your third trimester include:   Blood tests that check for low iron levels (anemia).   Fetal testing to check the health, activity level, and growth of the fetus. Testing is done if you have certain medical conditions or if there are problems during the pregnancy.  FALSE LABOR  You may feel small, irregular contractions that eventually go away. These are called Braxton Hicks contractions, or   false labor. Contractions may last for hours, days, or even weeks before true labor sets in. If contractions come at regular intervals, intensify, or become painful, it is best to be seen by your caregiver.   SIGNS OF LABOR    Menstrual-like cramps.   Contractions that are 5 minutes apart or less.   Contractions that start on the top of the uterus and spread down to the lower abdomen and back.   A sense of increased pelvic pressure or back pain.   A watery or bloody mucus discharge that comes from the vagina.  If you have any of these signs before the 37th week of pregnancy, call your caregiver right away. You need to go to the hospital to get checked immediately.  HOME CARE INSTRUCTIONS    Avoid all  smoking, herbs, alcohol, and unprescribed drugs. These chemicals affect the formation and growth of the baby.   Follow your caregiver's instructions regarding medicine use. There are medicines that are either safe or unsafe to take during pregnancy.   Exercise only as directed by your caregiver. Experiencing uterine cramps is a good sign to stop exercising.   Continue to eat regular, healthy meals.   Wear a good support bra for breast tenderness.   Do not use hot tubs, steam rooms, or saunas.   Wear your seat belt at all times when driving.   Avoid raw meat, uncooked cheese, cat litter boxes, and soil used by cats. These carry germs that can cause birth defects in the baby.   Take your prenatal vitamins.   Try taking a stool softener (if your caregiver approves) if you develop constipation. Eat more high-fiber foods, such as fresh vegetables or fruit and whole grains. Drink plenty of fluids to keep your urine clear or pale yellow.   Take warm sitz baths to soothe any pain or discomfort caused by hemorrhoids. Use hemorrhoid cream if your caregiver approves.   If you develop varicose veins, wear support hose. Elevate your feet for 15 minutes, 3 4 times a day. Limit salt in your diet.   Avoid heavy lifting, wear low heal shoes, and practice good posture.   Rest a lot with your legs elevated if you have leg cramps or low back pain.   Visit your dentist if you have not gone during your pregnancy. Use a soft toothbrush to brush your teeth and be gentle when you floss.   A sexual relationship may be continued unless your caregiver directs you otherwise.   Do not travel far distances unless it is absolutely necessary and only with the approval of your caregiver.   Take prenatal classes to understand, practice, and ask questions about the labor and delivery.   Make a trial run to the hospital.   Pack your hospital bag.   Prepare the baby's nursery.   Continue to go to all your prenatal visits as directed  by your caregiver.  SEEK MEDICAL CARE IF:   You are unsure if you are in labor or if your water has broken.   You have dizziness.   You have mild pelvic cramps, pelvic pressure, or nagging pain in your abdominal area.   You have persistent nausea, vomiting, or diarrhea.   You have a bad smelling vaginal discharge.   You have pain with urination.  SEEK IMMEDIATE MEDICAL CARE IF:    You have a fever.   You are leaking fluid from your vagina.   You have spotting or bleeding from your vagina.     You have severe abdominal cramping or pain.   You have rapid weight loss or gain.   You have shortness of breath with chest pain.   You notice sudden or extreme swelling of your face, hands, ankles, feet, or legs.   You have not felt your baby move in over an hour.   You have severe headaches that do not go away with medicine.   You have vision changes.  Document Released: 06/08/2001 Document Revised: 02/14/2013 Document Reviewed: 08/15/2012  Exit[employer] Patient Information 2014 [employer].

## 2013-08-30 ENCOUNTER — Ambulatory Visit (INDEPENDENT_AMBULATORY_CARE_PROVIDER_SITE_OTHER): Payer: Medicare Other | Admitting: Obstetrics and Gynecology

## 2013-08-30 ENCOUNTER — Encounter (HOSPITAL_COMMUNITY): Payer: Self-pay | Admitting: *Deleted

## 2013-08-30 ENCOUNTER — Inpatient Hospital Stay (EMERGENCY_DEPARTMENT_HOSPITAL)
Admission: AD | Admit: 2013-08-30 | Discharge: 2013-08-30 | Disposition: A | Discharge status: Home / Self Care | Payer: Medicare Other | Source: Ambulatory Visit | Attending: Obstetrics and Gynecology | Admitting: Obstetrics and Gynecology

## 2013-08-30 VITALS — BP 136/95 | Temp 97.5°F | Wt 155.3 lb

## 2013-08-30 DIAGNOSIS — O479 False labor, unspecified: Secondary | ICD-10-CM | POA: Insufficient documentation

## 2013-08-30 DIAGNOSIS — O10019 Pre-existing essential hypertension complicating pregnancy, unspecified trimester: Secondary | ICD-10-CM

## 2013-08-30 DIAGNOSIS — E0590 Thyrotoxicosis, unspecified without thyrotoxic crisis or storm: Secondary | ICD-10-CM

## 2013-08-30 DIAGNOSIS — R443 Hallucinations, unspecified: Secondary | ICD-10-CM | POA: Diagnosis not present

## 2013-08-30 DIAGNOSIS — O10919 Unspecified pre-existing hypertension complicating pregnancy, unspecified trimester: Secondary | ICD-10-CM

## 2013-08-30 DIAGNOSIS — O99280 Endocrine, nutritional and metabolic diseases complicating pregnancy, unspecified trimester: Secondary | ICD-10-CM

## 2013-08-30 DIAGNOSIS — O0990 Supervision of high risk pregnancy, unspecified, unspecified trimester: Secondary | ICD-10-CM

## 2013-08-30 DIAGNOSIS — O09529 Supervision of elderly multigravida, unspecified trimester: Secondary | ICD-10-CM

## 2013-08-30 DIAGNOSIS — O9989 Other specified diseases and conditions complicating pregnancy, childbirth and the puerperium: Secondary | ICD-10-CM

## 2013-08-30 DIAGNOSIS — O471 False labor at or after 37 completed weeks of gestation: Secondary | ICD-10-CM

## 2013-08-30 DIAGNOSIS — F319 Bipolar disorder, unspecified: Secondary | ICD-10-CM

## 2013-08-30 DIAGNOSIS — O99891 Other specified diseases and conditions complicating pregnancy: Secondary | ICD-10-CM | POA: Insufficient documentation

## 2013-08-30 LAB — POCT URINALYSIS DIP (DEVICE)
BILIRUBIN URINE: NEGATIVE
GLUCOSE, UA: NEGATIVE mg/dL
Hgb urine dipstick: NEGATIVE
KETONES UR: NEGATIVE mg/dL
LEUKOCYTES UA: NEGATIVE
Nitrite: NEGATIVE
Protein, ur: NEGATIVE mg/dL
Specific Gravity, Urine: <=1.005 (ref 1.005–1.030)
Urobilinogen, UA: 0.2 mg/dL (ref 0.0–1.0)
pH: 6 (ref 5.0–8.0)

## 2013-08-30 MED ORDER — ACETAMINOPHEN 325 MG PO TABS
975.0000 mg | ORAL_TABLET | Freq: Four times a day (QID) | ORAL | Status: DC | PRN
  Administered 2013-08-30: 975 mg via ORAL
  Filled 2013-08-30: qty 3

## 2013-08-30 MED ORDER — DIPHENHYDRAMINE HCL 25 MG PO CAPS
25.0000 mg | ORAL_CAPSULE | Freq: Four times a day (QID) | ORAL | Status: DC | PRN
  Administered 2013-08-30: 25 mg via ORAL
  Filled 2013-08-30: qty 1

## 2013-08-30 MED ORDER — ZOLPIDEM TARTRATE 5 MG PO TABS: 5.0000 mg | ORAL_TABLET | Freq: Every evening | ORAL | Status: DC | PRN

## 2013-08-30 NOTE — MAU Note (Signed)
Pt arrived via EMS for possible labor.  Pt concerned because she had a black stool today and is worried her baby is suffocating from blood in her intestines.

## 2013-08-30 NOTE — Progress Notes (Signed)
P=118  Visit to MAU for dark black stool

## 2013-08-30 NOTE — Discharge Instructions (Signed)

## 2013-08-30 NOTE — MAU Provider Note (Signed)
Chief Complaint:  Labor eval  Christine Cobb is a 38 y.o.  G3P1011 with IUP at 1523w2d presenting for labor evaluation, and is concerned because she had a black stool today and is worried her baby is suffocating from blood in her intestines. She continues to smoke 4 cigs a day. Pt has black stool and has been evaluate for this condition in the past with negative heme occult. No BRBPR. No diarrhea. No other symptoms.  She denies LOF, VB or contractions, and notes good FM.   Menstrual History: OB History   Grav Para Term Preterm Abortions TAB SAB Ect Mult Living   3 1 1  1  0 1   1     Patient's last menstrual period was 11/28/2012.     Past Medical History  Diagnosis Date  . Sinus tachycardia   . HTN (hypertension)   . Arm pain   . Eye globe prosthesis   . CHF (congestive heart failure)   . Bipolar affective disorder, currently manic, mild   . Hyperthyroidism     Past Surgical History  Procedure Laterality Date  . Eye surgery    . Dilation and curettage of uterus      Family History  Problem Relation Age of Onset  . Hypertension Other   . Emphysema Other   . Asthma Son   . Diabetes Maternal Uncle   . Diabetes Paternal Grandmother     History  Substance Use Topics  . Smoking status: Former Smoker -- 0.25 packs/day    Types: Cigarettes    Quit date: 08/21/2013  . Smokeless tobacco: Never Used  . Alcohol Use: No      Allergies  Allergen Reactions  . Amoxil [Amoxicillin] Anaphylaxis  . Trazodone And Nefazodone Anaphylaxis  . Ketorolac Tromethamine Hives and Swelling  . Prenatal [B Complex Vitamins Plus] Nausea Only  . Tegretol [Carbamazepine] Other (See Comments)    Swelling, skin peeling, skin discoloration    Prescriptions prior to admission  Medication Sig Dispense Refill  . acetaminophen (TYLENOL) 500 MG tablet Take 1-2 tablets (500-1,000 mg total) by mouth every 6 (six) hours as needed.  30 tablet  5  . cyclobenzaprine (FLEXERIL) 10 MG tablet Take 1  tablet (10 mg total) by mouth 3 (three) times daily as needed for muscle spasms.  30 tablet  2  . haloperidol (HALDOL) 5 MG tablet Take 5 mg by mouth 2 (two) times daily.      Marland Kitchen. propylthiouracil (PTU) 50 MG tablet Take 1 tablet (50 mg total) by mouth daily.  60 tablet  1  . traMADol (ULTRAM) 50 MG tablet Take by mouth every 6 (six) hours as needed.      . zolpidem (AMBIEN) 5 MG tablet Take 1 tablet (5 mg total) by mouth at bedtime as needed for sleep.  30 tablet  1    Review of Systems - Negative except for what is mentioned in HPI.  Physical Exam  Blood pressure 119/87, pulse 128, temperature 98.6 F (37 C), temperature source Oral, resp. rate 24, last menstrual period 11/28/2012, SpO2 99.00%. GENERAL: Well-developed, well-nourished female in no acute distress.  ABDOMEN: Soft, nontender, nondistended, gravid.  FHT:  Baseline rate 140 bpm   Variability moderate  Accelerations present Decelerations none Contractions: none  Dilation: 3 Effacement (%): 50 Cervical Position: Anterior Station: -2 Presentation: Vertex Exam by:: Dr. Ike Benedom   Labs: No results found for this or any previous visit (from the past 24 hour(s)).  Imaging Studies:  Limited  OB US: AFI 9.07; FHR 143  Assessment: Christine Cobb is  38 y.o. G3P1011 at [redacted]w[redacted]d presents for labor evaluation. FHT Cat 1, and Limited OB US reassuring with AFI 9.07  Plan: False Labor - follow-up in MOC as scheduled  [name] 3/5/20153:40 AM  Pt without active bleeding. Reassuring fetus. Pt discharged with recommendations to follow up in clinic. Pt requested APAP and benadryl. Given in MAU. Pt had refill on ambien as well.   I spoke with and examined patient and agree with resident's note and plan of care.  Tawana Scale, MD OB Fellow 08/30/2013 5:46 AM

## 2013-08-30 NOTE — MAU Note (Signed)
Dr. Ike Bene at the bedside.

## 2013-08-30 NOTE — Progress Notes (Signed)
Patient not seen today by provider. Patient very upset at the fat that she has been here since 8 am and not seen earlier than her scheduled 10 am appointment. Patient seen in MAU and discharged at 3 am with normal NST. Patient is without complaints. F/U in 1 week

## 2013-09-01 ENCOUNTER — Inpatient Hospital Stay (HOSPITAL_COMMUNITY)
Admission: AD | Admit: 2013-09-01 | Discharge: 2013-09-04 | DRG: 774 | Disposition: A | Discharge status: Home / Self Care | Payer: Medicare Other | Source: Ambulatory Visit | Attending: Obstetrics & Gynecology | Admitting: Obstetrics & Gynecology

## 2013-09-01 ENCOUNTER — Encounter (HOSPITAL_COMMUNITY): Payer: Self-pay | Admitting: *Deleted

## 2013-09-01 DIAGNOSIS — I2510 Atherosclerotic heart disease of native coronary artery without angina pectoris: Secondary | ICD-10-CM | POA: Diagnosis present

## 2013-09-01 DIAGNOSIS — O09529 Supervision of elderly multigravida, unspecified trimester: Secondary | ICD-10-CM | POA: Diagnosis present

## 2013-09-01 DIAGNOSIS — O4290 Premature rupture of membranes, unspecified as to length of time between rupture and onset of labor, unspecified weeks of gestation: Secondary | ICD-10-CM | POA: Diagnosis present

## 2013-09-01 DIAGNOSIS — O99344 Other mental disorders complicating childbirth: Secondary | ICD-10-CM | POA: Diagnosis present

## 2013-09-01 DIAGNOSIS — F319 Bipolar disorder, unspecified: Secondary | ICD-10-CM | POA: Diagnosis present

## 2013-09-01 DIAGNOSIS — O9942 Diseases of the circulatory system complicating childbirth: Secondary | ICD-10-CM

## 2013-09-01 DIAGNOSIS — I509 Heart failure, unspecified: Secondary | ICD-10-CM | POA: Diagnosis not present

## 2013-09-01 DIAGNOSIS — O1002 Pre-existing essential hypertension complicating childbirth: Principal | ICD-10-CM | POA: Diagnosis present

## 2013-09-01 DIAGNOSIS — Z87891 Personal history of nicotine dependence: Secondary | ICD-10-CM

## 2013-09-01 DIAGNOSIS — O10919 Unspecified pre-existing hypertension complicating pregnancy, unspecified trimester: Secondary | ICD-10-CM

## 2013-09-01 DIAGNOSIS — O99280 Endocrine, nutritional and metabolic diseases complicating pregnancy, unspecified trimester: Secondary | ICD-10-CM

## 2013-09-01 DIAGNOSIS — O0990 Supervision of high risk pregnancy, unspecified, unspecified trimester: Secondary | ICD-10-CM

## 2013-09-01 DIAGNOSIS — E0590 Thyrotoxicosis, unspecified without thyrotoxic crisis or storm: Secondary | ICD-10-CM

## 2013-09-01 NOTE — MAU Note (Addendum)
Pt reports she fell Thursday on her back . Has not felt baby move since the fall. Reports she started bleeding  as well. C/o back pain and abd pain that comes and goes every 6 min now.

## 2013-09-02 ENCOUNTER — Encounter (HOSPITAL_COMMUNITY): Payer: Self-pay | Admitting: Family Medicine

## 2013-09-02 ENCOUNTER — Encounter (HOSPITAL_COMMUNITY): Payer: Medicare Other | Admitting: Anesthesiology

## 2013-09-02 ENCOUNTER — Inpatient Hospital Stay (HOSPITAL_COMMUNITY): Payer: Medicare Other | Admitting: Anesthesiology

## 2013-09-02 DIAGNOSIS — O09529 Supervision of elderly multigravida, unspecified trimester: Secondary | ICD-10-CM | POA: Diagnosis present

## 2013-09-02 DIAGNOSIS — O99344 Other mental disorders complicating childbirth: Secondary | ICD-10-CM | POA: Diagnosis not present

## 2013-09-02 DIAGNOSIS — O9942 Diseases of the circulatory system complicating childbirth: Secondary | ICD-10-CM | POA: Diagnosis present

## 2013-09-02 DIAGNOSIS — O99891 Other specified diseases and conditions complicating pregnancy: Secondary | ICD-10-CM | POA: Diagnosis not present

## 2013-09-02 DIAGNOSIS — O4290 Premature rupture of membranes, unspecified as to length of time between rupture and onset of labor, unspecified weeks of gestation: Secondary | ICD-10-CM | POA: Diagnosis present

## 2013-09-02 DIAGNOSIS — F319 Bipolar disorder, unspecified: Secondary | ICD-10-CM | POA: Diagnosis present

## 2013-09-02 DIAGNOSIS — I509 Heart failure, unspecified: Secondary | ICD-10-CM | POA: Diagnosis not present

## 2013-09-02 DIAGNOSIS — Z87891 Personal history of nicotine dependence: Secondary | ICD-10-CM | POA: Diagnosis not present

## 2013-09-02 DIAGNOSIS — I2510 Atherosclerotic heart disease of native coronary artery without angina pectoris: Secondary | ICD-10-CM | POA: Diagnosis present

## 2013-09-02 DIAGNOSIS — O1002 Pre-existing essential hypertension complicating childbirth: Secondary | ICD-10-CM | POA: Diagnosis not present

## 2013-09-02 LAB — CBC
HCT: 31.5 % — ABNORMAL LOW (ref 36.0–46.0)
HCT: 31.6 % — ABNORMAL LOW (ref 36.0–46.0)
HEMOGLOBIN: 10.6 g/dL — AB (ref 12.0–15.0)
HEMOGLOBIN: 10.7 g/dL — AB (ref 12.0–15.0)
MCH: 27.7 pg (ref 26.0–34.0)
MCH: 28.2 pg (ref 26.0–34.0)
MCHC: 33.5 g/dL (ref 30.0–36.0)
MCHC: 34 g/dL (ref 30.0–36.0)
MCV: 82.7 fL (ref 78.0–100.0)
MCV: 82.9 fL (ref 78.0–100.0)
PLATELETS: 187 10*3/uL (ref 150–400)
Platelets: 199 10*3/uL (ref 150–400)
RBC: 3.8 MIL/uL — ABNORMAL LOW (ref 3.87–5.11)
RBC: 3.82 MIL/uL — ABNORMAL LOW (ref 3.87–5.11)
RDW: 16.3 % — ABNORMAL HIGH (ref 11.5–15.5)
RDW: 16.6 % — ABNORMAL HIGH (ref 11.5–15.5)
WBC: 25.5 10*3/uL — ABNORMAL HIGH (ref 4.0–10.5)
WBC: 26.9 10*3/uL — ABNORMAL HIGH (ref 4.0–10.5)

## 2013-09-02 LAB — COMPREHENSIVE METABOLIC PANEL
ALT: 7 U/L (ref 0–35)
AST: 14 U/L (ref 0–37)
Albumin: 2.1 g/dL — ABNORMAL LOW (ref 3.5–5.2)
Alkaline Phosphatase: 214 U/L — ABNORMAL HIGH (ref 39–117)
BUN: 7 mg/dL (ref 6–23)
CALCIUM: 8.8 mg/dL (ref 8.4–10.5)
CO2: 21 mEq/L (ref 19–32)
Chloride: 96 mEq/L (ref 96–112)
Creatinine, Ser: 0.91 mg/dL (ref 0.50–1.10)
GFR, EST NON AFRICAN AMERICAN: 81 mL/min — AB (ref >90)
GLUCOSE: 126 mg/dL — AB (ref 70–99)
Potassium: 3.8 mEq/L (ref 3.7–5.3)
SODIUM: 131 meq/L — AB (ref 137–147)
Total Bilirubin: 0.4 mg/dL (ref 0.3–1.2)
Total Protein: 6.3 g/dL (ref 6.0–8.3)

## 2013-09-02 LAB — RPR: RPR: NONREACTIVE

## 2013-09-02 LAB — OB RESULTS CONSOLE GBS: STREP GROUP B AG: POSITIVE

## 2013-09-02 LAB — GROUP B STREP BY PCR: Group B strep by PCR: POSITIVE — AB

## 2013-09-02 MED ORDER — PHENYLEPHRINE 40 MCG/ML (10ML) SYRINGE FOR IV PUSH (FOR BLOOD PRESSURE SUPPORT): 80.0000 ug | PREFILLED_SYRINGE | INTRAVENOUS | Status: DC | PRN

## 2013-09-02 MED ORDER — ACETAMINOPHEN 500 MG PO TABS: 500.0000 mg | ORAL_TABLET | Freq: Four times a day (QID) | ORAL | Status: DC | PRN

## 2013-09-02 MED ORDER — PROPYLTHIOURACIL 50 MG PO TABS
50.0000 mg | ORAL_TABLET | Freq: Every day | ORAL | Status: DC
  Administered 2013-09-02 – 2013-09-03 (×2): 50 mg via ORAL
  Filled 2013-09-02 (×2): qty 1

## 2013-09-02 MED ORDER — DIPHENHYDRAMINE HCL 25 MG PO CAPS: 25.0000 mg | ORAL_CAPSULE | Freq: Four times a day (QID) | ORAL | Status: DC | PRN

## 2013-09-02 MED ORDER — LIDOCAINE HCL (PF) 1 % IJ SOLN
INTRAMUSCULAR | Status: DC | PRN
  Administered 2013-09-02 (×2): 4 mL

## 2013-09-02 MED ORDER — ONDANSETRON HCL 4 MG PO TABS: 4.0000 mg | ORAL_TABLET | ORAL | Status: DC | PRN

## 2013-09-02 MED ORDER — EPHEDRINE 5 MG/ML INJ
10.0000 mg | INTRAVENOUS | Status: DC | PRN
  Filled 2013-09-02: qty 4

## 2013-09-02 MED ORDER — LANOLIN HYDROUS EX OINT: TOPICAL_OINTMENT | CUTANEOUS | Status: DC | PRN

## 2013-09-02 MED ORDER — OXYCODONE-ACETAMINOPHEN 5-325 MG PO TABS: 1.0000 | ORAL_TABLET | ORAL | Status: DC | PRN

## 2013-09-02 MED ORDER — ZOLPIDEM TARTRATE 5 MG PO TABS: 5.0000 mg | ORAL_TABLET | Freq: Every evening | ORAL | Status: DC | PRN

## 2013-09-02 MED ORDER — BENZOCAINE-MENTHOL 20-0.5 % EX AERO
1.0000 "application " | INHALATION_SPRAY | CUTANEOUS | Status: DC | PRN
  Administered 2013-09-02: 1 via TOPICAL
  Filled 2013-09-02 (×2): qty 56

## 2013-09-02 MED ORDER — OXYTOCIN 40 UNITS IN LACTATED RINGERS INFUSION - SIMPLE MED
INTRAVENOUS | Status: AC
  Filled 2013-09-02: qty 1000

## 2013-09-02 MED ORDER — CLINDAMYCIN PHOSPHATE 900 MG/50ML IV SOLN
900.0000 mg | Freq: Three times a day (TID) | INTRAVENOUS | Status: DC
  Administered 2013-09-02: 900 mg via INTRAVENOUS
  Filled 2013-09-02 (×2): qty 50

## 2013-09-02 MED ORDER — PRENATAL MULTIVITAMIN CH
1.0000 | ORAL_TABLET | Freq: Every day | ORAL | Status: DC
  Filled 2013-09-02: qty 1

## 2013-09-02 MED ORDER — SIMETHICONE 80 MG PO CHEW: 80.0000 mg | CHEWABLE_TABLET | ORAL | Status: DC | PRN

## 2013-09-02 MED ORDER — EPHEDRINE 5 MG/ML INJ
10.0000 mg | INTRAVENOUS | Status: DC | PRN
  Administered 2013-09-02: 10 mg via INTRAVENOUS

## 2013-09-02 MED ORDER — LIDOCAINE HCL (PF) 1 % IJ SOLN
30.0000 mL | INTRAMUSCULAR | Status: DC | PRN
  Filled 2013-09-02: qty 30

## 2013-09-02 MED ORDER — ACETAMINOPHEN 325 MG PO TABS: 650.0000 mg | ORAL_TABLET | ORAL | Status: DC | PRN

## 2013-09-02 MED ORDER — FENTANYL 2.5 MCG/ML BUPIVACAINE 1/10 % EPIDURAL INFUSION (WH - ANES)
14.0000 mL/h | INTRAMUSCULAR | Status: DC | PRN
  Filled 2013-09-02: qty 125

## 2013-09-02 MED ORDER — OXYCODONE-ACETAMINOPHEN 5-325 MG PO TABS
1.0000 | ORAL_TABLET | ORAL | Status: DC | PRN
  Administered 2013-09-02 – 2013-09-03 (×4): 1 via ORAL
  Administered 2013-09-03 – 2013-09-04 (×4): 2 via ORAL
  Filled 2013-09-02 (×3): qty 2
  Filled 2013-09-02 (×3): qty 1
  Filled 2013-09-02: qty 2
  Filled 2013-09-02: qty 1

## 2013-09-02 MED ORDER — CYCLOBENZAPRINE HCL 10 MG PO TABS
10.0000 mg | ORAL_TABLET | Freq: Three times a day (TID) | ORAL | Status: DC | PRN
  Administered 2013-09-02: 10 mg via ORAL
  Filled 2013-09-02 (×2): qty 1

## 2013-09-02 MED ORDER — OXYTOCIN BOLUS FROM INFUSION
500.0000 mL | INTRAVENOUS | Status: DC
  Administered 2013-09-02: 500 mL via INTRAVENOUS

## 2013-09-02 MED ORDER — LACTATED RINGERS IV SOLN
INTRAVENOUS | Status: DC
  Administered 2013-09-02: 04:00:00 via INTRAVENOUS

## 2013-09-02 MED ORDER — TRAMADOL HCL 50 MG PO TABS: 50.0000 mg | ORAL_TABLET | Freq: Four times a day (QID) | ORAL | Status: DC | PRN

## 2013-09-02 MED ORDER — IBUPROFEN 600 MG PO TABS
600.0000 mg | ORAL_TABLET | Freq: Four times a day (QID) | ORAL | Status: DC
  Administered 2013-09-02 – 2013-09-04 (×7): 600 mg via ORAL
  Filled 2013-09-02 (×7): qty 1

## 2013-09-02 MED ORDER — LACTATED RINGERS IV SOLN
500.0000 mL | INTRAVENOUS | Status: DC | PRN
  Administered 2013-09-02: 1000 mL via INTRAVENOUS

## 2013-09-02 MED ORDER — PROPYLTHIOURACIL 50 MG PO TABS
50.0000 mg | ORAL_TABLET | Freq: Every day | ORAL | Status: DC
  Filled 2013-09-02 (×2): qty 1

## 2013-09-02 MED ORDER — FENTANYL 2.5 MCG/ML BUPIVACAINE 1/10 % EPIDURAL INFUSION (WH - ANES): 14.0000 mL/h | INTRAMUSCULAR | Status: DC | PRN

## 2013-09-02 MED ORDER — CITRIC ACID-SODIUM CITRATE 334-500 MG/5ML PO SOLN: 30.0000 mL | ORAL | Status: DC | PRN

## 2013-09-02 MED ORDER — MISOPROSTOL 200 MCG PO TABS
ORAL_TABLET | ORAL | Status: AC
  Administered 2013-09-02: 800 ug via RECTAL
  Filled 2013-09-02: qty 4

## 2013-09-02 MED ORDER — ONDANSETRON HCL 4 MG/2ML IJ SOLN: 4.0000 mg | Freq: Four times a day (QID) | INTRAMUSCULAR | Status: DC | PRN

## 2013-09-02 MED ORDER — TETANUS-DIPHTH-ACELL PERTUSSIS 5-2.5-18.5 LF-MCG/0.5 IM SUSP: 0.5000 mL | Freq: Once | INTRAMUSCULAR | Status: DC

## 2013-09-02 MED ORDER — HALOPERIDOL 5 MG PO TABS
5.0000 mg | ORAL_TABLET | Freq: Two times a day (BID) | ORAL | Status: DC
  Administered 2013-09-02 – 2013-09-03 (×4): 5 mg via ORAL
  Filled 2013-09-02 (×5): qty 1

## 2013-09-02 MED ORDER — OXYTOCIN 40 UNITS IN LACTATED RINGERS INFUSION - SIMPLE MED
62.5000 mL/h | INTRAVENOUS | Status: DC
  Administered 2013-09-02: 62.5 mL/h via INTRAVENOUS

## 2013-09-02 MED ORDER — WITCH HAZEL-GLYCERIN EX PADS: 1.0000 "application " | MEDICATED_PAD | CUTANEOUS | Status: DC | PRN

## 2013-09-02 MED ORDER — LACTATED RINGERS IV SOLN
500.0000 mL | Freq: Once | INTRAVENOUS | Status: AC
  Administered 2013-09-02: 500 mL via INTRAVENOUS

## 2013-09-02 MED ORDER — SENNOSIDES-DOCUSATE SODIUM 8.6-50 MG PO TABS
2.0000 | ORAL_TABLET | ORAL | Status: DC
  Administered 2013-09-03 (×2): 2 via ORAL
  Filled 2013-09-02 (×2): qty 2

## 2013-09-02 MED ORDER — ONDANSETRON HCL 4 MG/2ML IJ SOLN: 4.0000 mg | INTRAMUSCULAR | Status: DC | PRN

## 2013-09-02 MED ORDER — FENTANYL 2.5 MCG/ML BUPIVACAINE 1/10 % EPIDURAL INFUSION (WH - ANES)
INTRAMUSCULAR | Status: DC | PRN
  Administered 2013-09-02: 14 mL/h via EPIDURAL

## 2013-09-02 MED ORDER — DIPHENHYDRAMINE HCL 50 MG/ML IJ SOLN: 12.5000 mg | INTRAMUSCULAR | Status: DC | PRN

## 2013-09-02 MED ORDER — LIDOCAINE HCL (PF) 1 % IJ SOLN
INTRAMUSCULAR | Status: AC
  Filled 2013-09-02: qty 30

## 2013-09-02 MED ORDER — PHENYLEPHRINE 40 MCG/ML (10ML) SYRINGE FOR IV PUSH (FOR BLOOD PRESSURE SUPPORT)
80.0000 ug | PREFILLED_SYRINGE | INTRAVENOUS | Status: DC | PRN
  Filled 2013-09-02: qty 10

## 2013-09-02 MED ORDER — DIBUCAINE 1 % RE OINT
1.0000 "application " | TOPICAL_OINTMENT | RECTAL | Status: DC | PRN
  Filled 2013-09-02: qty 28

## 2013-09-02 NOTE — Anesthesia Preprocedure Evaluation (Addendum)
Anesthesia Evaluation  Patient identified by MRN, date of birth, ID band Patient awake    Reviewed: Allergy & Precautions, H&P , NPO status , Patient's Chart, lab work & pertinent test results  Airway Mallampati: II TM Distance: >3 FB     Dental  (+) Dental Advisory Given   Pulmonary neg pulmonary ROS, former smoker,          Cardiovascular hypertension, +CHF Rhythm:Regular Rate:Normal     Neuro/Psych PSYCHIATRIC DISORDERS Bipolar Disorder negative neurological ROS     GI/Hepatic negative GI ROS, Neg liver ROS,   Endo/Other  Hyperthyroidism   Renal/GU negative Renal ROS     Musculoskeletal negative musculoskeletal ROS (+)   Abdominal   Peds  Hematology negative hematology ROS (+)   Anesthesia Other Findings   Reproductive/Obstetrics (+) Pregnancy                          Anesthesia Physical Anesthesia Plan  ASA: II  Anesthesia Plan: Epidural   Post-op Pain Management:    Induction:   Airway Management Planned:   Additional Equipment:   Intra-op Plan:   Post-operative Plan:   Informed Consent: I have reviewed the patients History and Physical, chart, labs and discussed the procedure including the risks, benefits and alternatives for the proposed anesthesia with the patient or authorized representative who has indicated his/her understanding and acceptance.     Plan Discussed with:   Anesthesia Plan Comments:         Anesthesia Quick Evaluation

## 2013-09-02 NOTE — Anesthesia Postprocedure Evaluation (Signed)
  Anesthesia Post-op Note  Patient: Christine Cobb  Procedure(s) Performed: * No procedures listed *  Patient Location: Mother/Baby  Anesthesia Type:Epidural  Level of Consciousness: awake  Airway and Oxygen Therapy: Patient Spontanous Breathing  Post-op Pain: mild  Post-op Assessment: Patient's Cardiovascular Status Stable and Respiratory Function Stable  Post-op Vital Signs: stable  Complications: No apparent anesthesia complications

## 2013-09-02 NOTE — H&P (Signed)
Christine Cobb is a 38 y.o. female presenting for leaking fluid and blood since 08/31/13.  States was seen for a labor check this week "and they told me not to come back until I was in more pain, so I stayed home".  Maternal Medical History:  Reason for admission: Nausea.    OB History   Grav Para Term Preterm Abortions TAB SAB Ect Mult Living   3 1 1  1  0 1   1     Past Medical History  Diagnosis Date  . Sinus tachycardia   . HTN (hypertension)   . Arm pain   . Eye globe prosthesis   . CHF (congestive heart failure)   . Bipolar affective disorder, currently manic, mild   . Hyperthyroidism    Past Surgical History  Procedure Laterality Date  . Eye surgery    . Dilation and curettage of uterus     Family History: family history includes Asthma in her son; Diabetes in her maternal uncle and paternal grandmother; Emphysema in her other; Hypertension in her other. Social History:  reports that she quit smoking 12 days ago. Her smoking use included Cigarettes. She smoked 0.25 packs per day. She has never used smokeless tobacco. She reports that she uses illicit drugs (Marijuana). She reports that she does not drink alcohol.  Review of Systems  Constitutional: Negative for fever, chills and malaise/fatigue.  Gastrointestinal: Positive for abdominal pain. Negative for nausea, vomiting, diarrhea and constipation.  Genitourinary: Negative for dysuria.       Bloody fluid, soaking adult diapers   Neurological: Negative for headaches.    Dilation: 7 Effacement (%): 100 Station: -1 Exam by:: M.Jarnell Cordaro,CNM Blood pressure 138/97, pulse 83, temperature 97.6 F (36.4 C), temperature source Oral, resp. rate 20, last menstrual period 11/28/2012, SpO2 100.00%. Maternal Exam:  Uterine Assessment: Contraction strength is firm.  Contraction frequency is irregular.   Abdomen: Fundal height is 37.   Estimated fetal weight is 7.5.   Fetal presentation: vertex  Introitus: Normal vulva.  Vagina is positive for vaginal discharge (blood tinged clear fluid, copious).  Ferning test: positive.  Nitrazine test: not done. Amniotic fluid character: bloody and clear.  Pelvis: adequate for delivery.   Cervix: Cervix evaluated by digital exam.     Fetal Exam Fetal Monitor Review: Mode: ultrasound.   Baseline rate: 135.  Variability: moderate (6-25 bpm).   Pattern: no decelerations and accelerations present.    Fetal State Assessment: Category I - tracings are normal.     Physical Exam  Constitutional: She is oriented to person, place, and time. She appears well-developed and well-nourished. No distress.  HENT:  Head: Normocephalic.  Cardiovascular: Normal rate.   Respiratory: Effort normal.  GI: Soft. She exhibits no distension. There is no tenderness. There is no rebound and no guarding.  Genitourinary: Uterus normal. Vaginal discharge (blood tinged clear fluid, copious) found.  Cervix 7-8cm/90/-2/vtrx   Musculoskeletal: Normal range of motion.  Neurological: She is alert and oriented to person, place, and time.  Skin: Skin is warm and dry.  Psychiatric: She has a normal mood and affect.    Prenatal labs: ABO, Rh: O/NEG/-- (08/07 1031) Antibody: NEG (01/12 1444) Rubella: 3.23 (08/07 1031) RPR: NON REAC (01/12 1444)  HBsAg: NEGATIVE (08/07 1031)  HIV: NON REACTIVE (01/12 1444)  GBS:   Was never done on last few visits.  Will do PCR today.   Assessment/Plan: A:  SIUP at 9980w5d       PROM at term,  prolonged      Active labor, transition      Reassuring fetal heart rate status  P:  Admit to [facility]       Routine orders       Wants epidural, ordered        Anticipate SVD  [facility] 09/02/2013, 12:26 AM

## 2013-09-02 NOTE — Anesthesia Procedure Notes (Signed)
Epidural Patient location during procedure: OB Start time: 09/02/2013 1:30 AM End time: 09/02/2013 1:40 AM  Staffing Anesthesiologist: Lewie Loron R Performed by: anesthesiologist   Preanesthetic Checklist Completed: patient identified, pre-op evaluation, timeout performed, IV checked, risks and benefits discussed and monitors and equipment checked  Epidural Patient position: sitting Prep: site prepped and draped and DuraPrep Patient monitoring: heart rate Approach: midline Location: L2-L3 Injection technique: LOR air and LOR saline  Needle:  Needle type: Tuohy  Needle gauge: 17 G Needle length: 9 cm Needle insertion depth: 4 cm Catheter type: closed end flexible Catheter size: 19 Gauge Catheter at skin depth: 10 cm Test dose: negative  Assessment Sensory level: T8 Events: blood not aspirated, injection not painful, no injection resistance, negative IV test and no paresthesia  Additional Notes Reason for block:procedure for pain

## 2013-09-02 NOTE — Progress Notes (Signed)
Patient ID: Christine Cobb, female   DOB: September 18, 1975, 38 y.o.   MRN: [id number] Called for possible delivery.  Comfortable with epidural  FHR stable  Cervix 8/90/-2/vtx  Will hold off on pushing until complete.Marland Kitchen

## 2013-09-02 NOTE — Progress Notes (Signed)
Went in to give Haldol, at 2345; patient had been asleep,  was drowsy and mumbled feeling a little dizzy, then went back to sleep immediately.  I held the Haldol and motrin (she had had 2 percocet at 2000), call t/s midwife [phone]) who said it was okay to wait a couple of hours until patient more awake.

## 2013-09-03 ENCOUNTER — Encounter (HOSPITAL_COMMUNITY): Payer: Self-pay | Admitting: Obstetrics & Gynecology

## 2013-09-03 LAB — CBC
HCT: 30.4 % — ABNORMAL LOW (ref 36.0–46.0)
HEMOGLOBIN: 10.1 g/dL — AB (ref 12.0–15.0)
MCH: 27.7 pg (ref 26.0–34.0)
MCHC: 33.2 g/dL (ref 30.0–36.0)
MCV: 83.3 fL (ref 78.0–100.0)
Platelets: 202 10*3/uL (ref 150–400)
RBC: 3.65 MIL/uL — AB (ref 3.87–5.11)
RDW: 16.9 % — ABNORMAL HIGH (ref 11.5–15.5)
WBC: 19.3 10*3/uL — ABNORMAL HIGH (ref 4.0–10.5)

## 2013-09-03 LAB — ABO/RH: ABO/RH(D): O NEG

## 2013-09-03 MED ORDER — RHO D IMMUNE GLOBULIN 1500 UNIT/2ML IJ SOLN
300.0000 ug | Freq: Once | INTRAMUSCULAR | Status: AC
  Administered 2013-09-03: 300 ug via INTRAMUSCULAR
  Filled 2013-09-03: qty 2

## 2013-09-03 NOTE — Clinical Social Work Maternal (Signed)
Clinical Social Work Department PSYCHOSOCIAL ASSESSMENT - MATERNAL/CHILD 09/03/2013  Patient:  DESHANNON, SEIDE  Account Number:  [id number]  Admit Date:  09/01/2013  Ardine Eng Name:   Varney Daily    Clinical Social Worker:  Gerri Spore, LCSW   Date/Time:  09/03/2013 01:06 PM  Date Referred:  09/03/2013   Referral source  CN     Referred reason  Substance Abuse  Behavioral Health Issues   Other referral source:    I:  FAMILY / HOME ENVIRONMENT Child's legal guardian:  Carlton - Name Guardian - Age Guardian - Address  Desirre Eickhoff [address] [place], [place] [address]  Raeford Evon 30    Other household support members/support persons Name Relationship DOB  Celeste    Other support:    II  PSYCHOSOCIAL DATA Information Source:  Patient Interview  Occupational hygienist Employment:   Fish farm manager resources:  Medicaid If [place]:  [place] Other  Food Stamps  [place] / Grade:   Maternity Care Coordinator / Child Services Coordination / Early Interventions:  Cultural issues impacting care:    III  STRENGTHS Strengths  Adequate Resources  Home prepared for Child (including basic supplies)  Supportive family/friends   Strength comment:    IV  RISK FACTORS AND CURRENT PROBLEMS Current Problem:  YES   Risk Factor & Current Problem Patient Issue Family Issue Risk Factor / Current Problem Comment  Mental Illness Y N Hx of Bipolar disorder  Substance Abuse Y N Hx of MJ & cocaine use    V  SOCIAL WORK ASSESSMENT CSW met with pt in her 1st floor hospital room to assess her current mental health state & inquire about substance use history.  Pt was resting when this writer entered the room but welcomed CSW to talk with her.  Psych encounter (06/01/13) noted by CSW.  Pt was initially diagnosed with Bipolar disorder, Schizoaffective disorder,  PTSD & "3 personalities" in 2001.  She established mental health care at the [facility] in 2006.  According to pt, she has maintained monthly mental health services since then. Currently, the pt is prescribed Haldol 13m, of which she took during the pregnancy.  She plans to continue taking her medication upon discharge & [facility]  CSW inquired about pts substance abuse history & she admitted to smoking MJ "everyday, 3-4 times" during the pregnancy.  CSW thanked pt for honesty & asked about frequency of cocaine use.  Pt stood up, as if she were in shock & denied any cocaine use in 3 years.  CSW informed pt about positive UDS results.  Pt told CSW that the cocaine may have been in the MJ or cigarette that someone gave her & continued to deny recent cocaine use.  Pt started to tell CSW about her involvement in the church & strong belief in God.  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  She denies other illegal substance use.  CSW explained hospital drug testing policy & informed pt that a Child Protective Services report would be made.  Pt became agitated & questioned this writer why a report had to be made since she did not knowingly use cocaine.  CSW explained hospital  policy to pt again & encouraged her to be honest with CPS staff.  Pt is concerned that her baby will be removed, as a result of positive drug screen.  She as a 31 year old son, who lives with his father in [place].  Pt told CSW that she raised him until he was 36 years old.  She denies any previous CPS involvement.  Pt has majority of supplies for the infant.  She plans to purchase a car seat from the hospital for $30.  She identifies her fiance as her primary support person.  CSW has concerns about pts "psychotic" episode in December '14, her ability to care for a newborn  independently & current substance abuse.  CSW thinks the pt would benefit from a psych consult.  MD aware.  As a result, CSW reported concerns to CPS.  The case was assigned to Loree Fee [phone].  CSW will follow up with Caryl Pina, follow recommendations from psychiatrist (if any) & update the medical team about discharge plans for the infant.  CSW will also continue to monitor drug screen results.  CSW available to assist further as needed.      VI SOCIAL WORK PLAN Social Work Plan  Child Scientist, forensic Report  Psychosocial Support/Ongoing Assessment of Needs   Type of pt/family education:   If child protective services report - county:  [place] If child protective services report - date:  09/03/2013 Information/referral to community resources comment:   Other social work plan:

## 2013-09-03 NOTE — Progress Notes (Signed)
Baby education attempted with mother. Mother does not seem to retain information.

## 2013-09-03 NOTE — Progress Notes (Signed)
Baby sent to nursery multiple times today per mother's request. Fed and diaper changed in nursery with each visit.

## 2013-09-03 NOTE — Progress Notes (Signed)
In-patient psychiatry consult has to be made. Not allowed to do tele-psych. Psychiatrist supposed to be around tomorrow to assess pt per phone call I received from behavioral health.

## 2013-09-03 NOTE — Progress Notes (Signed)
Post Partum Day 1 Subjective: up ad lib, voiding and bottlefeeding.  Objective: Blood pressure 116/71, pulse 100, temperature 97.5 F (36.4 C), temperature source Oral, resp. rate 19, height 5\' 5"  (1.651 m), weight 70.761 kg (156 lb), last menstrual period 11/28/2012, SpO2 98.00%, unknown if currently breastfeeding.  Physical Exam:  General: alert, cooperative and appears stated age CVS:  RRR, without murmur, gallops, or rubs Lungs:  CTA bilat ABD:  +BSx4, normal Lochia: appropriate Uterine Fundus: firm DVT Evaluation: No evidence of DVT seen on physical exam. Negative Homan's sign.  Recent Labs  09/02/13 1230 09/03/13 0540  HGB 10.6* 10.1*  HCT 31.6* 30.4*    Assessment/Plan: Plan for discharge tomorrow and Social Work consult   LOS: 2 days   [facility] 09/03/2013, 7:28 AM

## 2013-09-03 NOTE — BH Assessment (Signed)
Call made to Dr Ike Bene regarding TTS request. Patient had positive UDS positive for both THC and cocaine. CPS was contacted and have made visit to patient, TTS requested at 4:12PM 09/03/2013. Patient will need to be seen by Psychiatrist as she is currently admitted to a medical floor. Psych consult request entered in log. Both Dr Ike Bene and pt's RN informed of requirement for Psychiatry Consult.  Dr Ike Bene requested Dr Marice Potter be contacted after psych consult made. Pager number entered in log for Dr Marice Potter. Carney Bern, LCSW

## 2013-09-03 NOTE — Progress Notes (Signed)
Pt requesting to leave to go home and get home ready for CPS. Contacted MD regarding situation. Will try and complete tele-psych consult on pt. Pt still to leave tomorrow morning.

## 2013-09-04 LAB — RH IG WORKUP (INCLUDES ABO/RH)
ABO/RH(D): O NEG
ANTIBODY SCREEN: NEGATIVE
FETAL SCREEN: NEGATIVE
GESTATIONAL AGE(WKS): 38.5
Unit division: 0

## 2013-09-04 MED ORDER — IBUPROFEN 600 MG PO TABS: 600.0000 mg | ORAL_TABLET | Freq: Four times a day (QID) | ORAL | Status: DC

## 2013-09-04 MED ORDER — MEDROXYPROGESTERONE ACETATE 150 MG/ML IM SUSP: 150.0000 mg | Freq: Once | INTRAMUSCULAR | Status: DC

## 2013-09-04 NOTE — Discharge Summary (Signed)
Obstetric Discharge Summary Reason for Admission: onset of labor Prenatal Procedures: none Intrapartum Procedures: spontaneous vaginal delivery Postpartum Procedures: Rho(D) Ig Complications-Operative and Postpartum: none Hemoglobin  Date Value Ref Range Status  09/03/2013 10.1* 12.0 - 15.0 g/dL Final     HCT  Date Value Ref Range Status  09/03/2013 30.4* 36.0 - 46.0 % Final    Discharge Diagnoses: Term Pregnancy-delivered  Hospital Course:  Christine Cobb is a 38 y.o. [id number] who presented with active labor.  She had a uncomplicated SVD. She was able to ambulate, tolerate PO and void normally. Social and Psychology were consulted due to Cocaine in baby's drug screen. She was given Depo while in hospital and discharged home with instructions for postpartum care.    Delivery Note  At 9:17 AM a viable female was delivered via SVD (Presentation:OA ;LOA ). APGAR: 9 and 9; crying baby placed on mother's chest. Weight 6lb 4oz.  Placenta status: Spontaneously delivered intact. Cord: pending. Cord pH: Not collected   Anesthesia: Epidural  Episiotomy: N/A  Lacerations: N/A  Suture Repair: N/A  Est. Blood Loss (mL): 350  Mom to postpartum. Baby to Couplet care / Skin to Skin.  Christine Cobb was noted to be complete and delivered a viable baby boy after pushing over 3 contractions. Uterus was noted to be boggy near fundus, and 800 mg cytotec was given PR. Uterus firming, and bleeding ceased. Counts correct x2.   Physical Exam:  General: alert, cooperative and no distress Lochia: appropriate Uterine Fundus: firm DVT Evaluation: No evidence of DVT seen on physical exam. Negative Homan's sign. No cords or calf tenderness.  Discharge Information: Date: 09/04/2013 Activity: pelvic rest Diet: routine Medications: Ibuprofen and Resumed Haldol and PTU  Baby feeding: plans to bottle feed, Will contact [facility] to attempt to switch Haldol to a medication more suitable for  breast-feeding Contraception: Depo-Provera Condition: stable Instructions: refer to practice specific booklet Discharge to: home  Newborn Data: Live born female  Birth Weight: 6 lb 4 oz (2835 g) APGAR: 9, 9  Home with mother.  [name], MD [facility] PGY-1 09/04/2013, 8:44 AM  Evaluation and management procedures were performed by Resident physician under my supervision/collaboration. Chart reviewed, patient examined by me and I agree with management and plan except as stated below Filed Vitals:   09/04/13 0536  BP: 111/76  Pulse: 94  Temp: 97.4 F (36.3 C)  Resp: 20   D/C DX:  PPD#2 NSVD, Bipolar disorder, Hyperthyroidism, Substance abuse Neonate's drug screen positive for cocaine> SS involved and baby not going home today, SS to do home visit. CPS to see pt today and is arranging psych consult pre-discharge  Follow-up Information   Follow up with [facility]. Schedule an appointment as soon as possible for a visit in 4 weeks. (For post-partum visit)    Specialty:  Obstetrics and Gynecology   Contact information:   [address] [place] [address] [phone]      Danae Orleans, CNM 09/04/2013 9:18 AM   [phone]: CTSP wants to leave AMA without seeing CPS or psych consultant> Unable to convince her to stay for that and she will sign AMA form. I discussed with Peds. Rn to [employer].   Attestation of Attending Supervision of Advanced Practitioner (CNM/NP): Evaluation and management procedures were performed by the Advanced Practitioner under my supervision and collaboration.  I have reviewed the Advanced Practitioner's note and chart, and I agree with the management and plan.  Marcianne Ozbun 09/04/2013 10:14  AM     

## 2013-09-04 NOTE — Discharge Instructions (Signed)

## 2013-09-04 NOTE — Progress Notes (Signed)
CSW spoke with Ardelle Park at [facility]) this morning to inquire about the status of Pysch consult.  Latoya verified that pts name in on the list & will ask that pt is made a priority, since she is scheduled for discharge today.  CSW will continue to follow up.

## 2013-09-05 NOTE — Progress Notes (Signed)
Post discharge chart review completed.  

## 2013-09-06 ENCOUNTER — Encounter: Payer: Self-pay | Admitting: Obstetrics & Gynecology

## 2013-09-07 NOTE — MAU Provider Note (Signed)
`````  Attestation of Attending Supervision of Advanced Practitioner: Evaluation and management procedures were performed by the PA/NP/CNM/OB Fellow under my supervision/collaboration. Chart reviewed and agree with management and plan.  Savonna Birchmeier V 09/07/2013 4:53 PM    

## 2013-09-09 ENCOUNTER — Encounter: Payer: Self-pay | Admitting: *Deleted

## 2013-09-13 ENCOUNTER — Telehealth: Payer: Self-pay | Admitting: General Practice

## 2013-09-13 NOTE — Telephone Encounter (Signed)
Patient came up to front office stating she left her Remus Loffler Rx at home and she's already out and wants [place] to give her another Remus Loffler Rx so she doesn't have to go back home and get the prescription and take it back out. Patient then left while waiting for a nurse and said to call her. Called patient and she stated she needed [place] to call in her Remus Loffler Rx so she doesn't have to go home and get it and she doesn't know where it is in her house anyway. Told patient that we couldn't give her another Rx since she already has one especially since this is a controlled substance. 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 Told patient again that we would be unable to do this for her since it is a controlled substance and she already has the prescription so we cannot give her another. Patient continued to argue with me and then stated that's fine okay, have a nice day. Patient had no further questions

## 2013-09-16 ENCOUNTER — Ambulatory Visit: Payer: Self-pay

## 2013-09-16 NOTE — Lactation Note (Signed)
This note was copied from the chart of Christine Cobb. Lactation Consultation Note  Patient Name: Christine Lillia Desmet [id number]'[id number] Date: 09/16/2013 Reason for consult: Initial assessment   Maternal Data Reason for exclusion: Mother's choice to formula feed on admision  Feeding Feeding Type: Bottle Fed - Formula Nipple Type: Regular  LATCH Score/Interventions                      Lactation Tools Discussed/Used     Consult Status Consult Status: Complete    Alfred Levins 09/16/2013, 1:58 PM

## 2013-09-21 ENCOUNTER — Encounter: Payer: Self-pay | Admitting: Medical

## 2013-09-21 ENCOUNTER — Ambulatory Visit (INDEPENDENT_AMBULATORY_CARE_PROVIDER_SITE_OTHER): Payer: Medicare Other | Admitting: Medical

## 2013-09-21 VITALS — BP 147/103 | HR 105 | Temp 99.1°F | Ht 64.0 in | Wt 144.8 lb

## 2013-09-21 DIAGNOSIS — Z309 Encounter for contraceptive management, unspecified: Secondary | ICD-10-CM | POA: Diagnosis not present

## 2013-09-21 DIAGNOSIS — IMO0001 Reserved for inherently not codable concepts without codable children: Secondary | ICD-10-CM

## 2013-09-21 DIAGNOSIS — E0590 Thyrotoxicosis, unspecified without thyrotoxic crisis or storm: Secondary | ICD-10-CM | POA: Insufficient documentation

## 2013-09-21 LAB — POCT PREGNANCY, URINE: Preg Test, Ur: NEGATIVE

## 2013-09-21 MED ORDER — MEDROXYPROGESTERONE ACETATE 104 MG/0.65ML ~~LOC~~ SUSP: 104.0000 mg | SUBCUTANEOUS | Status: DC

## 2013-09-21 MED ORDER — MEDROXYPROGESTERONE ACETATE 104 MG/0.65ML ~~LOC~~ SUSP
104.0000 mg | Freq: Once | SUBCUTANEOUS | Status: AC
  Administered 2013-09-21: 104 mg via SUBCUTANEOUS

## 2013-09-21 NOTE — Patient Instructions (Signed)

## 2013-09-21 NOTE — Progress Notes (Signed)
Patient ID: Christine Cobb, female   DOB: 09-Nov-1975, 38 y.o.   MRN: [id number]  Patient not seen by provider. Here today requesting Depo Provera. Does not have time to stay for visit. Will schedule PP visit at 6 weeks  Freddi Starr, PA-C 09/21/2013 10:00 AM

## 2013-09-22 ENCOUNTER — Emergency Department (HOSPITAL_COMMUNITY)
Admission: EM | Admit: 2013-09-22 | Discharge: 2013-09-22 | Discharge status: Against Medical Advice | Payer: Medicare Other | Source: Home / Self Care | Attending: Emergency Medicine | Admitting: Emergency Medicine

## 2013-09-22 ENCOUNTER — Encounter (HOSPITAL_COMMUNITY): Payer: Self-pay | Admitting: Emergency Medicine

## 2013-09-22 ENCOUNTER — Emergency Department (HOSPITAL_COMMUNITY): Payer: Medicare Other

## 2013-09-22 ENCOUNTER — Emergency Department (HOSPITAL_COMMUNITY)
Admission: EM | Admit: 2013-09-22 | Discharge: 2013-09-22 | Disposition: A | Discharge status: Home / Self Care | Payer: Medicare Other | Attending: Emergency Medicine | Admitting: Emergency Medicine

## 2013-09-22 DIAGNOSIS — R05 Cough: Secondary | ICD-10-CM | POA: Diagnosis not present

## 2013-09-22 DIAGNOSIS — Z880 Allergy status to penicillin: Secondary | ICD-10-CM | POA: Insufficient documentation

## 2013-09-22 DIAGNOSIS — F17200 Nicotine dependence, unspecified, uncomplicated: Secondary | ICD-10-CM | POA: Diagnosis not present

## 2013-09-22 DIAGNOSIS — I10 Essential (primary) hypertension: Secondary | ICD-10-CM | POA: Insufficient documentation

## 2013-09-22 DIAGNOSIS — Z970 Presence of artificial eye: Secondary | ICD-10-CM | POA: Diagnosis not present

## 2013-09-22 DIAGNOSIS — Z79899 Other long term (current) drug therapy: Secondary | ICD-10-CM | POA: Insufficient documentation

## 2013-09-22 DIAGNOSIS — R000 Tachycardia, unspecified: Secondary | ICD-10-CM | POA: Insufficient documentation

## 2013-09-22 DIAGNOSIS — R0602 Shortness of breath: Secondary | ICD-10-CM | POA: Diagnosis not present

## 2013-09-22 DIAGNOSIS — E0590 Thyrotoxicosis, unspecified without thyrotoxic crisis or storm: Secondary | ICD-10-CM | POA: Insufficient documentation

## 2013-09-22 DIAGNOSIS — R509 Fever, unspecified: Secondary | ICD-10-CM | POA: Insufficient documentation

## 2013-09-22 DIAGNOSIS — I509 Heart failure, unspecified: Secondary | ICD-10-CM | POA: Diagnosis not present

## 2013-09-22 DIAGNOSIS — F3111 Bipolar disorder, current episode manic without psychotic features, mild: Secondary | ICD-10-CM | POA: Diagnosis not present

## 2013-09-22 DIAGNOSIS — J441 Chronic obstructive pulmonary disease with (acute) exacerbation: Secondary | ICD-10-CM | POA: Diagnosis not present

## 2013-09-22 DIAGNOSIS — Z791 Long term (current) use of non-steroidal anti-inflammatories (NSAID): Secondary | ICD-10-CM | POA: Diagnosis not present

## 2013-09-22 DIAGNOSIS — R059 Cough, unspecified: Secondary | ICD-10-CM | POA: Diagnosis not present

## 2013-09-22 LAB — RAPID STREP SCREEN (MED CTR MEBANE ONLY): STREPTOCOCCUS, GROUP A SCREEN (DIRECT): NEGATIVE

## 2013-09-22 MED ORDER — LEVOFLOXACIN 750 MG PO TABS: 750.0000 mg | ORAL_TABLET | Freq: Every day | ORAL | Status: DC

## 2013-09-22 MED ORDER — ALBUTEROL SULFATE HFA 108 (90 BASE) MCG/ACT IN AERS
2.0000 | INHALATION_SPRAY | Freq: Once | RESPIRATORY_TRACT | Status: AC
  Administered 2013-09-22: 2 via RESPIRATORY_TRACT
  Filled 2013-09-22: qty 6.7

## 2013-09-22 MED ORDER — SODIUM CHLORIDE 0.9 % IV BOLUS (SEPSIS): 1000.0000 mL | Freq: Once | INTRAVENOUS | Status: DC

## 2013-09-22 MED ORDER — IPRATROPIUM-ALBUTEROL 0.5-2.5 (3) MG/3ML IN SOLN
3.0000 mL | Freq: Once | RESPIRATORY_TRACT | Status: AC
  Administered 2013-09-22: 3 mL via RESPIRATORY_TRACT
  Filled 2013-09-22: qty 3

## 2013-09-22 MED ORDER — ACETAMINOPHEN 325 MG PO TABS
650.0000 mg | ORAL_TABLET | Freq: Once | ORAL | Status: DC
  Filled 2013-09-22: qty 2

## 2013-09-22 MED ORDER — ACETAMINOPHEN 325 MG PO TABS
650.0000 mg | ORAL_TABLET | Freq: Once | ORAL | Status: AC
  Administered 2013-09-22: 650 mg via ORAL
  Filled 2013-09-22: qty 2

## 2013-09-22 MED ORDER — LEVOFLOXACIN 750 MG PO TABS
750.0000 mg | ORAL_TABLET | Freq: Once | ORAL | Status: AC
  Administered 2013-09-22: 750 mg via ORAL
  Filled 2013-09-22: qty 1

## 2013-09-22 NOTE — ED Notes (Addendum)
Pt noted to be sitting at the front of w/r, encouraged to be seen if she still wanted to be seen. Called x3 with no answer explained. Pt states, "I heard you calling for me, but i decided I wasn't going to be seen and called a taxi", "I was upset because they tried to put EKG stickers on me and you are not suppose to do that". "You are suppose to put the wires on the stickers and lay them on the bed".  Then explained that she "did not get albuterol inhaler earlier and she needs that". Encouraged to stay. Wait, plan and process explained with rationale.  Alert, NAD, calm, interactive, resps e/u, speaking in clear complete sentences, steady gait.

## 2013-09-22 NOTE — ED Notes (Addendum)
Pt. Is uncooperative at times on the plan care, i.e. blood draws, x-rays needed to diagnose and treat safely.

## 2013-09-22 NOTE — ED Notes (Signed)
Pt c/o cough that started 2 days ago. Pt stated her spouse told her she has bronchitis and that he has bronchitis. Pt stated that she has been coughing up "green and gray" mucous. Also stated that she smokes cigarettes a pack a day.

## 2013-09-22 NOTE — ED Notes (Signed)
Here this am form 7am - 8am; we were to stick an iv and go to radiology but had to leave to go to work; so, prescribed Levaquin and states, "not working."

## 2013-09-22 NOTE — ED Provider Notes (Signed)
CSN: [id number]     Arrival date & time 09/22/13  0706 History   First MD Initiated Contact with Patient 09/22/13 [phone]     Chief Complaint  Patient presents with  . Cough     (Consider location/radiation/quality/duration/timing/severity/associated sxs/prior Treatment) The history is provided by the patient.  Christine Cobb is a 38 y.o. female hx of HTN, COPD, current smoker here with cough. Cough for the last 2-3 days. Productive with greenish sputum. Also subjective fever for the last few days. She was told by her husband that she has bronchitis but didn't want to go to the doctor. She hasn't been able to sleep last night due to the cough. No urinary symptoms, denies being pregnant.    Past Medical History  Diagnosis Date  . Sinus tachycardia   . HTN (hypertension)   . Arm pain   . Eye globe prosthesis   . CHF (congestive heart failure)   . Bipolar affective disorder, currently manic, mild   . Hyperthyroidism    Past Surgical History  Procedure Laterality Date  . Eye surgery    . Dilation and curettage of uterus     Family History  Problem Relation Age of Onset  . Hypertension Other   . Emphysema Other   . Asthma Son   . Diabetes Maternal Uncle   . Diabetes Paternal Grandmother    History  Substance Use Topics  . Smoking status: Current Every Day Smoker -- 1.00 packs/day    Types: Cigarettes    Last Attempt to Quit: 08/21/2013  . Smokeless tobacco: Never Used  . Alcohol Use: No   OB History   Grav Para Term Preterm Abortions TAB SAB Ect Mult Living   5 2 2  1  0 1   2     Review of Systems  Constitutional: Positive for fever.  Respiratory: Positive for cough.   All other systems reviewed and are negative.      Allergies  Amoxil; Trazodone and nefazodone; Ketorolac tromethamine; Prenatal; and Tegretol  Home Medications   Current Outpatient Rx  Name  Route  Sig  Dispense  Refill  . ibuprofen (ADVIL,MOTRIN) 600 MG tablet   Oral   Take 1 tablet (600  mg total) by mouth 4 (four) times daily.   30 tablet   0   . propylthiouracil (PTU) 50 MG tablet   Oral   Take 1 tablet (50 mg total) by mouth daily.   60 tablet   1   . zolpidem (AMBIEN) 5 MG tablet   Oral   Take 5 mg by mouth at bedtime as needed for sleep.          BP 150/111  Pulse 135  Temp(Src) 102 F (38.9 C) (Oral)  Ht 5\' 4"  (1.626 m)  Wt 145 lb (65.772 kg)  BMI 24.88 kg/m2  SpO2 97%  Breastfeeding? No Physical Exam  Nursing note and vitals reviewed. Constitutional: She is oriented to person, place, and time.  Coughing, uncomfortable   HENT:  Head: Normocephalic.  Mouth/Throat: Oropharynx is clear and moist.  Eyes: Conjunctivae are normal. Pupils are equal, round, and reactive to light.  Neck: Normal range of motion. Neck supple.  Cardiovascular: Regular rhythm and normal heart sounds.   Tachycardic   Pulmonary/Chest:  Increased effort, mild diffuse wheezing. Mod air movement. No retractions. Speaking in full sentences   Abdominal: Soft. Bowel sounds are normal. She exhibits no distension. There is no tenderness. There is no rebound and no guarding.  Musculoskeletal: Normal range of motion. She exhibits no edema and no tenderness.  Neurological: She is alert and oriented to person, place, and time. No cranial nerve deficit. Coordination normal.  Skin: Skin is warm and dry.  Psychiatric: She has a normal mood and affect. Her behavior is normal. Judgment and thought content normal.    ED Course  Procedures (including critical care time) Labs Review Labs Reviewed  RAPID STREP SCREEN   Imaging Review No results found.   EKG Interpretation None      MDM   Final diagnoses:  None   Rosabella V Dan Cobb is a 38 y.o. female here with cough, fever. Tachycardia likely from fever. Refused IV placement. Will get cxr to r/o pneumonia. Will give duoneb for possible COPD exacerbation. No signs of CHF.   7:55 AM Patient refused CXR and states that she needs to  leave. Will give levaquin empirically and albuterol. Return precautions given.   Richardean Canalavid H Yao, MD 09/22/13 [phone]

## 2013-09-22 NOTE — ED Notes (Signed)
No answer, not in w/r, h/w, entryway, b/r, triage or xray. Unable to find.

## 2013-09-22 NOTE — ED Notes (Signed)
Refuses blood work.

## 2013-09-22 NOTE — ED Notes (Signed)
Pt. Irate

## 2013-09-22 NOTE — ED Notes (Signed)
Pt refuses to have EKG done in triage she said that it wont be done unless she is laying in a bed Ed RN notified

## 2013-09-22 NOTE — Discharge Instructions (Signed)
Use albuterol every 4 hrs for 2 days.   STOP smoking.   Take levaquin daily for 7 days.   Follow up with your doctor.   Return to ER if you have fever for a week, worse cough, shortness of breath.

## 2013-09-22 NOTE — ED Notes (Signed)
Pt refusing to have xray, stated that she just wanted an antibiotic for her bronchitis and that she had to go to work. EDP notified.

## 2013-09-22 NOTE — ED Notes (Signed)
No answer for xray, unable to find.

## 2013-09-22 NOTE — ED Notes (Signed)
General announcement to w/r: updated on wait, plan & process with rationale.

## 2013-09-22 NOTE — ED Notes (Signed)
Pt. Irate b/c lack of service  For an 1 hr. Pt. Sob and anxious. Encouraged pt. To relax and explain delay but insistent to leave. The PA-C made aware and at bedside. Pt. Still refused and left AMA.

## 2013-09-22 NOTE — ED Provider Notes (Signed)
CSN: [id number]     Arrival date & time 09/22/13  2048 History   First MD Initiated Contact with Patient 09/22/13 2257     Chief Complaint  Patient presents with  . Shortness of Breath     (Consider location/radiation/quality/duration/timing/severity/associated sxs/prior Treatment) Patient is a 38 y.o. female presenting with shortness of breath. The history is provided by the patient and medical records. No language interpreter was used.  Shortness of Breath Associated symptoms: cough and fever   Associated symptoms: no abdominal pain, no chest pain, no diaphoresis, no headaches, no rash, no vomiting and no wheezing     Christine Cobb is a 38 y.o. female  with a hx of hypertension, COPD who is a current smoker presents to the Emergency Department complaining of gradual, persistent, progressively worsening cough for the last 2-3 days. She reports that it is productive of greenish sputum. She is a reports a subjective fever for the last few days but has not measured her temperature.  She reports she was seen here this morning for the same and was discharged with Levaquin and albuterol inhaler. She reports that she is no better this afternoon. She reports that she is unable to sleep at night due to her cough.  Patient reports associated shortness of breath and palpitations. She also reports that she is 20 days postpartum.  She denies hemoptysis, recent surgeries or recent immobilization. Patient denies headache, neck pain, neck stiffness, chest pain, abdominal pain, nausea, vomiting, diarrhea, weakness, dizziness, syncope, dysuria, hematuria.    Past Medical History  Diagnosis Date  . Sinus tachycardia   . HTN (hypertension)   . Arm pain   . Eye globe prosthesis   . CHF (congestive heart failure)   . Bipolar affective disorder, currently manic, mild   . Hyperthyroidism    Past Surgical History  Procedure Laterality Date  . Eye surgery    . Dilation and curettage of uterus     Family  History  Problem Relation Age of Onset  . Hypertension Other   . Emphysema Other   . Asthma Son   . Diabetes Maternal Uncle   . Diabetes Paternal Grandmother    History  Substance Use Topics  . Smoking status: Current Every Day Smoker -- 1.00 packs/day    Types: Cigarettes    Last Attempt to Quit: 08/21/2013  . Smokeless tobacco: Never Used  . Alcohol Use: No   OB History   Grav Para Term Preterm Abortions TAB SAB Ect Mult Living   5 2 2  1  0 1   2     Review of Systems  Constitutional: Positive for fever. Negative for diaphoresis, appetite change, fatigue and unexpected weight change.  HENT: Negative for mouth sores.   Eyes: Negative for visual disturbance.  Respiratory: Positive for cough and shortness of breath. Negative for chest tightness and wheezing.   Cardiovascular: Negative for chest pain.  Gastrointestinal: Negative for nausea, vomiting, abdominal pain, diarrhea and constipation.  Endocrine: Negative for polydipsia, polyphagia and polyuria.  Genitourinary: Negative for dysuria, urgency, frequency and hematuria.  Musculoskeletal: Negative for back pain and neck stiffness.  Skin: Negative for rash.  Allergic/Immunologic: Negative for immunocompromised state.  Neurological: Negative for syncope, light-headedness and headaches.  Hematological: Does not bruise/bleed easily.  Psychiatric/Behavioral: Negative for sleep disturbance. The patient is not nervous/anxious.       Allergies  Amoxil; Trazodone and nefazodone; Ketorolac tromethamine; Prenatal; and Tegretol  Home Medications   Current Outpatient Rx  Name  Route  Sig  Dispense  Refill  . haloperidol (HALDOL) 5 MG tablet   Oral   Take 10 mg by mouth at bedtime and may repeat dose one time if needed.         Marland Kitchen ibuprofen (ADVIL,MOTRIN) 600 MG tablet   Oral   Take 1 tablet (600 mg total) by mouth 4 (four) times daily.   30 tablet   0   . propylthiouracil (PTU) 50 MG tablet   Oral   Take 1 tablet (50  mg total) by mouth daily.   60 tablet   1    BP 152/96  Pulse 126  Temp(Src) 99.2 F (37.3 C) (Oral)  Resp 24  Ht 5\' 4"  (1.626 m)  Wt 145 lb (65.772 kg)  BMI 24.88 kg/m2  SpO2 97% Physical Exam  Nursing note and vitals reviewed. Constitutional: She is oriented to person, place, and time. She appears well-developed and well-nourished. No distress.  Awake, alert, nontoxic appearance  HENT:  Head: Normocephalic and atraumatic.  Right Ear: Tympanic membrane, external ear and ear canal normal.  Left Ear: Tympanic membrane, external ear and ear canal normal.  Nose: Mucosal edema and rhinorrhea present. No epistaxis. Right sinus exhibits no maxillary sinus tenderness and no frontal sinus tenderness. Left sinus exhibits no maxillary sinus tenderness and no frontal sinus tenderness.  Mouth/Throat: Uvula is midline, oropharynx is clear and moist and mucous membranes are normal. Mucous membranes are not pale and not cyanotic. No oropharyngeal exudate, posterior oropharyngeal edema, posterior oropharyngeal erythema or tonsillar abscesses.  Eyes: Conjunctivae are normal. Pupils are equal, round, and reactive to light. No scleral icterus.  Neck: Normal range of motion and full passive range of motion without pain. Neck supple.  Cardiovascular: Regular rhythm, normal heart sounds and intact distal pulses.   No murmur heard. Tachycardia to 130  Pulmonary/Chest: Breath sounds normal. No accessory muscle usage or stridor. Tachypnea noted. No respiratory distress. She has no decreased breath sounds. She has no wheezes. She has no rhonchi. She has no rales.  Course breath sounds throughout Patient coughing Tachypnea without accessory muscle usage  Abdominal: Soft. Bowel sounds are normal. She exhibits no mass. There is no tenderness. There is no rigidity, no rebound, no guarding and no CVA tenderness.  Soft and nontender  Musculoskeletal: Normal range of motion. She exhibits no edema.   Lymphadenopathy:    She has no cervical adenopathy.  Neurological: She is alert and oriented to person, place, and time.  Speech is clear and goal oriented Moves extremities without ataxia  Skin: Skin is warm and dry. No rash noted. She is not diaphoretic.  Patient skin hot to touch  Psychiatric: She has a normal mood and affect.    ED Course  Procedures (including critical care time) Labs Review Labs Reviewed  BASIC METABOLIC PANEL  CBC  D-DIMER, QUANTITATIVE   Imaging Review No results found.   EKG Interpretation None      MDM   Final diagnoses:  Cough  Tachycardia  Fever  SOB (shortness of breath)   Christine Cobb presents for evaluation of her cough and fevers at home. On my initial evaluation she is tachycardic and tachypneic without accessory muscle use. She has coarse breath sounds throughout without focal findings. She is alert, oriented, nontoxic and nonseptic appearing.  I am concerned about possible pneumonia and I discussed this with her.  Chart Review shows the patient was seen this morning approximately 7 AM for the same symptoms.  At  that time she refused IV placement, fluids and chest x-ray. She was given duo neb here in the emergency department and discharged home with Levaquin and albuterol.  I discussed with the patient initially that we would be doing the same things recommended this morning including a chest x-ray IV fluids and medication for her fever. She agrees to this.  11:32 PM Is now refusing to stay for further evaluation. She states that we have done nothing for her. Nursing notes report the patient has refused blood draws and x-ray several times while waiting in the lobby.  She is agitated, refusing all further treatment.  She is alert and oriented. Patient appears competent and has the capacity to make this decision.  She is able to tell me that if she does not get further treatment there are risks including her dying at home.  She will be  discharged AMA.  Date: 09/22/2013 Patient: Luan PullingChamelia V Cobb Admitted: 09/22/2013  9:40 PM Attending Provider: Jacqualyn PoseyJoshua Zavutz, MD  Luan Pullinghamelia V Cobb or has made the decision for the patient to leave the emergency department against the advice of [facility] Brittney Mucha, PA-C.  She has been informed and understands the inherent risks, including death.  She has decided to accept the responsibility for this decision. Luan Pullinghamelia V Cobb and all necessary parties have been advised that she may return for further evaluation or treatment. Her condition at time of discharge was Fair.  Luan Pullinghamelia V Cobb had current vital signs as follows:  Blood pressure 152/96, pulse 126, temperature 99.2 F (37.3 C), temperature source Oral, resp. rate 24, height 5\' 4"  (1.626 m), weight 145 lb (65.772 kg), SpO2 97.00%, not currently breastfeeding.   Luan Pullinghamelia V Cobb or her authorized caregiver has not signed the Leaving Against Medical Advice form prior to leaving the department.  Jerrilynn Mikowski, Dahlia ClientHannah 09/22/2013      Dierdre ForthHannah Karcyn Menn, PA-C 09/22/13 [phone]

## 2013-09-23 NOTE — ED Provider Notes (Signed)
Medical screening examination/treatment/procedure(s) were performed by non-physician practitioner and as supervising physician I was immediately available for consultation/collaboration.   EKG Interpretation None        Enid Skeens, MD 09/23/13 [phone]

## 2013-09-24 LAB — CULTURE, GROUP A STREP

## 2013-10-05 DIAGNOSIS — R5383 Other fatigue: Secondary | ICD-10-CM | POA: Diagnosis not present

## 2013-10-05 DIAGNOSIS — M79609 Pain in unspecified limb: Secondary | ICD-10-CM | POA: Diagnosis not present

## 2013-10-05 DIAGNOSIS — I509 Heart failure, unspecified: Secondary | ICD-10-CM | POA: Diagnosis not present

## 2013-10-05 DIAGNOSIS — F411 Generalized anxiety disorder: Secondary | ICD-10-CM | POA: Diagnosis not present

## 2013-10-05 DIAGNOSIS — R5381 Other malaise: Secondary | ICD-10-CM | POA: Diagnosis not present

## 2013-10-12 ENCOUNTER — Ambulatory Visit (INDEPENDENT_AMBULATORY_CARE_PROVIDER_SITE_OTHER): Payer: Medicare Other | Admitting: Medical

## 2013-10-12 ENCOUNTER — Encounter: Payer: Self-pay | Admitting: Medical

## 2013-10-12 NOTE — Patient Instructions (Signed)

## 2013-10-12 NOTE — Progress Notes (Signed)
Patient ID: Christine Cobb, female   DOB: [birth year], 38 y.o.   MRN: [id number] Subjective:     RENAI RHYNER is a 38 y.o. female who presents for a postpartum visit. She is 6 weeks postpartum following a spontaneous vaginal delivery. I have fully reviewed the prenatal and intrapartum course. The delivery was at 38.5 gestational weeks. Outcome: spontaneous vaginal delivery. Anesthesia: epidural. Postpartum course has been normal. Baby's course has been normal. Baby is feeding by bottle - generic formula. Bleeding staining only. Bowel function is normal. Bladder function is normal. Patient is sexually active. Contraception method is Depo-Provera injections. Postpartum depression screening: negative.  The following portions of the patient's history were reviewed and updated as appropriate: allergies, current medications, past family history, past medical history, past social history, past surgical history and problem list.  Review of Systems Pertinent items are noted in HPI.   Objective:    BP 119/86  Pulse 98  Temp(Src) 97.3 F (36.3 C) (Oral)  Ht 5\' 4"  (1.626 m)  Wt 145 lb 3.2 oz (65.862 kg)  BMI 24.91 kg/m2  General:  alert and cooperative   Breasts:  not performed  Lungs: normal effort  Heart:  normal rate  Abdomen: soft   Vulva:  not evaluated  Vagina: not evaluated  Cervix:  not evaluated  Corpus: not examined  Adnexa:  not evaluated  Rectal Exam: Not performed.        Assessment:     Normal postpartum exam. Pap smear not done at today's visit.  last pap smear was 02/01/14 and normal. No history of abnormal pap smears per patient. Next pap smear due 02/01/2018  Plan:    1. Contraception: Depo-Provera injections 2. Follow up in: 12/13/13 - 12/28/13 for Depo Provera injection or as needed.

## 2013-11-10 ENCOUNTER — Encounter: Payer: Self-pay | Admitting: *Deleted

## 2013-11-14 ENCOUNTER — Encounter: Payer: Self-pay | Admitting: General Practice

## 2013-11-14 DIAGNOSIS — Z970 Presence of artificial eye: Secondary | ICD-10-CM | POA: Diagnosis not present

## 2013-11-27 ENCOUNTER — Inpatient Hospital Stay (HOSPITAL_COMMUNITY)
Admission: AD | Admit: 2013-11-27 | Discharge: 2013-11-27 | Disposition: A | Discharge status: Home / Self Care | Payer: Medicare Other | Source: Ambulatory Visit | Attending: Obstetrics & Gynecology | Admitting: Obstetrics & Gynecology

## 2013-11-27 ENCOUNTER — Encounter (HOSPITAL_COMMUNITY): Payer: Self-pay

## 2013-11-27 DIAGNOSIS — N921 Excessive and frequent menstruation with irregular cycle: Secondary | ICD-10-CM

## 2013-11-27 DIAGNOSIS — I10 Essential (primary) hypertension: Secondary | ICD-10-CM | POA: Diagnosis not present

## 2013-11-27 DIAGNOSIS — F17200 Nicotine dependence, unspecified, uncomplicated: Secondary | ICD-10-CM | POA: Insufficient documentation

## 2013-11-27 DIAGNOSIS — R21 Rash and other nonspecific skin eruption: Secondary | ICD-10-CM | POA: Diagnosis not present

## 2013-11-27 DIAGNOSIS — N938 Other specified abnormal uterine and vaginal bleeding: Secondary | ICD-10-CM | POA: Diagnosis not present

## 2013-11-27 DIAGNOSIS — N925 Other specified irregular menstruation: Secondary | ICD-10-CM | POA: Diagnosis not present

## 2013-11-27 DIAGNOSIS — N949 Unspecified condition associated with female genital organs and menstrual cycle: Secondary | ICD-10-CM | POA: Insufficient documentation

## 2013-11-27 LAB — URINALYSIS, ROUTINE W REFLEX MICROSCOPIC
Bilirubin Urine: NEGATIVE
Glucose, UA: NEGATIVE mg/dL
Ketones, ur: NEGATIVE mg/dL
Leukocytes, UA: NEGATIVE
Nitrite: NEGATIVE
Protein, ur: NEGATIVE mg/dL
Specific Gravity, Urine: <1.005 — ABNORMAL LOW (ref 1.005–1.030)
Urobilinogen, UA: 0.2 mg/dL (ref 0.0–1.0)
pH: 5.5 (ref 5.0–8.0)

## 2013-11-27 LAB — CBC
HCT: 39.5 % (ref 36.0–46.0)
Hemoglobin: 13.2 g/dL (ref 12.0–15.0)
MCH: 28.6 pg (ref 26.0–34.0)
MCHC: 33.4 g/dL (ref 30.0–36.0)
MCV: 85.7 fL (ref 78.0–100.0)
PLATELETS: 229 10*3/uL (ref 150–400)
RBC: 4.61 MIL/uL (ref 3.87–5.11)
RDW: 13.7 % (ref 11.5–15.5)
WBC: 5.9 10*3/uL (ref 4.0–10.5)

## 2013-11-27 LAB — URINE MICROSCOPIC-ADD ON

## 2013-11-27 LAB — POCT PREGNANCY, URINE: PREG TEST UR: NEGATIVE

## 2013-11-27 MED ORDER — MEDROXYPROGESTERONE ACETATE 10 MG PO TABS: 10.0000 mg | ORAL_TABLET | Freq: Every day | ORAL | Status: DC

## 2013-11-27 NOTE — MAU Provider Note (Signed)
History     CSN: [id number]  Arrival date and time: 11/27/13 7106   First Provider Initiated Contact with Patient 11/27/13 [phone]      Chief Complaint  Patient presents with  . Allergic Reaction   HPI [name] is a 38 y.o. [id number] who presents to MAU today with complaint of irregular bleeding since last Depo injection and rash. The patient was given Depo Provera postpartum on 09/21/13. The patient had used Depo Provera prior to her most recent pregnancy and had amenorrhea. She expected the same results. She states that bleeding has been off and on, sometimes for up to two weeks. She states occasional lower abdominal pain. She denies UTI symptoms, fever, N/V/D or constipation. She also complains of a rash today x 3 weeks. She states small pruritic bumps on her upper arms, chest, back and thighs. She denies any changes in her products. She states a history of eczema. She has tried Eucerin cream which she feels has helped.   OB History   Grav Para Term Preterm Abortions TAB SAB Ect Mult Living   3 2 2  1  0 1   2      Past Medical History  Diagnosis Date  . Sinus tachycardia   . HTN (hypertension)   . Arm pain   . Eye globe prosthesis   . CHF (congestive heart failure)   . Bipolar affective disorder, currently manic, mild   . Hyperthyroidism     Past Surgical History  Procedure Laterality Date  . Eye surgery    . Dilation and curettage of uterus      Family History  Problem Relation Age of Onset  . Hypertension Other   . Emphysema Other   . Asthma Son   . Diabetes Maternal Uncle   . Diabetes Paternal Grandmother     History  Substance Use Topics  . Smoking status: Current Every Day Smoker -- 1.00 packs/day    Types: Cigarettes    Last Attempt to Quit: 08/21/2013  . Smokeless tobacco: Never Used  . Alcohol Use: No    Allergies:  Allergies  Allergen Reactions  . Amoxil [Amoxicillin] Anaphylaxis  . Trazodone And Nefazodone Anaphylaxis  . Ketorolac  Tromethamine Hives and Swelling  . Prenatal [B-Plex Plus] Nausea Only  . Tegretol [Carbamazepine] Other (See Comments)    Swelling, skin peeling, skin discoloration    Prescriptions prior to admission  Medication Sig Dispense Refill  . haloperidol (HALDOL) 5 MG tablet Take 10 mg by mouth at bedtime and may repeat dose one time if needed.      Marland Kitchen ibuprofen (ADVIL,MOTRIN) 600 MG tablet Take 1 tablet (600 mg total) by mouth 4 (four) times daily.  30 tablet  0  . lisinopril (PRINIVIL,ZESTRIL) 20 MG tablet Take 20 mg by mouth daily.      Marland Kitchen propylthiouracil (PTU) 50 MG tablet Take 1 tablet (50 mg total) by mouth daily.  60 tablet  1    Review of Systems  Constitutional: Negative for fever and malaise/fatigue.  Gastrointestinal: Positive for abdominal pain. Negative for nausea, vomiting, diarrhea and constipation.  Genitourinary: Negative for dysuria, urgency and frequency.       Neg - vaginal bleeding, discharge  Skin: Positive for rash.   Physical Exam   Blood pressure 126/97, pulse 81, temperature 98.1 F (36.7 C), temperature source Oral, resp. rate 18, height 5\' 4"  (1.626 m), weight 146 lb 6.4 oz (66.407 kg), last menstrual period 10/21/2013, not currently breastfeeding.  Physical  Exam  Constitutional: She is oriented to person, place, and time. She appears well-developed and well-nourished.  HENT:  Head: Normocephalic.  Cardiovascular: Normal rate, regular rhythm and normal heart sounds.   Respiratory: Effort normal and breath sounds normal. No respiratory distress.  GI: Soft. Bowel sounds are normal. She exhibits no distension and no mass. There is no tenderness. There is no rebound and no guarding.  Neurological: She is alert and oriented to person, place, and time.  Skin: Skin is warm and dry. Rash (few sporadic slightly raised papules noted on the upper arms, chest and back. Evidence of excoriations. ) noted. No erythema.  Psychiatric: She has a normal mood and affect.    Results for orders placed during the hospital encounter of 11/27/13 (from the past 24 hour(s))  URINALYSIS, ROUTINE W REFLEX MICROSCOPIC     Status: Abnormal   Collection Time    11/27/13  6:38 AM      Result Value Ref Range   Color, Urine YELLOW  YELLOW   APPearance CLEAR  CLEAR   Specific Gravity, Urine <1.005 (*) 1.005 - 1.030   pH 5.5  5.0 - 8.0   Glucose, UA NEGATIVE  NEGATIVE mg/dL   Hgb urine dipstick LARGE (*) NEGATIVE   Bilirubin Urine NEGATIVE  NEGATIVE   Ketones, ur NEGATIVE  NEGATIVE mg/dL   Protein, ur NEGATIVE  NEGATIVE mg/dL   Urobilinogen, UA 0.2  0.0 - 1.0 mg/dL   Nitrite NEGATIVE  NEGATIVE   Leukocytes, UA NEGATIVE  NEGATIVE  URINE MICROSCOPIC-ADD ON     Status: None   Collection Time    11/27/13  6:38 AM      Result Value Ref Range   Squamous Epithelial / LPF RARE  RARE   WBC, UA 0-2  <3 WBC/hpf  POCT PREGNANCY, URINE     Status: None   Collection Time    11/27/13  6:51 AM      Result Value Ref Range   Preg Test, Ur NEGATIVE  NEGATIVE  CBC     Status: None   Collection Time    11/27/13  7:45 AM      Result Value Ref Range   WBC 5.9  4.0 - 10.5 K/uL   RBC 4.61  3.87 - 5.11 MIL/uL   Hemoglobin 13.2  12.0 - 15.0 g/dL   HCT 57.839.5  46.936.0 - 62.946.0 %   MCV 85.7  78.0 - 100.0 fL   MCH 28.6  26.0 - 34.0 pg   MCHC 33.4  30.0 - 36.0 g/dL   RDW 52.813.7  41.311.5 - 24.415.5 %   Platelets 229  150 - 400 K/uL     MAU Course  Procedures None  MDM UPT - negative UA and CBC today  Assessment and Plan  A: Breakthrough bleeding with Depo Provera Rash, probably allergic reaction  P: Discharge home Rx for Provera sent to patient's pharmacy [facility] upcoming appointment to be seen be a provider to discuss other birth control options with better bleeding profiles Discussed all potential birth control options. Patient may just try one more injection prior to changing methods Patient advised of signs of anaphylaxis and [facility]  symptoms arise Continue Eucerin cream and OTC hydrocortisone for itch Patient may return to MAU as needed or if her condition were to change or worsen  Freddi StarrJulie N Ethier, PA-C  11/27/2013, 8:17 AM

## 2013-11-27 NOTE — MAU Note (Signed)
Pt had depo shot April 17th at [facility] visit. Next dose is due June 18th. Wants to try new method. C/o irregular periods and is bleeding now. Pt soaks through 2 pads/day. Pt also c/o rash, itching and discoloration on arms, legs, back.

## 2013-11-27 NOTE — Discharge Instructions (Signed)
Contraception Choices °Birth control (contraception) is the use of any methods or devices to stop pregnancy from happening. Below are some methods to help avoid pregnancy. °HORMONAL BIRTH CONTROL °· A small tube put under the skin of the upper arm (implant). The tube can stay in place for 3 years. The implant must be taken out after 3 years. °· Shots given every 3 months. °· Pills taken every day. °· Patches that are changed once a week. °· A ring put into the vagina (vaginal ring). The ring is left in place for 3 weeks and removed for 1 week. Then, a new ring is put in the vagina. °· Emergency birth control pills taken after unprotected sex (intercourse). °BARRIER BIRTH CONTROL  °· A thin covering worn on the penis (female condom) during sex. °· A soft, loose covering put into the vagina (female condom) before sex. °· A rubber bowl that sits over the cervix (diaphragm). The bowl must be made for you. The bowl is put into the vagina before sex. The bowl is left in place for 6 to 8 hours after sex. °· A small, soft cup that fits over the cervix (cervical cap). The cup must be made for you. The cup can be left in place for 48 hours after sex. °· A sponge that is put into the vagina before sex. °· A chemical that kills or stops sperm from getting into the cervix and uterus (spermicide). The chemical may be a cream, jelly, foam, or pill. °INTRAUTERINE (IUD) BIRTH CONTROL  °· IUD birth control is a small, T-shaped piece of plastic. The plastic is put inside the uterus. There are 2 types of IUD: °· Copper IUD. The IUD is covered in copper wire. The copper makes a fluid that kills sperm. It can stay in place for 10 years. °· Hormone IUD. The hormone stops pregnancy from happening. It can stay in place for 5 years. °PERMANENT METHODS °· When the woman has her fallopian tubes sealed, tied, or blocked during surgery. This stops the egg from traveling to the uterus. °· The doctor places a small coil or insert into each fallopian  tube. This causes scar tissue to form and blocks the fallopian tubes. °· When the female has the tubes that carry sperm tied off (vasectomy). °NATURAL FAMILY PLANNING BIRTH CONTROL  °· Natural family planning means not having sex or using barrier birth control on the days the woman could become pregnant. °· Use a calendar to keep track of the length of each period and know the days she can get pregnant. °· Avoid sex during ovulation. °· Use a thermometer to measure body temperature. Also watch for symptoms of ovulation. °· Time sex to be after the woman has ovulated. °Use condoms to help protect yourself against sexually transmitted infections (STIs). Do this no matter what type of birth control you use. Talk to your doctor about which type of birth control is best for you. °Document Released: 04/11/2009 Document Revised: 02/14/2013 Document Reviewed: 01/03/2013 °ExitCare® Patient Information ©2014 [employer]. ° °

## 2013-12-05 ENCOUNTER — Telehealth: Payer: Self-pay | Admitting: General Practice

## 2013-12-05 NOTE — Telephone Encounter (Signed)
Patient called into front office stating she has been bleeding since she got the depo shot in April and saturates 3 pads a day. She went to MAU last week and was given a pill to stop the bleeding but it didn't help and only made the bleeding worse so she has stopped taking that medication. Discussed with patient that initiation or discontinuing a birth control method can cause irregular bleeding like this unfortunately and as long as shes not having heavy bleeding we aren't concerned about it. Told patient that usually by the 2nd dose she sees improvement afterwards but if she would like to discuss other options she can do that at her visit next week. Patient verbalized understanding and had no further questions

## 2013-12-13 ENCOUNTER — Ambulatory Visit: Payer: Self-pay

## 2013-12-13 ENCOUNTER — Ambulatory Visit: Payer: Self-pay | Admitting: Obstetrics & Gynecology

## 2013-12-17 DIAGNOSIS — I10 Essential (primary) hypertension: Secondary | ICD-10-CM | POA: Diagnosis not present

## 2013-12-24 DIAGNOSIS — A6000 Herpesviral infection of urogenital system, unspecified: Secondary | ICD-10-CM | POA: Diagnosis not present

## 2013-12-24 DIAGNOSIS — I10 Essential (primary) hypertension: Secondary | ICD-10-CM | POA: Diagnosis not present

## 2013-12-26 DIAGNOSIS — E0590 Thyrotoxicosis, unspecified without thyrotoxic crisis or storm: Secondary | ICD-10-CM | POA: Diagnosis not present

## 2014-01-05 ENCOUNTER — Emergency Department (HOSPITAL_COMMUNITY)
Admission: EM | Admit: 2014-01-05 | Discharge: 2014-01-07 | Disposition: A | Discharge status: Home / Self Care | Payer: Medicare Other | Attending: Emergency Medicine | Admitting: Emergency Medicine

## 2014-01-05 DIAGNOSIS — I10 Essential (primary) hypertension: Secondary | ICD-10-CM | POA: Diagnosis not present

## 2014-01-05 DIAGNOSIS — I509 Heart failure, unspecified: Secondary | ICD-10-CM | POA: Insufficient documentation

## 2014-01-05 DIAGNOSIS — M79609 Pain in unspecified limb: Secondary | ICD-10-CM | POA: Insufficient documentation

## 2014-01-05 DIAGNOSIS — E0590 Thyrotoxicosis, unspecified without thyrotoxic crisis or storm: Secondary | ICD-10-CM | POA: Diagnosis not present

## 2014-01-05 DIAGNOSIS — F29 Unspecified psychosis not due to a substance or known physiological condition: Secondary | ICD-10-CM | POA: Insufficient documentation

## 2014-01-05 DIAGNOSIS — Z881 Allergy status to other antibiotic agents status: Secondary | ICD-10-CM | POA: Diagnosis not present

## 2014-01-05 DIAGNOSIS — F3111 Bipolar disorder, current episode manic without psychotic features, mild: Secondary | ICD-10-CM | POA: Diagnosis not present

## 2014-01-05 DIAGNOSIS — Z79899 Other long term (current) drug therapy: Secondary | ICD-10-CM | POA: Insufficient documentation

## 2014-01-05 DIAGNOSIS — Z046 Encounter for general psychiatric examination, requested by authority: Secondary | ICD-10-CM | POA: Diagnosis present

## 2014-01-05 DIAGNOSIS — F02818 Dementia in other diseases classified elsewhere, unspecified severity, with other behavioral disturbance: Secondary | ICD-10-CM | POA: Diagnosis not present

## 2014-01-05 DIAGNOSIS — F319 Bipolar disorder, unspecified: Secondary | ICD-10-CM

## 2014-01-05 DIAGNOSIS — F0281 Dementia in other diseases classified elsewhere with behavioral disturbance: Secondary | ICD-10-CM | POA: Diagnosis not present

## 2014-01-05 DIAGNOSIS — I498 Other specified cardiac arrhythmias: Secondary | ICD-10-CM | POA: Diagnosis not present

## 2014-01-05 DIAGNOSIS — Z970 Presence of artificial eye: Secondary | ICD-10-CM | POA: Insufficient documentation

## 2014-01-05 DIAGNOSIS — Z888 Allergy status to other drugs, medicaments and biological substances status: Secondary | ICD-10-CM | POA: Diagnosis not present

## 2014-01-05 DIAGNOSIS — F17200 Nicotine dependence, unspecified, uncomplicated: Secondary | ICD-10-CM | POA: Insufficient documentation

## 2014-01-05 DIAGNOSIS — R000 Tachycardia, unspecified: Secondary | ICD-10-CM | POA: Diagnosis not present

## 2014-01-05 LAB — COMPREHENSIVE METABOLIC PANEL
ALT: 27 U/L (ref 0–35)
AST: 33 U/L (ref 0–37)
Albumin: 3.9 g/dL (ref 3.5–5.2)
Alkaline Phosphatase: 50 U/L (ref 39–117)
Anion gap: 15 (ref 5–15)
BILIRUBIN TOTAL: 1.2 mg/dL (ref 0.3–1.2)
BUN: 20 mg/dL (ref 6–23)
CALCIUM: 9.4 mg/dL (ref 8.4–10.5)
CHLORIDE: 105 meq/L (ref 96–112)
CO2: 21 meq/L (ref 19–32)
Creatinine, Ser: 1.47 mg/dL — ABNORMAL HIGH (ref 0.50–1.10)
GFR calc Af Amer: 52 mL/min — ABNORMAL LOW (ref >90)
GFR calc non Af Amer: 45 mL/min — ABNORMAL LOW (ref >90)
Glucose, Bld: 82 mg/dL (ref 70–99)
Potassium: 4.8 mEq/L (ref 3.7–5.3)
SODIUM: 141 meq/L (ref 137–147)
Total Protein: 6.4 g/dL (ref 6.0–8.3)

## 2014-01-05 LAB — BASIC METABOLIC PANEL
Anion gap: 12 (ref 5–15)
BUN: 13 mg/dL (ref 6–23)
CO2: 21 mEq/L (ref 19–32)
Calcium: 8.9 mg/dL (ref 8.4–10.5)
Chloride: 106 mEq/L (ref 96–112)
Creatinine, Ser: 1.19 mg/dL — ABNORMAL HIGH (ref 0.50–1.10)
GFR calc Af Amer: 68 mL/min — ABNORMAL LOW (ref >90)
GFR calc non Af Amer: 58 mL/min — ABNORMAL LOW (ref >90)
Glucose, Bld: 116 mg/dL — ABNORMAL HIGH (ref 70–99)
POTASSIUM: 4 meq/L (ref 3.7–5.3)
Sodium: 139 mEq/L (ref 137–147)

## 2014-01-05 LAB — CBC WITH DIFFERENTIAL/PLATELET
BASOS ABS: 0.1 10*3/uL (ref 0.0–0.1)
Basophils Relative: 1 % (ref 0–1)
Eosinophils Absolute: 0.1 10*3/uL (ref 0.0–0.7)
Eosinophils Relative: 1 % (ref 0–5)
HCT: 42.9 % (ref 36.0–46.0)
Hemoglobin: 14.4 g/dL (ref 12.0–15.0)
LYMPHS PCT: 30 % (ref 12–46)
Lymphs Abs: 3.1 10*3/uL (ref 0.7–4.0)
MCH: 28.4 pg (ref 26.0–34.0)
MCHC: 33.6 g/dL (ref 30.0–36.0)
MCV: 84.6 fL (ref 78.0–100.0)
Monocytes Absolute: 1.4 10*3/uL — ABNORMAL HIGH (ref 0.1–1.0)
Monocytes Relative: 13 % — ABNORMAL HIGH (ref 3–12)
NEUTROS ABS: 5.9 10*3/uL (ref 1.7–7.7)
Neutrophils Relative %: 55 % (ref 43–77)
PLATELETS: 248 10*3/uL (ref 150–400)
RBC: 5.07 MIL/uL (ref 3.87–5.11)
RDW: 13.6 % (ref 11.5–15.5)
WBC: 10.6 10*3/uL — AB (ref 4.0–10.5)

## 2014-01-05 LAB — SALICYLATE LEVEL

## 2014-01-05 LAB — ACETAMINOPHEN LEVEL

## 2014-01-05 LAB — RAPID URINE DRUG SCREEN, HOSP PERFORMED
Amphetamines: NOT DETECTED
Barbiturates: NOT DETECTED
Benzodiazepines: NOT DETECTED
COCAINE: NOT DETECTED
Opiates: NOT DETECTED
TETRAHYDROCANNABINOL: NOT DETECTED

## 2014-01-05 LAB — ETHANOL: Alcohol, Ethyl (B): <11 mg/dL (ref 0–11)

## 2014-01-05 LAB — TSH: TSH: 0.913 u[IU]/mL (ref 0.350–4.500)

## 2014-01-05 MED ORDER — HYDROCODONE-ACETAMINOPHEN 5-325 MG PO TABS
1.0000 | ORAL_TABLET | Freq: Once | ORAL | Status: AC
  Administered 2014-01-05: 1 via ORAL
  Filled 2014-01-05: qty 1

## 2014-01-05 MED ORDER — ALPRAZOLAM 0.5 MG PO TABS
0.5000 mg | ORAL_TABLET | Freq: Four times a day (QID) | ORAL | Status: DC | PRN
  Administered 2014-01-05 – 2014-01-07 (×4): 0.5 mg via ORAL
  Filled 2014-01-05 (×5): qty 1

## 2014-01-05 MED ORDER — PROPYLTHIOURACIL 50 MG PO TABS: 50.0000 mg | ORAL_TABLET | Freq: Three times a day (TID) | ORAL | Status: DC

## 2014-01-05 MED ORDER — ZIPRASIDONE MESYLATE 20 MG IM SOLR
10.0000 mg | Freq: Once | INTRAMUSCULAR | Status: DC
  Filled 2014-01-05: qty 20

## 2014-01-05 MED ORDER — SODIUM CHLORIDE 0.9 % IV BOLUS (SEPSIS)
1000.0000 mL | Freq: Once | INTRAVENOUS | Status: AC
  Administered 2014-01-05: 1000 mL via INTRAVENOUS

## 2014-01-05 MED ORDER — LORAZEPAM 2 MG/ML IJ SOLN
1.0000 mg | Freq: Once | INTRAMUSCULAR | Status: AC
  Administered 2014-01-05: 1 mg via INTRAMUSCULAR
  Filled 2014-01-05: qty 1

## 2014-01-05 MED ORDER — STERILE WATER FOR INJECTION IJ SOLN
INTRAMUSCULAR | Status: AC
  Administered 2014-01-05: 1.2 mL
  Filled 2014-01-05: qty 10

## 2014-01-05 MED ORDER — ZIPRASIDONE MESYLATE 20 MG IM SOLR
20.0000 mg | Freq: Once | INTRAMUSCULAR | Status: AC
  Administered 2014-01-05: 20 mg via INTRAMUSCULAR

## 2014-01-05 MED ORDER — LORAZEPAM 1 MG PO TABS
1.0000 mg | ORAL_TABLET | Freq: Three times a day (TID) | ORAL | Status: DC | PRN
  Administered 2014-01-05: 1 mg via ORAL
  Filled 2014-01-05: qty 1

## 2014-01-05 MED ORDER — LISINOPRIL 20 MG PO TABS
20.0000 mg | ORAL_TABLET | Freq: Once | ORAL | Status: DC
  Filled 2014-01-05: qty 1

## 2014-01-05 MED ORDER — LISINOPRIL 20 MG PO TABS
20.0000 mg | ORAL_TABLET | Freq: Once | ORAL | Status: AC
  Administered 2014-01-05: 20 mg via ORAL
  Filled 2014-01-05: qty 1

## 2014-01-05 MED ORDER — HALOPERIDOL 5 MG PO TABS
10.0000 mg | ORAL_TABLET | Freq: Every day | ORAL | Status: DC
  Administered 2014-01-05 – 2014-01-06 (×2): 10 mg via ORAL
  Filled 2014-01-05 (×2): qty 2

## 2014-01-05 MED ORDER — PROPYLTHIOURACIL 50 MG PO TABS
50.0000 mg | ORAL_TABLET | Freq: Every day | ORAL | Status: DC
  Administered 2014-01-06: 50 mg via ORAL
  Filled 2014-01-05 (×3): qty 1

## 2014-01-05 NOTE — ED Notes (Signed)
Pt arguing and cursing on phone and was asked to calm down. Pt states to this nurse " you are not taking this phone away from me bitch". Pt calmed down and was allowed to speak on phone.

## 2014-01-05 NOTE — ED Notes (Addendum)
Pt refused to walk to psych ED. Pt transported on stretcher to room in psych ED

## 2014-01-05 NOTE — ED Notes (Signed)
Pt sleeping quietly at this time with even, unlabored resp. Sitter at bedside. Will continue to monitor

## 2014-01-05 NOTE — ED Notes (Signed)
Pt changed into scrubs with Carmelia, RN and is cooperative at this time. Pt talking on the phone at this time

## 2014-01-05 NOTE — ED Notes (Signed)
Pt sleeping quietly at this time with even, unlabored resp. Will continue to monitor. Sitter at bedside

## 2014-01-05 NOTE — ED Notes (Signed)
Pt requesting to call husband. Phone provided and number called for patient.

## 2014-01-05 NOTE — ED Notes (Signed)
Pt calm and resting at this time. Pt has even, unlabored resp. [employer] at bedside

## 2014-01-05 NOTE — Progress Notes (Signed)
Pt's referral has been faxed to the following facilities: [facility]- per Darel Hong can fax [facility]- per Koleen Nimrod can fax for wait list [facility]- per [facility]- per Colin Mulders 1 female bed available  The following contacted are at capacity: [facility]- per Ferne Reus- per [facility]- per Cristopher Estimable- per Lenward Chancellor- per Chaya Jan- per [facility]- per Fidela Salisbury- per [facility]- per Ronna Polio- per [facility]- per [facility] Disposition [facility]

## 2014-01-05 NOTE — ED Notes (Signed)
Pt removed restraints herself, ambulated to BR and back to room with no difficulty.

## 2014-01-05 NOTE — ED Notes (Signed)
Pt placed into violent restraints prior to this RN arrival

## 2014-01-05 NOTE — ED Notes (Addendum)
Offered patient gown however patient curses at this RN and spits eggs at me.

## 2014-01-05 NOTE — ED Notes (Signed)
Pt awake and asking to use phone. Pt provided with phone. Pt requested to go to psych ED. Dr Fayrene Fearing gave verbal to move pt

## 2014-01-05 NOTE — ED Notes (Signed)
Pt reports that she needs to urinate. Toileting offered, female urinal provided.Pt will not allow this RN and RN Coe to assist her with toileting. Pt states "get out of here bitch".  Will try back after patients calms.

## 2014-01-05 NOTE — ED Notes (Signed)
Pt now requesting a gown . Assisted patient into gown. Patient has thrown meal tray top and plastic wear in floor.

## 2014-01-05 NOTE — ED Notes (Signed)
[employer] requested to leave since pt is calm and quiet at this time. [employer] was advised ok to leave since pt is not loud or aggressive at this time

## 2014-01-05 NOTE — ED Notes (Signed)
Went in patients room to check on her, she began to take her blue scrubs off and her leg restraint. I asked for security [facility] police and some help, then I asked the patient to please lay back and put her scrubs back on. She declined and lunged at me cussing and throwing her fist. I then asked her I she would put a sheet or something over her. She would not be exposed she lunged at me again as I backed into the hallway so I would not be assaulted. Patient walked to nurses station and still continued to yell and cuss and verbally assault myself and Production designer, theatre/television/film. Patient also began to throw the phone, the computer scream and several other objects towards myself and Kennyth Arnold as Holiday representative approached the nurses station and were able to escort her back into the room and assist with restraints

## 2014-01-05 NOTE — ED Provider Notes (Signed)
38 year old female, released from jail in the last 24 hours presents from home with involuntary commitment papers from her ex-husband who states that she has been manic, speaking rationally and making abnormal sounds with extremely violent behavior. On exam the patient refuses to answer any questions, she occasionally says hyperreligious comments and his is like a snake, she has been violent with staff and thus at this point is physically restrained secondary to her violent outburst. Psychiatric consultation requested immediately upon the patient's arrival.  Medical screening examination/treatment/procedure(s) were conducted as a shared visit with non-physician practitioner(s) and myself.  I personally evaluated the patient during the encounter.        Vida Roller, MD 01/05/14 [phone]

## 2014-01-05 NOTE — ED Notes (Signed)
Requested that pt remove gown and change into scrubs. Pt placed scrubs over gown. Pt was asked to remove gown and leave just scrubs on. Pt refused and threw scrubs onto floor and then slammed door to room. Pt eating lunch at this time

## 2014-01-05 NOTE — ED Notes (Signed)
Pt threw breakfast tray on floor. Pt resting quietly in bed at this time

## 2014-01-05 NOTE — ED Provider Notes (Signed)
CSN: [id number]     Arrival date & time 01/05/14  4451 History   First MD Initiated Contact with Patient 01/05/14 0618     No chief complaint on file.    (Consider location/radiation/quality/duration/timing/severity/associated sxs/prior Treatment) HPI Comments: Patient presents to the ED with a chief complaint of psychotic break.  Reportedly, she was just released from jail.  This morning she was found to be manic, yelling, hissing like a snake, throwing things at the police.  No reported SI or HI.  Takes haldol daily.  History of bipolar. Level 5 caveat applies 2/2 to psychiatric disorder.  IVC.  The history is provided by the EMS personnel and the police. No language interpreter was used.    Past Medical History  Diagnosis Date  . Sinus tachycardia   . HTN (hypertension)   . Arm pain   . Eye globe prosthesis   . CHF (congestive heart failure)   . Bipolar affective disorder, currently manic, mild   . Hyperthyroidism    Past Surgical History  Procedure Laterality Date  . Eye surgery    . Dilation and curettage of uterus     Family History  Problem Relation Age of Onset  . Hypertension Other   . Emphysema Other   . Asthma Son   . Diabetes Maternal Uncle   . Diabetes Paternal Grandmother    History  Substance Use Topics  . Smoking status: Current Every Day Smoker -- 1.00 packs/day    Types: Cigarettes    Last Attempt to Quit: 08/21/2013  . Smokeless tobacco: Never Used  . Alcohol Use: No   OB History   Grav Para Term Preterm Abortions TAB SAB Ect Mult Living   3 2 2  1  0 1   2     Review of Systems  Unable to perform ROS: Psychiatric disorder      Allergies  Amoxil; Trazodone and nefazodone; Ketorolac tromethamine; Prenatal; and Tegretol  Home Medications   Prior to Admission medications   Medication Sig Start Date End Date Taking? Authorizing Provider  haloperidol (HALDOL) 5 MG tablet Take 10 mg by mouth at bedtime and may repeat dose one time if needed.     Historical Provider, MD  ibuprofen (ADVIL,MOTRIN) 600 MG tablet Take 1 tablet (600 mg total) by mouth 4 (four) times daily. 09/04/13   [name], MD  lisinopril (PRINIVIL,ZESTRIL) 20 MG tablet Take 20 mg by mouth daily.    Historical Provider, MD  medroxyPROGESTERone (PROVERA) 10 MG tablet Take 1 tablet (10 mg total) by mouth daily. 11/27/13   Freddi Starr, PA-C  propylthiouracil (PTU) 50 MG tablet Take 1 tablet (50 mg total) by mouth daily. 07/09/13   Minta Balsam, MD   BP 108/75  Pulse 107  Resp 15  SpO2 98% Physical Exam  Nursing note and vitals reviewed. Constitutional: She is oriented to person, place, and time. She appears well-developed and well-nourished.  Restrained with soft restraints, resting comfortably on bed  HENT:  Head: Normocephalic and atraumatic.  Eyes: Conjunctivae and EOM are normal. Pupils are equal, round, and reactive to light.  Neck: Normal range of motion. Neck supple.  Cardiovascular: Normal rate and regular rhythm.  Exam reveals no gallop and no friction rub.   No murmur heard. Pulmonary/Chest: Effort normal and breath sounds normal. No respiratory distress. She has no wheezes. She has no rales. She exhibits no tenderness.  Abdominal: Soft. Bowel sounds are normal. She exhibits no distension and no mass. There is  no tenderness. There is no rebound and no guarding.  No focal abdominal tenderness, no RLQ tenderness or pain at McBurney's point, no RUQ tenderness or Murphy's sign, no left-sided abdominal tenderness, no fluid wave, or signs of peritonitis   Musculoskeletal: Normal range of motion. She exhibits no edema and no tenderness.  Neurological: She is alert and oriented to person, place, and time.  Skin: Skin is warm and dry.  Psychiatric: She has a normal mood and affect. Her behavior is normal. Judgment and thought content normal.    ED Course  Procedures (including critical care time) Results for orders placed during the hospital encounter of  01/05/14  CBC WITH DIFFERENTIAL      Result Value Ref Range   WBC 10.6 (*) 4.0 - 10.5 K/uL   RBC 5.07  3.87 - 5.11 MIL/uL   Hemoglobin 14.4  12.0 - 15.0 g/dL   HCT 40.142.9  02.736.0 - 25.346.0 %   MCV 84.6  78.0 - 100.0 fL   MCH 28.4  26.0 - 34.0 pg   MCHC 33.6  30.0 - 36.0 g/dL   RDW 66.413.6  40.311.5 - 47.415.5 %   Platelets 248  150 - 400 K/uL   Neutrophils Relative % 55  43 - 77 %   Neutro Abs 5.9  1.7 - 7.7 K/uL   Lymphocytes Relative 30  12 - 46 %   Lymphs Abs 3.1  0.7 - 4.0 K/uL   Monocytes Relative 13 (*) 3 - 12 %   Monocytes Absolute 1.4 (*) 0.1 - 1.0 K/uL   Eosinophils Relative 1  0 - 5 %   Eosinophils Absolute 0.1  0.0 - 0.7 K/uL   Basophils Relative 1  0 - 1 %   Basophils Absolute 0.1  0.0 - 0.1 K/uL  COMPREHENSIVE METABOLIC PANEL      Result Value Ref Range   Sodium 141  137 - 147 mEq/L   Potassium 4.8  3.7 - 5.3 mEq/L   Chloride 105  96 - 112 mEq/L   CO2 21  19 - 32 mEq/L   Glucose, Bld 82  70 - 99 mg/dL   BUN 20  6 - 23 mg/dL   Creatinine, Ser 2.591.47 (*) 0.50 - 1.10 mg/dL   Calcium 9.4  8.4 - 56.310.5 mg/dL   Total Protein 6.4  6.0 - 8.3 g/dL   Albumin 3.9  3.5 - 5.2 g/dL   AST 33  0 - 37 U/L   ALT 27  0 - 35 U/L   Alkaline Phosphatase 50  39 - 117 U/L   Total Bilirubin 1.2  0.3 - 1.2 mg/dL   GFR calc non Af Amer 45 (*) >90 mL/min   GFR calc Af Amer 52 (*) >90 mL/min   Anion gap 15  5 - 15  SALICYLATE LEVEL      Result Value Ref Range   Salicylate Lvl <2.0 (*) 2.8 - 20.0 mg/dL  ACETAMINOPHEN LEVEL      Result Value Ref Range   Acetaminophen (Tylenol), Serum <15.0  10 - 30 ug/mL  ETHANOL      Result Value Ref Range   Alcohol, Ethyl (B) <11  0 - 11 mg/dL      MDM   Final diagnoses:  Psychosis, unspecified psychosis type    Patient psychotic break.  Hissing like a snake.  Hyper-religious. No reported SI/HI.  Violent outbursts.    Roxy Horsemanobert Rollie Hynek, PA-C 01/05/14 1551

## 2014-01-05 NOTE — ED Provider Notes (Signed)
Medical screening examination/treatment/procedure(s) were conducted as a shared visit with non-physician practitioner(s) and myself.  I personally evaluated the patient during the encounter  Please see my separate respective documentation pertaining to this patient encounter   Vida Roller, MD 01/05/14 Barry Brunner

## 2014-01-05 NOTE — ED Provider Notes (Signed)
BMP initially showed Cr. 1.7. Was hydrated. Repeat showed Cr. 1.1. Dispo pending psych.   Richardean Canal, MD 01/05/14 [phone]

## 2014-01-05 NOTE — Consult Note (Signed)
Pt was interviewed with NP. Agree with above assessment and plan.  Aliviah Spain, MD 

## 2014-01-05 NOTE — ED Notes (Signed)
Bed: [facility] Expected date:  Expected time:  Means of arrival:  Comments: Hold for Room 25

## 2014-01-05 NOTE — ED Notes (Signed)
Pt verbally aggressive and using words and phrases that do not make sense. Pt agitated by the presence of staff. [employer] and security at bedside

## 2014-01-05 NOTE — ED Notes (Signed)
37 yr female, called from home.  Police department was at patients home for 1 hour prior to EMS being notified.  Exhusband was filing IVC paperwork with PD.  Pt was manic at home, throwing heavy objects at PD, EMS, and exhusband and kicking, hissing, and spitting.  5 mg haldol given IM to patient.  EMS notified that pt takes 5 mg haldol daily.  Pt was released from jail today.  Unsure if pt received medication while in prison.  Vital stable.  HR 110-120.  CBG 91.  Barrie Lyme RN 7:04 AM 01/05/2014

## 2014-01-05 NOTE — Consult Note (Signed)
[facility] Face-to-Face Psychiatry Consult   Reason for Consult:  Psychosis  Referring Physician:  EDP  Christine Cobb is an 38 y.o. female. Total Time spent with patient: 15 minutes  Assessment: AXIS I:  Bipolar disorder AXIS II:  Deferred AXIS III:   Past Medical History  Diagnosis Date  . Sinus tachycardia   . HTN (hypertension)   . Arm pain   . Eye globe prosthesis   . CHF (congestive heart failure)   . Bipolar affective disorder, currently manic, mild   . Hyperthyroidism    AXIS IV:  other psychosocial or environmental problems, problems related to social environment and problems with primary support group AXIS V:  21-30 behavior considerably influenced by delusions or hallucinations OR serious impairment in judgment, communication OR inability to function in almost all areas  Plan:  Recommend psychiatric Inpatient admission when medically cleared.  Dr. Aneta Mins reviewed the patient and concurs with the plan.  Subjective:   Christine Cobb is a 38 y.o. female patient admitted with psychosis.  HPI:  Patient was sent to the ED for being aggressive at home towards her family.  She delivered her baby in March.  Since her pregnancy last summer, she has had frequent visits to the ED for psychosis with no prior mention of psychosis until her pregnancy.  When she got to the ED, she was throwing food at staff and was given Geodon and Ativan.  She remains irritable and refuses to talk to this practitioner, stated it was a waste of time for them to send me in her room.   HPI Elements:   Location:  generalized. Quality:  acute. Severity:  severe. Timing:  constant. Duration:  last summer. Context:  stressors, pregnancy.  Past Psychiatric History: Past Medical History  Diagnosis Date  . Sinus tachycardia   . HTN (hypertension)   . Arm pain   . Eye globe prosthesis   . CHF (congestive heart failure)   . Bipolar affective disorder, currently manic, mild   . Hyperthyroidism     reports that she has been smoking Cigarettes.  She has been smoking about 1.00 pack per day. She has never used smokeless tobacco. She reports that she uses illicit drugs (Marijuana). She reports that she does not drink alcohol. Family History  Problem Relation Age of Onset  . Hypertension Other   . Emphysema Other   . Asthma Son   . Diabetes Maternal Uncle   . Diabetes Paternal Grandmother            Allergies:   Allergies  Allergen Reactions  . Amoxil [Amoxicillin] Anaphylaxis  . Trazodone And Nefazodone Anaphylaxis  . Ketorolac Tromethamine Hives and Swelling  . Prenatal [B-Plex Plus] Nausea Only  . Tegretol [Carbamazepine] Other (See Comments)    Swelling, skin peeling, skin discoloration    ACT Assessment Complete:  Yes:    Educational Status    Risk to Self: Risk to self Substance abuse history and/or treatment for substance abuse?:  (UTA)  Risk to Others:    Abuse:    Prior Inpatient Therapy:    Prior Outpatient Therapy:    Additional Information:                    Objective: Blood pressure 90/56, pulse 94, temperature 98.5 F (36.9 C), temperature source Oral, resp. rate 15, SpO2 100.00%, not currently breastfeeding.There is no weight on file to calculate BMI. Results for orders placed during the hospital encounter of 01/05/14 (from the  past 72 hour(s))  CBC WITH DIFFERENTIAL     Status: Abnormal   Collection Time    01/05/14  6:52 AM      Result Value Ref Range   WBC 10.6 (*) 4.0 - 10.5 K/uL   RBC 5.07  3.87 - 5.11 MIL/uL   Hemoglobin 14.4  12.0 - 15.0 g/dL   HCT 42.9  36.0 - 46.0 %   MCV 84.6  78.0 - 100.0 fL   MCH 28.4  26.0 - 34.0 pg   MCHC 33.6  30.0 - 36.0 g/dL   RDW 13.6  11.5 - 15.5 %   Platelets 248  150 - 400 K/uL   Neutrophils Relative % 55  43 - 77 %   Neutro Abs 5.9  1.7 - 7.7 K/uL   Lymphocytes Relative 30  12 - 46 %   Lymphs Abs 3.1  0.7 - 4.0 K/uL   Monocytes Relative 13 (*) 3 - 12 %   Monocytes Absolute 1.4 (*) 0.1 - 1.0  K/uL   Eosinophils Relative 1  0 - 5 %   Eosinophils Absolute 0.1  0.0 - 0.7 K/uL   Basophils Relative 1  0 - 1 %   Basophils Absolute 0.1  0.0 - 0.1 K/uL  COMPREHENSIVE METABOLIC PANEL     Status: Abnormal   Collection Time    01/05/14  6:52 AM      Result Value Ref Range   Sodium 141  137 - 147 mEq/L   Potassium 4.8  3.7 - 5.3 mEq/L   Chloride 105  96 - 112 mEq/L   CO2 21  19 - 32 mEq/L   Glucose, Bld 82  70 - 99 mg/dL   BUN 20  6 - 23 mg/dL   Creatinine, Ser 1.47 (*) 0.50 - 1.10 mg/dL   Calcium 9.4  8.4 - 10.5 mg/dL   Total Protein 6.4  6.0 - 8.3 g/dL   Albumin 3.9  3.5 - 5.2 g/dL   AST 33  0 - 37 U/L   ALT 27  0 - 35 U/L   Alkaline Phosphatase 50  39 - 117 U/L   Total Bilirubin 1.2  0.3 - 1.2 mg/dL   GFR calc non Af Amer 45 (*) >90 mL/min   GFR calc Af Amer 52 (*) >90 mL/min   Comment: (NOTE)     The eGFR has been calculated using the CKD EPI equation.     This calculation has not been validated in all clinical situations.     eGFR's persistently <90 mL/min signify possible Chronic Kidney     Disease.   Anion gap 15  5 - 15  SALICYLATE LEVEL     Status: Abnormal   Collection Time    01/05/14  6:52 AM      Result Value Ref Range   Salicylate Lvl <9.4 (*) 2.8 - 20.0 mg/dL  ACETAMINOPHEN LEVEL     Status: None   Collection Time    01/05/14  6:52 AM      Result Value Ref Range   Acetaminophen (Tylenol), Serum <15.0  10 - 30 ug/mL   Comment:            THERAPEUTIC CONCENTRATIONS VARY     SIGNIFICANTLY. A RANGE OF 10-30     ug/mL MAY BE AN EFFECTIVE     CONCENTRATION FOR MANY PATIENTS.     HOWEVER, SOME ARE BEST TREATED     AT CONCENTRATIONS OUTSIDE THIS     RANGE.  ACETAMINOPHEN CONCENTRATIONS     >150 ug/mL AT 4 HOURS AFTER     INGESTION AND >50 ug/mL AT 12     HOURS AFTER INGESTION ARE     OFTEN ASSOCIATED WITH TOXIC     REACTIONS.  ETHANOL     Status: None   Collection Time    01/05/14  6:52 AM      Result Value Ref Range   Alcohol, Ethyl (B) <11  0 -  11 mg/dL   Comment:            LOWEST DETECTABLE LIMIT FOR     SERUM ALCOHOL IS 11 mg/dL     FOR MEDICAL PURPOSES ONLY   Labs are reviewed and are pertinent for no medical issues noted.  Current Facility-Administered Medications  Medication Dose Route Frequency Provider Last Rate Last Dose  . LORazepam (ATIVAN) tablet 1 mg  1 mg Oral Q8H PRN Montine Circle, PA-C       Current Outpatient Prescriptions  Medication Sig Dispense Refill  . haloperidol (HALDOL) 5 MG tablet Take 10 mg by mouth at bedtime and may repeat dose one time if needed.      Marland Kitchen ibuprofen (ADVIL,MOTRIN) 600 MG tablet Take 1 tablet (600 mg total) by mouth 4 (four) times daily.  30 tablet  0  . lisinopril (PRINIVIL,ZESTRIL) 20 MG tablet Take 20 mg by mouth daily.      . medroxyPROGESTERone (PROVERA) 10 MG tablet Take 1 tablet (10 mg total) by mouth daily.  30 tablet  0  . propylthiouracil (PTU) 50 MG tablet Take 1 tablet (50 mg total) by mouth daily.  60 tablet  1    Psychiatric Specialty Exam:     Blood pressure 90/56, pulse 94, temperature 98.5 F (36.9 C), temperature source Oral, resp. rate 15, SpO2 100.00%, not currently breastfeeding.There is no weight on file to calculate BMI.  General Appearance: Disheveled  Eye Contact::  Minimal  Speech:  Pressured  Volume:  Increased  Mood:  Angry, Anxious and Irritable  Affect:  Congruent  Thought Process:  Disorganized  Orientation:  Full (Time, Place, and Person)  Thought Content:  Hallucinations: Auditory and Paranoid Ideation  Suicidal Thoughts:  No  Homicidal Thoughts:  No  Memory:  Immediate;   Poor Recent;   Poor Remote;   Poor  Judgement:  Impaired  Insight:  Lacking  Psychomotor Activity:  Increased  Concentration:  Poor  Recall:  Poor  Fund of Knowledge: Fair  Language: Fair  Akathisia:  No  Handed:  Right  AIMS (if indicated):     Assets:  Housing Leisure Time Physical Health Resilience  Sleep:      Musculoskeletal: Strength & Muscle Tone:  within normal limits Gait & Station: normal Patient leans: N/A  Treatment Plan Summary: Daily contact with patient to assess and evaluate symptoms and progress in treatment Medication management; admit to inpatient psychiatry for stabilization  Waylan Boga, PMH-NP 01/05/2014 3:05 PM

## 2014-01-05 NOTE — ED Notes (Signed)
Will obtain urine when pt wakes up and is calm

## 2014-01-05 NOTE — ED Notes (Signed)
RN Joni Reining gave patient breakfast tray. Left arm restraint removed for patient to eat.

## 2014-01-06 ENCOUNTER — Encounter (HOSPITAL_COMMUNITY): Payer: Self-pay | Admitting: Registered Nurse

## 2014-01-06 DIAGNOSIS — F29 Unspecified psychosis not due to a substance or known physiological condition: Secondary | ICD-10-CM | POA: Diagnosis not present

## 2014-01-06 DIAGNOSIS — F319 Bipolar disorder, unspecified: Secondary | ICD-10-CM | POA: Diagnosis not present

## 2014-01-06 LAB — PREGNANCY, URINE: Preg Test, Ur: NEGATIVE

## 2014-01-06 LAB — T4, FREE: Free T4: 1.81 ng/dL — ABNORMAL HIGH (ref 0.80–1.80)

## 2014-01-06 MED ORDER — LISINOPRIL 20 MG PO TABS
20.0000 mg | ORAL_TABLET | Freq: Every day | ORAL | Status: DC
  Administered 2014-01-06 – 2014-01-07 (×2): 20 mg via ORAL
  Filled 2014-01-06 (×2): qty 1

## 2014-01-06 MED ORDER — TRAMADOL HCL 50 MG PO TABS
50.0000 mg | ORAL_TABLET | Freq: Once | ORAL | Status: AC
  Administered 2014-01-06: 50 mg via ORAL
  Filled 2014-01-06: qty 1

## 2014-01-06 MED ORDER — HALOPERIDOL 5 MG PO TABS: 10.0000 mg | ORAL_TABLET | Freq: Every evening | ORAL | Status: DC | PRN

## 2014-01-06 MED ORDER — ZOLPIDEM TARTRATE 5 MG PO TABS: 5.0000 mg | ORAL_TABLET | Freq: Every evening | ORAL | Status: DC | PRN

## 2014-01-06 MED ORDER — NICOTINE POLACRILEX 2 MG MT GUM
2.0000 mg | CHEWING_GUM | OROMUCOSAL | Status: DC | PRN
  Administered 2014-01-06 – 2014-01-07 (×3): 2 mg via ORAL
  Filled 2014-01-06 (×3): qty 1

## 2014-01-06 MED ORDER — ACETAMINOPHEN 325 MG PO TABS
650.0000 mg | ORAL_TABLET | Freq: Once | ORAL | Status: AC
  Administered 2014-01-06: 650 mg via ORAL
  Filled 2014-01-06: qty 2

## 2014-01-06 MED ORDER — MEDROXYPROGESTERONE ACETATE 10 MG PO TABS
10.0000 mg | ORAL_TABLET | Freq: Every day | ORAL | Status: DC
  Administered 2014-01-06: 10 mg via ORAL
  Filled 2014-01-06 (×2): qty 1

## 2014-01-06 NOTE — Progress Notes (Signed)
Per Britta Mccreedy, RN at [facility], pt has been added to [facility] waitlist as a priority.   Blain Pais, MHT/NS

## 2014-01-06 NOTE — ED Notes (Addendum)
Writer attempted to change patient's soiled sheets. Patient became angry yelling, "Why did you go in my room. Don't take my sheets. I am fine the way they are." A clean set of sheets were placed in the room as well.

## 2014-01-06 NOTE — Progress Notes (Signed)
MHT initiated bed placement at the following psychiatric facilities:  1)[facility]-faxed referral 2)[facility]-declined due to aggressive behaviors 3)[facility]-declined due to aggressive behaviors 4)[facility]-under review 5)[facility]-no beds 6)[facility]-no beds 7)[facility]-faxed referral 8)[facility]- no beds 9)[facility]-no beds 10)[facility]-faxed referral 11)[facility]-no beds 12)[facility]-faxed referral; [facility]#[id number] good thru 01/13/14  Blain Pais, MHT/NS

## 2014-01-06 NOTE — Consult Note (Signed)
Pt was interviewed with NP. Chart reviewed. Agree with above assessment and plan.  Ancil Linsey, MD

## 2014-01-06 NOTE — Progress Notes (Signed)
Follow-up calls have been made to the following facilities: Turner Daniels- no answer; message left and no returned phone call [facility]- per Olegario Messier at [facility]- per Sue Lush has not been run by attending, will call TTS back once reviewed [facility]- per per Tammy was not received [facility]- at capacity [facility]- per [facility] at capacity  The following facilities have been contacted regarding bed availability and are at capacity: [facility]- per Zadie Rhine- per Troy Sine- per Pilar Jarvis- per Lenward Chancellor- per [facility] Smiles- per [facility]- per Elmore Guise- per [facility]- per Robinette Haines at capacity [place]- per [facility]- per [facility]- per Marisue Ivan [facility]- per Dannielle Huh  **pt has been declined at [facility], [facility] and [facility]    [facility] Disposition MHT

## 2014-01-06 NOTE — Consult Note (Signed)
[facility] Follow Up Psychiatry Consult   Reason for Consult:  Psychosis  Referring Physician:  EDP  Christine Cobb is an 38 y.o. female. Total Time spent with patient: 15 minutes  Assessment: AXIS I:  Bipolar disorder AXIS II:  Deferred AXIS III:   Past Medical History  Diagnosis Date  . Sinus tachycardia   . HTN (hypertension)   . Arm pain   . Eye globe prosthesis   . CHF (congestive heart failure)   . Bipolar affective disorder, currently manic, mild   . Hyperthyroidism    AXIS IV:  other psychosocial or environmental problems, problems related to social environment and problems with primary support group AXIS V:  21-30 behavior considerably influenced by delusions or hallucinations OR serious impairment in judgment, communication OR inability to function in almost all areas  Plan:  Recommend psychiatric Inpatient admission when medically cleared.  Dr. Aneta Mins reviewed the patient and concurs with the plan.  Subjective:   Christine Cobb is a 39 y.o. female patient admitted with psychosis.  HPI:  Patient continues to be disorganized thoughts.  Pressured speech.  "I was at home waiting on my husband; the cadillac was missing.  Went to doctor to get my medicine; got mad cause I could get my medicine; I busted out his windows. They got mad at me; My husband said he was going to take out papers on me."  Patient states that she is ready to go home. At this time patient denies suicidal/homicidal ideation, psychosis, and paranoia.  HPI Elements:   Location:  generalized. Quality:  acute. Severity:  severe. Timing:  constant. Duration:  last summer. Context:  stressors, pregnancy.  Past Psychiatric History: Past Medical History  Diagnosis Date  . Sinus tachycardia   . HTN (hypertension)   . Arm pain   . Eye globe prosthesis   . CHF (congestive heart failure)   . Bipolar affective disorder, currently manic, mild   . Hyperthyroidism     reports that she has been smoking  Cigarettes.  She has been smoking about 1.00 pack per day. She has never used smokeless tobacco. She reports that she uses illicit drugs (Marijuana). She reports that she does not drink alcohol. Family History  Problem Relation Age of Onset  . Hypertension Other   . Emphysema Other   . Asthma Son   . Diabetes Maternal Uncle   . Diabetes Paternal Grandmother            Allergies:   Allergies  Allergen Reactions  . Amoxil [Amoxicillin] Anaphylaxis  . Trazodone And Nefazodone Anaphylaxis  . Ketorolac Tromethamine Hives and Swelling  . Prenatal [B-Plex Plus] Nausea Only  . Tegretol [Carbamazepine] Other (See Comments)    Swelling, skin peeling, skin discoloration    ACT Assessment Complete:  Yes:    Educational Status    Risk to Self: Risk to self Substance abuse history and/or treatment for substance abuse?: No  Risk to Others:    Abuse:    Prior Inpatient Therapy:    Prior Outpatient Therapy:    Additional Information:       Objective: Blood pressure 108/74, pulse 65, temperature 97.9 F (36.6 C), temperature source Oral, resp. rate 18, SpO2 97.00%, not currently breastfeeding.There is no weight on file to calculate BMI. Results for orders placed during the hospital encounter of 01/05/14 (from the past 72 hour(s))  CBC WITH DIFFERENTIAL     Status: Abnormal   Collection Time    01/05/14  6:52 AM  Result Value Ref Range   WBC 10.6 (*) 4.0 - 10.5 K/uL   RBC 5.07  3.87 - 5.11 MIL/uL   Hemoglobin 14.4  12.0 - 15.0 g/dL   HCT 42.9  36.0 - 46.0 %   MCV 84.6  78.0 - 100.0 fL   MCH 28.4  26.0 - 34.0 pg   MCHC 33.6  30.0 - 36.0 g/dL   RDW 13.6  11.5 - 15.5 %   Platelets 248  150 - 400 K/uL   Neutrophils Relative % 55  43 - 77 %   Neutro Abs 5.9  1.7 - 7.7 K/uL   Lymphocytes Relative 30  12 - 46 %   Lymphs Abs 3.1  0.7 - 4.0 K/uL   Monocytes Relative 13 (*) 3 - 12 %   Monocytes Absolute 1.4 (*) 0.1 - 1.0 K/uL   Eosinophils Relative 1  0 - 5 %   Eosinophils Absolute  0.1  0.0 - 0.7 K/uL   Basophils Relative 1  0 - 1 %   Basophils Absolute 0.1  0.0 - 0.1 K/uL  COMPREHENSIVE METABOLIC PANEL     Status: Abnormal   Collection Time    01/05/14  6:52 AM      Result Value Ref Range   Sodium 141  137 - 147 mEq/L   Potassium 4.8  3.7 - 5.3 mEq/L   Chloride 105  96 - 112 mEq/L   CO2 21  19 - 32 mEq/L   Glucose, Bld 82  70 - 99 mg/dL   BUN 20  6 - 23 mg/dL   Creatinine, Ser 1.47 (*) 0.50 - 1.10 mg/dL   Calcium 9.4  8.4 - 10.5 mg/dL   Total Protein 6.4  6.0 - 8.3 g/dL   Albumin 3.9  3.5 - 5.2 g/dL   AST 33  0 - 37 U/L   ALT 27  0 - 35 U/L   Alkaline Phosphatase 50  39 - 117 U/L   Total Bilirubin 1.2  0.3 - 1.2 mg/dL   GFR calc non Af Amer 45 (*) >90 mL/min   GFR calc Af Amer 52 (*) >90 mL/min   Comment: (NOTE)     The eGFR has been calculated using the CKD EPI equation.     This calculation has not been validated in all clinical situations.     eGFR's persistently <90 mL/min signify possible Chronic Kidney     Disease.   Anion gap 15  5 - 15  SALICYLATE LEVEL     Status: Abnormal   Collection Time    01/05/14  6:52 AM      Result Value Ref Range   Salicylate Lvl <5.3 (*) 2.8 - 20.0 mg/dL  ACETAMINOPHEN LEVEL     Status: None   Collection Time    01/05/14  6:52 AM      Result Value Ref Range   Acetaminophen (Tylenol), Serum <15.0  10 - 30 ug/mL   Comment:            THERAPEUTIC CONCENTRATIONS VARY     SIGNIFICANTLY. A RANGE OF 10-30     ug/mL MAY BE AN EFFECTIVE     CONCENTRATION FOR MANY PATIENTS.     HOWEVER, SOME ARE BEST TREATED     AT CONCENTRATIONS OUTSIDE THIS     RANGE.     ACETAMINOPHEN CONCENTRATIONS     >150 ug/mL AT 4 HOURS AFTER     INGESTION AND >50 ug/mL AT 12  HOURS AFTER INGESTION ARE     OFTEN ASSOCIATED WITH TOXIC     REACTIONS.  ETHANOL     Status: None   Collection Time    01/05/14  6:52 AM      Result Value Ref Range   Alcohol, Ethyl (B) <11  0 - 11 mg/dL   Comment:            LOWEST DETECTABLE LIMIT FOR      SERUM ALCOHOL IS 11 mg/dL     FOR MEDICAL PURPOSES ONLY  TSH     Status: None   Collection Time    01/05/14  6:45 PM      Result Value Ref Range   TSH 0.913  0.350 - 4.500 uIU/mL   Comment: Performed at [facility]  T4, FREE     Status: Abnormal   Collection Time    01/05/14  6:46 PM      Result Value Ref Range   Free T4 1.81 (*) 0.80 - 1.80 ng/dL   Comment: Performed at [place]     Status: Abnormal   Collection Time    01/05/14  6:46 PM      Result Value Ref Range   Sodium 139  137 - 147 mEq/L   Potassium 4.0  3.7 - 5.3 mEq/L   Chloride 106  96 - 112 mEq/L   CO2 21  19 - 32 mEq/L   Glucose, Bld 116 (*) 70 - 99 mg/dL   BUN 13  6 - 23 mg/dL   Creatinine, Ser 1.19 (*) 0.50 - 1.10 mg/dL   Calcium 8.9  8.4 - 10.5 mg/dL   GFR calc non Af Amer 58 (*) >90 mL/min   GFR calc Af Amer 68 (*) >90 mL/min   Comment: (NOTE)     The eGFR has been calculated using the CKD EPI equation.     This calculation has not been validated in all clinical situations.     eGFR's persistently <90 mL/min signify possible Chronic Kidney     Disease.   Anion gap 12  5 - 15  URINE RAPID DRUG SCREEN (HOSP PERFORMED)     Status: None   Collection Time    01/05/14  7:57 PM      Result Value Ref Range   Opiates NONE DETECTED  NONE DETECTED   Cocaine NONE DETECTED  NONE DETECTED   Benzodiazepines NONE DETECTED  NONE DETECTED   Amphetamines NONE DETECTED  NONE DETECTED   Tetrahydrocannabinol NONE DETECTED  NONE DETECTED   Barbiturates NONE DETECTED  NONE DETECTED   Comment:            DRUG SCREEN FOR MEDICAL PURPOSES     ONLY.  IF CONFIRMATION IS NEEDED     FOR ANY PURPOSE, NOTIFY LAB     WITHIN 5 DAYS.                LOWEST DETECTABLE LIMITS     FOR URINE DRUG SCREEN     Drug Class       Cutoff (ng/mL)     Amphetamine      1000     Barbiturate      200     Benzodiazepine   096     Tricyclics       045     Opiates          300     Cocaine  300      THC              50   Labs are reviewed and are pertinent for no medical issues noted.  Current Facility-Administered Medications  Medication Dose Route Frequency Provider Last Rate Last Dose  . ALPRAZolam Duanne Moron) tablet 0.5 mg  0.5 mg Oral Q6H PRN Wandra Arthurs, MD   0.5 mg at 01/06/14 0941  . haloperidol (HALDOL) tablet 10 mg  10 mg Oral QHS Waylan Boga, NP   10 mg at 01/05/14 2157  . LORazepam (ATIVAN) tablet 1 mg  1 mg Oral Q8H PRN Montine Circle, PA-C   1 mg at 01/05/14 2157  . propylthiouracil (PTU) tablet 50 mg  50 mg Oral QPC breakfast Waylan Boga, NP   50 mg at 01/06/14 1914   Current Outpatient Prescriptions  Medication Sig Dispense Refill  . ALPRAZolam (XANAX) 0.5 MG tablet Take 0.5 mg by mouth at bedtime as needed for anxiety (anxiety).      . haloperidol (HALDOL) 5 MG tablet Take 10 mg by mouth at bedtime and may repeat dose one time if needed.      Marland Kitchen ibuprofen (ADVIL,MOTRIN) 600 MG tablet Take 1 tablet (600 mg total) by mouth 4 (four) times daily.  30 tablet  0  . lisinopril (PRINIVIL,ZESTRIL) 20 MG tablet Take 20 mg by mouth daily.      . medroxyPROGESTERone (PROVERA) 10 MG tablet Take 1 tablet (10 mg total) by mouth daily.  30 tablet  0  . propylthiouracil (PTU) 50 MG tablet Take 1 tablet (50 mg total) by mouth daily.  60 tablet  1    Psychiatric Specialty Exam:     Blood pressure 108/74, pulse 65, temperature 97.9 F (36.6 C), temperature source Oral, resp. rate 18, SpO2 97.00%, not currently breastfeeding.There is no weight on file to calculate BMI.  General Appearance: Disheveled  Eye Sport and exercise psychologist::  Fair  Speech:  Pressured  Volume:  Increased  Mood:  Angry, Anxious and Irritable  Affect:  Congruent  Thought Process:  Disorganized  Orientation:  Full (Time, Place, and Person)  Thought Content:  Hallucinations: Auditory and Paranoid Ideation  Suicidal Thoughts:  No  Homicidal Thoughts:  No  Memory:  Immediate;   Fair Recent;   Fair Remote;   Fair  Judgement:   Impaired  Insight:  Lacking  Psychomotor Activity:  Increased  Concentration:  Poor  Recall:  Poor  Fund of Knowledge: Fair  Language: Fair  Akathisia:  No  Handed:  Right  AIMS (if indicated):     Assets:  Housing Leisure Time Physical Health Resilience  Sleep:      Musculoskeletal: Strength & Muscle Tone: within normal limits Gait & Station: normal Patient leans: N/A  Treatment Plan Summary: Daily contact with patient to assess and evaluate symptoms and progress in treatment Medication management; admit to inpatient psychiatry for stabilization  Will continue with current treatment plan for inpatient treatment.  Continue to monitor for safety and stabilization until inpatient treatment bed is found .    Earleen Newport, FNP-BC  01/06/2014 12:54 PM

## 2014-01-06 NOTE — ED Notes (Signed)
Pt calm, cooperative, will continue to monitor for safety.

## 2014-01-06 NOTE — ED Notes (Signed)
Very polite, cooperative. Speech no longer pressured. No longer hostile.

## 2014-01-07 ENCOUNTER — Encounter (HOSPITAL_COMMUNITY): Payer: Self-pay | Admitting: Registered Nurse

## 2014-01-07 DIAGNOSIS — F29 Unspecified psychosis not due to a substance or known physiological condition: Secondary | ICD-10-CM | POA: Diagnosis not present

## 2014-01-07 DIAGNOSIS — F319 Bipolar disorder, unspecified: Secondary | ICD-10-CM | POA: Diagnosis not present

## 2014-01-07 NOTE — BH Assessment (Signed)
Faxed referral information to [facility] as they report they did not receive any information regarding patient.  Glorious Peach, MS, LCASA Assessment Counselor

## 2014-01-07 NOTE — BH Assessment (Signed)
Per Junious Dresser Admitting nurse at [facility] @0852  patient is still on waitlist.   Glorious Peach, MS, LCASA Assessment Counselor

## 2014-01-07 NOTE — BH Assessment (Addendum)
Per Tacey Ruiz at [facility] hospital-capacity. Per Tacey Ruiz prior referral information regarding patient was never received.  Glorious Peach, MS, LCASA Assessment Counselor

## 2014-01-07 NOTE — Consult Note (Signed)
[facility] Follow Up Psychiatry Consult   Reason for Consult:  Psychosis  Referring Physician:  EDP  Christine Cobb is an 38 y.o. female. Total Time spent with patient: 15 minutes  Assessment: AXIS I:  Bipolar disorder AXIS II:  Deferred AXIS III:   Past Medical History  Diagnosis Date  . Sinus tachycardia   . HTN (hypertension)   . Arm pain   . Eye globe prosthesis   . CHF (congestive heart failure)   . Bipolar affective disorder, currently manic, mild   . Hyperthyroidism    AXIS IV:  other psychosocial or environmental problems, problems related to social environment and problems with primary support group AXIS V:  21-30 behavior considerably influenced by delusions or hallucinations OR serious impairment in judgment, communication OR inability to function in almost all areas  Plan:  Follow up with outpatient provider  Dr. Adele Schilder reviewed the patient and concurs with the plan.  Subjective:   Christine Cobb is a 39 y.o. female patient admitted with psychosis.  HPI:  Patient appears to be more organized today.  Patient story or why she is here in the hospital is contestant to yesterday.  Patient speech is less pressured today.  Patient states.  I am doing wonderful.  My husband and I got into a confrontation at the doctors office when went down to get refills on our medication.  I got mad and busted the window out of my car; it is my car.  The doctor called the police and the police arrested me.  When I got out of jail he has changed the locks and I had no way to get in.  The I was served papers and brought here."  Patient then begins to read the IVC papers and states that she has not been action violent.  Since she has been in the hospital she has had a friend to get her things from the apartment.  Patient states that she has been compliant with her medications; and goes to [facility] for outpatient services.  Patient denies suicidal/homicidal ideation, psychosis, and paranoia.       HPI Elements:   Location:  generalized. Quality:  acute. Severity:  mild. Timing:  constant. Duration:  last summer. Context:  stressors, pregnancy.  Past Psychiatric History: Past Medical History  Diagnosis Date  . Sinus tachycardia   . HTN (hypertension)   . Arm pain   . Eye globe prosthesis   . CHF (congestive heart failure)   . Bipolar affective disorder, currently manic, mild   . Hyperthyroidism     reports that she has been smoking Cigarettes.  She has been smoking about 1.00 pack per day. She has never used smokeless tobacco. She reports that she uses illicit drugs (Marijuana). She reports that she does not drink alcohol. Family History  Problem Relation Age of Onset  . Hypertension Other   . Emphysema Other   . Asthma Son   . Diabetes Maternal Uncle   . Diabetes Paternal Grandmother            Allergies:   Allergies  Allergen Reactions  . Amoxil [Amoxicillin] Anaphylaxis  . Trazodone And Nefazodone Anaphylaxis  . Ketorolac Tromethamine Hives and Swelling  . Prenatal [B-Plex Plus] Nausea Only  . Tegretol [Carbamazepine] Other (See Comments)    Swelling, skin peeling, skin discoloration    ACT Assessment Complete:  Yes:    Educational Status    Risk to Self: Risk to self Substance abuse history and/or  treatment for substance abuse?: No  Risk to Others:    Abuse:    Prior Inpatient Therapy:    Prior Outpatient Therapy:    Additional Information:       Objective: Blood pressure 116/72, pulse 60, temperature 98.2 F (36.8 C), temperature source Oral, resp. rate 16, SpO2 98.00%, not currently breastfeeding.There is no weight on file to calculate BMI. Results for orders placed during the hospital encounter of 01/05/14 (from the past 72 hour(s))  CBC WITH DIFFERENTIAL     Status: Abnormal   Collection Time    01/05/14  6:52 AM      Result Value Ref Range   WBC 10.6 (*) 4.0 - 10.5 K/uL   RBC 5.07  3.87 - 5.11 MIL/uL   Hemoglobin 14.4  12.0 - 15.0  g/dL   HCT 42.9  36.0 - 46.0 %   MCV 84.6  78.0 - 100.0 fL   MCH 28.4  26.0 - 34.0 pg   MCHC 33.6  30.0 - 36.0 g/dL   RDW 13.6  11.5 - 15.5 %   Platelets 248  150 - 400 K/uL   Neutrophils Relative % 55  43 - 77 %   Neutro Abs 5.9  1.7 - 7.7 K/uL   Lymphocytes Relative 30  12 - 46 %   Lymphs Abs 3.1  0.7 - 4.0 K/uL   Monocytes Relative 13 (*) 3 - 12 %   Monocytes Absolute 1.4 (*) 0.1 - 1.0 K/uL   Eosinophils Relative 1  0 - 5 %   Eosinophils Absolute 0.1  0.0 - 0.7 K/uL   Basophils Relative 1  0 - 1 %   Basophils Absolute 0.1  0.0 - 0.1 K/uL  COMPREHENSIVE METABOLIC PANEL     Status: Abnormal   Collection Time    01/05/14  6:52 AM      Result Value Ref Range   Sodium 141  137 - 147 mEq/L   Potassium 4.8  3.7 - 5.3 mEq/L   Chloride 105  96 - 112 mEq/L   CO2 21  19 - 32 mEq/L   Glucose, Bld 82  70 - 99 mg/dL   BUN 20  6 - 23 mg/dL   Creatinine, Ser 1.47 (*) 0.50 - 1.10 mg/dL   Calcium 9.4  8.4 - 10.5 mg/dL   Total Protein 6.4  6.0 - 8.3 g/dL   Albumin 3.9  3.5 - 5.2 g/dL   AST 33  0 - 37 U/L   ALT 27  0 - 35 U/L   Alkaline Phosphatase 50  39 - 117 U/L   Total Bilirubin 1.2  0.3 - 1.2 mg/dL   GFR calc non Af Amer 45 (*) >90 mL/min   GFR calc Af Amer 52 (*) >90 mL/min   Comment: (NOTE)     The eGFR has been calculated using the CKD EPI equation.     This calculation has not been validated in all clinical situations.     eGFR's persistently <90 mL/min signify possible Chronic Kidney     Disease.   Anion gap 15  5 - 15  SALICYLATE LEVEL     Status: Abnormal   Collection Time    01/05/14  6:52 AM      Result Value Ref Range   Salicylate Lvl <6.1 (*) 2.8 - 20.0 mg/dL  ACETAMINOPHEN LEVEL     Status: None   Collection Time    01/05/14  6:52 AM      Result Value  Ref Range   Acetaminophen (Tylenol), Serum <15.0  10 - 30 ug/mL   Comment:            THERAPEUTIC CONCENTRATIONS VARY     SIGNIFICANTLY. A RANGE OF 10-30     ug/mL MAY BE AN EFFECTIVE     CONCENTRATION FOR MANY  PATIENTS.     HOWEVER, SOME ARE BEST TREATED     AT CONCENTRATIONS OUTSIDE THIS     RANGE.     ACETAMINOPHEN CONCENTRATIONS     >150 ug/mL AT 4 HOURS AFTER     INGESTION AND >50 ug/mL AT 12     HOURS AFTER INGESTION ARE     OFTEN ASSOCIATED WITH TOXIC     REACTIONS.  ETHANOL     Status: None   Collection Time    01/05/14  6:52 AM      Result Value Ref Range   Alcohol, Ethyl (B) <11  0 - 11 mg/dL   Comment:            LOWEST DETECTABLE LIMIT FOR     SERUM ALCOHOL IS 11 mg/dL     FOR MEDICAL PURPOSES ONLY  TSH     Status: None   Collection Time    01/05/14  6:45 PM      Result Value Ref Range   TSH 0.913  0.350 - 4.500 uIU/mL   Comment: Performed at [facility]  T4, FREE     Status: Abnormal   Collection Time    01/05/14  6:46 PM      Result Value Ref Range   Free T4 1.81 (*) 0.80 - 1.80 ng/dL   Comment: Performed at [place]     Status: Abnormal   Collection Time    01/05/14  6:46 PM      Result Value Ref Range   Sodium 139  137 - 147 mEq/L   Potassium 4.0  3.7 - 5.3 mEq/L   Chloride 106  96 - 112 mEq/L   CO2 21  19 - 32 mEq/L   Glucose, Bld 116 (*) 70 - 99 mg/dL   BUN 13  6 - 23 mg/dL   Creatinine, Ser 1.19 (*) 0.50 - 1.10 mg/dL   Calcium 8.9  8.4 - 10.5 mg/dL   GFR calc non Af Amer 58 (*) >90 mL/min   GFR calc Af Amer 68 (*) >90 mL/min   Comment: (NOTE)     The eGFR has been calculated using the CKD EPI equation.     This calculation has not been validated in all clinical situations.     eGFR's persistently <90 mL/min signify possible Chronic Kidney     Disease.   Anion gap 12  5 - 15  URINE RAPID DRUG SCREEN (HOSP PERFORMED)     Status: None   Collection Time    01/05/14  7:57 PM      Result Value Ref Range   Opiates NONE DETECTED  NONE DETECTED   Cocaine NONE DETECTED  NONE DETECTED   Benzodiazepines NONE DETECTED  NONE DETECTED   Amphetamines NONE DETECTED  NONE DETECTED   Tetrahydrocannabinol NONE DETECTED   NONE DETECTED   Barbiturates NONE DETECTED  NONE DETECTED   Comment:            DRUG SCREEN FOR MEDICAL PURPOSES     ONLY.  IF CONFIRMATION IS NEEDED     FOR ANY PURPOSE, NOTIFY LAB     WITHIN  5 DAYS.                LOWEST DETECTABLE LIMITS     FOR URINE DRUG SCREEN     Drug Class       Cutoff (ng/mL)     Amphetamine      1000     Barbiturate      200     Benzodiazepine   846     Tricyclics       659     Opiates          300     Cocaine          300     THC              50  PREGNANCY, URINE     Status: None   Collection Time    01/06/14  8:05 PM      Result Value Ref Range   Preg Test, Ur NEGATIVE  NEGATIVE   Comment:            THE SENSITIVITY OF THIS     METHODOLOGY IS >20 mIU/mL.   Labs are reviewed and are pertinent for no medical issues noted.  Current Facility-Administered Medications  Medication Dose Route Frequency Provider Last Rate Last Dose  . ALPRAZolam Duanne Moron) tablet 0.5 mg  0.5 mg Oral Q6H PRN Wandra Arthurs, MD   0.5 mg at 01/07/14 0401  . haloperidol (HALDOL) tablet 10 mg  10 mg Oral QHS Waylan Boga, NP   10 mg at 01/06/14 2119  . haloperidol (HALDOL) tablet 10 mg  10 mg Oral QHS,MR X 1 Shuvon Rankin, NP      . lisinopril (PRINIVIL,ZESTRIL) tablet 20 mg  20 mg Oral Daily Shuvon Rankin, NP   20 mg at 01/07/14 0814  . LORazepam (ATIVAN) tablet 1 mg  1 mg Oral Q8H PRN Montine Circle, PA-C   1 mg at 01/05/14 2157  . medroxyPROGESTERone (PROVERA) tablet 10 mg  10 mg Oral Daily Shuvon Rankin, NP   10 mg at 01/06/14 1354  . nicotine polacrilex (NICORETTE) gum 2 mg  2 mg Oral PRN Shuvon Rankin, NP   2 mg at 01/07/14 0814  . propylthiouracil (PTU) tablet 50 mg  50 mg Oral QPC breakfast Waylan Boga, NP   50 mg at 01/06/14 0941  . zolpidem (AMBIEN) tablet 5 mg  5 mg Oral QHS PRN Shuvon Rankin, NP       Current Outpatient Prescriptions  Medication Sig Dispense Refill  . ALPRAZolam (XANAX) 0.5 MG tablet Take 0.5 mg by mouth at bedtime as needed for anxiety (anxiety).       . haloperidol (HALDOL) 5 MG tablet Take 10 mg by mouth at bedtime and may repeat dose one time if needed.      Marland Kitchen ibuprofen (ADVIL,MOTRIN) 600 MG tablet Take 1 tablet (600 mg total) by mouth 4 (four) times daily.  30 tablet  0  . lisinopril (PRINIVIL,ZESTRIL) 20 MG tablet Take 20 mg by mouth daily.      . medroxyPROGESTERone (PROVERA) 10 MG tablet Take 1 tablet (10 mg total) by mouth daily.  30 tablet  0  . propylthiouracil (PTU) 50 MG tablet Take 1 tablet (50 mg total) by mouth daily.  60 tablet  1    Psychiatric Specialty Exam:     Blood pressure 116/72, pulse 60, temperature 98.2 F (36.8 C), temperature source Oral, resp. rate 16, SpO2 98.00%, not currently breastfeeding.There is no weight  on file to calculate BMI.  General Appearance: Casual and Fairly Groomed  Eye Contact::  Good  Speech:  Normal Rate and Pressured  Volume:  Normal  Mood:  Anxious  Affect:  Congruent  Thought Process:  Disorganized  Orientation:  Full (Time, Place, and Person)  Thought Content:  Patient denies auditory/visual hallucinations  Suicidal Thoughts:  No  Homicidal Thoughts:  No  Memory:  Immediate;   Good Recent;   Good Remote;   Good  Judgement:  Fair  Insight:  Lacking  Psychomotor Activity:  Normal  Concentration:  Fair  Recall:  [employer] of Knowledge: Fair  Language: Fair  Akathisia:  No  Handed:  Right  AIMS (if indicated):     Assets:  Housing Leisure Time Physical Health Resilience  Sleep:      Musculoskeletal: Strength & Muscle Tone: within normal limits Gait & Station: normal Patient leans: N/A  Treatment Plan Summary: Discharge home.  Patient to follow up with Nicholaus Bloom, Shuvon, FNP-BC  01/07/2014 2:50 PM  I have personally seen the patient and agreed with the findings and involved in the treatment plan. Berniece Andreas, MD

## 2014-01-07 NOTE — Discharge Instructions (Signed)
Confusion °Confusion is the inability to think with your usual speed or clarity. Confusion may come on quickly or slowly over time. How quickly the confusion comes on depends on the cause. Confusion can be due to any number of causes. °CAUSES  °· Concussion, head injury, or head trauma. °· Seizures. °· Stroke. °· Fever. °· Senility. °· Heightened emotional states like rage or terror. °· Mental illness in which the person loses the ability to determine what is real and what is not (hallucinations). °· Infections. °· Toxic effects from alcohol, drugs, or prescription medicines. °· Dehydration and an imbalance of salts in the body (electrolytes). °· Lack of sleep. °· Low blood sugar (diabetes). °· Low levels of oxygen (for example from chronic lung disorders). °· Drug interactions or other medication side effects. °· Nutritional deficiencies, especially niacin, thiamine, vitamin C, or vitamin B. °· Sudden drop in body temperature (hypothermia). °· Illness in the elderly. Constipation can result in confusion. An elderly person who is hospitalized may become confused due to change in daily routine. °SYMPTOMS  °People often describe their thinking as cloudy or unclear when they are confused. Confusion can also include feeling disoriented. That means you are unaware of where or who you are. You may also not know what the date or time is. If confused, you may also have difficulty paying attention, remembering and making decisions. Some people also act aggressively when they are confused.  °DIAGNOSIS  °The medical evaluation of confusion may include: °· Blood and urine tests. °· X-rays. °· Brain and nervous system tests. °· Analyzing your brain waves (electroencphalogram or EEG). °· A special X-ray (MRI) of your head or other special studies. °Your physician will ask questions such as: °· Do you get days and nights mixed up? °· Are you awake during regular sleep times? °· Do you have trouble recognizing people? °· Do you  know where you are? °· Do you know the date and time? °· Does the confusion come and go? °· Is the confusion quickly getting worse? °· Has there been a recent illness? °· Has there been a recent head injury? °· Are you diabetic? °· Do you have a lung disorder? °· What medication are you taking? °· Have you taken drugs or alcohol? °TREATMENT  °An admission to the hospital may not be needed, but a confused person should not be left alone. Stay with a family member or friend until the confusion clears. Avoid alcohol, pain relievers or sedative drugs until you have fully recovered. Do not drive until your caregiver says it is okay. °HOME CARE INSTRUCTIONS °What family and friends can do: °· To find out if someone is confused ask him or her their name, age, and the date. If the person is unsure or answers incorrectly, he or she is confused. °· Always introduce yourself, no matter how well the person knows you. °· Often remind the person of his or her location. °· Place a calendar and clock near the confused person. °· Talk about current events and plans for the day. °· Try to keep the environment calm, quiet and peaceful. °· Make sure the patient keeps follow up appointments with their physician. °PREVENTION  °Ways to prevent confusion: °· Avoid alcohol. °· Eat a balanced diet. °· Get enough sleep. °· Do not become isolated. Spend time with other people and make plans for your days. °· Keep careful watch on your blood sugar levels if you are diabetic. °SEEK IMMEDIATE MEDICAL CARE IF:  °· You develop   severe headaches, repeated vomiting, seizures, blackouts or slurred speech.  There is increasing confusion, weakness, numbness, restlessness or personality changes.  You develop a loss of balance, have marked dizziness, feel uncoordinated or fall.  You have delusions, hallucinations or develop severe anxiety.  Your family members think you need to be rechecked. Document Released: 07/22/2004 Document Revised:  09/06/2011 Document Reviewed: 03/19/2008 [facility] Patient Information 2015 [place], [place]. This information is not intended to replace advice given to you by your health care provider. Make sure you discuss any questions you have with your health care provider.  Psychosis Psychosis refers to a severe lack of understanding with reality. During a psychotic episode, you are not able to think clearly. During a psychotic episode, your responses and emotions are inappropriate and do not coincide with what is actually happening. You often have false beliefs about what is happening or who you are (delusions), and you may see, hear, taste, smell, or feel things that are not present (hallucinations). Psychosis is usually a severe symptom of a very serious mental health (psychiatric) condition, but it can sometimes be the result of a medical condition. CAUSES   Psychiatric conditions, such as:  Schizophrenia.  Bipolar disorder.  Depression.  Personality disorders.  Alcohol or drug abuse.  Medical conditions, such as:  Brain injury.  Brain tumor.  Dementia.  Brain diseases, such as Alzheimer's, Parkinson's, or Huntington's disease.  Neurological diseases, such as epilepsy.  Genetic disorders.  Metabolic disorders.  Infections that affect the brain.  Certain prescription drugs.  Stroke. SYMPTOMS   Unable to think or speak clearly or respond appropriately.  Disorganized thinking (thoughts jump from one thought to another).  Severe inappropriate behavior.  Delusions may include:  A strong belief that is odd, unrealistic, or false.  Feeling extremely fearful or suspicious (paranoid).  Believing you are someone else, have high importance, or have an altered identity.  Hallucinations. DIAGNOSIS   Mental health evaluation.  Physical exam.  Blood tests.  Computerized magnetic scan (MRI) or other brain scans. TREATMENT  Your caregiver will recommend a course of  treatment that depends on the cause of the psychosis. Treatment may include:  Monitoring and supportive care in the hospital.  Taking medicines (antipsychotic medicine) to reduce symptoms and balance chemicals in the brain.  Taking medicines to manage underlying mental health conditions.  Therapy and other supportive programs outside of the hospital.  Treating an underlying medical condition. If the cause of the psychosis can be treated or corrected, the outlook is good. Without treatment, psychotic episodes can cause danger to yourself or others. Treatment may be short-term or lifelong. HOME CARE INSTRUCTIONS   Take all medicines as directed. This is important.  Use a pillbox or write down your medicine schedule to make sure you are taking them.  Check with your caregiver before using over-the-counter medicines, herbs, or supplements.  Seek individual and family support through therapy and mental health education (psychoeducation) programs. These will help you manage symptoms and side effects of medicines, learn life skills, and maintain a healthy routine.  Maintain a healthy lifestyle.  Exercise regularly.  Avoid alcohol and drugs.  Learn ways to reduce stress and cope with stress, such as yoga and meditation.  Talk about your feelings with family members or caregivers.  Make time for yourself to do things you enjoy.  Know the early warning signs of psychosis. Your caregiver will recommend steps to take when you notice symptoms such as:  Feeling anxious or preoccupied.  Having racing  thoughts.  Changes in your interest in life and relationships.  Follow up with your caregivers for continued outpatient treatment as directed. SEEK MEDICAL CARE IF:   Medicines do not seem to be helping.  You hear voices telling you to do things.  You see, smell, or feel things that are not there.  You feel hopeless and overwhelmed.  You feel extremely fearful and suspicious that  something will harm you.  You feel like you cannot leave your house.  You have trouble taking care of yourself.  You experience side effects of medicines, such as changes in sleep patterns, dizziness, weight gain, restlessness, movement changes, muscle spasms, or tremors. SEEK IMMEDIATE MEDICAL CARE IF:  Severe psychotic symptoms present a safety issue (such as an urge to hurt yourself or others). MAKE SURE YOU:   Understand these instructions.  Will watch your condition.  Will get help right away if you are not doing well or get worse. FOR MORE INFORMATION  National Institute of Mental Health: http://www.maynard.net/ Document Released: 12/02/2009 Document Revised: 09/06/2011 Document Reviewed: 12/02/2009 [facility] Patient Information 2015 [place], [place]. This information is not intended to replace advice given to you by your health care provider. Make sure you discuss any questions you have with your health care provider.

## 2014-01-07 NOTE — BHH Counselor (Addendum)
Shelly at [facility] - pt has been declined d/t having no beds. They aren't expecting any discharges today and don't keep referrals.  Evette Cristal, [place] Assessment Counselor 11:58  Per Junious Dresser at [facility], pt still on wait list.  Evette Cristal, [place] Assessment Counselor

## 2014-01-07 NOTE — BH Assessment (Addendum)
Follow-up calls have been made to the following facilities on 01/07/14:  [facility]-F/U call made by TTS. TTS left a message on intake coordinator telephone line.  [facility]- per [facility] beds available referral information faxed. Spoke with [employer] who reports that she will have to call TTS back as she is unable to confirm receipt of referral information at this time   [facility]- Per Nancy @capacity .  [facility]- Per [facility] @capacity .  [facility]- Per Dana @capacity .  [facility]- Per [email or web address]@[email or web address] .   [employer], MS, LCASA Assessment Counselor

## 2014-01-07 NOTE — BHH Suicide Risk Assessment (Cosign Needed)
Suicide Risk Assessment  Discharge Assessment     Demographic Factors:  Black female  Total Time spent with patient: 30 minutes Psychiatric Specialty Exam:      Blood pressure 116/72, pulse 60, temperature 98.2 F (36.8 C), temperature source Oral, resp. rate 16, SpO2 98.00%, not currently breastfeeding.There is no weight on file to calculate BMI.   General Appearance: Casual and Fairly Groomed   Eye Contact:: Good   Speech: Normal Rate and Pressured   Volume: Normal   Mood: Anxious   Affect: Congruent   Thought Process: Disorganized   Orientation: Full (Time, Place, and Person)   Thought Content: Patient denies auditory/visual hallucinations   Suicidal Thoughts: No   Homicidal Thoughts: No   Memory: Immediate; Good  Recent; Good  Remote; Good   Judgement: Fair   Insight: Lacking   Psychomotor Activity: Normal   Concentration: Fair   Recall: [employer] of Knowledge: Fair   Language: Fair   Akathisia: No   Handed: Right   AIMS (if indicated):   Assets: Housing  Leisure Time  Physical Health  Resilience   Sleep:   Musculoskeletal:  Strength & Muscle Tone: within normal limits  Gait & Station: normal  Patient leans: N/A    Mental Status Per Nursing Assessment::   On Admission:     Current Mental Status by Physician: Patient denies suicidal/homicidal ideation, psychosis, and paranoia  Loss Factors: NA  Historical Factors: NA  Risk Reduction Factors:   Responsible for children under 89 years of age, Sense of responsibility to family, Positive social support and Positive therapeutic relationship  Continued Clinical Symptoms:  Previous Psychiatric Diagnoses and Treatments  Cognitive Features That Contribute To Risk:  None noted    Suicide Risk:  Minimal: No identifiable suicidal ideation.  Patients presenting with no risk factors but with morbid ruminations; may be classified as minimal risk based on the severity of the depressive symptoms  Discharge  Diagnoses:  AXIS I: Bipolar disorder  AXIS II: Deferred  AXIS III:  Past Medical History   Diagnosis  Date   .  Sinus tachycardia    .  HTN (hypertension)    .  Arm pain    .  Eye globe prosthesis    .  CHF (congestive heart failure)    .  Bipolar affective disorder, currently manic, mild    .  Hyperthyroidism     AXIS IV: other psychosocial or environmental problems, problems related to social environment and problems with primary support group  AXIS V: 21-30 behavior considerably influenced by delusions or hallucinations OR serious impairment in judgment, communication OR inability to function in almost all areas   Plan Of Care/Follow-up recommendations:  Activity:  Resume usual activity Diet:  Resume usual diet  Is patient on multiple antipsychotic therapies at discharge:  No   Has Patient had three or more failed trials of antipsychotic monotherapy by history:  No  Recommended Plan for Multiple Antipsychotic Therapies: NA  Rankin, Shuvon, FNP-BC 01/07/2014, 3:05 PM

## 2014-01-07 NOTE — BH Assessment (Signed)
Notified Renee Pain Admitting Nurse at [facility] that pt can be removed from wait list.  Per Shuvon Rankin,NP pt d/c from ED with the recommendation to follow-up with [facility].   Glorious Peach, MS, LCASA Assessment Counselor

## 2014-01-15 ENCOUNTER — Emergency Department (HOSPITAL_COMMUNITY)
Admission: EM | Admit: 2014-01-15 | Discharge: 2014-01-15 | Disposition: A | Discharge status: Home / Self Care | Payer: Medicare Other | Attending: Emergency Medicine | Admitting: Emergency Medicine

## 2014-01-15 ENCOUNTER — Encounter (HOSPITAL_COMMUNITY): Payer: Self-pay | Admitting: Emergency Medicine

## 2014-01-15 DIAGNOSIS — E0590 Thyrotoxicosis, unspecified without thyrotoxic crisis or storm: Secondary | ICD-10-CM | POA: Insufficient documentation

## 2014-01-15 DIAGNOSIS — Z79899 Other long term (current) drug therapy: Secondary | ICD-10-CM | POA: Insufficient documentation

## 2014-01-15 DIAGNOSIS — M542 Cervicalgia: Secondary | ICD-10-CM | POA: Insufficient documentation

## 2014-01-15 DIAGNOSIS — F17200 Nicotine dependence, unspecified, uncomplicated: Secondary | ICD-10-CM | POA: Insufficient documentation

## 2014-01-15 DIAGNOSIS — F3111 Bipolar disorder, current episode manic without psychotic features, mild: Secondary | ICD-10-CM | POA: Insufficient documentation

## 2014-01-15 DIAGNOSIS — I10 Essential (primary) hypertension: Secondary | ICD-10-CM | POA: Diagnosis not present

## 2014-01-15 DIAGNOSIS — I509 Heart failure, unspecified: Secondary | ICD-10-CM | POA: Diagnosis not present

## 2014-01-15 DIAGNOSIS — M549 Dorsalgia, unspecified: Secondary | ICD-10-CM | POA: Diagnosis present

## 2014-01-15 DIAGNOSIS — Z791 Long term (current) use of non-steroidal anti-inflammatories (NSAID): Secondary | ICD-10-CM | POA: Diagnosis not present

## 2014-01-15 MED ORDER — FUROSEMIDE 20 MG PO TABS: 20.0000 mg | ORAL_TABLET | Freq: Every day | ORAL | Status: DC

## 2014-01-15 MED ORDER — OXYCODONE-ACETAMINOPHEN 5-325 MG PO TABS: 1.0000 | ORAL_TABLET | ORAL | Status: DC | PRN

## 2014-01-15 MED ORDER — ALPRAZOLAM 1 MG PO TABS: 1.0000 mg | ORAL_TABLET | Freq: Every evening | ORAL | Status: DC | PRN

## 2014-01-15 NOTE — Discharge Instructions (Signed)
You will need to see your doctor for ongoing refills of your medicines.
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
## 2014-01-22 ENCOUNTER — Emergency Department (HOSPITAL_COMMUNITY)
Admission: EM | Admit: 2014-01-22 | Discharge: 2014-01-22 | Discharge status: Against Medical Advice | Payer: Medicare Other | Attending: Emergency Medicine | Admitting: Emergency Medicine

## 2014-01-22 DIAGNOSIS — I509 Heart failure, unspecified: Secondary | ICD-10-CM | POA: Insufficient documentation

## 2014-01-22 DIAGNOSIS — Z043 Encounter for examination and observation following other accident: Secondary | ICD-10-CM | POA: Insufficient documentation

## 2014-01-22 DIAGNOSIS — I10 Essential (primary) hypertension: Secondary | ICD-10-CM | POA: Diagnosis not present

## 2014-01-22 DIAGNOSIS — F17200 Nicotine dependence, unspecified, uncomplicated: Secondary | ICD-10-CM | POA: Diagnosis not present

## 2014-01-22 NOTE — ED Notes (Signed)
Social work called to talk to the pt
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
## 2014-02-22 DIAGNOSIS — F259 Schizoaffective disorder, unspecified: Secondary | ICD-10-CM | POA: Diagnosis not present

## 2014-03-15 ENCOUNTER — Emergency Department (HOSPITAL_COMMUNITY)
Admission: EM | Admit: 2014-03-15 | Discharge: 2014-03-18 | Disposition: A | Discharge status: Other Acute Inpatient Hospital | Payer: Medicare Other | Attending: Emergency Medicine | Admitting: Emergency Medicine

## 2014-03-15 ENCOUNTER — Encounter (HOSPITAL_COMMUNITY): Payer: Self-pay | Admitting: Emergency Medicine

## 2014-03-15 DIAGNOSIS — I509 Heart failure, unspecified: Secondary | ICD-10-CM | POA: Diagnosis not present

## 2014-03-15 DIAGNOSIS — F3010 Manic episode without psychotic symptoms, unspecified: Secondary | ICD-10-CM

## 2014-03-15 DIAGNOSIS — F17200 Nicotine dependence, unspecified, uncomplicated: Secondary | ICD-10-CM | POA: Diagnosis not present

## 2014-03-15 DIAGNOSIS — I10 Essential (primary) hypertension: Secondary | ICD-10-CM | POA: Diagnosis not present

## 2014-03-15 DIAGNOSIS — F309 Manic episode, unspecified: Secondary | ICD-10-CM | POA: Diagnosis not present

## 2014-03-15 DIAGNOSIS — F3110 Bipolar disorder, current episode manic without psychotic features, unspecified: Secondary | ICD-10-CM | POA: Diagnosis present

## 2014-03-15 DIAGNOSIS — R440 Auditory hallucinations: Secondary | ICD-10-CM

## 2014-03-15 DIAGNOSIS — F319 Bipolar disorder, unspecified: Secondary | ICD-10-CM

## 2014-03-15 LAB — COMPREHENSIVE METABOLIC PANEL
ALBUMIN: 3.4 g/dL — AB (ref 3.5–5.2)
ALT: 13 U/L (ref 0–35)
AST: 18 U/L (ref 0–37)
Alkaline Phosphatase: 57 U/L (ref 39–117)
Anion gap: 10 (ref 5–15)
BILIRUBIN TOTAL: 0.8 mg/dL (ref 0.3–1.2)
BUN: 8 mg/dL (ref 6–23)
CO2: 24 mEq/L (ref 19–32)
CREATININE: 1 mg/dL (ref 0.50–1.10)
Calcium: 9.1 mg/dL (ref 8.4–10.5)
Chloride: 108 mEq/L (ref 96–112)
GFR calc Af Amer: 84 mL/min — ABNORMAL LOW (ref >90)
GFR calc non Af Amer: 72 mL/min — ABNORMAL LOW (ref >90)
Glucose, Bld: 99 mg/dL (ref 70–99)
Potassium: 3.8 mEq/L (ref 3.7–5.3)
Sodium: 142 mEq/L (ref 137–147)
Total Protein: 6.3 g/dL (ref 6.0–8.3)

## 2014-03-15 LAB — SALICYLATE LEVEL

## 2014-03-15 LAB — CBC
HCT: 38.1 % (ref 36.0–46.0)
Hemoglobin: 12.9 g/dL (ref 12.0–15.0)
MCH: 29.6 pg (ref 26.0–34.0)
MCHC: 33.9 g/dL (ref 30.0–36.0)
MCV: 87.4 fL (ref 78.0–100.0)
PLATELETS: 232 10*3/uL (ref 150–400)
RBC: 4.36 MIL/uL (ref 3.87–5.11)
RDW: 14 % (ref 11.5–15.5)
WBC: 7 10*3/uL (ref 4.0–10.5)

## 2014-03-15 LAB — ETHANOL

## 2014-03-15 LAB — ACETAMINOPHEN LEVEL

## 2014-03-15 MED ORDER — ALPRAZOLAM 1 MG PO TABS
1.0000 mg | ORAL_TABLET | Freq: Every evening | ORAL | Status: DC | PRN
  Filled 2014-03-15: qty 1
  Filled 2014-03-15: qty 2
  Filled 2014-03-15: qty 1

## 2014-03-15 MED ORDER — MEDROXYPROGESTERONE ACETATE 10 MG PO TABS
10.0000 mg | ORAL_TABLET | Freq: Every day | ORAL | Status: DC
  Administered 2014-03-17 – 2014-03-18 (×2): 10 mg via ORAL
  Filled 2014-03-15 (×5): qty 1

## 2014-03-15 MED ORDER — LISINOPRIL 20 MG PO TABS
20.0000 mg | ORAL_TABLET | Freq: Every day | ORAL | Status: DC
  Administered 2014-03-15: 20 mg via ORAL
  Filled 2014-03-15 (×4): qty 1

## 2014-03-15 MED ORDER — FUROSEMIDE 20 MG PO TABS
20.0000 mg | ORAL_TABLET | Freq: Every day | ORAL | Status: DC
  Filled 2014-03-15 (×4): qty 1

## 2014-03-15 MED ORDER — ZIPRASIDONE MESYLATE 20 MG IM SOLR
20.0000 mg | Freq: Four times a day (QID) | INTRAMUSCULAR | Status: DC | PRN
  Administered 2014-03-15 – 2014-03-16 (×2): 20 mg via INTRAMUSCULAR
  Filled 2014-03-15 (×3): qty 20

## 2014-03-15 MED ORDER — HALOPERIDOL 5 MG PO TABS: 5.0000 mg | ORAL_TABLET | Freq: Every evening | ORAL | Status: DC | PRN

## 2014-03-15 MED ORDER — TRAMADOL HCL 50 MG PO TABS: 100.0000 mg | ORAL_TABLET | Freq: Four times a day (QID) | ORAL | Status: DC | PRN

## 2014-03-15 MED ORDER — PROPYLTHIOURACIL 50 MG PO TABS
50.0000 mg | ORAL_TABLET | Freq: Every day | ORAL | Status: DC
  Administered 2014-03-15 – 2014-03-18 (×4): 50 mg via ORAL
  Filled 2014-03-15 (×5): qty 1

## 2014-03-15 MED ORDER — STERILE WATER FOR INJECTION IJ SOLN
INTRAMUSCULAR | Status: AC
  Filled 2014-03-15: qty 10

## 2014-03-15 MED ORDER — ZIPRASIDONE MESYLATE 20 MG IM SOLR
20.0000 mg | Freq: Once | INTRAMUSCULAR | Status: AC
  Administered 2014-03-15: 20 mg via INTRAMUSCULAR
  Filled 2014-03-15: qty 20

## 2014-03-15 NOTE — ED Notes (Addendum)
Pt resting in bed. Able to obtain vitals. Pt arousable, but lethargic.

## 2014-03-15 NOTE — ED Notes (Signed)
Pt standing at the [employer] on phone call.

## 2014-03-15 NOTE — BHH Counselor (Signed)
Spoke to patient MD and nurse on POD B. Patient presented very manic and was provided Geodon. Now is sleeping and unable to participate in assessment. TTS to follow up in 2 hours to attempt to reassess patient.

## 2014-03-15 NOTE — ED Notes (Addendum)
Went the room to talk with pt.was manic ,cursing out staff.officer barnes &offier griffin was in the room trying to calm the patient.down.dr.lockwood was there . Misty also tried to  Talk with pt. . Reasoning with pt.dr.lockwood talk with pt.gave her the injection.geodon.she is a sleep.family member is coming.  Allergies  Allergen Reactions  . Amoxil [Amoxicillin] Anaphylaxis  . Trazodone And Nefazodone Anaphylaxis  . Ketorolac Tromethamine Hives and Swelling  . Prenatal [B-Plex Plus] Nausea Only  . Tegretol [Carbamazepine] Other (See Comments)    Swelling, skin peeling, skin discoloration   so
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
## 2014-03-15 NOTE — ED Notes (Signed)
MD at bedside. Zavitz

## 2014-03-15 NOTE — ED Notes (Addendum)
Pt aroused and alert. This RN attempted to introduce myself and assess pt. TTS then came on the screen for [facility]. Pt belligerent and uncooperative. States "I am bipolar ma'am and pregnant and need to go to [place]. I am not staying here. I want my breakfast so I can go."
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
## 2014-03-15 NOTE — ED Notes (Signed)
Charge RN notified of Pt.s activity.

## 2014-03-15 NOTE — ED Notes (Signed)
Pt states her sister is on her way and she will leave and go to [place]. MD requesting for pt to allow [place] to treat her and draw labs, let our psych MD evaluate her. Pt refusing.

## 2014-03-15 NOTE — ED Notes (Signed)
[employer] at bedside awaiting pt to come out of bathroom

## 2014-03-15 NOTE — ED Notes (Addendum)
Pt states "just give me percocet and morphine. Then I can get over to mental health- I've been dealing with them for 8 years." Pt refusing to get in scrubs and have vitals taken. Pt states she is [redacted] weeks pregnant with twins, but refusing to be examined. MD Jeraldine Loots is at bedside speaking with pt. Gave pt two warm blankets
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
## 2014-03-15 NOTE — BH Assessment (Signed)
Attempted 2x to assess patient per nursing staff is still sleeping soundly. Will attempt TTS assessment later in shift.

## 2014-03-15 NOTE — ED Provider Notes (Signed)
CSN: [id number]     Arrival date & time 03/15/14  5749 History   First MD Initiated Contact with Patient 03/15/14 [phone]     Chief Complaint  Patient presents with  . Medical Clearance  . Manic Behavior      HPI  The patient presents, via [employer], do to unclear reasons. The patient has frank exhibition of mania, yelling, providing a variable history of why she is here, though it seems as though she requested transfer for psychiatric evaluation. Per police, the patient was reporting recent history of crack use, insomnia, agitation, and medication noncompliance. The patient herself is aggressive, threatening, noncompliant, not agreeable with requests to cooperate with evaluation to assist her in any way possible. She gives a relevant account of tangential history, without consistent details. Level V caveat secondary psychiatric disorder  Past Medical History  Diagnosis Date  . Sinus tachycardia   . HTN (hypertension)   . Arm pain   . Eye globe prosthesis   . CHF (congestive heart failure)   . Bipolar affective disorder, currently manic, mild   . Hyperthyroidism    Past Surgical History  Procedure Laterality Date  . Eye surgery    . Dilation and curettage of uterus     Family History  Problem Relation Age of Onset  . Hypertension Other   . Emphysema Other   . Asthma Son   . Diabetes Maternal Uncle   . Diabetes Paternal Grandmother    History  Substance Use Topics  . Smoking status: Current Every Day Smoker -- 1.00 packs/day    Types: Cigarettes    Last Attempt to Quit: 08/21/2013  . Smokeless tobacco: Never Used  . Alcohol Use: No   OB History   Grav Para Term Preterm Abortions TAB SAB Ect Mult Living   3 2 2  1  0 1   2     Review of Systems  Unable to perform ROS: Psychiatric disorder      Allergies  Amoxil; Trazodone and nefazodone; Ketorolac tromethamine; Prenatal; and Tegretol  Home Medications   Prior to Admission medications   Medication Sig Start Date  End Date Taking? Authorizing Provider  ALPRAZolam Prudy Feeler) 0.5 MG tablet Take 0.5 mg by mouth at bedtime as needed for anxiety (anxiety).    Historical Provider, MD  ALPRAZolam Prudy Feeler) 1 MG tablet Take 1 tablet (1 mg total) by mouth at bedtime as needed for anxiety. 01/15/14   Vida Roller, MD  furosemide (LASIX) 20 MG tablet Take 1 tablet (20 mg total) by mouth daily. 01/15/14   Vida Roller, MD  haloperidol (HALDOL) 5 MG tablet Take 5 mg by mouth at bedtime as needed and may repeat dose one time if needed for agitation.    Historical Provider, MD  ibuprofen (ADVIL,MOTRIN) 600 MG tablet Take 1 tablet (600 mg total) by mouth 4 (four) times daily. 09/04/13   Jamal Collin, MD  lisinopril (PRINIVIL,ZESTRIL) 20 MG tablet Take 20 mg by mouth daily.    Historical Provider, MD  medroxyPROGESTERone (PROVERA) 10 MG tablet Take 1 tablet (10 mg total) by mouth daily. 11/27/13   Marny Lowenstein, PA-C  oxyCODONE-acetaminophen (PERCOCET) 5-325 MG per tablet Take 1 tablet by mouth every 4 (four) hours as needed. 01/15/14   Vida Roller, MD  propylthiouracil (PTU) 50 MG tablet Take 1 tablet (50 mg total) by mouth daily. 07/09/13   Minta Balsam, MD  traMADol (ULTRAM) 50 MG tablet Take 100 mg by mouth every 6 (  six) hours as needed for moderate pain.    Historical Provider, MD   There were no vitals taken for this visit. Physical Exam  Nursing note and vitals reviewed. Constitutional: She is oriented to person, place, and time. She appears well-developed and well-nourished. No distress.  HENT:  Head: Normocephalic and atraumatic.  Eyes: Conjunctivae and EOM are normal.  Cardiovascular: Normal rate and regular rhythm.   Pulmonary/Chest: Effort normal and breath sounds normal. No stridor. No respiratory distress.  Abdominal: She exhibits no distension.  Musculoskeletal: She exhibits no edema.  Neurological: She is alert and oriented to person, place, and time. No cranial nerve deficit.  Skin: Skin is warm and  dry.  Psychiatric: Her mood appears anxious. Her affect is angry, blunt and inappropriate. Her speech is rapid and/or pressured. She is agitated, aggressive, hyperactive and combative. Thought content is delusional. Cognition and memory are impaired. She expresses impulsivity. She expresses no homicidal and no suicidal ideation. She expresses no suicidal plans.    ED Course  Procedures (including critical care time) Labs Review Labs Reviewed  COMPREHENSIVE METABOLIC PANEL - Abnormal; Notable for the following:    Albumin 3.4 (*)    GFR calc non Af Amer 72 (*)    GFR calc Af Amer 84 (*)    All other components within normal limits  SALICYLATE LEVEL - Abnormal; Notable for the following:    Salicylate Lvl <2.0 (*)    All other components within normal limits  CBC  ETHANOL  ACETAMINOPHEN LEVEL  URINALYSIS, ROUTINE W REFLEX MICROSCOPIC  PREGNANCY, URINE  URINE RAPID DRUG SCREEN (HOSP PERFORMED)    Patient yelling, screaming, threatening to grabbed a gun from please officers if she does not receive what she wants, which is a changing list of requests, including Bible, food, closed, Viagra, no crack, no weed.   Given the patient's yelling, inability to control herself, threatening behavior, threatening actions towards herself, she received Geodon.  On repeat exam the patient is less aggressive  10:29 AM Patient asleep  I discussed this case with our behavioral health team, when patient is more awake, she will be reassessed.  3:17 PM Patient remains asleep MDM   Patient history bipolar disorder, manic behavior, now presents for unclear reasons, but is immediately found to be uncooperative, aggressive, yelling, swearing, and endangering herself, voicing threats to others. Given the patient's history, she received sedation.  It seems as though the patient has other medication noncompliance, new psychiatric disorder, given the patient's reassuring labs, vital signs. The patient was  asleep on signout, but evaluation is a still a work in progress, as she is awaiting behavioral health evaluation.   CRITICAL CARE Performed by: Gerhard Munch Total critical care time: 40 Critical care time was exclusive of separately billable procedures and treating other patients. Critical care was necessary to treat or prevent imminent or life-threatening deterioration. Critical care was time spent personally by me on the following activities: development of treatment plan with patient and/or surrogate as well as nursing, discussions with consultants, evaluation of patient's response to treatment, examination of patient, obtaining history from patient or surrogate, ordering and performing treatments and interventions, ordering and review of laboratory studies, ordering and review of radiographic studies, pulse oximetry and re-evaluation of patient's condition.   Gerhard Munch, MD 03/15/14 [phone]

## 2014-03-15 NOTE — BH Assessment (Signed)
TTS attempted tele assessment. Patient refused to participate and yelled "leave me alone." Stated "I am supposed to go to [place], my family will pick me up, I am pregnant, bring me my breakfast." RN or MD to consult TTS once patient appropriate for assessment.

## 2014-03-15 NOTE — ED Notes (Signed)
Staffing called for sitter/trained hospital staff sitting with pt

## 2014-03-15 NOTE — ED Notes (Signed)
Pt friend Mickie Bail- cell [phone]

## 2014-03-15 NOTE — ED Notes (Signed)
Christine Cobb, NT at bedside gave pt juice.

## 2014-03-15 NOTE — ED Notes (Signed)
Myself and Donnamae Jude, RN walked pt back to room with [employer] in tow; pt came in with [facility] Officers; pt given a warm blanket and wine colored scrubs and non-slip socks to change in to; Samson, RN and Jeraldine Loots, MD present in room also
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
## 2014-03-15 NOTE — ED Provider Notes (Signed)
Nursing asked myself to assess patient in emergency department. Initial plan was for patient to be assessed by psychiatry. Patient refused to be assessed by psychiatry and had worsening manic symptoms. Patient is very aggressive towards all staff and threatening to use the police officers gone. I tried to be, the patient multiple different ways and patient would not discuss anything further and was yelling and threatening. IVC paperwork was filled out. Chemical restraints were used. Psychiatry assessment pending. Patient aggressive, yelling out, angry, tangential thoughts, visual hallucinations, grossly normal strength bilateral extremities and pupils appear equal from a distance. CRITICAL CARE Performed by: Enid Skeens   Total critical care time: 35 min  Critical care time was exclusive of separately billable procedures and treating other patients.  Critical care was necessary to treat or prevent imminent or life-threatening deterioration.  Critical care was time spent personally by me on the following activities: development of treatment plan with patient and/or surrogate as well as nursing, discussions with consultants, evaluation of patient's response to treatment, examination of patient, obtaining history from patient or surrogate, ordering and performing treatments and interventions, ordering and review of laboratory studies, ordering and review of radiographic studies, pulse oximetry and re-evaluation of patient's condition.  Manic episode, acute psychosis  Enid Skeens, MD 03/15/14 [id number]

## 2014-03-15 NOTE — ED Notes (Signed)
Report given to Brandi in pod C. Pt still resting.

## 2014-03-16 ENCOUNTER — Encounter (HOSPITAL_COMMUNITY): Payer: Self-pay | Admitting: Psychiatry

## 2014-03-16 DIAGNOSIS — F3110 Bipolar disorder, current episode manic without psychotic features, unspecified: Secondary | ICD-10-CM | POA: Diagnosis present

## 2014-03-16 LAB — HCG, QUANTITATIVE, PREGNANCY

## 2014-03-16 MED ORDER — HALOPERIDOL 5 MG PO TABS
5.0000 mg | ORAL_TABLET | Freq: Two times a day (BID) | ORAL | Status: DC
  Administered 2014-03-16 – 2014-03-18 (×4): 5 mg via ORAL
  Filled 2014-03-16 (×4): qty 1

## 2014-03-16 MED ORDER — LORAZEPAM 2 MG/ML IJ SOLN
2.0000 mg | Freq: Once | INTRAMUSCULAR | Status: AC
  Administered 2014-03-16: 2 mg via INTRAMUSCULAR
  Filled 2014-03-16: qty 1

## 2014-03-16 MED ORDER — DIPHENHYDRAMINE HCL 50 MG/ML IJ SOLN
50.0000 mg | Freq: Once | INTRAMUSCULAR | Status: AC
  Administered 2014-03-16: 50 mg via INTRAMUSCULAR
  Filled 2014-03-16: qty 1

## 2014-03-16 NOTE — ED Notes (Signed)
Pt. Escorted off unit per [employer].

## 2014-03-16 NOTE — ED Notes (Signed)
Calm, pleasant, polite. At nurses station using phone.

## 2014-03-16 NOTE — Progress Notes (Signed)
Due to inptx being recommended the following facilities have been contacted regarding bed availability:  REFERRAL FAXED: [facility]- per [facility] beds available [facility]- per Archie Patten one bed available [facility]- per Pattricia Boss can fax for review [facility]- per Victorino Dike female beds available [facility]- per Alinda Money can fax for review, beds available  AT CAPACITY: [facility]- per [employer]- per Johny Chess- per Kandra Nicolas- per Amy Rutherford- per [facility]- per Elmore Guise- per [facility]- per Myrene Bud[facility]- per Dannielle Huh unsure of bed status and asked that TTS call back later Sharyne Richters- per [facility]- per Morrie Sheldon [facility]- per Charolotte Eke- per [facility]- per Darl Pikes unsure of current bed status and asked to check back  *per [facility] beds available but [facility] Disposition MHT

## 2014-03-16 NOTE — ED Provider Notes (Signed)
Pt was supposed to be sent to [facility] to await placement, however due to lack of bed availability pt is now back in the [facility] ER    Joya Gaskins, MD 03/16/14 [phone]

## 2014-03-16 NOTE — ED Notes (Signed)
At nurses station escalating about using the phone. Loud, hollering, unable to redirect or reason with. Security and [employer] called to assist. Catha Nottingham NP notified of situation. Orders received and initiated.

## 2014-03-16 NOTE — BH Assessment (Signed)
Assessment completed. Consulted Alberteen Sam, NP who recommends inpatient treatment. Informed Dr. Ranae Palms of recommendation.
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
## 2014-03-16 NOTE — BH Assessment (Signed)
Called to see if pt would be available for assessment. Per Zella Ball, RN pt is alert but very agitated. This Clinical research associate will call for status update in about 30 minutes and will attempt assessment at that time if possible.  Clista Bernhardt, [facility] Triage Specialist 03/16/2014 3:40 AM

## 2014-03-16 NOTE — ED Notes (Signed)
Pt. Returned to unit escorted by [employer]. Placed in room. C21.

## 2014-03-16 NOTE — ED Notes (Signed)
Pt. cursing at staff. Stating she wants a room with a TV, call bell and a phone. She states she is going to "piss" in every bed in every room we have. Pt. Escorted to her room.

## 2014-03-16 NOTE — BH Assessment (Signed)
Tele Assessment Note   Christine Cobb is an 38 y.o. female presenting to [facility] reporting that she has been off of her medications for approximately 3 weeks. Pt reported that her vehicle was impounded and her medications were in her vehicle that she no longer has access to.  Pt denies SI, HI and AVH at this time. Pt is delusional and believes she is pregnant with twins. 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 Pt reported that she has been hospitalized in the past. Pt stated "I am supposed to be at [facility], that's where all my records are at". Pt also requested that this clinician contact [facility] to get her records so that she won't have to answer any questions. Pt reported that she is depressed and stated "what pregnant woman you know isn't a little depress. Pt denied having any guns but stated "I am a weapon, I am an evangelist". PT did not report any physical, emotional or sexual abuse at this time.   Axis I: Schizophrenia  Axis II: Deferred Axis III:  Past Medical History  Diagnosis Date  . Sinus tachycardia   . HTN (hypertension)   . Arm pain   . Eye globe prosthesis   . CHF (congestive heart failure)   . Bipolar affective disorder, currently manic, mild   . Hyperthyroidism    Axis IV: problems with primary support group Axis V: 11-20 some danger of hurting self or others possible OR occasionally fails to maintain minimal personal hygiene OR gross impairment in communication  Past Medical History:  Past Medical History  Diagnosis Date  . Sinus tachycardia   . HTN (hypertension)   . Arm pain   . Eye globe prosthesis   . CHF (congestive heart failure)   . Bipolar affective disorder, currently manic, mild   . Hyperthyroidism     Past Surgical History  Procedure Laterality Date  . Eye surgery    . Dilation and  curettage of uterus      Family History:  Family History  Problem Relation Age of Onset  . Hypertension Other   . Emphysema Other   . Asthma Son   . Diabetes Maternal Uncle   . Diabetes Paternal Grandmother     Social History:  reports that she has been smoking Cigarettes.  She has been smoking about 1.00 pack per day. She has never used smokeless tobacco. She reports that she uses illicit drugs (Marijuana). She reports that she does not drink alcohol.  Additional Social History:  Alcohol / Drug Use History of alcohol / drug use?: No history of alcohol / drug abuse  CIWA: CIWA-Ar BP: 124/84 mmHg Pulse Rate: 67 COWS:    PATIENT STRENGTHS: (choose at least two) Average or above average intelligence Supportive family/friends  Allergies:  Allergies  Allergen Reactions  . Amoxil [Amoxicillin] Anaphylaxis  . Trazodone And Nefazodone Anaphylaxis  . Ketorolac Tromethamine Hives and Swelling  . Prenatal [B-Plex Plus] Nausea Only  . Tegretol [Carbamazepine] Other (See Comments)    Swelling, skin peeling, skin discoloration    Home Medications:  (Not in a hospital admission)  OB/GYN Status:  No LMP recorded.  General Assessment Data Location of Assessment: [facility] ED Is this a Tele or Face-to-Face Assessment?: Face-to-Face Is this an Initial Assessment or a Re-assessment for this encounter?:  Initial Assessment Living Arrangements: Spouse/significant other;Children Can pt return to current living arrangement?: Yes Admission Status: Involuntary Is patient capable of signing voluntary admission?: Yes Transfer from: Acute Hospital Referral Source: MD     [facility] Crisis Care Plan Living Arrangements: Spouse/significant other;Children Name of Psychiatrist: Vesta Mixer  Name of Therapist: [facility]  Education Status Is patient currently in school?: No Current Grade: NA Highest grade of school patient has completed: 12 Name of school: NA Contact person: NA  Risk to self with the  past 6 months Suicidal Ideation: No Suicidal Intent: No Is patient at risk for suicide?: No Suicidal Plan?: No Access to Means: No What has been your use of drugs/alcohol within the last 12 months?: No alcohol or drug use reported Previous Attempts/Gestures: No How many times?: 0 Other Self Harm Risks: No other self harm risk identified at this time.  Triggers for Past Attempts: None known Intentional Self Injurious Behavior: None Family Suicide History: No Recent stressful life event(s):  (No stressful life events reported at this time) Persecutory voices/beliefs?: No Depression: Yes Depression Symptoms: Feeling angry/irritable Substance abuse history and/or treatment for substance abuse?: No Suicide prevention information given to non-admitted patients: Not applicable  Risk to Others within the past 6 months Homicidal Ideation: No-Not Currently/Within Last 6 Months Current Homicidal Plan: No Access to Homicidal Means: No Identified Victim: NA History of harm to others?: No Assessment of Violence: None Noted Violent Behavior Description: No violent behaviors observed at this time. Pt is calm and cooperative. Does patient have access to weapons?: No (Pt stated "I am a weapon". ) Criminal Charges Pending?: No Does patient have a court date: No  Psychosis Hallucinations: None noted Delusions: Unspecified  Mental Status Report Appear/Hygiene: In scrubs Eye Contact: Good Motor Activity: Freedom of movement Speech: Rapid Level of Consciousness: Quiet/awake Mood: Labile Affect: Labile Anxiety Level: Minimal Thought Processes: Coherent;Relevant Judgement: Unimpaired Orientation: Situation;Time;Person;Place Obsessive Compulsive Thoughts/Behaviors: None  Cognitive Functioning Concentration: Normal Memory: Remote Intact;Recent Intact IQ: Average Insight: Fair Impulse Control: Fair Appetite: Good Weight Loss: 0 Weight Gain: 0 Sleep: Decreased Vegetative Symptoms:  None  ADLScreening [facility] Assessment Services) Patient's cognitive ability adequate to safely complete daily activities?: Yes Patient able to express need for assistance with ADLs?: Yes Independently performs ADLs?: Yes (appropriate for developmental age)  Prior Inpatient Therapy Prior Inpatient Therapy: Yes Prior Therapy Dates: 2014 Prior Therapy Facilty/Provider(s): [facility]  Reason for Treatment: Bipolar   Prior Outpatient Therapy Prior Outpatient Therapy: Yes Prior Therapy Dates: 2015 Prior Therapy Facilty/Provider(s): [facility] Reason for Treatment: Bipolar  ADL Screening (condition at time of admission) Patient's cognitive ability adequate to safely complete daily activities?: Yes Is the patient deaf or have difficulty hearing?: No Does the patient have difficulty seeing, even when wearing glasses/contacts?: No Does the patient have difficulty concentrating, remembering, or making decisions?: No Patient able to express need for assistance with ADLs?: Yes Does the patient have difficulty dressing or bathing?: No Independently performs ADLs?: Yes (appropriate for developmental age)       Abuse/Neglect Assessment (Assessment to be complete while patient is alone) Physical Abuse: Denies Verbal Abuse: Denies Sexual Abuse: Denies Exploitation of patient/patient's resources: Denies Self-Neglect: Denies          Additional Information 1:1 In Past 12 Months?: Yes CIRT Risk: No Elopement Risk: No Does patient have medical clearance?: Yes     Disposition:  Disposition Initial Assessment Completed for this Encounter: Yes Disposition of Patient: Inpatient treatment program Type of inpatient treatment program: Adult  Tedra Coppernoll  S 03/16/2014 7:05 AM

## 2014-03-16 NOTE — ED Notes (Signed)
Injections given without incident. [employer] and [employer] (security) standing by.

## 2014-03-16 NOTE — Progress Notes (Signed)
1835 received phone call back from [place] at [facility], states pt has been added to their wait list.   Tomi Bamberger Disposition MHT

## 2014-03-16 NOTE — ED Notes (Signed)
Pt. Escorted off of unit per [employer].

## 2014-03-16 NOTE — ED Notes (Signed)
Bed: PYK99 Expected date:  Expected time:  Means of arrival:  Comments: Hold pt from [facility] ED

## 2014-03-16 NOTE — BH Assessment (Signed)
Unable to assess pt per Zella Ball, RN as she is moving to [employer].  Clista Bernhardt, [facility] Triage Specialist 03/16/2014 4:25 AM

## 2014-03-16 NOTE — ED Provider Notes (Signed)
The patient appears reasonably stabilized for transfer considering the current resources, flow, and capabilities available in the ED at this time, and I doubt any other [facility] requiring further screening and/or treatment in the ED prior to transfer.   Joya Gaskins, MD 03/16/14 [phone]

## 2014-03-16 NOTE — Consult Note (Signed)
[facility] Face-to-Face Psychiatry Consult   Reason for Consult:  Psychosis  Referring Physician:  EDP  Christine Cobb is an 38 y.o. female. Total Time spent with patient: 20 minutes  Assessment: AXIS I:  Bipolar, Manic AXIS II:  Deferred AXIS III:   Past Medical History  Diagnosis Date  . Sinus tachycardia   . HTN (hypertension)   . Arm pain   . Eye globe prosthesis   . CHF (congestive heart failure)   . Bipolar affective disorder, currently manic, mild   . Hyperthyroidism    AXIS IV:  other psychosocial or environmental problems, problems related to social environment and problems with primary support group AXIS V:  11-20 some danger of hurting self or others possible OR occasionally fails to maintain minimal personal hygiene OR gross impairment in communication  Plan:  Recommend psychiatric Inpatient admission when medically cleared.  Dr. Parke Poisson assessed the patient and concurs with the plan.  Subjective:   Christine Cobb is a 38 y.o. female patient admitted with mania.  HPI:  Patient is labile and irritable at times.  She was agitated this morning and required PRN Geodon, Benadryl, and Ativan.  Merlene reports having a baby in June who was recently hospitalized.  She thinks she is pregnant with twins and due in June, "will be here in June, leave that up to God."  Pregnancy text negative.  She complains of knee, hip pain.  Hyperthyroidism x 3 years, labs ordered.  Denies alcohol and drug use.  Remains manic with pressured speech, increased energy, no sleep,  HPI Elements:   Location:  generalized. Quality:  acute. Severity:  severe. Timing:  constant. Duration:  past few days. Context:  stressors, not taking her medications.  Past Psychiatric History: Past Medical History  Diagnosis Date  . Sinus tachycardia   . HTN (hypertension)   . Arm pain   . Eye globe prosthesis   . CHF (congestive heart failure)   . Bipolar affective disorder, currently manic, mild   .  Hyperthyroidism     reports that she has been smoking Cigarettes.  She has been smoking about 1.00 pack per day. She has never used smokeless tobacco. She reports that she uses illicit drugs (Marijuana). She reports that she does not drink alcohol. Family History  Problem Relation Age of Onset  . Hypertension Other   . Emphysema Other   . Asthma Son   . Diabetes Maternal Uncle   . Diabetes Paternal Grandmother    Family History Substance Abuse: No Family Supports: Yes, List: (Husband) Living Arrangements: Spouse/significant other;Children Can pt return to current living arrangement?: Yes Abuse/Neglect [facility]) Physical Abuse: Denies Verbal Abuse: Denies Sexual Abuse: Denies Allergies:   Allergies  Allergen Reactions  . Amoxil [Amoxicillin] Anaphylaxis  . Trazodone And Nefazodone Anaphylaxis  . Ketorolac Tromethamine Hives and Swelling  . Prenatal [B-Plex Plus] Nausea Only  . Tegretol [Carbamazepine] Other (See Comments)    Swelling, skin peeling, skin discoloration    ACT Assessment Complete:  Yes:    Educational Status    Risk to Self: Risk to self with the past 6 months Suicidal Ideation: No Suicidal Intent: No Is patient at risk for suicide?: No Suicidal Plan?: No Access to Means: No What has been your use of drugs/alcohol within the last 12 months?: No alcohol or drug use reported Previous Attempts/Gestures: No How many times?: 0 Other Self Harm Risks: No other self harm risk identified at this time.  Triggers for Past Attempts: None known  Intentional Self Injurious Behavior: None Family Suicide History: No Recent stressful life event(s):  (No stressful life events reported at this time) Persecutory voices/beliefs?: No Depression: Yes Depression Symptoms: Feeling angry/irritable Substance abuse history and/or treatment for substance abuse?: No Suicide prevention information given to non-admitted patients: Not applicable  Risk to Others: Risk to Others within the  past 6 months Homicidal Ideation: No-Not Currently/Within Last 6 Months Current Homicidal Plan: No Access to Homicidal Means: No Identified Victim: NA History of harm to others?: No Assessment of Violence: None Noted Violent Behavior Description: No violent behaviors observed at this time. Pt is calm and cooperative. Does patient have access to weapons?: No (Pt stated "I am a weapon". ) Criminal Charges Pending?: No Does patient have a court date: No  Abuse: Abuse/Neglect Assessment (Assessment to be complete while patient is alone) Physical Abuse: Denies Verbal Abuse: Denies Sexual Abuse: Denies Exploitation of patient/patient's resources: Denies Self-Neglect: Denies  Prior Inpatient Therapy: Prior Inpatient Therapy Prior Inpatient Therapy: Yes Prior Therapy Dates: 2014 Prior Therapy Facilty/Provider(s): [facility]  Reason for Treatment: Bipolar   Prior Outpatient Therapy: Prior Outpatient Therapy Prior Outpatient Therapy: Yes Prior Therapy Dates: 2015 Prior Therapy Facilty/Provider(s): [facility] Reason for Treatment: Bipolar  Additional Information: Additional Information 1:1 In Past 12 Months?: Yes CIRT Risk: No Elopement Risk: No Does patient have medical clearance?: Yes                  Objective: Blood pressure 124/84, pulse 67, temperature 97.6 F (36.4 C), temperature source Oral, resp. rate 18, SpO2 100.00%, not currently breastfeeding.There is no weight on file to calculate BMI. Results for orders placed during the hospital encounter of 03/15/14 (from the past 72 hour(s))  CBC     Status: None   Collection Time    03/15/14 11:12 AM      Result Value Ref Range   WBC 7.0  4.0 - 10.5 K/uL   RBC 4.36  3.87 - 5.11 MIL/uL   Hemoglobin 12.9  12.0 - 15.0 g/dL   HCT 38.1  36.0 - 46.0 %   MCV 87.4  78.0 - 100.0 fL   MCH 29.6  26.0 - 34.0 pg   MCHC 33.9  30.0 - 36.0 g/dL   RDW 14.0  11.5 - 15.5 %   Platelets 232  150 - 400 K/uL  COMPREHENSIVE  METABOLIC PANEL     Status: Abnormal   Collection Time    03/15/14 11:12 AM      Result Value Ref Range   Sodium 142  137 - 147 mEq/L   Potassium 3.8  3.7 - 5.3 mEq/L   Chloride 108  96 - 112 mEq/L   CO2 24  19 - 32 mEq/L   Glucose, Bld 99  70 - 99 mg/dL   BUN 8  6 - 23 mg/dL   Creatinine, Ser 1.00  0.50 - 1.10 mg/dL   Calcium 9.1  8.4 - 10.5 mg/dL   Total Protein 6.3  6.0 - 8.3 g/dL   Albumin 3.4 (*) 3.5 - 5.2 g/dL   AST 18  0 - 37 U/L   Comment: HEMOLYSIS AT THIS LEVEL MAY AFFECT RESULT   ALT 13  0 - 35 U/L   Alkaline Phosphatase 57  39 - 117 U/L   Total Bilirubin 0.8  0.3 - 1.2 mg/dL   GFR calc non Af Amer 72 (*) >90 mL/min   GFR calc Af Amer 84 (*) >90 mL/min   Comment: (NOTE)  The eGFR has been calculated using the CKD EPI equation.     This calculation has not been validated in all clinical situations.     eGFR's persistently <90 mL/min signify possible Chronic Kidney     Disease.   Anion gap 10  5 - 15  ETHANOL     Status: None   Collection Time    03/15/14 11:12 AM      Result Value Ref Range   Alcohol, Ethyl (B) <11  0 - 11 mg/dL   Comment:            LOWEST DETECTABLE LIMIT FOR     SERUM ALCOHOL IS 11 mg/dL     FOR MEDICAL PURPOSES ONLY  ACETAMINOPHEN LEVEL     Status: None   Collection Time    03/15/14 11:12 AM      Result Value Ref Range   Acetaminophen (Tylenol), Serum <15.0  10 - 30 ug/mL   Comment:            THERAPEUTIC CONCENTRATIONS VARY     SIGNIFICANTLY. A RANGE OF 10-30     ug/mL MAY BE AN EFFECTIVE     CONCENTRATION FOR MANY PATIENTS.     HOWEVER, SOME ARE BEST TREATED     AT CONCENTRATIONS OUTSIDE THIS     RANGE.     ACETAMINOPHEN CONCENTRATIONS     >150 ug/mL AT 4 HOURS AFTER     INGESTION AND >50 ug/mL AT 12     HOURS AFTER INGESTION ARE     OFTEN ASSOCIATED WITH TOXIC     REACTIONS.  SALICYLATE LEVEL     Status: Abnormal   Collection Time    03/15/14 11:12 AM      Result Value Ref Range   Salicylate Lvl <7.8 (*) 2.8 - 20.0 mg/dL   HCG, QUANTITATIVE, PREGNANCY     Status: None   Collection Time    03/15/14 11:12 AM      Result Value Ref Range   hCG, Beta Chain, Quant, S <1  <5 mIU/mL   Comment:              GEST. AGE      CONC.  (mIU/mL)       <=1 WEEK        5 - 50         2 WEEKS       50 - 500         3 WEEKS       100 - 10,000         4 WEEKS     1,000 - 30,000         5 WEEKS     3,500 - 115,000       6-8 WEEKS     12,000 - 270,000        12 WEEKS     15,000 - 220,000                FEMALE AND NON-PREGNANT FEMALE:         LESS THAN 5 mIU/mL   Labs are reviewed and are pertinent for no medical issues noted.  Current Facility-Administered Medications  Medication Dose Route Frequency Provider Last Rate Last Dose  . ALPRAZolam Duanne Moron) tablet 1 mg  1 mg Oral QHS PRN Carmin Muskrat, MD      . furosemide (LASIX) tablet 20 mg  20 mg Oral Daily Carmin Muskrat, MD      . haloperidol (  HALDOL) tablet 5 mg  5 mg Oral QHS PRN,MR X 1 Carmin Muskrat, MD      . lisinopril (PRINIVIL,ZESTRIL) tablet 20 mg  20 mg Oral Daily Carmin Muskrat, MD   20 mg at 03/15/14 1814  . medroxyPROGESTERone (PROVERA) tablet 10 mg  10 mg Oral Daily Carmin Muskrat, MD      . propylthiouracil (PTU) tablet 50 mg  50 mg Oral Daily Carmin Muskrat, MD   50 mg at 03/15/14 1814  . traMADol (ULTRAM) tablet 100 mg  100 mg Oral Q6H PRN Carmin Muskrat, MD      . ziprasidone (GEODON) injection 20 mg  20 mg Intramuscular Q6H PRN Carmin Muskrat, MD   20 mg at 03/15/14 1839   Current Outpatient Prescriptions  Medication Sig Dispense Refill  . ALPRAZolam (XANAX) 0.5 MG tablet Take 0.5 mg by mouth at bedtime as needed for anxiety (anxiety).      . ALPRAZolam (XANAX) 1 MG tablet Take 1 tablet (1 mg total) by mouth at bedtime as needed for anxiety.  10 tablet  0  . furosemide (LASIX) 20 MG tablet Take 1 tablet (20 mg total) by mouth daily.  10 tablet  0  . haloperidol (HALDOL) 5 MG tablet Take 5 mg by mouth at bedtime as needed and may repeat dose one  time if needed for agitation.      Marland Kitchen ibuprofen (ADVIL,MOTRIN) 600 MG tablet Take 1 tablet (600 mg total) by mouth 4 (four) times daily.  30 tablet  0  . lisinopril (PRINIVIL,ZESTRIL) 20 MG tablet Take 20 mg by mouth daily.      . medroxyPROGESTERone (PROVERA) 10 MG tablet Take 1 tablet (10 mg total) by mouth daily.  30 tablet  0  . oxyCODONE-acetaminophen (PERCOCET) 5-325 MG per tablet Take 1 tablet by mouth every 4 (four) hours as needed.  10 tablet  0  . propylthiouracil (PTU) 50 MG tablet Take 1 tablet (50 mg total) by mouth daily.  60 tablet  1  . traMADol (ULTRAM) 50 MG tablet Take 100 mg by mouth every 6 (six) hours as needed for moderate pain.        Psychiatric Specialty Exam:     Blood pressure 124/84, pulse 67, temperature 97.6 F (36.4 C), temperature source Oral, resp. rate 18, SpO2 100.00%, not currently breastfeeding.There is no weight on file to calculate BMI.  General Appearance: Disheveled  Eye Sport and exercise psychologist::  Fair  Speech:  Pressured  Volume:  Normal  Mood:  Angry, Anxious and Irritable  Affect:  Labile  Thought Process:  Disorganized  Orientation:  Full (Time, Place, and Person)  Thought Content:  Delusions and Hallucinations: Auditory  Suicidal Thoughts:  No  Homicidal Thoughts:  No  Memory:  Immediate;   Poor Recent;   Poor Remote;   Fair  Judgement:  Impaired  Insight:  Lacking  Psychomotor Activity:  Increased  Concentration:  Fair  Recall:  [employer]: Fair  Akathisia:  No  Handed:  Right  AIMS (if indicated):     Assets:  Leisure Time Physical Health Resilience Social Support  Sleep:      Musculoskeletal: Strength & Muscle Tone: within normal limits Gait & Station: normal Patient leans: N/A  Treatment Plan Summary: Daily contact with patient to assess and evaluate symptoms and progress in treatment Medication management; admit to inpatient psychiatric unit for stabilization  Waylan Boga, PMH-NP 03/16/2014 8:56  AM  Patient seen and evaluated with NP as above

## 2014-03-16 NOTE — ED Notes (Signed)
Patient presents irritable, needing to see all medications before taking them; requesting that her Geodon and Ativan be given IM. Denies that she needs to be here, denies SI/HI/AVH. NAD

## 2014-03-17 ENCOUNTER — Encounter (HOSPITAL_COMMUNITY): Payer: Self-pay | Admitting: Registered Nurse

## 2014-03-17 DIAGNOSIS — F309 Manic episode, unspecified: Secondary | ICD-10-CM | POA: Diagnosis not present

## 2014-03-17 DIAGNOSIS — F3110 Bipolar disorder, current episode manic without psychotic features, unspecified: Secondary | ICD-10-CM | POA: Diagnosis not present

## 2014-03-17 LAB — URINALYSIS, ROUTINE W REFLEX MICROSCOPIC
BILIRUBIN URINE: NEGATIVE
Glucose, UA: NEGATIVE mg/dL
Hgb urine dipstick: NEGATIVE
Ketones, ur: NEGATIVE mg/dL
LEUKOCYTES UA: NEGATIVE
Nitrite: NEGATIVE
PH: 7 (ref 5.0–8.0)
Protein, ur: NEGATIVE mg/dL
SPECIFIC GRAVITY, URINE: 1.006 (ref 1.005–1.030)
Urobilinogen, UA: 0.2 mg/dL (ref 0.0–1.0)

## 2014-03-17 LAB — RAPID URINE DRUG SCREEN, HOSP PERFORMED
Amphetamines: NOT DETECTED
Barbiturates: NOT DETECTED
Benzodiazepines: NOT DETECTED
Cocaine: NOT DETECTED
Opiates: NOT DETECTED
Tetrahydrocannabinol: NOT DETECTED

## 2014-03-17 LAB — T4, FREE: Free T4: 1.17 ng/dL (ref 0.80–1.80)

## 2014-03-17 LAB — PREGNANCY, URINE: Preg Test, Ur: NEGATIVE

## 2014-03-17 NOTE — Progress Notes (Signed)
MHT contacted the following facilities for inpatient treatment:  1)[facility]-faxed referral 2)[facility]-no beds 3)[facility]-fax referral per [facility] beds 5)[facility]-fax referral  6)[facility]-no beds 7)[facility]-faxed referral 8)[facility]ed referral 9)[facility]-under review 10)[facility]-under review 11)[facility]-under review 12)[facility]-on waitlist  Blain Pais, MHT/NS

## 2014-03-17 NOTE — ED Notes (Signed)
Writer allowing pt to sleep, RN notified

## 2014-03-17 NOTE — Consult Note (Signed)
[facility] Follow UP Psychiatry Consult   Reason for Consult:  Psychosis  Referring Physician:  EDP  Christine Cobb is an 38 y.o. female. Total Time spent with patient: 20 minutes  Assessment: AXIS I:  Bipolar, Manic AXIS II:  Deferred AXIS III:   Past Medical History  Diagnosis Date  . Sinus tachycardia   . HTN (hypertension)   . Arm pain   . Eye globe prosthesis   . CHF (congestive heart failure)   . Bipolar affective disorder, currently manic, mild   . Hyperthyroidism    AXIS IV:  other psychosocial or environmental problems, problems related to social environment and problems with primary support group AXIS V:  11-20 some danger of hurting self or others possible OR occasionally fails to maintain minimal personal hygiene OR gross impairment in communication  Plan:  Recommend psychiatric Inpatient admission when medically cleared.  Dr. Parke Poisson assessed the patient and concurs with the plan.  Subjective:   Christine Cobb is a 38 y.o. female patient admitted with mania.  HPI:  Patient states that she continues to hear voices "telling me to hurt myself and other people.  I know I'm not myself; that's why I came to the hospital."  Patient also continue to say that she is pregnant with twins "I need to get to [facility] so that I can be sure that they are eating right and growing right"  Patient states that she has a 77 month old son at home.  Patient continues to endorse low energy and that she is unable to sleep "in a place like this with all the noise and lights."    HPI Elements:   Location:  generalized. Quality:  acute. Severity:  severe. Timing:  constant. Duration:  past few days. Context:  stressors, not taking her medications.  Past Psychiatric History: Past Medical History  Diagnosis Date  . Sinus tachycardia   . HTN (hypertension)   . Arm pain   . Eye globe prosthesis   . CHF (congestive heart failure)   . Bipolar affective disorder, currently manic, mild    . Hyperthyroidism     reports that she has been smoking Cigarettes.  She has been smoking about 1.00 pack per day. She has never used smokeless tobacco. She reports that she uses illicit drugs (Marijuana). She reports that she does not drink alcohol. Family History  Problem Relation Age of Onset  . Hypertension Other   . Emphysema Other   . Asthma Son   . Diabetes Maternal Uncle   . Diabetes Paternal Grandmother    Family History Substance Abuse: No Family Supports: Yes, List: (Husband) Living Arrangements: Spouse/significant other;Children Can pt return to current living arrangement?: Yes Abuse/Neglect [facility]) Physical Abuse: Denies Verbal Abuse: Denies Sexual Abuse: Denies Allergies:   Allergies  Allergen Reactions  . Amoxil [Amoxicillin] Anaphylaxis  . Trazodone And Nefazodone Anaphylaxis  . Ketorolac Tromethamine Hives and Swelling  . Prenatal [B-Plex Plus] Nausea Only  . Tegretol [Carbamazepine] Other (See Comments)    Swelling, skin peeling, skin discoloration    ACT Assessment Complete:  Yes:    Educational Status    Risk to Self: Risk to self with the past 6 months Suicidal Ideation: No Suicidal Intent: No Is patient at risk for suicide?: No Suicidal Plan?: No Access to Means: No What has been your use of drugs/alcohol within the last 12 months?: No alcohol or drug use reported Previous Attempts/Gestures: No How many times?: 0 Other Self Harm Risks: No  other self harm risk identified at this time.  Triggers for Past Attempts: None known Intentional Self Injurious Behavior: None Family Suicide History: No Recent stressful life event(s):  (No stressful life events reported at this time) Persecutory voices/beliefs?: No Depression: Yes Depression Symptoms: Feeling angry/irritable Substance abuse history and/or treatment for substance abuse?: No Suicide prevention information given to non-admitted patients: Not applicable  Risk to Others: Risk to Others within  the past 6 months Homicidal Ideation: No-Not Currently/Within Last 6 Months Current Homicidal Plan: No Access to Homicidal Means: No Identified Victim: NA History of harm to others?: No Assessment of Violence: None Noted Violent Behavior Description: No violent behaviors observed at this time. Pt is calm and cooperative. Does patient have access to weapons?: No (Pt stated "I am a weapon". ) Criminal Charges Pending?: No Does patient have a court date: No  Abuse: Abuse/Neglect Assessment (Assessment to be complete while patient is alone) Physical Abuse: Denies Verbal Abuse: Denies Sexual Abuse: Denies Exploitation of patient/patient's resources: Denies Self-Neglect: Denies  Prior Inpatient Therapy: Prior Inpatient Therapy Prior Inpatient Therapy: Yes Prior Therapy Dates: 2014 Prior Therapy Facilty/Provider(s): [facility]  Reason for Treatment: Bipolar   Prior Outpatient Therapy: Prior Outpatient Therapy Prior Outpatient Therapy: Yes Prior Therapy Dates: 2015 Prior Therapy Facilty/Provider(s): [facility] Reason for Treatment: Bipolar  Additional Information: Additional Information 1:1 In Past 12 Months?: Yes CIRT Risk: No Elopement Risk: No Does patient have medical clearance?: Yes                  Objective: Blood pressure 110/92, pulse 102, temperature 98.5 F (36.9 C), temperature source Oral, resp. rate 20, SpO2 99.00%, not currently breastfeeding.There is no weight on file to calculate BMI. Results for orders placed during the hospital encounter of 03/15/14 (from the past 72 hour(s))  CBC     Status: None   Collection Time    03/15/14 11:12 AM      Result Value Ref Range   WBC 7.0  4.0 - 10.5 K/uL   RBC 4.36  3.87 - 5.11 MIL/uL   Hemoglobin 12.9  12.0 - 15.0 g/dL   HCT 38.1  36.0 - 46.0 %   MCV 87.4  78.0 - 100.0 fL   MCH 29.6  26.0 - 34.0 pg   MCHC 33.9  30.0 - 36.0 g/dL   RDW 14.0  11.5 - 15.5 %   Platelets 232  150 - 400 K/uL  COMPREHENSIVE  METABOLIC PANEL     Status: Abnormal   Collection Time    03/15/14 11:12 AM      Result Value Ref Range   Sodium 142  137 - 147 mEq/L   Potassium 3.8  3.7 - 5.3 mEq/L   Chloride 108  96 - 112 mEq/L   CO2 24  19 - 32 mEq/L   Glucose, Bld 99  70 - 99 mg/dL   BUN 8  6 - 23 mg/dL   Creatinine, Ser 1.00  0.50 - 1.10 mg/dL   Calcium 9.1  8.4 - 10.5 mg/dL   Total Protein 6.3  6.0 - 8.3 g/dL   Albumin 3.4 (*) 3.5 - 5.2 g/dL   AST 18  0 - 37 U/L   Comment: HEMOLYSIS AT THIS LEVEL MAY AFFECT RESULT   ALT 13  0 - 35 U/L   Alkaline Phosphatase 57  39 - 117 U/L   Total Bilirubin 0.8  0.3 - 1.2 mg/dL   GFR calc non Af Amer 72 (*) >90 mL/min  GFR calc Af Amer 84 (*) >90 mL/min   Comment: (NOTE)     The eGFR has been calculated using the CKD EPI equation.     This calculation has not been validated in all clinical situations.     eGFR's persistently <90 mL/min signify possible Chronic Kidney     Disease.   Anion gap 10  5 - 15  ETHANOL     Status: None   Collection Time    03/15/14 11:12 AM      Result Value Ref Range   Alcohol, Ethyl (B) <11  0 - 11 mg/dL   Comment:            LOWEST DETECTABLE LIMIT FOR     SERUM ALCOHOL IS 11 mg/dL     FOR MEDICAL PURPOSES ONLY  ACETAMINOPHEN LEVEL     Status: None   Collection Time    03/15/14 11:12 AM      Result Value Ref Range   Acetaminophen (Tylenol), Serum <15.0  10 - 30 ug/mL   Comment:            THERAPEUTIC CONCENTRATIONS VARY     SIGNIFICANTLY. A RANGE OF 10-30     ug/mL MAY BE AN EFFECTIVE     CONCENTRATION FOR MANY PATIENTS.     HOWEVER, SOME ARE BEST TREATED     AT CONCENTRATIONS OUTSIDE THIS     RANGE.     ACETAMINOPHEN CONCENTRATIONS     >150 ug/mL AT 4 HOURS AFTER     INGESTION AND >50 ug/mL AT 12     HOURS AFTER INGESTION ARE     OFTEN ASSOCIATED WITH TOXIC     REACTIONS.  SALICYLATE LEVEL     Status: Abnormal   Collection Time    03/15/14 11:12 AM      Result Value Ref Range   Salicylate Lvl <2.5 (*) 2.8 - 20.0 mg/dL   HCG, QUANTITATIVE, PREGNANCY     Status: None   Collection Time    03/15/14 11:12 AM      Result Value Ref Range   hCG, Beta Chain, Quant, S <1  <5 mIU/mL   Comment:              GEST. AGE      CONC.  (mIU/mL)       <=1 WEEK        5 - 50         2 WEEKS       50 - 500         3 WEEKS       100 - 10,000         4 WEEKS     1,000 - 30,000         5 WEEKS     3,500 - 115,000       6-8 WEEKS     12,000 - 270,000        12 WEEKS     15,000 - 220,000                FEMALE AND NON-PREGNANT FEMALE:         LESS THAN 5 mIU/mL  T4, FREE     Status: None   Collection Time    03/16/14  8:38 PM      Result Value Ref Range   Free T4 1.17  0.80 - 1.80 ng/dL   Comment: Performed at [facility] are reviewed and  are pertinent for no medical issues noted.  Current Facility-Administered Medications  Medication Dose Route Frequency Provider Last Rate Last Dose  . ALPRAZolam Duanne Moron) tablet 1 mg  1 mg Oral QHS PRN Carmin Muskrat, MD      . furosemide (LASIX) tablet 20 mg  20 mg Oral Daily Carmin Muskrat, MD      . haloperidol (HALDOL) tablet 5 mg  5 mg Oral BID Waylan Boga, NP   5 mg at 03/17/14 1051  . lisinopril (PRINIVIL,ZESTRIL) tablet 20 mg  20 mg Oral Daily Carmin Muskrat, MD   20 mg at 03/15/14 1814  . medroxyPROGESTERone (PROVERA) tablet 10 mg  10 mg Oral Daily Carmin Muskrat, MD   10 mg at 03/17/14 1052  . propylthiouracil (PTU) tablet 50 mg  50 mg Oral Daily Carmin Muskrat, MD   50 mg at 03/17/14 1051  . traMADol (ULTRAM) tablet 100 mg  100 mg Oral Q6H PRN Carmin Muskrat, MD      . ziprasidone (GEODON) injection 20 mg  20 mg Intramuscular Q6H PRN Carmin Muskrat, MD   20 mg at 03/16/14 8938   Current Outpatient Prescriptions  Medication Sig Dispense Refill  . ALPRAZolam (XANAX) 1 MG tablet Take 1 tablet (1 mg total) by mouth at bedtime as needed for anxiety.  10 tablet  0  . furosemide (LASIX) 20 MG tablet Take 1 tablet (20 mg total) by mouth daily.  10 tablet  0   . haloperidol (HALDOL) 5 MG tablet Take 5 mg by mouth at bedtime as needed and may repeat dose one time if needed for agitation.      Marland Kitchen ibuprofen (ADVIL,MOTRIN) 600 MG tablet Take 1 tablet (600 mg total) by mouth 4 (four) times daily.  30 tablet  0  . lisinopril (PRINIVIL,ZESTRIL) 20 MG tablet Take 20 mg by mouth daily.      . medroxyPROGESTERone (PROVERA) 10 MG tablet Take 1 tablet (10 mg total) by mouth daily.  30 tablet  0  . oxyCODONE-acetaminophen (PERCOCET) 5-325 MG per tablet Take 1 tablet by mouth every 4 (four) hours as needed.  10 tablet  0  . propylthiouracil (PTU) 50 MG tablet Take 50 mg by mouth 3 (three) times daily.      . traMADol (ULTRAM) 50 MG tablet Take 100 mg by mouth every 6 (six) hours as needed for moderate pain.      . valACYclovir (VALTREX) 500 MG tablet Take 500 mg by mouth daily.        Psychiatric Specialty Exam:     Blood pressure 110/92, pulse 102, temperature 98.5 F (36.9 C), temperature source Oral, resp. rate 20, SpO2 99.00%, not currently breastfeeding.There is no weight on file to calculate BMI.  General Appearance: Disheveled  Eye Sport and exercise psychologist::  Fair  Speech:  Clear and Coherent and Normal Rate  Volume:  Normal  Mood:  Anxious  Affect:  Labile  Thought Process:  Circumstantial and Goal Directed  Orientation:  Full (Time, Place, and Person)  Thought Content:  Delusions and Hallucinations: Auditory  Suicidal Thoughts:  No  Homicidal Thoughts:  No  Memory:  Immediate;   Poor Recent;   Poor Remote;   Fair  Judgement:  Impaired  Insight:  Lacking  Psychomotor Activity:  Increased  Concentration:  Fair  Recall:  [place]: Fair  Akathisia:  No  Handed:  Right  AIMS (if indicated):     Assets:  Leisure Time Physical Health Resilience Social Support  Sleep:  Musculoskeletal: Strength & Muscle Tone: within normal limits Gait & Station: normal Patient leans: N/A  Treatment Plan Summary: Daily contact with  patient to assess and evaluate symptoms and progress in treatment Medication management; admit to inpatient psychiatric unit for stabilization  Rankin, Shuvon, FNP-BC  03/17/2014 1:57 PM  Patient seen and evaluated with NP as above

## 2014-03-18 ENCOUNTER — Encounter (HOSPITAL_COMMUNITY): Payer: Self-pay | Admitting: Psychiatry

## 2014-03-18 DIAGNOSIS — F3110 Bipolar disorder, current episode manic without psychotic features, unspecified: Secondary | ICD-10-CM | POA: Diagnosis not present

## 2014-03-18 NOTE — BH Assessment (Signed)
Made 3 attempts to call patient's ACT provider-Eddie #[phone]. Writer unable to reach provider as it appears to be issues with the phone. Writer will continue to try reaching ACT provider.

## 2014-03-18 NOTE — BH Assessment (Addendum)
03/18/2014  Discharge home per Dr. Jannifer Franklin and Nanine Means, NP. Follow up out patient referrals provided for patient.

## 2014-03-18 NOTE — Consult Note (Signed)
[facility] Face-to-Face Psychiatry Consult   Reason for Consult:  Psychosis  Referring Physician:  EDP  [name] is an 38 y.o. female. Total Time spent with patient: 20 minutes  Assessment: AXIS I:  Bipolar, Manic AXIS II:  Deferred AXIS III:   Past Medical History  Diagnosis Date  . Sinus tachycardia   . HTN (hypertension)   . Arm pain   . Eye globe prosthesis   . CHF (congestive heart failure)   . Bipolar affective disorder, currently manic, mild   . Hyperthyroidism    AXIS IV:  other psychosocial or environmental problems, problems related to social environment and problems with primary support group AXIS V:  70; mild symptoms  Plan:  Discharge home with follow-up with her ACT team and providers at [facility].  Dr. Jannifer Cobb assessed the patient and concurs with the plan.  Subjective:   Christine Cobb is a 38 y.o. female patient does not warrant admission.  HPI:  Patient has stabilized, no longer manic, sleeping well, no pressured speech or lability of mood.  Denies suicidal/homicidal ideations, hallucinations, and alcohol/drug use.  She stated she just needed a bus pass and some shoes.  HPI Elements:   Location:  generalized. Quality:  acute. Severity:  severe. Timing:  constant. Duration:  past few days. Context:  stressors, not taking her medications.  Past Psychiatric History: Past Medical History  Diagnosis Date  . Sinus tachycardia   . HTN (hypertension)   . Arm pain   . Eye globe prosthesis   . CHF (congestive heart failure)   . Bipolar affective disorder, currently manic, mild   . Hyperthyroidism     reports that she has been smoking Cigarettes.  She has been smoking about 1.00 pack per day. She has never used smokeless tobacco. She reports that she uses illicit drugs (Marijuana). She reports that she does not drink alcohol. Family History  Problem Relation Age of Onset  . Hypertension Other   . Emphysema Other   . Asthma Son   . Diabetes Maternal  Uncle   . Diabetes Paternal Grandmother    Family History Substance Abuse: No Family Supports: Yes, List: (Husband) Living Arrangements: Spouse/significant other;Children Can pt return to current living arrangement?: Yes Abuse/Neglect [facility]) Physical Abuse: Denies Verbal Abuse: Denies Sexual Abuse: Denies Allergies:   Allergies  Allergen Reactions  . Amoxil [Amoxicillin] Anaphylaxis  . Trazodone And Nefazodone Anaphylaxis  . Ketorolac Tromethamine Hives and Swelling  . Prenatal [B-Plex Plus] Nausea Only  . Tegretol [Carbamazepine] Other (See Comments)    Swelling, skin peeling, skin discoloration    ACT Assessment Complete:  Yes:    Educational Status    Risk to Self: Risk to self with the past 6 months Suicidal Ideation: No Suicidal Intent: No Is patient at risk for suicide?: No Suicidal Plan?: No Access to Means: No What has been your use of drugs/alcohol within the last 12 months?: No alcohol or drug use reported Previous Attempts/Gestures: No How many times?: 0 Other Self Harm Risks: No other self harm risk identified at this time.  Triggers for Past Attempts: None known Intentional Self Injurious Behavior: None Family Suicide History: No Recent stressful life event(s):  (No stressful life events reported at this time) Persecutory voices/beliefs?: No Depression: Yes Depression Symptoms: Feeling angry/irritable Substance abuse history and/or treatment for substance abuse?: Yes Suicide prevention information given to non-admitted patients: Not applicable  Risk to Others: Risk to Others within the past 6 months Homicidal Ideation: No-Not Currently/Within Last  6 Months Current Homicidal Plan: No Access to Homicidal Means: No Identified Victim: NA History of harm to others?: No Assessment of Violence: None Noted Violent Behavior Description: No violent behaviors observed at this time. Pt is calm and cooperative. Does patient have access to weapons?: No (Pt stated "I  am a weapon". ) Criminal Charges Pending?: No Does patient have a court date: No  Abuse: Abuse/Neglect Assessment (Assessment to be complete while patient is alone) Physical Abuse: Denies Verbal Abuse: Denies Sexual Abuse: Denies Exploitation of patient/patient's resources: Denies Self-Neglect: Denies  Prior Inpatient Therapy: Prior Inpatient Therapy Prior Inpatient Therapy: Yes Prior Therapy Dates: 2014 Prior Therapy Facilty/Provider(s): [facility]  Reason for Treatment: Bipolar   Prior Outpatient Therapy: Prior Outpatient Therapy Prior Outpatient Therapy: Yes Prior Therapy Dates: 2015 Prior Therapy Facilty/Provider(s): [facility] Reason for Treatment: Bipolar  Additional Information: Additional Information 1:1 In Past 12 Months?: Yes CIRT Risk: No Elopement Risk: No Does patient have medical clearance?: Yes                  Objective: Blood pressure 133/81, pulse 50, temperature 99 F (37.2 C), temperature source Oral, resp. rate 18, SpO2 100.00%, not currently breastfeeding.There is no weight on file to calculate BMI. Results for orders placed during the hospital encounter of 03/15/14 (from the past 72 hour(s))  T4, FREE     Status: None   Collection Time    03/16/14  8:38 PM      Result Value Ref Range   Free T4 1.17  0.80 - 1.80 ng/dL   Comment: Performed at [facility]  URINALYSIS, ROUTINE W REFLEX MICROSCOPIC     Status: None   Collection Time    03/17/14  4:15 PM      Result Value Ref Range   Color, Urine YELLOW  YELLOW   APPearance CLEAR  CLEAR   Specific Gravity, Urine 1.006  1.005 - 1.030   pH 7.0  5.0 - 8.0   Glucose, UA NEGATIVE  NEGATIVE mg/dL   Hgb urine dipstick NEGATIVE  NEGATIVE   Bilirubin Urine NEGATIVE  NEGATIVE   Ketones, ur NEGATIVE  NEGATIVE mg/dL   Protein, ur NEGATIVE  NEGATIVE mg/dL   Urobilinogen, UA 0.2  0.0 - 1.0 mg/dL   Nitrite NEGATIVE  NEGATIVE   Leukocytes, UA NEGATIVE  NEGATIVE   Comment: MICROSCOPIC NOT  DONE ON URINES WITH NEGATIVE PROTEIN, BLOOD, LEUKOCYTES, NITRITE, OR GLUCOSE <1000 mg/dL.  PREGNANCY, URINE     Status: None   Collection Time    03/17/14  4:15 PM      Result Value Ref Range   Preg Test, Ur NEGATIVE  NEGATIVE   Comment:            THE SENSITIVITY OF THIS     METHODOLOGY IS >20 mIU/mL.  URINE RAPID DRUG SCREEN (HOSP PERFORMED)     Status: None   Collection Time    03/17/14  4:15 PM      Result Value Ref Range   Opiates NONE DETECTED  NONE DETECTED   Cocaine NONE DETECTED  NONE DETECTED   Benzodiazepines NONE DETECTED  NONE DETECTED   Amphetamines NONE DETECTED  NONE DETECTED   Tetrahydrocannabinol NONE DETECTED  NONE DETECTED   Barbiturates NONE DETECTED  NONE DETECTED   Comment:            DRUG SCREEN FOR MEDICAL PURPOSES     ONLY.  IF CONFIRMATION IS NEEDED     FOR ANY PURPOSE, NOTIFY  LAB     WITHIN 5 DAYS.                LOWEST DETECTABLE LIMITS     FOR URINE DRUG SCREEN     Drug Class       Cutoff (ng/mL)     Amphetamine      1000     Barbiturate      200     Benzodiazepine   200     Tricyclics       300     Opiates          300     Cocaine          300     THC              50   Labs are reviewed and are pertinent for no medical issues noted.  Current Facility-Administered Medications  Medication Dose Route Frequency Provider Last Rate Last Dose  . ALPRAZolam Prudy Feeler) tablet 1 mg  1 mg Oral QHS PRN Gerhard Munch, MD      . furosemide (LASIX) tablet 20 mg  20 mg Oral Daily Gerhard Munch, MD      . haloperidol (HALDOL) tablet 5 mg  5 mg Oral BID Nanine Means, NP   5 mg at 03/18/14 0933  . lisinopril (PRINIVIL,ZESTRIL) tablet 20 mg  20 mg Oral Daily Gerhard Munch, MD   20 mg at 03/15/14 1814  . medroxyPROGESTERone (PROVERA) tablet 10 mg  10 mg Oral Daily Gerhard Munch, MD   10 mg at 03/18/14 0932  . propylthiouracil (PTU) tablet 50 mg  50 mg Oral Daily Gerhard Munch, MD   50 mg at 03/18/14 0932  . traMADol (ULTRAM) tablet 100 mg  100 mg Oral  Q6H PRN Gerhard Munch, MD      . ziprasidone (GEODON) injection 20 mg  20 mg Intramuscular Q6H PRN Gerhard Munch, MD   20 mg at 03/16/14 1610   Current Outpatient Prescriptions  Medication Sig Dispense Refill  . ALPRAZolam (XANAX) 1 MG tablet Take 1 tablet (1 mg total) by mouth at bedtime as needed for anxiety.  10 tablet  0  . furosemide (LASIX) 20 MG tablet Take 1 tablet (20 mg total) by mouth daily.  10 tablet  0  . haloperidol (HALDOL) 5 MG tablet Take 5 mg by mouth at bedtime as needed and may repeat dose one time if needed for agitation.      Marland Kitchen ibuprofen (ADVIL,MOTRIN) 600 MG tablet Take 1 tablet (600 mg total) by mouth 4 (four) times daily.  30 tablet  0  . lisinopril (PRINIVIL,ZESTRIL) 20 MG tablet Take 20 mg by mouth daily.      . medroxyPROGESTERone (PROVERA) 10 MG tablet Take 1 tablet (10 mg total) by mouth daily.  30 tablet  0  . oxyCODONE-acetaminophen (PERCOCET) 5-325 MG per tablet Take 1 tablet by mouth every 4 (four) hours as needed.  10 tablet  0  . propylthiouracil (PTU) 50 MG tablet Take 50 mg by mouth 3 (three) times daily.      . traMADol (ULTRAM) 50 MG tablet Take 100 mg by mouth every 6 (six) hours as needed for moderate pain.      . valACYclovir (VALTREX) 500 MG tablet Take 500 mg by mouth daily.        Psychiatric Specialty Exam:     Blood pressure 133/81, pulse 50, temperature 99 F (37.2 C), temperature source Oral, resp. rate 18, SpO2 100.00%,  not currently breastfeeding.There is no weight on file to calculate BMI.  General Appearance: Casual  Eye Contact::  Good  Speech:  Normal  Volume:  Normal  Mood:  Euthymic  Affect:  Congruent  Thought Process:  Logical, clear, coherent  Orientation:  Full (Time, Place, and Person)  Thought Content:  WDL  Suicidal Thoughts:  No  Homicidal Thoughts:  No  Memory:  Good  Judgement:  Fair  Insight:  Fair  Psychomotor Activity:  Normal  Concentration:  Normal  Recall: Good  Fund of Knowledge:  Good  Language:  Good  Akathisia:  No  Handed:  Right  AIMS (if indicated):     Assets:  Leisure Time Physical Health Resilience Social Support  Sleep:      Musculoskeletal: Strength & Muscle Tone: within normal limits Gait & Station: normal Patient leans: N/A  Treatment Plan Summary: Discharge home with follow-up with her ACT team and providers at [facility], Rx given, and a bus pas.  Nanine Means, PMH-NP 03/18/2014 1:25 PM  Patient seen, evaluated and I agree with notes by Nurse Practitioner. Thedore Mins, MD

## 2014-03-18 NOTE — [facility] Suicide Risk Assessment (Signed)
Suicide Risk Assessment  Discharge Assessment     Demographic Factors:  Unemployed  Total Time spent with patient: 20 minutes   PsyPsychiatric Specialty Exam:     Blood pressure 133/81, pulse 50, temperature 99 F (37.2 C), temperature source Oral, resp. rate 18, SpO2 100.00%, not currently breastfeeding.There is no weight on file to calculate BMI.  General Appearance: Casual  Eye Contact::  Good  Speech:  Normal  Volume:  Normal  Mood:  Euthymic  Affect:  Congruent  Thought Process:  Logical, clear, coherent  Orientation:  Full (Time, Place, and Person)  Thought Content:  WDL  Suicidal Thoughts:  No  Homicidal Thoughts:  No  Memory:  Good  Judgement:  Fair  Insight:  Fair  Psychomotor Activity:  Normal  Concentration:  Normal  Recall: Good  Fund of Knowledge:  Good  Language: Good  Akathisia:  No  Handed:  Right  AIMS (if indicated):     Assets:  Leisure Time Physical Health Resilience Social Support  Sleep:      Musculoskeletal: Strength & Muscle Tone: within normal limits Gait & Station: normal Patient leans: N/A  Mental Status Per Nursing Assessment::   On Admission:   Manic  Current Mental Status by Physician: NA  Loss Factors: NA  Historical Factors: NA  Risk Reduction Factors:   Responsible for children under 3 years of age, Sense of responsibility to family and Positive therapeutic relationship  Continued Clinical Symptoms:  None  Cognitive Features That Contribute To Risk:  None  Suicide Risk:  Minimal: No identifiable suicidal ideation.  Patients presenting with no risk factors but with morbid ruminations; may be classified as minimal risk based on the severity of the depressive symptoms  Discharge Diagnoses:   AXIS I:  Bipolar, Manic AXIS II:  Deferred AXIS III:   Past Medical History  Diagnosis Date  . Sinus tachycardia   . HTN (hypertension)   . Arm pain   . Eye globe prosthesis   . CHF (congestive heart failure)   .  Bipolar affective disorder, currently manic, mild   . Hyperthyroidism    AXIS IV:  other psychosocial or environmental problems, problems related to social environment and problems with primary support group AXIS V:  61-70 mild symptoms  Plan Of Care/Follow-up recommendations:  Activity:  as tolerated Diet:  low-sodium heart healthy diet  Is patient on multiple antipsychotic therapies at discharge:  No   Has Patient had three or more failed trials of antipsychotic monotherapy by history:  No  Recommended Plan for Multiple Antipsychotic Therapies: NA    LORD, JAMISON, PMH-NP 03/18/2014, 1:31 PM

## 2014-03-18 NOTE — Progress Notes (Signed)
MHT completed follow up at the following psychiatric facilities with bed availability:  1)[facility]-under review 2)[facility]-no answer 3)[facility]-under review 4)[facility]-under review 5)[facility]-on waitlist 6)[facility]-under review 7)[facility]-on waitlist  Blain Pais, MHT/NS

## 2014-03-18 NOTE — ED Notes (Signed)
Patient has stayed in her room most of the day.  Denies thoughts of suicide, self harm, or harm to others.  Denies hallucinations of any type.  Discharged to home with bus pass and list of therapists in the area.  Left the unit ambulatory with tech.  States she feels better than when she came into the hospital and feels ready to leave.  All belongings returned.

## 2014-04-29 ENCOUNTER — Encounter (HOSPITAL_COMMUNITY): Payer: Self-pay | Admitting: Psychiatry

## 2014-06-01 ENCOUNTER — Emergency Department (HOSPITAL_COMMUNITY)
Admission: EM | Admit: 2014-06-01 | Discharge: 2014-06-01 | Disposition: A | Discharge status: Home / Self Care | Payer: Medicare Other | Attending: Emergency Medicine | Admitting: Emergency Medicine

## 2014-06-01 ENCOUNTER — Other Ambulatory Visit: Payer: Self-pay

## 2014-06-01 DIAGNOSIS — I10 Essential (primary) hypertension: Secondary | ICD-10-CM | POA: Diagnosis not present

## 2014-06-01 DIAGNOSIS — R55 Syncope and collapse: Secondary | ICD-10-CM | POA: Insufficient documentation

## 2014-06-01 DIAGNOSIS — Z720 Tobacco use: Secondary | ICD-10-CM | POA: Insufficient documentation

## 2014-06-01 DIAGNOSIS — R404 Transient alteration of awareness: Secondary | ICD-10-CM | POA: Diagnosis not present

## 2014-06-01 DIAGNOSIS — I509 Heart failure, unspecified: Secondary | ICD-10-CM | POA: Diagnosis not present

## 2014-06-01 DIAGNOSIS — Z79899 Other long term (current) drug therapy: Secondary | ICD-10-CM | POA: Insufficient documentation

## 2014-06-01 DIAGNOSIS — Z3202 Encounter for pregnancy test, result negative: Secondary | ICD-10-CM | POA: Insufficient documentation

## 2014-06-01 DIAGNOSIS — Z8739 Personal history of other diseases of the musculoskeletal system and connective tissue: Secondary | ICD-10-CM | POA: Diagnosis not present

## 2014-06-01 DIAGNOSIS — E0590 Thyrotoxicosis, unspecified without thyrotoxic crisis or storm: Secondary | ICD-10-CM | POA: Insufficient documentation

## 2014-06-01 DIAGNOSIS — F1410 Cocaine abuse, uncomplicated: Secondary | ICD-10-CM | POA: Diagnosis not present

## 2014-06-01 DIAGNOSIS — F319 Bipolar disorder, unspecified: Secondary | ICD-10-CM

## 2014-06-01 DIAGNOSIS — R42 Dizziness and giddiness: Secondary | ICD-10-CM | POA: Diagnosis not present

## 2014-06-01 DIAGNOSIS — F3170 Bipolar disorder, currently in remission, most recent episode unspecified: Secondary | ICD-10-CM | POA: Diagnosis not present

## 2014-06-01 DIAGNOSIS — F1210 Cannabis abuse, uncomplicated: Secondary | ICD-10-CM | POA: Insufficient documentation

## 2014-06-01 DIAGNOSIS — F1910 Other psychoactive substance abuse, uncomplicated: Secondary | ICD-10-CM

## 2014-06-01 LAB — BASIC METABOLIC PANEL
Anion gap: 15 (ref 5–15)
BUN: 14 mg/dL (ref 6–23)
CHLORIDE: 100 meq/L (ref 96–112)
CO2: 23 mEq/L (ref 19–32)
Calcium: 9.4 mg/dL (ref 8.4–10.5)
Creatinine, Ser: 1.28 mg/dL — ABNORMAL HIGH (ref 0.50–1.10)
GFR, EST AFRICAN AMERICAN: 62 mL/min — AB (ref >90)
GFR, EST NON AFRICAN AMERICAN: 53 mL/min — AB (ref >90)
Glucose, Bld: 129 mg/dL — ABNORMAL HIGH (ref 70–99)
Potassium: 3.5 mEq/L — ABNORMAL LOW (ref 3.7–5.3)
SODIUM: 138 meq/L (ref 137–147)

## 2014-06-01 LAB — URINALYSIS, ROUTINE W REFLEX MICROSCOPIC
Glucose, UA: NEGATIVE mg/dL
HGB URINE DIPSTICK: NEGATIVE
Ketones, ur: NEGATIVE mg/dL
Nitrite: NEGATIVE
Protein, ur: NEGATIVE mg/dL
SPECIFIC GRAVITY, URINE: 1.017 (ref 1.005–1.030)
Urobilinogen, UA: 0.2 mg/dL (ref 0.0–1.0)
pH: 5.5 (ref 5.0–8.0)

## 2014-06-01 LAB — CBC WITH DIFFERENTIAL/PLATELET
Basophils Absolute: 0.1 10*3/uL (ref 0.0–0.1)
Basophils Relative: 1 % (ref 0–1)
EOS ABS: 0.1 10*3/uL (ref 0.0–0.7)
Eosinophils Relative: 1 % (ref 0–5)
HCT: 42.2 % (ref 36.0–46.0)
Hemoglobin: 14.1 g/dL (ref 12.0–15.0)
LYMPHS ABS: 1.2 10*3/uL (ref 0.7–4.0)
Lymphocytes Relative: 19 % (ref 12–46)
MCH: 28.6 pg (ref 26.0–34.0)
MCHC: 33.4 g/dL (ref 30.0–36.0)
MCV: 85.6 fL (ref 78.0–100.0)
Monocytes Absolute: 0.5 10*3/uL (ref 0.1–1.0)
Monocytes Relative: 8 % (ref 3–12)
NEUTROS ABS: 4.6 10*3/uL (ref 1.7–7.7)
NEUTROS PCT: 71 % (ref 43–77)
PLATELETS: 234 10*3/uL (ref 150–400)
RBC: 4.93 MIL/uL (ref 3.87–5.11)
RDW: 13.1 % (ref 11.5–15.5)
WBC: 6.4 10*3/uL (ref 4.0–10.5)

## 2014-06-01 LAB — URINE MICROSCOPIC-ADD ON

## 2014-06-01 LAB — RAPID URINE DRUG SCREEN, HOSP PERFORMED
AMPHETAMINES: NOT DETECTED
BENZODIAZEPINES: NOT DETECTED
Barbiturates: NOT DETECTED
Cocaine: POSITIVE — AB
OPIATES: NOT DETECTED
Tetrahydrocannabinol: POSITIVE — AB

## 2014-06-01 LAB — ETHANOL

## 2014-06-01 LAB — HCG, QUANTITATIVE, PREGNANCY

## 2014-06-01 LAB — POC URINE PREG, ED: Preg Test, Ur: NEGATIVE

## 2014-06-01 LAB — ABO/RH: ABO/RH(D): O NEG

## 2014-06-01 MED ORDER — SODIUM CHLORIDE 0.9 % IV BOLUS (SEPSIS)
1000.0000 mL | Freq: Once | INTRAVENOUS | Status: AC
  Administered 2014-06-01: 1000 mL via INTRAVENOUS

## 2014-06-01 NOTE — ED Notes (Signed)
Patient tried to urinate and couldn't, she will call when she can go

## 2014-06-01 NOTE — ED Provider Notes (Signed)
CSN: [id number]     Arrival date & time 06/01/14  1817 History   First MD Initiated Contact with Patient 06/01/14 1822     Chief Complaint  Patient presents with  . Alcohol Intoxication  . Near Syncope     (Consider location/radiation/quality/duration/timing/severity/associated sxs/prior Treatment) Patient is a 38 y.o. female presenting with intoxication and near-syncope. The history is provided by the patient. The history is limited by the condition of the patient (intoxication). No language interpreter was used.  Alcohol Intoxication This is a new problem. The current episode started 1 to 2 hours ago. The problem occurs constantly. The problem has not changed since onset.Pertinent negatives include no chest pain, no abdominal pain, no headaches and no shortness of breath. Nothing aggravates the symptoms. Nothing relieves the symptoms. She has tried rest for the symptoms. The treatment provided mild relief.  Near Syncope This is a new problem. The current episode started 1 to 2 hours ago. The problem occurs rarely. The problem has been rapidly improving. Pertinent negatives include no chest pain, no abdominal pain, no headaches and no shortness of breath. Nothing aggravates the symptoms. Nothing relieves the symptoms. She has tried rest for the symptoms. The treatment provided mild relief.    Past Medical History  Diagnosis Date  . Sinus tachycardia   . HTN (hypertension)   . Arm pain   . Eye globe prosthesis   . CHF (congestive heart failure)   . Bipolar affective disorder, currently manic, mild   . Hyperthyroidism    Past Surgical History  Procedure Laterality Date  . Eye surgery    . Dilation and curettage of uterus     Family History  Problem Relation Age of Onset  . Hypertension Other   . Emphysema Other   . Asthma Son   . Diabetes Maternal Uncle   . Diabetes Paternal Grandmother    History  Substance Use Topics  . Smoking status: Current Every Day Smoker -- 1.00  packs/day    Types: Cigarettes    Last Attempt to Quit: 08/21/2013  . Smokeless tobacco: Never Used  . Alcohol Use: No   OB History    Gravida Para Term Preterm AB TAB SAB Ectopic Multiple Living   3 2 2  1  0 1   2     Review of Systems  Constitutional: Negative for fever, chills, diaphoresis, activity change, appetite change and fatigue.  HENT: Negative for congestion, facial swelling, rhinorrhea and sore throat.   Eyes: Negative for photophobia and discharge.  Respiratory: Negative for cough, chest tightness and shortness of breath.   Cardiovascular: Positive for near-syncope. Negative for chest pain, palpitations and leg swelling.  Gastrointestinal: Negative for nausea, vomiting, abdominal pain and diarrhea.  Endocrine: Negative for polydipsia and polyuria.  Genitourinary: Negative for dysuria, frequency, difficulty urinating and pelvic pain.  Musculoskeletal: Negative for back pain, arthralgias, neck pain and neck stiffness.  Skin: Negative for color change and wound.  Allergic/Immunologic: Negative for immunocompromised state.  Neurological: Positive for syncope (vs near syncope) and light-headedness. Negative for facial asymmetry, weakness, numbness and headaches.  Hematological: Does not bruise/bleed easily.  Psychiatric/Behavioral: Negative for confusion and agitation.      Allergies  Amoxil; Trazodone and nefazodone; Ketorolac tromethamine; Prenatal; and Tegretol  Home Medications   Prior to Admission medications   Medication Sig Start Date End Date Taking? Authorizing Provider  ALPRAZolam Prudy Feeler) 1 MG tablet Take 1 tablet (1 mg total) by mouth at bedtime as needed for anxiety. 01/15/14  Yes Vida RollerBrian D Miller, MD  haloperidol (HALDOL) 5 MG tablet Take 5 mg by mouth daily.    Yes Historical Provider, MD  traMADol (ULTRAM) 50 MG tablet Take 100 mg by mouth every 6 (six) hours as needed for moderate pain.   Yes Historical Provider, MD  valACYclovir (VALTREX) 500 MG tablet  Take 500 mg by mouth daily.   Yes Historical Provider, MD  furosemide (LASIX) 20 MG tablet Take 1 tablet (20 mg total) by mouth daily. Patient not taking: Reported on 06/01/2014 01/15/14   Vida RollerBrian D Miller, MD  ibuprofen (ADVIL,MOTRIN) 600 MG tablet Take 1 tablet (600 mg total) by mouth 4 (four) times daily. Patient not taking: Reported on 06/01/2014 09/04/13   Jamal CollinJames R Joyner, MD  lisinopril (PRINIVIL,ZESTRIL) 20 MG tablet Take 20 mg by mouth daily.    Historical Provider, MD  medroxyPROGESTERone (PROVERA) 10 MG tablet Take 1 tablet (10 mg total) by mouth daily. Patient not taking: Reported on 06/01/2014 11/27/13   Marny LowensteinJulie N Wenzel, PA-C  oxyCODONE-acetaminophen (PERCOCET) 5-325 MG per tablet Take 1 tablet by mouth every 4 (four) hours as needed. Patient not taking: Reported on 06/01/2014 01/15/14   Vida RollerBrian D Miller, MD  propylthiouracil (PTU) 50 MG tablet Take 50 mg by mouth 3 (three) times daily.    Historical Provider, MD   BP 100/84 mmHg  Pulse 102  Temp(Src) 98.2 F (36.8 C) (Oral)  Resp 20  SpO2 100% Physical Exam  Constitutional: She is oriented to person, place, and time. She appears well-developed and well-nourished. No distress.  Listless, appears intoxicated  HENT:  Head: Normocephalic and atraumatic.  Mouth/Throat: No oropharyngeal exudate.  Eyes: Pupils are equal, round, and reactive to light.  Neck: Normal range of motion. Neck supple.  Cardiovascular: Normal rate, regular rhythm and normal heart sounds.  Exam reveals no gallop and no friction rub.   No murmur heard. Pulmonary/Chest: Effort normal and breath sounds normal. No respiratory distress. She has no wheezes. She has no rales.  Abdominal: Soft. Bowel sounds are normal. She exhibits no distension and no mass. There is no tenderness. There is no rebound and no guarding.  Musculoskeletal: Normal range of motion. She exhibits no edema or tenderness.  Neurological: She is alert and oriented to person, place, and time. She has normal  strength. She displays no tremor. No cranial nerve deficit or sensory deficit. She exhibits normal muscle tone. Coordination normal. Abnormal gait: not tested due to low BP. GCS eye subscore is 4. GCS verbal subscore is 5. GCS motor subscore is 6.  Skin: Skin is warm and dry.  Psychiatric: She has a normal mood and affect.    ED Course  Procedures (including critical care time) Labs Review Labs Reviewed  BASIC METABOLIC PANEL - Abnormal; Notable for the following:    Potassium 3.5 (*)    Glucose, Bld 129 (*)    Creatinine, Ser 1.28 (*)    GFR calc non Af Amer 53 (*)    GFR calc Af Amer 62 (*)    All other components within normal limits  URINALYSIS, ROUTINE W REFLEX MICROSCOPIC - Abnormal; Notable for the following:    APPearance CLOUDY (*)    Bilirubin Urine SMALL (*)    Leukocytes, UA TRACE (*)    All other components within normal limits  URINE RAPID DRUG SCREEN (HOSP PERFORMED) - Abnormal; Notable for the following:    Cocaine POSITIVE (*)    Tetrahydrocannabinol POSITIVE (*)    All other components within normal  limits  URINE MICROSCOPIC-ADD ON - Abnormal; Notable for the following:    Bacteria, UA FEW (*)    All other components within normal limits  URINE CULTURE  CBC WITH DIFFERENTIAL  ETHANOL  HCG, QUANTITATIVE, PREGNANCY  POC URINE PREG, ED  POC URINE PREG, ED  ABO/RH    Imaging Review No results found.   EKG Interpretation None      MDM   Final diagnoses:  Near syncope  Polysubstance abuse  Bipolar affective disorder, most recent episode unspecified type, remission status unspecified    Pt is a 38 y.o. female with Pmhx as above who presents with syncopal/vs near syncopal episode at home just PTA while finxing cereal (first thing she tried to eat today). She admits to drinking several beers, smoking marijuana, and states she thinks she is two months pregnant but has not yet established care with an OB. She used cocaine as well this week. She also reports  chronic hallucinations which are worse when he doesn't take her bipolar meds, which she has not been doing lately, though she is here for the syncope. She denies SI/HI, cannot tell me if she is currently hallucinating. She denies abdominal pain, vaginal bleeding, has had some vaginal d/c. She appears intoxicated and is a poor historian at this point. Cardiopulmonary and neuro exam unremarkable. Abdominal exam benign. BP initially borderline low and was tachycardic, but improved after 1L NS. POC urine preg and serum preg are negative. EKG w/o acute ischemic changes or arrhythmia, CBC nml. BMP with mildly elevated Cr. ETOH neg. WHile waiting for results, pt decided she would like to leave. SHe initially agreed to wait for her UA, was given all other lab results and again denied SI/HI, but shortly after eloped from the dept. Urine not clearly infected.         Toy Cookey, MD 06/02/14 [id number]

## 2014-06-01 NOTE — ED Notes (Signed)
Bed: WA09 Expected date:  Expected time:  Means of arrival:  Comments: EMS- ETOH/Maryjane

## 2014-06-01 NOTE — ED Notes (Signed)
Pt took herself off cardiac monitor, states she feels better and doesn't want to be hooked up to monitor at this time.

## 2014-06-01 NOTE — ED Notes (Signed)
Pt informed that we are waiting for urine sample results to come back. Pt states she feels better and wants to go home. Pt left prior to being discharged. Dr Micheline Maze aware.

## 2014-06-01 NOTE — ED Notes (Signed)
Pt refused to have her vs rechecked.  Pt sts that since the monitor is already off she does not wish to put it back on in any way.

## 2014-06-01 NOTE — ED Notes (Signed)
Per EMS: Pt states whenever she stands she feels dizzy and feels like she's going to pass out, onset about an hour ago. Pt had fall today, no head trauma. Pt c/o pain to knees and ankles. Pt reports drinking unknown quantity of alcohol and doing marijuana today. States she did cocaine a few days ago. Pt 2 months pregnant. BP 76 palpated initially. In trendelenburg, came up to 97/67, pulse 102. A&O x 4. Sinus tach on 12-lead.

## 2014-06-03 LAB — URINE CULTURE
Colony Count: 75000
Special Requests: NORMAL

## 2014-07-04 ENCOUNTER — Emergency Department (HOSPITAL_COMMUNITY): Payer: Medicare Other

## 2014-07-04 ENCOUNTER — Encounter (HOSPITAL_COMMUNITY): Payer: Self-pay | Admitting: *Deleted

## 2014-07-04 ENCOUNTER — Emergency Department (HOSPITAL_COMMUNITY)
Admission: EM | Admit: 2014-07-04 | Discharge: 2014-07-06 | Disposition: A | Discharge status: Home / Self Care | Payer: Medicare Other | Attending: Emergency Medicine | Admitting: Emergency Medicine

## 2014-07-04 DIAGNOSIS — F29 Unspecified psychosis not due to a substance or known physiological condition: Secondary | ICD-10-CM | POA: Insufficient documentation

## 2014-07-04 DIAGNOSIS — E0590 Thyrotoxicosis, unspecified without thyrotoxic crisis or storm: Secondary | ICD-10-CM | POA: Diagnosis not present

## 2014-07-04 DIAGNOSIS — Z79899 Other long term (current) drug therapy: Secondary | ICD-10-CM | POA: Diagnosis not present

## 2014-07-04 DIAGNOSIS — Z3202 Encounter for pregnancy test, result negative: Secondary | ICD-10-CM | POA: Diagnosis not present

## 2014-07-04 DIAGNOSIS — I509 Heart failure, unspecified: Secondary | ICD-10-CM | POA: Diagnosis not present

## 2014-07-04 DIAGNOSIS — Z791 Long term (current) use of non-steroidal anti-inflammatories (NSAID): Secondary | ICD-10-CM | POA: Diagnosis not present

## 2014-07-04 DIAGNOSIS — F1310 Sedative, hypnotic or anxiolytic abuse, uncomplicated: Secondary | ICD-10-CM | POA: Insufficient documentation

## 2014-07-04 DIAGNOSIS — Z720 Tobacco use: Secondary | ICD-10-CM | POA: Insufficient documentation

## 2014-07-04 DIAGNOSIS — I10 Essential (primary) hypertension: Secondary | ICD-10-CM | POA: Diagnosis not present

## 2014-07-04 DIAGNOSIS — Z046 Encounter for general psychiatric examination, requested by authority: Secondary | ICD-10-CM | POA: Diagnosis present

## 2014-07-04 DIAGNOSIS — Z970 Presence of artificial eye: Secondary | ICD-10-CM | POA: Diagnosis not present

## 2014-07-04 DIAGNOSIS — F3110 Bipolar disorder, current episode manic without psychotic features, unspecified: Secondary | ICD-10-CM | POA: Diagnosis present

## 2014-07-04 LAB — CBC WITH DIFFERENTIAL/PLATELET
Basophils Absolute: 0.1 K/uL (ref 0.0–0.1)
Basophils Relative: 1 % (ref 0–1)
Eosinophils Absolute: 0.1 K/uL (ref 0.0–0.7)
Eosinophils Relative: 2 % (ref 0–5)
HCT: 36.1 % (ref 36.0–46.0)
Hemoglobin: 12.1 g/dL (ref 12.0–15.0)
Lymphocytes Relative: 35 % (ref 12–46)
Lymphs Abs: 2.1 K/uL (ref 0.7–4.0)
MCH: 29.2 pg (ref 26.0–34.0)
MCHC: 33.5 g/dL (ref 30.0–36.0)
MCV: 87.2 fL (ref 78.0–100.0)
Monocytes Absolute: 0.6 K/uL (ref 0.1–1.0)
Monocytes Relative: 11 % (ref 3–12)
Neutro Abs: 3 K/uL (ref 1.7–7.7)
Neutrophils Relative %: 51 % (ref 43–77)
Platelets: 263 K/uL (ref 150–400)
RBC: 4.14 MIL/uL (ref 3.87–5.11)
RDW: 15 % (ref 11.5–15.5)
WBC: 5.9 K/uL (ref 4.0–10.5)

## 2014-07-04 LAB — BASIC METABOLIC PANEL
Anion gap: 9 (ref 5–15)
BUN: 17 mg/dL (ref 6–23)
CALCIUM: 9 mg/dL (ref 8.4–10.5)
CO2: 22 mmol/L (ref 19–32)
Chloride: 107 mEq/L (ref 96–112)
Creatinine, Ser: 0.91 mg/dL (ref 0.50–1.10)
GFR calc Af Amer: >90 mL/min (ref >90)
GFR, EST NON AFRICAN AMERICAN: 80 mL/min — AB (ref >90)
GLUCOSE: 76 mg/dL (ref 70–99)
Potassium: 3.8 mmol/L (ref 3.5–5.1)
Sodium: 138 mmol/L (ref 135–145)

## 2014-07-04 LAB — I-STAT BETA HCG BLOOD, ED (MC, WL, AP ONLY)

## 2014-07-04 LAB — SALICYLATE LEVEL: Salicylate Lvl: <4 mg/dL (ref 2.8–20.0)

## 2014-07-04 LAB — ACETAMINOPHEN LEVEL: Acetaminophen (Tylenol), Serum: <10 ug/mL — ABNORMAL LOW (ref 10–30)

## 2014-07-04 MED ORDER — ONDANSETRON HCL 4 MG PO TABS: 4.0000 mg | ORAL_TABLET | Freq: Three times a day (TID) | ORAL | Status: DC | PRN

## 2014-07-04 MED ORDER — ZIPRASIDONE MESYLATE 20 MG IM SOLR: 20.0000 mg | Freq: Once | INTRAMUSCULAR | Status: DC

## 2014-07-04 MED ORDER — PROPYLTHIOURACIL 50 MG PO TABS
50.0000 mg | ORAL_TABLET | Freq: Three times a day (TID) | ORAL | Status: DC
  Administered 2014-07-05 – 2014-07-06 (×3): 50 mg via ORAL
  Filled 2014-07-04 (×6): qty 1

## 2014-07-04 MED ORDER — ZIPRASIDONE MESYLATE 20 MG IM SOLR
10.0000 mg | Freq: Once | INTRAMUSCULAR | Status: AC
Start: 2014-07-04 — End: 2014-07-04
  Administered 2014-07-04: 10 mg via INTRAMUSCULAR
  Filled 2014-07-04: qty 20

## 2014-07-04 MED ORDER — LISINOPRIL 20 MG PO TABS
20.0000 mg | ORAL_TABLET | Freq: Every day | ORAL | Status: DC
  Administered 2014-07-05 – 2014-07-06 (×2): 20 mg via ORAL
  Filled 2014-07-04 (×3): qty 1

## 2014-07-04 MED ORDER — STERILE WATER FOR INJECTION IJ SOLN
INTRAMUSCULAR | Status: AC
  Filled 2014-07-04: qty 10

## 2014-07-04 MED ORDER — ZOLPIDEM TARTRATE 5 MG PO TABS: 5.0000 mg | ORAL_TABLET | Freq: Every evening | ORAL | Status: DC | PRN

## 2014-07-04 MED ORDER — ZIPRASIDONE MESYLATE 20 MG IM SOLR: 10.0000 mg | Freq: Once | INTRAMUSCULAR | Status: DC

## 2014-07-04 MED ORDER — HALOPERIDOL 5 MG PO TABS
5.0000 mg | ORAL_TABLET | Freq: Every day | ORAL | Status: DC
  Administered 2014-07-05: 5 mg via ORAL
  Filled 2014-07-04: qty 1

## 2014-07-04 MED ORDER — ZIPRASIDONE MESYLATE 20 MG IM SOLR
10.0000 mg | Freq: Once | INTRAMUSCULAR | Status: AC
  Administered 2014-07-04: 10 mg via INTRAMUSCULAR
  Filled 2014-07-04: qty 20

## 2014-07-04 MED ORDER — VALACYCLOVIR HCL 500 MG PO TABS
500.0000 mg | ORAL_TABLET | Freq: Every day | ORAL | Status: DC
  Administered 2014-07-05: 500 mg via ORAL
  Filled 2014-07-04 (×2): qty 1

## 2014-07-04 MED ORDER — ZIPRASIDONE MESYLATE 20 MG IM SOLR
INTRAMUSCULAR | Status: AC
  Filled 2014-07-04: qty 20

## 2014-07-04 MED ORDER — NICOTINE 21 MG/24HR TD PT24
21.0000 mg | MEDICATED_PATCH | Freq: Every day | TRANSDERMAL | Status: DC
  Administered 2014-07-04: 21 mg via TRANSDERMAL
  Filled 2014-07-04: qty 1

## 2014-07-04 NOTE — ED Notes (Signed)
Pt brought in by sheriff's deputy in handcuffs with IVC papers, per deputy pt was just released from jail during the imprisonment time she refused to wear clothes, she was urinating and defecating on floor and smearing feces on herself and was "playing with it", deputy reports "county doctor tried to get her medicated, but they couldn't". Pt is screaming, cursing at staff upon arrival. She is handcuffed to the bed rails, however she is trying to roll over

## 2014-07-04 NOTE — BH Assessment (Addendum)
Tele Assessment Note   Christine Cobb is a 39 y.o. female who presents to [facility] via IVC petition, initiated by jail custodian.  This Clinical research associate attempted to interview pt, however she has been administered chemical restraints due to aggressive behavior and pt is sedated and sleeping.  The following information is collateral gathered from medical staff notes and IVC petition. Pt was brought in by the sheriff's deputy after recent release from jail during which time, she refused to wear clothing, she was urinating and defecating on the floor.  She was smearing on feces on herself and was "playing with it".  Per petition, the county physician attempted to medicate pt but was unable to. While incarcerated, pt was incoherent, babbling and screaming.  Upon arrival to [facility], pt was screaming and cursing at staff, pt was handcuffed to bed and trying to roll, she has flight of ideas and hypersexual, using vulgar language towards staff.  Pt was transferred to psych unit, she was refusing to walk on her own; she was yelling, screaming, she destroyed the hospital room with trash, taking her food tray and cups, wrapping them in her blanket and then stomping on them.  Pt came of out her room and urinated on the floor, stripped off her clothes and walked into the bathroom shower, emerging and hitting the glass at the nurses station and approaching female staff in a seductive manner.    Axis I: Psychotic Disorder NOS Axis II: Deferred Axis III:  Past Medical History  Diagnosis Date  . Sinus tachycardia   . HTN (hypertension)   . Arm pain   . Eye globe prosthesis   . CHF (congestive heart failure)   . Bipolar affective disorder, currently manic, mild   . Hyperthyroidism    Axis IV: other psychosocial or environmental problems, problems related to legal system/crime, problems related to social environment, problems with access to health care services and problems with primary support group Axis V: 21-30 behavior  considerably influenced by delusions or hallucinations OR serious impairment in judgment, communication OR inability to function in almost all areas  Past Medical History:  Past Medical History  Diagnosis Date  . Sinus tachycardia   . HTN (hypertension)   . Arm pain   . Eye globe prosthesis   . CHF (congestive heart failure)   . Bipolar affective disorder, currently manic, mild   . Hyperthyroidism     Past Surgical History  Procedure Laterality Date  . Eye surgery    . Dilation and curettage of uterus      Family History:  Family History  Problem Relation Age of Onset  . Hypertension Other   . Emphysema Other   . Asthma Son   . Diabetes Maternal Uncle   . Diabetes Paternal Grandmother     Social History:  reports that she has been smoking Cigarettes.  She has been smoking about 1.00 pack per day. She has never used smokeless tobacco. She reports that she uses illicit drugs (Marijuana). She reports that she does not drink alcohol.  Additional Social History:     CIWA: CIWA-Ar BP: 132/71 mmHg Pulse Rate: 94 COWS:    PATIENT STRENGTHS: (choose at least two) NA  Allergies:  Allergies  Allergen Reactions  . Amoxil [Amoxicillin] Anaphylaxis  . Trazodone And Nefazodone Anaphylaxis  . Ketorolac Tromethamine Hives and Swelling  . Prenatal [B-Plex Plus] Nausea Only  . Tegretol [Carbamazepine] Other (See Comments)    Swelling, skin peeling, skin discoloration    Home  Medications:  (Not in a hospital admission)  OB/GYN Status:  No LMP recorded.              Risk to self with the past 6 months Is patient at risk for suicide?: No, but patient needs Medical Clearance                                     Advance Directives (For Healthcare) Does patient have an advance directive?: No Would patient like information on creating an advanced directive?: No - patient declined information          Disposition:     Murrell Redden 07/04/2014 8:00 PM

## 2014-07-04 NOTE — ED Notes (Signed)
Bed: YIR48 Expected date:  Expected time:  Means of arrival:  Comments: Room 25

## 2014-07-04 NOTE — ED Provider Notes (Signed)
CSN: [id number]     Arrival date & time 07/04/14  1149 History   First MD Initiated Contact with Patient 07/04/14 1156     Chief Complaint  Patient presents with  . bizare behavior      (Consider location/radiation/quality/duration/timing/severity/associated sxs/prior Treatment) HPI  Level V Caveat- psychosis  Patient here with IVC papers from jail, she was there for criminal activity. She reportedly was urinating and defecating on her self and smearing feces on herself and the wall. She is screaming, cursing. She is hand cuffed to the bed but it unnaturally turning over in the stretcher. Flight of ideas. She is hypersexual, she keeps speaking very vulgar and sexual to staff.   Past Medical History  Diagnosis Date  . Sinus tachycardia   . HTN (hypertension)   . Arm pain   . Eye globe prosthesis   . CHF (congestive heart failure)   . Bipolar affective disorder, currently manic, mild   . Hyperthyroidism    Past Surgical History  Procedure Laterality Date  . Eye surgery    . Dilation and curettage of uterus     Family History  Problem Relation Age of Onset  . Hypertension Other   . Emphysema Other   . Asthma Son   . Diabetes Maternal Uncle   . Diabetes Paternal Grandmother    History  Substance Use Topics  . Smoking status: Current Every Day Smoker -- 1.00 packs/day    Types: Cigarettes    Last Attempt to Quit: 08/21/2013  . Smokeless tobacco: Never Used  . Alcohol Use: No   OB History    Gravida Para Term Preterm AB TAB SAB Ectopic Multiple Living   0 1   2     Review of Systems  Level V Caveat- psychosis  Allergies  Amoxil; Trazodone and nefazodone; Ketorolac tromethamine; Prenatal; and Tegretol  Home Medications   Prior to Admission medications   Medication Sig Start Date End Date Taking? Authorizing Provider  ALPRAZolam Prudy Feeler) 1 MG tablet Take 1 tablet (1 mg total) by mouth at bedtime as needed for anxiety. 01/15/14   Vida Roller, MD   furosemide (LASIX) 20 MG tablet Take 1 tablet (20 mg total) by mouth daily. Patient not taking: Reported on 06/01/2014 01/15/14   Vida Roller, MD  haloperidol (HALDOL) 5 MG tablet Take 5 mg by mouth daily.     Historical Provider, MD  ibuprofen (ADVIL,MOTRIN) 600 MG tablet Take 1 tablet (600 mg total) by mouth 4 (four) times daily. Patient not taking: Reported on 06/01/2014 09/04/13   Jamal Collin, MD  lisinopril (PRINIVIL,ZESTRIL) 20 MG tablet Take 20 mg by mouth daily.    Historical Provider, MD  medroxyPROGESTERone (PROVERA) 10 MG tablet Take 1 tablet (10 mg total) by mouth daily. Patient not taking: Reported on 06/01/2014 11/27/13   Marny Lowenstein, PA-C  oxyCODONE-acetaminophen (PERCOCET) 5-325 MG per tablet Take 1 tablet by mouth every 4 (four) hours as needed. Patient not taking: Reported on 06/01/2014 01/15/14   Vida Roller, MD  propylthiouracil (PTU) 50 MG tablet Take 50 mg by mouth 3 (three) times daily.    Historical Provider, MD  traMADol (ULTRAM) 50 MG tablet Take 100 mg by mouth every 6 (six) hours as needed for moderate pain.    Historical Provider, MD  valACYclovir (VALTREX) 500 MG tablet Take 500 mg by mouth daily.    Historical Provider, MD   BP 119/76 mmHg  Pulse 93  Temp(Src) 98.3 F (36.8 C) (Oral)  Resp 26  SpO2 100% Physical Exam  Constitutional: She appears well-developed and well-nourished.  HENT:  Head: Normocephalic and atraumatic.  Cardiovascular: Normal rate.   Pulmonary/Chest: Effort normal.  Musculoskeletal: Normal range of motion.  Neurological: She is alert.  Psychiatric: Her affect is angry. Her speech is rapid and/or pressured. She is agitated, aggressive and combative. She expresses impulsivity.  Acutely psychotic    ED Course  Procedures (including critical care time) Labs Review Labs Reviewed  CBC WITH DIFFERENTIAL  URINE RAPID DRUG SCREEN (HOSP PERFORMED)  URINALYSIS, ROUTINE W REFLEX MICROSCOPIC  PREGNANCY, URINE  BASIC METABOLIC PANEL   ACETAMINOPHEN LEVEL  SALICYLATE LEVEL  ETHANOL    Imaging Review No results found.   EKG Interpretation None      MDM   Final diagnoses:  Psychosis    Dr. Gwendolyn Grant saw patient with me as well. Given 10 mg IM of Geodon. That has helped to sedate patient. Will give acute abdominal xray to evaluate for possible FB due to patient claiming she stuck something in her rectum.  Labs pending. Portable abdomen does not show any foreign body retained in rectum or vagina.  2: 30 pm When patient sobers, will need order for TTS consult to be placed in system. RN, [place] aware that TTS will still need to be consulted. At this point she is sleeping, vital signs are stable.labs are unremarkable. UDS and UA pending. Holding orders placed, home meds reviewed.  Filed Vitals:   07/04/14 1330  BP: 117/82  Pulse: 85  Temp:   Resp:      Dorthula Matas, PA-C 07/04/14 1437  Elwin Mocha, MD 07/05/14 [phone]

## 2014-07-04 NOTE — ED Notes (Signed)
Patient transferred back from Triage.  She was refusing to walk on her own and needed assistance to her room.  She immediately laid down on the stretcher.  I introduced myself and covered her up.  The side rails were placed in an up position.

## 2014-07-04 NOTE — BH Assessment (Signed)
Christine Cobb, [facility], confirmed adult unit is currently at capacity. Contacted the following facilities for placement:  BED AVAILABLE. FAXED CLINICAL INFORMATION: [facility], per [facility], per [facility]  AT CAPACITY: [facility], per [facility], per [facility], per [facility], per [facility], per [facility], per [facility], per [facility], per [facility], per [facility], per [facility], per [facility], per [facility], per [facility], per [facility], per Britta Mccreedy  NO RESPONSE: [facility]   [address] [place], [place], [facility] Triage Specialist [phone]

## 2014-07-04 NOTE — ED Notes (Signed)
Bed: GE36 Expected date:  Expected time:  Means of arrival:  Comments: [facility] department- combative

## 2014-07-04 NOTE — ED Notes (Signed)
Pt agitated, throwing her dinner tray, cursing at staff, coming towards staff members. [employer] and Security at bedside for assistance.  Med orders given by Dr Criss Alvine.

## 2014-07-04 NOTE — ED Notes (Signed)
Patient came out of her room and urinated on the floor.  She then stripped her clothes off and walked into the bathroom to shower.  She was provided with clean scrubs and supplies for her menstrual cycle.  She became very loud after the shower and was hitting at the glass in front of the nurses station.  She was also walking up to female clients in a seductive manner.  She verbally escalated and became threatening.  Call placed to EDP, orders given for Geodon IM.

## 2014-07-04 NOTE — ED Notes (Signed)
Patient is yelling, screaming taking her food tray and cups and wrapping them in her blanket and then stomping on them. She has destroyed her room with trash and being verbal abusive to staff including [employer] and security

## 2014-07-04 NOTE — BH Assessment (Signed)
[facility] Assessment Progress Note   Tele assessment cancelled by Marlon Pel, PA-C when this writer called @ [phone], as pt just given Geodon.  PA to call back when pt wakes up and is able to be assessed via tele assessment.  Casimer Lanius, MS, [facility] Licensed Professional Counselor Therapeutic Triage Specialist [facility] Phone: [phone] Fax: [phone]

## 2014-07-05 DIAGNOSIS — F3110 Bipolar disorder, current episode manic without psychotic features, unspecified: Secondary | ICD-10-CM

## 2014-07-05 DIAGNOSIS — F1910 Other psychoactive substance abuse, uncomplicated: Secondary | ICD-10-CM

## 2014-07-05 LAB — URINALYSIS, ROUTINE W REFLEX MICROSCOPIC
Bilirubin Urine: NEGATIVE
Glucose, UA: NEGATIVE mg/dL
Ketones, ur: NEGATIVE mg/dL
LEUKOCYTES UA: NEGATIVE
Nitrite: NEGATIVE
Protein, ur: NEGATIVE mg/dL
SPECIFIC GRAVITY, URINE: 1.008 (ref 1.005–1.030)
Urobilinogen, UA: 0.2 mg/dL (ref 0.0–1.0)
pH: 6 (ref 5.0–8.0)

## 2014-07-05 LAB — RAPID URINE DRUG SCREEN, HOSP PERFORMED
Amphetamines: NOT DETECTED
BENZODIAZEPINES: POSITIVE — AB
Barbiturates: NOT DETECTED
Cocaine: NOT DETECTED
OPIATES: NOT DETECTED
TETRAHYDROCANNABINOL: NOT DETECTED

## 2014-07-05 LAB — PREGNANCY, URINE: Preg Test, Ur: NEGATIVE

## 2014-07-05 LAB — URINE MICROSCOPIC-ADD ON

## 2014-07-05 MED ORDER — ZIPRASIDONE MESYLATE 20 MG IM SOLR
20.0000 mg | INTRAMUSCULAR | Status: AC
  Administered 2014-07-05: 20 mg via INTRAMUSCULAR
  Filled 2014-07-05: qty 20

## 2014-07-05 MED ORDER — DIPHENHYDRAMINE HCL 50 MG/ML IJ SOLN
50.0000 mg | Freq: Once | INTRAMUSCULAR | Status: AC
  Administered 2014-07-05: 50 mg via INTRAMUSCULAR
  Filled 2014-07-05: qty 1

## 2014-07-05 MED ORDER — OLANZAPINE 10 MG IM SOLR: 10.0000 mg | Freq: Once | INTRAMUSCULAR | Status: AC | PRN

## 2014-07-05 MED ORDER — ZIPRASIDONE MESYLATE 20 MG IM SOLR: 20.0000 mg | Freq: Once | INTRAMUSCULAR | Status: AC

## 2014-07-05 MED ORDER — NICOTINE POLACRILEX 2 MG MT GUM
2.0000 mg | CHEWING_GUM | OROMUCOSAL | Status: DC | PRN
  Administered 2014-07-05 (×3): 2 mg via ORAL
  Filled 2014-07-05 (×4): qty 1

## 2014-07-05 MED ORDER — DIPHENHYDRAMINE HCL 50 MG/ML IJ SOLN
50.0000 mg | Freq: Once | INTRAMUSCULAR | Status: AC
  Administered 2014-07-05: 50 mg via INTRAMUSCULAR

## 2014-07-05 MED ORDER — LORAZEPAM 2 MG/ML IJ SOLN
2.0000 mg | Freq: Once | INTRAMUSCULAR | Status: DC
  Filled 2014-07-05: qty 1

## 2014-07-05 MED ORDER — ZIPRASIDONE MESYLATE 20 MG IM SOLR: 20.0000 mg | Freq: Once | INTRAMUSCULAR | Status: DC

## 2014-07-05 MED ORDER — DIPHENHYDRAMINE HCL 50 MG/ML IJ SOLN: 50.0000 mg | Freq: Once | INTRAMUSCULAR | Status: AC | PRN

## 2014-07-05 MED ORDER — HALOPERIDOL 5 MG PO TABS
5.0000 mg | ORAL_TABLET | Freq: Two times a day (BID) | ORAL | Status: DC
Start: 2014-07-05 — End: 2014-07-06
  Administered 2014-07-05: 5 mg via ORAL
  Filled 2014-07-05 (×3): qty 1

## 2014-07-05 MED ORDER — DIPHENHYDRAMINE HCL 50 MG/ML IJ SOLN: 50.0000 mg | Freq: Once | INTRAMUSCULAR | Status: DC

## 2014-07-05 MED ORDER — DIPHENHYDRAMINE HCL 50 MG/ML IJ SOLN
INTRAMUSCULAR | Status: AC
  Filled 2014-07-05: qty 1

## 2014-07-05 MED ORDER — ZIPRASIDONE MESYLATE 20 MG IM SOLR
INTRAMUSCULAR | Status: AC
  Administered 2014-07-05: 20 mg
  Filled 2014-07-05: qty 20

## 2014-07-05 MED ORDER — WHITE PETROLATUM GEL
Status: DC | PRN
  Filled 2014-07-05 (×2): qty 28.35

## 2014-07-05 MED ORDER — BENZTROPINE MESYLATE 1 MG PO TABS
1.0000 mg | ORAL_TABLET | Freq: Two times a day (BID) | ORAL | Status: DC
Start: 2014-07-05 — End: 2014-07-06
  Administered 2014-07-05: 1 mg via ORAL
  Filled 2014-07-05 (×2): qty 1

## 2014-07-05 MED ORDER — LORAZEPAM 2 MG/ML IJ SOLN
2.0000 mg | Freq: Once | INTRAMUSCULAR | Status: AC
  Administered 2014-07-05: 2 mg via INTRAMUSCULAR

## 2014-07-05 MED ORDER — HYDROXYZINE HCL 25 MG PO TABS
50.0000 mg | ORAL_TABLET | Freq: Every day | ORAL | Status: DC
  Filled 2014-07-05: qty 2

## 2014-07-05 MED ORDER — LORAZEPAM 2 MG/ML IJ SOLN: 2.0000 mg | Freq: Once | INTRAMUSCULAR | Status: AC | PRN

## 2014-07-05 MED ORDER — DIVALPROEX SODIUM 500 MG PO DR TAB
500.0000 mg | DELAYED_RELEASE_TABLET | Freq: Two times a day (BID) | ORAL | Status: DC
  Filled 2014-07-05: qty 1

## 2014-07-05 MED ORDER — LORAZEPAM 2 MG/ML IJ SOLN
2.0000 mg | Freq: Once | INTRAMUSCULAR | Status: AC
  Administered 2014-07-05: 2 mg via INTRAMUSCULAR
  Filled 2014-07-05: qty 1

## 2014-07-05 NOTE — Progress Notes (Signed)
CSW reached out to [facility]. Patient is currently on the wait list.  Trish Mage [phone] ED CSW 07/05/2014 11:22 PM

## 2014-07-05 NOTE — ED Notes (Signed)
Patient up at nurses station cursing. Accusing staff of lying to her. Feels everyone is out to get her. Talking about taking care of babies. Has a lot of flight of ideas. Labile and demanding when awake. Patient will be pleasant then curse someone out a minute later. Cursing other patients and staff. Threatening physical violence. Patient appears to be actively hallucinating at times. Given Geodon, ativan, and Benadryl IM. Patient wanted it lt arm. Angry but took injections without restraint.

## 2014-07-05 NOTE — ED Notes (Addendum)
Pt now sleeping, respirations, even & unlabored, no distress noted,  Will continue to monitor for safety, Q 15 minute checks in place.

## 2014-07-05 NOTE — ED Notes (Signed)
Patient refused Depakote after strongly encouraging a couple of times today. Patient feels that the Geodon, Haldol and ativan are enough. Also feels that the Depakote will make her hair fall out. Patient still labile and curses at times but calmer than this morning. Patient put full maxipad in the toilet and put food in the sink in bathroom and then told staff that it is evidence.Patient reports she is pregnant with 4 or sometimes 3 boys and that she is the virgin [name]. Eating in room at present.

## 2014-07-05 NOTE — ED Notes (Signed)
Pt now stating she is ready to leave, requested that we call the police.  [employer] at bedside to speak with pt.  RN explained that the Psychiatrist is trying to find an inpatient bed for her.  Pt refusing all PO meds.

## 2014-07-05 NOTE — Progress Notes (Signed)
Pt referred to [facility].  Pt [place] authorization number [id number]  Pt demographics completed with Brett Canales at [facility].  Per Junious Dresser, patient on priority waitlist.   Byrd Hesselbach [phone]  ED CSW 07/05/2014 1504pm

## 2014-07-05 NOTE — ED Notes (Signed)
Pt continually getting out of restraints, attempted x 4 to restrain pt, wrists very tiny, pt biting out of restraints, pt now contracting to eat a sandwich, drink juice and get some rest.

## 2014-07-05 NOTE — Consult Note (Signed)
[facility] Face-to-Face Psychiatry Consult   Reason for Consult:  Agitation, mood lability, threatening to harm people Referring Physician: EDP LYNNITA SOMMA is an 39 y.o. female. Total Time spent with patient: 45 minutes  Assessment: AXIS I:  Bipolar 1 disorder recent episode  Manic              Polysubstance abuse AXIS II:  Cluster B Traits AXIS III:   Past Medical History  Diagnosis Date  . Sinus tachycardia   . HTN (hypertension)   . Arm pain   . Eye globe prosthesis   . CHF (congestive heart failure)   . Bipolar affective disorder, currently manic, mild   . Hyperthyroidism    AXIS IV:  other psychosocial or environmental problems, problems related to legal system/crime and problems related to social environment AXIS V:  11-20 some danger of hurting self or others possible OR occasionally fails to maintain minimal personal hygiene OR gross impairment in communication  Plan:  Recommend psychiatric Inpatient admission when medically cleared.  Subjective:   MAEVA DANT is a 39 y.o. female patient admitted with labile mood.  HPI:  ARION MORGAN is a 39 y.o. Female with history of Bipolar disorder and substance abuse who presents to [facility] via IVC petition, initiated by jail custodian.Patient is difficult to interview due to being aggressive behavior and agitation. Patient is a poor historian, but per collateral information  gathered from medical staff notes and IVC petition. Pt was brought in by the sheriff's deputy after recent release from jail during which time, she refused to wear clothing, she was urinating and defecating on the floor. She was smearing  feces on herself and was "playing with it". Per petition, the county physician attempted to medicate pt but was unable to. While incarcerated, pt was incoherent, babbling and screaming. Upon arrival to [facility], pt was screaming and cursing at staff, pt was handcuffed to bed and trying to roll, she has flight of ideas and  hypersexual, using vulgar language towards staff. Pt was transferred to psych unit, she was refusing to walk on her own; she was yelling, screaming, she destroyed the hospital room with trash, taking her food tray and cups, wrapping them in her blanket and then stomping on them. Pt came of out her room and urinated on the floor, stripped off her clothes and walked into the bathroom shower, emerging and hitting the glass at the nurses station and approaching female staff in a seductive manner.  Today, patient is uncooperative, agitated, verbally and physically aggressive, disruptive, banging at the nurses station, threatening physical violence towards the staffs and actively [place].  HPI Elements:   Location:  psychotic, labile. Quality:  severe. Duration:  unknown. Context:  drug use, non-compliant with treatment.  Past Psychiatric History: Past Medical History  Diagnosis Date  . Sinus tachycardia   . HTN (hypertension)   . Arm pain   . Eye globe prosthesis   . CHF (congestive heart failure)   . Bipolar affective disorder, currently manic, mild   . Hyperthyroidism     reports that she has been smoking Cigarettes.  She has been smoking about 1.00 pack per day. She has never used smokeless tobacco. She reports that she uses illicit drugs (Marijuana). She reports that she does not drink alcohol. Family History  Problem Relation Age of Onset  . Hypertension Other   . Emphysema Other   . Asthma Son   . Diabetes Maternal Uncle   . Diabetes Paternal Grandmother  Family History Substance Abuse: No Family Supports: No Living Arrangements: Other (Comment), Alone (Pt recently released from jail ) Can pt return to current living arrangement?: Yes Abuse/Neglect [facility]) Physical Abuse: Denies Verbal Abuse: Denies Sexual Abuse: Denies Allergies:   Allergies  Allergen Reactions  . Amoxil [Amoxicillin] Anaphylaxis  . Trazodone And Nefazodone Anaphylaxis  . Ketorolac Tromethamine  Hives and Swelling  . Prenatal [B-Plex Plus] Nausea Only  . Tegretol [Carbamazepine] Other (See Comments)    Swelling, skin peeling, skin discoloration    ACT Assessment Complete:  Yes:    Educational Status    Risk to Self: Risk to self with the past 6 months Suicidal Ideation: No Suicidal Intent: No Is patient at risk for suicide?: No Suicidal Plan?: No Access to Means: No What has been your use of drugs/alcohol within the last 12 months?: None  Previous Attempts/Gestures: No How many times?: 0 Other Self Harm Risks: None  Triggers for Past Attempts: None known Intentional Self Injurious Behavior: None Family Suicide History: No Recent stressful life event(s): Legal Issues Persecutory voices/beliefs?: No Depression: No Depression Symptoms:  (None reported ) Substance abuse history and/or treatment for substance abuse?: No Suicide prevention information given to non-admitted patients: Not applicable  Risk to Others: Risk to Others within the past 6 months Homicidal Ideation: No Thoughts of Harm to Others: No Current Homicidal Intent: No Current Homicidal Plan: No Access to Homicidal Means: No Identified Victim: None  History of harm to others?: Yes Assessment of Violence: On admission Violent Behavior Description: Verbal agression towards medical staff  Does patient have access to weapons?: No Criminal Charges Pending?: No Does patient have a court date: No  Abuse: Abuse/Neglect Assessment (Assessment to be complete while patient is alone) Physical Abuse: Denies Verbal Abuse: Denies Sexual Abuse: Denies Exploitation of patient/patient's resources: Denies Self-Neglect: Denies  Prior Inpatient Therapy: Prior Inpatient Therapy Prior Inpatient Therapy: No Prior Therapy Dates: None  Prior Therapy Facilty/Provider(s): None  Reason for Treatment: None   Prior Outpatient Therapy: Prior Outpatient Therapy Prior Outpatient Therapy: No Prior Therapy Dates: None  Prior  Therapy Facilty/Provider(s): None  Reason for Treatment: None   Additional Information: Additional Information 1:1 In Past 12 Months?: No CIRT Risk: No Elopement Risk: No Does patient have medical clearance?: Yes                  Objective: Blood pressure 127/81, pulse 108, temperature 97.8 F (36.6 C), temperature source Oral, resp. rate 18, SpO2 100 %, not currently breastfeeding.There is no weight on file to calculate BMI. Results for orders placed or performed during the hospital encounter of 07/04/14 (from the past 72 hour(s))  CBC with Differential     Status: None   Collection Time: 07/04/14 12:31 PM  Result Value Ref Range   WBC 5.9 4.0 - 10.5 K/uL   RBC 4.14 3.87 - 5.11 MIL/uL   Hemoglobin 12.1 12.0 - 15.0 g/dL   HCT 36.1 36.0 - 46.0 %   MCV 87.2 78.0 - 100.0 fL   MCH 29.2 26.0 - 34.0 pg   MCHC 33.5 30.0 - 36.0 g/dL   RDW 15.0 11.5 - 15.5 %   Platelets 263 150 - 400 K/uL   Neutrophils Relative % 51 43 - 77 %   Neutro Abs 3.0 1.7 - 7.7 K/uL   Lymphocytes Relative 35 12 - 46 %   Lymphs Abs 2.1 0.7 - 4.0 K/uL   Monocytes Relative 11 3 - 12 %   Monocytes Absolute 0.6  0.1 - 1.0 K/uL   Eosinophils Relative 2 0 - 5 %   Eosinophils Absolute 0.1 0.0 - 0.7 K/uL   Basophils Relative 1 0 - 1 %   Basophils Absolute 0.1 0.0 - 0.1 K/uL  Basic metabolic panel     Status: Abnormal   Collection Time: 07/04/14 12:31 PM  Result Value Ref Range   Sodium 138 135 - 145 mmol/L    Comment: Please note change in reference range.   Potassium 3.8 3.5 - 5.1 mmol/L    Comment: Please note change in reference range.   Chloride 107 96 - 112 mEq/L   CO2 22 19 - 32 mmol/L   Glucose, Bld 76 70 - 99 mg/dL   BUN 17 6 - 23 mg/dL   Creatinine, Ser 0.91 0.50 - 1.10 mg/dL   Calcium 9.0 8.4 - 10.5 mg/dL   GFR calc non Af Amer 80 (L) >90 mL/min   GFR calc Af Amer >90 >90 mL/min    Comment: (NOTE) The eGFR has been calculated using the CKD EPI equation. This calculation has not been  validated in all clinical situations. eGFR's persistently <90 mL/min signify possible Chronic Kidney Disease.    Anion gap 9 5 - 15  Acetaminophen level     Status: Abnormal   Collection Time: 07/04/14 12:31 PM  Result Value Ref Range   Acetaminophen (Tylenol), Serum <10.0 (L) 10 - 30 ug/mL    Comment:        THERAPEUTIC CONCENTRATIONS VARY SIGNIFICANTLY. A RANGE OF 10-30 ug/mL MAY BE AN EFFECTIVE CONCENTRATION FOR MANY PATIENTS. HOWEVER, SOME ARE BEST TREATED AT CONCENTRATIONS OUTSIDE THIS RANGE. ACETAMINOPHEN CONCENTRATIONS >150 ug/mL AT 4 HOURS AFTER INGESTION AND >50 ug/mL AT 12 HOURS AFTER INGESTION ARE OFTEN ASSOCIATED WITH TOXIC REACTIONS.   Salicylate level     Status: None   Collection Time: 07/04/14 12:31 PM  Result Value Ref Range   Salicylate Lvl <5.3 2.8 - 20.0 mg/dL  I-Stat Beta hCG blood, ED ([facility], [facility], [facility] only)     Status: None   Collection Time: 07/04/14  1:43 PM  Result Value Ref Range   I-stat hCG, quantitative <5.0 <5 mIU/mL   Comment 3            Comment:   GEST. AGE      CONC.  (mIU/mL)   <=1 WEEK        5 - 50     2 WEEKS       50 - 500     3 WEEKS       100 - 10,000     4 WEEKS     1,000 - 30,000        FEMALE AND NON-PREGNANT FEMALE:     LESS THAN 5 mIU/mL    Labs are reviewed and are pertinent for the above.  Current Facility-Administered Medications  Medication Dose Route Frequency Provider Last Rate Last Dose  . benztropine (COGENTIN) tablet 1 mg  1 mg Oral BID Ola Fawver      . divalproex (DEPAKOTE) DR tablet 500 mg  500 mg Oral BID PC Immanuel Fedak      . haloperidol (HALDOL) tablet 5 mg  5 mg Oral BID [place]      . hydrOXYzine (ATARAX/VISTARIL) tablet 50 mg  50 mg Oral QHS Imari Sivertsen      . lisinopril (PRINIVIL,ZESTRIL) tablet 20 mg  20 mg Oral Daily Linus Mako, PA-C   20 mg at 07/05/14 0906  .  nicotine polacrilex (NICORETTE) gum 2 mg  2 mg Oral PRN Waylan Boga, NP   2 mg at 07/05/14 0936  . ondansetron  (ZOFRAN) tablet 4 mg  4 mg Oral Q8H PRN Linus Mako, PA-C      . propylthiouracil (PTU) tablet 50 mg  50 mg Oral TID Waylan Boga, NP   50 mg at 07/05/14 0919  . valACYclovir (VALTREX) tablet 500 mg  500 mg Oral Daily Waylan Boga, NP   500 mg at 07/05/14 0906  . white petrolatum (VASELINE) gel   Topical PRN Waylan Boga, NP      . ziprasidone (GEODON) injection 10 mg  10 mg Intramuscular Once Linus Mako, PA-C   Stopped at 07/04/14 1457   Current Outpatient Prescriptions  Medication Sig Dispense Refill  . ALPRAZolam (XANAX) 1 MG tablet Take 1 tablet (1 mg total) by mouth at bedtime as needed for anxiety. (Patient not taking: Reported on 07/05/2014) 10 tablet 0  . furosemide (LASIX) 20 MG tablet Take 1 tablet (20 mg total) by mouth daily. (Patient not taking: Reported on 06/01/2014) 10 tablet 0  . ibuprofen (ADVIL,MOTRIN) 600 MG tablet Take 1 tablet (600 mg total) by mouth 4 (four) times daily. (Patient not taking: Reported on 06/01/2014) 30 tablet 0  . medroxyPROGESTERone (PROVERA) 10 MG tablet Take 1 tablet (10 mg total) by mouth daily. (Patient not taking: Reported on 06/01/2014) 30 tablet 0  . oxyCODONE-acetaminophen (PERCOCET) 5-325 MG per tablet Take 1 tablet by mouth every 4 (four) hours as needed. (Patient not taking: Reported on 06/01/2014) 10 tablet 0    Psychiatric Specialty Exam:     Blood pressure 127/81, pulse 108, temperature 97.8 F (36.6 C), temperature source Oral, resp. rate 18, SpO2 100 %, not currently breastfeeding.There is no weight on file to calculate BMI.  General Appearance: Bizarre and Disheveled  Eye Contact::  Poor  Speech:  Pressured  Volume:  Increased  Mood:  Angry and Irritable  Affect:  Labile  Thought Process:  Circumstantial and Disorganized  Orientation:  Full (Time, Place, and Person)  Thought Content:  Delusions and Hallucinations: Auditory  Suicidal Thoughts:  No  Homicidal Thoughts:  Yes.  without intent/plan  Memory:  Immediate;    Fair Recent;   Fair Remote;   Poor  Judgement:  Impaired  Insight:  Lacking  Psychomotor Activity:  Increased  Concentration:  Poor  Recall:  Poor  Fund of Knowledge:Fair  Language: Fair  Akathisia:  No  Handed:  Right  AIMS (if indicated):     Assets:  Others:  none  Sleep:   poor   Musculoskeletal: Strength & Muscle Tone: within normal limits Gait & Station: normal Patient leans: N/A  Treatment Plan Summary: Daily contact with patient to assess and evaluate symptoms and progress in treatment Medication management Patient needs inpatient treatment  Corena Pilgrim, MD 07/05/2014 11:33 AM

## 2014-07-05 NOTE — Discharge Instructions (Signed)
Valproic Acid, Divalproex Sodium delayed or extended-release tablets What is this medicine? DIVALPROEX SODIUM (dye VAL pro ex SO dee um) is used to prevent seizures caused by some forms of epilepsy. It is also used to treat bipolar mania and to prevent migraine headaches. This medicine may be used for other purposes; ask your health care provider or pharmacist if you have questions. COMMON BRAND NAME(S): Depakote, Depakote ER What should I tell my health care provider before I take this medicine? They need to know if you have any of these conditions: -blood disease -brain damage or disease -kidney disease -liver disease -low blood proteins -mitochondrial disease -suicidal thoughts, plans, or attempt; a previous suicide attempt by you or a family member -urea cycle disorder (UCD) -an unusual or allergic reaction to divalproex sodium, other medicines, foods, dyes, or preservatives -pregnant or trying to get pregnant -breast-feeding How should I use this medicine? Take this medicine by mouth with a drink of water. Follow the directions on the prescription label. Do not crush or chew. If this medicine upsets your stomach, take it with food or milk. Take your medicine at regular intervals. Do not take it more often than directed. Talk to your pediatrician regarding the use of this medicine in children. Special care may be needed. Overdosage: If you think you have taken too much of this medicine contact a poison control center or emergency room at once. NOTE: This medicine is only for you. Do not share this medicine with others. What if I miss a dose? If you miss a dose, take it as soon as you can. If it is almost time for your next dose, take only that dose. Do not take double or extra doses. What may interact with this medicine? -aspirin -barbiturates, like phenobarbital -diazepam -isoniazid -medicines for depression, anxiety, or psychotic disturbances -medicines that treat or prevent  blood clots like warfarin -meropenem -other seizure medicines -rifampin -tolbutamide -zidovudine This list may not describe all possible interactions. Give your health care provider a list of all the medicines, herbs, non-prescription drugs, or dietary supplements you use. Also tell them if you smoke, drink alcohol, or use illegal drugs. Some items may interact with your medicine. What should I watch for while using this medicine? Visit your doctor or health care professional for regular checks on your progress. If you are taking this medicine to treat epilepsy (seizures), do not stop taking it suddenly. This increases the risk of seizures. Wear a medical identification bracelet or chain to say you have epilepsy or seizures, and carry a card that lists all your medicines. You may get drowsy, dizzy, or have blurred vision. Do not drive, use machinery, or do anything that needs mental alertness until you know how this medicine affects you. To reduce dizzy or fainting spells, do not sit or stand up quickly, especially if you are an older patient. Alcohol can increase drowsiness and dizziness. Avoid alcoholic drinks. This medicine can cause blood problems. This can mean slow healing and a risk of infection. Problems can arise if you need dental work, and in the day to day care of your teeth. Try to avoid damage to your teeth and gums when you brush or floss your teeth. This medicine can make you more sensitive to the sun. Keep out of the sun. If you cannot avoid being in the sun, wear protective clothing and use sunscreen. Do not use sun lamps or tanning beds/booths. The use of this medicine may increase the chance of suicidal thoughts   or actions. Pay special attention to how you are responding while on this medicine. Any worsening of mood, or thoughts of suicide or dying should be reported to your health care professional right away. Women who become pregnant while using this medicine may enroll in the  [place] American Antiepileptic Drug Pregnancy Registry by calling 1-888-233-2334. This registry collects information about the safety of antiepileptic drug use during pregnancy. Contact your doctor or healthcare professional if you notice any part of your medicine in your stool. Your healthcare provider may want to check the amount of medicine in your blood if this happens. What side effects may I notice from receiving this medicine? Side effects that you should report to your doctor or health care professional as soon as possible: -allergic reactions like skin rash, itching or hives, swelling of the face, lips, or tongue -changes in the frequency or severity of seizures -double vision or uncontrollable eye movements -nausea and vomiting -redness, blistering, peeling or loosening of the skin, including inside the mouth -stomach pain or cramps -trembling of hands or arms -unusual bleeding or bruising or pinpoint red spots on the skin -unusual swelling of the arms or legs -unusually weak or tired -worsening of mood, thoughts or actions of suicide or dying -yellowing of skin or eyes Side effects that usually do not require medical attention (report to your doctor or health care professional if they continue or are bothersome): -change in menstrual cycle -diarrhea or constipation -headache -loss of bladder control -loss of hair or unusual growth of hair -loss or increase in appetite -weight gain or loss This list may not describe all possible side effects. Call your doctor for medical advice about side effects. You may report side effects to FDA at 1-800-FDA-1088. Where should I keep my medicine? Keep out of reach of children. Store at room temperature between 15 and 30 degrees C (59 and 86 degrees F). Keep container tightly closed. Throw away any unused medicine after the expiration date. NOTE: This sheet is a summary. It may not cover all possible information. If you have questions about this  medicine, talk to your doctor, pharmacist, or health care provider.  2015, Elsevier/Gold Standard. (2012-02-08 11:50:42)  

## 2014-07-05 NOTE — ED Notes (Signed)
Pt awakened from sleep, ambulated to bathroom, cursing at staff and slamming doors.  Pt stripped naked, sitting on bathroom sink, spreading menses on sink and spreading menses on wall.  Pt unable to be redirected back to room, refusing to put clothes and sanitary products on.  As witnessed by staff, Security and [employer] Officers, pt stood in hallway, bent over spreading her buttocks and pushed menses out of her vagina onto the floor.  NP Janann August and Dr Mora Bellman called for med orders.  Pt escorted back to room by staff, Security & [employer], pt spitting.  Restraint orders given.

## 2014-07-05 NOTE — Progress Notes (Signed)
CSW met with patient at bedside per her request. There was no family present. Per notes, patient presents to the ED via IVC petition, which was initiated by jail custodian. Per notes, patient has a history of Bipolar disorder and substance abuse. Patient stated to CSW "Why am I still here?". Patient says that she comes from jail and needs to go back because her finished paintings are there.   Patient stated that the ED psych staff are her children. Patient says that she is currently pregnant with 4 boys. Patient stated that her name is Christine Cobb and that she is the chosen one. Patient was not aggressive during this interaction. However, she does appear to be manic.  CSW consulted with 1st shift CSW who states that patient is currently on the priority list for [place]. Willette Brace [phone] ED CSW 07/05/2014 4:30 PM

## 2014-07-05 NOTE — ED Notes (Signed)
Pt ambulatory about the floor, Awake, alert & responsive, no distress noted at present, will continue to monitor for safety.

## 2014-07-06 DIAGNOSIS — I10 Essential (primary) hypertension: Secondary | ICD-10-CM | POA: Diagnosis not present

## 2014-07-06 DIAGNOSIS — F312 Bipolar disorder, current episode manic severe with psychotic features: Secondary | ICD-10-CM | POA: Diagnosis not present

## 2014-07-06 DIAGNOSIS — F29 Unspecified psychosis not due to a substance or known physiological condition: Secondary | ICD-10-CM | POA: Diagnosis not present

## 2014-07-06 DIAGNOSIS — F3131 Bipolar disorder, current episode depressed, mild: Secondary | ICD-10-CM | POA: Diagnosis not present

## 2014-07-06 DIAGNOSIS — F340 Cyclothymic disorder: Secondary | ICD-10-CM | POA: Diagnosis not present

## 2014-07-06 DIAGNOSIS — R45850 Homicidal ideations: Secondary | ICD-10-CM

## 2014-07-06 DIAGNOSIS — Z653 Problems related to other legal circumstances: Secondary | ICD-10-CM | POA: Diagnosis not present

## 2014-07-06 NOTE — Progress Notes (Addendum)
8:35am. CSW spoke with Junious Dresser at [facility]. Per Junious Dresser, pt has been accepted. Connie requested MAR and vitals. CSW faxed these to [place]. CSW to continue to follow.   8:38am. CSW received call from [place]. Pt's paperwork approved. Pt can be transported. CSW informed RN.  Mariann Laster,     ED CSW  phone: [phone]

## 2014-07-06 NOTE — Progress Notes (Signed)
11:10am. When pt is ready for transport, call report to [facility] at [phone].

## 2014-07-06 NOTE — Consult Note (Signed)
[facility] Face-to-Face Psychiatry Consult   Reason for Consult:  Agitation, mood lability, threatening to harm people Referring Physician: EDP TEMICA RIGHETTI is an 39 y.o. female. Total Time spent with patient: 45 minutes  Assessment: AXIS I:  Bipolar 1 disorder recent episode  Manic              Polysubstance abuse AXIS II:  Cluster B Traits AXIS III:   Past Medical History  Diagnosis Date  . Sinus tachycardia   . HTN (hypertension)   . Arm pain   . Eye globe prosthesis   . CHF (congestive heart failure)   . Bipolar affective disorder, currently manic, mild   . Hyperthyroidism    AXIS IV:  other psychosocial or environmental problems, problems related to legal system/crime and problems related to social environment AXIS V:  11-20 some danger of hurting self or others possible OR occasionally fails to maintain minimal personal hygiene OR gross impairment in communication  Plan:  Accepted at [facility] this am and is waiting for transport.  Subjective:   COLE KLUGH is a 39 y.o. female patient admitted with labile mood.  HPI:  JOELI FENNER is a 39 y.o. Female with history of Bipolar disorder and substance abuse who presents to [facility] via IVC petition, initiated by jail custodian.Patient is difficult to interview due to being aggressive behavior and agitation. Patient is a poor historian, but per collateral information  gathered from medical staff notes and IVC petition. Pt was brought in by the sheriff's deputy after recent release from jail during which time, she refused to wear clothing, she was urinating and defecating on the floor. She was smearing  feces on herself and was "playing with it". Per petition, the county physician attempted to medicate pt but was unable to. While incarcerated, pt was incoherent, babbling and screaming. Upon arrival to [facility], pt was screaming and cursing at staff, pt was handcuffed to bed and trying to roll, she has flight of ideas and  hypersexual, using vulgar language towards staff. Pt was transferred to psych unit, she was refusing to walk on her own; she was yelling, screaming, she destroyed the hospital room with trash, taking her food tray and cups, wrapping them in her blanket and then stomping on them. Pt came of out her room and urinated on the floor, stripped off her clothes and walked into the bathroom shower, emerging and hitting the glass at the nurses station and approaching female staff in a seductive manner.  Today, patient is uncooperative, agitated, verbally and physically aggressive, disruptive, banging at the nurses station, threatening physical violence towards the staffs and actively [place].  Reviewed above note with update today based on my assessment.  Patient remains disorganized, hallucinating and tangential.  Patient calls her room the house of "God" and people should come in and pray to good.  Patient exhibited flight of ideas jumping from one topic to another.  Patient informs this Probation officer that she is made out of three different countries;[place], [place] and [place].  Patient had on cloth this morning and packed up and made up her bed waiting to go home.  Patient frequently needed redirecting.  She denied SI/HI stating that she is a child of God.  She denied AVH but was responding to internal stimuli.  She has been accepted to [facility] and will be transported as soon as the [place] are here.  HPI Elements:   Location:  psychotic, labile. Quality:  severe. Duration:  unknown. Context:  drug use, non-compliant with treatment.  Past Psychiatric History: Past Medical History  Diagnosis Date  . Sinus tachycardia   . HTN (hypertension)   . Arm pain   . Eye globe prosthesis   . CHF (congestive heart failure)   . Bipolar affective disorder, currently manic, mild   . Hyperthyroidism     reports that she has been smoking Cigarettes.  She has been smoking about 1.00 pack per day. She has never used  smokeless tobacco. She reports that she uses illicit drugs (Marijuana). She reports that she does not drink alcohol. Family History  Problem Relation Age of Onset  . Hypertension Other   . Emphysema Other   . Asthma Son   . Diabetes Maternal Uncle   . Diabetes Paternal Grandmother    Family History Substance Abuse: No Family Supports: No Living Arrangements: Other (Comment), Alone (Pt recently released from jail ) Can pt return to current living arrangement?: Yes Abuse/Neglect [facility]) Physical Abuse: Denies Verbal Abuse: Denies Sexual Abuse: Denies Allergies:   Allergies  Allergen Reactions  . Amoxil [Amoxicillin] Anaphylaxis  . Trazodone And Nefazodone Anaphylaxis  . Ketorolac Tromethamine Hives and Swelling  . Prenatal [B-Plex Plus] Nausea Only  . Tegretol [Carbamazepine] Other (See Comments)    Swelling, skin peeling, skin discoloration    ACT Assessment Complete:  Yes:    Educational Status    Risk to Self: Risk to self with the past 6 months Suicidal Ideation: No Suicidal Intent: No Is patient at risk for suicide?: No Suicidal Plan?: No Access to Means: No What has been your use of drugs/alcohol within the last 12 months?: None  Previous Attempts/Gestures: No How many times?: 0 Other Self Harm Risks: None  Triggers for Past Attempts: None known Intentional Self Injurious Behavior: None Family Suicide History: No Recent stressful life event(s): Legal Issues Persecutory voices/beliefs?: No Depression: No Depression Symptoms:  (None reported ) Substance abuse history and/or treatment for substance abuse?: Yes Suicide prevention information given to non-admitted patients: Not applicable  Risk to Others: Risk to Others within the past 6 months Homicidal Ideation: No Thoughts of Harm to Others: No Current Homicidal Intent: No Current Homicidal Plan: No Access to Homicidal Means: No Identified Victim: None  History of harm to others?: Yes Assessment of Violence:  On admission Violent Behavior Description: Verbal agression towards medical staff  Does patient have access to weapons?: No Criminal Charges Pending?: No Does patient have a court date: No  Abuse: Abuse/Neglect Assessment (Assessment to be complete while patient is alone) Physical Abuse: Denies Verbal Abuse: Denies Sexual Abuse: Denies Exploitation of patient/patient's resources: Denies Self-Neglect: Denies  Prior Inpatient Therapy: Prior Inpatient Therapy Prior Inpatient Therapy: No Prior Therapy Dates: None  Prior Therapy Facilty/Provider(s): None  Reason for Treatment: None   Prior Outpatient Therapy: Prior Outpatient Therapy Prior Outpatient Therapy: No Prior Therapy Dates: None  Prior Therapy Facilty/Provider(s): None  Reason for Treatment: None   Additional Information: Additional Information 1:1 In Past 12 Months?: No CIRT Risk: No Elopement Risk: No Does patient have medical clearance?: Yes   Objective: Blood pressure 131/91, pulse 110, temperature 97.8 F (36.6 C), temperature source Oral, resp. rate 18, SpO2 100 %, not currently breastfeeding.There is no weight on file to calculate BMI. Results for orders placed or performed during the hospital encounter of 07/04/14 (from the past 72 hour(s))  CBC with Differential     Status: None   Collection Time: 07/04/14 12:31 PM  Result Value Ref Range   WBC  5.9 4.0 - 10.5 K/uL   RBC 4.14 3.87 - 5.11 MIL/uL   Hemoglobin 12.1 12.0 - 15.0 g/dL   HCT 36.1 36.0 - 46.0 %   MCV 87.2 78.0 - 100.0 fL   MCH 29.2 26.0 - 34.0 pg   MCHC 33.5 30.0 - 36.0 g/dL   RDW 15.0 11.5 - 15.5 %   Platelets 263 150 - 400 K/uL   Neutrophils Relative % 51 43 - 77 %   Neutro Abs 3.0 1.7 - 7.7 K/uL   Lymphocytes Relative 35 12 - 46 %   Lymphs Abs 2.1 0.7 - 4.0 K/uL   Monocytes Relative 11 3 - 12 %   Monocytes Absolute 0.6 0.1 - 1.0 K/uL   Eosinophils Relative 2 0 - 5 %   Eosinophils Absolute 0.1 0.0 - 0.7 K/uL   Basophils Relative 1 0 - 1 %    Basophils Absolute 0.1 0.0 - 0.1 K/uL  Basic metabolic panel     Status: Abnormal   Collection Time: 07/04/14 12:31 PM  Result Value Ref Range   Sodium 138 135 - 145 mmol/L    Comment: Please note change in reference range.   Potassium 3.8 3.5 - 5.1 mmol/L    Comment: Please note change in reference range.   Chloride 107 96 - 112 mEq/L   CO2 22 19 - 32 mmol/L   Glucose, Bld 76 70 - 99 mg/dL   BUN 17 6 - 23 mg/dL   Creatinine, Ser 0.91 0.50 - 1.10 mg/dL   Calcium 9.0 8.4 - 10.5 mg/dL   GFR calc non Af Amer 80 (L) >90 mL/min   GFR calc Af Amer >90 >90 mL/min    Comment: (NOTE) The eGFR has been calculated using the CKD EPI equation. This calculation has not been validated in all clinical situations. eGFR's persistently <90 mL/min signify possible Chronic Kidney Disease.    Anion gap 9 5 - 15  Acetaminophen level     Status: Abnormal   Collection Time: 07/04/14 12:31 PM  Result Value Ref Range   Acetaminophen (Tylenol), Serum <10.0 (L) 10 - 30 ug/mL    Comment:        THERAPEUTIC CONCENTRATIONS VARY SIGNIFICANTLY. A RANGE OF 10-30 ug/mL MAY BE AN EFFECTIVE CONCENTRATION FOR MANY PATIENTS. HOWEVER, SOME ARE BEST TREATED AT CONCENTRATIONS OUTSIDE THIS RANGE. ACETAMINOPHEN CONCENTRATIONS >150 ug/mL AT 4 HOURS AFTER INGESTION AND >50 ug/mL AT 12 HOURS AFTER INGESTION ARE OFTEN ASSOCIATED WITH TOXIC REACTIONS.   Salicylate level     Status: None   Collection Time: 07/04/14 12:31 PM  Result Value Ref Range   Salicylate Lvl <7.5 2.8 - 20.0 mg/dL  I-Stat Beta hCG blood, ED ([facility], [facility], [facility] only)     Status: None   Collection Time: 07/04/14  1:43 PM  Result Value Ref Range   I-stat hCG, quantitative <5.0 <5 mIU/mL   Comment 3            Comment:   GEST. AGE      CONC.  (mIU/mL)   <=1 WEEK        5 - 50     2 WEEKS       50 - 500     3 WEEKS       100 - 10,000     4 WEEKS     1,000 - 30,000        FEMALE AND NON-PREGNANT FEMALE:     LESS THAN 5 mIU/mL   Urine  rapid drug screen  (hosp performed)     Status: Abnormal   Collection Time: 07/05/14  4:13 PM  Result Value Ref Range   Opiates NONE DETECTED NONE DETECTED   Cocaine NONE DETECTED NONE DETECTED   Benzodiazepines POSITIVE (A) NONE DETECTED   Amphetamines NONE DETECTED NONE DETECTED   Tetrahydrocannabinol NONE DETECTED NONE DETECTED   Barbiturates NONE DETECTED NONE DETECTED    Comment:        DRUG SCREEN FOR MEDICAL PURPOSES ONLY.  IF CONFIRMATION IS NEEDED FOR ANY PURPOSE, NOTIFY LAB WITHIN 5 DAYS.        LOWEST DETECTABLE LIMITS FOR URINE DRUG SCREEN Drug Class       Cutoff (ng/mL) Amphetamine      1000 Barbiturate      200 Benzodiazepine   751 Tricyclics       700 Opiates          300 Cocaine          300 THC              50   Urinalysis, Routine w reflex microscopic     Status: Abnormal   Collection Time: 07/05/14  4:13 PM  Result Value Ref Range   Color, Urine YELLOW YELLOW   APPearance CLEAR CLEAR   Specific Gravity, Urine 1.008 1.005 - 1.030   pH 6.0 5.0 - 8.0   Glucose, UA NEGATIVE NEGATIVE mg/dL   Hgb urine dipstick LARGE (A) NEGATIVE   Bilirubin Urine NEGATIVE NEGATIVE   Ketones, ur NEGATIVE NEGATIVE mg/dL   Protein, ur NEGATIVE NEGATIVE mg/dL   Urobilinogen, UA 0.2 0.0 - 1.0 mg/dL   Nitrite NEGATIVE NEGATIVE   Leukocytes, UA NEGATIVE NEGATIVE  Pregnancy, urine     Status: None   Collection Time: 07/05/14  4:13 PM  Result Value Ref Range   Preg Test, Ur NEGATIVE NEGATIVE    Comment:        THE SENSITIVITY OF THIS METHODOLOGY IS >20 mIU/mL.   Urine microscopic-add on     Status: None   Collection Time: 07/05/14  4:13 PM  Result Value Ref Range   Squamous Epithelial / LPF RARE RARE   WBC, UA 0-2 <3 WBC/hpf   Labs are reviewed and are pertinent for the above.  Current Facility-Administered Medications  Medication Dose Route Frequency Provider Last Rate Last Dose  . benztropine (COGENTIN) tablet 1 mg  1 mg Oral BID Mojeed Akintayo   1 mg at 07/05/14 2338  . divalproex  (DEPAKOTE) DR tablet 500 mg  500 mg Oral BID PC Mojeed Akintayo   500 mg at 07/05/14 1759  . haloperidol (HALDOL) tablet 5 mg  5 mg Oral BID Mojeed Akintayo   5 mg at 07/05/14 2337  . hydrOXYzine (ATARAX/VISTARIL) tablet 50 mg  50 mg Oral QHS Mojeed Akintayo   50 mg at 07/05/14 2234  . lisinopril (PRINIVIL,ZESTRIL) tablet 20 mg  20 mg Oral Daily Linus Mako, PA-C   20 mg at 07/06/14 0844  . nicotine polacrilex (NICORETTE) gum 2 mg  2 mg Oral PRN Waylan Boga, NP   2 mg at 07/05/14 1813  . ondansetron (ZOFRAN) tablet 4 mg  4 mg Oral Q8H PRN Linus Mako, PA-C      . propylthiouracil (PTU) tablet 50 mg  50 mg Oral TID Waylan Boga, NP   50 mg at 07/06/14 0845  . valACYclovir (VALTREX) tablet 500 mg  500 mg Oral Daily Waylan Boga, NP   500 mg at  07/05/14 0906  . white petrolatum (VASELINE) gel   Topical PRN Waylan Boga, NP      . ziprasidone (GEODON) injection 10 mg  10 mg Intramuscular Once Linus Mako, PA-C   Stopped at 07/04/14 1457   Current Outpatient Prescriptions  Medication Sig Dispense Refill  . ALPRAZolam (XANAX) 1 MG tablet Take 1 tablet (1 mg total) by mouth at bedtime as needed for anxiety. (Patient not taking: Reported on 07/05/2014) 10 tablet 0  . furosemide (LASIX) 20 MG tablet Take 1 tablet (20 mg total) by mouth daily. (Patient not taking: Reported on 06/01/2014) 10 tablet 0  . ibuprofen (ADVIL,MOTRIN) 600 MG tablet Take 1 tablet (600 mg total) by mouth 4 (four) times daily. (Patient not taking: Reported on 06/01/2014) 30 tablet 0  . medroxyPROGESTERone (PROVERA) 10 MG tablet Take 1 tablet (10 mg total) by mouth daily. (Patient not taking: Reported on 06/01/2014) 30 tablet 0  . oxyCODONE-acetaminophen (PERCOCET) 5-325 MG per tablet Take 1 tablet by mouth every 4 (four) hours as needed. (Patient not taking: Reported on 06/01/2014) 10 tablet 0    Psychiatric Specialty Exam:     Blood pressure 131/91, pulse 110, temperature 97.8 F (36.6 C), temperature source Oral,  resp. rate 18, SpO2 100 %, not currently breastfeeding.There is no weight on file to calculate BMI.  General Appearance: Bizarre and Disheveled  Eye Contact::  Poor  Speech:  Pressured  Volume:  Increased  Mood:  Angry and Irritable  Affect:  Labile  Thought Process:  Circumstantial and Disorganized  Orientation:  Full (Time, Place, and Person)  Thought Content:  Delusions and Hallucinations: Auditory  Suicidal Thoughts:  No  Homicidal Thoughts:  Yes.  without intent/plan  Memory:  Immediate;   Fair Recent;   Fair Remote;   Poor  Judgement:  Impaired  Insight:  Lacking  Psychomotor Activity:  Increased  Concentration:  Poor  Recall:  Poor  Fund of Knowledge:Fair  Language: Fair  Akathisia:  No  Handed:  Right  AIMS (if indicated):     Assets:  Others:  none  Sleep:   poor   Musculoskeletal: Strength & Muscle Tone: within normal limits Gait & Station: normal Patient leans: N/A  Treatment Plan Summary: Patient is accepted at [facility] and is waiting for transportation.  Charmaine Downs, C,PMHNP-BC 07/06/2014 10:00 AM  Patient seen face-to-face for this evaluation along with physician extender and develop treatment plan. Reviewed the information documented and agree with the treatment plan.  Sanja Elizardo,JANARDHAHA R. 07/06/2014 4:24 PM

## 2014-07-06 NOTE — ED Notes (Signed)
Pt had a restful night, slept throughout night without incident.

## 2014-07-06 NOTE — ED Notes (Signed)
Phoned sherrif's office again.

## 2014-07-06 NOTE — ED Notes (Signed)
Pt becomes aggravated easlily. She stated,I got to take care of these four babies in my stomach." Pt is very disorganized and insisiting she must have her wig. She is refusing to take her haldol and congentin the patient did take a shower this am and presenlty is eating breakfast. Pt contracts for safety at this time. Sheriff's office called at 8:45am to transport [facility]. Awaiting a call back.

## 2014-07-15 IMAGING — US US OB LIMITED
1 series · 13 of 13 positions shown · non-contrast
Comparison: none

CLINICAL DATA: Psychiatric evaluation

EXAM:
LIMITED OBSTETRIC ULTRASOUND

[Series 1: us ob limited · 13 of 13 slices shown]
[im 1/13]
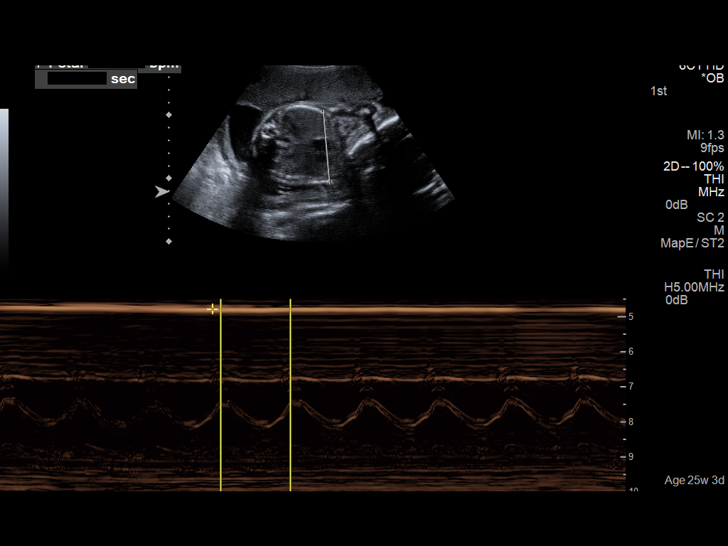
[im 2/13]
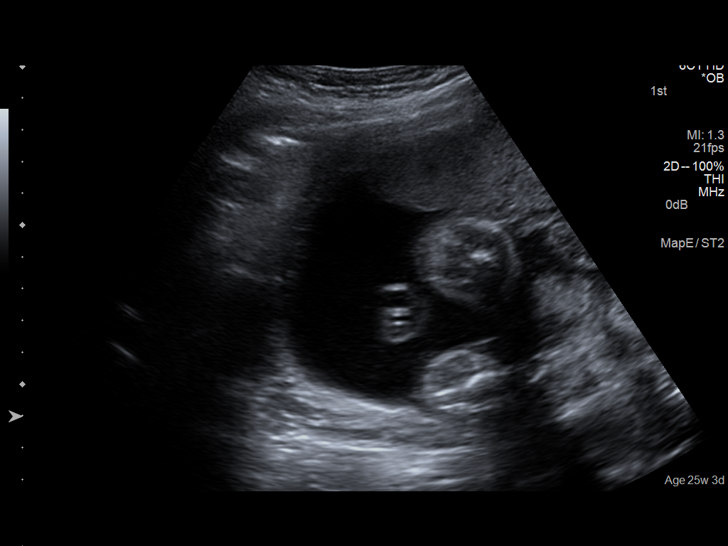
[im 3/13]
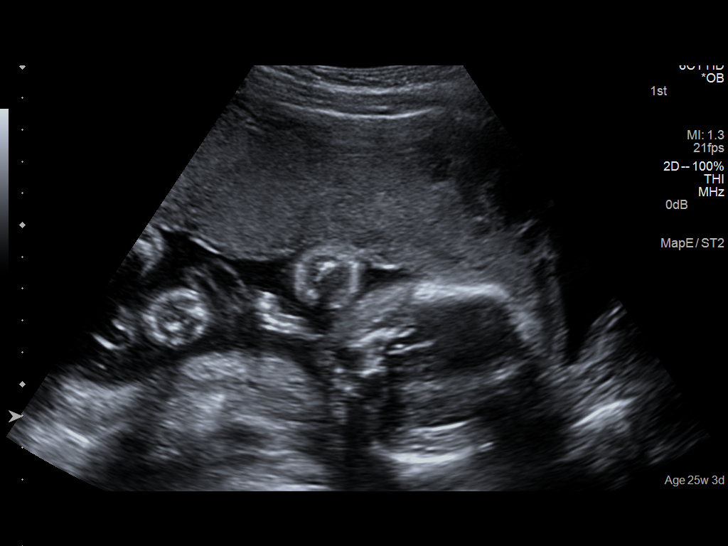
[im 4/13]
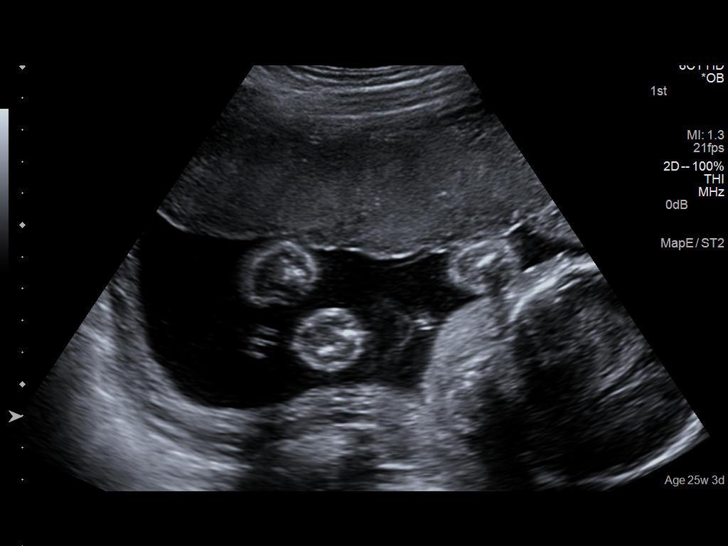
[im 5/13]
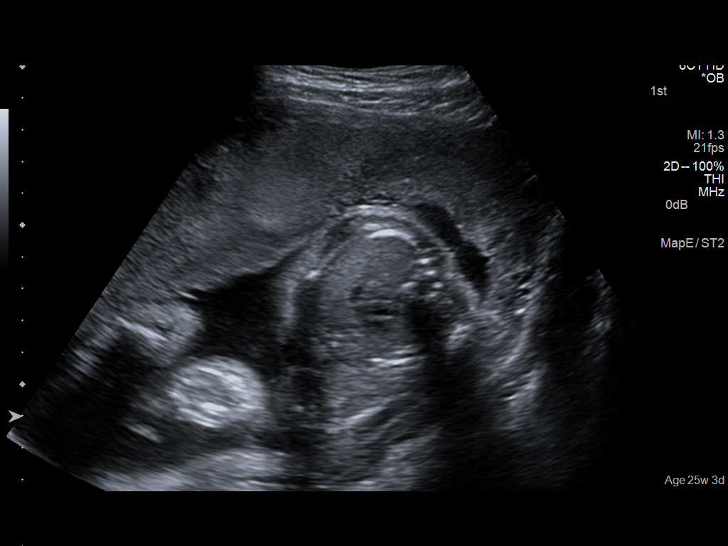
[im 6/13]
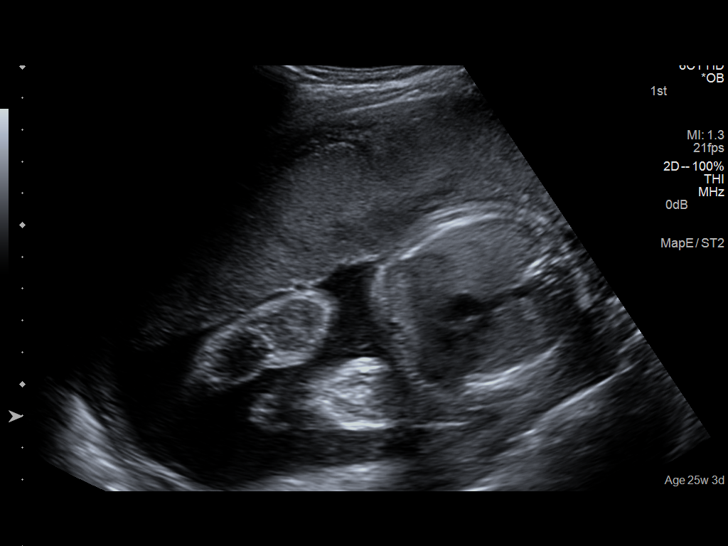
[im 7/13]
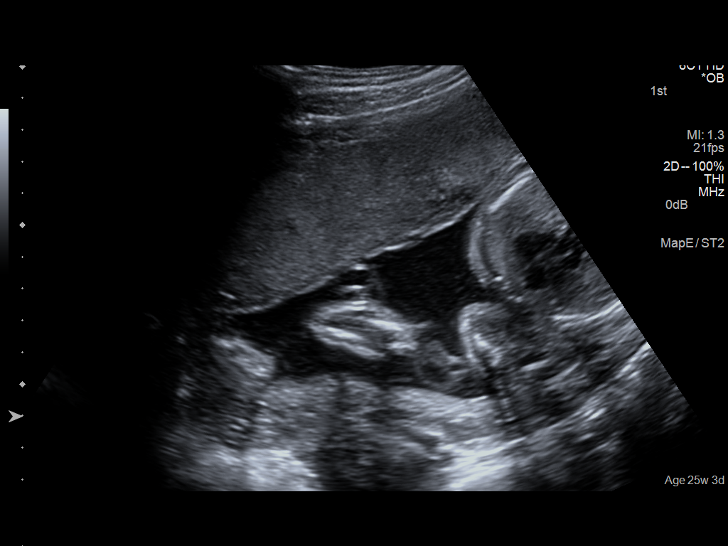
[im 8/13]
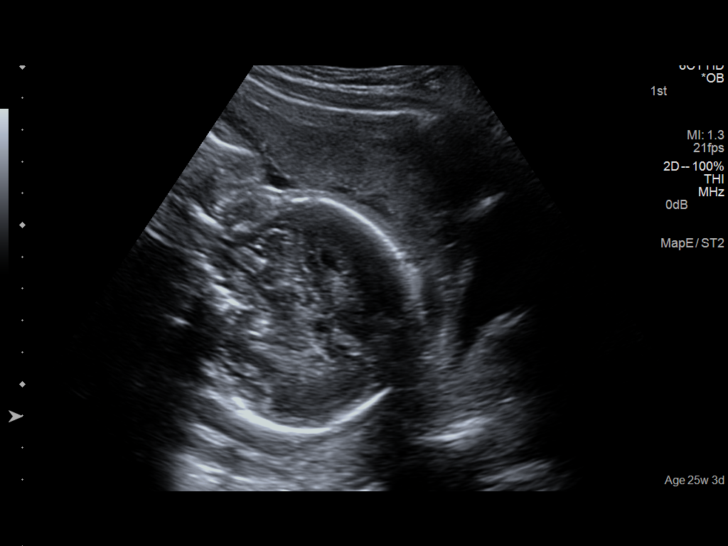
[im 9/13]
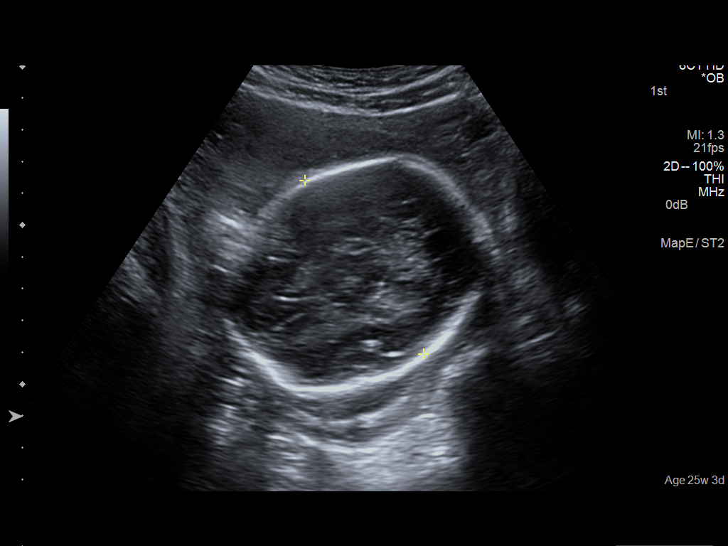
[im 10/13]
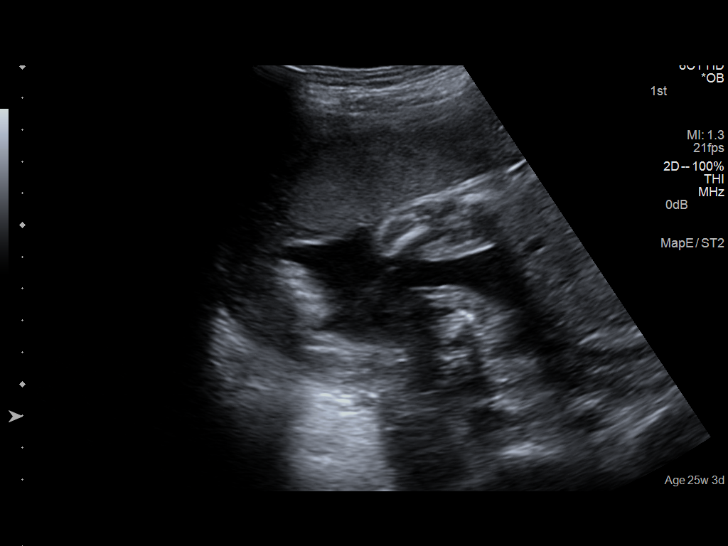
[im 11/13]
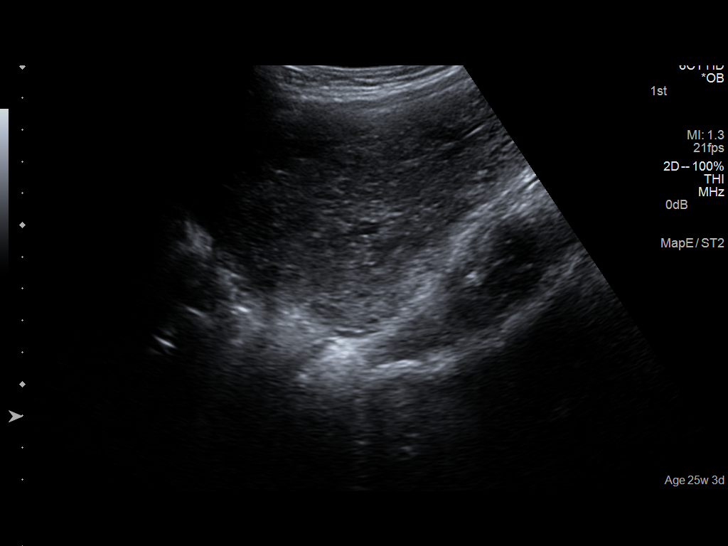
[im 12/13]
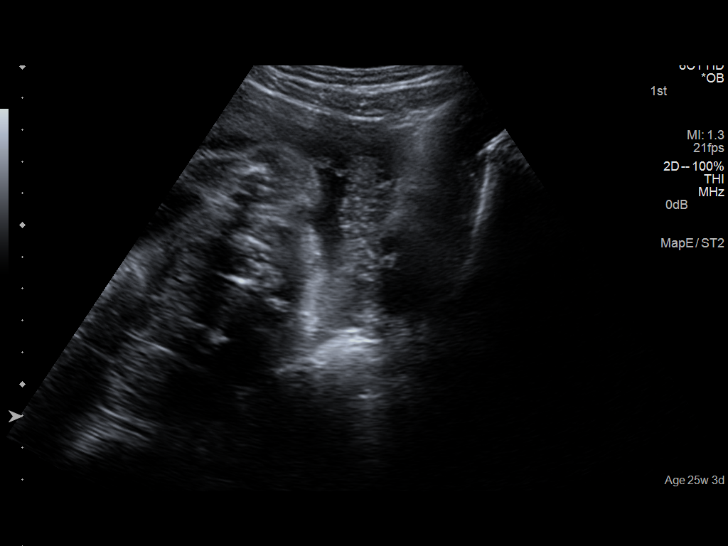
[im 13/13]
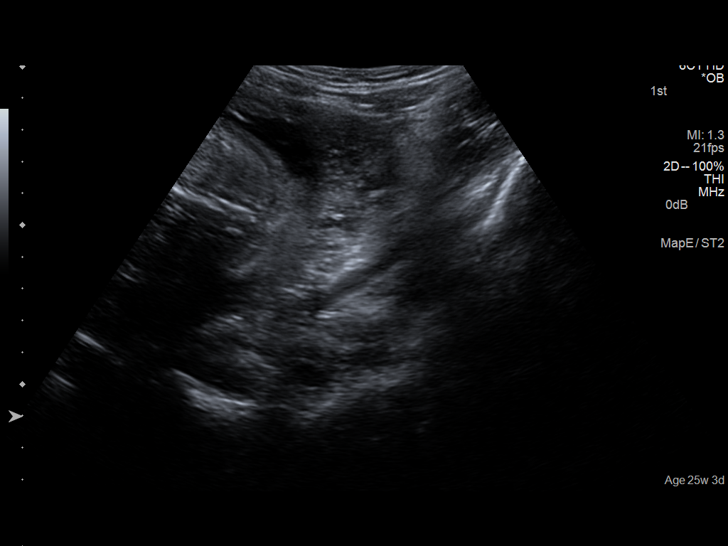

[13 of 13 positions shown; findings below may reference images not displayed]

FINDINGS: Number of Fetuses: 1

Heart Rate:  147 bpm

Movement: Yes

Presentation: cephalic

Placental Location: anterior

Previa: No

Amniotic Fluid (Subjective): Decreased

BPD:  6.6cm 26w  5d

MATERNAL FINDINGS:

Cervix:  Difficult to assess due to low lying head

Uterus/Adnexae:  No abnormality visualized.

Other:  Patient terminated scan.
IMPRESSION: Single living intrauterine gestation. The estimated gestational age
is 26 weeks and 5 days.

This exam is performed on an emergent basis and does not
comprehensively evaluate fetal size, dating, or anatomy; follow-up
complete OB US should be considered if further fetal assessment is
warranted.

## 2014-08-13 DIAGNOSIS — F250 Schizoaffective disorder, bipolar type: Secondary | ICD-10-CM | POA: Diagnosis not present

## 2014-08-13 DIAGNOSIS — F419 Anxiety disorder, unspecified: Secondary | ICD-10-CM | POA: Diagnosis not present

## 2014-10-12 ENCOUNTER — Encounter (HOSPITAL_COMMUNITY): Payer: Self-pay | Admitting: Emergency Medicine

## 2014-10-12 ENCOUNTER — Emergency Department (HOSPITAL_COMMUNITY)
Admission: EM | Admit: 2014-10-12 | Discharge: 2014-10-14 | Disposition: A | Discharge status: Home / Self Care | Payer: Medicare Other | Attending: Emergency Medicine | Admitting: Emergency Medicine

## 2014-10-12 DIAGNOSIS — I10 Essential (primary) hypertension: Secondary | ICD-10-CM | POA: Insufficient documentation

## 2014-10-12 DIAGNOSIS — F312 Bipolar disorder, current episode manic severe with psychotic features: Secondary | ICD-10-CM | POA: Diagnosis not present

## 2014-10-12 DIAGNOSIS — F99 Mental disorder, not otherwise specified: Secondary | ICD-10-CM | POA: Diagnosis not present

## 2014-10-12 DIAGNOSIS — Z79899 Other long term (current) drug therapy: Secondary | ICD-10-CM | POA: Insufficient documentation

## 2014-10-12 DIAGNOSIS — Z8639 Personal history of other endocrine, nutritional and metabolic disease: Secondary | ICD-10-CM | POA: Insufficient documentation

## 2014-10-12 DIAGNOSIS — F29 Unspecified psychosis not due to a substance or known physiological condition: Secondary | ICD-10-CM | POA: Diagnosis not present

## 2014-10-12 DIAGNOSIS — Z720 Tobacco use: Secondary | ICD-10-CM | POA: Diagnosis not present

## 2014-10-12 DIAGNOSIS — R451 Restlessness and agitation: Secondary | ICD-10-CM

## 2014-10-12 DIAGNOSIS — F3010 Manic episode without psychotic symptoms, unspecified: Secondary | ICD-10-CM

## 2014-10-12 DIAGNOSIS — F309 Manic episode, unspecified: Secondary | ICD-10-CM | POA: Diagnosis not present

## 2014-10-12 DIAGNOSIS — Z8669 Personal history of other diseases of the nervous system and sense organs: Secondary | ICD-10-CM | POA: Insufficient documentation

## 2014-10-12 DIAGNOSIS — I509 Heart failure, unspecified: Secondary | ICD-10-CM | POA: Diagnosis not present

## 2014-10-12 DIAGNOSIS — R461 Bizarre personal appearance: Secondary | ICD-10-CM | POA: Diagnosis present

## 2014-10-12 DIAGNOSIS — Z3202 Encounter for pregnancy test, result negative: Secondary | ICD-10-CM | POA: Insufficient documentation

## 2014-10-12 DIAGNOSIS — F3110 Bipolar disorder, current episode manic without psychotic features, unspecified: Secondary | ICD-10-CM | POA: Diagnosis present

## 2014-10-12 LAB — ETHANOL: Alcohol, Ethyl (B): <5 mg/dL (ref 0–9)

## 2014-10-12 LAB — COMPREHENSIVE METABOLIC PANEL
ALK PHOS: 70 U/L (ref 39–117)
ALT: 17 U/L (ref 0–35)
ANION GAP: 10 (ref 5–15)
AST: 21 U/L (ref 0–37)
Albumin: 4.5 g/dL (ref 3.5–5.2)
BUN: 11 mg/dL (ref 6–23)
CALCIUM: 9.4 mg/dL (ref 8.4–10.5)
CO2: 24 mmol/L (ref 19–32)
CREATININE: 1 mg/dL (ref 0.50–1.10)
Chloride: 104 mmol/L (ref 96–112)
GFR calc Af Amer: 83 mL/min — ABNORMAL LOW (ref >90)
GFR calc non Af Amer: 72 mL/min — ABNORMAL LOW (ref >90)
Glucose, Bld: 116 mg/dL — ABNORMAL HIGH (ref 70–99)
Potassium: 4.3 mmol/L (ref 3.5–5.1)
Sodium: 138 mmol/L (ref 135–145)
Total Bilirubin: 1 mg/dL (ref 0.3–1.2)
Total Protein: 8.1 g/dL (ref 6.0–8.3)

## 2014-10-12 LAB — CBC WITH DIFFERENTIAL/PLATELET
BASOS ABS: 0.1 10*3/uL (ref 0.0–0.1)
Basophils Relative: 1 % (ref 0–1)
EOS PCT: 2 % (ref 0–5)
Eosinophils Absolute: 0.2 10*3/uL (ref 0.0–0.7)
HEMATOCRIT: 42.7 % (ref 36.0–46.0)
Hemoglobin: 14.4 g/dL (ref 12.0–15.0)
LYMPHS PCT: 35 % (ref 12–46)
Lymphs Abs: 2.4 10*3/uL (ref 0.7–4.0)
MCH: 29.7 pg (ref 26.0–34.0)
MCHC: 33.7 g/dL (ref 30.0–36.0)
MCV: 88 fL (ref 78.0–100.0)
MONO ABS: 0.6 10*3/uL (ref 0.1–1.0)
MONOS PCT: 9 % (ref 3–12)
Neutro Abs: 3.7 10*3/uL (ref 1.7–7.7)
Neutrophils Relative %: 53 % (ref 43–77)
Platelets: 293 10*3/uL (ref 150–400)
RBC: 4.85 MIL/uL (ref 3.87–5.11)
RDW: 13.1 % (ref 11.5–15.5)
WBC: 6.9 10*3/uL (ref 4.0–10.5)

## 2014-10-12 LAB — ACETAMINOPHEN LEVEL

## 2014-10-12 LAB — SALICYLATE LEVEL: Salicylate Lvl: <4 mg/dL (ref 2.8–20.0)

## 2014-10-12 LAB — HCG, SERUM, QUALITATIVE: Preg, Serum: NEGATIVE

## 2014-10-12 MED ORDER — STERILE WATER FOR INJECTION IJ SOLN
INTRAMUSCULAR | Status: AC
  Administered 2014-10-12: 10 mL
  Filled 2014-10-12: qty 10

## 2014-10-12 MED ORDER — LORAZEPAM 1 MG PO TABS
2.0000 mg | ORAL_TABLET | Freq: Three times a day (TID) | ORAL | Status: DC | PRN
  Administered 2014-10-12 – 2014-10-13 (×2): 2 mg via ORAL
  Filled 2014-10-12 (×2): qty 2

## 2014-10-12 MED ORDER — SODIUM CHLORIDE 0.9 % IV BOLUS (SEPSIS): 1000.0000 mL | INTRAVENOUS | Status: DC

## 2014-10-12 MED ORDER — ALPRAZOLAM 1 MG PO TABS
1.0000 mg | ORAL_TABLET | Freq: Every evening | ORAL | Status: DC | PRN
  Administered 2014-10-12: 1 mg via ORAL
  Filled 2014-10-12: qty 1

## 2014-10-12 MED ORDER — ACETAMINOPHEN 325 MG PO TABS
650.0000 mg | ORAL_TABLET | Freq: Once | ORAL | Status: AC
  Administered 2014-10-12: 650 mg via ORAL
  Filled 2014-10-12: qty 2

## 2014-10-12 MED ORDER — FUROSEMIDE 20 MG PO TABS
20.0000 mg | ORAL_TABLET | Freq: Every day | ORAL | Status: DC
  Administered 2014-10-13: 20 mg via ORAL
  Filled 2014-10-12 (×2): qty 1

## 2014-10-12 MED ORDER — LORAZEPAM 1 MG PO TABS
1.0000 mg | ORAL_TABLET | Freq: Once | ORAL | Status: AC
  Administered 2014-10-12: 1 mg via ORAL
  Filled 2014-10-12: qty 1

## 2014-10-12 MED ORDER — ZIPRASIDONE MESYLATE 20 MG IM SOLR: 10.0000 mg | Freq: Once | INTRAMUSCULAR | Status: DC

## 2014-10-12 MED ORDER — OLANZAPINE 5 MG PO TABS
5.0000 mg | ORAL_TABLET | Freq: Two times a day (BID) | ORAL | Status: DC
  Administered 2014-10-12 – 2014-10-13 (×2): 5 mg via ORAL
  Filled 2014-10-12 (×4): qty 1

## 2014-10-12 MED ORDER — ZIPRASIDONE MESYLATE 20 MG IM SOLR
10.0000 mg | Freq: Once | INTRAMUSCULAR | Status: AC
  Administered 2014-10-12: 10 mg via INTRAMUSCULAR
  Filled 2014-10-12: qty 20
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
## 2014-10-12 NOTE — ED Notes (Signed)
Report called to Janie RN Pt to go to room 39 after Vital Sign recheck if bp stable

## 2014-10-12 NOTE — ED Notes (Signed)
Patient woke up requesting her meal stated "I'M [place]". Her dinner tray was served, sandwich and drink. Patient refused her due medication of zyprexa stated "I don't take that shit; I take Lisinopril. Did the dr want to kill my ass". Patient becoming agitated. Medication wasted as it has been opened. Later patient called this Clinical research associate and accepted to take the Zyprexa.
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
## 2014-10-12 NOTE — ED Notes (Signed)
Pt refuses ekg,lab work and urine. She states she wants [place] to leave her room and no one can enter

## 2014-10-13 DIAGNOSIS — F309 Manic episode, unspecified: Secondary | ICD-10-CM | POA: Diagnosis not present

## 2014-10-13 DIAGNOSIS — F312 Bipolar disorder, current episode manic severe with psychotic features: Secondary | ICD-10-CM | POA: Diagnosis not present

## 2014-10-13 DIAGNOSIS — R451 Restlessness and agitation: Secondary | ICD-10-CM | POA: Insufficient documentation

## 2014-10-13 DIAGNOSIS — F3010 Manic episode without psychotic symptoms, unspecified: Secondary | ICD-10-CM | POA: Insufficient documentation

## 2014-10-13 MED ORDER — LORAZEPAM 1 MG PO TABS
1.0000 mg | ORAL_TABLET | Freq: Three times a day (TID) | ORAL | Status: DC | PRN
  Filled 2014-10-13: qty 1
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
## 2014-10-14 NOTE — [facility] Suicide Risk Assessment (Signed)
Suicide Risk Assessment  Discharge Assessment   [facility] Discharge Suicide Risk Assessment   Demographic Factors:  NA  Total Time spent with patient: 45 minutes   Musculoskeletal: Strength & Muscle Tone: within normal limits Gait & Station: normal Patient leans: N/A  Psychiatric Specialty Exam:     Blood pressure 103/66, pulse 98, temperature 98.4 F (36.9 C), temperature source Oral, resp. rate 15, SpO2 99 %, not currently breastfeeding.There is no weight on file to calculate BMI.  General Appearance: Casual  Eye Contact::  Good  Speech:  Normal Rate  Volume:  Normal  Mood:  Euthymic  Affect:  Congruent  Thought Process:  Coherent  Orientation:  Full (Time, Place, and Person)  Thought Content:  WDL  Suicidal Thoughts:  No  Homicidal Thoughts:  No  Memory:  Immediate;   Good Recent;   Good Remote;   Good  Judgement:  Good  Insight:  Fair  Psychomotor Activity:  Normal  Concentration:  Good  Recall:  Good  Fund of Knowledge:Good  Language: Good  Akathisia:  No  Handed:  Right  AIMS (if indicated):     Assets:  Housing Leisure Time Physical Health Resilience Social Support  ADL's:  Intact  Cognition: WNL  Sleep:         Has this patient used any form of tobacco in the last 30 days? (Cigarettes, Smokeless Tobacco, Cigars, and/or Pipes) Yes, A prescription for an FDA-approved tobacco cessation medication was offered at discharge and the patient refused  Mental Status Per Nursing Assessment::   On Admission:   Aggressive behaviors  Current Mental Status by Physician: NA  Loss Factors: NA  Historical Factors: NA  Risk Reduction Factors:   Sense of responsibility to family, Positive social support and Positive therapeutic relationship  Continued Clinical Symptoms:  None  Cognitive Features That Contribute To Risk:  None    Suicide Risk:  Minimal: No identifiable suicidal ideation.  Patients presenting with no risk factors but with morbid ruminations;  may be classified as minimal risk based on the severity of the depressive symptoms  Principal Problem: Bipolar affective disorder, current episode manic with psychotic symptoms Discharge Diagnoses:  Patient Active Problem List   Diagnosis Date Noted  . Bipolar affective disorder, current episode manic with psychotic symptoms [F31.2] 03/16/2014    Priority: High  . Agitation [R45.1]   . Manic behavior [F30.10]   . Psychosis [F29]   . Hyperthyroidism [E05.90] 09/21/2013  . Marijuana abuse [F12.10] 04/12/2013  . History of CHF (congestive heart failure) [Z86.79] 02/01/2013  . Abscess of abdominal wall [L02.211] 10/18/2012  . HTN (hypertension) [I10] 04/08/2011  . Tachycardia [R00.0] 04/08/2011    Follow-up Information    Follow up with [facility].   Specialty:  [facility] information:   [address] [place] [address] [phone]       Plan Of Care/Follow-up recommendations:  Activity:  as tolerated Diet:  heart healthy diet  Is patient on multiple antipsychotic therapies at discharge:  No   Has Patient had three or more failed trials of antipsychotic monotherapy by history:  No  Recommended Plan for Multiple Antipsychotic Therapies: NA    LORD, JAMISON, PMH-NP 10/14/2014, 2:06 PM
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
## 2014-10-14 NOTE — [facility] Counselor (Signed)
Per Joann Glover, AC at [facility], adult unit is at capacity. Contacted the following facilities for placement:  BED AVAILABLE, FAXED CLINICAL INFORMATION: [facility], per [facility], per Jan [facility], per Shannon [facility], per Stephanie  AT CAPACITY: [facility], per Tina [facility], per Jonathan [facility], per [facility], per [facility], per Janet [facility], per [facility], per Heather [facility], per [facility], per [facility], per Kelly [facility], per Rose [facility], per Sheila [facility], per [facility], per [facility], per Claudine [facility], per Denise [facility], per Rachel   Christine Cobb Caralina Nop Jr, LPC, NCC Triage Specialist [phone] 

## 2014-11-29 ENCOUNTER — Emergency Department (HOSPITAL_COMMUNITY)
Admission: EM | Admit: 2014-11-29 | Discharge: 2014-11-30 | Payer: Medicare Other | Attending: Emergency Medicine | Admitting: Emergency Medicine

## 2014-11-29 DIAGNOSIS — Z79899 Other long term (current) drug therapy: Secondary | ICD-10-CM | POA: Insufficient documentation

## 2014-11-29 DIAGNOSIS — F3113 Bipolar disorder, current episode manic without psychotic features, severe: Secondary | ICD-10-CM | POA: Insufficient documentation

## 2014-11-29 DIAGNOSIS — Z8669 Personal history of other diseases of the nervous system and sense organs: Secondary | ICD-10-CM | POA: Diagnosis not present

## 2014-11-29 DIAGNOSIS — Z8639 Personal history of other endocrine, nutritional and metabolic disease: Secondary | ICD-10-CM | POA: Diagnosis not present

## 2014-11-29 DIAGNOSIS — I509 Heart failure, unspecified: Secondary | ICD-10-CM | POA: Insufficient documentation

## 2014-11-29 DIAGNOSIS — Z720 Tobacco use: Secondary | ICD-10-CM | POA: Diagnosis not present

## 2014-11-29 DIAGNOSIS — I10 Essential (primary) hypertension: Secondary | ICD-10-CM | POA: Diagnosis not present

## 2014-11-29 DIAGNOSIS — Z008 Encounter for other general examination: Secondary | ICD-10-CM | POA: Diagnosis present

## 2014-11-29 DIAGNOSIS — F99 Mental disorder, not otherwise specified: Secondary | ICD-10-CM | POA: Diagnosis not present

## 2014-11-29 DIAGNOSIS — F29 Unspecified psychosis not due to a substance or known physiological condition: Secondary | ICD-10-CM | POA: Diagnosis not present

## 2014-11-29 DIAGNOSIS — F3110 Bipolar disorder, current episode manic without psychotic features, unspecified: Secondary | ICD-10-CM | POA: Diagnosis not present

## 2014-11-29 DIAGNOSIS — F1410 Cocaine abuse, uncomplicated: Secondary | ICD-10-CM | POA: Insufficient documentation

## 2014-11-29 DIAGNOSIS — Z3202 Encounter for pregnancy test, result negative: Secondary | ICD-10-CM | POA: Diagnosis not present

## 2014-11-29 MED ORDER — ZIPRASIDONE MESYLATE 20 MG IM SOLR
10.0000 mg | Freq: Once | INTRAMUSCULAR | Status: AC
  Administered 2014-11-30: 10 mg via INTRAMUSCULAR

## 2014-11-29 MED ORDER — STERILE WATER FOR INJECTION IJ SOLN
INTRAMUSCULAR | Status: AC
  Administered 2014-11-30: 10 mL
  Filled 2014-11-29: qty 10

## 2014-11-29 MED ORDER — ZIPRASIDONE MESYLATE 20 MG IM SOLR
INTRAMUSCULAR | Status: AC
  Administered 2014-11-30: 10 mg via INTRAMUSCULAR
  Filled 2014-11-29: qty 20

## 2014-11-29 NOTE — ED Provider Notes (Signed)
MSE was initiated and I personally evaluated the patient and placed orders (if any) at  11:45 PM on November 29, 2014.  The patient appears stable so that the remainder of the MSE may be completed by another provider.  PT with psychotic behavior.  Hx of bipolar d/o.  Screaming in triage, cursing at staff.  Will give geodon, check labs.  Rolan Bucco, MD 11/29/14 [phone]

## 2014-11-29 NOTE — ED Notes (Signed)
Pt called EMS from home for transport to this facility. Pt requesting Geodon, morphine, [place] scrubs, and to be transferred to [facility] for admission. Pt very aggressive with staff.

## 2014-11-30 ENCOUNTER — Encounter (HOSPITAL_COMMUNITY): Payer: Self-pay | Admitting: Emergency Medicine

## 2014-11-30 DIAGNOSIS — F3110 Bipolar disorder, current episode manic without psychotic features, unspecified: Secondary | ICD-10-CM | POA: Diagnosis not present

## 2014-11-30 DIAGNOSIS — F3113 Bipolar disorder, current episode manic without psychotic features, severe: Secondary | ICD-10-CM | POA: Insufficient documentation

## 2014-11-30 LAB — CBC WITH DIFFERENTIAL/PLATELET
Basophils Absolute: 0.1 10*3/uL (ref 0.0–0.1)
Basophils Relative: 1 % (ref 0–1)
Eosinophils Absolute: 0 10*3/uL (ref 0.0–0.7)
Eosinophils Relative: 0 % (ref 0–5)
HCT: 42.6 % (ref 36.0–46.0)
HEMOGLOBIN: 14.3 g/dL (ref 12.0–15.0)
LYMPHS ABS: 2.1 10*3/uL (ref 0.7–4.0)
LYMPHS PCT: 26 % (ref 12–46)
MCH: 28.7 pg (ref 26.0–34.0)
MCHC: 33.6 g/dL (ref 30.0–36.0)
MCV: 85.4 fL (ref 78.0–100.0)
MONOS PCT: 10 % (ref 3–12)
Monocytes Absolute: 0.8 10*3/uL (ref 0.1–1.0)
Neutro Abs: 5.1 10*3/uL (ref 1.7–7.7)
Neutrophils Relative %: 63 % (ref 43–77)
Platelets: 290 10*3/uL (ref 150–400)
RBC: 4.99 MIL/uL (ref 3.87–5.11)
RDW: 13 % (ref 11.5–15.5)
WBC: 8.2 10*3/uL (ref 4.0–10.5)

## 2014-11-30 LAB — RAPID URINE DRUG SCREEN, HOSP PERFORMED
Amphetamines: NOT DETECTED
BARBITURATES: NOT DETECTED
Benzodiazepines: NOT DETECTED
Cocaine: POSITIVE — AB
Opiates: NOT DETECTED
Tetrahydrocannabinol: NOT DETECTED

## 2014-11-30 LAB — COMPREHENSIVE METABOLIC PANEL
ALBUMIN: 5 g/dL (ref 3.5–5.0)
ALT: 19 U/L (ref 14–54)
AST: 25 U/L (ref 15–41)
Alkaline Phosphatase: 68 U/L (ref 38–126)
Anion gap: 11 (ref 5–15)
BUN: 17 mg/dL (ref 6–20)
CO2: 26 mmol/L (ref 22–32)
CREATININE: 1.33 mg/dL — AB (ref 0.44–1.00)
Calcium: 9.9 mg/dL (ref 8.9–10.3)
Chloride: 97 mmol/L — ABNORMAL LOW (ref 101–111)
GFR calc Af Amer: 59 mL/min — ABNORMAL LOW (ref >60)
GFR calc non Af Amer: 51 mL/min — ABNORMAL LOW (ref >60)
GLUCOSE: 120 mg/dL — AB (ref 65–99)
Potassium: 3.9 mmol/L (ref 3.5–5.1)
SODIUM: 134 mmol/L — AB (ref 135–145)
Total Bilirubin: 1.1 mg/dL (ref 0.3–1.2)
Total Protein: 8.5 g/dL — ABNORMAL HIGH (ref 6.5–8.1)

## 2014-11-30 LAB — URINALYSIS, ROUTINE W REFLEX MICROSCOPIC
BILIRUBIN URINE: NEGATIVE
GLUCOSE, UA: NEGATIVE mg/dL
Hgb urine dipstick: NEGATIVE
Ketones, ur: NEGATIVE mg/dL
LEUKOCYTES UA: NEGATIVE
NITRITE: NEGATIVE
Protein, ur: NEGATIVE mg/dL
SPECIFIC GRAVITY, URINE: 1 — AB (ref 1.005–1.030)
UROBILINOGEN UA: 0.2 mg/dL (ref 0.0–1.0)
pH: 6 (ref 5.0–8.0)

## 2014-11-30 LAB — ACETAMINOPHEN LEVEL: Acetaminophen (Tylenol), Serum: <10 ug/mL — ABNORMAL LOW (ref 10–30)

## 2014-11-30 LAB — SALICYLATE LEVEL: Salicylate Lvl: <4 mg/dL (ref 2.8–30.0)

## 2014-11-30 LAB — PREGNANCY, URINE: PREG TEST UR: NEGATIVE

## 2014-11-30 LAB — ETHANOL: Alcohol, Ethyl (B): <5 mg/dL (ref <5)

## 2014-11-30 MED ORDER — ZOLPIDEM TARTRATE 5 MG PO TABS
5.0000 mg | ORAL_TABLET | Freq: Every evening | ORAL | Status: DC | PRN
  Administered 2014-11-30: 5 mg via ORAL
  Filled 2014-11-30: qty 1

## 2014-11-30 MED ORDER — LORAZEPAM 2 MG/ML IJ SOLN
INTRAMUSCULAR | Status: AC
  Filled 2014-11-30: qty 1

## 2014-11-30 MED ORDER — ZOLPIDEM TARTRATE 5 MG PO TABS: 5.0000 mg | ORAL_TABLET | Freq: Every evening | ORAL | Status: DC | PRN

## 2014-11-30 MED ORDER — DIPHENHYDRAMINE HCL 50 MG/ML IJ SOLN
INTRAMUSCULAR | Status: AC
  Filled 2014-11-30: qty 1

## 2014-11-30 MED ORDER — ZIPRASIDONE MESYLATE 20 MG IM SOLR
INTRAMUSCULAR | Status: AC
  Filled 2014-11-30: qty 20

## 2014-11-30 MED ORDER — ACETAMINOPHEN 325 MG PO TABS: 650.0000 mg | ORAL_TABLET | ORAL | Status: DC | PRN

## 2014-11-30 MED ORDER — ZIPRASIDONE HCL 20 MG PO CAPS: 40.0000 mg | ORAL_CAPSULE | Freq: Two times a day (BID) | ORAL | Status: DC

## 2014-11-30 MED ORDER — ONDANSETRON HCL 4 MG PO TABS: 4.0000 mg | ORAL_TABLET | Freq: Three times a day (TID) | ORAL | Status: DC | PRN

## 2014-11-30 MED ORDER — ALUM & MAG HYDROXIDE-SIMETH 200-200-20 MG/5ML PO SUSP: 30.0000 mL | ORAL | Status: DC | PRN
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
## 2014-11-30 NOTE — ED Notes (Signed)
Requested patient to urinate. 
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
## 2014-11-30 NOTE — BHH Suicide Risk Assessment (Signed)
Suicide Risk Assessment  Discharge Assessment   [facility] Discharge Suicide Risk Assessment   Demographic Factors:  NA  Total Time spent with patient: 45 minutes  Musculoskeletal: Strength & Muscle Tone: within normal limits Gait & Station: normal Patient leans: N/A  Psychiatric Specialty Exam:     Blood pressure 128/95, pulse 97, temperature 98.2 F (36.8 C), temperature source Oral, resp. rate 16, SpO2 98 %, not currently breastfeeding.There is no weight on file to calculate BMI.  General Appearance: Disheveled  Eye Contact::  Good  Speech:  Normal Rate409  Volume:  Normal  Mood:  Angry and Irritable  Affect:  Congruent  Thought Process:  Coherent  Orientation:  Full (Time, Place, and Person)  Thought Content:  WDL  Suicidal Thoughts:  No  Homicidal Thoughts:  No  Memory:  Immediate;   Good Recent;   Good Remote;   Good  Judgement:  Poor  Insight:  Lacking  Psychomotor Activity:  Increased  Concentration:  Fair  Recall:  Fair  Fund of Knowledge:Good  Language: Good  Akathisia:  No  Handed:  Right  AIMS (if indicated):     Assets:  Communication Skills Leisure Time Physical Health Resilience  Sleep:     Cognition: WNL  ADL's:  Intact      Has this patient used any form of tobacco in the last 30 days? (Cigarettes, Smokeless Tobacco, Cigars, and/or Pipes) Yes, A prescription for an FDA-approved tobacco cessation medication was offered at discharge and the patient refused  Mental Status Per Nursing Assessment::   On Admission:   Aggressive, demanding of certain services  Current Mental Status by Physician: NA  Loss Factors: NA  Historical Factors: Impulsivity  Risk Reduction Factors:   Sense of responsibility to family, Positive social support and Positive therapeutic relationship  Continued Clinical Symptoms:  Bipolar Affective Disorder  Cognitive Features That Contribute To Risk:  None    Suicide Risk:  Minimal: No identifiable suicidal ideation.   Patients presenting with no risk factors but with morbid ruminations; may be classified as minimal risk based on the severity of the depressive symptoms  Principal Problem: <principal problem not specified> Discharge Diagnoses:  Patient Active Problem List   Diagnosis Date Noted  . Bipolar affective disorder, current episode manic with psychotic symptoms [F31.2] 03/16/2014    Priority: High  . Bipolar disorder, current episode manic without psychotic features, severe [F31.13]   . Agitation [R45.1]   . Manic behavior [F30.10]   . Psychosis [F29]   . Hyperthyroidism [E05.90] 09/21/2013  . Marijuana abuse [F12.10] 04/12/2013  . History of CHF (congestive heart failure) [Z86.79] 02/01/2013  . Abscess of abdominal wall [L02.211] 10/18/2012  . HTN (hypertension) [I10] 04/08/2011  . Tachycardia [R00.0] 04/08/2011      Plan Of Care/Follow-up recommendations:  Activity:  as tolerated Diet:  heart healthy diet  Is patient on multiple antipsychotic therapies at discharge:  No   Has Patient had three or more failed trials of antipsychotic monotherapy by history:  No  Recommended Plan for Multiple Antipsychotic Therapies: NA    Shelly Spenser, PMH-NP 11/30/2014, 1:13 PM

## 2014-11-30 NOTE — Progress Notes (Signed)
Disposition CSW completed referrals to the following inpatient psych placements:  [facility] [place]   CSW will continue to assist with patient's disposition needs.  [facility] Disposition CSW [phone]

## 2014-11-30 NOTE — ED Notes (Signed)
Patient stated she has not ate or had her medicine in the last three days.

## 2014-11-30 NOTE — ED Notes (Signed)
Patient states "I don't want to hurt myself.  I want to get to [facility]. I need a bus pass."  Difficult to redirect.

## 2014-11-30 NOTE — BH Assessment (Signed)
Received notification of TTS consult request. Spoke to Dr. Marisa Severin who said Pt is agitated, yelling and uncooperative. Tele-assessment will be initiated.  Harlin Rain Patsy [place], LPC, [facility], [facility] Triage Specialist [phone]

## 2014-11-30 NOTE — ED Notes (Signed)
Pt. In burgundy scrubs. Pt. And belongings searched and wanded by security. Pt has 1 belongings bag. Pt. Has black flip flops, red bra, 1 blue jeans, t-shirt. Pt. Belongings locked up in the TCU near the psych-ed in [place] [address].

## 2014-11-30 NOTE — ED Notes (Signed)
EKG given to EDP,Gentry,MD., for review. 

## 2014-11-30 NOTE — ED Notes (Signed)
Pt. Attempted urine on bed pan but unsuccessful.
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
## 2014-12-19 ENCOUNTER — Emergency Department (HOSPITAL_COMMUNITY)
Admission: EM | Admit: 2014-12-19 | Discharge: 2014-12-25 | Disposition: A | Discharge status: Home / Self Care | Payer: Medicare Other | Attending: Emergency Medicine | Admitting: Emergency Medicine

## 2014-12-19 ENCOUNTER — Encounter (HOSPITAL_COMMUNITY): Payer: Self-pay | Admitting: Emergency Medicine

## 2014-12-19 DIAGNOSIS — Z8639 Personal history of other endocrine, nutritional and metabolic disease: Secondary | ICD-10-CM | POA: Diagnosis not present

## 2014-12-19 DIAGNOSIS — F312 Bipolar disorder, current episode manic severe with psychotic features: Secondary | ICD-10-CM | POA: Diagnosis not present

## 2014-12-19 DIAGNOSIS — I509 Heart failure, unspecified: Secondary | ICD-10-CM | POA: Insufficient documentation

## 2014-12-19 DIAGNOSIS — I10 Essential (primary) hypertension: Secondary | ICD-10-CM | POA: Insufficient documentation

## 2014-12-19 DIAGNOSIS — F3113 Bipolar disorder, current episode manic without psychotic features, severe: Secondary | ICD-10-CM

## 2014-12-19 DIAGNOSIS — F911 Conduct disorder, childhood-onset type: Secondary | ICD-10-CM | POA: Insufficient documentation

## 2014-12-19 DIAGNOSIS — Z046 Encounter for general psychiatric examination, requested by authority: Secondary | ICD-10-CM | POA: Diagnosis present

## 2014-12-19 DIAGNOSIS — Z720 Tobacco use: Secondary | ICD-10-CM | POA: Insufficient documentation

## 2014-12-19 DIAGNOSIS — F29 Unspecified psychosis not due to a substance or known physiological condition: Secondary | ICD-10-CM | POA: Diagnosis not present

## 2014-12-19 DIAGNOSIS — F3110 Bipolar disorder, current episode manic without psychotic features, unspecified: Secondary | ICD-10-CM | POA: Diagnosis present

## 2014-12-19 MED ORDER — HALOPERIDOL LACTATE 5 MG/ML IJ SOLN
5.0000 mg | Freq: Once | INTRAMUSCULAR | Status: AC
  Administered 2014-12-19: 5 mg via INTRAMUSCULAR
  Filled 2014-12-19: qty 1

## 2014-12-19 MED ORDER — ZIPRASIDONE MESYLATE 20 MG IM SOLR
INTRAMUSCULAR | Status: AC
  Filled 2014-12-19: qty 20

## 2014-12-19 MED ORDER — LORAZEPAM 2 MG/ML IJ SOLN
2.0000 mg | Freq: Once | INTRAMUSCULAR | Status: AC
  Administered 2014-12-19: 2 mg via INTRAMUSCULAR
  Filled 2014-12-19: qty 1

## 2014-12-19 MED ORDER — STERILE WATER FOR INJECTION IJ SOLN
INTRAMUSCULAR | Status: AC
  Administered 2014-12-19: 16:00:00
  Filled 2014-12-19: qty 10

## 2014-12-19 MED ORDER — ZIPRASIDONE MESYLATE 20 MG IM SOLR
20.0000 mg | Freq: Once | INTRAMUSCULAR | Status: AC
  Administered 2014-12-19: 20 mg via INTRAMUSCULAR

## 2014-12-19 NOTE — ED Notes (Signed)
Bed: CB63 Expected date:  Expected time:  Means of arrival:  Comments: Room 15
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
## 2014-12-19 NOTE — ED Notes (Signed)
Positive response from Geodon-sitter at door, [facility] department present
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
## 2014-12-19 NOTE — ED Notes (Signed)
Bed: HN88 Expected date:  Expected time:  Means of arrival:  Comments: EMS-combative

## 2014-12-19 NOTE — ED Notes (Signed)
Per IVC-has been off meds-was in jail acting erratic-released from jail to come here and be evaluated

## 2014-12-20 DIAGNOSIS — F29 Unspecified psychosis not due to a substance or known physiological condition: Secondary | ICD-10-CM | POA: Insufficient documentation

## 2014-12-20 DIAGNOSIS — F312 Bipolar disorder, current episode manic severe with psychotic features: Secondary | ICD-10-CM | POA: Diagnosis not present

## 2014-12-20 MED ORDER — IBUPROFEN 800 MG PO TABS
800.0000 mg | ORAL_TABLET | Freq: Two times a day (BID) | ORAL | Status: DC | PRN
  Administered 2014-12-20 – 2014-12-25 (×9): 800 mg via ORAL
  Filled 2014-12-20 (×9): qty 1

## 2014-12-20 MED ORDER — ZOLPIDEM TARTRATE 5 MG PO TABS
5.0000 mg | ORAL_TABLET | Freq: Every evening | ORAL | Status: DC | PRN
  Administered 2014-12-20: 5 mg via ORAL
  Filled 2014-12-20: qty 1

## 2014-12-20 MED ORDER — WHITE PETROLATUM GEL
Status: DC | PRN
  Filled 2014-12-20: qty 28.35

## 2014-12-20 MED ORDER — ZIPRASIDONE HCL 20 MG PO CAPS
40.0000 mg | ORAL_CAPSULE | Freq: Two times a day (BID) | ORAL | Status: DC
  Administered 2014-12-20 – 2014-12-22 (×5): 40 mg via ORAL
  Filled 2014-12-20 (×5): qty 2

## 2014-12-20 MED ORDER — NICOTINE 21 MG/24HR TD PT24
21.0000 mg | MEDICATED_PATCH | Freq: Once | TRANSDERMAL | Status: DC
Start: 2014-12-20 — End: 2014-12-25
  Filled 2014-12-20: qty 1

## 2014-12-20 MED ORDER — AMANTADINE HCL 100 MG PO CAPS
100.0000 mg | ORAL_CAPSULE | Freq: Two times a day (BID) | ORAL | Status: DC
  Administered 2014-12-20 – 2014-12-25 (×10): 100 mg via ORAL
  Filled 2014-12-20 (×11): qty 1

## 2014-12-20 MED ORDER — ZIPRASIDONE MESYLATE 20 MG IM SOLR
INTRAMUSCULAR | Status: AC
  Filled 2014-12-20: qty 20

## 2014-12-20 MED ORDER — ZIPRASIDONE MESYLATE 20 MG IM SOLR
20.0000 mg | Freq: Once | INTRAMUSCULAR | Status: AC
  Administered 2014-12-20: 20 mg via INTRAMUSCULAR

## 2014-12-20 MED ORDER — NICOTINE POLACRILEX 2 MG MT GUM
2.0000 mg | CHEWING_GUM | OROMUCOSAL | Status: DC | PRN
  Administered 2014-12-20 – 2014-12-25 (×16): 2 mg via ORAL
  Filled 2014-12-20 (×16): qty 1

## 2014-12-20 MED ORDER — LORAZEPAM 1 MG PO TABS
1.0000 mg | ORAL_TABLET | Freq: Three times a day (TID) | ORAL | Status: DC | PRN
  Administered 2014-12-20 – 2014-12-24 (×8): 1 mg via ORAL
  Filled 2014-12-20 (×7): qty 1

## 2014-12-20 NOTE — ED Provider Notes (Signed)
Was called regarding patient becoming agitated.  Required geodon, haldol and ativan last night.  Patient trying to remove restraints.  Geodon reordered.  Shon Baton, MD 12/20/14 [phone]

## 2014-12-20 NOTE — ED Notes (Signed)
Pt taken out of restraints and offered pt to take a shower.  Pt picked up supplies and ambulated to the restroom.

## 2014-12-20 NOTE — ED Notes (Signed)
Pt calmly eating and watching TV.

## 2014-12-20 NOTE — ED Notes (Signed)
Patient is pleasant, redirectable. Delusions of grandeur, rapid speech. Reports low back pain and lip pain. Blister present inside of lower lip. Given salt water flushes.  Encouragement offered.    Q 15 safety checks in place.
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
## 2014-12-20 NOTE — BH Assessment (Signed)
[facility] Assessment Progress Note  Spoke with Dr. Littie Deeds and took history of pt. Teleassessment will begin soon; Jessie Foot took machine in the room.

## 2014-12-20 NOTE — ED Notes (Addendum)
Pt woke up and is currently eating her food.  Pt calm. 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 Explained to pt she was at the hospital.

## 2014-12-20 NOTE — BH Assessment (Signed)
[facility] Assessment Progress Note  The following facilities have been contacted to seek placement for this pt, with results as noted:  Beds available, information sent, decision pending:  [facility]  At capacity:  [facility], [place] Triage Specialist [phone]

## 2014-12-20 NOTE — ED Notes (Signed)
Pt reports a sore on her lip.  It appears to be a canker sore.  Pt also requesting morphine or vicodin for her back pain.

## 2014-12-20 NOTE — ED Notes (Signed)
Pt requesting to the use the phone to call.  Pt states she needs to get back to the jail.  Pt otherwise calm.

## 2014-12-20 NOTE — Consult Note (Signed)
[facility] Face-to-Face Psychiatry Consult   Reason for Consult:  Psychotic, Delusion Referring Physician: EDP Patient Identification: Christine Cobb MRN:  [id number] Principal Diagnosis: Bipolar affective disorder, current episode manic with psychotic symptoms Diagnosis:   Patient Active Problem List   Diagnosis Date Noted  . Bipolar affective disorder, current episode manic with psychotic symptoms [F31.2] 03/16/2014    Priority: High  . Bipolar disorder, current episode manic without psychotic features, severe [F31.13]   . Agitation [R45.1]   . Manic behavior [F30.10]   . Psychosis [F29]   . Hyperthyroidism [E05.90] 09/21/2013  . Marijuana abuse [F12.10] 04/12/2013  . History of CHF (congestive heart failure) [Z86.79] 02/01/2013  . Abscess of abdominal wall [L02.211] 10/18/2012  . HTN (hypertension) [I10] 04/08/2011  . Tachycardia [R00.0] 04/08/2011    Total Time spent with patient: 45 minutes  Subjective:   Christine Cobb is a 39 y.o. female patient admitted with Psychosis, Delusion.  HPI:  AA female, 40 years old was evaluated for symptoms of Psychosis, yelling, aggressive behavior towards staff and  disorganized  While in jail.  She was placed on 4 point restraint on arrival and Geodon injection was given as she continued to exhibit agitation and disorganized behavior..  Patient,  as soon as she woke up remained  disorganized and could not participate in the interview.  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 Patient cannot stay in her room for full interview and evaluation at this time..  Patient is asking for a discharge to go to work.  She has been accepted for admission and we will be seeking  placement at any facility with available inpatient Psychiatric bed.    HPI Elements:   Location:  Bipolar affective disorder, current episode is manic. Quality:  severe, severe delusion of grandeur. Severity:  severe. Timing:  Acute. Duration:  Chronic mental illness. Context:  IVC from jail for agitataion..  Past Medical History:  Past Medical History  Diagnosis Date  . Sinus tachycardia   . HTN (hypertension)   . Arm pain   . Eye globe prosthesis   . CHF (congestive heart failure)   . Bipolar affective disorder, currently manic, mild   . Hyperthyroidism     Past Surgical History  Procedure Laterality Date  . Eye surgery    . Dilation and curettage of uterus     Family History:  Family History  Problem Relation Age of Onset  . Hypertension Other   . Emphysema Other   . Asthma Son   . Diabetes Maternal Uncle   . Diabetes Paternal Grandmother    Social History:  History  Alcohol Use No     History  Drug Use  . Yes  . Special: Marijuana    Comment: over a month since used marijuana    History   Social History  . Marital Status: Married    Spouse Name: N/A  . Number of Children: N/A  . Years of Education: N/A   Social History Main Topics  . Smoking status: Current Every Day Smoker -- 1.00 packs/day    Types: Cigarettes    Last Attempt to Quit: 08/21/2013  . Smokeless  tobacco: Never Used  . Alcohol Use: No  . Drug Use: Yes    Special: Marijuana     Comment: over a month since used marijuana  . Sexual Activity: Yes    Birth Control/ Protection: Injection   Other Topics Concern  . None   Social History Narrative   Additional Social History:    Pain Medications: UTA Prescriptions: UTA Over the Counter: UTA History of alcohol / drug use?: Yes Longest period of sobriety (when/how long): UTA Negative Consequences of Use:  (UTA) Withdrawal Symptoms:  (UTA) Name of Substance 1: Alcohol  (per previous assessment) 1 - Age of First Use: 15 1 - Amount  (size/oz): "1 beer"  1 - Frequency: unknown  1 - Duration: ongoing  1 - Last Use / Amount: UTA                   Allergies:   Allergies  Allergen Reactions  . Amoxil [Amoxicillin] Anaphylaxis  . Trazodone And Nefazodone Anaphylaxis  . Ketorolac Tromethamine Hives and Swelling  . Prenatal [B-Plex Plus] Nausea Only  . Tegretol [Carbamazepine] Other (See Comments)    Swelling, skin peeling, skin discoloration    Labs: No results found for this or any previous visit (from the past 48 hour(s)).  Vitals: Blood pressure 101/75, pulse 101, temperature 98.1 F (36.7 C), temperature source Oral, resp. rate 16, SpO2 98 %, not currently breastfeeding.  Risk to Self: Suicidal Ideation: No Suicidal Intent: No Is patient at risk for suicide?: Yes Suicidal Plan?: No Access to Means:  (UTA) What has been your use of drugs/alcohol within the last 12 months?:  (UTA) Other Self Harm Risks:  (UTA) Triggers for Past Attempts:  (UTA) Intentional Self Injurious Behavior:  (UTA) Risk to Others: Homicidal Ideation: No Thoughts of Harm to Others: No Current Homicidal Intent: No Current Homicidal Plan: No Access to Homicidal Means: No History of harm to others?:  (UTA) Assessment of Violence: On admission Violent Behavior Description:  (in jail, in ED) Does patient have access to weapons?:  (UTA) Criminal Charges Pending?:  (UTA) Does patient have a court date:  (UTA) Prior Inpatient Therapy: Prior Inpatient Therapy: Yes Prior Therapy Dates: Dates unknown  Prior Therapy Facilty/Provider(s): [place]  Reason for Treatment: Psychosis  Prior Outpatient Therapy: Prior Outpatient Therapy:  (UTA) Does patient have an ACCT team?: Unknown Does patient have Intensive In-House Services?  : No Does patient have [facility] services? : Unknown Does patient have P4CC services?: No  Current Facility-Administered Medications  Medication Dose Route Frequency Provider Last Rate Last Dose  . amantadine  (SYMMETREL) capsule 100 mg  100 mg Oral BID [name], NP      . LORazepam (ATIVAN) tablet 1 mg  1 mg Oral Q8H PRN [name], NP   1 mg at 12/20/14 1734  . ziprasidone (GEODON) capsule 40 mg  40 mg Oral BID WC [name], NP   40 mg at 12/20/14 1734  . zolpidem (AMBIEN) tablet 5 mg  5 mg Oral QHS PRN [name], NP       Current Outpatient Prescriptions  Medication Sig Dispense Refill  . ALPRAZolam (XANAX) 1 MG tablet Take 1 tablet (1 mg total) by mouth at bedtime as needed for anxiety. (Patient not taking: Reported on 07/05/2014) 10 tablet 0  . furosemide (LASIX) 20 MG tablet Take 1 tablet (20 mg total) by mouth daily. (Patient not taking: Reported on 06/01/2014) 10 tablet 0  . medroxyPROGESTERone (PROVERA) 10  MG tablet Take 1 tablet (10 mg total) by mouth daily. (Patient not taking: Reported on 06/01/2014) 30 tablet 0    Musculoskeletal: Strength & Muscle Tone: within normal limits Gait & Station: normal Patient leans: N/A  Psychiatric Specialty Exam: Physical Exam  Review of Systems  Unable to perform ROS: mental acuity  Constitutional: Negative.   HENT: Negative.   Eyes: Negative.   Cardiovascular: Negative.   Gastrointestinal: Negative.   Genitourinary: Negative.   Musculoskeletal: Negative.   Skin: Negative.   Neurological: Negative.   Endo/Heme/Allergies: Negative.     Blood pressure 101/75, pulse 101, temperature 98.1 F (36.7 C), temperature source Oral, resp. rate 16, SpO2 98 %, not currently breastfeeding.There is no weight on file to calculate BMI.  General Appearance: Casual  Eye Contact::  Good  Speech:  Pressured  Volume:  Increased  Mood:  Anxious, Euphoric and Irritable  Affect:  Congruent and Labile  Thought Process:  Disorganized  Orientation:  Other:  oriented to self only  Thought Content:  Delusions and Hallucinations: Auditory Visual  Suicidal Thoughts:  unable to obtain  Homicidal Thoughts:  unable to obtain  Memory:   Immediate;   Poor Recent;   Poor Remote;   Poor  Judgement:  Poor  Insight:  Lacking  Psychomotor Activity:  Restlessness  Concentration:  Poor  Recall:  NA  Fund of Knowledge:Poor  Language: Poor  Akathisia:  NA  Handed:  Right  AIMS (if indicated):     Assets:  Desire for Improvement  ADL's:  Intact  Cognition: Impaired,  Severe  Sleep:      Medical Decision Making: Review of Psycho-Social Stressors (1)  Treatment Plan Summary: Daily contact with patient to assess and evaluate symptoms and progress in treatment and Medication management  Plan:  Resume all home medications.  We will use Ativan for agitation.   Disposition: Admit and seek placement  [name]   PMHNP-BC 12/20/2014 5:36 PM Patient seen face-to-face for psychiatric evaluation, chart reviewed and case discussed with the physician extender and developed treatment plan. Reviewed the information documented and agree with the treatment plan. Thedore Mins, MD

## 2014-12-20 NOTE — BH Assessment (Addendum)
Tele Assessment Note   Christine Cobb is an 39 y.o. female who came to the ED from jail (reportedly from Rn for assault on an officer in the ED) with bizarre behaviors including painting the walls with feces and her tampon.  Pt has been unable to participate in assessment due to mental status and sedation, but was able to speak with writer briefly before she became uncooperative/drowsy again.  Pt was lying in the hospital bed eating her breakfast, and stated that she had "not eaten for 4 days".  She states she does not know why she is at the hospital, where she came from, and does not know what day or date it is, but does know that it is 2016.  She had disheveled appearance with restless movement and poor eye contact. She admits to depression, denies SI or HI. Unable to assess for suicide history, but pt is at rist due to mental status.  Pt has a history of violent behavior per ED notes/jail history. Pt denies current hallucinations, but had pressured speech and flight of ideas, saying, "I am a Emergency planning/management officer, I am a CNA, I am a teacher, you just need to talk to the police, to the K9 unit, I just need to get back to the police station, I have not eaten in 4 days, I am not going to talk to you anymore...."  Pt then laid down and would not make eye contact and speech was unintelligible.  Unable to assess for memory due to mental status.   Pt has had frequent ED visits and per chart was also in jail earlier last week as well.  Per chart, there is a history of admission at [facility], but Clinical research associate is unable to assess for full hx of OP/IP treatment due to pt's mental status. Writer unable to assess for social SA history, but there is some hx of alcohol use in previous evaluations, and pt was asking for pain medications in previous visits.  Rn is seeking more legal history from [employer]. Psych rounding will determine disposition.  Axis I: Psychotic Disorder NOS Axis II: Deferred Axis III:  Past Medical History   Diagnosis Date  . Sinus tachycardia   . HTN (hypertension)   . Arm pain   . Eye globe prosthesis   . CHF (congestive heart failure)   . Bipolar affective disorder, currently manic, mild   . Hyperthyroidism    Axis IV: problems related to legal system/crime and problems related to social environment Axis V: 21-30 behavior considerably influenced by delusions or hallucinations OR serious impairment in judgment, communication OR inability to function in almost all areas  Past Medical History:  Past Medical History  Diagnosis Date  . Sinus tachycardia   . HTN (hypertension)   . Arm pain   . Eye globe prosthesis   . CHF (congestive heart failure)   . Bipolar affective disorder, currently manic, mild   . Hyperthyroidism     Past Surgical History  Procedure Laterality Date  . Eye surgery    . Dilation and curettage of uterus      Family History:  Family History  Problem Relation Age of Onset  . Hypertension Other   . Emphysema Other   . Asthma Son   . Diabetes Maternal Uncle   . Diabetes Paternal Grandmother     Social History:  reports that she has been smoking Cigarettes.  She has been smoking about 1.00 pack per day. She has never used smokeless tobacco. She  reports that she uses illicit drugs (Marijuana). She reports that she does not drink alcohol.  Additional Social History:  Alcohol / Drug Use Pain Medications: UTA Prescriptions: UTA Over the Counter: UTA History of alcohol / drug use?: Yes Longest period of sobriety (when/how long): UTA Negative Consequences of Use:  (UTA) Withdrawal Symptoms:  (UTA) Substance #1 Name of Substance 1: Alcohol  (per previous assessment) 1 - Age of First Use: 15 1 - Amount (size/oz): "1 beer"  1 - Frequency: unknown  1 - Duration: ongoing  1 - Last Use / Amount: UTA  CIWA: CIWA-Ar BP: 107/78 mmHg Pulse Rate: 109 COWS:    PATIENT STRENGTHS: (choose at least two) Average or above average intelligence Communication  skills  Allergies:  Allergies  Allergen Reactions  . Amoxil [Amoxicillin] Anaphylaxis  . Trazodone And Nefazodone Anaphylaxis  . Ketorolac Tromethamine Hives and Swelling  . Prenatal [B-Plex Plus] Nausea Only  . Tegretol [Carbamazepine] Other (See Comments)    Swelling, skin peeling, skin discoloration    Home Medications:  (Not in a hospital admission)  OB/GYN Status:  No LMP recorded.  General Assessment Data Location of Assessment: [facility] TTS Assessment: In system Is this a Tele or Face-to-Face Assessment?: Tele Assessment Is this an Initial Assessment or a Re-assessment for this encounter?: Initial Assessment Marital status: Single Is patient pregnant?: Unknown Pregnancy Status: Unknown Living Arrangements:  (UTA) Can pt return to current living arrangement?:  (UTA) Admission Status: Involuntary Is patient capable of signing voluntary admission?: Yes Referral Source:  (came from jail) Insurance type:  [facility])  Medical Screening Exam [facility] Walk-in ONLY) Medical Exam completed:  (UTA)  Crisis Care Plan Living Arrangements:  (UTA) Name of Psychiatrist:  (UTA) Name of Therapist:  Industrial/product designer)  Education Status Is patient currently in school?: No Highest grade of school patient has completed: multiple degrees (pt reports)  Risk to self with the past 6 months Suicidal Ideation: No Has patient been a risk to self within the past 6 months prior to admission? : No Suicidal Intent: No Has patient had any suicidal intent within the past 6 months prior to admission? :  (UTA) Is patient at risk for suicide?: Yes Suicidal Plan?: No Has patient had any suicidal plan within the past 6 months prior to admission? :  (UTA) Access to Means:  (UTA) What has been your use of drugs/alcohol within the last 12 months?:  (UTA) Previous Attempts/Gestures:  (UTA) Other Self Harm Risks:  (UTA) Triggers for Past Attempts:  (UTA) Intentional Self Injurious Behavior:  (UTA) Family Suicide  History: Unable to assess Recent stressful life event(s): Legal Issues (UTA) Persecutory voices/beliefs?: Yes Depression: Yes Depression Symptoms:  (UTA) Substance abuse history and/or treatment for substance abuse?: Yes Suicide prevention information given to non-admitted patients: Not applicable  Risk to Others within the past 6 months Homicidal Ideation: No Does patient have any lifetime risk of violence toward others beyond the six months prior to admission? : No Thoughts of Harm to Others: No Current Homicidal Intent: No Current Homicidal Plan: No Access to Homicidal Means: No History of harm to others?:  (UTA) Assessment of Violence: On admission Violent Behavior Description:  (in jail, in ED) Does patient have access to weapons?:  (UTA) Criminal Charges Pending?:  (UTA) Does patient have a court date:  (UTA) Is patient on probation?: Unknown  Psychosis Hallucinations:  (pt disoreinted, appears to be under the influence of psychos) Delusions: Grandiose  Mental Status Report Appearance/Hygiene: In scrubs Eye Contact: Poor  Motor Activity: Restlessness Speech: Incoherent, Pressured, Tangential Level of Consciousness: Drowsy, Sedated Mood: Depressed, Anxious, Irritable Affect: Irritable, Depressed Anxiety Level: Moderate Panic attack frequency:  (UTA) Most recent panic attack: UTA Thought Processes: Flight of Ideas, Irrelevant Judgement: Impaired Orientation:  (oriented to person, year) Obsessive Compulsive Thoughts/Behaviors: Moderate  Cognitive Functioning Memory: Recent Impaired, Unable to Assess IQ: Average Insight: Poor Impulse Control: Poor Appetite:  (UTA) Weight Loss:  (UTA) Weight Gain:  (UTA) Sleep: Unable to Assess Total Hours of Sleep:  (UTA) Vegetative Symptoms: Unable to Assess  ADLScreening [facility] Assessment Services) Patient's cognitive ability adequate to safely complete daily activities?: Yes Patient able to express need for assistance with  ADLs?: Yes Independently performs ADLs?: Yes (appropriate for developmental age)  Prior Inpatient Therapy Prior Inpatient Therapy: Yes Prior Therapy Dates: Dates unknown  Prior Therapy Facilty/Provider(s): [place]  Reason for Treatment: Psychosis   Prior Outpatient Therapy Prior Outpatient Therapy:  (UTA) Does patient have an ACCT team?: Unknown Does patient have Intensive In-House Services?  : No Does patient have [facility] services? : Unknown Does patient have P4CC services?: No  ADL Screening (condition at time of admission) Patient's cognitive ability adequate to safely complete daily activities?: Yes Is the patient deaf or have difficulty hearing?: No Does the patient have difficulty seeing, even when wearing glasses/contacts?: No Does the patient have difficulty concentrating, remembering, or making decisions?: No Patient able to express need for assistance with ADLs?: Yes Does the patient have difficulty dressing or bathing?: No Independently performs ADLs?: Yes (appropriate for developmental age) Weakness of Legs: None Weakness of Arms/Hands: None  Home Assistive Devices/Equipment Home Assistive Devices/Equipment: None    Abuse/Neglect Assessment (Assessment to be complete while patient is alone) Physical Abuse:  (UTA) Verbal Abuse:  (UTA) Sexual Abuse:  (UTA) Exploitation of patient/patient's resources:  (UTA) Self-Neglect:  (UTA) Values / Beliefs Cultural Requests During Hospitalization: None Spiritual Requests During Hospitalization: None   Advance Directives (For Healthcare) Does patient have an advance directive?: No Would patient like information on creating an advanced directive?: No - patient declined information    Additional Information 1:1 In Past 12 Months?: No CIRT Risk: Yes Elopement Risk: Yes Does patient have medical clearance?: Yes     Disposition:  Disposition Initial Assessment Completed for this Encounter: Yes Disposition of Patient:   (pending psych rounding) Type of inpatient treatment program:  (pending psych consult)  [facility] 12/20/2014 9:54 AM

## 2014-12-21 DIAGNOSIS — F312 Bipolar disorder, current episode manic severe with psychotic features: Secondary | ICD-10-CM | POA: Diagnosis not present

## 2014-12-21 LAB — CBC
HCT: 35.4 % — ABNORMAL LOW (ref 36.0–46.0)
Hemoglobin: 11.8 g/dL — ABNORMAL LOW (ref 12.0–15.0)
MCH: 28.9 pg (ref 26.0–34.0)
MCHC: 33.3 g/dL (ref 30.0–36.0)
MCV: 86.6 fL (ref 78.0–100.0)
Platelets: 298 10*3/uL (ref 150–400)
RBC: 4.09 MIL/uL (ref 3.87–5.11)
RDW: 13.8 % (ref 11.5–15.5)
WBC: 5.4 10*3/uL (ref 4.0–10.5)

## 2014-12-21 LAB — URINALYSIS, ROUTINE W REFLEX MICROSCOPIC
Bilirubin Urine: NEGATIVE
Glucose, UA: NEGATIVE mg/dL
KETONES UR: NEGATIVE mg/dL
LEUKOCYTES UA: NEGATIVE
NITRITE: NEGATIVE
PH: 6 (ref 5.0–8.0)
Protein, ur: NEGATIVE mg/dL
Specific Gravity, Urine: 1.008 (ref 1.005–1.030)
UROBILINOGEN UA: 0.2 mg/dL (ref 0.0–1.0)

## 2014-12-21 LAB — LIPID PANEL
CHOL/HDL RATIO: 2.3 ratio
CHOLESTEROL: 121 mg/dL (ref 0–200)
HDL: 53 mg/dL (ref >40)
LDL Cholesterol: 55 mg/dL (ref 0–99)
TRIGLYCERIDES: 66 mg/dL (ref <150)
VLDL: 13 mg/dL (ref 0–40)

## 2014-12-21 LAB — RAPID URINE DRUG SCREEN, HOSP PERFORMED
Amphetamines: NOT DETECTED
BARBITURATES: NOT DETECTED
Benzodiazepines: POSITIVE — AB
COCAINE: NOT DETECTED
Opiates: NOT DETECTED
TETRAHYDROCANNABINOL: NOT DETECTED

## 2014-12-21 LAB — URINE MICROSCOPIC-ADD ON

## 2014-12-21 LAB — SALICYLATE LEVEL: Salicylate Lvl: <4 mg/dL (ref 2.8–30.0)

## 2014-12-21 LAB — ACETAMINOPHEN LEVEL

## 2014-12-21 LAB — TSH: TSH: 0.081 u[IU]/mL — ABNORMAL LOW (ref 0.350–4.500)

## 2014-12-21 MED ORDER — HYDROXYZINE HCL 25 MG PO TABS
50.0000 mg | ORAL_TABLET | Freq: Once | ORAL | Status: AC
  Administered 2014-12-22: 50 mg via ORAL
  Filled 2014-12-21 (×2): qty 2

## 2014-12-21 MED ORDER — TEMAZEPAM 7.5 MG PO CAPS
7.5000 mg | ORAL_CAPSULE | Freq: Every evening | ORAL | Status: DC | PRN
  Administered 2014-12-22 – 2014-12-24 (×3): 7.5 mg via ORAL
  Filled 2014-12-21 (×3): qty 1

## 2014-12-21 NOTE — Progress Notes (Signed)
Disposition CSW completed referrals for patient to the following inpatient psych facilities:  [facility] [place]  CSW will continue to assist with patient's placement needs.  [facility] Disposition CSW [phone]

## 2014-12-21 NOTE — ED Notes (Signed)
Pt sleeping at present, arouseable to verbal stimuli. No distress noted, calm & cooperative, Monitoring for safety, Q 15 min checks in effect at present.

## 2014-12-21 NOTE — Consult Note (Signed)
[facility] Face-to-Face Psychiatry Consult   Reason for Consult:  Psychotic, Delusion Referring Physician: EDP Patient Identification: Christine Cobb MRN:  [id number] Principal Diagnosis: Bipolar affective disorder, current episode manic with psychotic symptoms Diagnosis:   Patient Active Problem List   Diagnosis Date Noted  . Psychoses [F29]   . Bipolar disorder, current episode manic without psychotic features, severe [F31.13]   . Agitation [R45.1]   . Manic behavior [F30.10]   . Psychosis [F29]   . Bipolar affective disorder, current episode manic with psychotic symptoms [F31.2] 03/16/2014  . Hyperthyroidism [E05.90] 09/21/2013  . Marijuana abuse [F12.10] 04/12/2013  . History of CHF (congestive heart failure) [Z86.79] 02/01/2013  . Abscess of abdominal wall [L02.211] 10/18/2012  . HTN (hypertension) [I10] 04/08/2011  . Tachycardia [R00.0] 04/08/2011   Total Time spent with patient: 25 minutes  Subjective:   Christine Cobb is a 39 y.o. female patient admitted with Psychosis and delusions. Pt seen and chart reviewed with Dr. Elsie Saas and Dahlia Byes, NP. Pt continues to present with delusions that she is a Emergency planning/management officer, a Psychologist, sport and exercise, a Engineer, civil (consulting), and a famous person. Pt's reality-testing is severely impaired and she continues to meet inpatient criteria. Denies suicidal/homicidal ideation, although the accuracy of her statements at this time is questionable due to mania.   HPI:  AA female, 39 years old was evaluated for symptoms of Psychosis, yelling, aggressive behavior towards staff and  disorganized  While in jail.  She was placed on 4 point restraint on arrival and Geodon injection was given as she continued to exhibit agitation and disorganized behavior..  Patient,  as soon as she woke up remained  disorganized and could not participate in the interview.  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 Patient cannot stay in her room for full interview and evaluation at this time..  Patient is asking for a discharge to go to work.  She has been accepted for admission and we will be seeking placement at any facility with available inpatient Psychiatric bed.    HPI Elements:   Location:  Bipolar affective disorder, current episode is manic. Quality:  severe, severe delusion of grandeur. Severity:  severe. Timing:  Acute. Duration:  Chronic mental illness. Context:  IVC from jail for agitataion..  Past Medical History:  Past Medical History  Diagnosis Date  . Sinus tachycardia   . HTN (hypertension)   . Arm pain   . Eye globe prosthesis   . CHF (congestive heart failure)   . Bipolar affective disorder, currently manic, mild   . Hyperthyroidism     Past Surgical History  Procedure Laterality Date  . Eye surgery    . Dilation and curettage of uterus     Family History:  Family History  Problem Relation Age of Onset  . Hypertension Other   . Emphysema Other   . Asthma Son   . Diabetes Maternal Uncle   . Diabetes Paternal Grandmother    Social History:  History  Alcohol Use No     History  Drug Use  . Yes  . Special: Marijuana    Comment: over a month since used marijuana  History   Social History  . Marital Status: Married    Spouse Name: N/A  . Number of Children: N/A  . Years of Education: N/A   Social History Main Topics  . Smoking status: Current Every Day Smoker -- 1.00 packs/day    Types: Cigarettes    Last Attempt to Quit: 08/21/2013  . Smokeless tobacco: Never Used  . Alcohol Use: No  . Drug Use: Yes    Special: Marijuana     Comment: over a month since used marijuana  . Sexual Activity: Yes    Birth Control/ Protection: Injection    Other Topics Concern  . None   Social History Narrative   Additional Social History:    Pain Medications: UTA Prescriptions: UTA Over the Counter: UTA History of alcohol / drug use?: Yes Longest period of sobriety (when/how long): UTA Negative Consequences of Use:  (UTA) Withdrawal Symptoms:  (UTA) Name of Substance 1: Alcohol  (per previous assessment) 1 - Age of First Use: 15 1 - Amount (size/oz): "1 beer"  1 - Frequency: unknown  1 - Duration: ongoing  1 - Last Use / Amount: UTA                   Allergies:   Allergies  Allergen Reactions  . Amoxil [Amoxicillin] Anaphylaxis  . Trazodone And Nefazodone Anaphylaxis  . Ketorolac Tromethamine Hives and Swelling  . Prenatal [B-Plex Plus] Nausea Only  . Tegretol [Carbamazepine] Other (See Comments)    Swelling, skin peeling, skin discoloration    Labs:  Results for orders placed or performed during the hospital encounter of 12/19/14 (from the past 48 hour(s))  CBC     Status: Abnormal   Collection Time: 12/21/14  7:00 AM  Result Value Ref Range   WBC 5.4 4.0 - 10.5 K/uL   RBC 4.09 3.87 - 5.11 MIL/uL   Hemoglobin 11.8 (L) 12.0 - 15.0 g/dL   HCT 59.1 (L) 63.8 - 46.6 %   MCV 86.6 78.0 - 100.0 fL   MCH 28.9 26.0 - 34.0 pg   MCHC 33.3 30.0 - 36.0 g/dL   RDW 59.9 35.7 - 01.7 %   Platelets 298 150 - 400 K/uL  Lipid panel, fasting     Status: None   Collection Time: 12/21/14  7:00 AM  Result Value Ref Range   Cholesterol 121 0 - 200 mg/dL   Triglycerides 66 <793 mg/dL   HDL 53 >90 mg/dL   Total CHOL/HDL Ratio 2.3 RATIO   VLDL 13 0 - 40 mg/dL   LDL Cholesterol 55 0 - 99 mg/dL    Comment:        Total Cholesterol/HDL:CHD Risk Coronary Heart Disease Risk Table                     Men   Women  1/2 Average Risk   3.4   3.3  Average Risk       5.0   4.4  2 X Average Risk   9.6   7.1  3 X Average Risk  23.4   11.0        Use the calculated Patient Ratio above and the CHD Risk Table to determine the  patient's CHD Risk.        ATP III CLASSIFICATION (LDL):  <100     mg/dL   Optimal  300-923  mg/dL   Near or Above  Optimal  130-159  mg/dL   Borderline  284-132  mg/dL   High  >440     mg/dL   Very High Performed at [facility]   TSH     Status: Abnormal   Collection Time: 12/21/14  7:00 AM  Result Value Ref Range   TSH 0.081 (L) 0.350 - 4.500 uIU/mL  Acetaminophen level     Status: Abnormal   Collection Time: 12/21/14  7:00 AM  Result Value Ref Range   Acetaminophen (Tylenol), Serum <10 (L) 10 - 30 ug/mL    Comment:        THERAPEUTIC CONCENTRATIONS VARY SIGNIFICANTLY. A RANGE OF 10-30 ug/mL MAY BE AN EFFECTIVE CONCENTRATION FOR MANY PATIENTS. HOWEVER, SOME ARE BEST TREATED AT CONCENTRATIONS OUTSIDE THIS RANGE. ACETAMINOPHEN CONCENTRATIONS >150 ug/mL AT 4 HOURS AFTER INGESTION AND >50 ug/mL AT 12 HOURS AFTER INGESTION ARE OFTEN ASSOCIATED WITH TOXIC REACTIONS.   Salicylate level     Status: None   Collection Time: 12/21/14  7:00 AM  Result Value Ref Range   Salicylate Lvl <4.0 2.8 - 30.0 mg/dL  Urinalysis, Routine w reflex microscopic (not at [facility])     Status: Abnormal   Collection Time: 12/21/14  8:40 AM  Result Value Ref Range   Color, Urine YELLOW YELLOW   APPearance TURBID (A) CLEAR   Specific Gravity, Urine 1.008 1.005 - 1.030   pH 6.0 5.0 - 8.0   Glucose, UA NEGATIVE NEGATIVE mg/dL   Hgb urine dipstick LARGE (A) NEGATIVE   Bilirubin Urine NEGATIVE NEGATIVE   Ketones, ur NEGATIVE NEGATIVE mg/dL   Protein, ur NEGATIVE NEGATIVE mg/dL   Urobilinogen, UA 0.2 0.0 - 1.0 mg/dL   Nitrite NEGATIVE NEGATIVE   Leukocytes, UA NEGATIVE NEGATIVE  Urine rapid drug screen (hosp performed)not at [facility]     Status: Abnormal   Collection Time: 12/21/14  8:40 AM  Result Value Ref Range   Opiates NONE DETECTED NONE DETECTED   Cocaine NONE DETECTED NONE DETECTED   Benzodiazepines POSITIVE (A) NONE DETECTED   Amphetamines NONE DETECTED NONE DETECTED    Tetrahydrocannabinol NONE DETECTED NONE DETECTED   Barbiturates NONE DETECTED NONE DETECTED    Comment:        DRUG SCREEN FOR MEDICAL PURPOSES ONLY.  IF CONFIRMATION IS NEEDED FOR ANY PURPOSE, NOTIFY LAB WITHIN 5 DAYS.        LOWEST DETECTABLE LIMITS FOR URINE DRUG SCREEN Drug Class       Cutoff (ng/mL) Amphetamine      1000 Barbiturate      200 Benzodiazepine   200 Tricyclics       300 Opiates          300 Cocaine          300 THC              50   Urine microscopic-add on     Status: Abnormal   Collection Time: 12/21/14  8:40 AM  Result Value Ref Range   Squamous Epithelial / LPF FEW (A) RARE   WBC, UA 7-10 <3 WBC/hpf   RBC / HPF 0-2 <3 RBC/hpf    Vitals: Blood pressure 90/59, pulse 117, temperature 98.9 F (37.2 C), temperature source Oral, resp. rate 16, SpO2 100 %, not currently breastfeeding.  Risk to Self: Suicidal Ideation: No Suicidal Intent: No Is patient at risk for suicide?: Yes Suicidal Plan?: No Access to Means:  (UTA) What has been your use of drugs/alcohol within the last 12 months?:  (UTA)  Other Self Harm Risks:  (UTA) Triggers for Past Attempts:  (UTA) Intentional Self Injurious Behavior:  (UTA) Risk to Others: Homicidal Ideation: No Thoughts of Harm to Others: No Current Homicidal Intent: No Current Homicidal Plan: No Access to Homicidal Means: No History of harm to others?:  (UTA) Assessment of Violence: On admission Violent Behavior Description:  (in jail, in ED) Does patient have access to weapons?:  (UTA) Criminal Charges Pending?:  (UTA) Does patient have a court date:  (UTA) Prior Inpatient Therapy: Prior Inpatient Therapy: Yes Prior Therapy Dates: Dates unknown  Prior Therapy Facilty/Provider(s): [place]  Reason for Treatment: Psychosis  Prior Outpatient Therapy: Prior Outpatient Therapy:  (UTA) Does patient have an ACCT team?: Unknown Does patient have Intensive In-House Services?  : No Does patient have [facility] services? :  Unknown Does patient have P4CC services?: No  Current Facility-Administered Medications  Medication Dose Route Frequency Provider Last Rate Last Dose  . amantadine (SYMMETREL) capsule 100 mg  100 mg Oral BID [name], NP   100 mg at 12/21/14 0919  . hydrOXYzine (ATARAX/VISTARIL) tablet 50 mg  50 mg Oral Once Marisa Severin, MD   50 mg at 12/21/14 0302  . ibuprofen (ADVIL,MOTRIN) tablet 800 mg  800 mg Oral BID PRN Donnetta Hutching, MD   800 mg at 12/21/14 0237  . LORazepam (ATIVAN) tablet 1 mg  1 mg Oral Q8H PRN [name], NP   1 mg at 12/21/14 1635  . nicotine (NICODERM CQ - dosed in mg/24 hours) patch 21 mg  21 mg Transdermal Once [name], NP      . nicotine polacrilex (NICORETTE) gum 2 mg  2 mg Oral PRN Donnetta Hutching, MD   2 mg at 12/21/14 1635  . white petrolatum (VASELINE) gel   Topical PRN Donnetta Hutching, MD      . ziprasidone (GEODON) capsule 40 mg  40 mg Oral BID WC [name], NP   40 mg at 12/21/14 1635  . zolpidem (AMBIEN) tablet 5 mg  5 mg Oral QHS PRN [name], NP   5 mg at 12/20/14 2136   Current Outpatient Prescriptions  Medication Sig Dispense Refill  . ALPRAZolam (XANAX) 1 MG tablet Take 1 tablet (1 mg total) by mouth at bedtime as needed for anxiety. (Patient not taking: Reported on 07/05/2014) 10 tablet 0  . furosemide (LASIX) 20 MG tablet Take 1 tablet (20 mg total) by mouth daily. (Patient not taking: Reported on 06/01/2014) 10 tablet 0  . medroxyPROGESTERone (PROVERA) 10 MG tablet Take 1 tablet (10 mg total) by mouth daily. (Patient not taking: Reported on 06/01/2014) 30 tablet 0    Musculoskeletal: Strength & Muscle Tone: within normal limits Gait & Station: normal Patient leans: N/A  Psychiatric Specialty Exam: Physical Exam  Review of Systems  Unable to perform ROS: mental acuity  Constitutional: Negative.   HENT: Negative.   Eyes: Negative.   Cardiovascular: Negative.   Gastrointestinal: Negative.   Genitourinary: Negative.    Musculoskeletal: Negative.   Skin: Negative.   Neurological: Negative.   Endo/Heme/Allergies: Negative.   Psychiatric/Behavioral: Positive for hallucinations (mania, delusions). The patient is nervous/anxious and has insomnia.   All other systems reviewed and are negative.   Blood pressure 90/59, pulse 117, temperature 98.9 F (37.2 C), temperature source Oral, resp. rate 16, SpO2 100 %, not currently breastfeeding.There is no weight on file to calculate BMI.  General Appearance: Casual  Eye Contact::  Fair  Speech:  Pressured  Volume:  Increased  Mood:  Anxious, Euphoric and Irritable  Affect:  Congruent and Labile  Thought Process:  Disorganized  Orientation:  Other:  oriented to self only  Thought Content:  Delusions and Hallucinations: Auditory Visual  Suicidal Thoughts:  unable to obtain  Homicidal Thoughts:  unable to obtain  Memory:  Immediate;   Poor Recent;   Poor Remote;   Poor  Judgement:  Poor  Insight:  Lacking  Psychomotor Activity:  Restlessness  Concentration:  Poor  Recall:  NA  Fund of Knowledge:Poor  Language: Poor  Akathisia:  NA  Handed:  Right  AIMS (if indicated):     Assets:  Desire for Improvement  ADL's:  Intact  Cognition: Impaired,  Severe  Sleep:      Medical Decision Making: Review of Psycho-Social Stressors (1), Decision to obtain old records (1), Established Problem, Worsening (2), Review of Medication Regimen & Side Effects (2) and Review of New Medication or Change in Dosage (2)  Treatment Plan Summary: Bipolar affective disorder, current episode manic with psychotic symptoms, unstable, warrants inpatient admission; managed as follows:   Daily contact with patient to assess and evaluate symptoms and progress in treatment and Medication management  Plan:   -Continue Ativan 1mg  PO q8h PRN agitation -Continue Geodon 40mg  bid wc for psychosis.  -Discontinue Ambien (delusional) -Start Restoril 7.5mg  qhs PRN insomnia  Disposition:   -Inpatient psychiatric hospitalization for safety and stabilization  Beau Fanny, FNP-BC  12/21/2014 5:02 PM  Patient seen face-to-face for this evaluation and case discussed with physician extender and made appropriate treatment plan. Reviewed the information documented and agree with the treatment plan.  Jakwon Gayton,JANARDHAHA R. 12/22/2014 2:05 PM

## 2014-12-21 NOTE — ED Notes (Signed)
Patient c/o back pain and insomnia. Requested IM medications. Has agreed to try PO at present.

## 2014-12-22 DIAGNOSIS — F312 Bipolar disorder, current episode manic severe with psychotic features: Secondary | ICD-10-CM | POA: Diagnosis not present

## 2014-12-22 MED ORDER — ZIPRASIDONE HCL 20 MG PO CAPS
60.0000 mg | ORAL_CAPSULE | Freq: Two times a day (BID) | ORAL | Status: DC
  Administered 2014-12-22 – 2014-12-25 (×6): 60 mg via ORAL
  Filled 2014-12-22 (×7): qty 3

## 2014-12-22 MED ORDER — TEMAZEPAM 7.5 MG PO CAPS
7.5000 mg | ORAL_CAPSULE | Freq: Once | ORAL | Status: AC
  Administered 2014-12-22: 7.5 mg via ORAL
  Filled 2014-12-22: qty 1

## 2014-12-22 NOTE — Consult Note (Signed)
[facility] Face-to-Face Psychiatry Consult   Reason for Consult:  Psychotic, Delusion Referring Physician: EDP Patient Identification: Christine Cobb MRN:  [id number] Principal Diagnosis: Bipolar affective disorder, current episode manic with psychotic symptoms Diagnosis:   Patient Active Problem List   Diagnosis Date Noted  . Psychoses [F29]   . Bipolar disorder, current episode manic without psychotic features, severe [F31.13]   . Agitation [R45.1]   . Manic behavior [F30.10]   . Psychosis [F29]   . Bipolar affective disorder, current episode manic with psychotic symptoms [F31.2] 03/16/2014  . Hyperthyroidism [E05.90] 09/21/2013  . Marijuana abuse [F12.10] 04/12/2013  . History of CHF (congestive heart failure) [Z86.79] 02/01/2013  . Abscess of abdominal wall [L02.211] 10/18/2012  . HTN (hypertension) [I10] 04/08/2011  . Tachycardia [R00.0] 04/08/2011   Total Time spent with patient: 25 minutes  Subjective:   Christine Cobb is a 39 y.o. female admitted with bipolar disorder manic episode with poor insight and judgment. Patient seen face-to-face for this evaluation in case discussed with treatment team and [place] O[place] has been compliant with her medication but continued to have severe manic symptoms the disorganized thoughts, delusional and grandiosity. 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 patient'slity-testing is severely impaired and she continues to meet inpatient criteria.  patient minimizes her safety concerns including suicidal/homicidal ideation, although the accuracy of her statements at this time is questionable due to mania.   HPI:  AA female, 39 years old was evaluated for symptoms of Psychosis, yelling, aggressive behavior towards staff and  disorganized  While in jail.  She was placed on 4 point restraint on arrival and  Geodon injection was given as she continued to exhibit agitation and disorganized behavior..  Patient,  as soon as she woke up remained  disorganized and could not participate in the interview.  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 Patient cannot stay in her room for full interview and evaluation at this time..  Patient is asking for a discharge to go to work.  She has been accepted for admission and we will be seeking placement at any facility with available inpatient Psychiatric bed.    HPI Elements:  Location:  Bipolar affective disorder, current episode is manic. Quality:  severe, severe delusion of grandeur. Severity:  severe. Timing:  Acute. Duration:  Chronic mental illness. Context:  IVC from jail for agitataion..  Past Medical History:  Past Medical History  Diagnosis Date  . Sinus tachycardia   . HTN (hypertension)   . Arm pain   . Eye globe prosthesis   . CHF (congestive heart failure)   . Bipolar affective disorder, currently manic, mild   . Hyperthyroidism     Past Surgical History  Procedure Laterality Date  . Eye surgery    . Dilation and curettage of uterus     Family History:  Family History  Problem Relation Age of Onset  . Hypertension Other   . Emphysema Other   . Asthma Son   . Diabetes Maternal Uncle   .  Diabetes Paternal Grandmother    Social History:  History  Alcohol Use No     History  Drug Use  . Yes  . Special: Marijuana    Comment: over a month since used marijuana    History   Social History  . Marital Status: Married    Spouse Name: N/A  . Number of Children: N/A  . Years of Education: N/A   Social History Main Topics  . Smoking status: Current  Every Day Smoker -- 1.00 packs/day    Types: Cigarettes    Last Attempt to Quit: 08/21/2013  . Smokeless tobacco: Never Used  . Alcohol Use: No  . Drug Use: Yes    Special: Marijuana     Comment: over a month since used marijuana  . Sexual Activity: Yes    Birth Control/ Protection: Injection   Other Topics Concern  . None   Social History Narrative   Additional Social History:    Pain Medications: UTA Prescriptions: UTA Over the Counter: UTA History of alcohol / drug use?: Yes Longest period of sobriety (when/how long): UTA Negative Consequences of Use:  (UTA) Withdrawal Symptoms:  (UTA) Name of Substance 1: Alcohol  (per previous assessment) 1 - Age of First Use: 15 1 - Amount (size/oz): "1 beer"  1 - Frequency: unknown  1 - Duration: ongoing  1 - Last Use / Amount: UTA                   Allergies:   Allergies  Allergen Reactions  . Amoxil [Amoxicillin] Anaphylaxis  . Trazodone And Nefazodone Anaphylaxis  . Ketorolac Tromethamine Hives and Swelling  . Prenatal [B-Plex Plus] Nausea Only  . Tegretol [Carbamazepine] Other (See Comments)    Swelling, skin peeling, skin discoloration    Labs:  Results for orders placed or performed during the hospital encounter of 12/19/14 (from the past 48 hour(s))  CBC     Status: Abnormal   Collection Time: 12/21/14  7:00 AM  Result Value Ref Range   WBC 5.4 4.0 - 10.5 K/uL   RBC 4.09 3.87 - 5.11 MIL/uL   Hemoglobin 11.8 (L) 12.0 - 15.0 g/dL   HCT 16.1 (L) 09.6 - 04.5 %   MCV 86.6 78.0 - 100.0 fL   MCH 28.9 26.0 - 34.0 pg   MCHC 33.3 30.0 - 36.0 g/dL   RDW 40.9 81.1 - 91.4 %   Platelets 298 150 - 400 K/uL  Lipid panel, fasting     Status: None   Collection Time: 12/21/14  7:00 AM  Result Value Ref Range   Cholesterol 121 0 - 200 mg/dL   Triglycerides 66 <782 mg/dL   HDL 53 >95 mg/dL   Total CHOL/HDL Ratio 2.3 RATIO   VLDL 13 0 - 40 mg/dL   LDL Cholesterol 55 0 - 99 mg/dL    Comment:        Total  Cholesterol/HDL:CHD Risk Coronary Heart Disease Risk Table                     Men   Women  1/2 Average Risk   3.4   3.3  Average Risk       5.0   4.4  2 X Average Risk   9.6   7.1  3 X Average Risk  23.4   11.0        Use the calculated Patient Ratio above and the CHD Risk Table to determine the patient's CHD Risk.  ATP III CLASSIFICATION (LDL):  <100     mg/dL   Optimal  960-454  mg/dL   Near or Above                    Optimal  130-159  mg/dL   Borderline  098-119  mg/dL   High  >147     mg/dL   Very High Performed at [facility]   TSH     Status: Abnormal   Collection Time: 12/21/14  7:00 AM  Result Value Ref Range   TSH 0.081 (L) 0.350 - 4.500 uIU/mL  Acetaminophen level     Status: Abnormal   Collection Time: 12/21/14  7:00 AM  Result Value Ref Range   Acetaminophen (Tylenol), Serum <10 (L) 10 - 30 ug/mL    Comment:        THERAPEUTIC CONCENTRATIONS VARY SIGNIFICANTLY. A RANGE OF 10-30 ug/mL MAY BE AN EFFECTIVE CONCENTRATION FOR MANY PATIENTS. HOWEVER, SOME ARE BEST TREATED AT CONCENTRATIONS OUTSIDE THIS RANGE. ACETAMINOPHEN CONCENTRATIONS >150 ug/mL AT 4 HOURS AFTER INGESTION AND >50 ug/mL AT 12 HOURS AFTER INGESTION ARE OFTEN ASSOCIATED WITH TOXIC REACTIONS.   Salicylate level     Status: None   Collection Time: 12/21/14  7:00 AM  Result Value Ref Range   Salicylate Lvl <4.0 2.8 - 30.0 mg/dL  Urinalysis, Routine w reflex microscopic (not at [facility])     Status: Abnormal   Collection Time: 12/21/14  8:40 AM  Result Value Ref Range   Color, Urine YELLOW YELLOW   APPearance TURBID (A) CLEAR   Specific Gravity, Urine 1.008 1.005 - 1.030   pH 6.0 5.0 - 8.0   Glucose, UA NEGATIVE NEGATIVE mg/dL   Hgb urine dipstick LARGE (A) NEGATIVE   Bilirubin Urine NEGATIVE NEGATIVE   Ketones, ur NEGATIVE NEGATIVE mg/dL   Protein, ur NEGATIVE NEGATIVE mg/dL   Urobilinogen, UA 0.2 0.0 - 1.0 mg/dL   Nitrite NEGATIVE NEGATIVE   Leukocytes, UA NEGATIVE  NEGATIVE  Urine rapid drug screen (hosp performed)not at [facility]     Status: Abnormal   Collection Time: 12/21/14  8:40 AM  Result Value Ref Range   Opiates NONE DETECTED NONE DETECTED   Cocaine NONE DETECTED NONE DETECTED   Benzodiazepines POSITIVE (A) NONE DETECTED   Amphetamines NONE DETECTED NONE DETECTED   Tetrahydrocannabinol NONE DETECTED NONE DETECTED   Barbiturates NONE DETECTED NONE DETECTED    Comment:        DRUG SCREEN FOR MEDICAL PURPOSES ONLY.  IF CONFIRMATION IS NEEDED FOR ANY PURPOSE, NOTIFY LAB WITHIN 5 DAYS.        LOWEST DETECTABLE LIMITS FOR URINE DRUG SCREEN Drug Class       Cutoff (ng/mL) Amphetamine      1000 Barbiturate      200 Benzodiazepine   200 Tricyclics       300 Opiates          300 Cocaine          300 THC              50   Urine microscopic-add on     Status: Abnormal   Collection Time: 12/21/14  8:40 AM  Result Value Ref Range   Squamous Epithelial / LPF FEW (A) RARE   WBC, UA 7-10 <3 WBC/hpf   RBC / HPF 0-2 <3 RBC/hpf    Vitals: Blood pressure 117/82, pulse 78, temperature 97.7 F (36.5 C), temperature source Oral, resp. rate 18, SpO2 100 %,  not currently breastfeeding.  Risk to Self: Suicidal Ideation: No Suicidal Intent: No Is patient at risk for suicide?: Yes Suicidal Plan?: No Access to Means:  (UTA) What has been your use of drugs/alcohol within the last 12 months?:  (UTA) Other Self Harm Risks:  (UTA) Triggers for Past Attempts:  (UTA) Intentional Self Injurious Behavior:  (UTA) Risk to Others: Homicidal Ideation: No Thoughts of Harm to Others: No Current Homicidal Intent: No Current Homicidal Plan: No Access to Homicidal Means: No History of harm to others?:  (UTA) Assessment of Violence: On admission Violent Behavior Description:  (in jail, in ED) Does patient have access to weapons?:  (UTA) Criminal Charges Pending?:  (UTA) Does patient have a court date:  (UTA) Prior Inpatient Therapy: Prior Inpatient Therapy:  Yes Prior Therapy Dates: Dates unknown  Prior Therapy Facilty/Provider(s): [place]  Reason for Treatment: Psychosis  Prior Outpatient Therapy: Prior Outpatient Therapy:  (UTA) Does patient have an ACCT team?: Unknown Does patient have Intensive In-House Services?  : No Does patient have [facility] services? : Unknown Does patient have P4CC services?: No  Current Facility-Administered Medications  Medication Dose Route Frequency Provider Last Rate Last Dose  . amantadine (SYMMETREL) capsule 100 mg  100 mg Oral BID [name], NP   100 mg at 12/22/14 0909  . hydrOXYzine (ATARAX/VISTARIL) tablet 50 mg  50 mg Oral Once Marisa Severin, MD   50 mg at 12/21/14 0302  . ibuprofen (ADVIL,MOTRIN) tablet 800 mg  800 mg Oral BID PRN Donnetta Hutching, MD   800 mg at 12/22/14 0847  . LORazepam (ATIVAN) tablet 1 mg  1 mg Oral Q8H PRN [name], NP   1 mg at 12/22/14 0910  . nicotine (NICODERM CQ - dosed in mg/24 hours) patch 21 mg  21 mg Transdermal Once [name], NP      . nicotine polacrilex (NICORETTE) gum 2 mg  2 mg Oral PRN Donnetta Hutching, MD   2 mg at 12/22/14 1412  . temazepam (RESTORIL) capsule 7.5 mg  7.5 mg Oral QHS PRN Beau Fanny, FNP      . white petrolatum (VASELINE) gel   Topical PRN Donnetta Hutching, MD      . ziprasidone (GEODON) capsule 40 mg  40 mg Oral BID WC [name], NP   40 mg at 12/22/14 4098   Current Outpatient Prescriptions  Medication Sig Dispense Refill  . ALPRAZolam (XANAX) 1 MG tablet Take 1 tablet (1 mg total) by mouth at bedtime as needed for anxiety. (Patient not taking: Reported on 07/05/2014) 10 tablet 0  . furosemide (LASIX) 20 MG tablet Take 1 tablet (20 mg total) by mouth daily. (Patient not taking: Reported on 06/01/2014) 10 tablet 0  . medroxyPROGESTERone (PROVERA) 10 MG tablet Take 1 tablet (10 mg total) by mouth daily. (Patient not taking: Reported on 06/01/2014) 30 tablet 0    Musculoskeletal: Strength & Muscle Tone: within normal limits Gait &  Station: normal Patient leans: N/A  Psychiatric Specialty Exam: Physical Exam  Review of Systems  Unable to perform ROS: mental acuity  Constitutional: Negative.   HENT: Negative.   Eyes: Negative.   Cardiovascular: Negative.   Gastrointestinal: Negative.   Genitourinary: Negative.   Musculoskeletal: Negative.   Skin: Negative.   Neurological: Negative.   Endo/Heme/Allergies: Negative.   Psychiatric/Behavioral: Positive for hallucinations (mania, delusions). The patient is nervous/anxious and has insomnia.   All other systems reviewed and are negative.   Blood pressure 117/82, pulse 78,  temperature 97.7 F (36.5 C), temperature source Oral, resp. rate 18, SpO2 100 %, not currently breastfeeding.There is no weight on file to calculate BMI.  General Appearance: Casual  Eye Contact::  Fair  Speech:  Pressured  Volume:  Increased  Mood:  Anxious, Euphoric and Irritable  Affect:  Congruent and Labile  Thought Process:  Disorganized  Orientation:  Other:  oriented to self only  Thought Content:  Delusions and Hallucinations: Auditory Visual  Suicidal Thoughts:  unable to obtain  Homicidal Thoughts:  unable to obtain  Memory:  Immediate;   Poor Recent;   Poor Remote;   Poor  Judgement:  Poor  Insight:  Lacking  Psychomotor Activity:  Restlessness  Concentration:  Poor  Recall:  NA  Fund of Knowledge:Poor  Language: Poor  Akathisia:  NA  Handed:  Right  AIMS (if indicated):     Assets:  Desire for Improvement  ADL's:  Intact  Cognition: Impaired,  Severe  Sleep:      Medical Decision Making: Review of Psycho-Social Stressors (1), Decision to obtain old records (1), Established Problem, Worsening (2), Review of Medication Regimen & Side Effects (2) and Review of New Medication or Change in Dosage (2)  Treatment Plan Summary: Bipolar affective disorder, current episode manic with psychotic symptoms, unstable, warrants inpatient admission; managed as follows:   Daily  contact with patient to assess and evaluate symptoms and progress in treatment and Medication management  Plan:   -Continue Ativan 1mg  PO q8h PRN agitation -increase  Geodon 60 mg bid wc for psychosis.  -Continue Restoril 7.5mg  qhs PRN insomnia  Disposition:  -Inpatient psychiatric hospitalization for safety and stabilization and medication management.    Rishawn Walck,JANARDHAHA R. 12/22/2014 2:41 PM

## 2014-12-22 NOTE — ED Notes (Signed)
Pt awake, alert, irritable and anxious. Pt continues to present with delusions that she is a Emergency planning/management officer, a Psychologist, sport and exercise, a Engineer, civil (consulting), and a famous person. Denies SI/HI. No aggressive/ assaultive behavior noted at this time. Will continue to monitor closely and evaluate for stabilization.

## 2014-12-22 NOTE — ED Notes (Signed)
Pt sleeping at present, no distress noted, calm & cooperative, monitoring for safety, Q 15 min checks in effect. 

## 2014-12-23 DIAGNOSIS — F312 Bipolar disorder, current episode manic severe with psychotic features: Secondary | ICD-10-CM | POA: Diagnosis not present

## 2014-12-23 LAB — HEMOGLOBIN A1C
HEMOGLOBIN A1C: 5.4 % (ref 4.8–5.6)
Mean Plasma Glucose: 108 mg/dL

## 2014-12-23 MED ORDER — OLANZAPINE 10 MG PO TBDP
10.0000 mg | ORAL_TABLET | Freq: Three times a day (TID) | ORAL | Status: DC | PRN
  Administered 2014-12-23 – 2014-12-24 (×2): 10 mg via ORAL
  Filled 2014-12-23 (×2): qty 1

## 2014-12-23 NOTE — ED Notes (Signed)
Pt asleep; did not awake for VS per RN

## 2014-12-23 NOTE — Consult Note (Signed)
[facility] Face-to-Face Psychiatry Consult   Reason for Consult:  Psychotic, Delusion Referring Physician: EDP Patient Identification: Christine Cobb MRN:  [id number] Principal Diagnosis: Bipolar affective disorder, current episode manic with psychotic symptoms Diagnosis:   Patient Active Problem List   Diagnosis Date Noted  . Bipolar affective disorder, current episode manic with psychotic symptoms [F31.2] 03/16/2014    Priority: High  . Psychoses [F29]   . Bipolar disorder, current episode manic without psychotic features, severe [F31.13]   . Agitation [R45.1]   . Manic behavior [F30.10]   . Psychosis [F29]   . Hyperthyroidism [E05.90] 09/21/2013  . Marijuana abuse [F12.10] 04/12/2013  . History of CHF (congestive heart failure) [Z86.79] 02/01/2013  . Abscess of abdominal wall [L02.211] 10/18/2012  . HTN (hypertension) [I10] 04/08/2011  . Tachycardia [R00.0] 04/08/2011   Total Time spent with patient: 30 minutes  Subjective:   Christine Cobb is a 39 y.o. 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  She reports sleep and appetite are "good".    HPI:  AA female, 39 years old was evaluated for symptoms of Psychosis, yelling, aggressive behavior towards staff and  disorganized  While in jail.  She was placed on 4 point restraint on arrival and Geodon injection was given as she continued to exhibit agitation and disorganized behavior..  Patient,  as soon as she woke up remained  disorganized and could not participate in the interview.  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 Patient  cannot stay in her room for full interview and evaluation at this time..  Patient is asking for a discharge to go to work.  She has been accepted for admission and we will be seeking placement at any facility with available inpatient Psychiatric bed.    HPI Elements:  Location:  Bipolar affective disorder, current episode is manic. Quality:  severe, severe delusion of grandeur. Severity:  severe. Timing:  Acute. Duration:  Chronic mental illness. Context:  IVC from jail for agitataion..  Past Medical History:  Past Medical History  Diagnosis Date  . Sinus tachycardia   . HTN (hypertension)   . Arm pain   . Eye globe prosthesis   . CHF (congestive heart failure)   . Bipolar affective disorder, currently manic, mild   . Hyperthyroidism     Past Surgical History  Procedure Laterality Date  . Eye surgery    . Dilation and curettage of uterus     Family History:  Family History  Problem Relation Age of Onset  . Hypertension Other   . Emphysema Other   . Asthma Son   . Diabetes Maternal Uncle   . Diabetes Paternal Grandmother    Social History:  History  Alcohol Use No     History  Drug Use  . Yes  . Special: Marijuana    Comment: over a month since used marijuana    History   Social History  . Marital Status: Married    Spouse Name: N/A  . Number of Children: N/A  . Years of Education: N/A  Social History Main Topics  . Smoking status: Current Every Day Smoker -- 1.00 packs/day    Types: Cigarettes    Last Attempt to Quit: 08/21/2013  . Smokeless tobacco: Never Used  . Alcohol Use: No  . Drug Use: Yes    Special: Marijuana     Comment: over a month since used marijuana  . Sexual Activity: Yes    Birth Control/ Protection: Injection   Other Topics Concern  . None   Social History Narrative   Additional Social History:    Pain Medications: UTA Prescriptions: UTA Over the Counter: UTA History of alcohol / drug use?: Yes Longest period of sobriety  (when/how long): UTA Negative Consequences of Use:  (UTA) Withdrawal Symptoms:  (UTA) Name of Substance 1: Alcohol  (per previous assessment) 1 - Age of First Use: 15 1 - Amount (size/oz): "1 beer"  1 - Frequency: unknown  1 - Duration: ongoing  1 - Last Use / Amount: UTA                   Allergies:   Allergies  Allergen Reactions  . Amoxil [Amoxicillin] Anaphylaxis  . Trazodone And Nefazodone Anaphylaxis  . Ketorolac Tromethamine Hives and Swelling  . Prenatal [B-Plex Plus] Nausea Only  . Tegretol [Carbamazepine] Other (See Comments)    Swelling, skin peeling, skin discoloration    Labs:  No results found for this or any previous visit (from the past 48 hour(s)).  Vitals: Blood pressure 118/79, pulse 107, temperature 98.4 F (36.9 C), temperature source Oral, resp. rate 16, SpO2 100 %, not currently breastfeeding.  Risk to Self: Suicidal Ideation: No Suicidal Intent: No Is patient at risk for suicide?: Yes Suicidal Plan?: No Access to Means:  (UTA) What has been your use of drugs/alcohol within the last 12 months?:  (UTA) Other Self Harm Risks:  (UTA) Triggers for Past Attempts:  (UTA) Intentional Self Injurious Behavior:  (UTA) Risk to Others: Homicidal Ideation: No Thoughts of Harm to Others: No Current Homicidal Intent: No Current Homicidal Plan: No Access to Homicidal Means: No History of harm to others?:  (UTA) Assessment of Violence: On admission Violent Behavior Description:  (in jail, in ED) Does patient have access to weapons?:  (UTA) Criminal Charges Pending?:  (UTA) Does patient have a court date:  (UTA) Prior Inpatient Therapy: Prior Inpatient Therapy: Yes Prior Therapy Dates: Dates unknown  Prior Therapy Facilty/Provider(s): [place]  Reason for Treatment: Psychosis  Prior Outpatient Therapy: Prior Outpatient Therapy:  (UTA) Does patient have an ACCT team?: Unknown Does patient have Intensive In-House Services?  : No Does patient have  [facility] services? : Unknown Does patient have P4CC services?: No  Current Facility-Administered Medications  Medication Dose Route Frequency Provider Last Rate Last Dose  . amantadine (SYMMETREL) capsule 100 mg  100 mg Oral BID [name], NP   100 mg at 12/23/14 0820  . ibuprofen (ADVIL,MOTRIN) tablet 800 mg  800 mg Oral BID PRN Donnetta Hutching, MD   800 mg at 12/23/14 1605  . LORazepam (ATIVAN) tablet 1 mg  1 mg Oral Q8H PRN [name], NP   1 mg at 12/22/14 2038  . nicotine (NICODERM CQ - dosed in mg/24 hours) patch 21 mg  21 mg Transdermal Once [name], NP   21 mg at 12/23/14 0716  . nicotine polacrilex (NICORETTE) gum 2 mg  2 mg Oral PRN Donnetta Hutching, MD   2 mg at 12/23/14 1608  . OLANZapine zydis (ZYPREXA)  disintegrating tablet 10 mg  10 mg Oral Q8H PRN Raileigh Sabater      . temazepam (RESTORIL) capsule 7.5 mg  7.5 mg Oral QHS PRN Beau Fanny, FNP   7.5 mg at 12/22/14 2038  . white petrolatum (VASELINE) gel   Topical PRN Donnetta Hutching, MD      . ziprasidone (GEODON) capsule 60 mg  60 mg Oral BID WC Leata Mouse, MD   60 mg at 12/23/14 1605   Current Outpatient Prescriptions  Medication Sig Dispense Refill  . ALPRAZolam (XANAX) 1 MG tablet Take 1 tablet (1 mg total) by mouth at bedtime as needed for anxiety. (Patient not taking: Reported on 07/05/2014) 10 tablet 0  . furosemide (LASIX) 20 MG tablet Take 1 tablet (20 mg total) by mouth daily. (Patient not taking: Reported on 06/01/2014) 10 tablet 0  . medroxyPROGESTERone (PROVERA) 10 MG tablet Take 1 tablet (10 mg total) by mouth daily. (Patient not taking: Reported on 06/01/2014) 30 tablet 0    Musculoskeletal: Strength & Muscle Tone: within normal limits Gait & Station: normal Patient leans: N/A  Psychiatric Specialty Exam: Physical Exam  Review of Systems  Constitutional: Negative.   HENT: Negative.   Eyes: Negative.   Cardiovascular: Negative.   Gastrointestinal: Negative.   Genitourinary:  Negative.   Musculoskeletal: Negative.   Skin: Negative.   Neurological: Negative.   Endo/Heme/Allergies: Negative.   Psychiatric/Behavioral: Positive for hallucinations (mania, delusions). The patient is nervous/anxious and has insomnia.   All other systems reviewed and are negative.   Blood pressure 118/79, pulse 107, temperature 98.4 F (36.9 C), temperature source Oral, resp. rate 16, SpO2 100 %, not currently breastfeeding.There is no weight on file to calculate BMI.  General Appearance: Casual  Eye Contact::  Fair  Speech:  Normal  Volume: Normal  Mood:  Labile  Affect:  Congruent and Labile  Thought Process:  Disorganized  Orientation:  Other:  oriented to self only  Thought Content:  Delusions and Hallucinations: Auditory Visual  Suicidal Thoughts:  No  Homicidal Thoughts:  No  Memory:  Immediate;   Poor Recent;   Poor Remote;   Poor  Judgement:  Poor  Insight:  Lacking  Psychomotor Activity:  Normal  Concentration:  Poor  Recall:  NA  Fund of Knowledge:Poor  Language: Poor  Akathisia:  NA  Handed:  Right  AIMS (if indicated):     Assets:  Desire for Improvement  ADL's:  Intact  Cognition: Impaired,  Severe  Sleep:      Medical Decision Making: Review of Psycho-Social Stressors (1), Decision to obtain old records (1), Established Problem, Worsening (2), Review of Medication Regimen & Side Effects (2) and Review of New Medication or Change in Dosage (2)  Treatment Plan Summary: Bipolar affective disorder, current episode manic with psychotic symptoms, unstable, warrants inpatient admission; managed as follows:   Daily contact with patient to assess and evaluate symptoms and progress in treatment and Medication management  Plan:   -Continue Ativan  PO q8h PRN agitation -Continue  Geodon 60 mg bid wc for psychosis.  -Continue Restoril 7.5mg  qhs PRN insomnia  Disposition:  -Inpatient psychiatric hospitalization for safety and stabilization and medication  management.    Nanine Means, PMH-NP 12/23/2014 5:01 PM  Patient seen face-to-face for psychiatric evaluation, chart reviewed and case discussed with the physician extender and developed treatment plan. Reviewed the information documented and agree with the treatment plan. Thedore Mins, MD

## 2014-12-23 NOTE — ED Notes (Signed)
Pt AAO x 3, no distress noted, cooperative but anxious, constantly asking for meds.  Monitoring for safety, Q 15 min checks in effect.

## 2014-12-24 DIAGNOSIS — F312 Bipolar disorder, current episode manic severe with psychotic features: Secondary | ICD-10-CM | POA: Diagnosis not present

## 2014-12-24 NOTE — Consult Note (Signed)
[facility] Face-to-Face Psychiatry Consult   Reason for Consult:  Psychotic, Delusion Referring Physician: EDP Patient Identification: Christine Cobb MRN:  [id number] Principal Diagnosis: Bipolar affective disorder, current episode manic with psychotic symptoms Diagnosis:   Patient Active Problem List   Diagnosis Date Noted  . Bipolar affective disorder, current episode manic with psychotic symptoms [F31.2] 03/16/2014    Priority: High  . Psychoses [F29]   . Bipolar disorder, current episode manic without psychotic features, severe [F31.13]   . Agitation [R45.1]   . Manic behavior [F30.10]   . Psychosis [F29]   . Hyperthyroidism [E05.90] 09/21/2013  . Marijuana abuse [F12.10] 04/12/2013  . History of CHF (congestive heart failure) [Z86.79] 02/01/2013  . Abscess of abdominal wall [L02.211] 10/18/2012  . HTN (hypertension) [I10] 04/08/2011  . Tachycardia [R00.0] 04/08/2011   Total Time spent with patient: 30 minutes  Subjective:   Christine Cobb is a 39 y.o. 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  She reports sleep and appetite are "good".    HPI:  AA female, 39 years old was evaluated for symptoms of Psychosis, yelling, aggressive behavior towards staff and  disorganized  While in jail.  She was placed on 4 point restraint on arrival and Geodon injection was given as she continued to exhibit agitation and disorganized behavior..  Patient,  as soon as she woke up remained  disorganized and could not participate in the interview.  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 Patient  cannot stay in her room for full interview and evaluation at this time..  Patient is asking for a discharge to go to work.  She has been accepted for admission and we will be seeking placement at any facility with available inpatient Psychiatric bed.    Patient  Is calmer and cooperative and is easily redirectable.  Patient remains delusional stating that she need to be discharged to go back to her numerous jobs.  Patient is requesting for combination of Risperdal, Benadryl and Ativan by injection only but does not know what the medications does for her.  Patient asked to be given injection and sent home because the next door patient was given injection and sent home.  She is responding to treatment but still need inpatient hospitalizations.  HPI Elements:  Location:  Bipolar affective disorder, current episode is manic. Quality:  severe, severe delusion of grandeur. Severity:  severe. Timing:  Acute. Duration:  Chronic mental illness. Context:  IVC from jail for agitataion..  Past Medical History:  Past Medical History  Diagnosis Date  . Sinus tachycardia   . HTN (hypertension)   . Arm pain   . Eye globe prosthesis   . CHF (congestive heart failure)   . Bipolar affective disorder, currently manic, mild   . Hyperthyroidism     Past Surgical History  Procedure Laterality Date  . Eye surgery    . Dilation and curettage of uterus     Family History:  Family History  Problem Relation Age of Onset  . Hypertension Other   . Emphysema  Other   . Asthma Son   . Diabetes Maternal Uncle   . Diabetes Paternal Grandmother    Social History:  History  Alcohol Use No     History  Drug Use  . Yes  . Special: Marijuana    Comment: over a month since used marijuana    History   Social History  . Marital Status: Married    Spouse Name: N/A  . Number of Children: N/A  . Years of Education: N/A   Social History Main Topics  . Smoking status: Current Every Day Smoker -- 1.00  packs/day    Types: Cigarettes    Last Attempt to Quit: 08/21/2013  . Smokeless tobacco: Never Used  . Alcohol Use: No  . Drug Use: Yes    Special: Marijuana     Comment: over a month since used marijuana  . Sexual Activity: Yes    Birth Control/ Protection: Injection   Other Topics Concern  . None   Social History Narrative   Additional Social History:    Pain Medications: UTA Prescriptions: UTA Over the Counter: UTA History of alcohol / drug use?: Yes Longest period of sobriety (when/how long): UTA Negative Consequences of Use:  (UTA) Withdrawal Symptoms:  (UTA) Name of Substance 1: Alcohol  (per previous assessment) 1 - Age of First Use: 15 1 - Amount (size/oz): "1 beer"  1 - Frequency: unknown  1 - Duration: ongoing  1 - Last Use / Amount: UTA                   Allergies:   Allergies  Allergen Reactions  . Amoxil [Amoxicillin] Anaphylaxis  . Trazodone And Nefazodone Anaphylaxis  . Ketorolac Tromethamine Hives and Swelling  . Prenatal [B-Plex Plus] Nausea Only  . Tegretol [Carbamazepine] Other (See Comments)    Swelling, skin peeling, skin discoloration    Labs:  No results found for this or any previous visit (from the past 48 hour(s)).  Vitals: Blood pressure 105/75, pulse 109, temperature 98.1 F (36.7 C), temperature source Oral, resp. rate 17, SpO2 100 %, not currently breastfeeding.  Risk to Self: Suicidal Ideation: No Suicidal Intent: No Is patient at risk for suicide?: Yes Suicidal Plan?: No Access to Means:  (UTA) What has been your use of drugs/alcohol within the last 12 months?:  (UTA) Other Self Harm Risks:  (UTA) Triggers for Past Attempts:  (UTA) Intentional Self Injurious Behavior:  (UTA) Risk to Others: Homicidal Ideation: No Thoughts of Harm to Others: No Current Homicidal Intent: No Current Homicidal Plan: No Access to Homicidal Means: No History of harm to others?:  (UTA) Assessment of Violence: On admission Violent  Behavior Description:  (in jail, in ED) Does patient have access to weapons?:  (UTA) Criminal Charges Pending?:  (UTA) Does patient have a court date:  (UTA) Prior Inpatient Therapy: Prior Inpatient Therapy: Yes Prior Therapy Dates: Dates unknown  Prior Therapy Facilty/Provider(s): [place]  Reason for Treatment: Psychosis  Prior Outpatient Therapy: Prior Outpatient Therapy:  (UTA) Does patient have an ACCT team?: Unknown Does patient have Intensive In-House Services?  : No Does patient have [facility] services? : Unknown Does patient have P4CC services?: No  Current Facility-Administered Medications  Medication Dose Route Frequency Provider Last Rate Last Dose  . amantadine (SYMMETREL) capsule 100 mg  100 mg Oral BID [name], NP   100 mg at 12/24/14 0952  . ibuprofen (ADVIL,MOTRIN) tablet 800 mg  800 mg Oral BID PRN Donnetta Hutching, MD  800 mg at 12/24/14 1001  . LORazepam (ATIVAN) tablet 1 mg  1 mg Oral Q8H PRN [name], NP   1 mg at 12/23/14 1948  . nicotine (NICODERM CQ - dosed in mg/24 hours) patch 21 mg  21 mg Transdermal Once [name], NP   21 mg at 12/23/14 0716  . nicotine polacrilex (NICORETTE) gum 2 mg  2 mg Oral PRN Donnetta Hutching, MD   2 mg at 12/24/14 1315  . OLANZapine zydis (ZYPREXA) disintegrating tablet 10 mg  10 mg Oral Q8H PRN Yoshito Gaza   10 mg at 12/23/14 1948  . temazepam (RESTORIL) capsule 7.5 mg  7.5 mg Oral QHS PRN Beau Fanny, FNP   7.5 mg at 12/23/14 1948  . white petrolatum (VASELINE) gel   Topical PRN Donnetta Hutching, MD      . ziprasidone (GEODON) capsule 60 mg  60 mg Oral BID WC Leata Mouse, MD   60 mg at 12/24/14 9528   Current Outpatient Prescriptions  Medication Sig Dispense Refill  . ALPRAZolam (XANAX) 1 MG tablet Take 1 tablet (1 mg total) by mouth at bedtime as needed for anxiety. (Patient not taking: Reported on 07/05/2014) 10 tablet 0  . furosemide (LASIX) 20 MG tablet Take 1 tablet (20 mg total) by mouth daily.  (Patient not taking: Reported on 06/01/2014) 10 tablet 0  . medroxyPROGESTERone (PROVERA) 10 MG tablet Take 1 tablet (10 mg total) by mouth daily. (Patient not taking: Reported on 06/01/2014) 30 tablet 0    Musculoskeletal: Strength & Muscle Tone: within normal limits Gait & Station: normal Patient leans: N/A  Psychiatric Specialty Exam: Physical Exam  Review of Systems  Constitutional: Negative.   HENT: Negative.   Eyes: Negative.   Cardiovascular: Negative.   Gastrointestinal: Negative.   Genitourinary: Negative.   Musculoskeletal: Negative.   Skin: Negative.   Neurological: Negative.   Endo/Heme/Allergies: Negative.   Psychiatric/Behavioral: Positive for hallucinations (mania, delusions). The patient is nervous/anxious and has insomnia.   All other systems reviewed and are negative.   Blood pressure 105/75, pulse 109, temperature 98.1 F (36.7 C), temperature source Oral, resp. rate 17, SpO2 100 %, not currently breastfeeding.There is no weight on file to calculate BMI.  General Appearance: Casual  Eye Contact::  Fair  Speech:  Normal  Volume: Normal  Mood:  Calm with intermittent disorganized thought  Affect:  Congruent  Thought Process:  Disorganized  Orientation:  Other:  oriented to self and place today  Thought Content:  Delusions and Hallucinations: Auditory Visual  Suicidal Thoughts:  No  Homicidal Thoughts:  No  Memory:  Immediate;   Good Recent;   Fair Remote;   Fair  Judgement:  Fair  Insight:  Fair  Psychomotor Activity:  Normal  Concentration:  Fair  Recall:  NA  Fund of Knowledge:Fair  Language: Poor  Akathisia:  NA  Handed:  Right  AIMS (if indicated):     Assets:  Desire for Improvement  ADL's:  Intact  Cognition: Impaired,  Severe  Sleep:      Medical Decision Making: Review of Psycho-Social Stressors (1), Decision to obtain old records (1), Established Problem, Worsening (2), Review of Medication Regimen & Side Effects (2) and Review of New  Medication or Change in Dosage (2)  Treatment Plan Summary: Patient is responding to treatment and is easily redirectable.   She is being considered to be switched over to an injectable antipsychotic since she is not compliant with her medications.  Daily contact with patient to assess and evaluate symptoms and progress in treatment and Medication management  Plan:  Continue these medications.  Plan is to change to an antipsychotic with injectable route considering issue with non compliance. -Continue Ativan 1mg  PO q8h PRN agitation -Continue  Geodon 60 mg bid wc for psychosis.  -Continue Restoril 7.5mg  qhs PRN insomnia  Disposition:  -Inpatient psychiatric hospitalization for safety and stabilization and medication management.   [name], PMHNP-BC 12/24/2014 2:51 PM  Patient seen face-to-face for psychiatric evaluation, chart reviewed and case discussed with the physician extender and developed treatment plan. Reviewed the information documented and agree with the treatment plan. Thedore Mins, MD

## 2014-12-24 NOTE — BH Assessment (Signed)
[facility] Assessment Progress Note  The following facilities have been contacted to seek placement for this pt, with results as noted:  Beds available, information sent, decision pending:  [facility]  At capacity:  [facility]  Declined:  [facility] (no high acuity beds available) [facility] (behavioral acuity) [facility] (no high acuity beds available)   Doylene Canning, MA Triage Specialist [phone]

## 2014-12-25 DIAGNOSIS — F312 Bipolar disorder, current episode manic severe with psychotic features: Secondary | ICD-10-CM | POA: Diagnosis not present

## 2014-12-25 MED ORDER — HYDROXYZINE PAMOATE 25 MG PO CAPS: 25.0000 mg | ORAL_CAPSULE | Freq: Three times a day (TID) | ORAL | Status: DC | PRN

## 2014-12-25 MED ORDER — AMANTADINE HCL 100 MG PO CAPS: 100.0000 mg | ORAL_CAPSULE | Freq: Two times a day (BID) | ORAL | Status: DC

## 2014-12-25 MED ORDER — TEMAZEPAM 7.5 MG PO CAPS: 7.5000 mg | ORAL_CAPSULE | Freq: Every evening | ORAL | Status: DC | PRN

## 2014-12-25 MED ORDER — NICOTINE POLACRILEX 2 MG MT GUM: 2.0000 mg | CHEWING_GUM | OROMUCOSAL | Status: DC | PRN

## 2014-12-25 MED ORDER — ZIPRASIDONE HCL 60 MG PO CAPS: 60.0000 mg | ORAL_CAPSULE | Freq: Two times a day (BID) | ORAL | Status: DC

## 2014-12-25 NOTE — Consult Note (Signed)
[facility] Face-to-Face Psychiatry Consult   Reason for Consult:  Psychotic, Delusion Referring Physician: EDP Patient Identification: Christine Cobb MRN:  [id number] Principal Diagnosis: Bipolar affective disorder, current episode manic with psychotic symptoms Diagnosis:   Patient Active Problem List   Diagnosis Date Noted  . Bipolar affective disorder, current episode manic with psychotic symptoms [F31.2] 03/16/2014    Priority: High  . Psychoses [F29]   . Bipolar disorder, current episode manic without psychotic features, severe [F31.13]   . Agitation [R45.1]   . Manic behavior [F30.10]   . Psychosis [F29]   . Hyperthyroidism [E05.90] 09/21/2013  . Marijuana abuse [F12.10] 04/12/2013  . History of CHF (congestive heart failure) [Z86.79] 02/01/2013  . Abscess of abdominal wall [L02.211] 10/18/2012  . HTN (hypertension) [I10] 04/08/2011  . Tachycardia [R00.0] 04/08/2011   Total Time spent with patient: 30 minutes  Subjective:   Christine Cobb is a 39 y.o. 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  She reports sleep and appetite are "good".    HPI:  AA female, 39 years old was evaluated for symptoms of Psychosis, yelling, aggressive behavior towards staff and  disorganized  While in jail.  She was placed on 4 point restraint on arrival and Geodon injection was given as she continued to exhibit agitation and disorganized behavior..  Patient,  as soon as she woke up remained  disorganized and could not participate in the interview.  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 Patient  cannot stay in her room for full interview and evaluation at this time..  Patient is asking for a discharge to go to work.  She has been accepted for admission and we will be seeking placement at any facility with available inpatient Psychiatric bed.    Patient  Is calmer and cooperative and is easily redirectable.  Patient remains delusional stating that she need to be discharged to go back to her numerous jobs.  Patient is requesting for combination of Risperdal, Benadryl and Ativan by injection only but does not know what the medications does for her.  Patient asked to be given injection and sent home because the next door patient was given injection and sent home.  She is responding to treatment but still need inpatient hospitalizations.  12/25/14:  Patient has improved from acute psychotic symptoms to where is calm, cooperative and have good knowledge of her illness.  She states that she need to be on her medications and plans to go to [facility] to continue receiving treatment.  She still remains some how delusional stating that she owns some businesses.  Patient vehemently denies SI/HI/AVH.  Patient is discharged home and will go to [facility] for her MH care.   HPI Elements:  Location:  Bipolar affective disorder, current episode is manic. Quality:  severe, severe delusion of grandeur. Severity:  severe. Timing:  Acute. Duration:  Chronic mental illness. Context:  IVC from jail for agitataion..  Past Medical History:  Past Medical History  Diagnosis Date  . Sinus tachycardia   . HTN (hypertension)   .  Arm pain   . Eye globe prosthesis   . CHF (congestive heart failure)   . Bipolar affective disorder, currently manic, mild   . Hyperthyroidism     Past Surgical History  Procedure Laterality Date  . Eye surgery    . Dilation and curettage of uterus     Family History:  Family History  Problem Relation Age of Onset  . Hypertension Other   . Emphysema Other   . Asthma Son   .  Diabetes Maternal Uncle   . Diabetes Paternal Grandmother    Social History:  History  Alcohol Use No     History  Drug Use  . Yes  . Special: Marijuana    Comment: over a month since used marijuana    History   Social History  . Marital Status: Married    Spouse Name: N/A  . Number of Children: N/A  . Years of Education: N/A   Social History Main Topics  . Smoking status: Current Every Day Smoker -- 1.00 packs/day    Types: Cigarettes    Last Attempt to Quit: 08/21/2013  . Smokeless tobacco: Never Used  . Alcohol Use: No  . Drug Use: Yes    Special: Marijuana     Comment: over a month since used marijuana  . Sexual Activity: Yes    Birth Control/ Protection: Injection   Other Topics Concern  . None   Social History Narrative   Additional Social History:    Pain Medications: UTA Prescriptions: UTA Over the Counter: UTA History of alcohol / drug use?: Yes Longest period of sobriety (when/how long): UTA Negative Consequences of Use:  (UTA) Withdrawal Symptoms:  (UTA) Name of Substance 1: Alcohol  (per previous assessment) 1 - Age of First Use: 15 1 - Amount (size/oz): "1 beer"  1 - Frequency: unknown  1 - Duration: ongoing  1 - Last Use / Amount: UTA    Allergies:   Allergies  Allergen Reactions  . Amoxil [Amoxicillin] Anaphylaxis  . Trazodone And Nefazodone Anaphylaxis  . Ketorolac Tromethamine Hives and Swelling  . Prenatal [B-Plex Plus] Nausea Only  . Tegretol [Carbamazepine] Other (See Comments)    Swelling, skin peeling, skin discoloration    Labs:  No results found for this or any previous visit (from the past 48 hour(s)).  Vitals: Blood pressure 134/98, pulse 59, temperature 98 F (36.7 C), temperature source Oral, resp. rate 17, SpO2 100 %, not currently breastfeeding.  Risk to Self: Suicidal Ideation: No Suicidal Intent: No Is patient at risk for suicide?: Yes Suicidal Plan?: No Access to Means:  (UTA) What has been your use of  drugs/alcohol within the last 12 months?:  (UTA) Other Self Harm Risks:  (UTA) Triggers for Past Attempts:  (UTA) Intentional Self Injurious Behavior:  (UTA) Risk to Others: Homicidal Ideation: No Thoughts of Harm to Others: No Current Homicidal Intent: No Current Homicidal Plan: No Access to Homicidal Means: No History of harm to others?:  (UTA) Assessment of Violence: On admission Violent Behavior Description:  (in jail, in ED) Does patient have access to weapons?:  (UTA) Criminal Charges Pending?:  (UTA) Does patient have a court date:  (UTA) Prior Inpatient Therapy: Prior Inpatient Therapy: Yes Prior Therapy Dates: Dates unknown  Prior Therapy Facilty/Provider(s): [place]  Reason for Treatment: Psychosis  Prior Outpatient Therapy: Prior Outpatient Therapy:  (UTA) Does patient have an ACCT team?: Unknown Does patient have Intensive In-House Services?  : No Does patient have [facility] services? : Unknown  Does patient have P4CC services?: No  Current Facility-Administered Medications  Medication Dose Route Frequency Provider Last Rate Last Dose  . amantadine (SYMMETREL) capsule 100 mg  100 mg Oral BID [name], NP   100 mg at 12/25/14 0837  . ibuprofen (ADVIL,MOTRIN) tablet 800 mg  800 mg Oral BID PRN Donnetta Hutching, MD   800 mg at 12/25/14 0837  . LORazepam (ATIVAN) tablet 1 mg  1 mg Oral Q8H PRN [name], NP   1 mg at 12/24/14 2307  . nicotine (NICODERM CQ - dosed in mg/24 hours) patch 21 mg  21 mg Transdermal Once [name], NP   21 mg at 12/23/14 0716  . nicotine polacrilex (NICORETTE) gum 2 mg  2 mg Oral PRN Donnetta Hutching, MD   2 mg at 12/25/14 0837  . OLANZapine zydis (ZYPREXA) disintegrating tablet 10 mg  10 mg Oral Q8H PRN Norvella Loscalzo   10 mg at 12/24/14 2134  . temazepam (RESTORIL) capsule 7.5 mg  7.5 mg Oral QHS PRN Beau Fanny, FNP   7.5 mg at 12/24/14 2131  . white petrolatum (VASELINE) gel   Topical PRN Donnetta Hutching, MD      . ziprasidone  (GEODON) capsule 60 mg  60 mg Oral BID WC Leata Mouse, MD   60 mg at 12/25/14 9528   Current Outpatient Prescriptions  Medication Sig Dispense Refill  . ALPRAZolam (XANAX) 1 MG tablet Take 1 tablet (1 mg total) by mouth at bedtime as needed for anxiety. (Patient not taking: Reported on 07/05/2014) 10 tablet 0  . furosemide (LASIX) 20 MG tablet Take 1 tablet (20 mg total) by mouth daily. (Patient not taking: Reported on 06/01/2014) 10 tablet 0  . medroxyPROGESTERone (PROVERA) 10 MG tablet Take 1 tablet (10 mg total) by mouth daily. (Patient not taking: Reported on 06/01/2014) 30 tablet 0    Musculoskeletal: Strength & Muscle Tone: within normal limits Gait & Station: normal Patient leans: N/A  Psychiatric Specialty Exam: Physical Exam  Review of Systems  Constitutional: Negative.   HENT: Negative.   Eyes: Negative.   Cardiovascular: Negative.   Gastrointestinal: Negative.   Genitourinary: Negative.   Musculoskeletal: Negative.   Skin: Negative.   Neurological: Negative.   Endo/Heme/Allergies: Negative.   Psychiatric/Behavioral: Positive for hallucinations (mania, delusions). The patient is nervous/anxious and has insomnia.   All other systems reviewed and are negative.   Blood pressure 134/98, pulse 59, temperature 98 F (36.7 C), temperature source Oral, resp. rate 17, SpO2 100 %, not currently breastfeeding.There is no weight on file to calculate BMI.  General Appearance: Casual  Eye Contact::  Fair  Speech:  Normal  Volume: Normal  Mood:  Calm and less delusional  Affect:  Congruent  Thought Process:  Coherent  Orientation:  Other:  oriented to self and place today  Thought Content:  Delusions,less delusional today  Suicidal Thoughts:  No  Homicidal Thoughts:  No  Memory:  Immediate;   Good Recent;   Fair Remote;   Fair  Judgement:  Fair  Insight:  Good  Psychomotor Activity:  Normal  Concentration:  Good  Recall:  NA  Fund of Knowledge:Fair  Language:  Good  Akathisia:  NA  Handed:  Right  AIMS (if indicated):     Assets:  Desire for Improvement  ADL's:  Intact  Cognition: WNL  Sleep:      Medical Decision Making: Established Problem, Stable/Improving (1)  Disposition:  Discharge home , follow up with [facility]  for your MH needs and medication Management. [name], PMHNP-BC 12/25/2014 12:46 PM  Patient seen face-to-face for psychiatric evaluation, chart reviewed and case discussed with the physician extender and developed treatment plan. Reviewed the information documented and agree with the treatment plan. Thedore Mins, MD

## 2014-12-25 NOTE — BHH Suicide Risk Assessment (Cosign Needed)
Suicide Risk Assessment  Discharge Assessment   [facility] Discharge Suicide Risk Assessment   Demographic Factors:  Low socioeconomic status and Unemployed  Total Time spent with patient: 20 minutes  Musculoskeletal: Strength & Muscle Tone: within normal limits Gait & Station: normal Patient leans: N/A  Psychiatric Specialty Exam:     Blood pressure 134/98, pulse 59, temperature 98 F (36.7 C), temperature source Oral, resp. rate 17, SpO2 100 %, not currently breastfeeding.There is no weight on file to calculate BMI.  General Appearance: Casual  Eye Contact::  Fair  Speech:  Normal  Volume: Normal  Mood:  Calm and less delusional  Affect:  Congruent  Thought Process:  Coherent  Orientation:  Other:  oriented to self and place today  Thought Content:  Delusions,less delusional today  Suicidal Thoughts:  No  Homicidal Thoughts:  No  Memory:  Immediate;   Good Recent;   Fair Remote;   Fair  Judgement:  Fair  Insight:  Good  Psychomotor Activity:  Normal  Concentration:  Good  Recall:  NA  Fund of Knowledge:Fair  Language: Good  Akathisia:  NA  Handed:  Right  AIMS (if indicated):     Assets:  Desire for Improvement  ADL's:  Intact  Cognition: WNL        Has this patient used any form of tobacco in the last 30 days? (Cigarettes, Smokeless Tobacco, Cigars, and/or Pipes) No, Prescription for Nicotine gum is given  Mental Status Per Nursing Assessment::   On Admission:     Current Mental Status by Physician: NA  Loss Factors: NA  Historical Factors: NA  Risk Reduction Factors:   Responsible for children under 36 years of age, Religious beliefs about death, Living with another person, especially a relative and Positive therapeutic relationship  Continued Clinical Symptoms:  Bipolar Disorder:   Mixed State  Cognitive Features That Contribute To Risk:  Polarized thinking    Suicide Risk:  Minimal: No identifiable suicidal ideation.  Patients presenting with  no risk factors but with morbid ruminations; may be classified as minimal risk based on the severity of the depressive symptoms  Principal Problem: Bipolar affective disorder, current episode manic with psychotic symptoms Discharge Diagnoses:  Patient Active Problem List   Diagnosis Date Noted  . Bipolar affective disorder, current episode manic with psychotic symptoms [F31.2] 03/16/2014    Priority: High  . Psychoses [F29]   . Bipolar disorder, current episode manic without psychotic features, severe [F31.13]   . Agitation [R45.1]   . Manic behavior [F30.10]   . Psychosis [F29]   . Hyperthyroidism [E05.90] 09/21/2013  . Marijuana abuse [F12.10] 04/12/2013  . History of CHF (congestive heart failure) [Z86.79] 02/01/2013  . Abscess of abdominal wall [L02.211] 10/18/2012  . HTN (hypertension) [I10] 04/08/2011  . Tachycardia [R00.0] 04/08/2011    Follow-up Information    Schedule an appointment as soon as possible for a visit with Burtis Junes, MD.   Specialty:  Family Medicine   Contact information:   [address] [address] [place] [address] [phone]       Plan Of Care/Follow-up recommendations:  Activity:  as tolerated Diet:  low salt/sodium diet  Is patient on multiple antipsychotic therapies at discharge:  No   Has Patient had three or more failed trials of antipsychotic monotherapy by history:  No  Recommended Plan for Multiple Antipsychotic Therapies: NA    Zackrey Dyar C   PMHNP-BC 12/25/2014, 1:09 PM

## 2014-12-25 NOTE — BH Assessment (Addendum)
Dr. Jannifer Franklin and Julieanne Cotton, NP recommend discharge home. Patient sts that her  current provider is the ACT team [facility]) [address] [place], [place] [address] [phone]. Patient stating that she has notified the ACT provider that she will be discharged today. Sts that she spoke to Josefa Half (ACT provider). Patient plans to see that provider today. Patient gave this Clinical research associate consent to contact the provider at [facility]. Staff at the facility state that they do no provide ACT services. Writer then contacted  [employer] provider Darrick Penna) and left a voicemail [phone].

## 2014-12-30 ENCOUNTER — Encounter (HOSPITAL_COMMUNITY): Payer: Self-pay

## 2014-12-30 ENCOUNTER — Emergency Department (HOSPITAL_COMMUNITY)
Admission: EM | Admit: 2014-12-30 | Discharge: 2015-01-01 | Disposition: A | Discharge status: Home / Self Care | Payer: Medicare Other | Attending: Emergency Medicine | Admitting: Emergency Medicine

## 2014-12-30 DIAGNOSIS — F312 Bipolar disorder, current episode manic severe with psychotic features: Secondary | ICD-10-CM | POA: Insufficient documentation

## 2014-12-30 DIAGNOSIS — I509 Heart failure, unspecified: Secondary | ICD-10-CM | POA: Diagnosis not present

## 2014-12-30 DIAGNOSIS — Z3202 Encounter for pregnancy test, result negative: Secondary | ICD-10-CM | POA: Diagnosis not present

## 2014-12-30 DIAGNOSIS — Z79899 Other long term (current) drug therapy: Secondary | ICD-10-CM | POA: Diagnosis not present

## 2014-12-30 DIAGNOSIS — F1410 Cocaine abuse, uncomplicated: Secondary | ICD-10-CM | POA: Insufficient documentation

## 2014-12-30 DIAGNOSIS — I10 Essential (primary) hypertension: Secondary | ICD-10-CM | POA: Diagnosis not present

## 2014-12-30 DIAGNOSIS — F99 Mental disorder, not otherwise specified: Secondary | ICD-10-CM | POA: Diagnosis not present

## 2014-12-30 DIAGNOSIS — F911 Conduct disorder, childhood-onset type: Secondary | ICD-10-CM | POA: Insufficient documentation

## 2014-12-30 DIAGNOSIS — Z8639 Personal history of other endocrine, nutritional and metabolic disease: Secondary | ICD-10-CM | POA: Diagnosis not present

## 2014-12-30 DIAGNOSIS — F1310 Sedative, hypnotic or anxiolytic abuse, uncomplicated: Secondary | ICD-10-CM | POA: Insufficient documentation

## 2014-12-30 DIAGNOSIS — F29 Unspecified psychosis not due to a substance or known physiological condition: Secondary | ICD-10-CM | POA: Diagnosis not present

## 2014-12-30 DIAGNOSIS — R000 Tachycardia, unspecified: Secondary | ICD-10-CM | POA: Insufficient documentation

## 2014-12-30 DIAGNOSIS — F3110 Bipolar disorder, current episode manic without psychotic features, unspecified: Secondary | ICD-10-CM | POA: Diagnosis present

## 2014-12-30 DIAGNOSIS — F309 Manic episode, unspecified: Secondary | ICD-10-CM | POA: Diagnosis present

## 2014-12-30 DIAGNOSIS — Z8669 Personal history of other diseases of the nervous system and sense organs: Secondary | ICD-10-CM | POA: Insufficient documentation

## 2014-12-30 DIAGNOSIS — F319 Bipolar disorder, unspecified: Secondary | ICD-10-CM | POA: Diagnosis not present

## 2014-12-30 DIAGNOSIS — Z720 Tobacco use: Secondary | ICD-10-CM | POA: Diagnosis not present

## 2014-12-30 LAB — COMPREHENSIVE METABOLIC PANEL
ALBUMIN: 3.9 g/dL (ref 3.5–5.0)
ALK PHOS: 53 U/L (ref 38–126)
ALT: 22 U/L (ref 14–54)
ANION GAP: 9 (ref 5–15)
AST: 23 U/L (ref 15–41)
BUN: 16 mg/dL (ref 6–20)
CO2: 24 mmol/L (ref 22–32)
CREATININE: 1.12 mg/dL — AB (ref 0.44–1.00)
Calcium: 8.5 mg/dL — ABNORMAL LOW (ref 8.9–10.3)
Chloride: 105 mmol/L (ref 101–111)
GFR calc Af Amer: >60 mL/min (ref >60)
GFR calc non Af Amer: >60 mL/min (ref >60)
GLUCOSE: 88 mg/dL (ref 65–99)
Potassium: 3.8 mmol/L (ref 3.5–5.1)
Sodium: 138 mmol/L (ref 135–145)
Total Bilirubin: 1.9 mg/dL — ABNORMAL HIGH (ref 0.3–1.2)
Total Protein: 6.4 g/dL — ABNORMAL LOW (ref 6.5–8.1)

## 2014-12-30 LAB — RAPID URINE DRUG SCREEN, HOSP PERFORMED
Amphetamines: NOT DETECTED
Barbiturates: NOT DETECTED
Benzodiazepines: POSITIVE — AB
COCAINE: POSITIVE — AB
Opiates: NOT DETECTED
TETRAHYDROCANNABINOL: NOT DETECTED

## 2014-12-30 LAB — CBC
HEMATOCRIT: 34.5 % — AB (ref 36.0–46.0)
HEMOGLOBIN: 11.6 g/dL — AB (ref 12.0–15.0)
MCH: 28.9 pg (ref 26.0–34.0)
MCHC: 33.6 g/dL (ref 30.0–36.0)
MCV: 86 fL (ref 78.0–100.0)
Platelets: 287 10*3/uL (ref 150–400)
RBC: 4.01 MIL/uL (ref 3.87–5.11)
RDW: 14.2 % (ref 11.5–15.5)
WBC: 7 10*3/uL (ref 4.0–10.5)

## 2014-12-30 LAB — ETHANOL: Alcohol, Ethyl (B): <5 mg/dL (ref <5)

## 2014-12-30 LAB — SALICYLATE LEVEL: Salicylate Lvl: <4 mg/dL (ref 2.8–30.0)

## 2014-12-30 LAB — PREGNANCY, URINE: PREG TEST UR: NEGATIVE

## 2014-12-30 LAB — ACETAMINOPHEN LEVEL

## 2014-12-30 MED ORDER — LORAZEPAM 2 MG/ML IJ SOLN
2.0000 mg | Freq: Once | INTRAMUSCULAR | Status: DC
Start: 2014-12-30 — End: 2014-12-30
  Filled 2014-12-30: qty 1

## 2014-12-30 MED ORDER — LORAZEPAM 2 MG/ML IJ SOLN
2.0000 mg | Freq: Once | INTRAMUSCULAR | Status: AC
  Administered 2014-12-30: 2 mg via INTRAMUSCULAR
  Filled 2014-12-30: qty 1

## 2014-12-30 MED ORDER — LORAZEPAM 2 MG/ML IJ SOLN
1.0000 mg | Freq: Once | INTRAMUSCULAR | Status: AC
  Administered 2014-12-30: 1 mg via INTRAMUSCULAR
  Filled 2014-12-30: qty 1

## 2014-12-30 MED ORDER — NICOTINE POLACRILEX 2 MG MT GUM
2.0000 mg | CHEWING_GUM | OROMUCOSAL | Status: DC | PRN
  Administered 2014-12-30 – 2015-01-01 (×4): 2 mg via ORAL
  Filled 2014-12-30 (×4): qty 1

## 2014-12-30 MED ORDER — AMANTADINE HCL 100 MG PO CAPS
100.0000 mg | ORAL_CAPSULE | Freq: Two times a day (BID) | ORAL | Status: DC
  Administered 2014-12-31: 100 mg via ORAL
  Filled 2014-12-30 (×4): qty 1

## 2014-12-30 MED ORDER — HYDROXYZINE HCL 25 MG PO TABS
25.0000 mg | ORAL_TABLET | Freq: Three times a day (TID) | ORAL | Status: DC | PRN
  Administered 2014-12-31: 25 mg via ORAL
  Filled 2014-12-30 (×3): qty 1

## 2014-12-30 MED ORDER — TEMAZEPAM 7.5 MG PO CAPS
7.5000 mg | ORAL_CAPSULE | Freq: Every evening | ORAL | Status: DC | PRN
  Administered 2014-12-31 (×2): 7.5 mg via ORAL
  Filled 2014-12-30 (×2): qty 1

## 2014-12-30 MED ORDER — ZIPRASIDONE MESYLATE 20 MG IM SOLR
20.0000 mg | Freq: Once | INTRAMUSCULAR | Status: AC
  Administered 2014-12-30: 20 mg via INTRAMUSCULAR
  Filled 2014-12-30: qty 20

## 2014-12-30 MED ORDER — LORAZEPAM 2 MG/ML IJ SOLN
1.0000 mg | Freq: Once | INTRAMUSCULAR | Status: AC
  Administered 2014-12-30: 1 mg via INTRAMUSCULAR

## 2014-12-30 MED ORDER — ZIPRASIDONE HCL 20 MG PO CAPS
40.0000 mg | ORAL_CAPSULE | Freq: Two times a day (BID) | ORAL | Status: DC
  Administered 2014-12-31 – 2015-01-01 (×3): 40 mg via ORAL
  Filled 2014-12-30 (×3): qty 2

## 2014-12-30 MED ORDER — STERILE WATER FOR INJECTION IJ SOLN
INTRAMUSCULAR | Status: AC
  Administered 2014-12-30: 07:00:00
  Filled 2014-12-30: qty 10

## 2014-12-30 MED ORDER — FUROSEMIDE 10 MG/ML IJ SOLN: 40.0000 mg | Freq: Once | INTRAMUSCULAR | Status: DC

## 2014-12-30 MED ORDER — ZIPRASIDONE HCL 20 MG PO CAPS: 60.0000 mg | ORAL_CAPSULE | Freq: Two times a day (BID) | ORAL | Status: DC

## 2014-12-30 NOTE — ED Notes (Signed)
Bed: YT11 Expected date:  Expected time:  Means of arrival:  Comments: EMS 39 yo female mental issues

## 2014-12-30 NOTE — BH Assessment (Signed)
Inpt recommended. TTS seeking placement. Sent referrals to: Marilynne Halsted, [place], [place], [place] Triage Specialist 12/30/2014 7:54 PM

## 2014-12-30 NOTE — BH Assessment (Signed)
Consulted with Christine Head, NP who recommends inpatient treatment at this time. Conrad recommended IVC due to patient attempted to leave.

## 2014-12-30 NOTE — ED Notes (Signed)
Patient arrives by EMS in a manic state/running in halls, screaming and yelling, smells of exhaust.  Patient was picked up by female and taken to his apartment-[employer] was called by female to remove her from his apartment because she would not leave.  [employer] called EMS because of patient's behavior.

## 2014-12-30 NOTE — ED Provider Notes (Signed)
CSN: [id number]     Arrival date & time 12/30/14  0622 History   First MD Initiated Contact with Patient 12/30/14 [phone]     Chief Complaint  Patient presents with  . Manic Behavior   Level V caveat: Altered mental status, psychosis  (Consider location/radiation/quality/duration/timing/severity/associated sxs/prior Treatment) HPI Christine Cobb is a 39 y.o. female who comes in with active psychosis speaking about being half Vietnamese/Nigerian and snakebites. 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 Patient is running and yelling in the halls. On arrival her EKG shows sinus tachycardia with a QTC of 516. Recent psych IP stay 6/23. Declines chest pain or shortness of breath. Patient is mildly agitated but does not appear to be a danger to herself or other people at this time.  Past Medical History  Diagnosis Date  . Sinus tachycardia   . HTN (hypertension)   . Arm pain   . Eye globe prosthesis   . CHF (congestive heart failure)   . Bipolar affective disorder, currently manic, mild   . Hyperthyroidism    Past Surgical History  Procedure Laterality Date  . Eye surgery    . Dilation and curettage of uterus     Family History  Problem Relation Age of Onset  . Hypertension Other   . Emphysema Other   . Asthma Son   . Diabetes Maternal Uncle   . Diabetes Paternal Grandmother    History  Substance Use Topics  . Smoking status: Current Every Day Smoker -- 1.00 packs/day    Types: Cigarettes    Last Attempt to Quit: 08/21/2013  . Smokeless tobacco: Never Used  . Alcohol Use: No   OB History    Gravida Para Term Preterm AB TAB SAB Ectopic Multiple Living   0 1   2     Review of Systems  Unable to perform ROS     Allergies  Amoxil; Trazodone and nefazodone; Ketorolac tromethamine; Prenatal; and Tegretol  Home Medications   Prior to Admission medications   Medication Sig Start Date End Date Taking? Authorizing Provider  amantadine (SYMMETREL)  100 MG capsule Take 1 capsule (100 mg total) by mouth 2 (two) times daily. 12/25/14   [name], NP  furosemide (LASIX) 20 MG tablet Take 1 tablet (20 mg total) by mouth daily. Patient not taking: Reported on 06/01/2014 01/15/14   Eber Hong, MD  hydrOXYzine (VISTARIL) 25 MG capsule Take 1 capsule (25 mg total) by mouth 3 (three) times daily as needed for anxiety. 12/25/14   [name], NP  nicotine polacrilex (NICORETTE) 2 MG gum Take 1 each (2 mg total) by mouth as needed for smoking cessation. 12/25/14   [name], NP  temazepam (RESTORIL) 7.5 MG capsule Take 1 capsule (7.5 mg total) by mouth at bedtime as needed for sleep. 12/25/14   [name], NP  ziprasidone (GEODON) 60 MG capsule Take 1 capsule (60 mg total) by mouth 2 (two) times daily with a meal. 12/25/14   [name], NP   BP 102/56 mmHg  Pulse 100  Temp(Src) 98.2 F (36.8 C) (Axillary)  Resp 14  SpO2 99% Physical Exam  Constitutional: She is oriented to person, place, and time. She appears well-developed and well-nourished.  HENT:  Head: Normocephalic and atraumatic.  Mouth/Throat: Oropharynx is clear and moist.  Eyes: Conjunctivae are normal. Pupils are equal, round, and reactive to light. Right eye exhibits no discharge. Left eye  exhibits no discharge. No scleral icterus.  Neck: Neck supple.  Cardiovascular: Regular rhythm and normal heart sounds.   Tachycardic  Pulmonary/Chest: Effort normal and breath sounds normal. No respiratory distress. She has no wheezes. She has no rales.  Abdominal: Soft. There is no tenderness.  Musculoskeletal: She exhibits no tenderness.  Neurological: She is alert and oriented to person, place, and time.  Cranial Nerves II-XII grossly intact  Skin: Skin is warm and dry. No rash noted.  Psychiatric: Her affect is labile. Her speech is rapid and/or pressured and tangential. She is agitated, aggressive, hyperactive and actively hallucinating. She expresses  impulsivity and inappropriate judgment.  Nursing note and vitals reviewed.   ED Course  Procedures (including critical care time) Labs Review Labs Reviewed  CBC - Abnormal; Notable for the following:    Hemoglobin 11.6 (*)    HCT 34.5 (*)    All other components within normal limits  COMPREHENSIVE METABOLIC PANEL - Abnormal; Notable for the following:    Creatinine, Ser 1.12 (*)    Calcium 8.5 (*)    Total Protein 6.4 (*)    Total Bilirubin 1.9 (*)    All other components within normal limits  ACETAMINOPHEN LEVEL - Abnormal; Notable for the following:    Acetaminophen (Tylenol), Serum <10 (*)    All other components within normal limits  URINE RAPID DRUG SCREEN, HOSP PERFORMED - Abnormal; Notable for the following:    Cocaine POSITIVE (*)    Benzodiazepines POSITIVE (*)    All other components within normal limits  ETHANOL  SALICYLATE LEVEL  POC URINE PREG, ED    Imaging Review No results found.   EKG Interpretation   Date/Time:  Monday December 30 2014 06:31:20 EDT Ventricular Rate:  105 PR Interval:  128 QRS Duration: 70 QT Interval:  369 QTC Calculation: 488 R Axis:   63 Text Interpretation:  Normal sinus rhythm Probable left ventricular  hypertrophy Confirmed by [facility]  MD, APRIL ([id number]) on 12/30/2014  6:34:59 AM     Meds given in ED:  Medications  ziprasidone (GEODON) injection 20 mg (20 mg Intramuscular Given 12/30/14 0653)  LORazepam (ATIVAN) injection 1 mg (1 mg Intramuscular Given 12/30/14 6045)  sterile water (preservative free) injection (  Given 12/30/14 0653)  LORazepam (ATIVAN) injection 1 mg (1 mg Intramuscular Given 12/30/14 0704)    New Prescriptions   No medications on file   Filed Vitals:   12/30/14 0635 12/30/14 0718 12/30/14 1217  BP: 131/100 104/65 102/56  Pulse: 95 94 100  Temp: 98.6 F (37 C)  98.2 F (36.8 C)  TempSrc: Oral  Axillary  Resp: 19 16 14   SpO2: 100% 99% 99%    MDM  Vitals stable -afebrile Repeat EKG shows QTC has  corrected, we'll administer Geodon for patient's active psychoses and agitation. After Geodon, Ativan, Pt resting comfortably in ED. PE--physical exam is not concerning further acute or emergent pathology. Labwork--evidence of cocaine and benzodiazepine's on UDS. Labs otherwise noncontributory Patient agitation, psychosis and slight tachycardia likely secondary to cocaine. However, we'll have TTS evaluate patient for secondary causes psychosis as patient has established psychiatric history. Psych hold orders placed  Pending TTS evaluation and subsequent disposition. Patient signed out to Eyvonne Mechanic, PA-C for disposition according to TTS evaluation. Feel the patient is stable from a medical standpoint for discharge home to follow-up with her primary care/monitor on an outpatient basis. I do not feel that she is a danger to herself or other people at this time.  Final diagnoses:  Psychosis, unspecified psychosis type       Joycie Peek, PA-C 12/30/14 [address] PA-C 12/30/14 1626  April Palumbo, MD 12/30/14 [phone]

## 2014-12-30 NOTE — ED Notes (Signed)
Patient resting with eyes closed and respirations even and unlabored.  Did not respond to phlebotomy stick or cath UA.  Continue hourly checks. Report to oncoming RN.

## 2014-12-30 NOTE — Consult Note (Signed)
[facility] Consult   Reason for Consult: Aggressive behavior, labile mood Referring Physician:  EDP Patient Identification: Christine Cobb MRN:  [id number] Principal Diagnosis:Bipolar affective disorder, current episode manic with psychotic symptoms  Diagnosis:   Patient Active Problem List   Diagnosis Date Noted  . Bipolar affective disorder, current episode manic with psychotic symptoms [F31.2] 03/16/2014    Priority: High  . Psychoses [F29]   . Bipolar disorder, current episode manic without psychotic features, severe [F31.13]   . Agitation [R45.1]   . Manic behavior [F30.10]   . Psychosis [F29]   . Hyperthyroidism [E05.90] 09/21/2013  . Marijuana abuse [F12.10] 04/12/2013  . History of CHF (congestive heart failure) [Z86.79] 02/01/2013  . Abscess of abdominal wall [L02.211] 10/18/2012  . HTN (hypertension) [I10] 04/08/2011  . Tachycardia [R00.0] 04/08/2011    Total Time spent with patient: 25 minutes  Subjective:   Christine Cobb is a 39 y.o. female patient admitted with labile mood and agitation. Pt seen and chart reviewed. Nursing staff notified this provider that pt was extremely agitated and had woken up suddenly, demanding to leave and have her belongings. Pt argued that "I have to go make some eyes, I have 20/20 vision and I can make green eyes at home with a veil over my face. It's easy!". Dr. Ayesha Rumpf, [place] arrived and spoke with pt as well and while in our presence, pt stated that she "bought this city" and supposedly purchased [place] so she can do what she wants. Pt presents as very psychotic and attempted to leave again, in the process of being IVC'd. Geodon/Ativan given IM again to sedate pt to prevent high risk of harm to self and staff members with increasing agitation, shouting, and aggression.   HPI: Christine Cobb is a 39 y.o. female "making green eyes and grey eyes off the hall tower" "making off springs inside your body" "I made them I'm  not done yet, I'll have to go back" "y'all came and got me and said I was coming here" Elmer Bales [phone] "he said he was trying to get some sleep and that wasn't true, by the time I left he was well behaved". Pt was evaluated by ED [facility] staff counselor yet pt became very agitated and attempted to leave the ED.  Pt has been to the ED many times and was most recently here within the past 30 days for exacerbation of known Bipolar with mania. Pt had to be given Geodon and Ativan IM before psychiatric staff arrived for assessment. Pt was somnolent the few times we attempted to wake pt up. Pt even slept through a urinary catheterization for a urine sample.  HPI Elements:   Location:  labile mood, aggression. Quality:  severe. Duration:  last one week. Context:  poor compliance with medication.  Past Medical History:  Past Medical History  Diagnosis Date  . Sinus tachycardia   . HTN (hypertension)   . Arm pain   . Eye globe prosthesis   . CHF (congestive heart failure)   . Bipolar affective disorder, currently manic, mild   . Hyperthyroidism     Past Surgical History  Procedure Laterality Date  . Eye surgery    . Dilation and curettage of uterus     Family History:  Family History  Problem Relation Age of Onset  . Hypertension Other   . Emphysema Other   . Asthma Son   . Diabetes Maternal Uncle   . Diabetes Paternal Grandmother  Social History:  History  Alcohol Use No     History  Drug Use  . Yes  . Special: Marijuana    Comment: over a month since used marijuana    History   Social History  . Marital Status: Married    Spouse Name: N/A  . Number of Children: N/A  . Years of Education: N/A   Social History Main Topics  . Smoking status: Current Every Day Smoker -- 1.00 packs/day    Types: Cigarettes    Last Attempt to Quit: 08/21/2013  . Smokeless tobacco: Never Used  . Alcohol Use: No  . Drug Use: Yes    Special: Marijuana     Comment: over a month  since used marijuana  . Sexual Activity: Yes    Birth Control/ Protection: Injection   Other Topics Concern  . None   Social History Narrative   Additional Social History:    Pain Medications: See MAR Prescriptions: SEE MAR Over the Counter: See MAR History of alcohol / drug use?: Yes Longest period of sobriety (when/how long): 3 years Negative Consequences of Use: Legal Name of Substance 1: Cocaine 1 - Age of First Use: 29 1 - Amount (size/oz): 20 1 - Frequency: varies 1 - Duration: ongoing 1 - Last Use / Amount: 3 days ago Name of Substance 2: Marijuana 2 - Age of First Use: 23 2 - Amount (size/oz): 3 blunts/day 2 - Frequency: varies 2 - Duration: ongoing 2 - Last Use / Amount: "a while"                 Allergies:   Allergies  Allergen Reactions  . Amoxil [Amoxicillin] Anaphylaxis  . Trazodone And Nefazodone Anaphylaxis  . Ketorolac Tromethamine Hives and Swelling  . Prenatal [B-Plex Plus] Nausea Only  . Tegretol [Carbamazepine] Other (See Comments)    Swelling, skin peeling, skin discoloration    Labs:  Results for orders placed or performed during the hospital encounter of 12/30/14 (from the past 48 hour(s))  CBC     Status: Abnormal   Collection Time: 12/30/14  8:39 AM  Result Value Ref Range   WBC 7.0 4.0 - 10.5 K/uL   RBC 4.01 3.87 - 5.11 MIL/uL   Hemoglobin 11.6 (L) 12.0 - 15.0 g/dL   HCT 34.5 (L) 36.0 - 46.0 %   MCV 86.0 78.0 - 100.0 fL   MCH 28.9 26.0 - 34.0 pg   MCHC 33.6 30.0 - 36.0 g/dL   RDW 14.2 11.5 - 15.5 %   Platelets 287 150 - 400 K/uL  Comprehensive metabolic panel     Status: Abnormal   Collection Time: 12/30/14  8:39 AM  Result Value Ref Range   Sodium 138 135 - 145 mmol/L   Potassium 3.8 3.5 - 5.1 mmol/L   Chloride 105 101 - 111 mmol/L   CO2 24 22 - 32 mmol/L   Glucose, Bld 88 65 - 99 mg/dL   BUN 16 6 - 20 mg/dL   Creatinine, Ser 1.12 (H) 0.44 - 1.00 mg/dL   Calcium 8.5 (L) 8.9 - 10.3 mg/dL   Total Protein 6.4 (L) 6.5 -  8.1 g/dL   Albumin 3.9 3.5 - 5.0 g/dL   AST 23 15 - 41 U/L   ALT 22 14 - 54 U/L   Alkaline Phosphatase 53 38 - 126 U/L   Total Bilirubin 1.9 (H) 0.3 - 1.2 mg/dL   GFR calc non Af Amer >60 >60 mL/min   GFR calc Af  Amer >60 >60 mL/min    Comment: (NOTE) The eGFR has been calculated using the CKD EPI equation. This calculation has not been validated in all clinical situations. eGFR's persistently <60 mL/min signify possible Chronic Kidney Disease.    Anion gap 9 5 - 15  Ethanol (ETOH)     Status: None   Collection Time: 12/30/14  8:39 AM  Result Value Ref Range   Alcohol, Ethyl (B) <5 <5 mg/dL    Comment:        LOWEST DETECTABLE LIMIT FOR SERUM ALCOHOL IS 5 mg/dL FOR MEDICAL PURPOSES ONLY   Acetaminophen level     Status: Abnormal   Collection Time: 12/30/14  8:39 AM  Result Value Ref Range   Acetaminophen (Tylenol), Serum <10 (L) 10 - 30 ug/mL    Comment:        THERAPEUTIC CONCENTRATIONS VARY SIGNIFICANTLY. A RANGE OF 10-30 ug/mL MAY BE AN EFFECTIVE CONCENTRATION FOR MANY PATIENTS. HOWEVER, SOME ARE BEST TREATED AT CONCENTRATIONS OUTSIDE THIS RANGE. ACETAMINOPHEN CONCENTRATIONS >150 ug/mL AT 4 HOURS AFTER INGESTION AND >50 ug/mL AT 12 HOURS AFTER INGESTION ARE OFTEN ASSOCIATED WITH TOXIC REACTIONS.   Salicylate level     Status: None   Collection Time: 12/30/14  8:39 AM  Result Value Ref Range   Salicylate Lvl <6.3 2.8 - 30.0 mg/dL  Urine rapid drug screen (hosp performed)not at [facility]     Status: Abnormal   Collection Time: 12/30/14 10:25 AM  Result Value Ref Range   Opiates NONE DETECTED NONE DETECTED   Cocaine POSITIVE (A) NONE DETECTED   Benzodiazepines POSITIVE (A) NONE DETECTED   Amphetamines NONE DETECTED NONE DETECTED   Tetrahydrocannabinol NONE DETECTED NONE DETECTED   Barbiturates NONE DETECTED NONE DETECTED    Comment:        DRUG SCREEN FOR MEDICAL PURPOSES ONLY.  IF CONFIRMATION IS NEEDED FOR ANY PURPOSE, NOTIFY LAB WITHIN 5 DAYS.        LOWEST  DETECTABLE LIMITS FOR URINE DRUG SCREEN Drug Class       Cutoff (ng/mL) Amphetamine      1000 Barbiturate      200 Benzodiazepine   016 Tricyclics       010 Opiates          300 Cocaine          300 THC              50     Vitals: Blood pressure 102/56, pulse 100, temperature 98.2 F (36.8 C), temperature source Axillary, resp. rate 14, SpO2 99 %, not currently breastfeeding.  Risk to Self: Suicidal Ideation: No Suicidal Intent: No Is patient at risk for suicide?: No Suicidal Plan?: No Access to Means: No What has been your use of drugs/alcohol within the last 12 months?: Cocaine, THC How many times?: 0 Other Self Harm Risks: Denies Triggers for Past Attempts: None known Intentional Self Injurious Behavior: None Risk to Others: Homicidal Ideation: No Thoughts of Harm to Others: No Current Homicidal Intent: No Current Homicidal Plan: No Access to Homicidal Means: No Identified Victim: Denies History of harm to others?: No Assessment of Violence: None Noted Violent Behavior Description: Denies Does patient have access to weapons?: No Criminal Charges Pending?: No Does patient have a court date: Yes Court Date: 01/07/15 Prior Inpatient Therapy: Prior Inpatient Therapy: Yes Prior Therapy Dates: [facility] Prior Therapy Facilty/Provider(s): [facility] Reason for Treatment: Psychosis Prior Outpatient Therapy: Prior Outpatient Therapy: Yes Prior Therapy Dates: 2011 Prior Therapy Facilty/Provider(s): [facility] Reason for Treatment: Psychosis Does patient have an ACCT team?: No Does patient have Intensive In-House Services?  : No Does patient have [facility] services? : No Does patient have P4CC services?: No  No current facility-administered medications for this encounter.   Current Outpatient Prescriptions  Medication Sig Dispense Refill  . amantadine (SYMMETREL) 100 MG capsule Take 1 capsule (100 mg total) by mouth 2 (two) times daily. 60 capsule 0  . furosemide  (LASIX) 20 MG tablet Take 1 tablet (20 mg total) by mouth daily. (Patient not taking: Reported on 06/01/2014) 10 tablet 0  . hydrOXYzine (VISTARIL) 25 MG capsule Take 1 capsule (25 mg total) by mouth 3 (three) times daily as needed for anxiety. 30 capsule 0  . nicotine polacrilex (NICORETTE) 2 MG gum Take 1 each (2 mg total) by mouth as needed for smoking cessation. 100 tablet 0  . temazepam (RESTORIL) 7.5 MG capsule Take 1 capsule (7.5 mg total) by mouth at bedtime as needed for sleep. 30 capsule 0  . ziprasidone (GEODON) 60 MG capsule Take 1 capsule (60 mg total) by mouth 2 (two) times daily with a meal. 60 capsule 0    Musculoskeletal: Strength & Muscle Tone: within normal limits Gait & Station: normal Patient leans: N/A  Psychiatric Specialty Exam: Physical Exam  Psychiatric: Thought content normal. Her affect is angry and labile. Her speech is rapid and/or pressured and tangential. She is agitated and aggressive. Cognition and memory are normal. She expresses impulsivity.    Review of Systems  Constitutional: Negative.   HENT: Negative.   Eyes: Negative.   Respiratory: Negative.   Cardiovascular: Negative.   Gastrointestinal: Negative.   Genitourinary: Negative.   Musculoskeletal: Negative.   Skin: Negative.   Neurological: Negative.   Endo/Heme/Allergies: Negative.   Psychiatric/Behavioral: Positive for hallucinations and substance abuse. The patient is nervous/anxious and has insomnia.   All other systems reviewed and are negative.   Blood pressure 102/56, pulse 100, temperature 98.2 F (36.8 C), temperature source Axillary, resp. rate 14, SpO2 99 %, not currently breastfeeding.There is no weight on file to calculate BMI.  General Appearance: Disheveled  Eye Contact::  Good  Speech:  Pressured  Volume:  Increased  Mood:  Irritable  Affect:  Labile  Thought Process:  Circumstantial, Disorganized and Loose  Orientation:  Full (Time, Place, and Person)  Thought Content:   grandiose  Suicidal Thoughts:  No  Homicidal Thoughts:  No  Memory:  Immediate;   Good Recent;   Good Remote;   Fair  Judgement:  Impaired  Insight:  Lacking  Psychomotor Activity:  Increased  Concentration:  Poor  Recall:  [employer] of Knowledge:Fair  Language: Fair  Akathisia:  No  Handed:  Right  AIMS (if indicated):     Assets:  Communication Skills  ADL's:  Intact  Cognition: WNL  Sleep:   poor   Medical Decision Making: Review or order clinical lab tests (1), Established Problem, Worsening (2), Review of Medication Regimen & Side Effects (2) and Review of New Medication or Change in Dosage (2).   Treatment Plan Summary: Daily contact with patient to assess and evaluate symptoms and progress in treatment.  Medication management: -Restart (on 12/31/14) Geodon 68m po BID for bipolar disorder (started by Dr. ADarleene Cleaver -Restart Symmetrel 1075mbid -Geodon 2017mM once with Ativan 2mg29m once for severe agitation/aggression and attempting to elope in spite of staff efforts.   Plan:  Recommend psychiatric Inpatient admission when medically cleared.  Disposition:  -  Admit to psychiatric hospital for safety and stabilization.   Benjamine Mola, FNP-BC 12/30/2014 5:32 PM  Case discussed and patient seen in rounds with NP
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
## 2014-12-30 NOTE — ED Notes (Signed)
Previous hourly assessment by this Clinical research associate.

## 2014-12-30 NOTE — ED Notes (Signed)
Pt calm and resting at present time; VS WNL.

## 2014-12-31 DIAGNOSIS — F1410 Cocaine abuse, uncomplicated: Secondary | ICD-10-CM | POA: Diagnosis not present

## 2014-12-31 DIAGNOSIS — F312 Bipolar disorder, current episode manic severe with psychotic features: Secondary | ICD-10-CM | POA: Diagnosis not present

## 2014-12-31 MED ORDER — ZIPRASIDONE MESYLATE 20 MG IM SOLR
20.0000 mg | Freq: Once | INTRAMUSCULAR | Status: AC
  Administered 2014-12-31: 20 mg via INTRAMUSCULAR

## 2014-12-31 MED ORDER — DIPHENHYDRAMINE HCL 50 MG/ML IJ SOLN
50.0000 mg | Freq: Once | INTRAMUSCULAR | Status: AC
  Administered 2014-12-31: 50 mg via INTRAMUSCULAR
  Filled 2014-12-31: qty 1

## 2014-12-31 MED ORDER — ZIPRASIDONE MESYLATE 20 MG IM SOLR
INTRAMUSCULAR | Status: AC
  Administered 2014-12-31: 20 mg via INTRAMUSCULAR
  Filled 2014-12-31: qty 20

## 2014-12-31 MED ORDER — OLANZAPINE 10 MG IM SOLR
5.0000 mg | Freq: Once | INTRAMUSCULAR | Status: AC
  Administered 2014-12-31: 5 mg via INTRAMUSCULAR
  Filled 2014-12-31: qty 10

## 2014-12-31 MED ORDER — LORAZEPAM 2 MG/ML IJ SOLN
2.0000 mg | Freq: Once | INTRAMUSCULAR | Status: AC
  Administered 2014-12-31: 2 mg via INTRAMUSCULAR
  Filled 2014-12-31: qty 1
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
## 2014-12-31 NOTE — ED Notes (Signed)
Security called once the pt began pacing the hallway asking to leave. Pt requesting to have her belongings and to take a shower.Pt allowed to take shower. Pt complaining that the water would not get hot, when the pt was offered help on getting the water hot she states she does not need help. After shower pt provided with a new set of scrubs to put on but the pt refuses and states she is going to put back on the dirty gown which she had taken off. Pt refusing to come out of the bathroom after shower stating that she would come out when she was ready.Pt asking to talk to charge nurse. Pt yelling and uncooperative with primary care nurse. Explained to the pt that she could not be discharged because she was involuntarily committed. Pt denies being involuntarily committed saying that she own the police department and she wants her wig and her am transmitter. Pt states she is hungry and wants to eat. Coffee and sandwich provided to the pt by the charge nurse. Pt agrees to take vital signs once the the charge nurse is at the bedside with security

## 2014-12-31 NOTE — Consult Note (Signed)
[facility] Consult   Reason for Consult: Aggressive behavior, labile mood Referring Physician:  EDP Patient Identification: ABBRIELLE Cobb MRN:  [id number] Principal Diagnosis:Bipolar affective disorder, current episode manic with psychotic symptoms  Diagnosis:   Patient Active Problem List   Diagnosis Date Noted  . Cocaine abuse [F14.10] 12/31/2014    Priority: High  . Bipolar affective disorder, current episode manic with psychotic symptoms [F31.2] 03/16/2014    Priority: High  . Psychoses [F29]   . Bipolar disorder, current episode manic without psychotic features, severe [F31.13]   . Agitation [R45.1]   . Manic behavior [F30.10]   . Psychosis [F29]   . Hyperthyroidism [E05.90] 09/21/2013  . Marijuana abuse [F12.10] 04/12/2013  . History of CHF (congestive heart failure) [Z86.79] 02/01/2013  . Abscess of abdominal wall [L02.211] 10/18/2012  . HTN (hypertension) [I10] 04/08/2011  . Tachycardia [R00.0] 04/08/2011    Total Time spent with patient: 30 minutes  Subjective:   Christine Cobb is a 39 y.o. female patient admitted with labile mood and agitation. Pt seen and chart reviewed. Nursing staff notified this provider that pt was extremely agitated and had woken up suddenly, demanding to leave and have her belongings. Pt argued that "I have to go make some eyes, I have 20/20 vision and I can make green eyes at home with a veil over my face. It's easy!". Dr. Ayesha Rumpf, [facility] arrived and spoke with pt as well and while in our presence, pt stated that she "bought this city" and supposedly purchased [place] so she can do what she wants. Pt presents as very psychotic and attempted to leave again, in the process of being IVC'd. Geodon/Ativan given IM again to sedate pt to prevent high risk of harm to self and staff members with increasing agitation, shouting, and aggression.   HPI: Christine Cobb is a 39 y.o. female "making green eyes and grey eyes off the hall  tower" "making off springs inside your body" "I made them I'm not done yet, I'll have to go back" "y'all came and got me and said I was coming here" Elmer Bales [phone] "he said he was trying to get some sleep and that wasn't true, by the time I left he was well behaved". Pt was evaluated by ED [facility] staff counselor yet pt became very agitated and attempted to leave the ED.  Pt has been to the ED many times and was most recently here within the past 30 days for exacerbation of known Bipolar with mania. Pt had to be given Geodon and Ativan IM before psychiatric staff arrived for assessment. Pt was somnolent the few times we attempted to wake pt up. Pt even slept through a urinary catheterization for a urine sample.  7/5:  Patient remains delusional with an increase in energy.  She states she got her check and "bought the town. My 43 yo son's birthday is this month."  Elevated mood, pleasant.  Requests injections for mood stability.  HPI Elements:   Location:  labile mood, aggression. Quality:  severe. Duration:  last one week. Context:  poor compliance with medication.  Past Medical History:  Past Medical History  Diagnosis Date  . Sinus tachycardia   . HTN (hypertension)   . Arm pain   . Eye globe prosthesis   . CHF (congestive heart failure)   . Bipolar affective disorder, currently manic, mild   . Hyperthyroidism     Past Surgical History  Procedure Laterality Date  . Eye surgery    .  Dilation and curettage of uterus     Family History:  Family History  Problem Relation Age of Onset  . Hypertension Other   . Emphysema Other   . Asthma Son   . Diabetes Maternal Uncle   . Diabetes Paternal Grandmother    Social History:  History  Alcohol Use No     History  Drug Use  . Yes  . Special: Marijuana    Comment: over a month since used marijuana    History   Social History  . Marital Status: Married    Spouse Name: N/A  . Number of Children: N/A  . Years of  Education: N/A   Social History Main Topics  . Smoking status: Current Every Day Smoker -- 1.00 packs/day    Types: Cigarettes    Last Attempt to Quit: 08/21/2013  . Smokeless tobacco: Never Used  . Alcohol Use: No  . Drug Use: Yes    Special: Marijuana     Comment: over a month since used marijuana  . Sexual Activity: Yes    Birth Control/ Protection: Injection   Other Topics Concern  . None   Social History Narrative   Additional Social History:    Pain Medications: See MAR Prescriptions: SEE MAR Over the Counter: See MAR History of alcohol / drug use?: Yes Longest period of sobriety (when/how long): 3 years Negative Consequences of Use: Legal Name of Substance 1: Cocaine 1 - Age of First Use: 29 1 - Amount (size/oz): 20 1 - Frequency: varies 1 - Duration: ongoing 1 - Last Use / Amount: 3 days ago Name of Substance 2: Marijuana 2 - Age of First Use: 23 2 - Amount (size/oz): 3 blunts/day 2 - Frequency: varies 2 - Duration: ongoing 2 - Last Use / Amount: "a while"                 Allergies:   Allergies  Allergen Reactions  . Amoxil [Amoxicillin] Anaphylaxis  . Trazodone And Nefazodone Anaphylaxis  . Ketorolac Tromethamine Hives and Swelling  . Prenatal [B-Plex Plus] Nausea Only  . Tegretol [Carbamazepine] Other (See Comments)    Swelling, skin peeling, skin discoloration    Labs:  Results for orders placed or performed during the hospital encounter of 12/30/14 (from the past 48 hour(s))  CBC     Status: Abnormal   Collection Time: 12/30/14  8:39 AM  Result Value Ref Range   WBC 7.0 4.0 - 10.5 K/uL   RBC 4.01 3.87 - 5.11 MIL/uL   Hemoglobin 11.6 (L) 12.0 - 15.0 g/dL   HCT 34.5 (L) 36.0 - 46.0 %   MCV 86.0 78.0 - 100.0 fL   MCH 28.9 26.0 - 34.0 pg   MCHC 33.6 30.0 - 36.0 g/dL   RDW 14.2 11.5 - 15.5 %   Platelets 287 150 - 400 K/uL  Comprehensive metabolic panel     Status: Abnormal   Collection Time: 12/30/14  8:39 AM  Result Value Ref Range    Sodium 138 135 - 145 mmol/L   Potassium 3.8 3.5 - 5.1 mmol/L   Chloride 105 101 - 111 mmol/L   CO2 24 22 - 32 mmol/L   Glucose, Bld 88 65 - 99 mg/dL   BUN 16 6 - 20 mg/dL   Creatinine, Ser 1.12 (H) 0.44 - 1.00 mg/dL   Calcium 8.5 (L) 8.9 - 10.3 mg/dL   Total Protein 6.4 (L) 6.5 - 8.1 g/dL   Albumin 3.9 3.5 - 5.0 g/dL  AST 23 15 - 41 U/L   ALT 22 14 - 54 U/L   Alkaline Phosphatase 53 38 - 126 U/L   Total Bilirubin 1.9 (H) 0.3 - 1.2 mg/dL   GFR calc non Af Amer >60 >60 mL/min   GFR calc Af Amer >60 >60 mL/min    Comment: (NOTE) The eGFR has been calculated using the CKD EPI equation. This calculation has not been validated in all clinical situations. eGFR's persistently <60 mL/min signify possible Chronic Kidney Disease.    Anion gap 9 5 - 15  Ethanol (ETOH)     Status: None   Collection Time: 12/30/14  8:39 AM  Result Value Ref Range   Alcohol, Ethyl (B) <5 <5 mg/dL    Comment:        LOWEST DETECTABLE LIMIT FOR SERUM ALCOHOL IS 5 mg/dL FOR MEDICAL PURPOSES ONLY   Acetaminophen level     Status: Abnormal   Collection Time: 12/30/14  8:39 AM  Result Value Ref Range   Acetaminophen (Tylenol), Serum <10 (L) 10 - 30 ug/mL    Comment:        THERAPEUTIC CONCENTRATIONS VARY SIGNIFICANTLY. A RANGE OF 10-30 ug/mL MAY BE AN EFFECTIVE CONCENTRATION FOR MANY PATIENTS. HOWEVER, SOME ARE BEST TREATED AT CONCENTRATIONS OUTSIDE THIS RANGE. ACETAMINOPHEN CONCENTRATIONS >150 ug/mL AT 4 HOURS AFTER INGESTION AND >50 ug/mL AT 12 HOURS AFTER INGESTION ARE OFTEN ASSOCIATED WITH TOXIC REACTIONS.   Salicylate level     Status: None   Collection Time: 12/30/14  8:39 AM  Result Value Ref Range   Salicylate Lvl <7.9 2.8 - 30.0 mg/dL  Urine rapid drug screen (hosp performed)not at [facility]     Status: Abnormal   Collection Time: 12/30/14 10:25 AM  Result Value Ref Range   Opiates NONE DETECTED NONE DETECTED   Cocaine POSITIVE (A) NONE DETECTED   Benzodiazepines POSITIVE (A) NONE DETECTED    Amphetamines NONE DETECTED NONE DETECTED   Tetrahydrocannabinol NONE DETECTED NONE DETECTED   Barbiturates NONE DETECTED NONE DETECTED    Comment:        DRUG SCREEN FOR MEDICAL PURPOSES ONLY.  IF CONFIRMATION IS NEEDED FOR ANY PURPOSE, NOTIFY LAB WITHIN 5 DAYS.        LOWEST DETECTABLE LIMITS FOR URINE DRUG SCREEN Drug Class       Cutoff (ng/mL) Amphetamine      1000 Barbiturate      200 Benzodiazepine   150 Tricyclics       569 Opiates          300 Cocaine          300 THC              50   Pregnancy, urine     Status: None   Collection Time: 12/30/14  5:39 PM  Result Value Ref Range   Preg Test, Ur NEGATIVE NEGATIVE    Comment:        THE SENSITIVITY OF THIS METHODOLOGY IS >20 mIU/mL.     Vitals: Blood pressure 125/86, pulse 110, temperature 97.9 F (36.6 C), temperature source Oral, resp. rate 18, SpO2 100 %, not currently breastfeeding.  Risk to Self: Suicidal Ideation: No Suicidal Intent: No Is patient at risk for suicide?: No Suicidal Plan?: No Access to Means: No What has been your use of drugs/alcohol within the last 12 months?: Cocaine, THC How many times?: 0 Other Self Harm Risks: Denies Triggers for Past Attempts: None known Intentional Self Injurious Behavior: None Risk to Others:  Homicidal Ideation: No Thoughts of Harm to Others: No Current Homicidal Intent: No Current Homicidal Plan: No Access to Homicidal Means: No Identified Victim: Denies History of harm to others?: No Assessment of Violence: None Noted Violent Behavior Description: Denies Does patient have access to weapons?: No Criminal Charges Pending?: No Does patient have a court date: Yes Court Date: 01/07/15 Prior Inpatient Therapy: Prior Inpatient Therapy: Yes Prior Therapy Dates: [facility] Prior Therapy Facilty/Provider(s): [facility] Reason for Treatment: Psychosis Prior Outpatient Therapy: Prior Outpatient Therapy: Yes Prior Therapy Dates: 2011 Prior Therapy Facilty/Provider(s):  [facility] Reason for Treatment: Psychosis Does patient have an ACCT team?: No Does patient have Intensive In-House Services?  : No Does patient have [facility] services? : No Does patient have P4CC services?: No  Current Facility-Administered Medications  Medication Dose Route Frequency Provider Last Rate Last Dose  . amantadine (SYMMETREL) capsule 100 mg  100 mg Oral BID Benjamine Mola, FNP   100 mg at 12/31/14 4076  . diphenhydrAMINE (BENADRYL) injection 50 mg  50 mg Intramuscular Once Patrecia Pour, NP      . hydrOXYzine (ATARAX/VISTARIL) tablet 25 mg  25 mg Oral TID PRN Benjamine Mola, FNP      . LORazepam (ATIVAN) injection 2 mg  2 mg Intramuscular Once Patrecia Pour, NP      . nicotine polacrilex (NICORETTE) gum 2 mg  2 mg Oral PRN Benjamine Mola, FNP   2 mg at 12/31/14 0745  . OLANZapine (ZYPREXA) injection 5 mg  5 mg Intramuscular Once Patrecia Pour, NP      . temazepam (RESTORIL) capsule 7.5 mg  7.5 mg Oral QHS PRN Benjamine Mola, FNP   7.5 mg at 12/31/14 0219  . ziprasidone (GEODON) capsule 40 mg  40 mg Oral BID WC Benjamine Mola, FNP   40 mg at 12/31/14 8088   Current Outpatient Prescriptions  Medication Sig Dispense Refill  . amantadine (SYMMETREL) 100 MG capsule Take 1 capsule (100 mg total) by mouth 2 (two) times daily. 60 capsule 0  . hydrOXYzine (VISTARIL) 25 MG capsule Take 1 capsule (25 mg total) by mouth 3 (three) times daily as needed for anxiety. 30 capsule 0  . nicotine polacrilex (NICORETTE) 2 MG gum Take 1 each (2 mg total) by mouth as needed for smoking cessation. 100 tablet 0  . temazepam (RESTORIL) 7.5 MG capsule Take 1 capsule (7.5 mg total) by mouth at bedtime as needed for sleep. 30 capsule 0  . ziprasidone (GEODON) 60 MG capsule Take 1 capsule (60 mg total) by mouth 2 (two) times daily with a meal. 60 capsule 0    Musculoskeletal: Strength & Muscle Tone: within normal limits Gait & Station: normal Patient leans: N/A  Psychiatric  Specialty Exam: Physical Exam  Psychiatric: Thought content normal. Her affect is angry and labile. Her speech is rapid and/or pressured and tangential. She is agitated and aggressive. Cognition and memory are normal. She expresses impulsivity.    Review of Systems  Constitutional: Negative.   HENT: Negative.   Eyes: Negative.   Respiratory: Negative.   Cardiovascular: Negative.   Gastrointestinal: Negative.   Genitourinary: Negative.   Musculoskeletal: Negative.   Skin: Negative.   Neurological: Negative.   Endo/Heme/Allergies: Negative.   Psychiatric/Behavioral: Positive for hallucinations and substance abuse. The patient is nervous/anxious and has insomnia.   All other systems reviewed and are negative.   Blood pressure 125/86, pulse 110, temperature 97.9 F (36.6 C), temperature source Oral, resp.  rate 18, SpO2 100 %, not currently breastfeeding.There is no weight on file to calculate BMI.  General Appearance: Casual  Eye Contact::  Good  Speech:  Pressured  Volume:  Normal  Mood:  Pleasant  Affect:  Labile  Thought Process:  Disorganized  Orientation:  Full (Time, Place, and Person)  Thought Content:  grandiose  Suicidal Thoughts:  No  Homicidal Thoughts:  No  Memory:  Immediate;   Good Recent;   Good Remote;   Fair  Judgement:  Impaired  Insight:  Fair  Psychomotor Activity:  Increased  Concentration:  Fair  Recall:  [employer] of Knowledge:Fair  Language: Fair  Akathisia:  No  Handed:  Right  AIMS (if indicated):     Assets:  Communication Skills  ADL's:  Intact  Cognition: WNL  Sleep:   poor   Medical Decision Making: Review or order clinical lab tests (1), Established Problem, Worsening (2), Review of Medication Regimen & Side Effects (2) and Review of New Medication or Change in Dosage (2).   Treatment Plan Summary: Daily contact with patient to assess and evaluate symptoms and progress in treatment.  Medication management: -Restart (on 12/31/14)  Geodon 77m po BID for bipolar disorder (started by Dr. ADarleene Cleaver -Restart Symmetrel 1069mbid -ATivan 2 mg and Benadryl 50 mg IM for stability per patient request.  Plan:  Recommend psychiatric Inpatient admission when medically cleared.  Disposition:  -Admit to psychiatric hospital for safety and stabilization.   LOWaylan BogaPMFrazer/10/2014 5:18 PM Patient seen face-to-face for psychiatric evaluation, chart reviewed and case discussed with the physician extender and developed treatment plan. Reviewed the information documented and agree with the treatment plan. MoCorena PilgrimMD

## 2014-12-31 NOTE — ED Notes (Signed)
Patient intrusive, agitated by patient in 110. Requests medications to reduce anxiety and agitation.

## 2014-12-31 NOTE — ED Notes (Signed)
Requested and received Ativan 2mg  for anxiety.  Compliant with scheduled meds.

## 2014-12-31 NOTE — ED Notes (Signed)
Pt coming out of room taking gloves from dispenser and refusing to change into paper scrubs. Pt rubbing hand sanitizer into hair. Pt speaking very loudly and would not lower voice to help others sleep when asked. Pt became threatening when asked to change into scrubs and go back into room. 3 security officers and [employer] at bedside to assist in confiscating gloves and persuade pt to go back into room. Pt manic and says she owns [place].

## 2015-01-01 DIAGNOSIS — F312 Bipolar disorder, current episode manic severe with psychotic features: Secondary | ICD-10-CM | POA: Diagnosis not present

## 2015-01-01 NOTE — BH Assessment (Signed)
[facility] Assessment Progress Note  Per Carolanne Grumbling, MD, this pt does not require psychiatric hospitalization at this time.  She is to be released from IVC and discharged from [facility].  IVC has been rescinded.  Pt reports that she receives ACT Team support from [facility], and her therapist is Lianne Bushy [phone]).  She has signed Consent to Release Information to them.  At 10:00 this writer called Mr. Sharlot Gowda to notify him that pt is being discharged from [facility].  Pt's discharge instructions include contact information for [facility].  Pt's nurse, Carlisle Beers, has been notified.   Doylene Canning, MA Triage Specialist [phone]

## 2015-01-01 NOTE — [facility] Suicide Risk Assessment (Signed)
Suicide Risk Assessment  Discharge Assessment   [facility] Discharge Suicide Risk Assessment   Demographic Factors:  Living alone  Total Time spent with patient: 30 minutes   Musculoskeletal: Strength & Muscle Tone: within normal limits Gait & Station: normal Patient leans: N/A  Psychiatric Specialty Exam: Physical Exam  Psychiatric: Thought content normal. Her affect is angry and labile. Her speech is rapid and/or pressured and tangential. She is agitated and aggressive. Cognition and memory are normal. She expresses impulsivity.  The above was on admission.  Today, she is calm and cooperative with stabilization of mood.  Review of Systems  Constitutional: Negative.   HENT: Negative.   Eyes: Negative.   Respiratory: Negative.   Cardiovascular: Negative.   Gastrointestinal: Negative.   Genitourinary: Negative.   Musculoskeletal: Negative.   Skin: Negative.   Neurological: Negative.   Endo/Heme/Allergies: Negative.   Psychiatric/Behavioral: Positive for hallucinations and substance abuse. The patient is nervous/anxious and has insomnia.   All other systems reviewed and are negative. Psychiatric data above was on admission  Blood pressure 144/87, pulse 123, temperature 98.1 F (36.7 C), temperature source Oral, resp. rate 18, SpO2 100 %, not currently breastfeeding.There is no weight on file to calculate BMI.  General Appearance: Casual  Eye Contact::  Good  Speech:  Normal  Volume:  Normal  Mood:  Pleasant  Affect:   Congruent  Thought Process:  Coherent, clear  Orientation:  Full (Time, Place, and Person)  Thought Content:  WDL  Suicidal Thoughts:  No  Homicidal Thoughts:  No  Memory:  Immediate;   Good Recent;   Good Remote;   Fair  Judgement:  Fair  Insight:  Fair  Psychomotor Activity: Normal  Concentration:  Good  Recall:  Good  Fund of Knowledge: Good  Language: Good  Akathisia:  No  Handed:  Right  AIMS (if indicated):     Assets:  Communication Skills   ADL's:  Intact  Cognition: WNL  Sleep:   poor      Has this patient used any form of tobacco in the last 30 days? (Cigarettes, Smokeless Tobacco, Cigars, and/or Pipes) Yes, A prescription for an FDA-approved tobacco cessation medication was offered at discharge and the patient refused  Mental Status Per Nursing Assessment::   On Admission:   Cocaine abuse with psychosis and mania  Current Mental Status by Physician: NA  Loss Factors: NA  Historical Factors: NA  Risk Reduction Factors:   Sense of responsibility to family, Positive social support, Positive therapeutic relationship and Positive coping skills or problem solving skills  Continued Clinical Symptoms:  None  Cognitive Features That Contribute To Risk:  None    Suicide Risk:  Minimal: No identifiable suicidal ideation.  Patients presenting with no risk factors but with morbid ruminations; may be classified as minimal risk based on the severity of the depressive symptoms  Principal Problem: Bipolar affective disorder, current episode manic with psychotic symptoms Discharge Diagnoses:  Patient Active Problem List   Diagnosis Date Noted  . Cocaine abuse [F14.10] 12/31/2014    Priority: High  . Bipolar affective disorder, current episode manic with psychotic symptoms [F31.2] 03/16/2014    Priority: High  . Psychoses [F29]   . Bipolar disorder, current episode manic without psychotic features, severe [F31.13]   . Agitation [R45.1]   . Manic behavior [F30.10]   . Psychosis [F29]   . Hyperthyroidism [E05.90] 09/21/2013  . Marijuana abuse [F12.10] 04/12/2013  . History of CHF (congestive heart failure) [Z86.79] 02/01/2013  .  Abscess of abdominal wall [L02.211] 10/18/2012  . HTN (hypertension) [I10] 04/08/2011  . Tachycardia [R00.0] 04/08/2011      Plan Of Care/Follow-up recommendations:  Activity:  as tolerated Diet:  heart healthy diet  Is patient on multiple antipsychotic therapies at discharge:  No    Has Patient had three or more failed trials of antipsychotic monotherapy by history:  No  Recommended Plan for Multiple Antipsychotic Therapies: NA    LORD, JAMISON, PMH-NP 01/01/2015, 12:54 PM

## 2015-01-01 NOTE — Discharge Instructions (Signed)
For your ongoing behavioral health needs you are advised to continue treatment with [facility], your ACT Team provider:       Lianne Bushy      [facility]      [address]      [place], [place] [address]      [phone]

## 2015-01-01 NOTE — Consult Note (Signed)
[facility] Face-to-Face Psychiatry Consult   Reason for Consult: Aggressive behavior, labile mood Referring Physician:  EDP Patient Identification: Christine Cobb MRN:  [id number] Principal Diagnosis:Bipolar affective disorder, current episode manic with psychotic symptoms  Diagnosis:   Patient Active Problem List   Diagnosis Date Noted  . Cocaine abuse [F14.10] 12/31/2014    Priority: High  . Bipolar affective disorder, current episode manic with psychotic symptoms [F31.2] 03/16/2014    Priority: High  . Psychoses [F29]   . Bipolar disorder, current episode manic without psychotic features, severe [F31.13]   . Agitation [R45.1]   . Manic behavior [F30.10]   . Psychosis [F29]   . Hyperthyroidism [E05.90] 09/21/2013  . Marijuana abuse [F12.10] 04/12/2013  . History of CHF (congestive heart failure) [Z86.79] 02/01/2013  . Abscess of abdominal wall [L02.211] 10/18/2012  . HTN (hypertension) [I10] 04/08/2011  . Tachycardia [R00.0] 04/08/2011    Total Time spent with patient: 30 minutes  Subjective:   Christine Cobb is a 39 y.o. female patient who is well known to this ED and providers.  She was restarted on her medications and stabilized, at her baseline.  Christine Cobb denies suicidal/homicidal ideations, hallucinations, and alcohol abuse.  She does abuse cocaine, advised to avoid usage.  Christine Cobb feels ready to go home, has a place to live.  She is looking forward to her son's 17th birthday on the 23rd of July.  Stable for discharge.  HPI Elements:   Location:  labile mood, aggression. Quality:  severe. Duration:  last one week. Context:  poor compliance with medication.  Past Medical History:  Past Medical History  Diagnosis Date  . Sinus tachycardia   . HTN (hypertension)   . Arm pain   . Eye globe prosthesis   . CHF (congestive heart failure)   . Bipolar affective disorder, currently manic, mild   . Hyperthyroidism     Past Surgical History  Procedure Laterality Date   . Eye surgery    . Dilation and curettage of uterus     Family History:  Family History  Problem Relation Age of Onset  . Hypertension Other   . Emphysema Other   . Asthma Son   . Diabetes Maternal Uncle   . Diabetes Paternal Grandmother    Social History:  History  Alcohol Use No     History  Drug Use  . Yes  . Special: Marijuana    Comment: over a month since used marijuana    History   Social History  . Marital Status: Married    Spouse Name: N/A  . Number of Children: N/A  . Years of Education: N/A   Social History Main Topics  . Smoking status: Current Every Day Smoker -- 1.00 packs/day    Types: Cigarettes    Last Attempt to Quit: 08/21/2013  . Smokeless tobacco: Never Used  . Alcohol Use: No  . Drug Use: Yes    Special: Marijuana     Comment: over a month since used marijuana  . Sexual Activity: Yes    Birth Control/ Protection: Injection   Other Topics Concern  . None   Social History Narrative   Additional Social History:    Pain Medications: See MAR Prescriptions: SEE MAR Over the Counter: See MAR History of alcohol / drug use?: Yes Longest period of sobriety (when/how long): 3 years Negative Consequences of Use: Legal Name of Substance 1: Cocaine 1 - Age of First Use: 29 1 - Amount (size/oz): 20 1 - Frequency:  varies 1 - Duration: ongoing 1 - Last Use / Amount: 3 days ago Name of Substance 2: Marijuana 2 - Age of First Use: 23 2 - Amount (size/oz): 3 blunts/day 2 - Frequency: varies 2 - Duration: ongoing 2 - Last Use / Amount: "a while"                 Allergies:   Allergies  Allergen Reactions  . Amoxil [Amoxicillin] Anaphylaxis  . Trazodone And Nefazodone Anaphylaxis  . Ketorolac Tromethamine Hives and Swelling  . Prenatal [B-Plex Plus] Nausea Only  . Tegretol [Carbamazepine] Other (See Comments)    Swelling, skin peeling, skin discoloration    Labs:  Results for orders placed or performed during the hospital  encounter of 12/30/14 (from the past 48 hour(s))  Pregnancy, urine     Status: None   Collection Time: 12/30/14  5:39 PM  Result Value Ref Range   Preg Test, Ur NEGATIVE NEGATIVE    Comment:        THE SENSITIVITY OF THIS METHODOLOGY IS >20 mIU/mL.     Vitals: Blood pressure 144/87, pulse 123, temperature 98.1 F (36.7 C), temperature source Oral, resp. rate 18, SpO2 100 %, not currently breastfeeding.  Risk to Self: Suicidal Ideation: No Suicidal Intent: No Is patient at risk for suicide?: No Suicidal Plan?: No Access to Means: No What has been your use of drugs/alcohol within the last 12 months?: Cocaine, THC How many times?: 0 Other Self Harm Risks: Denies Triggers for Past Attempts: None known Intentional Self Injurious Behavior: None Risk to Others: Homicidal Ideation: No Thoughts of Harm to Others: No Current Homicidal Intent: No Current Homicidal Plan: No Access to Homicidal Means: No Identified Victim: Denies History of harm to others?: No Assessment of Violence: None Noted Violent Behavior Description: Denies Does patient have access to weapons?: No Criminal Charges Pending?: No Does patient have a court date: Yes Court Date: 01/07/15 Prior Inpatient Therapy: Prior Inpatient Therapy: Yes Prior Therapy Dates: [facility] Prior Therapy Facilty/Provider(s): [facility] Reason for Treatment: Psychosis Prior Outpatient Therapy: Prior Outpatient Therapy: Yes Prior Therapy Dates: 2011 Prior Therapy Facilty/Provider(s): [facility] Reason for Treatment: Psychosis Does patient have an ACCT team?: No Does patient have Intensive In-House Services?  : No Does patient have [facility] services? : No Does patient have P4CC services?: No  Current Facility-Administered Medications  Medication Dose Route Frequency Provider Last Rate Last Dose  . amantadine (SYMMETREL) capsule 100 mg  100 mg Oral BID Beau Fanny, FNP   100 mg at 12/31/14 6384  . hydrOXYzine  (ATARAX/VISTARIL) tablet 25 mg  25 mg Oral TID PRN Beau Fanny, FNP   25 mg at 12/31/14 1952  . nicotine polacrilex (NICORETTE) gum 2 mg  2 mg Oral PRN Beau Fanny, FNP   2 mg at 01/01/15 0840  . temazepam (RESTORIL) capsule 7.5 mg  7.5 mg Oral QHS PRN Beau Fanny, FNP   7.5 mg at 12/31/14 1952  . ziprasidone (GEODON) capsule 40 mg  40 mg Oral BID WC Beau Fanny, FNP   40 mg at 01/01/15 5364   Current Outpatient Prescriptions  Medication Sig Dispense Refill  . amantadine (SYMMETREL) 100 MG capsule Take 1 capsule (100 mg total) by mouth 2 (two) times daily. 60 capsule 0  . hydrOXYzine (VISTARIL) 25 MG capsule Take 1 capsule (25 mg total) by mouth 3 (three) times daily as needed for anxiety. 30 capsule 0  . nicotine polacrilex (NICORETTE) 2 MG  gum Take 1 each (2 mg total) by mouth as needed for smoking cessation. 100 tablet 0  . temazepam (RESTORIL) 7.5 MG capsule Take 1 capsule (7.5 mg total) by mouth at bedtime as needed for sleep. 30 capsule 0  . ziprasidone (GEODON) 60 MG capsule Take 1 capsule (60 mg total) by mouth 2 (two) times daily with a meal. 60 capsule 0    Musculoskeletal: Strength & Muscle Tone: within normal limits Gait & Station: normal Patient leans: N/A  Psychiatric Specialty Exam: Physical Exam  Psychiatric: Thought content normal. Her affect is angry and labile. Her speech is rapid and/or pressured and tangential. She is agitated and aggressive. Cognition and memory are normal. She expresses impulsivity.  The above was on admission.  Today, she is calm and cooperative with stabilization of mood.  Review of Systems  Constitutional: Negative.   HENT: Negative.   Eyes: Negative.   Respiratory: Negative.   Cardiovascular: Negative.   Gastrointestinal: Negative.   Genitourinary: Negative.   Musculoskeletal: Negative.   Skin: Negative.   Neurological: Negative.   Endo/Heme/Allergies: Negative.   Psychiatric/Behavioral: Positive for hallucinations and  substance abuse. The patient is nervous/anxious and has insomnia.   All other systems reviewed and are negative. Psychiatric data above was on admission  Blood pressure 144/87, pulse 123, temperature 98.1 F (36.7 C), temperature source Oral, resp. rate 18, SpO2 100 %, not currently breastfeeding.There is no weight on file to calculate BMI.  General Appearance: Casual  Eye Contact::  Good  Speech:  Normal  Volume:  Normal  Mood:  Pleasant  Affect:   Congruent  Thought Process:  Coherent, clear  Orientation:  Full (Time, Place, and Person)  Thought Content:  WDL  Suicidal Thoughts:  No  Homicidal Thoughts:  No  Memory:  Immediate;   Good Recent;   Good Remote;   Fair  Judgement:  Fair  Insight:  Fair  Psychomotor Activity: Normal  Concentration:  Good  Recall:  Good  Fund of Knowledge: Good  Language: Good  Akathisia:  No  Handed:  Right  AIMS (if indicated):     Assets:  Communication Skills  ADL's:  Intact  Cognition: WNL  Sleep:   poor   Medical Decision Making: Review or order clinical lab tests (1), Established Problem, Worsening (2), Review of Medication Regimen & Side Effects (2) and Review of New Medication or Change in Dosage (2).   Treatment Plan Summary: Daily contact with patient to assess and evaluate symptoms and progress in treatment.  Plan: Discharge home  Disposition:  Discharge home with follow-up at [facility] (ACT team), her regular providers.  Nanine Means, PMH-NP 01/01/2015 12:46 PM

## 2015-01-02 ENCOUNTER — Encounter (HOSPITAL_COMMUNITY): Payer: Self-pay | Admitting: Emergency Medicine

## 2015-01-02 ENCOUNTER — Emergency Department (HOSPITAL_COMMUNITY)
Admission: EM | Admit: 2015-01-02 | Discharge: 2015-01-03 | Disposition: A | Discharge status: Home / Self Care | Payer: Medicare Other | Attending: Emergency Medicine | Admitting: Emergency Medicine

## 2015-01-02 DIAGNOSIS — F3110 Bipolar disorder, current episode manic without psychotic features, unspecified: Secondary | ICD-10-CM | POA: Diagnosis present

## 2015-01-02 DIAGNOSIS — Z046 Encounter for general psychiatric examination, requested by authority: Secondary | ICD-10-CM | POA: Diagnosis present

## 2015-01-02 DIAGNOSIS — Z8639 Personal history of other endocrine, nutritional and metabolic disease: Secondary | ICD-10-CM | POA: Diagnosis not present

## 2015-01-02 DIAGNOSIS — I10 Essential (primary) hypertension: Secondary | ICD-10-CM | POA: Diagnosis not present

## 2015-01-02 DIAGNOSIS — F1410 Cocaine abuse, uncomplicated: Secondary | ICD-10-CM | POA: Insufficient documentation

## 2015-01-02 DIAGNOSIS — F250 Schizoaffective disorder, bipolar type: Secondary | ICD-10-CM | POA: Insufficient documentation

## 2015-01-02 DIAGNOSIS — I509 Heart failure, unspecified: Secondary | ICD-10-CM | POA: Insufficient documentation

## 2015-01-02 DIAGNOSIS — F309 Manic episode, unspecified: Secondary | ICD-10-CM | POA: Insufficient documentation

## 2015-01-02 DIAGNOSIS — F1310 Sedative, hypnotic or anxiolytic abuse, uncomplicated: Secondary | ICD-10-CM | POA: Diagnosis not present

## 2015-01-02 DIAGNOSIS — Z720 Tobacco use: Secondary | ICD-10-CM | POA: Diagnosis not present

## 2015-01-02 DIAGNOSIS — R000 Tachycardia, unspecified: Secondary | ICD-10-CM | POA: Diagnosis not present

## 2015-01-02 LAB — COMPREHENSIVE METABOLIC PANEL
ALBUMIN: 5.4 g/dL — AB (ref 3.5–5.0)
ALK PHOS: 62 U/L (ref 38–126)
ALT: 18 U/L (ref 14–54)
ANION GAP: 12 (ref 5–15)
AST: 23 U/L (ref 15–41)
BUN: 10 mg/dL (ref 6–20)
CALCIUM: 10.5 mg/dL — AB (ref 8.9–10.3)
CO2: 25 mmol/L (ref 22–32)
CREATININE: 0.92 mg/dL (ref 0.44–1.00)
Chloride: 98 mmol/L — ABNORMAL LOW (ref 101–111)
GFR calc non Af Amer: >60 mL/min (ref >60)
Glucose, Bld: 78 mg/dL (ref 65–99)
Potassium: 4.1 mmol/L (ref 3.5–5.1)
SODIUM: 135 mmol/L (ref 135–145)
Total Bilirubin: 0.7 mg/dL (ref 0.3–1.2)
Total Protein: 8.8 g/dL — ABNORMAL HIGH (ref 6.5–8.1)

## 2015-01-02 LAB — ACETAMINOPHEN LEVEL: Acetaminophen (Tylenol), Serum: <10 ug/mL — ABNORMAL LOW (ref 10–30)

## 2015-01-02 LAB — CBC
HCT: 42.3 % (ref 36.0–46.0)
Hemoglobin: 14 g/dL (ref 12.0–15.0)
MCH: 28.7 pg (ref 26.0–34.0)
MCHC: 33.1 g/dL (ref 30.0–36.0)
MCV: 86.7 fL (ref 78.0–100.0)
PLATELETS: 360 10*3/uL (ref 150–400)
RBC: 4.88 MIL/uL (ref 3.87–5.11)
RDW: 14.7 % (ref 11.5–15.5)
WBC: 11.1 10*3/uL — AB (ref 4.0–10.5)

## 2015-01-02 LAB — RAPID URINE DRUG SCREEN, HOSP PERFORMED
Amphetamines: NOT DETECTED
BENZODIAZEPINES: POSITIVE — AB
Barbiturates: NOT DETECTED
COCAINE: POSITIVE — AB
Opiates: NOT DETECTED
Tetrahydrocannabinol: NOT DETECTED

## 2015-01-02 LAB — ETHANOL: Alcohol, Ethyl (B): 14 mg/dL — ABNORMAL HIGH (ref <5)

## 2015-01-02 LAB — SALICYLATE LEVEL

## 2015-01-02 MED ORDER — ALUM & MAG HYDROXIDE-SIMETH 200-200-20 MG/5ML PO SUSP: 30.0000 mL | ORAL | Status: DC | PRN

## 2015-01-02 MED ORDER — HALOPERIDOL 5 MG PO TABS
5.0000 mg | ORAL_TABLET | Freq: Once | ORAL | Status: AC
  Administered 2015-01-02: 5 mg via ORAL
  Filled 2015-01-02: qty 1

## 2015-01-02 MED ORDER — ACETAMINOPHEN 325 MG PO TABS
650.0000 mg | ORAL_TABLET | ORAL | Status: DC | PRN
  Administered 2015-01-02: 650 mg via ORAL
  Filled 2015-01-02: qty 2

## 2015-01-02 MED ORDER — ONDANSETRON HCL 4 MG PO TABS: 4.0000 mg | ORAL_TABLET | Freq: Three times a day (TID) | ORAL | Status: DC | PRN

## 2015-01-02 MED ORDER — TEMAZEPAM 7.5 MG PO CAPS
7.5000 mg | ORAL_CAPSULE | Freq: Every evening | ORAL | Status: DC | PRN
  Administered 2015-01-02: 7.5 mg via ORAL
  Filled 2015-01-02: qty 1

## 2015-01-02 MED ORDER — ZOLPIDEM TARTRATE 5 MG PO TABS: 5.0000 mg | ORAL_TABLET | Freq: Every evening | ORAL | Status: DC | PRN

## 2015-01-02 MED ORDER — ZIPRASIDONE HCL 20 MG PO CAPS
60.0000 mg | ORAL_CAPSULE | Freq: Two times a day (BID) | ORAL | Status: DC
  Administered 2015-01-02: 60 mg via ORAL
  Filled 2015-01-02 (×2): qty 3

## 2015-01-02 MED ORDER — AMANTADINE HCL 100 MG PO CAPS
100.0000 mg | ORAL_CAPSULE | Freq: Two times a day (BID) | ORAL | Status: DC
  Administered 2015-01-02: 100 mg via ORAL
  Filled 2015-01-02 (×4): qty 1

## 2015-01-02 MED ORDER — HYDROXYZINE PAMOATE 25 MG PO CAPS
25.0000 mg | ORAL_CAPSULE | Freq: Three times a day (TID) | ORAL | Status: DC | PRN
  Administered 2015-01-02: 25 mg via ORAL
  Filled 2015-01-02 (×2): qty 1

## 2015-01-02 MED ORDER — DIPHENHYDRAMINE HCL 25 MG PO CAPS
25.0000 mg | ORAL_CAPSULE | Freq: Four times a day (QID) | ORAL | Status: DC | PRN
  Administered 2015-01-02: 25 mg via ORAL
  Filled 2015-01-02: qty 1

## 2015-01-02 MED ORDER — LORAZEPAM 0.5 MG PO TABS
1.0000 mg | ORAL_TABLET | Freq: Once | ORAL | Status: AC
  Administered 2015-01-02: 1 mg via ORAL
  Filled 2015-01-02: qty 2

## 2015-01-02 MED ORDER — NICOTINE 21 MG/24HR TD PT24
21.0000 mg | MEDICATED_PATCH | Freq: Every day | TRANSDERMAL | Status: DC
  Filled 2015-01-02: qty 1

## 2015-01-02 MED ORDER — NICOTINE POLACRILEX 2 MG MT GUM
2.0000 mg | CHEWING_GUM | OROMUCOSAL | Status: DC | PRN
  Administered 2015-01-02 (×2): 2 mg via ORAL
  Filled 2015-01-02 (×2): qty 1

## 2015-01-02 NOTE — ED Notes (Signed)
Patient walked back to the unit from triage.  When she arrived she started talking really loudly and demanding medications.  The aide who brought her back stated that she had a Bible with her that she wanted to keep in the room.  I went in to check it with security and a straight razor between the pages.  The Bible was removed from her room and placed in her locker with her other belongings.  She is currently in the bathroom cleaning the toilet and ranting.  She is obviously responding to internal stimuli.

## 2015-01-02 NOTE — ED Provider Notes (Signed)
CSN: [id number]     Arrival date & time 01/02/15  1404 History  This chart was scribed for Christine Helper, PA-C, working with Arby Barrette, MD by Elon Spanner, ED Scribe. This patient was seen in room WTR4/WLPT4 and the patient's care was started at 3:12 PM.   Chief Complaint  Patient presents with  . Medical Clearance   The history is provided by the patient. No language interpreter was used.   HPI Comments: Christine Cobb is a 39 y.o. female with a history of bipolar affective disorder who was brought in by Christine Cobb after requesting transport to the ED.  She states she is here today to get "ativan in the left leg and geodon in the right; I'm going to drink my coffee and eat my ham sandwich."  She was evaluated in the ED on 7/4 for manic behaviors.  She was admitted by [facility] for treatment of her bipolar affective disorder with episode of manic and psychotic symptoms.  She was released from yesterday when she expressed that she is ready to go home and that she is looking forward to her sons 17th birthday on 7/23.  She denies hallucinations, SI, HI.   Past Medical History  Diagnosis Date  . Sinus tachycardia   . HTN (hypertension)   . Arm pain   . Eye globe prosthesis   . CHF (congestive heart failure)   . Bipolar affective disorder, currently manic, mild   . Hyperthyroidism    Past Surgical History  Procedure Laterality Date  . Eye surgery    . Dilation and curettage of uterus     Family History  Problem Relation Age of Onset  . Hypertension Other   . Emphysema Other   . Asthma Son   . Diabetes Maternal Uncle   . Diabetes Paternal Grandmother    History  Substance Use Topics  . Smoking status: Current Every Day Smoker -- 1.00 packs/day    Types: Cigarettes    Last Attempt to Quit: 08/21/2013  . Smokeless tobacco: Never Used  . Alcohol Use: No   OB History    Gravida Para Term Preterm AB TAB SAB Ectopic Multiple Living   0 1   2     Review of Systems   Psychiatric/Behavioral: Negative for suicidal ideas and hallucinations.       No HI  All other systems reviewed and are negative.     Allergies  Amoxil; Trazodone and nefazodone; Ketorolac tromethamine; Prenatal; and Tegretol  Home Medications   Prior to Admission medications   Medication Sig Start Date End Date Taking? Authorizing Provider  amantadine (SYMMETREL) 100 MG capsule Take 1 capsule (100 mg total) by mouth 2 (two) times daily. Patient not taking: Reported on 01/02/2015 12/25/14   [name], NP  hydrOXYzine (VISTARIL) 25 MG capsule Take 1 capsule (25 mg total) by mouth 3 (three) times daily as needed for anxiety. Patient not taking: Reported on 01/02/2015 12/25/14   [name], NP  nicotine polacrilex (NICORETTE) 2 MG gum Take 1 each (2 mg total) by mouth as needed for smoking cessation. Patient not taking: Reported on 01/02/2015 12/25/14   [name], NP  temazepam (RESTORIL) 7.5 MG capsule Take 1 capsule (7.5 mg total) by mouth at bedtime as needed for sleep. Patient not taking: Reported on 01/02/2015 12/25/14   [name], NP  ziprasidone (GEODON) 60 MG capsule Take 1 capsule (60 mg total) by mouth 2 (two)  times daily with a meal. Patient not taking: Reported on 01/02/2015 12/25/14   [name], NP   BP 148/102 mmHg  Pulse 118  Temp(Src) 98.7 F (37.1 C) (Oral)  Resp 20  SpO2 100% Physical Exam  Constitutional: She is oriented to person, place, and time. She appears well-developed and well-nourished. No distress.  African-American female is sitting up right; talkative, chatting on the phone and eating [place] sandwich.    HENT:  Head: Normocephalic and atraumatic.  Eyes: Conjunctivae and EOM are normal.  Neck: Neck supple. No tracheal deviation present.  Cardiovascular: Normal rate.   Mild tachycardia.  No murmurs rubs or gallops.   Pulmonary/Chest: Effort normal. No respiratory distress.  Musculoskeletal: Normal range of motion.   Neurological: She is alert and oriented to person, place, and time.     Skin: Skin is warm and dry.  Skin with good turgors.   Psychiatric: Her affect is labile. Her speech is tangential. She is hyperactive. Thought content is delusional. Thought content is not paranoid. Cognition and memory are impaired. She expresses impulsivity. She expresses no homicidal and no suicidal ideation.  Nursing note and vitals reviewed.   ED Course  Procedures (including critical care time)  DIAGNOSTIC STUDIES: Oxygen Saturation is 100% on RA, normal by my interpretation.    COORDINATION OF CARE:  3:20 PM Discussed treatment plan with patient at bedside.  Patient acknowledges and agrees with plan.    Patient with history of manic episodes with psychosis recently discharged from behavioral health but contacted police to bring her back to the ER. Difficult to obtain history however patient appears manic and psychotic. She tested positive for cocaine. She will need to be managed further by TTS.  4:19 PM TTS went to evaluate pt but she was sleeping after receiving her daily scheduled geodon.  Therefore, I will cancel the TTS consult until pt is more alert.    Labs Review Labs Reviewed  ACETAMINOPHEN LEVEL - Abnormal; Notable for the following:    Acetaminophen (Tylenol), Serum <10 (*)    All other components within normal limits  CBC - Abnormal; Notable for the following:    WBC 11.1 (*)    All other components within normal limits  COMPREHENSIVE METABOLIC PANEL - Abnormal; Notable for the following:    Chloride 98 (*)    Calcium 10.5 (*)    Total Protein 8.8 (*)    Albumin 5.4 (*)    All other components within normal limits  ETHANOL - Abnormal; Notable for the following:    Alcohol, Ethyl (B) 14 (*)    All other components within normal limits  URINE RAPID DRUG SCREEN, HOSP PERFORMED - Abnormal; Notable for the following:    Cocaine POSITIVE (*)    Benzodiazepines POSITIVE (*)    All other  components within normal limits  SALICYLATE LEVEL    Imaging Review No results found.   EKG Interpretation None      MDM   Final diagnoses:  Manic state  Schizoaffective disorder, bipolar type    BP 148/102 mmHg  Pulse 118  Temp(Src) 98.7 F (37.1 C) (Oral)  Resp 20  SpO2 100%  I personally performed the services described in this documentation, which was scribed in my presence. The recorded information has been reviewed and is accurate.     Christine Helper, PA-C 01/02/15 1609  Christine Helper, PA-C 01/02/15 1620  Arby Barrette, MD 01/06/15 0930

## 2015-01-02 NOTE — BHH Counselor (Signed)
Contacted ED to ask for the consult to be removed because the Pt was just given Geodon.  Wolfgang Phoenix, [facility] Triage Specialist

## 2015-01-02 NOTE — ED Notes (Signed)
Bed: WBH43 Expected date:  Expected time:  Means of arrival:  Comments: TR4 

## 2015-01-02 NOTE — ED Notes (Signed)
Patient called police today and requested transport to ED, pt denies SI/HI/AVH, requests 1 shot of Geodon and 1 shot of Ativan and a bed in room 38, states she is here because [facility] at [facility]" increased her rent. 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 Pt unable to clarify further.

## 2015-01-02 NOTE — ED Notes (Signed)
Delay in lab draw, pt in bathroom 

## 2015-01-03 DIAGNOSIS — F250 Schizoaffective disorder, bipolar type: Secondary | ICD-10-CM | POA: Diagnosis not present

## 2015-01-03 MED ORDER — NICOTINE POLACRILEX 2 MG MT GUM: 2.0000 mg | CHEWING_GUM | OROMUCOSAL | Status: DC | PRN

## 2015-01-03 NOTE — BHH Suicide Risk Assessment (Cosign Needed)
Suicide Risk Assessment  Discharge Assessment   [facility] Discharge Suicide Risk Assessment   Demographic Factors:  Low socioeconomic status, Living alone and Unemployed  Total Time spent with patient: 20 minutes  Musculoskeletal: Strength & Muscle Tone: within normal limits Gait & Station: normal Patient leans: N/A  Psychiatric Specialty Exam:     Blood pressure 90/55, pulse 97, temperature 98.2 F (36.8 C), temperature source Oral, resp. rate 15, SpO2 100 %, not currently breastfeeding.There is no weight on file to calculate BMI.  General Appearance: Casual  Eye Contact::  Good  Speech:  Clear and Coherent and Pressured  Volume:  Increased  Mood:  Euphoric and mildly euphoric  Affect:  Congruent and Labile  Thought Process:  Coherent, Goal Directed, Intact and Tangential  Orientation:  Full (Time, Place, and Person)  Thought Content:  WDL  Suicidal Thoughts:  No  Homicidal Thoughts:  No  Memory:  Immediate;   Good Recent;   Fair Remote;   Fair  Judgement:  Fair  Insight:  Shallow  Psychomotor Activity:  Normal  Concentration:  Fair  Recall:  NA  Fund of Knowledge:Poor  Language: Good  Akathisia:  NA  Handed:  Right  AIMS (if indicated):     Assets:  Desire for Improvement Housing Social Support Transportation  ADL's:  Intact  Cognition: WNL        Has this patient used any form of tobacco in the last 30 days? (Cigarettes, Smokeless Tobacco, Cigars, and/or Pipes) No  Mental Status Per Nursing Assessment::   On Admission:     Current Mental Status by Physician: NA  Loss Factors: NA  Historical Factors: NA  Risk Reduction Factors:   Responsible for children under 30 years of age  Continued Clinical Symptoms:  Bipolar Disorder:   Mixed State Alcohol/Substance Abuse/Dependencies  Cognitive Features That Contribute To Risk:  Polarized thinking    Suicide Risk:  Minimal: No identifiable suicidal ideation.  Patients presenting with no risk factors but  with morbid ruminations; may be classified as minimal risk based on the severity of the depressive symptoms  Principal Problem: Bipolar affective disorder, current episode manic without psychotic symptoms Discharge Diagnoses:  Patient Active Problem List   Diagnosis Date Noted  . Bipolar affective disorder, current episode manic without psychotic symptoms [F31.10] 03/16/2014    Priority: High  . Schizoaffective disorder, bipolar type [F25.0]   . Cocaine abuse [F14.10] 12/31/2014  . Psychoses [F29]   . Bipolar disorder, current episode manic without psychotic features, severe [F31.13]   . Agitation [R45.1]   . Manic behavior [F30.10]   . Psychosis [F29]   . Hyperthyroidism [E05.90] 09/21/2013  . Marijuana abuse [F12.10] 04/12/2013  . History of CHF (congestive heart failure) [Z86.79] 02/01/2013  . Abscess of abdominal wall [L02.211] 10/18/2012  . HTN (hypertension) [I10] 04/08/2011  . Tachycardia [R00.0] 04/08/2011      Plan Of Care/Follow-up recommendations:  Activity:  as tolerated Diet:  Regular  Is patient on multiple antipsychotic therapies at discharge:  No   Has Patient had three or more failed trials of antipsychotic monotherapy by history:  No  Recommended Plan for Multiple Antipsychotic Therapies: NA    Neiman Roots C   PMHNP-BC 01/03/2015, 11:28 AM

## 2015-01-03 NOTE — Consult Note (Signed)
[facility] Face-to-Face Psychiatry Consult   Reason for Consult: Bipolar disorder, current episode manic without psychotic features, severe Referring Physician:  EDP Patient Identification: Christine Cobb MRN:  [id number] Principal Diagnosis:  Bipolar disorder, current episode manic without psychotic features, severe Diagnosis:   Patient Active Problem List   Diagnosis Date Noted  . Bipolar affective disorder, current episode manic with psychotic symptoms [F31.2] 03/16/2014    Priority: High  . Cocaine abuse [F14.10] 12/31/2014  . Psychoses [F29]   . Bipolar disorder, current episode manic without psychotic features, severe [F31.13]   . Agitation [R45.1]   . Manic behavior [F30.10]   . Psychosis [F29]   . Hyperthyroidism [E05.90] 09/21/2013  . Marijuana abuse [F12.10] 04/12/2013  . History of CHF (congestive heart failure) [Z86.79] 02/01/2013  . Abscess of abdominal wall [L02.211] 10/18/2012  . HTN (hypertension) [I10] 04/08/2011  . Tachycardia [R00.0] 04/08/2011    Total Time spent with patient: 45 minutes  Subjective:   Christine Cobb is a 39 y.o. female patient admitted with Bipolar disorder, current episode manic without psychotic features, severe.  HPI:  AA female was seen this am for requesting housing and an injection of Geodon, Benadryl and Ativan.  She requested a particular room in the ER on arrival yesterday.  Patient was discharged from the ER a day before her arrival yesterday and she did not take any of her discharged medications and have not followed up with [facility] yet.   Her UDS was positive for Cocaine and Benzodiazepine.  Patient, on arrival yesterday was manic, could not focus or stay still until she was medicated.   Patient repeatedly asked for injections of her medications stating that she prefers injections to oral medications.  This visit is the 3rd in 10 days.  Today she denied SI/HI/AVH.  Patient stated that she came to the ER for housing until her check is  available on August 3rd.  Patient constantly asked for Ham sandwich and coffee and she was given what she needed.  Patient was discharged home.  HPI Elements:   Location:  Schizoaffective disorder, Bipolar , manic . Quality:  moderate-severe. Severity:  severe-moderate. Timing:  Acute. Duration:  Chronic mental illness. Context:  Brought in by EMS at her request for Antipsychotic injections and housing..  Past Medical History:  Past Medical History  Diagnosis Date  . Sinus tachycardia   . HTN (hypertension)   . Arm pain   . Eye globe prosthesis   . CHF (congestive heart failure)   . Bipolar affective disorder, currently manic, mild   . Hyperthyroidism     Past Surgical History  Procedure Laterality Date  . Eye surgery    . Dilation and curettage of uterus     Family History:  Family History  Problem Relation Age of Onset  . Hypertension Other   . Emphysema Other   . Asthma Son   . Diabetes Maternal Uncle   . Diabetes Paternal Grandmother    Social History:  History  Alcohol Use No     History  Drug Use  . Yes  . Special: Marijuana    Comment: over a month since used marijuana    History   Social History  . Marital Status: Married    Spouse Name: N/A  . Number of Children: N/A  . Years of Education: N/A   Social History Main Topics  . Smoking status: Current Every Day Smoker -- 1.00 packs/day    Types: Cigarettes    Last Attempt  to Quit: 08/21/2013  . Smokeless tobacco: Never Used  . Alcohol Use: No  . Drug Use: Yes    Special: Marijuana     Comment: over a month since used marijuana  . Sexual Activity: Yes    Birth Control/ Protection: Injection   Other Topics Concern  . None   Social History Narrative   Additional Social History:                          Allergies:   Allergies  Allergen Reactions  . Amoxil [Amoxicillin] Anaphylaxis  . Trazodone And Nefazodone Anaphylaxis  . Ketorolac Tromethamine Hives and Swelling  . Prenatal  [B-Plex Plus] Nausea Only  . Tegretol [Carbamazepine] Other (See Comments)    Swelling, skin peeling, skin discoloration    Labs:  Results for orders placed or performed during the hospital encounter of 01/02/15 (from the past 48 hour(s))  Urine rapid drug screen (hosp performed)not at [facility]     Status: Abnormal   Collection Time: 01/02/15  2:45 PM  Result Value Ref Range   Opiates NONE DETECTED NONE DETECTED   Cocaine POSITIVE (A) NONE DETECTED   Benzodiazepines POSITIVE (A) NONE DETECTED   Amphetamines NONE DETECTED NONE DETECTED   Tetrahydrocannabinol NONE DETECTED NONE DETECTED   Barbiturates NONE DETECTED NONE DETECTED    Comment:        DRUG SCREEN FOR MEDICAL PURPOSES ONLY.  IF CONFIRMATION IS NEEDED FOR ANY PURPOSE, NOTIFY LAB WITHIN 5 DAYS.        LOWEST DETECTABLE LIMITS FOR URINE DRUG SCREEN Drug Class       Cutoff (ng/mL) Amphetamine      1000 Barbiturate      200 Benzodiazepine   131 Tricyclics       438 Opiates          300 Cocaine          300 THC              50   Acetaminophen level     Status: Abnormal   Collection Time: 01/02/15  3:01 PM  Result Value Ref Range   Acetaminophen (Tylenol), Serum <10 (L) 10 - 30 ug/mL    Comment:        THERAPEUTIC CONCENTRATIONS VARY SIGNIFICANTLY. A RANGE OF 10-30 ug/mL MAY BE AN EFFECTIVE CONCENTRATION FOR MANY PATIENTS. HOWEVER, SOME ARE BEST TREATED AT CONCENTRATIONS OUTSIDE THIS RANGE. ACETAMINOPHEN CONCENTRATIONS >150 ug/mL AT 4 HOURS AFTER INGESTION AND >50 ug/mL AT 12 HOURS AFTER INGESTION ARE OFTEN ASSOCIATED WITH TOXIC REACTIONS.   CBC     Status: Abnormal   Collection Time: 01/02/15  3:01 PM  Result Value Ref Range   WBC 11.1 (H) 4.0 - 10.5 K/uL   RBC 4.88 3.87 - 5.11 MIL/uL   Hemoglobin 14.0 12.0 - 15.0 g/dL   HCT 42.3 36.0 - 46.0 %   MCV 86.7 78.0 - 100.0 fL   MCH 28.7 26.0 - 34.0 pg   MCHC 33.1 30.0 - 36.0 g/dL   RDW 14.7 11.5 - 15.5 %   Platelets 360 150 - 400 K/uL  Comprehensive metabolic  panel     Status: Abnormal   Collection Time: 01/02/15  3:01 PM  Result Value Ref Range   Sodium 135 135 - 145 mmol/L   Potassium 4.1 3.5 - 5.1 mmol/L   Chloride 98 (L) 101 - 111 mmol/L   CO2 25 22 - 32 mmol/L   Glucose, Bld 78 65 -  99 mg/dL   BUN 10 6 - 20 mg/dL   Creatinine, Ser 0.92 0.44 - 1.00 mg/dL   Calcium 10.5 (H) 8.9 - 10.3 mg/dL   Total Protein 8.8 (H) 6.5 - 8.1 g/dL   Albumin 5.4 (H) 3.5 - 5.0 g/dL   AST 23 15 - 41 U/L   ALT 18 14 - 54 U/L   Alkaline Phosphatase 62 38 - 126 U/L   Total Bilirubin 0.7 0.3 - 1.2 mg/dL   GFR calc non Af Amer >60 >60 mL/min   GFR calc Af Amer >60 >60 mL/min    Comment: (NOTE) The eGFR has been calculated using the CKD EPI equation. This calculation has not been validated in all clinical situations. eGFR's persistently <60 mL/min signify possible Chronic Kidney Disease.    Anion gap 12 5 - 15  Ethanol (ETOH)     Status: Abnormal   Collection Time: 01/02/15  3:01 PM  Result Value Ref Range   Alcohol, Ethyl (B) 14 (H) <5 mg/dL    Comment:        LOWEST DETECTABLE LIMIT FOR SERUM ALCOHOL IS 5 mg/dL FOR MEDICAL PURPOSES ONLY   Salicylate level     Status: None   Collection Time: 01/02/15  3:01 PM  Result Value Ref Range   Salicylate Lvl <5.1 2.8 - 30.0 mg/dL    Vitals: Blood pressure 105/62, pulse 87, temperature 98.6 F (37 C), temperature source Oral, resp. rate 18, SpO2 100 %, not currently breastfeeding.  Risk to Self: Is patient at risk for suicide?: No Risk to Others:   Prior Inpatient Therapy:   Prior Outpatient Therapy:    Current Facility-Administered Medications  Medication Dose Route Frequency Provider Last Rate Last Dose  . acetaminophen (TYLENOL) tablet 650 mg  650 mg Oral Q4H PRN Domenic Moras, PA-C   650 mg at 01/02/15 1848  . alum & mag hydroxide-simeth (MAALOX/MYLANTA) 200-200-20 MG/5ML suspension 30 mL  30 mL Oral PRN Domenic Moras, PA-C      . amantadine (SYMMETREL) capsule 100 mg  100 mg Oral BID Domenic Moras, PA-C    100 mg at 01/02/15 2206  . diphenhydrAMINE (BENADRYL) capsule 25 mg  25 mg Oral Q6H PRN Delfin Gant, NP   25 mg at 01/02/15 1613  . hydrOXYzine (VISTARIL) capsule 25 mg  25 mg Oral TID PRN Domenic Moras, PA-C   25 mg at 01/02/15 2211  . nicotine polacrilex (NICORETTE) gum 2 mg  2 mg Oral Q2H PRN Delfin Gant, NP   2 mg at 01/02/15 2244  . ondansetron (ZOFRAN) tablet 4 mg  4 mg Oral Q8H PRN Domenic Moras, PA-C      . temazepam (RESTORIL) capsule 7.5 mg  7.5 mg Oral QHS PRN Domenic Moras, PA-C   7.5 mg at 01/02/15 2207  . ziprasidone (GEODON) capsule 60 mg  60 mg Oral BID WC Domenic Moras, PA-C   60 mg at 01/02/15 1840   Current Outpatient Prescriptions  Medication Sig Dispense Refill  . amantadine (SYMMETREL) 100 MG capsule Take 1 capsule (100 mg total) by mouth 2 (two) times daily. (Patient not taking: Reported on 01/02/2015) 60 capsule 0  . hydrOXYzine (VISTARIL) 25 MG capsule Take 1 capsule (25 mg total) by mouth 3 (three) times daily as needed for anxiety. (Patient not taking: Reported on 01/02/2015) 30 capsule 0  . nicotine polacrilex (NICORETTE) 2 MG gum Take 1 each (2 mg total) by mouth as needed for smoking cessation. (Patient not taking: Reported on  01/02/2015) 100 tablet 0  . nicotine polacrilex (NICORETTE) 2 MG gum Take 1 each (2 mg total) by mouth every 2 (two) hours as needed for smoking cessation. 100 tablet 0  . temazepam (RESTORIL) 7.5 MG capsule Take 1 capsule (7.5 mg total) by mouth at bedtime as needed for sleep. (Patient not taking: Reported on 01/02/2015) 30 capsule 0  . ziprasidone (GEODON) 60 MG capsule Take 1 capsule (60 mg total) by mouth 2 (two) times daily with a meal. (Patient not taking: Reported on 01/02/2015) 60 capsule 0    Musculoskeletal: Strength & Muscle Tone: within normal limits Gait & Station: normal Patient leans: N/A  Psychiatric Specialty Exam: Physical Exam  Review of Systems  Constitutional: Negative.   HENT: Negative.   Eyes: Negative.   Respiratory:  Negative.   Cardiovascular: Negative.   Gastrointestinal: Negative.   Genitourinary: Negative.   Musculoskeletal: Negative.   Skin: Negative.   Neurological: Negative.   Endo/Heme/Allergies: Negative.     Blood pressure 105/62, pulse 87, temperature 98.6 F (37 C), temperature source Oral, resp. rate 18, SpO2 100 %, not currently breastfeeding.There is no weight on file to calculate BMI.  General Appearance: Casual  Eye Contact::  Good  Speech:  Clear and Coherent and Pressured  Volume:  Increased  Mood:  Euphoric and mildly euphoric  Affect:  Congruent and Labile  Thought Process:  Coherent, Goal Directed, Intact and Tangential  Orientation:  Full (Time, Place, and Person)  Thought Content:  WDL  Suicidal Thoughts:  No  Homicidal Thoughts:  No  Memory:  Immediate;   Good Recent;   Fair Remote;   Fair  Judgement:  Fair  Insight:  Shallow  Psychomotor Activity:  Normal  Concentration:  Fair  Recall:  NA  Fund of Knowledge:Poor  Language: Good  Akathisia:  NA  Handed:  Right  AIMS (if indicated):     Assets:  Desire for Improvement Housing Social Support Transportation  ADL's:  Intact  Cognition: WNL  Sleep:      Medical Decision Making: Established Problem, Stable/Improving (1)   Plan:  Follow up with [facility] for your Mental healthcare today.  Disposition: Discharge home  Delfin Gant   PMHNP-BC  01/03/2015 9:25 AM

## 2015-01-03 NOTE — ED Notes (Signed)
Patient denies SI, HI and AVH at this time. Patient calm and cooperative at this time. Encouragement and support provided and safety maintain. Q 15 min safety checks remain in place.

## 2015-03-05 ENCOUNTER — Emergency Department (HOSPITAL_COMMUNITY)
Admission: EM | Admit: 2015-03-05 | Discharge: 2015-03-05 | Disposition: A | Discharge status: Home / Self Care | Payer: Medicare Other | Attending: Emergency Medicine | Admitting: Emergency Medicine

## 2015-03-05 ENCOUNTER — Encounter (HOSPITAL_COMMUNITY): Payer: Self-pay | Admitting: *Deleted

## 2015-03-05 ENCOUNTER — Emergency Department (HOSPITAL_COMMUNITY): Payer: Medicare Other

## 2015-03-05 DIAGNOSIS — Y92002 Bathroom of unspecified non-institutional (private) residence single-family (private) house as the place of occurrence of the external cause: Secondary | ICD-10-CM | POA: Insufficient documentation

## 2015-03-05 DIAGNOSIS — Z720 Tobacco use: Secondary | ICD-10-CM | POA: Insufficient documentation

## 2015-03-05 DIAGNOSIS — S0990XA Unspecified injury of head, initial encounter: Secondary | ICD-10-CM | POA: Diagnosis present

## 2015-03-05 DIAGNOSIS — S199XXA Unspecified injury of neck, initial encounter: Secondary | ICD-10-CM | POA: Diagnosis not present

## 2015-03-05 DIAGNOSIS — S098XXA Other specified injuries of head, initial encounter: Secondary | ICD-10-CM | POA: Diagnosis not present

## 2015-03-05 DIAGNOSIS — S0083XA Contusion of other part of head, initial encounter: Secondary | ICD-10-CM | POA: Diagnosis not present

## 2015-03-05 DIAGNOSIS — W01198A Fall on same level from slipping, tripping and stumbling with subsequent striking against other object, initial encounter: Secondary | ICD-10-CM | POA: Insufficient documentation

## 2015-03-05 DIAGNOSIS — R220 Localized swelling, mass and lump, head: Secondary | ICD-10-CM | POA: Diagnosis not present

## 2015-03-05 DIAGNOSIS — Z8639 Personal history of other endocrine, nutritional and metabolic disease: Secondary | ICD-10-CM | POA: Diagnosis not present

## 2015-03-05 DIAGNOSIS — I509 Heart failure, unspecified: Secondary | ICD-10-CM | POA: Diagnosis not present

## 2015-03-05 DIAGNOSIS — Y9389 Activity, other specified: Secondary | ICD-10-CM | POA: Insufficient documentation

## 2015-03-05 DIAGNOSIS — T07 Unspecified multiple injuries: Secondary | ICD-10-CM | POA: Diagnosis not present

## 2015-03-05 DIAGNOSIS — Y998 Other external cause status: Secondary | ICD-10-CM | POA: Insufficient documentation

## 2015-03-05 DIAGNOSIS — M542 Cervicalgia: Secondary | ICD-10-CM

## 2015-03-05 DIAGNOSIS — W19XXXA Unspecified fall, initial encounter: Secondary | ICD-10-CM

## 2015-03-05 DIAGNOSIS — F3111 Bipolar disorder, current episode manic without psychotic features, mild: Secondary | ICD-10-CM | POA: Insufficient documentation

## 2015-03-05 DIAGNOSIS — S0093XA Contusion of unspecified part of head, initial encounter: Secondary | ICD-10-CM

## 2015-03-05 DIAGNOSIS — I10 Essential (primary) hypertension: Secondary | ICD-10-CM | POA: Insufficient documentation

## 2015-03-05 DIAGNOSIS — Z970 Presence of artificial eye: Secondary | ICD-10-CM | POA: Diagnosis not present

## 2015-03-05 NOTE — Discharge Instructions (Signed)
Cervical Sprain °A cervical sprain is an injury in the neck in which the strong, fibrous tissues (ligaments) that connect your neck bones stretch or tear. Cervical sprains can range from mild to severe. Severe cervical sprains can cause the neck vertebrae to be unstable. This can lead to damage of the spinal cord and can result in serious nervous system problems. The amount of time it takes for a cervical sprain to get better depends on the cause and extent of the injury. Most cervical sprains heal in 1 to 3 weeks. °CAUSES  °Severe cervical sprains may be caused by:  °· Contact sport injuries (such as from football, rugby, wrestling, hockey, auto racing, gymnastics, diving, martial arts, or boxing).   °· Motor vehicle collisions.   °· Whiplash injuries. This is an injury from a sudden forward and backward whipping movement of the head and neck.  °· Falls.   °Mild cervical sprains may be caused by:  °· Being in an awkward position, such as while cradling a telephone between your ear and shoulder.   °· Sitting in a chair that does not offer proper support.   °· Working at a poorly designed computer station.   °· Looking up or down for long periods of time.   °SYMPTOMS  °· Pain, soreness, stiffness, or a burning sensation in the front, back, or sides of the neck. This discomfort may develop immediately after the injury or slowly, 24 hours or more after the injury.   °· Pain or tenderness directly in the middle of the back of the neck.   °· Shoulder or upper back pain.   °· Limited ability to move the neck.   °· Headache.   °· Dizziness.   °· Weakness, numbness, or tingling in the hands or arms.   °· Muscle spasms.   °· Difficulty swallowing or chewing.   °· Tenderness and swelling of the neck.   °DIAGNOSIS  °Most of the time your health care provider can diagnose a cervical sprain by taking your history and doing a physical exam. Your health care provider will ask about previous neck injuries and any known neck  problems, such as arthritis in the neck. X-rays may be taken to find out if there are any other problems, such as with the bones of the neck. Other tests, such as a CT scan or MRI, may also be needed.  °TREATMENT  °Treatment depends on the severity of the cervical sprain. Mild sprains can be treated with rest, keeping the neck in place (immobilization), and pain medicines. Severe cervical sprains are immediately immobilized. Further treatment is done to help with pain, muscle spasms, and other symptoms and may include: °· Medicines, such as pain relievers, numbing medicines, or muscle relaxants.   °· Physical therapy. This may involve stretching exercises, strengthening exercises, and posture training. Exercises and improved posture can help stabilize the neck, strengthen muscles, and help stop symptoms from returning.   °HOME CARE INSTRUCTIONS  °· Put ice on the injured area.   °¨ Put ice in a plastic bag.   °¨ Place a towel between your skin and the bag.   °¨ Leave the ice on for 15-20 minutes, 3-4 times a day.   °· If your injury was severe, you may have been given a cervical collar to wear. A cervical collar is a two-piece collar designed to keep your neck from moving while it heals. °¨ Do not remove the collar unless instructed by your health care provider. °¨ If you have long hair, keep it outside of the collar. °¨ Ask your health care provider before making any adjustments to your collar. Minor   adjustments may be required over time to improve comfort and reduce pressure on your chin or on the back of your head.  Ifyou are allowed to remove the collar for cleaning or bathing, follow your health care provider's instructions on how to do so safely.  Keep your collar clean by wiping it with mild soap and water and drying it completely. If the collar you have been given includes removable pads, remove them every 1-2 days and hand wash them with soap and water. Allow them to air dry. They should be completely  dry before you wear them in the collar.  If you are allowed to remove the collar for cleaning and bathing, wash and dry the skin of your neck. Check your skin for irritation or sores. If you see any, tell your health care provider.  Do not drive while wearing the collar.   Only take over-the-counter or prescription medicines for pain, discomfort, or fever as directed by your health care provider.   Keep all follow-up appointments as directed by your health care provider.   Keep all physical therapy appointments as directed by your health care provider.   Make any needed adjustments to your workstation to promote good posture.   Avoid positions and activities that make your symptoms worse.   Warm up and stretch before being active to help prevent problems.  SEEK MEDICAL CARE IF:   Your pain is not controlled with medicine.   You are unable to decrease your pain medicine over time as planned.   Your activity level is not improving as expected.  SEEK IMMEDIATE MEDICAL CARE IF:   You develop any bleeding.  You develop stomach upset.  You have signs of an allergic reaction to your medicine.   Your symptoms get worse.   You develop new, unexplained symptoms.   You have numbness, tingling, weakness, or paralysis in any part of your body.  MAKE SURE YOU:   Understand these instructions.  Will watch your condition.  Will get help right away if you are not doing well or get worse. Document Released: 04/11/2007 Document Revised: 06/19/2013 Document Reviewed: 12/20/2012 [facility] Patient Information 2015 [place], [place]. This information is not intended to replace advice given to you by your health care provider. Make sure you discuss any questions you have with your health care provider.    X-rays of head and neck were normal. Tylenol for pain.

## 2015-03-05 NOTE — ED Provider Notes (Signed)
CSN: [id number]     Arrival date & time 03/05/15  0930 History   First MD Initiated Contact with Patient 03/05/15 [phone]     Chief Complaint  Patient presents with  . Fall    hematoma - forehead      (Consider location/radiation/quality/duration/timing/severity/associated sxs/prior Treatment) HPI.... Level V caveat for psychiatric disorder. Patient has been incarcerated. She apparently fell in the bathroom and hit her forehead.  She now complains of headache and neck pain. No neurological deficits. No truncal or extremity injury. Patient is screaming obscenities.  Past Medical History  Diagnosis Date  . Sinus tachycardia   . HTN (hypertension)   . Arm pain   . Eye globe prosthesis   . CHF (congestive heart failure)   . Bipolar affective disorder, currently manic, mild   . Hyperthyroidism    Past Surgical History  Procedure Laterality Date  . Eye surgery    . Dilation and curettage of uterus     Family History  Problem Relation Age of Onset  . Hypertension Other   . Emphysema Other   . Asthma Son   . Diabetes Maternal Uncle   . Diabetes Paternal Grandmother    Social History  Substance Use Topics  . Smoking status: Current Every Day Smoker -- 1.00 packs/day    Types: Cigarettes    Last Attempt to Quit: 08/21/2013  . Smokeless tobacco: Never Used  . Alcohol Use: No   OB History    Gravida Para Term Preterm AB TAB SAB Ectopic Multiple Living   3 2 2  1  0 1   2     Review of Systems  Unable to perform ROS: Psychiatric disorder      Allergies  Amoxil; Trazodone and nefazodone; Ketorolac tromethamine; Prenatal; and Tegretol  Home Medications   Prior to Admission medications   Medication Sig Start Date End Date Taking? Authorizing Provider  amantadine (SYMMETREL) 100 MG capsule Take 1 capsule (100 mg total) by mouth 2 (two) times daily. Patient not taking: Reported on 01/02/2015 12/25/14   [name], NP  hydrOXYzine (VISTARIL) 25 MG capsule Take 1 capsule  (25 mg total) by mouth 3 (three) times daily as needed for anxiety. Patient not taking: Reported on 01/02/2015 12/25/14   [name], NP  nicotine polacrilex (NICORETTE) 2 MG gum Take 1 each (2 mg total) by mouth as needed for smoking cessation. Patient not taking: Reported on 01/02/2015 12/25/14   [name], NP  nicotine polacrilex (NICORETTE) 2 MG gum Take 1 each (2 mg total) by mouth every 2 (two) hours as needed for smoking cessation. Patient not taking: Reported on 03/05/2015 01/03/15   [name], NP  temazepam (RESTORIL) 7.5 MG capsule Take 1 capsule (7.5 mg total) by mouth at bedtime as needed for sleep. Patient not taking: Reported on 01/02/2015 12/25/14   [name], NP  ziprasidone (GEODON) 60 MG capsule Take 1 capsule (60 mg total) by mouth 2 (two) times daily with a meal. Patient not taking: Reported on 01/02/2015 12/25/14   [name], NP   BP 138/101 mmHg  Pulse 116  Temp(Src) 98.6 F (37 C) (Oral)  Resp 20  SpO2 100%  LMP  Physical Exam  Constitutional: She is oriented to person, place, and time.  Pressured speech  HENT:  Head: Normocephalic.  Minimal forehead hematoma  Eyes: Conjunctivae and EOM are normal. Pupils are equal, round, and reactive to light.  Neck: Normal range of motion. Neck supple.  Tender to palpation  Cardiovascular: Normal rate and regular rhythm.   Pulmonary/Chest: Effort normal and breath sounds normal.  Abdominal: Soft. Bowel sounds are normal.  Musculoskeletal: Normal range of motion.  Neurological: She is alert and oriented to person, place, and time.  Skin: Skin is warm and dry.  Psychiatric:  Flight of ideas,  Tangential thinking  Nursing note and vitals reviewed.   ED Course  Procedures (including critical care time) Labs Review Labs Reviewed - No data to display  Imaging Review Ct Head Wo Contrast  03/05/2015   CLINICAL DATA:  Fall, hit forehead  EXAM: CT HEAD WITHOUT CONTRAST  CT CERVICAL SPINE WITHOUT  CONTRAST  TECHNIQUE: Multidetector CT imaging of the head and cervical spine was performed following the standard protocol without intravenous contrast. Multiplanar CT image reconstructions of the cervical spine were also generated.  COMPARISON:  None.  FINDINGS: CT HEAD FINDINGS  No skull fracture is noted. There is left eye prosthesis. No intracranial hemorrhage, mass effect or midline shift. No hydrocephalus. No acute cortical infarction. No mass lesion is noted on this unenhanced scan. The gray and white-matter differentiation is preserved. There is minimal scalp swelling in right frontal region see axial image 13.  CT CERVICAL SPINE FINDINGS  Axial images of the cervical spine shows no acute fracture or subluxation. Computer processed images shows no acute fracture or subluxation. Alignment, disc spaces and vertebral body heights are preserved. No prevertebral soft tissue swelling. Cervical airway is patent.  There is no pneumothorax in visualized lung apices.  IMPRESSION: 1. No acute intracranial abnormality. Minimal focal scalp swelling in right frontal region. 2. No cervical spine acute fracture or subluxation.   Electronically Signed   By: Natasha Mead M.D.   On: 03/05/2015 10:38   Ct Cervical Spine Wo Contrast  03/05/2015   CLINICAL DATA:  Fall, hit forehead  EXAM: CT HEAD WITHOUT CONTRAST  CT CERVICAL SPINE WITHOUT CONTRAST  TECHNIQUE: Multidetector CT imaging of the head and cervical spine was performed following the standard protocol without intravenous contrast. Multiplanar CT image reconstructions of the cervical spine were also generated.  COMPARISON:  None.  FINDINGS: CT HEAD FINDINGS  No skull fracture is noted. There is left eye prosthesis. No intracranial hemorrhage, mass effect or midline shift. No hydrocephalus. No acute cortical infarction. No mass lesion is noted on this unenhanced scan. The gray and white-matter differentiation is preserved. There is minimal scalp swelling in right frontal  region see axial image 13.  CT CERVICAL SPINE FINDINGS  Axial images of the cervical spine shows no acute fracture or subluxation. Computer processed images shows no acute fracture or subluxation. Alignment, disc spaces and vertebral body heights are preserved. No prevertebral soft tissue swelling. Cervical airway is patent.  There is no pneumothorax in visualized lung apices.  IMPRESSION: 1. No acute intracranial abnormality. Minimal focal scalp swelling in right frontal region. 2. No cervical spine acute fracture or subluxation.   Electronically Signed   By: Natasha Mead M.D.   On: 03/05/2015 10:38   I have personally reviewed and evaluated these images and lab results as part of my medical decision-making.   EKG Interpretation None      MDM   Final diagnoses:  Fall, initial encounter  Head contusion, initial encounter  Neck pain    CT head and cervical spine negative. No other obvious injuries. Will [facility] back to jail    Donnetta Hutching, MD 03/05/15 1059

## 2015-03-05 NOTE — ED Notes (Signed)
Bed: DJ49 Expected date: 03/05/15 Expected time: 9:22 AM Means of arrival: Ambulance Comments: EMS; fall/head injury; restrained

## 2015-03-05 NOTE — ED Notes (Signed)
[facility]  Found naked with feet in toilet.  Jailer turned and heard a thump Pt fell and hit forehead on metal frame of bed.   Pt is shackled upper and lower and currently lower is shackled to stretcher. Pt is screaming obscenities.   2 deputies at bedside.

## 2015-03-05 NOTE — ED Notes (Signed)
Pt discharged.  Waiting on transportation.

## 2015-04-02 ENCOUNTER — Emergency Department (HOSPITAL_COMMUNITY)
Admission: EM | Admit: 2015-04-02 | Discharge: 2015-04-03 | Payer: Medicare Other | Attending: Emergency Medicine | Admitting: Emergency Medicine

## 2015-04-02 ENCOUNTER — Encounter (HOSPITAL_COMMUNITY): Payer: Self-pay | Admitting: *Deleted

## 2015-04-02 DIAGNOSIS — I509 Heart failure, unspecified: Secondary | ICD-10-CM | POA: Insufficient documentation

## 2015-04-02 DIAGNOSIS — I10 Essential (primary) hypertension: Secondary | ICD-10-CM | POA: Insufficient documentation

## 2015-04-02 DIAGNOSIS — Z8639 Personal history of other endocrine, nutritional and metabolic disease: Secondary | ICD-10-CM | POA: Insufficient documentation

## 2015-04-02 DIAGNOSIS — Z720 Tobacco use: Secondary | ICD-10-CM | POA: Insufficient documentation

## 2015-04-02 DIAGNOSIS — Z970 Presence of artificial eye: Secondary | ICD-10-CM | POA: Diagnosis not present

## 2015-04-02 DIAGNOSIS — Z3202 Encounter for pregnancy test, result negative: Secondary | ICD-10-CM | POA: Insufficient documentation

## 2015-04-02 DIAGNOSIS — F250 Schizoaffective disorder, bipolar type: Secondary | ICD-10-CM

## 2015-04-02 DIAGNOSIS — F29 Unspecified psychosis not due to a substance or known physiological condition: Secondary | ICD-10-CM | POA: Diagnosis present

## 2015-04-02 LAB — COMPREHENSIVE METABOLIC PANEL
ALT: 22 U/L (ref 14–54)
ANION GAP: 7 (ref 5–15)
AST: 52 U/L — ABNORMAL HIGH (ref 15–41)
Albumin: 4 g/dL (ref 3.5–5.0)
Alkaline Phosphatase: 78 U/L (ref 38–126)
BUN: 17 mg/dL (ref 6–20)
CHLORIDE: 105 mmol/L (ref 101–111)
CO2: 25 mmol/L (ref 22–32)
Calcium: 9.1 mg/dL (ref 8.9–10.3)
Creatinine, Ser: 1.16 mg/dL — ABNORMAL HIGH (ref 0.44–1.00)
GFR calc non Af Amer: 59 mL/min — ABNORMAL LOW (ref >60)
Glucose, Bld: 77 mg/dL (ref 65–99)
Potassium: 3.8 mmol/L (ref 3.5–5.1)
SODIUM: 137 mmol/L (ref 135–145)
Total Bilirubin: 0.3 mg/dL (ref 0.3–1.2)
Total Protein: 7.6 g/dL (ref 6.5–8.1)

## 2015-04-02 LAB — CBC
HCT: 38.7 % (ref 36.0–46.0)
Hemoglobin: 12.7 g/dL (ref 12.0–15.0)
MCH: 28.3 pg (ref 26.0–34.0)
MCHC: 32.8 g/dL (ref 30.0–36.0)
MCV: 86.4 fL (ref 78.0–100.0)
PLATELETS: 295 10*3/uL (ref 150–400)
RBC: 4.48 MIL/uL (ref 3.87–5.11)
RDW: 14.9 % (ref 11.5–15.5)
WBC: 7.7 10*3/uL (ref 4.0–10.5)

## 2015-04-02 LAB — ACETAMINOPHEN LEVEL: Acetaminophen (Tylenol), Serum: <10 ug/mL — ABNORMAL LOW (ref 10–30)

## 2015-04-02 LAB — ETHANOL

## 2015-04-02 LAB — I-STAT BETA HCG BLOOD, ED (MC, WL, AP ONLY)

## 2015-04-02 LAB — SALICYLATE LEVEL

## 2015-04-02 MED ORDER — ZIPRASIDONE MESYLATE 20 MG IM SOLR
20.0000 mg | Freq: Once | INTRAMUSCULAR | Status: AC
  Administered 2015-04-02: 20 mg via INTRAMUSCULAR
  Filled 2015-04-02: qty 20

## 2015-04-02 MED ORDER — STERILE WATER FOR INJECTION IJ SOLN
INTRAMUSCULAR | Status: AC
  Administered 2015-04-02: 10 mL
  Filled 2015-04-02: qty 10

## 2015-04-02 MED ORDER — LORAZEPAM 2 MG/ML IJ SOLN
2.0000 mg | Freq: Once | INTRAMUSCULAR | Status: AC
  Administered 2015-04-02: 2 mg via INTRAMUSCULAR
  Filled 2015-04-02: qty 1

## 2015-04-02 NOTE — ED Notes (Signed)
TTS Marcus at bedside to eval pt prior to RN eval.

## 2015-04-02 NOTE — ED Provider Notes (Signed)
CSN: [id number]     Arrival date & time 04/02/15  1842 History   First MD Initiated Contact with Patient 04/02/15 1937     Chief Complaint  Patient presents with  . Psychotic      (Consider location/radiation/quality/duration/timing/severity/associated sxs/prior Treatment) Patient is a 39 y.o. female presenting with mental health disorder. The history is provided by the patient and the police.  Mental Health Problem Presenting symptoms: aggressive behavior, agitation, delusional, disorganized speech and disorganized thought process   Patient accompanied by:  Law enforcement Degree of incapacity (severity):  Severe Onset quality:  Gradual Timing:  Constant Progression:  Worsening Context: drug abuse   Treatment compliance:  Untreated Relieved by:  Nothing Worsened by:  Nothing tried Ineffective treatments:  None tried Associated symptoms: distractible, euphoric mood and poor judgment   Risk factors: hx of mental illness     Past Medical History  Diagnosis Date  . Sinus tachycardia (HCC)   . HTN (hypertension)   . Arm pain   . Eye globe prosthesis   . CHF (congestive heart failure) (HCC)   . Bipolar affective disorder, currently manic, mild (HCC)   . Hyperthyroidism    Past Surgical History  Procedure Laterality Date  . Eye surgery    . Dilation and curettage of uterus     Family History  Problem Relation Age of Onset  . Hypertension Other   . Emphysema Other   . Asthma Son   . Diabetes Maternal Uncle   . Diabetes Paternal Grandmother    Social History  Substance Use Topics  . Smoking status: Current Every Day Smoker -- 1.00 packs/day    Types: Cigarettes    Last Attempt to Quit: 08/21/2013  . Smokeless tobacco: Never Used  . Alcohol Use: No   OB History    Gravida Para Term Preterm AB TAB SAB Ectopic Multiple Living   0 1   2     Review of Systems  Psychiatric/Behavioral: Positive for agitation.  All other systems reviewed and are  negative.     Allergies  Amoxil; Trazodone and nefazodone; Ketorolac tromethamine; Prenatal; and Tegretol  Home Medications   Prior to Admission medications   Medication Sig Start Date End Date Taking? Authorizing Provider  amantadine (SYMMETREL) 100 MG capsule Take 1 capsule (100 mg total) by mouth 2 (two) times daily. Patient not taking: Reported on 01/02/2015 12/25/14   [name], NP  hydrOXYzine (VISTARIL) 25 MG capsule Take 1 capsule (25 mg total) by mouth 3 (three) times daily as needed for anxiety. Patient not taking: Reported on 01/02/2015 12/25/14   [name], NP  nicotine polacrilex (NICORETTE) 2 MG gum Take 1 each (2 mg total) by mouth as needed for smoking cessation. Patient not taking: Reported on 01/02/2015 12/25/14   [name], NP  nicotine polacrilex (NICORETTE) 2 MG gum Take 1 each (2 mg total) by mouth every 2 (two) hours as needed for smoking cessation. Patient not taking: Reported on 03/05/2015 01/03/15   [name], NP  temazepam (RESTORIL) 7.5 MG capsule Take 1 capsule (7.5 mg total) by mouth at bedtime as needed for sleep. Patient not taking: Reported on 01/02/2015 12/25/14   [name], NP  ziprasidone (GEODON) 60 MG capsule Take 1 capsule (60 mg total) by mouth 2 (two) times daily with a meal. Patient not taking: Reported on 01/02/2015 12/25/14   [name], NP   BP 137/77 mmHg  Pulse 87  Temp(Src) 98.3 F (36.8 C) (Oral)  Resp 17  SpO2 100% Physical Exam  Constitutional: She is oriented to person, place, and time. She appears well-developed and well-nourished. No distress.  HENT:  Head: Normocephalic.  Eyes: Conjunctivae are normal.  Neck: Neck supple. No tracheal deviation present.  Cardiovascular: Normal rate and regular rhythm.   Pulmonary/Chest: Effort normal. No respiratory distress.  Abdominal: Soft. She exhibits no distension.  Neurological: She is alert and oriented to person, place, and time.  Skin: Skin is  warm and dry.  Psychiatric: She has a normal mood and affect. Her speech is rapid and/or pressured and tangential. She is hyperactive. Thought content is delusional (with multiple fixed delusions). She is inattentive.    ED Course  Procedures (including critical care time) Labs Review Labs Reviewed  COMPREHENSIVE METABOLIC PANEL - Abnormal; Notable for the following:    Creatinine, Ser 1.16 (*)    AST 52 (*)    GFR calc non Af Amer 59 (*)    All other components within normal limits  ACETAMINOPHEN LEVEL - Abnormal; Notable for the following:    Acetaminophen (Tylenol), Serum <10 (*)    All other components within normal limits  ETHANOL  SALICYLATE LEVEL  CBC  URINE RAPID DRUG SCREEN, HOSP PERFORMED  I-STAT BETA HCG BLOOD, ED ([facility], [facility], [facility] ONLY)    Imaging Review No results found. I have personally reviewed and evaluated these images and lab results as part of my medical decision-making.   EKG Interpretation None      MDM   Final diagnoses:  Schizoaffective disorder, bipolar type [facility])    39 y.o. female presents after multiple run-ins and public disruptions in police custody. Her behavior is disjointed, she is screaming scripture and appears frankly psychotic and unstable. Pt not safe for discharge, given geodon and ativan, IVC initiated pending psychiatric evaluation. Reportedly abusing cocaine today. MEDICALLY CLEAR FOR TRANSFER OR PSYCHIATRIC ADMISSION.     Lyndal Pulley, MD 04/03/15 [phone]

## 2015-04-02 NOTE — ED Notes (Addendum)
md knott at bedside  Pt screaming sciptures at md.

## 2015-04-02 NOTE — Progress Notes (Signed)
Patient was referred for IP psych treatment at: [facility] - per Helmut Muster, couple adult female beds open, fax referral. [facility] - per Annabelle Harman, beds available, fax referral for review. [facility] - per Lenny Pastel, fax referral for d/c tomorrow. [facility] - per intake, fax referral for the waitlist. [facility] - per French Ana, fax referral for review, 1 female geriatric and 2 adult female beds open tonight. [facility] - left voicemail. [facility] - per intake, fax referral for review.  At capacity: [facility] - per [facility] - per Ventura Bruns, [place] Disposition staff 04/02/2015 10:10 PM

## 2015-04-02 NOTE — BH Assessment (Addendum)
Tele Assessment Note   Christine Cobb is an 39 y.o. female.  -Clinician reviewed note from nurse Ferman Hamming.  Patient was brought in by [employer].  She was causing a disturbance at [employer] today, shoplifting multiple times today.  At 17:00 manager at [facility] called [employer] and said patient was causing a disturbance and "going crazy."  Patient reportedly told police she was schizophrenic and off her medications.  Told them she was using cocaine and they did find drug paraphenalia on her.  Pt told the nurse she was married to 10 men and 10 women and that she was pregnant with 4 babies.  Patient was singing loudly when nurse saw her and when Dr. Clydene Pugh saw her.  Patient requested ativan and geodon.  When this clinician saw patient she was eating.  She was difficult to understand at times.  She says that she is [redacted] weeks pregnant with four boys.  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  Pt says she has no SI, HI or A/V hallucinations.  Patient reports no current outpatient provider.  When asked about medications she takes she names off medications she was given while in jail.  She goes on to say she likes jail because "you get free room & board."  Patient does not know when the last time she was inpatient at a facility was but does say she has been to [facility] before.   Pt is unclear about her drug use.  She does say taht she drank some "MD 20/20" this morning.  She has not yet provided urine for UDS.  Dr. Clydene Pugh did initiate IVC papers.    -Clinician talked to Donell Sievert, PA.  Patient does meet inpatient criteria.  TTS to seek placement.  Diagnosis: Axis 1: 295.90 Schizophrenia Axis 2: Deferred Axis 3: See H & P Axis 4: financial problems, problems related to legal system/crime Axis 5 GAF: 24  Past Medical History:  Past Medical History  Diagnosis Date  . Sinus tachycardia (HCC)   . HTN (hypertension)   . Arm pain   . Eye globe  prosthesis   . CHF (congestive heart failure) (HCC)   . Bipolar affective disorder, currently manic, mild (HCC)   . Hyperthyroidism     Past Surgical History  Procedure Laterality Date  . Eye surgery    . Dilation and curettage of uterus      Family History:  Family History  Problem Relation Age of Onset  . Hypertension Other   . Emphysema Other   . Asthma Son   . Diabetes Maternal Uncle   . Diabetes Paternal Grandmother     Social History:  reports that she has been smoking Cigarettes.  She has been smoking about 1.00 pack per day. She has never used smokeless tobacco. She reports that she uses illicit drugs (Marijuana). She reports that she does not drink alcohol.  Additional Social History:  Alcohol / Drug Use Pain Medications: Pt not taking any prescribed meds. Prescriptions: Pt named medications that she took when she was in jail.  Unclear about when she was in jail. Over the Counter: Unknown. History of alcohol / drug use?: Yes Substance #1 Name of Substance 1: ETOH 1 - Age of First Use: 39 years of age 98 - Amount (size/oz): Unknown 1 - Frequency: Unknown 1 - Duration: On-going 1 - Last Use / Amount: Reports drinking MD 20/20 today but her BAL is <5  Substance #2 Name of Substance 2: Marijuana 2 - Age of First Use: 39 years of age 73 - Amount (size/oz): 2-3 blunts per day 2 - Frequency: Varies 2 - Duration: On-going 2 - Last Use / Amount: Unknown Substance #3 Name of Substance 3: Cocaine 3 - Age of First Use: 39 years of age 37 - Amount (size/oz): Unknown 3 - Frequency: Varies 3 - Duration: on-going 3 - Last Use / Amount: Told police she was using cocaine.  Pt unsure of last use.  CIWA: CIWA-Ar BP: 137/77 mmHg Pulse Rate: 87 COWS:    PATIENT STRENGTHS: (choose at least two) Average or above average intelligence Communication skills  Allergies:  Allergies  Allergen Reactions  . Amoxil [Amoxicillin] Anaphylaxis  . Trazodone And Nefazodone Anaphylaxis   . Ketorolac Tromethamine Hives and Swelling  . Prenatal [B-Plex Plus] Nausea Only  . Tegretol [Carbamazepine] Other (See Comments)    Swelling, skin peeling, skin discoloration    Home Medications:  (Not in a hospital admission)  OB/GYN Status:  No LMP recorded.  General Assessment Data [facility] of Assessment: [facility] TTS Assessment: In system Is this a Tele or Face-to-Face Assessment?: Face-to-Face Is this an Initial Assessment or a Re-assessment for this encounter?: Initial Assessment Marital status: Married (says husband is Earley Abide) Is patient pregnant?: No (Thinks she is pregnant with four boys and that she is 4 week) Pregnancy Status: No Living Arrangements: Other (Comment) (Pt was staying at [facility]) Can pt return to current living arrangement?: No Admission Status: Involuntary Is patient capable of signing voluntary admission?: No Referral Source: Other ([employer] brought her in.) Insurance type: [facility]     Crisis Care Plan Living Arrangements: Other (Comment) (Pt was staying at [facility]) Name of Psychiatrist: None Name of Therapist: None  Education Status Is patient currently in school?: No Highest grade of school patient has completed: Some college  Risk to self with the past 6 months Suicidal Ideation: No Has patient been a risk to self within the past 6 months prior to admission? : No Suicidal Intent: No Has patient had any suicidal intent within the past 6 months prior to admission? : No Is patient at risk for suicide?: No Suicidal Plan?: No Has patient had any suicidal plan within the past 6 months prior to admission? : No Access to Means: No What has been your use of drugs/alcohol within the last 12 months?: Cocaine, ETOH & THC Previous Attempts/Gestures: No How many times?: 0 Other Self Harm Risks: None Triggers for Past Attempts: None known Intentional Self Injurious Behavior: None Family Suicide History: No Recent stressful life event(s): Other  (Comment) (Homelessness) Persecutory voices/beliefs?: No Depression: No Depression Symptoms:  (Pt denies depressive symptoms) Substance abuse history and/or treatment for substance abuse?: Yes Suicide prevention information given to non-admitted patients: Not applicable  Risk to Others within the past 6 months Homicidal Ideation: No Does patient have any lifetime risk of violence toward others beyond the six months prior to admission? : No Thoughts of Harm to Others: No Current Homicidal Intent: No Current Homicidal Plan: No Access to Homicidal Means: No Identified Victim: No one History of harm to others?: No Assessment of Violence: None Noted Violent Behavior Description: Pt denies Does patient have access to weapons?: No Criminal Charges Pending?: Yes Describe Pending Criminal Charges: Trespassing Does patient have a court date: Yes Court Date: 04/03/15 (Also 10/11 she says) Is patient on probation?: No  Psychosis Hallucinations: None noted (Reports none) Delusions: Grandiose (Pregnant, is a  millionaire, plays football for [employer])  Mental Status Report Appearance/Hygiene: Disheveled, In scrubs Eye Contact: Fair Motor Activity: Freedom of movement Speech: Pressured, Incoherent Level of Consciousness: Alert Mood: Suspicious, Helpless, Irritable Affect: Irritable, Preoccupied Anxiety Level: Moderate Thought Processes: Irrelevant, Flight of Ideas Judgement: Unimpaired Orientation: Person, Place Obsessive Compulsive Thoughts/Behaviors: Moderate  Cognitive Functioning Concentration: Decreased Memory: Recent Impaired, Remote Impaired IQ: Average Insight: Poor Impulse Control: Poor Appetite: Good Weight Loss: 0 Weight Gain: 0 Sleep: No Change Total Hours of Sleep: 8 Vegetative Symptoms: None  ADLScreening [facility] Assessment Services) Patient's cognitive ability adequate to safely complete daily activities?: Yes Patient able to express need for assistance with ADLs?:  Yes Independently performs ADLs?: Yes (appropriate for developmental age)  Prior Inpatient Therapy Prior Inpatient Therapy: Yes Prior Therapy Dates: Unknown Prior Therapy Facilty/Provider([facility]): [facility] Reason for Treatment: Psychosis  Prior Outpatient Therapy Prior Outpatient Therapy: Yes Prior Therapy Dates: 2011 Prior Therapy Facilty/Provider(s): "[facility]" Reason for Treatment: med managment Does patient have an ACCT team?: No Does patient have Intensive In-House Services?  : No Does patient have [facility] services? : No Does patient have P4CC services?: No  ADL Screening (condition at time of admission) Patient's cognitive ability adequate to safely complete daily activities?: Yes Is the patient deaf or have difficulty hearing?: No Does the patient have difficulty seeing, even when wearing glasses/contacts?: Yes (Left eye globe prothesis) Does the patient have difficulty concentrating, remembering, or making decisions?: Yes Patient able to express need for assistance with ADLs?: Yes Does the patient have difficulty dressing or bathing?: No Independently performs ADLs?: Yes (appropriate for developmental age) Does the patient have difficulty walking or climbing stairs?: No Weakness of Legs: None Weakness of Arms/Hands: None       Abuse/Neglect Assessment (Assessment to be complete while patient is alone) Physical Abuse: Denies Verbal Abuse: Denies Sexual Abuse: Denies Exploitation of patient/patient's resources: Denies Self-Neglect: Denies     Merchant navy officer (For Healthcare) Does patient have an advance directive?: No Would patient like information on creating an advanced directive?: No - patient declined information    Additional Information 1:1 In Past 12 Months?: No CIRT Risk: No Elopement Risk: No Does patient have medical clearance?: Yes     Disposition:  Disposition Initial Assessment Completed for this Encounter: Yes Disposition of  Patient: Inpatient treatment program, Referred to Type of inpatient treatment program: Adult Patient referred to:  (Pt to be reviewed by PA)  Beatriz Stallion Ray 04/02/2015 8:53 PM

## 2015-04-02 NOTE — ED Notes (Signed)
Pt is awake and alert. Denies SI/HI/AVH at this time. Denies feelings of hopelessness. Denies pain. Pt states that she was brought in because "they thought I had been trespassing at the [facility]". Pt believes that she is pregnant with 4 boys at this time. Safety is maintained through q15 minute checks.

## 2015-04-02 NOTE — ED Notes (Signed)
Pt being IVC by doctor Clydene Pugh

## 2015-04-02 NOTE — ED Notes (Addendum)
[employer] reports that since 0900 pt has been causing disruption, [employer] today reported she stole from them several times today. [employer] has been called at least 5x today. At 1700 [facility] called [employer] saying she was causing a disturbance and was "going crazy".  Pt told [employer] that she is bipolar and schizophrenic and not taking her meds, using cocaine, and pt had drug paraphernalia on her. Drug paraphernalia is in [employer] possession.   When pt arrived to ED, she told rn that she was "married, to 10 men and 10 women, and that rn was one of her wives, that she is pregnant with 4 babies". Pt singing loudly. Pt reports left eye injury, was going to get a prosthetic but it was $5,000 and so instead she wears contacts. Pt requesting a "shot of ativan in her right leg, and geodon in her left leg".

## 2015-04-03 DIAGNOSIS — T434X6A Underdosing of butyrophenone and thiothixene neuroleptics, initial encounter: Secondary | ICD-10-CM | POA: Diagnosis present

## 2015-04-03 DIAGNOSIS — T424X6A Underdosing of benzodiazepines, initial encounter: Secondary | ICD-10-CM | POA: Diagnosis present

## 2015-04-03 DIAGNOSIS — H5442 Blindness, left eye, normal vision right eye: Secondary | ICD-10-CM | POA: Diagnosis present

## 2015-04-03 DIAGNOSIS — R000 Tachycardia, unspecified: Secondary | ICD-10-CM | POA: Diagnosis present

## 2015-04-03 DIAGNOSIS — M549 Dorsalgia, unspecified: Secondary | ICD-10-CM | POA: Diagnosis present

## 2015-04-03 DIAGNOSIS — I10 Essential (primary) hypertension: Secondary | ICD-10-CM | POA: Diagnosis present

## 2015-04-03 DIAGNOSIS — F209 Schizophrenia, unspecified: Secondary | ICD-10-CM | POA: Diagnosis not present

## 2015-04-03 DIAGNOSIS — E039 Hypothyroidism, unspecified: Secondary | ICD-10-CM | POA: Diagnosis present

## 2015-04-03 DIAGNOSIS — I509 Heart failure, unspecified: Secondary | ICD-10-CM | POA: Diagnosis present

## 2015-04-03 DIAGNOSIS — M79603 Pain in arm, unspecified: Secondary | ICD-10-CM | POA: Diagnosis present

## 2015-04-03 DIAGNOSIS — Z91128 Patient's intentional underdosing of medication regimen for other reason: Secondary | ICD-10-CM | POA: Diagnosis not present

## 2015-04-03 DIAGNOSIS — F250 Schizoaffective disorder, bipolar type: Secondary | ICD-10-CM | POA: Diagnosis not present

## 2015-04-03 NOTE — ED Notes (Signed)
Up to the bathroom 

## 2015-04-03 NOTE — ED Notes (Signed)
Pt eating breakfast, dr Ladona Ridgel and Julieanne Cotton NP into see

## 2015-04-03 NOTE — ED Notes (Signed)
Pt has been accepted to [facility]-dr [phone] report number-[address] [place] [address].  Sheriff to be contactedby TTS.

## 2015-04-03 NOTE — BH Assessment (Signed)
[facility] Assessment Progress Note   Clinician informed nurse Wille Celeste that patient had been accepted to [facility].  Clinician also called [facility] and left message about need for IVC transport to [facility].

## 2015-04-03 NOTE — ED Notes (Signed)
Sheriff will be here in approx 1 hr 

## 2015-04-03 NOTE — ED Notes (Signed)
Talking w/ dr Ladona Ridgel

## 2015-04-03 NOTE — ED Notes (Addendum)
Pt ambulatory w/ sheriff w/o difficulty to [facility].  IVC papers, EMTELA, transfer report, face sheet, MAR report, assessment notes, belongings sent w/ sheriff.

## 2015-04-03 NOTE — ED Notes (Signed)
Sheriff is here to transport 

## 2015-04-03 NOTE — ED Notes (Signed)
Pharmacy tech into see 

## 2015-04-03 NOTE — ED Notes (Signed)
On the phone 

## 2015-11-26 ENCOUNTER — Encounter (HOSPITAL_COMMUNITY): Payer: Self-pay | Admitting: Emergency Medicine

## 2015-11-26 ENCOUNTER — Emergency Department (HOSPITAL_COMMUNITY): Payer: Medicare Other

## 2015-11-26 ENCOUNTER — Emergency Department (HOSPITAL_COMMUNITY)
Admission: EM | Admit: 2015-11-26 | Discharge: 2015-11-26 | Disposition: A | Discharge status: Home / Self Care | Payer: Medicare Other | Attending: Emergency Medicine | Admitting: Emergency Medicine

## 2015-11-26 DIAGNOSIS — Y929 Unspecified place or not applicable: Secondary | ICD-10-CM | POA: Diagnosis not present

## 2015-11-26 DIAGNOSIS — I509 Heart failure, unspecified: Secondary | ICD-10-CM | POA: Diagnosis not present

## 2015-11-26 DIAGNOSIS — X58XXXA Exposure to other specified factors, initial encounter: Secondary | ICD-10-CM | POA: Insufficient documentation

## 2015-11-26 DIAGNOSIS — Z0389 Encounter for observation for other suspected diseases and conditions ruled out: Secondary | ICD-10-CM | POA: Diagnosis not present

## 2015-11-26 DIAGNOSIS — I110 Hypertensive heart disease with heart failure: Secondary | ICD-10-CM | POA: Insufficient documentation

## 2015-11-26 DIAGNOSIS — Z79899 Other long term (current) drug therapy: Secondary | ICD-10-CM | POA: Insufficient documentation

## 2015-11-26 DIAGNOSIS — F250 Schizoaffective disorder, bipolar type: Secondary | ICD-10-CM | POA: Diagnosis not present

## 2015-11-26 DIAGNOSIS — Y939 Activity, unspecified: Secondary | ICD-10-CM | POA: Diagnosis not present

## 2015-11-26 DIAGNOSIS — T192XXA Foreign body in vulva and vagina, initial encounter: Secondary | ICD-10-CM | POA: Diagnosis not present

## 2015-11-26 DIAGNOSIS — Y999 Unspecified external cause status: Secondary | ICD-10-CM | POA: Insufficient documentation

## 2015-11-26 DIAGNOSIS — F17210 Nicotine dependence, cigarettes, uncomplicated: Secondary | ICD-10-CM | POA: Diagnosis not present

## 2015-11-26 LAB — I-STAT BETA HCG BLOOD, ED (MC, WL, AP ONLY)

## 2015-11-26 NOTE — ED Notes (Signed)
Pt provided with paper pants; pt remains agitated; discharge paper work given to [facility]

## 2015-11-26 NOTE — ED Notes (Signed)
Pt transported to [facility] 

## 2015-11-26 NOTE — ED Notes (Signed)
Pt states she has scrapes on right knee and both feet; this nurse did not inspect injuries due to pt being physically aggressive

## 2015-11-26 NOTE — ED Provider Notes (Signed)
CSN: [id number]     Arrival date & time 11/26/15  2040 History   First MD Initiated Contact with Patient 11/26/15 2056     Chief Complaint  Patient presents with  . Foreign Body     (Consider location/radiation/quality/duration/timing/severity/associated sxs/prior Treatment) HPI Comments: Patient here after allegedly placing a vial crack cocaine in her vagina. Patient was arrested and [place] [facility] who then requested patient be transported here for foreign body removal. When patient got out of the police car she removed the cocaine which was in a plastic bag. She has a history of schizoaffective disorder as well as bipolar disorder and is not being very cooperative at this time. She denies any current use of alcohol or illicit drugs. She cannot explain How the crack cocaine got there. Denies any abdominal complaints.  Patient is a 40 y.o. female presenting with foreign body. The history is provided by the patient and the police.  Foreign Body   Past Medical History  Diagnosis Date  . Sinus tachycardia (HCC)   . HTN (hypertension)   . Arm pain   . Eye globe prosthesis   . CHF (congestive heart failure) (HCC)   . Bipolar affective disorder, currently manic, mild (HCC)   . Hyperthyroidism    Past Surgical History  Procedure Laterality Date  . Eye surgery    . Dilation and curettage of uterus     Family History  Problem Relation Age of Onset  . Hypertension Other   . Emphysema Other   . Asthma Son   . Diabetes Maternal Uncle   . Diabetes Paternal Grandmother    Social History  Substance Use Topics  . Smoking status: Current Every Day Smoker -- 1.00 packs/day    Types: Cigarettes    Last Attempt to Quit: 08/21/2013  . Smokeless tobacco: Never Used  . Alcohol Use: No   OB History    Gravida Para Term Preterm AB TAB SAB Ectopic Multiple Living   0 1   2     Review of Systems  Unable to perform ROS: Acuity of condition      Allergies  Amoxil;  Trazodone and nefazodone; Ketorolac tromethamine; Prenatal; and Tegretol  Home Medications   Prior to Admission medications   Medication Sig Start Date End Date Taking? Authorizing Provider  amantadine (SYMMETREL) 100 MG capsule Take 1 capsule (100 mg total) by mouth 2 (two) times daily. Patient not taking: Reported on 01/02/2015 12/25/14   [name], NP  hydrOXYzine (VISTARIL) 25 MG capsule Take 1 capsule (25 mg total) by mouth 3 (three) times daily as needed for anxiety. Patient not taking: Reported on 01/02/2015 12/25/14   [name], NP  nicotine polacrilex (NICORETTE) 2 MG gum Take 1 each (2 mg total) by mouth as needed for smoking cessation. Patient not taking: Reported on 01/02/2015 12/25/14   [name], NP  nicotine polacrilex (NICORETTE) 2 MG gum Take 1 each (2 mg total) by mouth every 2 (two) hours as needed for smoking cessation. Patient not taking: Reported on 03/05/2015 01/03/15   [name], NP  temazepam (RESTORIL) 7.5 MG capsule Take 1 capsule (7.5 mg total) by mouth at bedtime as needed for sleep. Patient not taking: Reported on 01/02/2015 12/25/14   [name], NP  ziprasidone (GEODON) 60 MG capsule Take 1 capsule (60 mg total) by mouth 2 (two) times daily with a meal. Patient not taking: Reported on 01/02/2015 12/25/14  [name], NP   BP 141/110 mmHg  Pulse 109  Temp(Src) 98.4 F (36.9 C) (Oral)  Resp 20  SpO2 100% Physical Exam  Constitutional: She is oriented to person, place, and time. She appears well-developed and well-nourished.  Non-toxic appearance. No distress.  HENT:  Head: Normocephalic and atraumatic.  Eyes: Conjunctivae, EOM and lids are normal. Pupils are equal, round, and reactive to light.  Neck: Normal range of motion. Neck supple. No tracheal deviation present. No thyroid mass present.  Cardiovascular: Normal rate, regular rhythm and normal heart sounds.  Exam reveals no gallop.   No murmur heard. Pulmonary/Chest:  Effort normal and breath sounds normal. No stridor. No respiratory distress. She has no decreased breath sounds. She has no wheezes. She has no rhonchi. She has no rales.  Abdominal: Soft. Normal appearance and bowel sounds are normal. She exhibits no distension. There is no tenderness. There is no rebound and no CVA tenderness.  Musculoskeletal: Normal range of motion. She exhibits no edema or tenderness.  Neurological: She is alert and oriented to person, place, and time. She has normal strength. No cranial nerve deficit or sensory deficit. GCS eye subscore is 4. GCS verbal subscore is 5. GCS motor subscore is 6.  Skin: Skin is warm and dry. No abrasion and no rash noted.  Psychiatric: She has a normal mood and affect. Her speech is normal and behavior is normal.  Nursing note and vitals reviewed.   ED Course  Procedures (including critical care time) Labs Review Labs Reviewed - No data to display  Imaging Review No results found. I have personally reviewed and evaluated these images and lab results as part of my medical decision-making.   EKG Interpretation None      MDM   Final diagnoses:  None    Agent not cooperative for a pelvic exam. Plain x-rays showed no evidence of foreign body. We discharged to police custody    Lorre Nick, MD 11/26/15 2154

## 2015-11-26 NOTE — ED Notes (Signed)
Pt BIB [employer] after being arrested for indecent exposure; pt removed a "crack pipe" from vagina per [employer] before arrival; pt entered [facility] shouting obscenities and exhibiting uncooperative behavior.

## 2015-11-26 NOTE — Discharge Instructions (Signed)
Schizoaffective Disorder  Schizoaffective disorder (ScAD) is a mental illness. It causes symptoms that are a mixture of schizophrenia (a psychotic disorder) and an affective (mood) disorder. The schizophrenic symptoms may include delusions, hallucinations, or odd behavior. The mood symptoms may be similar to major depression or bipolar disorder. ScAD may interfere with personal relationships or normal daily activities. People with ScAD are at increased risk for job loss, social isolation, physical health problems, anxiety and substance use disorders, and suicide.  ScAD usually occurs in cycles. Periods of severe symptoms are followed by periods of  less severe symptoms or improvement. The illness affects men and women equally but usually appears at an earlier age (teenage or early adult years) in men. People who have family members with schizophrenia, bipolar disorder, or ScAD are at higher risk of developing ScAD.  SYMPTOMS   At any one time, people with ScAD may have psychotic symptoms only or both psychotic and mood symptoms. The psychotic symptoms include one or more of the following:  · Hearing, seeing, or feeling things that are not there (hallucinations).    · Having fixed, false beliefs (delusions). The delusions usually are of being attacked, harassed, cheated, persecuted, or conspired against (paranoid delusions).  · Speaking in a way that makes no sense to others (disorganized speech).  The psychotic symptoms of ScAD may also include confusing or odd behavior or any of the negative symptoms of schizophrenia. These include loss of motivation for normal daily activities, such as bathing or grooming, withdrawal from other people, and lack of emotions.     The mood symptoms of ScAD occur more often than not. They resemble major depressive disorder or bipolar mania. Symptoms of major depression include depressed mood and four or more of the following:  · Loss of interest in usually pleasurable activities  (anhedonia).  · Sleeping more or less than normal.  · Feeling worthless or excessively guilty.  · Lack of energy or motivation.  · Trouble concentrating.  · Eating more or less than usual.  · Thinking a lot about death or suicide.  Symptoms of bipolar mania include abnormally elevated or irritable mood and increased energy or activity, plus three or more of the following:    · More confidence than normal or feeling that you are able to do anything (grandiosity).  · Feeling rested with less sleep than normal.    · Being easily distracted.    · Talking more than usual or feeling pressured to keep talking.    · Feeling that your thoughts are racing.  · Engaging in high-risk activities such as buying sprees or foolish business decisions.  DIAGNOSIS   ScAD is diagnosed through an assessment by your health care provider. Your health care provider will observe and ask questions about your thoughts, behavior, mood, and ability to function in daily life. Your health care provider may also ask questions about your medical history and use of drugs, including prescription medicines. Your health care provider may also order blood tests and imaging exams. Certain medical conditions and substances can cause symptoms that resemble ScAD. Your health care provider may refer you to a mental health specialist for evaluation.   ScAD is divided into two types. The depressive type is diagnosed if your mood symptoms are limited to major depression. The bipolar type is diagnosed if your mood symptoms are manic or a mixture of manic and depressive symptoms  TREATMENT   ScAD is usually a lifelong illness. Long-term treatment is necessary. The following treatments are available:  · Medicine. Different types of   medicine are used to treat ScAD. The exact combination depends on the type and severity of your symptoms. Antipsychotic medicine is used to control psychotic symptoms such as delusions, paranoia, and hallucinations. Mood stabilizers can  even the highs and lows of bipolar manic mood swings. Antidepressant medicines are used to treat major depressive symptoms.  · Counseling or talk therapy. Individual, group, or family counseling may be helpful in providing education, support, and guidance. Many people with ScAD also benefit from social skills and job skills (vocational) training.  A combination of medicine and counseling is usually best for managing the disorder over time. A procedure in which electricity is applied to the brain through the scalp (electroconvulsive therapy) may be used to treat people with severe manic symptoms that do not respond to medicine and counseling.  HOME CARE INSTRUCTIONS   · Take all your medicine as prescribed.  · Check with your health care provider before starting new prescription or over-the-counter medicines.  · Keep all follow up appointments with your health care provider.  SEEK MEDICAL CARE IF:   · If you are not able to take your medicines as prescribed.  · If your symptoms get worse.  SEEK IMMEDIATE MEDICAL CARE IF:   · You have serious thoughts about hurting yourself or others.     This information is not intended to replace advice given to you by your health care provider. Make sure you discuss any questions you have with your health care provider.     Document Released: 10/25/2006 Document Revised: 07/05/2014 Document Reviewed: 01/26/2013  Elsevier Interactive Patient Education ©2016 [employer]

## 2015-11-26 NOTE — ED Notes (Signed)
Bed: WA16 Expected date:  Expected time:  Means of arrival:  Comments: Pt in room 

## 2015-11-26 NOTE — ED Notes (Signed)
Pt screaming angrily; unknown who pt is screaming at; pt also tearful

## 2015-11-26 NOTE — ED Notes (Signed)
Pt urinated on bed; sheets changed, pt cleaned

## 2016-03-17 ENCOUNTER — Emergency Department (HOSPITAL_COMMUNITY)
Admission: EM | Admit: 2016-03-17 | Discharge: 2016-03-18 | Disposition: A | Discharge status: Home / Self Care | Payer: Medicare Other | Attending: Emergency Medicine | Admitting: Emergency Medicine

## 2016-03-17 DIAGNOSIS — F1010 Alcohol abuse, uncomplicated: Secondary | ICD-10-CM | POA: Insufficient documentation

## 2016-03-17 DIAGNOSIS — I110 Hypertensive heart disease with heart failure: Secondary | ICD-10-CM | POA: Diagnosis not present

## 2016-03-17 DIAGNOSIS — F17210 Nicotine dependence, cigarettes, uncomplicated: Secondary | ICD-10-CM | POA: Insufficient documentation

## 2016-03-17 DIAGNOSIS — F1410 Cocaine abuse, uncomplicated: Secondary | ICD-10-CM | POA: Insufficient documentation

## 2016-03-17 DIAGNOSIS — F29 Unspecified psychosis not due to a substance or known physiological condition: Secondary | ICD-10-CM | POA: Diagnosis not present

## 2016-03-17 DIAGNOSIS — Z046 Encounter for general psychiatric examination, requested by authority: Secondary | ICD-10-CM | POA: Diagnosis present

## 2016-03-17 DIAGNOSIS — F1910 Other psychoactive substance abuse, uncomplicated: Secondary | ICD-10-CM

## 2016-03-17 DIAGNOSIS — I509 Heart failure, unspecified: Secondary | ICD-10-CM | POA: Insufficient documentation

## 2016-03-17 DIAGNOSIS — Z79899 Other long term (current) drug therapy: Secondary | ICD-10-CM | POA: Diagnosis not present

## 2016-03-17 DIAGNOSIS — F0391 Unspecified dementia with behavioral disturbance: Secondary | ICD-10-CM | POA: Diagnosis not present

## 2016-03-17 LAB — CBC WITH DIFFERENTIAL/PLATELET
Basophils Absolute: 0.1 10*3/uL (ref 0.0–0.1)
Basophils Relative: 0 %
EOS PCT: 0 %
Eosinophils Absolute: 0 10*3/uL (ref 0.0–0.7)
HEMATOCRIT: 37.4 % (ref 36.0–46.0)
Hemoglobin: 12.4 g/dL (ref 12.0–15.0)
LYMPHS PCT: 15 %
Lymphs Abs: 2.1 10*3/uL (ref 0.7–4.0)
MCH: 28.4 pg (ref 26.0–34.0)
MCHC: 33.2 g/dL (ref 30.0–36.0)
MCV: 85.6 fL (ref 78.0–100.0)
MONO ABS: 0.9 10*3/uL (ref 0.1–1.0)
MONOS PCT: 7 %
NEUTROS ABS: 10.7 10*3/uL — AB (ref 1.7–7.7)
Neutrophils Relative %: 78 %
PLATELETS: 333 10*3/uL (ref 150–400)
RBC: 4.37 MIL/uL (ref 3.87–5.11)
RDW: 14.2 % (ref 11.5–15.5)
WBC: 13.9 10*3/uL — ABNORMAL HIGH (ref 4.0–10.5)

## 2016-03-17 LAB — COMPREHENSIVE METABOLIC PANEL
ALBUMIN: 4.5 g/dL (ref 3.5–5.0)
ALT: 13 U/L — AB (ref 14–54)
AST: 22 U/L (ref 15–41)
Alkaline Phosphatase: 66 U/L (ref 38–126)
Anion gap: 10 (ref 5–15)
BILIRUBIN TOTAL: 0.4 mg/dL (ref 0.3–1.2)
BUN: 19 mg/dL (ref 6–20)
CHLORIDE: 107 mmol/L (ref 101–111)
CO2: 21 mmol/L — ABNORMAL LOW (ref 22–32)
CREATININE: 1.02 mg/dL — AB (ref 0.44–1.00)
Calcium: 9.6 mg/dL (ref 8.9–10.3)
GFR calc Af Amer: >60 mL/min (ref >60)
GLUCOSE: 82 mg/dL (ref 65–99)
POTASSIUM: 3.9 mmol/L (ref 3.5–5.1)
Sodium: 138 mmol/L (ref 135–145)
TOTAL PROTEIN: 7.9 g/dL (ref 6.5–8.1)

## 2016-03-17 LAB — ETHANOL: Alcohol, Ethyl (B): <5 mg/dL (ref <5)

## 2016-03-17 MED ORDER — ZIPRASIDONE MESYLATE 20 MG IM SOLR
INTRAMUSCULAR | Status: AC
  Filled 2016-03-17: qty 20

## 2016-03-17 MED ORDER — ZIPRASIDONE MESYLATE 20 MG IM SOLR
20.0000 mg | Freq: Once | INTRAMUSCULAR | Status: AC
  Administered 2016-03-17: 20 mg via INTRAMUSCULAR

## 2016-03-17 MED ORDER — LORAZEPAM 2 MG/ML IJ SOLN
1.0000 mg | Freq: Once | INTRAMUSCULAR | Status: AC
Start: 2016-03-17 — End: 2016-03-17
  Administered 2016-03-17: 1 mg via INTRAMUSCULAR
  Filled 2016-03-17: qty 1

## 2016-03-17 MED ORDER — STERILE WATER FOR INJECTION IJ SOLN
INTRAMUSCULAR | Status: AC
  Administered 2016-03-17: 10 mL
  Filled 2016-03-17: qty 10

## 2016-03-17 NOTE — ED Triage Notes (Signed)
Per [facility], pt admits to use of cocaine & Smirnoff's.  No other complaints.

## 2016-03-17 NOTE — ED Provider Notes (Signed)
[facility]-EMERGENCY DEPT Provider Note   CSN: [id number] Arrival date & time: 03/17/16  1912     History   Chief Complaint Chief Complaint  Patient presents with  . Psychiatric Evaluation    cocaine & ETOH    HPI Kanyon [name] is a 40 y.o. female.  HPI Patient presents to the emergency room with acute agitation.  Patient has a history of psychiatric illness. She also has a history of crack and alcohol abuse. Patient states she's been drinking alcohol and using cocaine today. She was brought in by police in the EMS because she was acutely agitated. Patient denies any complaints of chest pain or shortness of breath.  She is singing loudly.  Pt laughing and overtly sexual. Past Medical History:  Diagnosis Date  . Arm pain   . Bipolar affective disorder, currently manic, mild (HCC)   . CHF (congestive heart failure) (HCC)   . Eye globe prosthesis   . HTN (hypertension)   . Hyperthyroidism   . Sinus tachycardia [facility])     Patient Active Problem List   Diagnosis Date Noted  . Schizoaffective disorder, bipolar type (HCC)   . Cocaine abuse 12/31/2014  . Psychoses   . Bipolar disorder, current episode manic without psychotic features, severe (HCC)   . Agitation   . Manic behavior (HCC)   . Psychosis   . Bipolar affective disorder, current episode manic without psychotic symptoms (HCC) 03/16/2014  . Hyperthyroidism 09/21/2013  . Marijuana abuse 04/12/2013  . History of CHF (congestive heart failure) 02/01/2013  . Abscess of abdominal wall 10/18/2012  . HTN (hypertension) 04/08/2011  . Tachycardia 04/08/2011    Past Surgical History:  Procedure Laterality Date  . DILATION AND CURETTAGE OF UTERUS    . EYE SURGERY      OB History    Gravida Para Term Preterm AB Living   4 2 2   1 2    SAB TAB Ectopic Multiple Live Births   1 0     2       Home Medications    Prior to Admission medications   Medication Sig Start Date End Date Taking? Authorizing Provider    amantadine (SYMMETREL) 100 MG capsule Take 1 capsule (100 mg total) by mouth 2 (two) times daily. Patient not taking: Reported on 01/02/2015 12/25/14   [name], NP  hydrOXYzine (VISTARIL) 25 MG capsule Take 1 capsule (25 mg total) by mouth 3 (three) times daily as needed for anxiety. Patient not taking: Reported on 01/02/2015 12/25/14   [name], NP  nicotine polacrilex (NICORETTE) 2 MG gum Take 1 each (2 mg total) by mouth as needed for smoking cessation. Patient not taking: Reported on 01/02/2015 12/25/14   [name], NP  nicotine polacrilex (NICORETTE) 2 MG gum Take 1 each (2 mg total) by mouth every 2 (two) hours as needed for smoking cessation. Patient not taking: Reported on 03/05/2015 01/03/15   [name], NP  temazepam (RESTORIL) 7.5 MG capsule Take 1 capsule (7.5 mg total) by mouth at bedtime as needed for sleep. Patient not taking: Reported on 01/02/2015 12/25/14   [name], NP  ziprasidone (GEODON) 60 MG capsule Take 1 capsule (60 mg total) by mouth 2 (two) times daily with a meal. Patient not taking: Reported on 01/02/2015 12/25/14   [name], NP    Family History Family History  Problem Relation Age of Onset  . Hypertension Other   . Emphysema Other   .  Asthma Son   . Diabetes Maternal Uncle   . Diabetes Paternal Grandmother     Social History Social History  Substance Use Topics  . Smoking status: Current Every Day Smoker    Packs/day: 1.00    Types: Cigarettes    Last attempt to quit: 08/21/2013  . Smokeless tobacco: Never Used  . Alcohol use No     Allergies   Amoxil [amoxicillin]; Trazodone and nefazodone; Ketorolac tromethamine; Prenatal [b-plex plus]; and Tegretol [carbamazepine]   Review of Systems Review of Systems  Constitutional: Negative for fever.  Cardiovascular: Negative for chest pain.  All other systems reviewed and are negative.    Physical Exam Updated Vital Signs BP 113/77   Pulse 104   Temp  98.9 F (37.2 C) (Oral)   Resp 18   Ht 5\' 6"  (1.676 m)   Wt 63.5 kg   LMP  (LMP Unknown) Comment: neg preg test 11-26-2015  SpO2 99%   BMI 22.60 kg/m   Physical Exam  Constitutional: No distress.  HENT:  Head: Normocephalic and atraumatic.  Right Ear: External ear normal.  Left Ear: External ear normal.  Eyes: Conjunctivae are normal. Right eye exhibits no discharge. Left eye exhibits no discharge. No scleral icterus.  Neck: Neck supple. No tracheal deviation present.  Cardiovascular: Normal rate, regular rhythm and intact distal pulses.   Pulmonary/Chest: Effort normal and breath sounds normal. No stridor. No respiratory distress. She has no wheezes. She has no rales.  Abdominal: Soft. Bowel sounds are normal. She exhibits no distension. There is no tenderness. There is no rebound and no guarding.  Musculoskeletal: She exhibits no edema or tenderness.  Neurological: She is alert. She has normal strength. No cranial nerve deficit (no facial droop, extraocular movements intact, no slurred speech) or sensory deficit. She exhibits normal muscle tone. She displays no seizure activity. Coordination normal.  Skin: Skin is warm and dry. No rash noted. She is not diaphoretic.  Psychiatric: Her affect is labile and inappropriate. Her speech is rapid and/or pressured and tangential. She is hyperactive. She expresses impulsivity and inappropriate judgment. She expresses no homicidal and no suicidal ideation.  Nursing note and vitals reviewed.    ED Treatments / Results  Labs (all labs ordered are listed, but only abnormal results are displayed) Labs Reviewed  COMPREHENSIVE METABOLIC PANEL - Abnormal; Notable for the following:       Result Value   CO2 21 (*)    Creatinine, Ser 1.02 (*)    ALT 13 (*)    All other components within normal limits  CBC WITH DIFFERENTIAL/PLATELET - Abnormal; Notable for the following:    WBC 13.9 (*)    Neutro Abs 10.7 (*)    All other components within  normal limits  ETHANOL  URINE RAPID DRUG SCREEN, HOSP PERFORMED    EKG  EKG Interpretation  Date/Time:  Wednesday March 17 2016 19:36:48 EDT Ventricular Rate:  117 PR Interval:    QRS Duration: 75 QT Interval:  362 QTC Calculation: 506 R Axis:   52 Text Interpretation:  Sinus tachycardia Borderline prolonged QT interval qt duration increased from last tracing Confirmed by Helia Haese  MD-J, Anees Vanecek ([id number]) on 03/17/2016 7:39:42 PM       Radiology No results found.  Procedures Procedures (including critical care time)  Medications Ordered in ED Medications  sterile water (preservative free) injection (10 mLs  Given 03/17/16 1942)  ziprasidone (GEODON) injection 20 mg (20 mg Intramuscular Given 03/17/16 1941)  LORazepam (ATIVAN) injection 1  mg (1 mg Intramuscular Given 03/17/16 1942)     Initial Impression / Assessment and Plan / ED Course  I have reviewed the triage vital signs and the nursing notes.  Pertinent labs & imaging results that were available during my care of the patient were reviewed by me and considered in my medical decision making (see chart for details).  Clinical Course  Comment By Time  Medically stable.  Suspect drug induced psychosis.  Pt is sleeping.  Will have her assessed once she wakes up. Linwood Dibbles, MD 09/20 2147  VSS are stable.  Pt is sleeping Linwood Dibbles, MD 09/20 2233    Will turn case over to oncoming MD  Final Clinical Impressions(s) / ED Diagnoses   Final diagnoses:  Polysubstance abuse      Linwood Dibbles, MD 03/17/16 2358

## 2016-03-17 NOTE — ED Triage Notes (Signed)
Called pt twice for triage vitals no answer.

## 2016-03-17 NOTE — ED Notes (Signed)
Bed: [facility]37 Expected date:  Expected time:  Means of arrival:  Comments: EMS-crack/ETOH

## 2016-03-18 LAB — RAPID URINE DRUG SCREEN, HOSP PERFORMED
Amphetamines: NOT DETECTED
BENZODIAZEPINES: NOT DETECTED
Barbiturates: NOT DETECTED
COCAINE: POSITIVE — AB
Opiates: NOT DETECTED
Tetrahydrocannabinol: NOT DETECTED
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
## 2016-03-25 ENCOUNTER — Encounter (HOSPITAL_COMMUNITY): Payer: Self-pay | Admitting: Emergency Medicine

## 2016-03-29 ENCOUNTER — Emergency Department (HOSPITAL_COMMUNITY)
Admission: EM | Admit: 2016-03-29 | Discharge: 2016-03-29 | Disposition: A | Discharge status: Home / Self Care | Payer: Medicare Other | Attending: Emergency Medicine | Admitting: Emergency Medicine

## 2016-03-29 ENCOUNTER — Encounter (HOSPITAL_COMMUNITY): Payer: Self-pay | Admitting: *Deleted

## 2016-03-29 DIAGNOSIS — Z79899 Other long term (current) drug therapy: Secondary | ICD-10-CM | POA: Insufficient documentation

## 2016-03-29 DIAGNOSIS — I110 Hypertensive heart disease with heart failure: Secondary | ICD-10-CM | POA: Diagnosis not present

## 2016-03-29 DIAGNOSIS — I509 Heart failure, unspecified: Secondary | ICD-10-CM | POA: Diagnosis not present

## 2016-03-29 DIAGNOSIS — F17210 Nicotine dependence, cigarettes, uncomplicated: Secondary | ICD-10-CM | POA: Insufficient documentation

## 2016-03-29 DIAGNOSIS — F1910 Other psychoactive substance abuse, uncomplicated: Secondary | ICD-10-CM

## 2016-03-29 DIAGNOSIS — F1410 Cocaine abuse, uncomplicated: Secondary | ICD-10-CM | POA: Diagnosis not present

## 2016-03-29 LAB — CBC WITH DIFFERENTIAL/PLATELET
Basophils Absolute: 0.1 10*3/uL (ref 0.0–0.1)
Basophils Relative: 1 %
EOS ABS: 0.1 10*3/uL (ref 0.0–0.7)
EOS PCT: 1 %
HCT: 36.7 % (ref 36.0–46.0)
Hemoglobin: 12.1 g/dL (ref 12.0–15.0)
LYMPHS ABS: 2.3 10*3/uL (ref 0.7–4.0)
Lymphocytes Relative: 26 %
MCH: 28.5 pg (ref 26.0–34.0)
MCHC: 33 g/dL (ref 30.0–36.0)
MCV: 86.4 fL (ref 78.0–100.0)
Monocytes Absolute: 0.7 10*3/uL (ref 0.1–1.0)
Monocytes Relative: 8 %
Neutro Abs: 5.6 10*3/uL (ref 1.7–7.7)
Neutrophils Relative %: 64 %
PLATELETS: 287 10*3/uL (ref 150–400)
RBC: 4.25 MIL/uL (ref 3.87–5.11)
RDW: 14.3 % (ref 11.5–15.5)
WBC: 8.7 10*3/uL (ref 4.0–10.5)

## 2016-03-29 LAB — URINE MICROSCOPIC-ADD ON

## 2016-03-29 LAB — URINALYSIS, ROUTINE W REFLEX MICROSCOPIC
Bilirubin Urine: NEGATIVE
GLUCOSE, UA: NEGATIVE mg/dL
Ketones, ur: NEGATIVE mg/dL
Nitrite: NEGATIVE
PH: 6 (ref 5.0–8.0)
PROTEIN: NEGATIVE mg/dL
SPECIFIC GRAVITY, URINE: 1.025 (ref 1.005–1.030)

## 2016-03-29 LAB — COMPREHENSIVE METABOLIC PANEL
ALK PHOS: 58 U/L (ref 38–126)
ALT: 15 U/L (ref 14–54)
AST: 21 U/L (ref 15–41)
Albumin: 4.1 g/dL (ref 3.5–5.0)
Anion gap: 9 (ref 5–15)
BILIRUBIN TOTAL: 0.6 mg/dL (ref 0.3–1.2)
BUN: 17 mg/dL (ref 6–20)
CALCIUM: 8.7 mg/dL — AB (ref 8.9–10.3)
CO2: 21 mmol/L — AB (ref 22–32)
CREATININE: 0.85 mg/dL (ref 0.44–1.00)
Chloride: 105 mmol/L (ref 101–111)
Glucose, Bld: 76 mg/dL (ref 65–99)
Potassium: 3.9 mmol/L (ref 3.5–5.1)
SODIUM: 135 mmol/L (ref 135–145)
TOTAL PROTEIN: 7.3 g/dL (ref 6.5–8.1)

## 2016-03-29 LAB — RAPID URINE DRUG SCREEN, HOSP PERFORMED
AMPHETAMINES: NOT DETECTED
BENZODIAZEPINES: NOT DETECTED
Barbiturates: NOT DETECTED
COCAINE: POSITIVE — AB
OPIATES: NOT DETECTED
Tetrahydrocannabinol: NOT DETECTED

## 2016-03-29 LAB — POC URINE PREG, ED: PREG TEST UR: NEGATIVE

## 2016-03-29 LAB — ETHANOL: Alcohol, Ethyl (B): <5 mg/dL (ref <5)

## 2016-03-29 MED ORDER — AMANTADINE HCL 100 MG PO CAPS: 100.0000 mg | ORAL_CAPSULE | Freq: Two times a day (BID) | ORAL | 0 refills | Status: DC

## 2016-03-29 MED ORDER — TEMAZEPAM 7.5 MG PO CAPS: 7.5000 mg | ORAL_CAPSULE | Freq: Every evening | ORAL | 0 refills | Status: DC | PRN

## 2016-03-29 MED ORDER — HYDROXYZINE PAMOATE 25 MG PO CAPS: 25.0000 mg | ORAL_CAPSULE | Freq: Three times a day (TID) | ORAL | 0 refills | Status: DC | PRN

## 2016-03-29 MED ORDER — ZIPRASIDONE HCL 60 MG PO CAPS: 60.0000 mg | ORAL_CAPSULE | Freq: Two times a day (BID) | ORAL | 0 refills | Status: DC

## 2016-03-29 MED ORDER — PROMETHAZINE HCL 25 MG PO TABS: 25.0000 mg | ORAL_TABLET | Freq: Once | ORAL | Status: DC

## 2016-03-29 NOTE — ED Provider Notes (Signed)
[facility]-EM[facility] DEPT Provider Note   CSN: [id number] Arrival date & time: 03/29/16  1027     History   Chief Complaint Chief Complaint  Patient presents with  . Psychiatric Evaluation    HPI Christine Cobb is a 40 y.o. female.    Patient presents to the emergency department requesting a sandwich.  She's also requesting Geodon.  She has a history of polysubstance abuse and a history of bipolar disorder.  She states she's out of her medications.  No homicidal or suicidal thoughts.  She states she needs prescriptions for her medications.  She also reports that she would like to sleep at this time.  Her history is limited because she rolls over and puts her head under the blanket.  Patient is here voluntarily   The history is provided by the patient.    Past Medical History:  Diagnosis Date  . Arm pain   . Bipolar affective disorder, currently manic, mild (HCC)   . CHF (congestive heart failure) (HCC)   . Eye globe prosthesis   . HTN (hypertension)   . Hyperthyroidism   . Sinus tachycardia     Patient Active Problem List   Diagnosis Date Noted  . Schizoaffective disorder, bipolar type (HCC)   . Cocaine abuse 12/31/2014  . Psychoses   . Bipolar disorder, current episode manic without psychotic features, severe (HCC)   . Agitation   . Manic behavior (HCC)   . Psychosis   . Bipolar affective disorder, current episode manic without psychotic symptoms (HCC) 03/16/2014  . Hyperthyroidism 09/21/2013  . Marijuana abuse 04/12/2013  . History of CHF (congestive heart failure) 02/01/2013  . Abscess of abdominal wall 10/18/2012  . HTN (hypertension) 04/08/2011  . Tachycardia 04/08/2011    Past Surgical History:  Procedure Laterality Date  . DILATION AND CURETTAGE OF UTERUS    . EYE SURGERY      OB History    Gravida Para Term Preterm AB Living   4 2 2   1 2    SAB TAB Ectopic Multiple Live Births   1 0     2       Home Medications    Prior to Admission  medications   Medication Sig Start Date End Date Taking? Authorizing Provider  amantadine (SYMMETREL) 100 MG capsule Take 1 capsule (100 mg total) by mouth 2 (two) times daily. Patient not taking: Reported on 01/02/2015 12/25/14   [name], NP  hydrOXYzine (VISTARIL) 25 MG capsule Take 1 capsule (25 mg total) by mouth 3 (three) times daily as needed for anxiety. Patient not taking: Reported on 01/02/2015 12/25/14   [name], NP  nicotine polacrilex (NICORETTE) 2 MG gum Take 1 each (2 mg total) by mouth as needed for smoking cessation. Patient not taking: Reported on 01/02/2015 12/25/14   [name], NP  nicotine polacrilex (NICORETTE) 2 MG gum Take 1 each (2 mg total) by mouth every 2 (two) hours as needed for smoking cessation. Patient not taking: Reported on 03/05/2015 01/03/15   [name], NP  temazepam (RESTORIL) 7.5 MG capsule Take 1 capsule (7.5 mg total) by mouth at bedtime as needed for sleep. Patient not taking: Reported on 01/02/2015 12/25/14   [name], NP  ziprasidone (GEODON) 60 MG capsule Take 1 capsule (60 mg total) by mouth 2 (two) times daily with a meal. Patient not taking: Reported on 01/02/2015 12/25/14   [name], NP    Family History Family History  Problem Relation Age of Onset  . Hypertension Other   . Emphysema Other   . Asthma Son   . Diabetes Maternal Uncle   . Diabetes Paternal Grandmother     Social History Social History  Substance Use Topics  . Smoking status: Current Every Day Smoker    Packs/day: 1.00    Types: Cigarettes    Last attempt to quit: 08/21/2013  . Smokeless tobacco: Never Used  . Alcohol use No     Allergies   Amoxil [amoxicillin]; Trazodone and nefazodone; Ketorolac tromethamine; Prenatal [b-plex plus]; and Tegretol [carbamazepine]   Review of Systems Review of Systems  All other systems reviewed and are negative.    Physical Exam Updated Vital Signs BP 108/76 (BP Location: Right Arm)    Pulse 92   Temp 98.5 F (36.9 C) (Oral)   Resp 18   Ht 5\' 6"  (1.676 m)   Wt 140 lb (63.5 kg)   LMP  (LMP Unknown) Comment: neg preg test 06/03/202017  SpO2 100%   BMI 22.60 kg/m   Physical Exam  Constitutional: She is oriented to person, place, and time. She appears well-developed and well-nourished.  HENT:  Head: Normocephalic.  Eyes: EOM are normal.  Neck: Normal range of motion.  Pulmonary/Chest: Effort normal.  Abdominal: She exhibits no distension.  Musculoskeletal: Normal range of motion.  Neurological: She is alert and oriented to person, place, and time.  Psychiatric: She has a normal mood and affect.  Nursing note and vitals reviewed.    ED Treatments / Results  Labs (all labs ordered are listed, but only abnormal results are displayed) Labs Reviewed  COMPREHENSIVE METABOLIC PANEL - Abnormal; Notable for the following:       Result Value   CO2 21 (*)    Calcium 8.7 (*)    All other components within normal limits  URINE RAPID DRUG SCREEN, HOSP PERFORMED - Abnormal; Notable for the following:    Cocaine POSITIVE (*)    All other components within normal limits  URINALYSIS, ROUTINE W REFLEX MICROSCOPIC (NOT AT [facility]) - Abnormal; Notable for the following:    Hgb urine dipstick LARGE (*)    Leukocytes, UA MODERATE (*)    All other components within normal limits  URINE MICROSCOPIC-ADD ON - Abnormal; Notable for the following:    Squamous Epithelial / LPF 6-30 (*)    Bacteria, UA MANY (*)    All other components within normal limits  URINE CULTURE  ETHANOL  CBC WITH DIFFERENTIAL/PLATELET  POC URINE PREG, ED    EKG  EKG Interpretation None       Radiology No results found.  Procedures Procedures (including critical care time)  Medications Ordered in ED Medications - No data to display   Initial Impression / Assessment and Plan / ED Course  I have reviewed the triage vital signs and the nursing notes.  Pertinent labs & imaging results that  were available during my care of the patient were reviewed by me and considered in my medical decision making (see chart for details).  Clinical Course    This is likely polysubstance-induced mood disorder.  I do not believe the patient is a threat to herself or others at this time.  I have [facility].  I've written her new prescriptions for all of her psychiatric medications.  I've asked that she fill these.  I believe the patient is stable for discharge from the emergency department.  I recommended abstaining from alcohol  and cocaine  Final Clinical Impressions(s) / ED Diagnoses   Final diagnoses:  None    New Prescriptions New Prescriptions   No medications on file     Azalia Bilis, MD 03/29/16 1231

## 2016-03-29 NOTE — ED Notes (Signed)
Bed: WA11 Expected date:  Expected time:  Means of arrival:  Comments: 

## 2016-03-29 NOTE — ED Notes (Signed)
Pt. Was given lunch tray. Pt. Refused to have vital signs taken and stated she is good.

## 2016-03-30 LAB — URINE CULTURE

## 2016-04-20 ENCOUNTER — Other Ambulatory Visit: Payer: Self-pay

## 2016-04-20 ENCOUNTER — Emergency Department (HOSPITAL_COMMUNITY)
Admission: EM | Admit: 2016-04-20 | Discharge: 2016-04-21 | Disposition: A | Discharge status: Home / Self Care | Payer: Medicaid Other | Attending: Emergency Medicine | Admitting: Emergency Medicine

## 2016-04-20 DIAGNOSIS — F3111 Bipolar disorder, current episode manic without psychotic features, mild: Secondary | ICD-10-CM | POA: Diagnosis not present

## 2016-04-20 DIAGNOSIS — Z5181 Encounter for therapeutic drug level monitoring: Secondary | ICD-10-CM | POA: Diagnosis not present

## 2016-04-20 DIAGNOSIS — F309 Manic episode, unspecified: Secondary | ICD-10-CM | POA: Diagnosis present

## 2016-04-20 LAB — COMPREHENSIVE METABOLIC PANEL
ALT: 14 U/L (ref 14–54)
AST: 16 U/L (ref 15–41)
Albumin: 4 g/dL (ref 3.5–5.0)
Alkaline Phosphatase: 52 U/L (ref 38–126)
Anion gap: 6 (ref 5–15)
BUN: 16 mg/dL (ref 6–20)
CO2: 22 mmol/L (ref 22–32)
Calcium: 8.6 mg/dL — ABNORMAL LOW (ref 8.9–10.3)
Chloride: 111 mmol/L (ref 101–111)
Creatinine, Ser: 0.93 mg/dL (ref 0.44–1.00)
GFR calc Af Amer: >60 mL/min (ref >60)
GFR calc non Af Amer: >60 mL/min (ref >60)
Glucose, Bld: 92 mg/dL (ref 65–99)
Potassium: 3.1 mmol/L — ABNORMAL LOW (ref 3.5–5.1)
Sodium: 139 mmol/L (ref 135–145)
Total Bilirubin: 0.7 mg/dL (ref 0.3–1.2)
Total Protein: 6.6 g/dL (ref 6.5–8.1)

## 2016-04-20 LAB — CBC WITH DIFFERENTIAL/PLATELET
Basophils Absolute: 0 10*3/uL (ref 0.0–0.1)
Basophils Relative: 1 %
Eosinophils Absolute: 0.1 10*3/uL (ref 0.0–0.7)
Eosinophils Relative: 1 %
HCT: 33.7 % — ABNORMAL LOW (ref 36.0–46.0)
Hemoglobin: 11.2 g/dL — ABNORMAL LOW (ref 12.0–15.0)
Lymphocytes Relative: 28 %
Lymphs Abs: 1.7 10*3/uL (ref 0.7–4.0)
MCH: 28.3 pg (ref 26.0–34.0)
MCHC: 33.2 g/dL (ref 30.0–36.0)
MCV: 85.1 fL (ref 78.0–100.0)
Monocytes Absolute: 0.6 10*3/uL (ref 0.1–1.0)
Monocytes Relative: 9 %
Neutro Abs: 3.7 10*3/uL (ref 1.7–7.7)
Neutrophils Relative %: 61 %
Platelets: 263 10*3/uL (ref 150–400)
RBC: 3.96 MIL/uL (ref 3.87–5.11)
RDW: 13.7 % (ref 11.5–15.5)
WBC: 6.1 10*3/uL (ref 4.0–10.5)

## 2016-04-20 LAB — ETHANOL: Alcohol, Ethyl (B): <5 mg/dL (ref <5)

## 2016-04-20 MED ORDER — IBUPROFEN 200 MG PO TABS: 600.0000 mg | ORAL_TABLET | Freq: Three times a day (TID) | ORAL | Status: DC | PRN

## 2016-04-20 MED ORDER — LORAZEPAM 2 MG/ML IJ SOLN
2.0000 mg | Freq: Once | INTRAMUSCULAR | Status: AC
  Administered 2016-04-20: 2 mg via INTRAMUSCULAR
  Filled 2016-04-20: qty 1

## 2016-04-20 MED ORDER — ONDANSETRON HCL 4 MG PO TABS: 4.0000 mg | ORAL_TABLET | Freq: Three times a day (TID) | ORAL | Status: DC | PRN

## 2016-04-20 MED ORDER — HALOPERIDOL LACTATE 5 MG/ML IJ SOLN
5.0000 mg | Freq: Once | INTRAMUSCULAR | Status: AC
  Administered 2016-04-20: 5 mg via INTRAMUSCULAR
  Filled 2016-04-20: qty 1

## 2016-04-20 MED ORDER — LORAZEPAM 1 MG PO TABS: 1.0000 mg | ORAL_TABLET | Freq: Three times a day (TID) | ORAL | Status: DC | PRN
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
## 2016-04-20 NOTE — ED Notes (Signed)
Pt is calm and resting comfortably in bed. Equal chest rise and fall.

## 2016-04-20 NOTE — BH Assessment (Addendum)
TTS assessment attempted, however, pt was given medication and was sedated.  Clinician contacted EDP and asked for current consult to be removed and added again once pt was alert and ready to be assessed. EDP confirmed new consult would be added.    Orlie Pollen, [facility], [facility] 04/20/2016 9:47 PM

## 2016-04-21 DIAGNOSIS — F3111 Bipolar disorder, current episode manic without psychotic features, mild: Secondary | ICD-10-CM | POA: Diagnosis present

## 2016-04-21 LAB — RAPID URINE DRUG SCREEN, HOSP PERFORMED
Amphetamines: NOT DETECTED
Barbiturates: NOT DETECTED
Benzodiazepines: POSITIVE — AB
Cocaine: POSITIVE — AB
Opiates: NOT DETECTED
Tetrahydrocannabinol: POSITIVE — AB

## 2016-04-21 LAB — PREGNANCY, URINE: PREG TEST UR: NEGATIVE

## 2016-04-21 MED ORDER — HYDROXYZINE PAMOATE 25 MG PO CAPS: 25.0000 mg | ORAL_CAPSULE | Freq: Three times a day (TID) | ORAL | 0 refills | Status: DC | PRN

## 2016-04-21 MED ORDER — AMANTADINE HCL 100 MG PO CAPS
100.0000 mg | ORAL_CAPSULE | Freq: Two times a day (BID) | ORAL | Status: DC
  Administered 2016-04-21: 100 mg via ORAL
  Filled 2016-04-21: qty 1

## 2016-04-21 MED ORDER — ZIPRASIDONE HCL 20 MG PO CAPS: 60.0000 mg | ORAL_CAPSULE | Freq: Two times a day (BID) | ORAL | Status: DC

## 2016-04-21 MED ORDER — MIRTAZAPINE 15 MG PO TABS: 15.0000 mg | ORAL_TABLET | Freq: Every day | ORAL | 0 refills | Status: DC

## 2016-04-21 MED ORDER — ZIPRASIDONE HCL 60 MG PO CAPS: 60.0000 mg | ORAL_CAPSULE | Freq: Two times a day (BID) | ORAL | 0 refills | Status: DC

## 2016-04-21 MED ORDER — AMANTADINE HCL 100 MG PO CAPS: 100.0000 mg | ORAL_CAPSULE | Freq: Two times a day (BID) | ORAL | 0 refills | Status: DC

## 2016-04-21 MED ORDER — HYDROXYZINE PAMOATE 25 MG PO CAPS: 25.0000 mg | ORAL_CAPSULE | Freq: Three times a day (TID) | ORAL | Status: DC | PRN

## 2016-04-21 MED ORDER — MIRTAZAPINE 30 MG PO TABS: 15.0000 mg | ORAL_TABLET | Freq: Every day | ORAL | Status: DC

## 2016-04-21 MED ORDER — POTASSIUM CHLORIDE CRYS ER 20 MEQ PO TBCR
40.0000 meq | EXTENDED_RELEASE_TABLET | Freq: Once | ORAL | Status: AC
  Administered 2016-04-21: 40 meq via ORAL
  Filled 2016-04-21: qty 2

## 2016-04-21 MED ORDER — HYDROXYZINE HCL 25 MG PO TABS: 25.0000 mg | ORAL_TABLET | Freq: Three times a day (TID) | ORAL | Status: DC | PRN

## 2016-04-21 NOTE — ED Notes (Signed)
Pt taking a shower 

## 2016-04-21 NOTE — [facility] Suicide Risk Assessment (Signed)
Suicide Risk Assessment  Discharge Assessment   [facility] Discharge Suicide Risk Assessment   Principal Problem: Bipolar affective disorder, manic, mild (HCC) Discharge Diagnoses:  Patient Active Problem List   Diagnosis Date Noted  . Bipolar affective disorder, manic, mild (HCC) [F31.11] 04/21/2016    Priority: High    Total Time spent with patient: 45 minutes  Musculoskeletal: Strength & Muscle Tone: within normal limits Gait & Station: normal Patient leans: N/A  Psychiatric Specialty Exam:   Blood pressure 124/94, pulse 78, temperature 98.1 F (36.7 C), temperature source Oral, resp. rate 18, SpO2 100 %.There is no height or weight on file to calculate BMI.  General Appearance: Casual  Eye Contact::  Good  Speech:  Normal Rate  Volume:  Normal  Mood:  Euthymic  Affect:  Congruent  Thought Process:  Coherent and Descriptions of Associations: Intact  Orientation:  Full (Time, Place, and Person)  Thought Content:  WDL  Suicidal Thoughts:  No  Homicidal Thoughts:  No  Memory:  Immediate;   Good Recent;   Good Remote;   Good  Judgement:  Fair  Insight:  Fair  Psychomotor Activity:  Normal  Concentration:  Good  Recall:  Good  Fund of Knowledge:Fair  Language: Good  Akathisia:  No  Handed:  Right  AIMS (if indicated):     Assets:  Leisure Time Physical Health Resilience  Sleep:     Cognition: WNL  ADL's:  Intact   Mental Status Per Nursing Assessment::   On Admission:   mania   Demographic Factors:  NA  Loss Factors: NA  Historical Factors: NA  Risk Reduction Factors:   Sense of responsibility to family, Living with another person, especially a relative and Positive social support  Continued Clinical Symptoms:  None   Cognitive Features That Contribute To Risk:  None    Suicide Risk:  Minimal: No identifiable suicidal ideation.  Patients presenting with no risk factors but with morbid ruminations; may be classified as minimal risk based on the  severity of the depressive symptoms    Plan Of Care/Follow-up recommendations:  Activity:  as tolerated Diet:  heart healthy diet  LORD, JAMISON, NP 04/21/2016, 11:56 AM
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
## 2016-04-21 NOTE — BH Assessment (Signed)
[facility] Assessment Progress Note  Per Thedore Mins, MD, this pt does not require psychiatric hospitalization at this time.  Pt is to be discharged from [facility] with referral information for [facility].  This has been included in pt's discharge instructions.  Pt's nurse, Morrie Sheldon, has been notified.  Doylene Canning, MA Triage Specialist [phone]

## 2016-04-21 NOTE — ED Notes (Signed)
Pt admitted to room #43. Pt forwards little with this nurse, reports she has already had a psychiatric assessment.. Pt denies SI/HI. Denies AVH. Special checks q 15 mins in place for safety. Video monitoring in place.

## 2016-04-21 NOTE — Discharge Instructions (Signed)
For your ongoing mental health needs, you are advised to follow up with [facility].  New and returning patients are seen at their walk-in clinic.  Walk-in hours are Monday - Friday from 8:00 am - 3:00 pm.  Walk-in patients are seen on a first come, first served basis.  Try to arrive as early as possible for he best chance of being seen the same day: ° °     [facility] °     [address] °     [place], [place] [address] °     [phone] °

## 2016-04-21 NOTE — BH Assessment (Signed)
TTS Assessment ordered for this patient last night. TTS staff tried to assess patient but she was given medication and sedated. Writer again attempted to assess patient this am. Patient sts, "I'm going to eat my breakfast first so come back later". Writer informed patient she would be given time to eat her breakfast. Writer asked patient's nurse to make this writer aware of when patient finishes eating so that her TTS can be completed.

## 2016-04-21 NOTE — ED Notes (Signed)
Pt d/c home per MD order. Pt denies SI/HI. Denies AVH. Discharge summary reviewed with pt. Pt verbalizes understanding of discharge summary and outpatient resources. RX provided. Pt signed for personal property and property returned. Pt signed e-signature. Bus pass provided per pt request. Pt ambulatory off unit.

## 2016-05-05 ENCOUNTER — Encounter (HOSPITAL_COMMUNITY): Payer: Self-pay | Admitting: Family Medicine

## 2016-05-05 ENCOUNTER — Emergency Department (HOSPITAL_COMMUNITY)
Admission: EM | Admit: 2016-05-05 | Discharge: 2016-05-05 | Discharge status: Against Medical Advice | Payer: Medicare Other | Attending: Emergency Medicine | Admitting: Emergency Medicine

## 2016-05-05 ENCOUNTER — Emergency Department (HOSPITAL_COMMUNITY): Payer: Medicare Other

## 2016-05-05 DIAGNOSIS — Z5321 Procedure and treatment not carried out due to patient leaving prior to being seen by health care provider: Secondary | ICD-10-CM | POA: Insufficient documentation

## 2016-05-05 DIAGNOSIS — F17210 Nicotine dependence, cigarettes, uncomplicated: Secondary | ICD-10-CM | POA: Insufficient documentation

## 2016-05-05 DIAGNOSIS — I110 Hypertensive heart disease with heart failure: Secondary | ICD-10-CM | POA: Diagnosis not present

## 2016-05-05 DIAGNOSIS — I509 Heart failure, unspecified: Secondary | ICD-10-CM | POA: Diagnosis not present

## 2016-05-05 DIAGNOSIS — M25571 Pain in right ankle and joints of right foot: Secondary | ICD-10-CM | POA: Insufficient documentation

## 2016-05-05 DIAGNOSIS — R1010 Upper abdominal pain, unspecified: Secondary | ICD-10-CM | POA: Diagnosis not present

## 2016-05-05 LAB — URINALYSIS, ROUTINE W REFLEX MICROSCOPIC
Bilirubin Urine: NEGATIVE
Glucose, UA: NEGATIVE mg/dL
Hgb urine dipstick: NEGATIVE
Ketones, ur: NEGATIVE mg/dL
NITRITE: NEGATIVE
PH: 6 (ref 5.0–8.0)
Protein, ur: NEGATIVE mg/dL
SPECIFIC GRAVITY, URINE: 1.017 (ref 1.005–1.030)

## 2016-05-05 LAB — URINE MICROSCOPIC-ADD ON
BACTERIA UA: NONE SEEN
RBC / HPF: NONE SEEN RBC/hpf (ref 0–5)
SQUAMOUS EPITHELIAL / LPF: NONE SEEN

## 2016-05-05 LAB — POC URINE PREG, ED: PREG TEST UR: NEGATIVE

## 2016-05-05 NOTE — ED Notes (Signed)
Patient not in room for lab work. RN aware.

## 2016-05-05 NOTE — ED Notes (Signed)
Registration seen patient walk out the front door. RN made aware.
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
## 2016-05-06 ENCOUNTER — Encounter (HOSPITAL_COMMUNITY): Payer: Self-pay | Admitting: Physician Assistant

## 2016-05-06 NOTE — ED Provider Notes (Signed)
Pt left AMA with + urine. Will call in Keflex. 500 mg q 6 hours X 3 days.    Benna Arno Randall An, MD 05/06/16 0800

## 2016-05-07 ENCOUNTER — Other Ambulatory Visit: Payer: Self-pay

## 2016-05-07 NOTE — Patient Outreach (Signed)
[facility]) Care Management  05/07/2016  Christine Cobb [birth year] [id number]   Telephone Screen  Referral Date: 05/07/16 Referral Source: [facility] ED Census Referral Reason: "4 or more ED visits in the past 6 months"    Outreach attempt # 1 to patient. No answer at present and unable to leave message.     Plan: RN CM will make outreach attempt to patient within a week.  Antionette Fairy, RN,BSN,CCM [facility] Care Management Telephonic Care Management Coordinator Direct Phone: [phone] Toll Free: [phone] Fax: [phone]

## 2016-05-11 ENCOUNTER — Other Ambulatory Visit: Payer: Self-pay

## 2016-05-11 NOTE — Patient Outreach (Signed)
[facility]) Care Management  05/11/2016  Christine Cobb 1975-11-25 [id number]   Telephone Screen  Referral Date: 05/07/16 Referral Source: [facility] ED Census Referral Reason: "4 or more ED visits in the past 6 months"   Outreach attempt #2. Call went straight to voicemail but mailbox full.    Plan: RN CM will make outreach attempt to patient within a week.  Antionette Fairy, RN,BSN,CCM [facility] Care Management Telephonic Care Management Coordinator Direct Phone: [phone] Toll Free: [phone] Fax: [phone]

## 2016-05-14 ENCOUNTER — Other Ambulatory Visit: Payer: Self-pay

## 2016-05-14 NOTE — Patient Outreach (Signed)
[facility]) Care Management  05/14/2016  Shanterria Lopezrodriguez 07-17-1975 [id number]    Telephone Screen  Referral Date: 05/07/16 Referral Source: [facility] ED Census Referral Reason: "4 or more ED visits in the past 6 months"    Outreach attempt #3 to patient. No answer and unable to leave message.    Plan: RN CM will send unsuccessful outreach letter to patient and close case if no response from patient within 10 business days.   Antionette Fairy, RN,BSN,CCM [facility] Care Management Telephonic Care Management Coordinator Direct Phone: [phone] Toll Free: [phone] Fax: [phone]

## 2016-05-28 ENCOUNTER — Other Ambulatory Visit: Payer: Self-pay

## 2016-05-28 NOTE — Patient Outreach (Signed)
[facility]) Care Management  05/28/2016  Christine Cobb 1975-12-17 [id number]     Telephone Screen  Referral Date: 05/07/16 Referral Source: [facility] ED Census Referral Reason: "4 or more ED visits in the past 6 months"     Multiple attempts to establish contact with patient. No response from letter mailed to patient. Case is being closed at this time.   Plan: RN CM will notify [facility] administrative assistant of case closure status. RN CM unable to send MD case closure letter as PCP listed on file for patient invalid.    Antionette Fairy, RN,BSN,CCM [facility] Care Management Telephonic Care Management Coordinator Direct Phone: [phone] Toll Free: [phone] Fax: [phone] '

## 2016-06-01 ENCOUNTER — Encounter (HOSPITAL_COMMUNITY): Payer: Self-pay

## 2016-06-01 ENCOUNTER — Emergency Department (HOSPITAL_COMMUNITY)
Admission: EM | Admit: 2016-06-01 | Discharge: 2016-06-02 | Disposition: A | Discharge status: Home / Self Care | Payer: Medicare Other | Attending: Emergency Medicine | Admitting: Emergency Medicine

## 2016-06-01 DIAGNOSIS — Z825 Family history of asthma and other chronic lower respiratory diseases: Secondary | ICD-10-CM | POA: Diagnosis not present

## 2016-06-01 DIAGNOSIS — F1494 Cocaine use, unspecified with cocaine-induced mood disorder: Secondary | ICD-10-CM | POA: Diagnosis present

## 2016-06-01 DIAGNOSIS — Z8249 Family history of ischemic heart disease and other diseases of the circulatory system: Secondary | ICD-10-CM | POA: Diagnosis not present

## 2016-06-01 DIAGNOSIS — F1414 Cocaine abuse with cocaine-induced mood disorder: Secondary | ICD-10-CM | POA: Diagnosis not present

## 2016-06-01 DIAGNOSIS — I110 Hypertensive heart disease with heart failure: Secondary | ICD-10-CM | POA: Insufficient documentation

## 2016-06-01 DIAGNOSIS — I509 Heart failure, unspecified: Secondary | ICD-10-CM | POA: Insufficient documentation

## 2016-06-01 DIAGNOSIS — Z79899 Other long term (current) drug therapy: Secondary | ICD-10-CM | POA: Insufficient documentation

## 2016-06-01 DIAGNOSIS — Z833 Family history of diabetes mellitus: Secondary | ICD-10-CM | POA: Diagnosis not present

## 2016-06-01 DIAGNOSIS — F919 Conduct disorder, unspecified: Secondary | ICD-10-CM | POA: Diagnosis present

## 2016-06-01 DIAGNOSIS — F17210 Nicotine dependence, cigarettes, uncomplicated: Secondary | ICD-10-CM | POA: Insufficient documentation

## 2016-06-01 DIAGNOSIS — F1410 Cocaine abuse, uncomplicated: Secondary | ICD-10-CM | POA: Diagnosis present

## 2016-06-01 LAB — CBC WITH DIFFERENTIAL/PLATELET
BASOS PCT: 1 %
Basophils Absolute: 0.1 10*3/uL (ref 0.0–0.1)
EOS ABS: 0.1 10*3/uL (ref 0.0–0.7)
EOS PCT: 2 %
HCT: 43 % (ref 36.0–46.0)
HEMOGLOBIN: 14.5 g/dL (ref 12.0–15.0)
LYMPHS ABS: 2.2 10*3/uL (ref 0.7–4.0)
Lymphocytes Relative: 41 %
MCH: 28.5 pg (ref 26.0–34.0)
MCHC: 33.7 g/dL (ref 30.0–36.0)
MCV: 84.6 fL (ref 78.0–100.0)
MONO ABS: 0.6 10*3/uL (ref 0.1–1.0)
MONOS PCT: 11 %
NEUTROS PCT: 45 %
Neutro Abs: 2.4 10*3/uL (ref 1.7–7.7)
Platelets: 302 10*3/uL (ref 150–400)
RBC: 5.08 MIL/uL (ref 3.87–5.11)
RDW: 13.7 % (ref 11.5–15.5)
WBC: 5.4 10*3/uL (ref 4.0–10.5)

## 2016-06-01 LAB — I-STAT BETA HCG BLOOD, ED (MC, WL, AP ONLY)

## 2016-06-01 LAB — COMPREHENSIVE METABOLIC PANEL
ALBUMIN: 4.4 g/dL (ref 3.5–5.0)
ALK PHOS: 62 U/L (ref 38–126)
ALT: 14 U/L (ref 14–54)
AST: 20 U/L (ref 15–41)
Anion gap: 8 (ref 5–15)
BILIRUBIN TOTAL: 0.8 mg/dL (ref 0.3–1.2)
BUN: 21 mg/dL — AB (ref 6–20)
CALCIUM: 9.3 mg/dL (ref 8.9–10.3)
CO2: 25 mmol/L (ref 22–32)
CREATININE: 0.99 mg/dL (ref 0.44–1.00)
Chloride: 107 mmol/L (ref 101–111)
GFR calc Af Amer: >60 mL/min (ref >60)
GFR calc non Af Amer: >60 mL/min (ref >60)
GLUCOSE: 87 mg/dL (ref 65–99)
Potassium: 4 mmol/L (ref 3.5–5.1)
SODIUM: 140 mmol/L (ref 135–145)
TOTAL PROTEIN: 7.8 g/dL (ref 6.5–8.1)

## 2016-06-01 LAB — ETHANOL: Alcohol, Ethyl (B): <5 mg/dL (ref <5)

## 2016-06-01 MED ORDER — ZIPRASIDONE MESYLATE 20 MG IM SOLR
INTRAMUSCULAR | Status: AC
  Filled 2016-06-01: qty 20

## 2016-06-01 MED ORDER — STERILE WATER FOR INJECTION IJ SOLN
INTRAMUSCULAR | Status: AC
  Filled 2016-06-01: qty 10

## 2016-06-01 MED ORDER — ZIPRASIDONE MESYLATE 20 MG IM SOLR
5.0000 mg | Freq: Once | INTRAMUSCULAR | Status: AC
  Administered 2016-06-01: 5 mg via INTRAMUSCULAR

## 2016-06-01 MED ORDER — ZIPRASIDONE MESYLATE 20 MG IM SOLR
10.0000 mg | Freq: Once | INTRAMUSCULAR | Status: AC
  Administered 2016-06-01: 10 mg via INTRAMUSCULAR

## 2016-06-01 MED ORDER — LORAZEPAM 2 MG/ML IJ SOLN
2.0000 mg | Freq: Once | INTRAMUSCULAR | Status: AC
  Administered 2016-06-01: 2 mg via INTRAMUSCULAR
  Filled 2016-06-01: qty 1

## 2016-06-01 MED ORDER — STERILE WATER FOR INJECTION IJ SOLN
INTRAMUSCULAR | Status: AC
  Administered 2016-06-01: 1.2 mL
  Filled 2016-06-01: qty 10

## 2016-06-01 MED ORDER — ACETAMINOPHEN 325 MG PO TABS: 650.0000 mg | ORAL_TABLET | ORAL | Status: DC | PRN

## 2016-06-01 MED ORDER — ZIPRASIDONE MESYLATE 20 MG IM SOLR
20.0000 mg | Freq: Once | INTRAMUSCULAR | Status: AC
  Administered 2016-06-01: 20 mg via INTRAMUSCULAR
  Filled 2016-06-01: qty 20
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
## 2016-06-01 NOTE — ED Notes (Signed)
Unable to collect labs because of patient behavior 

## 2016-06-01 NOTE — ED Notes (Signed)
[place] T[facility] at bedside 

## 2016-06-01 NOTE — ED Notes (Addendum)
Pt transferred from TCU, feces in underpants,, restraints removed in TCU, pt presents agitated, cursing at staff, security and [employer] officers. Smearing feces on countertops and sheets in her room.  Pt walked into female pts room with bottoms off and feces smeared on her buttocks.  Redirected back to room with assistance of Security and [employer].  Dr Judd Lien contacted for medication orders.  Meds given, pt placed in 4 point restraints. SAFETY CHECK FOR CONTRABAND COMPLETED.

## 2016-06-01 NOTE — ED Notes (Signed)
Pt refused post restraint vital signs. 

## 2016-06-01 NOTE — BH Assessment (Signed)
Assessment Note  Christine Cobb is an 40 y.o. female with history of Bipolar Affective Disorder. Pt denies SI and AVH at this time. She does admit to homicidal thoughts toward, "Every damn body". She tells this Clinical research associate, "You got two questions to ask me bitch and then you need to leave".  Pt is delusional and believes she is 3 months pregnant with twins. Patient pointed to a man in the hall stating, "That's him.Marland KitchenMarland KitchenThat's him". She further sts that a staff member was the father of her child. Patient began covering her head with the shifts stating, "I'm scared.Marland KitchenMarland KitchenI'm scared". 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   Pt reported that she has been hospitalized in the past. Pt stated "I am supposed to be at [facility], that's where all my records are at". Pt also requested that this clinician contact [facility] to get her records so that she won't have to answer any questions. Pt reported that she is depressed and stated "what pregnant woman you know isn't a little depressed". Pt denied having any guns but stated "I am a weapon and I can find a weapon if need, I am an evangelist".   Patient was overall uncooperative with answering most of the assessment questions. She was dressed in scrubs. She made very little eye contact with this Clinical research associate. Patient's speech is pressured. She has flight of ideas. She is not oriented to situation or time.   Writer asked patient about drug use but she was reluctant to discuss details. Patient admitted to some THC use. BAL was negative; No UDS as of G[place]er discussed plan of care with [name], DNP. The recommendation is INPT hospitalization.   Diagnosis: Bipolar Affective Disorder, Manic Episode   Past Medical History:  Past Medical History:  Diagnosis Date  . Arm pain   . Bipolar affective disorder,  currently manic, mild (HCC)   . CHF (congestive heart failure) (HCC)   . Eye globe prosthesis   . HTN (hypertension)   . Hyperthyroidism   . Sinus tachycardia     Past Surgical History:  Procedure Laterality Date  . DILATION AND CURETTAGE OF UTERUS    . EYE SURGERY      Family History:  Family History  Problem Relation Age of Onset  . Hypertension Other   . Emphysema Other   . Asthma Son   . Diabetes Maternal Uncle   . Diabetes Paternal Grandmother     Social History:  reports that she has been smoking Cigarettes.  She has never used smokeless tobacco. She reports that she drinks alcohol. She reports that she uses drugs, including Marijuana.  Additional Social History:  Alcohol / Drug Use Pain Medications: SEE MAR Prescriptions: SEE MAR Over the Counter: SEE MAR History of alcohol / drug use?: Yes Longest period of sobriety (when/how long): 3 years Negative Consequences of Use: Legal Substance #1 Name of Substance 1: THC (UDS pending)  1 - Age of First Use: 40 yrs old  1 - Amount (size/oz): "As much as I can get" 1 - Frequency: "Every other day or daily" 1 - Duration: since the age of 5 1 - Last Use / Amount: "I can't remember" Substance #2 Name of Substance 2: BAL negative  2 - Age of First Use: n/a 2 - Amount (  size/oz): n/a 2 - Frequency: n/a 2 - Duration: n/a 2 - Last Use / Amount: n/a  CIWA:   COWS:    Allergies:  Allergies  Allergen Reactions  . Amoxil [Amoxicillin] Anaphylaxis  . Trazodone And Nefazodone Anaphylaxis  . Ketorolac Tromethamine Hives and Swelling  . Prenatal [B-Plex Plus] Nausea Only  . Tegretol [Carbamazepine] Other (See Comments)    Swelling, skin peeling, skin discoloration  . B-Plex Plus Nausea Only    Prenatal   . Tegretol [Carbamazepine] Swelling and Other (See Comments)    Skin peeling Skin discoloring     Home Medications:  (Not in a hospital admission)  OB/GYN Status:  No LMP recorded (lmp unknown).  General  Assessment Data Location of Assessment: [facility] ED TTS Assessment: In system Is this a Tele or Face-to-Face Assessment?: Face-to-Face Is this an Initial Assessment or a Re-assessment for this encounter?: Initial Assessment Marital status: Single Maiden name:  (n/a) Is patient pregnant?: No Pregnancy Status: No Living Arrangements: Other (Comment) (Patient staying at the [facility]) Can pt return to current living arrangement?: No Admission Status: Voluntary Is patient capable of signing voluntary admission?: No Referral Source: Self/Family/Friend Insurance type:  (Self Pay )     Crisis Care Plan Living Arrangements: Other (Comment) (Patient staying at the [facility]) Legal Guardian: Other: Name of Psychiatrist:  (No psychiatrist ) Name of Therapist:  (No therapist )  Education Status Is patient currently in school?:  (n/a) Current Grade:  (n/a) Highest grade of school patient has completed:  (college) Name of school: n/a Contact person:  (n/a)  Risk to self with the past 6 months Suicidal Ideation: No Has patient been a risk to self within the past 6 months prior to admission? : No Has patient had any suicidal intent within the past 6 months prior to admission? :  (unable to determine; pt refused to respond ) Is patient at risk for suicide?: Yes (unk) Suicidal Plan?: No Access to Means: No (Patient sts, "I don't know") What has been your use of drugs/alcohol within the last 12 months?:  (patient denies; no UDS as of 1749; BAL negative ) Previous Attempts/Gestures: No How many times?:  (0) Other Self Harm Risks:  (denies ) Triggers for Past Attempts: Other (Comment) (no triggers) Intentional Self Injurious Behavior: None Family Suicide History: No Recent stressful life event(s): Other (Comment) Persecutory voices/beliefs?: No Depression: Yes Depression Symptoms: Loss of interest in usual pleasures Substance abuse history and/or treatment for substance abuse?: Yes Suicide  prevention information given to non-admitted patients: Not applicable  Risk to Others within the past 6 months Homicidal Ideation: Yes-Currently Present Does patient have any lifetime risk of violence toward others beyond the six months prior to admission? : Yes (comment) Thoughts of Harm to Others: Yes-Currently Present Comment - Thoughts of Harm to Others:  ("I want to hurt ever damn body") Current Homicidal Intent: Yes-Currently Present Current Homicidal Plan: Yes-Currently Present Describe Current Homicidal Plan:  ("I got plans but I'm not telling your ass") Access to Homicidal Means: Yes Describe Access to Homicidal Means:  ("I got all kind of shit I can use"; "I'll find something") Identified Victim:  ("Every damn body") History of harm to others?: No ("Hell nah") Assessment of Violence: On admission (on admission patient was uncooperative with staff ) Violent Behavior Description:  (on admission patient was uncooperative with staff ) Does patient have access to weapons?: Yes (Comment) ("Hell yeah.Marland KitchenMarland KitchenI'll find something") Criminal Charges Pending?: Yes Describe Pending Criminal Charges:  ("I  can't remember") Does patient have a court date: Yes Court Date:  ("I think so I just can't remember") Is patient on probation?: No  Psychosis Hallucinations: None noted Delusions: Grandiose, Unspecified (Patient stating that she is pregnant and here to give birth )  Mental Status Report Appearance/Hygiene: Disheveled, In scrubs Eye Contact: Poor Motor Activity: Restlessness, Agitation Speech: Pressured, Argumentative Level of Consciousness: Restless, Irritable Mood: Anxious, Angry, Irritable, Preoccupied Affect: Angry Anxiety Level: None Thought Processes: Flight of Ideas, Tangential, Circumstantial, Irrelevant Judgement: Impaired Orientation: Person, Place, Time, Situation Obsessive Compulsive Thoughts/Behaviors: None  Cognitive Functioning Concentration: Decreased Memory: Recent  Intact, Remote Intact IQ: Average Insight: Fair Impulse Control: Fair Appetite:  (pt refused to answer; covering head) Weight Loss:  (unk) Weight Gain:  (unk) Sleep: Unable to Assess (refused to answer covering head) Total Hours of Sleep:  (unk) Vegetative Symptoms: Unable to Assess  ADLScreening [facility] Assessment Services) Patient's cognitive ability adequate to safely complete daily activities?: Yes Patient able to express need for assistance with ADLs?: Yes Independently performs ADLs?: Yes (appropriate for developmental age)  Prior Inpatient Therapy Prior Inpatient Therapy: Yes Prior Therapy Dates:  ("yrs ago") [facility] Reason for Treatment:  (medication managment )  Prior Outpatient Therapy Prior Outpatient Therapy: No Prior Therapy Dates:  (n/a) Prior Therapy Facilty/Provider(s):  (n/a) Reason for Treatment:  (n/a) Does patient have an ACCT team?: No Does patient have Intensive In-House Services?  : No Does patient have [facility] services? : No Does patient have P4[facility] services?: No  ADL Screening (condition at time of admission) Patient's cognitive ability adequate to safely complete daily activities?: Yes Patient able to express need for assistance with ADLs?: Yes Independently performs ADLs?: Yes (appropriate for developmental age)             Merchant navy officerAdvance Directives (For Healthcare) Does Patient Have a Medical Advance Directive?: No    Additional Information 1:1 In Past 12 Months?: No CIRT Risk: No Elopement Risk: No Does patient have medical clearance?: Yes     Disposition:  Disposition Initial Assessment Completed for this Encounter: Yes Disposition of Patient: Inpatient treatment program (Per Nanine MeansJamison Lord, DNP, patient meets criteria for INPT [place]) Type of inpatient treatment program: Adult  On Site Evaluation by:   Reviewed with Physician:  Nanine MeansJamison Lord, DNP  Melynda Rippleoyka Oluwadamilola Deliz 06/01/2016 6:03 PM

## 2016-06-01 NOTE — ED Notes (Signed)
Restraint Type - Type of restraint used: Hard ankle; Hard wrist Orientation: Ankle, Bilateral Restraint Status: Continued Assessment Type - Assessment type: Every 15 minute Initial Assessment - Criteria for release: Cessation of Physical Aggression; Cessation of Threats/Verbal Aggression; Positive Response to Medication Resulting in Pt's ability to manage behavior; Rational, Compliant and Able to Follow Directions Patient informed of Criteria for Release?: Yes Every 15 minute & Post-release assessment - Circulation/Skin check: Normal color Level of distress/agitation: Agitated/restless; Verbally abusive Meets Criteria for Release: No Monitored 1:1: Yes Privacy maintained: Yes Psychological Status/Comfort: Alert Respiratory Status: Easy and unlabored Signs of injury with restraint: No Emotional / Mental Status: Agitated/restless; Verbally abusive

## 2016-06-01 NOTE — ED Notes (Signed)
Bed: ZD66 Expected date:  Expected time:  Means of arrival:  Comments: Pt w/ [employer]

## 2016-06-01 NOTE — Progress Notes (Signed)
Patient was recommended inpatient treatment per Nanine Means DNP, on 06/01/16. Referrals have been sent to the following hospitals: [facility], [facility], [facility], [facility], [place], [facility], [place].  Melbourne Abts, LCSWA Disposition staff 06/01/2016 9:46 PM
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
## 2016-06-02 DIAGNOSIS — Z833 Family history of diabetes mellitus: Secondary | ICD-10-CM

## 2016-06-02 DIAGNOSIS — Z825 Family history of asthma and other chronic lower respiratory diseases: Secondary | ICD-10-CM | POA: Diagnosis not present

## 2016-06-02 DIAGNOSIS — F1414 Cocaine abuse with cocaine-induced mood disorder: Secondary | ICD-10-CM | POA: Diagnosis not present

## 2016-06-02 DIAGNOSIS — Z8249 Family history of ischemic heart disease and other diseases of the circulatory system: Secondary | ICD-10-CM

## 2016-06-02 DIAGNOSIS — Z888 Allergy status to other drugs, medicaments and biological substances status: Secondary | ICD-10-CM

## 2016-06-02 DIAGNOSIS — F1494 Cocaine use, unspecified with cocaine-induced mood disorder: Secondary | ICD-10-CM | POA: Diagnosis present

## 2016-06-02 DIAGNOSIS — F17210 Nicotine dependence, cigarettes, uncomplicated: Secondary | ICD-10-CM

## 2016-06-02 DIAGNOSIS — Z79899 Other long term (current) drug therapy: Secondary | ICD-10-CM

## 2016-06-02 MED ORDER — LORAZEPAM 2 MG/ML IJ SOLN
2.0000 mg | Freq: Once | INTRAMUSCULAR | Status: DC
  Filled 2016-06-02: qty 1

## 2016-06-02 MED ORDER — ZIPRASIDONE MESYLATE 20 MG IM SOLR
20.0000 mg | Freq: Once | INTRAMUSCULAR | Status: AC
  Administered 2016-06-02: 20 mg via INTRAMUSCULAR
  Filled 2016-06-02: qty 20

## 2016-06-02 MED ORDER — CHLORPROMAZINE HCL 25 MG PO TABS: 50.0000 mg | ORAL_TABLET | Freq: Once | ORAL | Status: DC

## 2016-06-02 MED ORDER — DIPHENHYDRAMINE HCL 50 MG/ML IJ SOLN
25.0000 mg | Freq: Once | INTRAMUSCULAR | Status: AC
  Administered 2016-06-02: 25 mg via INTRAMUSCULAR
  Filled 2016-06-02: qty 1

## 2016-06-02 MED ORDER — DIPHENHYDRAMINE HCL 50 MG/ML IJ SOLN
50.0000 mg | Freq: Once | INTRAMUSCULAR | Status: AC
  Administered 2016-06-02: 50 mg via INTRAMUSCULAR
  Filled 2016-06-02: qty 1

## 2016-06-02 MED ORDER — CHLORPROMAZINE HCL 25 MG/ML IJ SOLN
50.0000 mg | Freq: Once | INTRAMUSCULAR | Status: AC
  Administered 2016-06-02: 50 mg via INTRAMUSCULAR
  Filled 2016-06-02: qty 2

## 2016-06-02 MED ORDER — LORAZEPAM 2 MG/ML IJ SOLN
2.0000 mg | Freq: Once | INTRAMUSCULAR | Status: AC
  Administered 2016-06-02: 2 mg via INTRAMUSCULAR
  Filled 2016-06-02: qty 1

## 2016-06-02 NOTE — [facility] Suicide Risk Assessment (Addendum)
Suicide Risk Assessment  Discharge Assessment   [facility]   Principal Problem: Cocaine abuse with cocaine-induced mood disorder [facility](HCC) Discharge Diagnoses:  Patient Active Problem List   Diagnosis Date Noted  . Cocaine abuse with cocaine-induced mood disorder [facility](HCC) [F14.14] 06/02/2016    Priority: High  . Cocaine abuse [F14.10] 12/31/2014    Priority: High  . Schizoaffective disorder, bipolar type (HCC) [F25.0]   . Bipolar disorder, current episode manic without psychotic features, severe (HCC) [F31.13]   . Agitation [R45.1]   . Manic behavior (HCC) [F30.10]   . Hyperthyroidism [E05.90] 09/21/2013  . Marijuana abuse [F12.10] 04/12/2013  . History of CHF (congestive heart failure) [Z86.79] 02/01/2013  . Abscess of abdominal wall [L02.211] 10/18/2012  . HTN (hypertension) [I10] 04/08/2011  . Tachycardia [R00.0] 04/08/2011    Total Time spent with patient: 45 minutes  Musculoskeletal: Strength & Muscle Tone: within normal limits Gait & Station: normal Patient leans: N/A  Psychiatric Specialty Exam: Physical Exam  Constitutional: She is oriented to person, place, and time. She appears well-developed and well-nourished.  HENT:  Head: Normocephalic.  Neck: Normal range of motion.  Respiratory: Effort normal.  Musculoskeletal: Normal range of motion.  Neurological: She is alert and oriented to person, place, and time.  Psychiatric: Her speech is normal. Judgment and thought content normal. Her affect is angry. Cognition and memory are normal. She is inattentive.    Review of Systems  Constitutional: Negative.   HENT: Negative.   Eyes: Negative.   Respiratory: Negative.   Cardiovascular: Negative.   Gastrointestinal: Negative.   Genitourinary: Negative.   Musculoskeletal: Negative.   Skin: Negative.   Neurological: Negative.   Endo/Heme/Allergies: Negative.   Psychiatric/Behavioral: Positive for substance abuse. The patient is  nervous/anxious.     Blood pressure 117/76, pulse 97, temperature 98.9 F (37.2 C), temperature source Oral, resp. rate 19, SpO2 98 %, unknown if currently breastfeeding.There is no height or weight on file to calculate BMI.  General Appearance: Casual  Eye Contact:  Good  Speech:  Normal Rate  Volume:  Increased  Mood:  Angry  Affect:  Congruent  Thought Process:  Coherent and Descriptions of Associations: Intact  Orientation:  Full (Time, Place, and Person)  Thought Content:  WDL  Suicidal Thoughts:  No  Homicidal Thoughts:  No  Memory:  Immediate;   Fair Recent;   Fair Remote;   Fair  Judgement:  Fair  Insight:  Fair  Psychomotor Activity:  Normal  Concentration:  Concentration: Fair and Attention Span: Fair  Recall:  [employer]Fair  Fund of Knowledge:  Fair  Language:  Fair  Akathisia:  No  Handed:  Right  AIMS (if indicated):     Assets:  Leisure Time Physical Health Resilience  ADL's:  Intact  Cognition:  WNL  Sleep:      Mental Status Per Nursing Assessment::   On Admission:   cocaine abuse with agitation  Demographic Factors:  NA  Loss Factors: NA  Historical Factors: Impulsivity  Risk Reduction Factors:   Sense of responsibility to family  Continued Clinical Symptoms:  Angry at times  Cognitive Features That Contribute To Risk:  None    Suicide Risk:  Minimal: No identifiable suicidal ideation.  Patients presenting with no risk factors but with morbid ruminations; may be classified as minimal risk based on the severity of the depressive symptoms    Plan Of Care/Follow-up recommendations:  Activity:  as tolerated Diet:  heart healthy diet  Dalanie Kisner, NP  06/02/2016, 1:20 PM

## 2016-06-02 NOTE — ED Notes (Addendum)
Dr Lenore Cordia and Catha Nottingham DNP into see.  Pt reports that she is "3 months pregnant..." and has "10 kids..."  Pt denies ETOH/drugs and reports that she is suicidal "that's why I'm here..."  Pt denied AVH at this time and reports that she is suppose to be taking geodon, but does not take medications.

## 2016-06-02 NOTE — ED Notes (Signed)
Dr Akintyo and Jamison DNP into see 

## 2016-06-02 NOTE — ED Notes (Signed)
Up tot he bathroom to shower and change scrubs 

## 2016-06-02 NOTE — ED Notes (Signed)
Sitting quielty in room 

## 2016-06-02 NOTE — ED Notes (Signed)
Up in hall yelling at other pt, difficult to redirect demanding to go home.  Security and [employer] present to assist, pt returned to room.

## 2016-06-02 NOTE — ED Notes (Signed)
Pt walking in hall, then walked into other pt's room.  Pt redirected and instructed not to enter other's rooms.  Up in hall talking to other pt.

## 2016-06-02 NOTE — ED Notes (Addendum)
Pt on the phone talking loudly/yelling/ cursing, call ended.

## 2016-06-02 NOTE — ED Notes (Addendum)
Up to the bathroom.  Pt continues to be loud, disruptive, cursing/yelling when anyone attempts to talk with her, declines VS.  While in the bathroom, pt got into the bathroom w/o notifying staff.  While in the shower the pt pulled the emergency call light repeatedly demanding various things.

## 2016-06-02 NOTE — ED Notes (Signed)
Up at the desk cursing/yelling at staff, and security, refusing to return to room

## 2016-06-02 NOTE — ED Notes (Addendum)
EDP updated will review chart

## 2016-06-02 NOTE — ED Notes (Addendum)
Pt to be dc'd per Catha Nottingham DNP.  Pt is aware of her dc, up at the desk yelling/cursing about not receiving soda or coffee with her meal.

## 2016-06-02 NOTE — Consult Note (Signed)
[place] [facility]iat[facility] Consult   Reason for Consult:  Cocaine abuse with agitation Referring Physician:  EDP Patient Identification: Christine Cobb MRN:  [id number] Principal Diagnosis: Cocaine abuse with cocaine-induced mood disorder [facility] Diagnosis:   Patient Active Problem List   Diagnosis Date Noted  . Cocaine abuse with cocaine-induced mood disorder [facility]) [F14.14] 06/02/2016    Priority: High  . Cocaine abuse [F14.10] 12/31/2014    Priority: High  . Schizoaffective disorder, bipolar type ([place]) [F25.0]   . Bipolar disorder, current episode manic without psychotic features, severe ([place]) [F31.13]   . Agitation [R45.1]   . Manic behavior ([place]) [F30.10]   . Hyperthyroidism [E05.90] 09/21/2013  . Marijuana abuse [F12.10] 04/12/2013  . History of CHF (congestive heart failure) [Z86.79] 02/01/2013  . Abscess of abdominal wall [L02.211] 10/18/2012  . HTN (hypertension) [I10] 04/08/2011  . Tachycardia [R00.0] 04/08/2011    Total Time spent with patient: 45 minutes  Subjective:   Christine Cobb is a 40 y.o. female patient does now warrant admission.  Dr. Dwyane Dee reviewed the patient and is in agreement with the discharge.  HPI:  40 yo female who presented to the ED by police for aggression, she was given PRN medications and is at her baseline.  She is demanding and loud.  The more attention is given to appease her many demands to more she makes.  When less attention is given, she is less agitated.  She appears to be at her baseline and PD [place] confirmed this is her baseline as he knows her well from the jail.  Evidently, she goes between the jail, the ED, and the streets where she abuses crack/cocaine.  When she gets agitated she is taken to jail or the ED.  Patient is disturbing other patients with no concerns for others.  She denies suicidal/homicidal ideations, hallucinations, and alcohol abuse.  Denies cocaine/crack withdrawal symptoms but appears to be craving.  She  is not interested in receiving substance or mental health assistance.   Past Psychiatric History: substance abuse, bipolar disorder  Risk to Self: No Risk to Others: No Prior Inpatient Therapy: Prior Inpatient Therapy: Yes Prior Therapy Dates:  ("yrs ago") Prior Therapy Facilty/Provider(s): [facility] Reason for Treatment:  (medication managment ) Prior Outpatient Therapy: Prior Outpatient Therapy: No Prior Therapy Dates:  (n/a) Prior Therapy Facilty/Provider(s):  (n/a) Reason for Treatment:  (n/a) Does patient have an ACCT team?: No Does patient have Intensive In-House Services?  : No Does patient have [facility] services? : No Does patient have P4CC services?: No  Past Medical History:  Past Medical History:  Diagnosis Date  . Arm pain   . Bipolar affective disorder, currently manic, mild ([place])   . CHF (congestive heart failure) ([place])   . Eye globe prosthesis   . HTN (hypertension)   . Hyperthyroidism   . Sinus tachycardia     Past Surgical History:  Procedure Laterality Date  . DILATION AND CURETTAGE OF UTERUS    . EYE SURGERY     Family History:  Family History  Problem Relation Age of Onset  . Hypertension Other   . Emphysema Other   . Asthma Son   . Diabetes Maternal Uncle   . Diabetes Paternal Grandmother    Family Psychiatric  History: none Social History:  History  Alcohol Use  . 0.0 oz/week    Comment: Last drink: 1/2 beer PTA     History  Drug Use  . Types: Marijuana    Comment: Pt sts "anything  and everything"    Social History   Social History  . Marital status: Married    Spouse name: N/A  . Number of children: N/A  . Years of education: N/A   Social History Main Topics  . Smoking status: Current Every Day Smoker    Types: Cigarettes    Last attempt to quit: 08/21/2013  . Smokeless tobacco: Never Used  . Alcohol use 0.0 oz/week     Comment: Last drink: 1/2 beer PTA  . Drug use:     Types: Marijuana     Comment: Pt sts "anything and  everything"  . Sexual activity: Yes    Birth control/ protection: Injection     Comment: crack cocaine   Other Topics Concern  . None   Social History Narrative  . None   Additional Social History:    Allergies:   Allergies  Allergen Reactions  . Amoxil [Amoxicillin] Anaphylaxis  . Trazodone And Nefazodone Anaphylaxis  . Ketorolac Tromethamine Hives and Swelling  . Prenatal [B-Plex Plus] Nausea Only  . Tegretol [Carbamazepine] Other (See Comments)    Swelling, skin peeling, skin discoloration  . B-Plex Plus Nausea Only    Prenatal   . Tegretol [Carbamazepine] Swelling and Other (See Comments)    Skin peeling Skin discoloring     Labs:  Results for orders placed or performed during the hospital encounter of 06/01/16 (from the past 48 hour(s))  Ethanol     Status: None   Collection Time: 06/01/16  2:25 PM  Result Value Ref Range   Alcohol, Ethyl (B) <5 <5 mg/dL    Comment:        LOWEST DETECTABLE LIMIT FOR SERUM ALCOHOL IS 5 mg/dL FOR MEDICAL PURPOSES ONLY   Comprehensive metabolic panel     Status: Abnormal   Collection Time: 06/01/16  2:28 PM  Result Value Ref Range   Sodium 140 135 - 145 mmol/L   Potassium 4.0 3.5 - 5.1 mmol/L   Chloride 107 101 - 111 mmol/L   CO2 25 22 - 32 mmol/L   Glucose, Bld 87 65 - 99 mg/dL   BUN 21 (H) 6 - 20 mg/dL   Creatinine, Ser 0.99 0.44 - 1.00 mg/dL   Calcium 9.3 8.9 - 10.3 mg/dL   Total Protein 7.8 6.5 - 8.1 g/dL   Albumin 4.4 3.5 - 5.0 g/dL   AST 20 15 - 41 U/L   ALT 14 14 - 54 U/L   Alkaline Phosphatase 62 38 - 126 U/L   Total Bilirubin 0.8 0.3 - 1.2 mg/dL   GFR calc non Af Amer >60 >60 mL/min   GFR calc Af Amer >60 >60 mL/min    Comment: (NOTE) The eGFR has been calculated using the CKD EPI equation. This calculation has not been validated in all clinical situations. eGFR's persistently <60 mL/min signify possible Chronic Kidney Disease.    Anion gap 8 5 - 15  CBC with Diff     Status: None   Collection Time:  06/01/16  2:28 PM  Result Value Ref Range   WBC 5.4 4.0 - 10.5 K/uL   RBC 5.08 3.87 - 5.11 MIL/uL   Hemoglobin 14.5 12.0 - 15.0 g/dL   HCT 43.0 36.0 - 46.0 %   MCV 84.6 78.0 - 100.0 fL   MCH 28.5 26.0 - 34.0 pg   MCHC 33.7 30.0 - 36.0 g/dL   RDW 13.7 11.5 - 15.5 %   Platelets 302 150 - 400 K/uL   Neutrophils  Relative % 45 %   Neutro Abs 2.4 1.7 - 7.7 K/uL   Lymphocytes Relative 41 %   Lymphs Abs 2.2 0.7 - 4.0 K/uL   Monocytes Relative 11 %   Monocytes Absolute 0.6 0.1 - 1.0 K/uL   Eosinophils Relative 2 %   Eosinophils Absolute 0.1 0.0 - 0.7 K/uL   Basophils Relative 1 %   Basophils Absolute 0.1 0.0 - 0.1 K/uL  I-Stat beta hCG blood, ED     Status: None   Collection Time: 06/01/16  2:42 PM  Result Value Ref Range   I-stat hCG, quantitative <5.0 <5 mIU/mL   Comment 3            Comment:   GEST. AGE      CONC.  (mIU/mL)   <=1 WEEK        5 - 50     2 WEEKS       50 - 500     3 WEEKS       100 - 10,000     4 WEEKS     1,000 - 30,000        FEMALE AND NON-PREGNANT FEMALE:     LESS THAN 5 mIU/mL     Current Facility-Administered Medications  Medication Dose Route Frequency Provider Last Rate Last Dose  . acetaminophen (TYLENOL) tablet 650 mg  650 mg Oral Q4H PRN Recardo Evangelist, PA-C      . chlorproMAZINE (THORAZINE) tablet 50 mg  50 mg Oral Once Patrecia Pour, NP       Current Outpatient Prescriptions  Medication Sig Dispense Refill  . amantadine (SYMMETREL) 100 MG capsule Take 1 capsule (100 mg total) by mouth 2 (two) times daily. 60 capsule 0  . hydrOXYzine (VISTARIL) 25 MG capsule Take 1 capsule (25 mg total) by mouth 3 (three) times daily as needed for anxiety. 30 capsule 0  . mirtazapine (REMERON) 15 MG tablet Take 1 tablet (15 mg total) by mouth at bedtime. 30 tablet 0  . temazepam (RESTORIL) 7.5 MG capsule Take 1 capsule (7.5 mg total) by mouth at bedtime as needed for sleep. 30 capsule 0  . ziprasidone (GEODON) 60 MG capsule Take 1 capsule (60 mg total) by mouth 2  (two) times daily with a meal. 60 capsule 0    Musculoskeletal: Strength & Muscle Tone: within normal limits Gait & Station: normal Patient leans: N/A  Psychiatric Specialty Exam: Physical Exam  Constitutional: She is oriented to person, place, and time. She appears well-developed and well-nourished.  HENT:  Head: Normocephalic.  Neck: Normal range of motion.  Respiratory: Effort normal.  Musculoskeletal: Normal range of motion.  Neurological: She is alert and oriented to person, place, and time.  Psychiatric: Her speech is normal. Judgment and thought content normal. Her affect is angry. Cognition and memory are normal. She is inattentive.    Review of Systems  Constitutional: Negative.   HENT: Negative.   Eyes: Negative.   Respiratory: Negative.   Cardiovascular: Negative.   Gastrointestinal: Negative.   Genitourinary: Negative.   Musculoskeletal: Negative.   Skin: Negative.   Neurological: Negative.   Endo/Heme/Allergies: Negative.   Psychiatric/Behavioral: Positive for substance abuse. The patient is nervous/anxious.     Blood pressure 117/76, pulse 97, temperature 98.9 F (37.2 C), temperature source Oral, resp. rate 19, SpO2 98 %, unknown if currently breastfeeding.There is no height or weight on file to calculate BMI.  General Appearance: Casual  Eye Contact:  Good  Speech:  Normal Rate  Volume:  Increased  Mood:  Angry  Affect:  Congruent  Thought Process:  Coherent and Descriptions of Associations: Intact  Orientation:  Full (Time, Place, and Person)  Thought Content:  WDL  Suicidal Thoughts:  No  Homicidal Thoughts:  No  Memory:  Immediate;   Fair Recent;   Fair Remote;   Fair  Judgement:  Fair  Insight:  Fair  Psychomotor Activity:  Normal  Concentration:  Concentration: Fair and Attention Span: Fair  Recall:  [employer] of Knowledge:  Fair  Language:  Fair  Akathisia:  No  Handed:  Right  AIMS (if indicated):     Assets:  Leisure Time Physical  Health Resilience  ADL's:  Intact  Cognition:  WNL  Sleep:        Treatment Plan Summary: Daily contact with patient to assess and evaluate symptoms and progress in treatment, Medication management and Plan cocaine abuse with cocaine induced mood disorder:  -Crisis stabilization -Medication management:  PRN medications given IM:  Geodon, Benadryl, Ativan, Thorazine for agitation -Individual and substance abuse counseling attempted  Disposition: No evidence of imminent risk to self or others at present.    Waylan Boga, NP 06/02/2016 1:03 PM  Patient seen face-to-face for psychiatric evaluation, chart reviewed and case discussed with the physician extender and developed treatment plan. Reviewed the information documented and agree with the treatment plan. Corena Pilgrim, MD

## 2016-06-02 NOTE — ED Notes (Addendum)
Out of the shower, remains difficult to redirect security here to assist.  Pt loud, cursing and verbally  threatening towards staff, uncooperative.

## 2016-06-02 NOTE — ED Notes (Addendum)
Up in hall, talking loudly/cursing, reports that she has not had breakfast, demanding food, talking with Simonne Martinet, security present.

## 2016-06-02 NOTE — ED Notes (Signed)
Catha Nottingham DNP over to talk w/ pt

## 2016-06-02 NOTE — ED Notes (Signed)
Pt medicated w/o difficulty after security assisted her back to her room.

## 2016-06-02 NOTE — Discharge Instructions (Signed)
We saw you in the ER for your mental health concerns and had our behavioral health team evaluate you. The team feels comfortable sending you home, please follow the recommendations given to you by them and the follow-up and medications they have prescribed to you. Please refrain from substance abuse. Return to the ER if your symptoms worsen.  

## 2016-06-02 NOTE — ED Notes (Signed)
PA Karleen Hampshire called for medication orders, Dr Nicanor Alcon called for Restraint Orders.

## 2016-06-02 NOTE — ED Notes (Signed)
Pt being verbally and physically aggressive towards staff members.  Pt redirected back to room, comfort measures given.  Security and [employer] at bedside for assistance.

## 2016-06-02 NOTE — ED Notes (Addendum)
Lt ankle restraint removed, pt agreed to cooperate, skin intact/circulation/movement intact , up to the desk, talkative, loud at times, redirectable. Then exposed her chest to staff.  Pt lowered shirt when directed to do so.

## 2016-06-02 NOTE — ED Notes (Addendum)
Pt alert/oriented x2, sts that she is the president. Sitter at bedside. Pt agreed to cooperate, lt wrist restraint removed.  Pt then removed rt wrist.  PO fluid offered. Pt reports that she does not take medications and has not taken then for a long time.  Skin intact, circulation/movement intact.

## 2016-06-02 NOTE — ED Notes (Signed)
Up at desk continues to report she did not get breakfast to eat, demanding food.

## 2016-06-02 NOTE — ED Notes (Addendum)
IVC has been rescinded. Attempted to review dc instructions with patient.  Pt declined to review information.   Pt also declined to allow her VS to be taken or sign belongings on dc.   Bus pass given, security and [employer] here to walk pt to dc area.  Pt ambulatory w/o difficulty w/ security and [employer] to the dc area Pt talking loudly, yelling and cursing as she left the unit. Belongings given to security.

## 2016-08-03 ENCOUNTER — Encounter (HOSPITAL_COMMUNITY): Payer: Self-pay

## 2016-08-03 ENCOUNTER — Emergency Department (HOSPITAL_COMMUNITY)
Admission: EM | Admit: 2016-08-03 | Discharge: 2016-08-03 | Disposition: A | Discharge status: Home / Self Care | Payer: Medicare Other | Attending: Emergency Medicine | Admitting: Emergency Medicine

## 2016-08-03 DIAGNOSIS — I110 Hypertensive heart disease with heart failure: Secondary | ICD-10-CM | POA: Insufficient documentation

## 2016-08-03 DIAGNOSIS — S0993XA Unspecified injury of face, initial encounter: Secondary | ICD-10-CM | POA: Diagnosis present

## 2016-08-03 DIAGNOSIS — S0083XA Contusion of other part of head, initial encounter: Secondary | ICD-10-CM | POA: Diagnosis not present

## 2016-08-03 DIAGNOSIS — I509 Heart failure, unspecified: Secondary | ICD-10-CM | POA: Diagnosis not present

## 2016-08-03 DIAGNOSIS — Y929 Unspecified place or not applicable: Secondary | ICD-10-CM | POA: Insufficient documentation

## 2016-08-03 DIAGNOSIS — F17210 Nicotine dependence, cigarettes, uncomplicated: Secondary | ICD-10-CM | POA: Insufficient documentation

## 2016-08-03 DIAGNOSIS — Y939 Activity, unspecified: Secondary | ICD-10-CM | POA: Diagnosis not present

## 2016-08-03 DIAGNOSIS — Y999 Unspecified external cause status: Secondary | ICD-10-CM | POA: Insufficient documentation

## 2016-08-03 DIAGNOSIS — F0391 Unspecified dementia with behavioral disturbance: Secondary | ICD-10-CM | POA: Diagnosis not present

## 2016-08-03 DIAGNOSIS — F29 Unspecified psychosis not due to a substance or known physiological condition: Secondary | ICD-10-CM | POA: Diagnosis not present

## 2016-08-03 NOTE — ED Notes (Signed)
PT REFUSED TO HAVE LABS DRAWN. ERA MADE AWARE.

## 2016-08-03 NOTE — ED Notes (Signed)
PT DISCHARGED. INSTRUCTIONS GIVEN. AAOX4. PT IN NO APPARENT DISTRESS OR PAIN. THE OPPORTUNITY TO ASK QUESTIONS WAS PROVIDED. PT REFUSES TO LEAVE THE ER UNTIL SHE EATS HER SANDWICH AND DRINKS HER SODA. SECURITY AND [employer] AT THE BEDSIDE. PT ALSO USING OBSCENE LANGUAGE AND UNDRESSING HERSELF IN THE HALLWAY. [employer] ESCORTED THE PT TO THE WAITING AREA.

## 2016-08-03 NOTE — ED Notes (Signed)
Bed: [facility] Expected date:  Expected time:  Means of arrival:  Comments: 
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
## 2016-08-03 NOTE — Discharge Instructions (Signed)
Take Tylenol as needed for pain.
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
## 2016-10-22 ENCOUNTER — Emergency Department (HOSPITAL_COMMUNITY)
Admission: EM | Admit: 2016-10-22 | Discharge: 2016-10-22 | Disposition: A | Discharge status: Home / Self Care | Payer: Medicare Other | Attending: Emergency Medicine | Admitting: Emergency Medicine

## 2016-10-22 ENCOUNTER — Encounter (HOSPITAL_COMMUNITY): Payer: Self-pay | Admitting: Emergency Medicine

## 2016-10-22 DIAGNOSIS — I509 Heart failure, unspecified: Secondary | ICD-10-CM | POA: Diagnosis not present

## 2016-10-22 DIAGNOSIS — F10120 Alcohol abuse with intoxication, uncomplicated: Secondary | ICD-10-CM | POA: Diagnosis not present

## 2016-10-22 DIAGNOSIS — F17210 Nicotine dependence, cigarettes, uncomplicated: Secondary | ICD-10-CM | POA: Insufficient documentation

## 2016-10-22 DIAGNOSIS — F1910 Other psychoactive substance abuse, uncomplicated: Secondary | ICD-10-CM

## 2016-10-22 DIAGNOSIS — R58 Hemorrhage, not elsewhere classified: Secondary | ICD-10-CM | POA: Diagnosis not present

## 2016-10-22 DIAGNOSIS — Z79899 Other long term (current) drug therapy: Secondary | ICD-10-CM | POA: Insufficient documentation

## 2016-10-22 DIAGNOSIS — F1210 Cannabis abuse, uncomplicated: Secondary | ICD-10-CM | POA: Insufficient documentation

## 2016-10-22 DIAGNOSIS — I110 Hypertensive heart disease with heart failure: Secondary | ICD-10-CM | POA: Diagnosis not present

## 2016-10-22 DIAGNOSIS — F1410 Cocaine abuse, uncomplicated: Secondary | ICD-10-CM | POA: Diagnosis not present

## 2016-10-22 DIAGNOSIS — F99 Mental disorder, not otherwise specified: Secondary | ICD-10-CM | POA: Diagnosis not present

## 2016-10-22 LAB — CBG MONITORING, ED: Glucose-Capillary: 105 mg/dL — ABNORMAL HIGH (ref 65–99)

## 2016-10-22 LAB — ACETAMINOPHEN LEVEL

## 2016-10-22 LAB — COMPREHENSIVE METABOLIC PANEL
ALK PHOS: 70 U/L (ref 38–126)
ALT: 16 U/L (ref 14–54)
ANION GAP: 9 (ref 5–15)
AST: 25 U/L (ref 15–41)
Albumin: 4.3 g/dL (ref 3.5–5.0)
BUN: 19 mg/dL (ref 6–20)
CALCIUM: 9.6 mg/dL (ref 8.9–10.3)
CO2: 24 mmol/L (ref 22–32)
Chloride: 107 mmol/L (ref 101–111)
Creatinine, Ser: 0.99 mg/dL (ref 0.44–1.00)
GFR calc non Af Amer: >60 mL/min (ref >60)
Glucose, Bld: 62 mg/dL — ABNORMAL LOW (ref 65–99)
Potassium: 4 mmol/L (ref 3.5–5.1)
SODIUM: 140 mmol/L (ref 135–145)
TOTAL PROTEIN: 7.9 g/dL (ref 6.5–8.1)
Total Bilirubin: 0.7 mg/dL (ref 0.3–1.2)

## 2016-10-22 LAB — RAPID URINE DRUG SCREEN, HOSP PERFORMED
Amphetamines: NOT DETECTED
BENZODIAZEPINES: NOT DETECTED
Barbiturates: NOT DETECTED
Cocaine: POSITIVE — AB
Opiates: NOT DETECTED
Tetrahydrocannabinol: POSITIVE — AB

## 2016-10-22 LAB — CBC
HCT: 41 % (ref 36.0–46.0)
HEMOGLOBIN: 13.6 g/dL (ref 12.0–15.0)
MCH: 28.6 pg (ref 26.0–34.0)
MCHC: 33.2 g/dL (ref 30.0–36.0)
MCV: 86.3 fL (ref 78.0–100.0)
PLATELETS: 290 10*3/uL (ref 150–400)
RBC: 4.75 MIL/uL (ref 3.87–5.11)
RDW: 14.9 % (ref 11.5–15.5)
WBC: 8.3 10*3/uL (ref 4.0–10.5)

## 2016-10-22 LAB — I-STAT CHEM 8, ED
BUN: 24 mg/dL — ABNORMAL HIGH (ref 6–20)
Calcium, Ion: 0.99 mmol/L — ABNORMAL LOW (ref 1.15–1.40)
Chloride: 110 mmol/L (ref 101–111)
Creatinine, Ser: 0.9 mg/dL (ref 0.44–1.00)
Glucose, Bld: 60 mg/dL — ABNORMAL LOW (ref 65–99)
HEMATOCRIT: 44 % (ref 36.0–46.0)
HEMOGLOBIN: 15 g/dL (ref 12.0–15.0)
Potassium: 4.2 mmol/L (ref 3.5–5.1)
SODIUM: 139 mmol/L (ref 135–145)
TCO2: 22 mmol/L (ref 0–100)

## 2016-10-22 LAB — ETHANOL

## 2016-10-22 LAB — SALICYLATE LEVEL

## 2016-10-22 LAB — PREGNANCY, URINE: Preg Test, Ur: NEGATIVE

## 2016-10-22 MED ORDER — ZIPRASIDONE MESYLATE 20 MG IM SOLR
20.0000 mg | Freq: Once | INTRAMUSCULAR | Status: AC
  Administered 2016-10-22: 20 mg via INTRAMUSCULAR
  Filled 2016-10-22: qty 20

## 2016-10-22 MED ORDER — STERILE WATER FOR INJECTION IJ SOLN
INTRAMUSCULAR | Status: AC
  Administered 2016-10-22: 1.2 mL
  Filled 2016-10-22: qty 10

## 2016-10-22 MED ORDER — HALOPERIDOL LACTATE 5 MG/ML IJ SOLN
5.0000 mg | Freq: Once | INTRAMUSCULAR | Status: AC
  Administered 2016-10-22: 5 mg via INTRAMUSCULAR
  Filled 2016-10-22: qty 1

## 2016-10-22 MED ORDER — LORAZEPAM 2 MG/ML IJ SOLN
2.0000 mg | Freq: Once | INTRAMUSCULAR | Status: AC
  Administered 2016-10-22: 2 mg via INTRAMUSCULAR
  Filled 2016-10-22: qty 1

## 2016-10-22 NOTE — ED Notes (Signed)
Patient still in room arguing with staff and refuses to leave room. Instructed to get dressed.  Patient just standing in room not getting dressed.

## 2016-10-22 NOTE — ED Provider Notes (Signed)
Patient alert and ambulatory at this time. Had hypoglycemia which is treated with food which she ate without difficulty. She is stable for discharge   Lorre Nick, MD 10/22/16 1238

## 2016-10-22 NOTE — ED Notes (Signed)
BLOOD COLLECTION UNSUCCESSFUL DUE TO PATIENT PULLING AWAY AND REFUSING TO COOPERATE.

## 2016-10-22 NOTE — ED Notes (Signed)
Pt CBG was found to be 60. Dr Freida Busman notified and sts to feed pt. Pt drank 2 cartons of juice, ate 1/2 piece of sausage and a few bites of eggs. Pt fell back to sleep shortly after. Pt being monitored and in NAD

## 2016-10-22 NOTE — ED Notes (Signed)
Patient refusing to leave property.  Patient pulled glucose strip out of her bra.  Security at the bedside.  Patient to be escorted off property per CN-currently trespassing.
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
## 2016-10-22 NOTE — ED Notes (Signed)
Pt sleeping quietly at this time with even, unlabored resp. Will continue to monitor 

## 2016-10-22 NOTE — ED Notes (Addendum)
Patient will not let anybody do anything for her. Patient refuses blood.

## 2016-10-22 NOTE — ED Provider Notes (Addendum)
[facility]-EMERGENCY DEPT Provider Note   CSN: [id number] Arrival date & time: 10/22/16  0425     History   Chief Complaint Chief Complaint  Patient presents with  . Medical Clearance    HPI Christine Cobb is a 41 y.o. female.  The history is provided by the EMS personnel. The history is limited by the condition of the patient.  Drug / Alcohol Assessment  Primary symptoms include intoxication.  Primary symptoms include no confusion, no somnolence, no loss of consciousness, no hallucinations, no self-injury. This is a recurrent problem. Episode onset: unknown. The problem has not changed since onset.Suspected agents include cocaine. Pertinent negatives include no nausea and no vomiting. Associated medical issues include mental illness. Associated medical issues do not include withdrawal syndrome.    Past Medical History:  Diagnosis Date  . Arm pain   . Bipolar affective disorder, currently manic, mild (HCC)   . CHF (congestive heart failure) (HCC)   . Eye globe prosthesis   . HTN (hypertension)   . Hyperthyroidism   . Sinus tachycardia     Patient Active Problem List   Diagnosis Date Noted  . Cocaine abuse with cocaine-induced mood disorder (HCC) 06/02/2016  . Schizoaffective disorder, bipolar type (HCC)   . Cocaine abuse 12/31/2014  . Bipolar disorder, current episode manic without psychotic features, severe (HCC)   . Agitation   . Manic behavior (HCC)   . Hyperthyroidism 09/21/2013  . Marijuana abuse 04/12/2013  . History of CHF (congestive heart failure) 02/01/2013  . Abscess of abdominal wall 10/18/2012  . HTN (hypertension) 04/08/2011  . Tachycardia 04/08/2011    Past Surgical History:  Procedure Laterality Date  . DILATION AND CURETTAGE OF UTERUS    . EYE SURGERY      OB History    Gravida Para Term Preterm AB Living   4 2 2   1 2    SAB TAB Ectopic Multiple Live Births   1 0     2       Home Medications    Prior to Admission medications     Medication Sig Start Date End Date Taking? Authorizing Provider  amantadine (SYMMETREL) 100 MG capsule Take 1 capsule (100 mg total) by mouth 2 (two) times daily. 04/21/16   Charm Rings, NP  hydrOXYzine (VISTARIL) 25 MG capsule Take 1 capsule (25 mg total) by mouth 3 (three) times daily as needed for anxiety. 03/29/16   Azalia Bilis, MD  mirtazapine (REMERON) 15 MG tablet Take 1 tablet (15 mg total) by mouth at bedtime. 04/21/16   Charm Rings, NP  temazepam (RESTORIL) 7.5 MG capsule Take 1 capsule (7.5 mg total) by mouth at bedtime as needed for sleep. 03/29/16   Azalia Bilis, MD  ziprasidone (GEODON) 60 MG capsule Take 1 capsule (60 mg total) by mouth 2 (two) times daily with a meal. 03/29/16   Azalia Bilis, MD    Family History Family History  Problem Relation Age of Onset  . Hypertension Other   . Emphysema Other   . Asthma Son   . Diabetes Maternal Uncle   . Diabetes Paternal Grandmother     Social History Social History  Substance Use Topics  . Smoking status: Current Every Day Smoker    Types: Cigarettes    Last attempt to quit: 08/21/2013  . Smokeless tobacco: Never Used  . Alcohol use 0.0 oz/week     Comment: Last drink: 1/2 beer PTA     Allergies   Amoxil [amoxicillin];  Trazodone and nefazodone; Ketorolac tromethamine; Prenatal [b-plex plus]; Tegretol [carbamazepine]; B-plex plus; and Tegretol [carbamazepine]   Review of Systems Review of Systems  Gastrointestinal: Negative for nausea and vomiting.  Neurological: Negative for loss of consciousness.  Psychiatric/Behavioral: Negative for confusion, hallucinations and self-injury.  All other systems reviewed and are negative.    Physical Exam Updated Vital Signs LMP 10/22/2016   Breastfeeding? No   Physical Exam  Constitutional: She appears well-developed and well-nourished. No distress.  HENT:  Head: Normocephalic and atraumatic.  Mouth/Throat: No oropharyngeal exudate.  Eyes: Right eye exhibits no  discharge. No scleral icterus.  Clouded cornea   Neck: Normal range of motion. Neck supple. No JVD present.  Cardiovascular: Normal rate, regular rhythm, normal heart sounds and intact distal pulses.   Pulmonary/Chest: Effort normal and breath sounds normal. No stridor. She has no wheezes. She has no rales.  Abdominal: Soft. Bowel sounds are normal. She exhibits no mass. There is no tenderness. There is no rebound and no guarding.  Musculoskeletal: Normal range of motion. She exhibits no edema.  Neurological: She is alert. She displays normal reflexes.  Skin: Skin is warm and dry. Capillary refill takes less than 2 seconds.  Psychiatric: Her speech is rapid and/or pressured and tangential. She expresses impulsivity. She expresses no suicidal plans and no homicidal plans.     ED Treatments / Results   Vitals:   10/22/16 0721  BP: 113/80  Pulse: 90  Resp: 18  Temp: (!) 96.9 F (36.1 C)   Results for orders placed or performed during the hospital encounter of 10/22/16  Urine rapid drug screen (hosp performed)not at [facility]  Result Value Ref Range   Opiates NONE DETECTED NONE DETECTED   Cocaine POSITIVE (A) NONE DETECTED   Benzodiazepines NONE DETECTED NONE DETECTED   Amphetamines NONE DETECTED NONE DETECTED   Tetrahydrocannabinol POSITIVE (A) NONE DETECTED   Barbiturates NONE DETECTED NONE DETECTED  Pregnancy, urine  Result Value Ref Range   Preg Test, Ur NEGATIVE NEGATIVE   No results found.   Procedures Procedures (including critical care time)  Medications Ordered in ED Medications  ziprasidone (GEODON) injection 20 mg (20 mg Intramuscular Given 10/22/16 0530)  sterile water (preservative free) injection (1.2 mLs  Given 10/22/16 0530)  haloperidol lactate (HALDOL) injection 5 mg (5 mg Intramuscular Given 10/22/16 0559)  LORazepam (ATIVAN) injection 2 mg (2 mg Intramuscular Given 10/22/16 0559)  haloperidol lactate (HALDOL) injection 5 mg (5 mg Intramuscular Given 10/22/16  3276)     Final Clinical Impressions(s) / ED Diagnoses  Polysubstance abuse: signed out to am attending, Dr. Freida Busman pending sober up and reevaluation.  Patient is here voluntarily at this time and has denied SI and HI.         Cy Blamer, MD 10/22/16 [phone]

## 2016-10-22 NOTE — ED Triage Notes (Signed)
Patient states that she is having a complete break down. Patient is not making sense when she is talking.

## 2016-10-22 NOTE — ED Notes (Signed)
Tried to get blood and patient is refusing blood be drawn.

## 2017-04-11 DIAGNOSIS — S0572XD Avulsion of left eye, subsequent encounter: Secondary | ICD-10-CM | POA: Diagnosis not present

## 2017-04-26 ENCOUNTER — Encounter (HOSPITAL_COMMUNITY): Payer: Self-pay | Admitting: Student

## 2017-04-26 ENCOUNTER — Inpatient Hospital Stay (HOSPITAL_COMMUNITY)
Admission: AD | Admit: 2017-04-26 | Discharge: 2017-04-26 | Disposition: A | Discharge status: Home / Self Care | Payer: Medicare Other | Source: Ambulatory Visit | Attending: Obstetrics and Gynecology | Admitting: Obstetrics and Gynecology

## 2017-04-26 DIAGNOSIS — F319 Bipolar disorder, unspecified: Secondary | ICD-10-CM | POA: Insufficient documentation

## 2017-04-26 DIAGNOSIS — Z880 Allergy status to penicillin: Secondary | ICD-10-CM | POA: Insufficient documentation

## 2017-04-26 DIAGNOSIS — F17210 Nicotine dependence, cigarettes, uncomplicated: Secondary | ICD-10-CM | POA: Diagnosis not present

## 2017-04-26 DIAGNOSIS — I110 Hypertensive heart disease with heart failure: Secondary | ICD-10-CM | POA: Diagnosis not present

## 2017-04-26 DIAGNOSIS — A5901 Trichomonal vulvovaginitis: Secondary | ICD-10-CM

## 2017-04-26 DIAGNOSIS — N898 Other specified noninflammatory disorders of vagina: Secondary | ICD-10-CM | POA: Diagnosis present

## 2017-04-26 DIAGNOSIS — Z79899 Other long term (current) drug therapy: Secondary | ICD-10-CM | POA: Insufficient documentation

## 2017-04-26 DIAGNOSIS — Z825 Family history of asthma and other chronic lower respiratory diseases: Secondary | ICD-10-CM | POA: Diagnosis not present

## 2017-04-26 DIAGNOSIS — I509 Heart failure, unspecified: Secondary | ICD-10-CM | POA: Insufficient documentation

## 2017-04-26 DIAGNOSIS — Z888 Allergy status to other drugs, medicaments and biological substances status: Secondary | ICD-10-CM | POA: Insufficient documentation

## 2017-04-26 DIAGNOSIS — Z3202 Encounter for pregnancy test, result negative: Secondary | ICD-10-CM | POA: Diagnosis not present

## 2017-04-26 LAB — URINALYSIS, ROUTINE W REFLEX MICROSCOPIC
BILIRUBIN URINE: NEGATIVE
Glucose, UA: NEGATIVE mg/dL
KETONES UR: NEGATIVE mg/dL
Nitrite: NEGATIVE
PH: 6 (ref 5.0–8.0)
Protein, ur: NEGATIVE mg/dL
SPECIFIC GRAVITY, URINE: 1.006 (ref 1.005–1.030)

## 2017-04-26 LAB — WET PREP, GENITAL
SPERM: NONE SEEN
Yeast Wet Prep HPF POC: NONE SEEN

## 2017-04-26 LAB — POCT PREGNANCY, URINE: Preg Test, Ur: NEGATIVE

## 2017-04-26 MED ORDER — METRONIDAZOLE 500 MG PO TABS: 2000.0000 mg | ORAL_TABLET | Freq: Once | ORAL | 0 refills | Status: AC

## 2017-04-26 MED ORDER — METRONIDAZOLE 500 MG PO TABS: 2000.0000 mg | ORAL_TABLET | Freq: Once | ORAL | Status: DC

## 2017-04-26 MED ORDER — METRONIDAZOLE 500 MG PO TABS: 2000.0000 mg | ORAL_TABLET | Freq: Once | ORAL | 0 refills | Status: DC
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
## 2017-04-26 NOTE — Discharge Instructions (Signed)
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
## 2017-04-28 ENCOUNTER — Emergency Department (HOSPITAL_COMMUNITY)
Admission: EM | Admit: 2017-04-28 | Discharge: 2017-04-30 | Disposition: A | Discharge status: Other Acute Inpatient Hospital | Payer: Medicare Other | Attending: Emergency Medicine | Admitting: Emergency Medicine

## 2017-04-28 ENCOUNTER — Encounter (HOSPITAL_COMMUNITY): Payer: Self-pay | Admitting: Nurse Practitioner

## 2017-04-28 DIAGNOSIS — Z79899 Other long term (current) drug therapy: Secondary | ICD-10-CM | POA: Diagnosis not present

## 2017-04-28 DIAGNOSIS — R451 Restlessness and agitation: Secondary | ICD-10-CM | POA: Diagnosis not present

## 2017-04-28 DIAGNOSIS — F1410 Cocaine abuse, uncomplicated: Secondary | ICD-10-CM | POA: Diagnosis not present

## 2017-04-28 DIAGNOSIS — Z046 Encounter for general psychiatric examination, requested by authority: Secondary | ICD-10-CM | POA: Diagnosis not present

## 2017-04-28 DIAGNOSIS — R9431 Abnormal electrocardiogram [ECG] [EKG]: Secondary | ICD-10-CM | POA: Diagnosis not present

## 2017-04-28 DIAGNOSIS — R6889 Other general symptoms and signs: Secondary | ICD-10-CM | POA: Diagnosis not present

## 2017-04-28 DIAGNOSIS — F29 Unspecified psychosis not due to a substance or known physiological condition: Secondary | ICD-10-CM | POA: Diagnosis not present

## 2017-04-28 DIAGNOSIS — F3113 Bipolar disorder, current episode manic without psychotic features, severe: Secondary | ICD-10-CM | POA: Diagnosis present

## 2017-04-28 DIAGNOSIS — F17210 Nicotine dependence, cigarettes, uncomplicated: Secondary | ICD-10-CM | POA: Diagnosis not present

## 2017-04-28 DIAGNOSIS — I110 Hypertensive heart disease with heart failure: Secondary | ICD-10-CM | POA: Diagnosis not present

## 2017-04-28 DIAGNOSIS — I509 Heart failure, unspecified: Secondary | ICD-10-CM | POA: Insufficient documentation

## 2017-04-28 DIAGNOSIS — F3189 Other bipolar disorder: Secondary | ICD-10-CM

## 2017-04-28 DIAGNOSIS — F309 Manic episode, unspecified: Secondary | ICD-10-CM | POA: Insufficient documentation

## 2017-04-28 DIAGNOSIS — R404 Transient alteration of awareness: Secondary | ICD-10-CM | POA: Diagnosis not present

## 2017-04-28 LAB — URINALYSIS, ROUTINE W REFLEX MICROSCOPIC
Bilirubin Urine: NEGATIVE
GLUCOSE, UA: NEGATIVE mg/dL
Ketones, ur: NEGATIVE mg/dL
NITRITE: NEGATIVE
PH: 6 (ref 5.0–8.0)
PROTEIN: NEGATIVE mg/dL
SPECIFIC GRAVITY, URINE: 1.008 (ref 1.005–1.030)

## 2017-04-28 LAB — GC/CHLAMYDIA PROBE AMP (~~LOC~~) NOT AT ARMC
Chlamydia: NEGATIVE
NEISSERIA GONORRHEA: NEGATIVE

## 2017-04-28 LAB — BASIC METABOLIC PANEL
Anion gap: 11 (ref 5–15)
BUN: 15 mg/dL (ref 6–20)
CO2: 23 mmol/L (ref 22–32)
Calcium: 9.7 mg/dL (ref 8.9–10.3)
Chloride: 108 mmol/L (ref 101–111)
Creatinine, Ser: 1.17 mg/dL — ABNORMAL HIGH (ref 0.44–1.00)
GFR, EST NON AFRICAN AMERICAN: 58 mL/min — AB (ref >60)
Glucose, Bld: 94 mg/dL (ref 65–99)
POTASSIUM: 3.7 mmol/L (ref 3.5–5.1)
SODIUM: 142 mmol/L (ref 135–145)

## 2017-04-28 LAB — CBC WITH DIFFERENTIAL/PLATELET
BASOS ABS: 0.1 10*3/uL (ref 0.0–0.1)
Basophils Relative: 1 %
EOS PCT: 1 %
Eosinophils Absolute: 0.1 10*3/uL (ref 0.0–0.7)
HCT: 38.4 % (ref 36.0–46.0)
Hemoglobin: 13 g/dL (ref 12.0–15.0)
LYMPHS PCT: 24 %
Lymphs Abs: 1.8 10*3/uL (ref 0.7–4.0)
MCH: 28.4 pg (ref 26.0–34.0)
MCHC: 33.9 g/dL (ref 30.0–36.0)
MCV: 84 fL (ref 78.0–100.0)
Monocytes Absolute: 0.6 10*3/uL (ref 0.1–1.0)
Monocytes Relative: 7 %
NEUTROS ABS: 5 10*3/uL (ref 1.7–7.7)
Neutrophils Relative %: 67 %
PLATELETS: 290 10*3/uL (ref 150–400)
RBC: 4.57 MIL/uL (ref 3.87–5.11)
RDW: 13.8 % (ref 11.5–15.5)
WBC: 7.5 10*3/uL (ref 4.0–10.5)

## 2017-04-28 LAB — RAPID URINE DRUG SCREEN, HOSP PERFORMED
AMPHETAMINES: NOT DETECTED
BARBITURATES: NOT DETECTED
BENZODIAZEPINES: NOT DETECTED
Cocaine: NOT DETECTED
Opiates: NOT DETECTED
TETRAHYDROCANNABINOL: NOT DETECTED

## 2017-04-28 LAB — I-STAT BETA HCG BLOOD, ED (MC, WL, AP ONLY): I-stat hCG, quantitative: <5 m[IU]/mL (ref <5)

## 2017-04-28 LAB — SALICYLATE LEVEL: Salicylate Lvl: <7 mg/dL (ref 2.8–30.0)

## 2017-04-28 LAB — PREGNANCY, URINE: Preg Test, Ur: NEGATIVE

## 2017-04-28 LAB — ACETAMINOPHEN LEVEL

## 2017-04-28 LAB — ETHANOL: Alcohol, Ethyl (B): <10 mg/dL (ref <10)

## 2017-04-28 MED ORDER — SODIUM CHLORIDE 0.9 % IJ SOLN
INTRAMUSCULAR | Status: AC
  Administered 2017-04-28: 1.2 mL
  Filled 2017-04-28: qty 10

## 2017-04-28 MED ORDER — METRONIDAZOLE 500 MG PO TABS
2000.0000 mg | ORAL_TABLET | Freq: Once | ORAL | Status: AC
  Administered 2017-04-28: 2000 mg via ORAL
  Filled 2017-04-28: qty 4

## 2017-04-28 MED ORDER — STERILE WATER FOR INJECTION IJ SOLN
INTRAMUSCULAR | Status: AC
  Administered 2017-04-28: 1.2 mL
  Filled 2017-04-28: qty 10

## 2017-04-28 MED ORDER — TRAZODONE HCL 100 MG PO TABS
200.0000 mg | ORAL_TABLET | Freq: Every day | ORAL | Status: DC
Start: 2017-04-28 — End: 2017-04-28

## 2017-04-28 MED ORDER — HYDROXYZINE PAMOATE 25 MG PO CAPS
25.0000 mg | ORAL_CAPSULE | Freq: Three times a day (TID) | ORAL | Status: DC | PRN
  Filled 2017-04-28: qty 1

## 2017-04-28 MED ORDER — ZIPRASIDONE MESYLATE 20 MG IM SOLR
10.0000 mg | Freq: Once | INTRAMUSCULAR | Status: AC
  Administered 2017-04-28: 10 mg via INTRAMUSCULAR
  Filled 2017-04-28: qty 20

## 2017-04-28 MED ORDER — MIRTAZAPINE 30 MG PO TABS: 15.0000 mg | ORAL_TABLET | Freq: Every day | ORAL | Status: DC

## 2017-04-28 MED ORDER — ZIPRASIDONE HCL 20 MG PO CAPS
60.0000 mg | ORAL_CAPSULE | Freq: Two times a day (BID) | ORAL | Status: DC
Start: 2017-04-28 — End: 2017-04-30
  Administered 2017-04-29 – 2017-04-30 (×3): 60 mg via ORAL
  Filled 2017-04-28 (×5): qty 3

## 2017-04-28 NOTE — ED Notes (Signed)
Bed: WA25 Expected date:  Expected time:  Means of arrival:  Comments: EMS  
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
## 2017-04-28 NOTE — ED Notes (Addendum)
Pt has been sleeping, after arrival. Did not administered Geodon at 1700 r/t sleep.

## 2017-04-28 NOTE — ED Notes (Signed)
Report given to [facility] RN 
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
## 2017-04-28 NOTE — BH Assessment (Addendum)
Assessment Note  Christine Cobb is an 41 y.o. female who presents to the ED under IVC initiated by EDP. Pt initially BIB EMS due to "Patient was striping naked and verbally abusive." Pt appears to be in a manic state during the assessment. Pt unable to complete thought patterns and speaking in tangential thoughts. Pt began singing loudly during the assessment and making irrelevant statements such as "I'm just here for a sexual disease. I get checks for people that are legally blind. They are making me eat because of an STD. He got mad because I was not going to vote. I think Garnet Koyanagi should remain president. Now the whole world is in my business. Me and Ray got a son and I want a divorce." Pt continued rambling about incoherent subjects throughout the assessment. Pt has a hx of Bipolar disorder and polysubstance abuse per IVC. Pt denies drug use during the assessment and labs are negative for substances at time of arrival to ED.  Pt was cooperative throughout assessment and answered questions when asked, however the responses to the questions asked were often irrelevant and tangential.   Per Nira Conn, NP pt meets criteria for inpt treatment. TTS to seek placement. Pt's nurse Rashell, RN has been notified.  Diagnosis: Bipolar II Disorder 296.89 (F31.81)    Past Medical History:  Past Medical History:  Diagnosis Date  . Arm pain   . Bipolar affective disorder, currently manic, mild (HCC)   . CHF (congestive heart failure) (HCC)   . Eye globe prosthesis   . HTN (hypertension)   . Hyperthyroidism   . Sinus tachycardia     Past Surgical History:  Procedure Laterality Date  . DILATION AND CURETTAGE OF UTERUS    . EYE SURGERY      Family History:  Family History  Problem Relation Age of Onset  . Hypertension Other   . Emphysema Other   . Asthma Son   . Diabetes Maternal Uncle   . Diabetes Paternal Grandmother     Social History:  reports that she has been smoking  Cigarettes.  She has never used smokeless tobacco. She reports that she drinks alcohol. She reports that she uses drugs, including Marijuana.  Additional Social History:  Alcohol / Drug Use Pain Medications: SEE MAR Prescriptions: SEE MAR Over the Counter: SEE MAR History of alcohol / drug use?: Yes (per chart, pt labs negative on arrival to ED) Longest period of sobriety (when/how long): 3 years (per chart)  CIWA: CIWA-Ar BP: 128/86 Pulse Rate: 89 COWS:    Allergies:  Allergies  Allergen Reactions  . Amoxil [Amoxicillin] Anaphylaxis  . Ketorolac Tromethamine Hives and Swelling  . Prenatal [B-Plex Plus] Nausea Only  . Tegretol [Carbamazepine] Other (See Comments)    Swelling, skin peeling, skin discoloration  . B-Plex Plus Nausea Only    Prenatal   . Tegretol [Carbamazepine] Swelling and Other (See Comments)    Skin peeling Skin discoloring     Home Medications:  (Not in a hospital admission)  OB/GYN Status:  No LMP recorded.  General Assessment Data Location of Assessment: [facility] ED TTS Assessment: In system Is this a Tele or Face-to-Face Assessment?: Face-to-Face Is this an Initial Assessment or a Re-assessment for this encounter?: Initial Assessment Marital status: Married Is patient pregnant?: No Pregnancy Status: No Living Arrangements: Children, Spouse/significant other Can pt return to current living arrangement?: Yes Admission Status: Involuntary Is patient capable of signing voluntary admission?: No Referral Source: Self/Family/Friend Insurance type: Medicare  Crisis Care Plan Living Arrangements: Children, Spouse/significant other Name of Psychiatrist: unknown Name of Therapist: unknown  Education Status Is patient currently in school?: No Highest grade of school patient has completed: unknown  Risk to self with the past 6 months Suicidal Ideation: No (pt denies) Has patient been a risk to self within the past 6 months prior to admission? :  No Suicidal Intent: No Has patient had any suicidal intent within the past 6 months prior to admission? : No Is patient at risk for suicide?: No Suicidal Plan?: No Has patient had any suicidal plan within the past 6 months prior to admission? : No Access to Means: No What has been your use of drugs/alcohol within the last 12 months?: UTA Previous Attempts/Gestures: No Triggers for Past Attempts: None known Intentional Self Injurious Behavior: None Family Suicide History: Unable to assess Recent stressful life event(s): Other (Comment) (pt currently experiencing a manic episode ) Persecutory voices/beliefs?: No Depression: No Depression Symptoms: Feeling angry/irritable Substance abuse history and/or treatment for substance abuse?:  (unknown) Suicide prevention information given to non-admitted patients: Not applicable  Risk to Others within the past 6 months Homicidal Ideation: No (pt denies) Does patient have any lifetime risk of violence toward others beyond the six months prior to admission? : No Thoughts of Harm to Others: No Current Homicidal Intent: No Current Homicidal Plan: No Access to Homicidal Means: No History of harm to others?: No Assessment of Violence: None Noted Does patient have access to weapons?: No Criminal Charges Pending?: No Does patient have a court date: No Is patient on probation?: No  Psychosis Hallucinations: None noted Delusions: Unspecified  Mental Status Report Appearance/Hygiene: Bizarre, Disheveled, In scrubs Eye Contact: Good Motor Activity: Freedom of movement Speech: Tangential, Rapid Level of Consciousness: Alert, Restless Mood: Anxious Affect: Anxious, Preoccupied Anxiety Level: Severe Thought Processes: Tangential, Irrelevant, Flight of Ideas Judgement: Impaired Orientation: Person, Place Obsessive Compulsive Thoughts/Behaviors: Minimal  Cognitive Functioning Concentration: Decreased Memory: Remote Impaired, Recent  Impaired IQ: Average Insight: Poor Impulse Control: Poor Appetite: Good Sleep: Unable to Assess Total Hours of Sleep:  (unknown) Vegetative Symptoms: Unable to Assess  ADLScreening [facility] Assessment Services) Patient's cognitive ability adequate to safely complete daily activities?: Yes Patient able to express need for assistance with ADLs?: Yes Independently performs ADLs?: Yes (appropriate for developmental age)  Prior Inpatient Therapy Prior Inpatient Therapy: Yes Prior Therapy Dates: 2014 Prior Therapy Facilty/Provider(s): [facility]  Reason for Treatment: Psychosis; Bipolar D/O  Prior Outpatient Therapy Prior Outpatient Therapy:  (unknown) Does patient have an ACCT team?: Unknown Does patient have Intensive In-House Services?  : Unknown Does patient have [facility] services? : Unknown Does patient have P4CC services?: Unknown  ADL Screening (condition at time of admission) Patient's cognitive ability adequate to safely complete daily activities?: Yes Is the patient deaf or have difficulty hearing?: No Does the patient have difficulty seeing, even when wearing glasses/contacts?: No Does the patient have difficulty concentrating, remembering, or making decisions?: Yes Patient able to express need for assistance with ADLs?: Yes Does the patient have difficulty dressing or bathing?: No Independently performs ADLs?: Yes (appropriate for developmental age) Does the patient have difficulty walking or climbing stairs?: No Weakness of Legs: None Weakness of Arms/Hands: None  Home Assistive Devices/Equipment Home Assistive Devices/Equipment: None    Abuse/Neglect Assessment (Assessment to be complete while patient is alone) Physical Abuse: Denies Verbal Abuse: Denies Sexual Abuse: Denies Exploitation of patient/patient's resources: Denies Self-Neglect: Denies     Merchant navy officerAdvance Directives (For Healthcare) Does Patient  Have a Medical Advance Directive?: No Would patient like  information on creating a medical advance directive?: No - Patient declined    Additional Information 1:1 In Past 12 Months?: No CIRT Risk: Yes Elopement Risk: No Does patient have medical clearance?: Yes     Disposition:  Disposition Initial Assessment Completed for this Encounter: Yes Disposition of Patient: Inpatient treatment program Type of inpatient treatment program: Adult (per Nira Conn, NP)  On Site Evaluation by:   Reviewed with Physician:    Karolee Ohs 04/28/2017 9:37 PM

## 2017-04-28 NOTE — ED Notes (Signed)
Unable to assess pt. Will attempt to reassess.

## 2017-04-28 NOTE — ED Provider Notes (Addendum)
[facility] Provider Note   CSN: [id number] Arrival date & time: 04/28/17  0807     History   Chief Complaint Chief Complaint  Patient presents with  . STI evaluation/Psych Eval    HPI Christine Cobb is a 41 y.o. female.  41 year old female presents for evaluation. She has a history of bipolar, mania, and polysubstance abuse (cocaine, THC). Upon initial evaluation, she is uncooperative. She is naked in her room and singing loudly at the top of her voice. When MD attempted to ask patient reason for her visit today, she got down on the floor and put her legs up in the air (still naked).   She appears to be manic. She appears to be an acute danger to herself. IVC papers initiated. Medical clearance workup initiated.    The history is provided by the patient. The history is limited by the condition of the patient.  Mental Health Problem  Presenting symptoms: agitation, bizarre behavior and disorganized thought process   Presenting symptoms comment:  Mania Degree of incapacity (severity):  Moderate Onset quality:  Unable to specify Duration: unable to determine. Timing:  Unable to specify Progression:  Unable to specify Chronicity:  New Context comment:  Uncertain Treatment compliance:  Unable to specify Relieved by:  Nothing Worsened by:  Nothing Risk factors: hx of mental illness     Past Medical History:  Diagnosis Date  . Arm pain   . Bipolar affective disorder, currently manic, mild (HCC)   . CHF (congestive heart failure) (HCC)   . Eye globe prosthesis   . HTN (hypertension)   . Hyperthyroidism   . Sinus tachycardia     Patient Active Problem List   Diagnosis Date Noted  . Cocaine abuse with cocaine-induced mood disorder (HCC) 06/02/2016  . Schizoaffective disorder, bipolar type (HCC)   . Cocaine abuse (HCC) 12/31/2014  . Bipolar disorder, current episode manic without psychotic features, severe (HCC)   . Agitation    . Manic behavior (HCC)   . Hyperthyroidism 09/21/2013  . Marijuana abuse 04/12/2013  . History of CHF (congestive heart failure) 02/01/2013  . Abscess of abdominal wall 10/18/2012  . HTN (hypertension) 04/08/2011  . Tachycardia 04/08/2011    Past Surgical History:  Procedure Laterality Date  . DILATION AND CURETTAGE OF UTERUS    . EYE SURGERY      OB History    Gravida Para Term Preterm AB Living   3 2 2   1 2    SAB TAB Ectopic Multiple Live Births   1 0     2       Home Medications    Prior to Admission medications   Medication Sig Start Date End Date Taking? Authorizing Provider  amantadine (SYMMETREL) 100 MG capsule Take 1 capsule (100 mg total) by mouth 2 (two) times daily. Patient not taking: Reported on 04/26/2017 04/21/16   Charm Rings, NP  hydrOXYzine (VISTARIL) 25 MG capsule Take 1 capsule (25 mg total) by mouth 3 (three) times daily as needed for anxiety. Patient not taking: Reported on 04/26/2017 03/29/16   Azalia Bilis, MD  mirtazapine (REMERON) 15 MG tablet Take 1 tablet (15 mg total) by mouth at bedtime. Patient not taking: Reported on 04/26/2017 04/21/16   Charm Rings, NP  temazepam (RESTORIL) 7.5 MG capsule Take 1 capsule (7.5 mg total) by mouth at bedtime as needed for sleep. Patient not taking: Reported on 04/26/2017 03/29/16   Azalia Bilis, MD  traZODone (DESYREL) 100 MG tablet Take 200 mg by mouth at bedtime.    [provider]  ziprasidone (GEODON) 60 MG capsule Take 1 capsule (60 mg total) by mouth 2 (two) times daily with a meal. Patient not taking: Reported on 04/26/2017 03/29/16   Azalia Bilisampos, Kevin, MD    Family History Family History  Problem Relation Age of Onset  . Hypertension Other   . Emphysema Other   . Asthma Son   . Diabetes Maternal Uncle   . Diabetes Paternal Grandmother     Social History Social History  Substance Use Topics  . Smoking status: Current Every Day Smoker    Types: Cigarettes    Last attempt to  quit: 08/21/2013  . Smokeless tobacco: Never Used  . Alcohol use 0.0 oz/week     Comment: Last drink: 1/2 beer PTA     Allergies   Amoxil [amoxicillin]; Ketorolac tromethamine; Prenatal [b-plex plus]; Tegretol [carbamazepine]; B-plex plus; and Tegretol [carbamazepine]   Review of Systems Review of Systems  Unable to perform ROS: Acuity of condition (manic, uncooperative)  Psychiatric/Behavioral: Positive for agitation.  All other systems reviewed and are negative.    Physical Exam Updated Vital Signs There were no vitals taken for this visit.  Physical Exam  Constitutional: She appears well-developed and well-nourished. No distress.  HENT:  Head: Normocephalic and atraumatic.  Eyes: Pupils are equal, round, and reactive to light. Conjunctivae and EOM are normal.  Neck: Normal range of motion. Neck supple.  Cardiovascular: Normal rate, regular rhythm and normal heart sounds.   No murmur heard. Pulmonary/Chest: Effort normal and breath sounds normal. No respiratory distress.  Abdominal: Soft. Bowel sounds are normal. She exhibits no distension. There is no tenderness.  Musculoskeletal: Normal range of motion. She exhibits no edema.  Neurological: She is alert. Coordination normal.  Skin: Skin is warm and dry.  Psychiatric:  Uncooperative Manic Agitated   Nursing note and vitals reviewed.    ED Treatments / Results  Labs (all labs ordered are listed, but only abnormal results are displayed) Labs Reviewed  BASIC METABOLIC PANEL  CBC WITH DIFFERENTIAL/PLATELET  ETHANOL  SALICYLATE LEVEL  ACETAMINOPHEN LEVEL  PREGNANCY, URINE  RAPID URINE DRUG SCREEN, HOSP PERFORMED  URINALYSIS, ROUTINE W REFLEX MICROSCOPIC  I-STAT BETA HCG BLOOD, ED ([facility], [facility], [facility] ONLY)    EKG  EKG Interpretation None       Radiology No results found.  Procedures Procedures (including critical care time)  Medications Ordered in ED Medications  ziprasidone (GEODON) injection 10 mg (not  administered)  sodium chloride 0.9 % injection (not administered)     Initial Impression / Assessment and Plan / ED Course  I have reviewed the triage vital signs and the nursing notes.  Pertinent labs & imaging results that were available during my care of the patient were reviewed by me and considered in my medical decision making (see chart for details).   0830 - Patient required chemical and physical restraints upon initial evaluation. IVC papers initiated. Medical clearance initiated.   1100 - Patient resting in bed. Restraints off. Pending final labs for medical clearance.    MSE: Screening Exam Complete. IVC obtained and will place on Pysch hold.  Will treat with Flagyl (2 grams PO x 1) since it appears that patient did [facility] Trich Vaginitis. No indication for repeat pelvic exam today (she was just worked up for vaginal discharge on 10/30.)  She is otherwise medically clear at this time for further psychiatric workup and treatment.   Final Clinical Impressions(s) / ED Diagnoses   Final diagnoses:  Mania [facility])    New Prescriptions New Prescriptions   No medications on file     Wynetta Fines, MD 04/28/17 1054    Wynetta Fines, MD 04/28/17 1114

## 2017-04-28 NOTE — ED Triage Notes (Signed)
[employer] called EMS after responding to a disturbance. Patient was striping naked and verbally abusive. Patient stated she had an STD and wished to be seen at the ER.

## 2017-04-28 NOTE — BH Assessment (Signed)
[facility] Assessment Progress Note  Per Nira Conn, NP pt meets criteria for inpt treatment. TTS to seek placement. Pt's nurse Rashell, RN has been notified. Attempted to notify  EDP Dr. Fredderick Phenix, MD but did not get an answer at [phone].   Princess Bruins, MSW, LCSW Therapeutic Triage Specialist  [phone]

## 2017-04-29 DIAGNOSIS — R451 Restlessness and agitation: Secondary | ICD-10-CM

## 2017-04-29 DIAGNOSIS — F1210 Cannabis abuse, uncomplicated: Secondary | ICD-10-CM | POA: Diagnosis not present

## 2017-04-29 DIAGNOSIS — F17210 Nicotine dependence, cigarettes, uncomplicated: Secondary | ICD-10-CM | POA: Diagnosis not present

## 2017-04-29 DIAGNOSIS — F29 Unspecified psychosis not due to a substance or known physiological condition: Secondary | ICD-10-CM

## 2017-04-29 DIAGNOSIS — R4586 Emotional lability: Secondary | ICD-10-CM

## 2017-04-29 DIAGNOSIS — R454 Irritability and anger: Secondary | ICD-10-CM

## 2017-04-29 DIAGNOSIS — R4587 Impulsiveness: Secondary | ICD-10-CM | POA: Diagnosis not present

## 2017-04-29 DIAGNOSIS — F3189 Other bipolar disorder: Secondary | ICD-10-CM

## 2017-04-29 MED ORDER — CHLORPROMAZINE HCL 25 MG/ML IJ SOLN
100.0000 mg | Freq: Once | INTRAMUSCULAR | Status: AC
  Administered 2017-04-29: 100 mg via INTRAMUSCULAR
  Filled 2017-04-29: qty 4

## 2017-04-29 MED ORDER — CHLORPROMAZINE HCL 25 MG/ML IJ SOLN: 50.0000 mg | Freq: Once | INTRAMUSCULAR | Status: DC

## 2017-04-29 MED ORDER — LORAZEPAM 2 MG/ML IJ SOLN: 2.0000 mg | Freq: Once | INTRAMUSCULAR | Status: DC

## 2017-04-29 MED ORDER — DIPHENHYDRAMINE HCL 50 MG/ML IJ SOLN
50.0000 mg | Freq: Once | INTRAMUSCULAR | Status: AC
  Administered 2017-04-29: 50 mg via INTRAMUSCULAR
  Filled 2017-04-29: qty 1

## 2017-04-29 MED ORDER — NICOTINE 7 MG/24HR TD PT24
7.0000 mg | MEDICATED_PATCH | Freq: Every day | TRANSDERMAL | Status: DC
  Administered 2017-04-29: 7 mg via TRANSDERMAL
  Filled 2017-04-29: qty 1

## 2017-04-29 NOTE — ED Notes (Signed)
Patient meets criteria for release out of seclusion room. Patient went to restroom. After using restroom patient went room and got in her bed. Patient resting quietly at this time. Encouragement and support provided and water given. Q 15 min safety checks remain place and video monitoring.

## 2017-04-29 NOTE — ED Notes (Signed)
This nurse in pt room. Writing noted with crayon all over patients walls. Pt then became irate, yelling, screaming, cussing, verbally aggressive, verbally threatening. Pt up at nurses station, without shirt. Refusing to put shirt on. Behavior not redirectable. Jameson,NP made aware of pt behavior,

## 2017-04-29 NOTE — Consult Note (Addendum)
[facility] Consult   Reason for Consult:  Psychosis/agitation  Referring Physician:  EDP Patient Identification: Christine Cobb MRN:  [id number] Principal Diagnosis: Severe bipolar affective disorder with psychosis [facility]) Diagnosis:   Patient Active Problem List   Diagnosis Date Noted  . Cocaine abuse with cocaine-induced mood disorder ([place]) [F14.14] 06/02/2016  . Schizoaffective disorder, bipolar type ([place]) [F25.0]   . Cocaine abuse ([place]) [F14.10] 12/31/2014  . Bipolar disorder, current episode manic without psychotic features, severe ([place]) [F31.13]   . Agitation [R45.1]   . Manic behavior ([place]) [F30.10]   . Hyperthyroidism [E05.90] 09/21/2013  . Marijuana abuse [F12.10] 04/12/2013  . History of CHF (congestive heart failure) [Z86.79] 02/01/2013  . Abscess of abdominal wall [L02.211] 10/18/2012  . HTN (hypertension) [I10] 04/08/2011  . Tachycardia [R00.0] 04/08/2011    Total Time spent with patient: 20 minutes  Subjective:   Christine Cobb is a 41 y.o. female patient admitted with agitation and psychosis under IVP placed by EDP.  HPI:   Ms. Leonides Schanz has been agitated and disorganized since admission. She has been tangential and disorganized in thought process.  She was yelling and threatening staff this morning. She removed her scrub top this morning and was walking around the unit. She has required several emergency medications for agitation including Geodon 10 mg IM (x 4) yesterday and Thorazine 100 mg with Bendaryl 50 mg IM this morning. She is also receiving her prescribed medications. She was unable to participate in interview this morning due to agitation and required the seclusion room.   Past Psychiatric History: Bipolar disorder, polysubstance abuse  Risk to Self: Suicidal Ideation: No (pt denies) Suicidal Intent: No Is patient at risk for suicide?: No Suicidal Plan?: No Access to Means: No What has been your use of drugs/alcohol within  the last 12 months?: UTA Triggers for Past Attempts: None known Intentional Self Injurious Behavior: None Risk to Others: Yes secondary to agitation.  Prior Inpatient Therapy: Prior Inpatient Therapy: Yes Prior Therapy Dates: 2014 Prior Therapy Facilty/Provider(s): [facility]  Reason for Treatment: Psychosis; Bipolar D/O Prior Outpatient Therapy: Prior Outpatient Therapy:  (unknown) Does patient have an ACCT team?: Unknown Does patient have Intensive In-House Services?  : Unknown Does patient have [facility] services? : Unknown Does patient have P4CC services?: Unknown  Past Medical History:  Past Medical History:  Diagnosis Date  . Arm pain   . Bipolar affective disorder, currently manic, mild ([place])   . CHF (congestive heart failure) ([place])   . Eye globe prosthesis   . HTN (hypertension)   . Hyperthyroidism   . Sinus tachycardia     Past Surgical History:  Procedure Laterality Date  . DILATION AND CURETTAGE OF UTERUS    . EYE SURGERY     Family History:  Family History  Problem Relation Age of Onset  . Hypertension Other   . Emphysema Other   . Asthma Son   . Diabetes Maternal Uncle   . Diabetes Paternal Grandmother    Family Psychiatric  History: Unknown  Social History:  History  Alcohol Use  . 0.0 oz/week    Comment: Last drink: 1/2 beer PTA     History  Drug Use  . Types: Marijuana    Comment: Pt sts "anything and everything"    Social History   Social History  . Marital status: Married    Spouse name: N/A  . Number of children: N/A  . Years of education: N/A   Social History Main Topics  .  Smoking status: Current Every Day Smoker    Types: Cigarettes    Last attempt to quit: 08/21/2013  . Smokeless tobacco: Never Used  . Alcohol use 0.0 oz/week     Comment: Last drink: 1/2 beer PTA  . Drug use: Yes    Types: Marijuana     Comment: Pt sts "anything and everything"  . Sexual activity: Yes    Birth control/ protection: Injection     Comment: crack  cocaine   Other Topics Concern  . None   Social History Narrative  . None   Additional Social History: N/A    Allergies:   Allergies  Allergen Reactions  . Amoxil [Amoxicillin] Anaphylaxis  . Ketorolac Tromethamine Hives and Swelling  . Prenatal [B-Plex Plus] Nausea Only  . Tegretol [Carbamazepine] Other (See Comments)    Swelling, skin peeling, skin discoloration  . B-Plex Plus Nausea Only    Prenatal   . Tegretol [Carbamazepine] Swelling and Other (See Comments)    Skin peeling Skin discoloring     Labs:  Results for orders placed or performed during the hospital encounter of 04/28/17 (from the past 48 hour(s))  Basic metabolic panel     Status: Abnormal   Collection Time: 04/28/17  9:44 AM  Result Value Ref Range   Sodium 142 135 - 145 mmol/L   Potassium 3.7 3.5 - 5.1 mmol/L   Chloride 108 101 - 111 mmol/L   CO2 23 22 - 32 mmol/L   Glucose, Bld 94 65 - 99 mg/dL   BUN 15 6 - 20 mg/dL   Creatinine, Ser 1.17 (H) 0.44 - 1.00 mg/dL   Calcium 9.7 8.9 - 10.3 mg/dL   GFR calc non Af Amer 58 (L) >60 mL/min   GFR calc Af Amer >60 >60 mL/min    Comment: (NOTE) The eGFR has been calculated using the CKD EPI equation. This calculation has not been validated in all clinical situations. eGFR's persistently <60 mL/min signify possible Chronic Kidney Disease.    Anion gap 11 5 - 15  CBC with Differential     Status: None   Collection Time: 04/28/17  9:44 AM  Result Value Ref Range   WBC 7.5 4.0 - 10.5 K/uL   RBC 4.57 3.87 - 5.11 MIL/uL   Hemoglobin 13.0 12.0 - 15.0 g/dL   HCT 38.4 36.0 - 46.0 %   MCV 84.0 78.0 - 100.0 fL   MCH 28.4 26.0 - 34.0 pg   MCHC 33.9 30.0 - 36.0 g/dL   RDW 13.8 11.5 - 15.5 %   Platelets 290 150 - 400 K/uL   Neutrophils Relative % 67 %   Neutro Abs 5.0 1.7 - 7.7 K/uL   Lymphocytes Relative 24 %   Lymphs Abs 1.8 0.7 - 4.0 K/uL   Monocytes Relative 7 %   Monocytes Absolute 0.6 0.1 - 1.0 K/uL   Eosinophils Relative 1 %   Eosinophils Absolute  0.1 0.0 - 0.7 K/uL   Basophils Relative 1 %   Basophils Absolute 0.1 0.0 - 0.1 K/uL  Ethanol     Status: None   Collection Time: 04/28/17  9:44 AM  Result Value Ref Range   Alcohol, Ethyl (B) <10 <10 mg/dL    Comment:        LOWEST DETECTABLE LIMIT FOR SERUM ALCOHOL IS 10 mg/dL FOR MEDICAL PURPOSES ONLY   Salicylate level     Status: None   Collection Time: 04/28/17  9:44 AM  Result Value Ref Range  Salicylate Lvl <8.1 2.8 - 30.0 mg/dL  Acetaminophen level     Status: Abnormal   Collection Time: 04/28/17  9:44 AM  Result Value Ref Range   Acetaminophen (Tylenol), Serum <10 (L) 10 - 30 ug/mL    Comment:        THERAPEUTIC CONCENTRATIONS VARY SIGNIFICANTLY. A RANGE OF 10-30 ug/mL MAY BE AN EFFECTIVE CONCENTRATION FOR MANY PATIENTS. HOWEVER, SOME ARE BEST TREATED AT CONCENTRATIONS OUTSIDE THIS RANGE. ACETAMINOPHEN CONCENTRATIONS >150 ug/mL AT 4 HOURS AFTER INGESTION AND >50 ug/mL AT 12 HOURS AFTER INGESTION ARE OFTEN ASSOCIATED WITH TOXIC REACTIONS.   I-Stat beta hCG blood, ED     Status: None   Collection Time: 04/28/17  9:55 AM  Result Value Ref Range   I-stat hCG, quantitative <5.0 <5 mIU/mL   Comment 3            Comment:   GEST. AGE      CONC.  (mIU/mL)   <=1 WEEK        5 - 50     2 WEEKS       50 - 500     3 WEEKS       100 - 10,000     4 WEEKS     1,000 - 30,000        FEMALE AND NON-PREGNANT FEMALE:     LESS THAN 5 mIU/mL   Pregnancy, urine     Status: None   Collection Time: 04/28/17 10:15 AM  Result Value Ref Range   Preg Test, Ur NEGATIVE NEGATIVE    Comment:        THE SENSITIVITY OF THIS METHODOLOGY IS >20 mIU/mL.   Urine rapid drug screen (hosp performed)     Status: None   Collection Time: 04/28/17 10:15 AM  Result Value Ref Range   Opiates NONE DETECTED NONE DETECTED   Cocaine NONE DETECTED NONE DETECTED   Benzodiazepines NONE DETECTED NONE DETECTED   Amphetamines NONE DETECTED NONE DETECTED   Tetrahydrocannabinol NONE DETECTED NONE  DETECTED   Barbiturates NONE DETECTED NONE DETECTED    Comment:        DRUG SCREEN FOR MEDICAL PURPOSES ONLY.  IF CONFIRMATION IS NEEDED FOR ANY PURPOSE, NOTIFY LAB WITHIN 5 DAYS.        LOWEST DETECTABLE LIMITS FOR URINE DRUG SCREEN Drug Class       Cutoff (ng/mL) Amphetamine      1000 Barbiturate      200 Benzodiazepine   017 Tricyclics       510 Opiates          300 Cocaine          300 THC              50   Urinalysis, Routine w reflex microscopic     Status: Abnormal   Collection Time: 04/28/17 10:15 AM  Result Value Ref Range   Color, Urine YELLOW YELLOW   APPearance CLEAR CLEAR   Specific Gravity, Urine 1.008 1.005 - 1.030   pH 6.0 5.0 - 8.0   Glucose, UA NEGATIVE NEGATIVE mg/dL   Hgb urine dipstick LARGE (A) NEGATIVE   Bilirubin Urine NEGATIVE NEGATIVE   Ketones, ur NEGATIVE NEGATIVE mg/dL   Protein, ur NEGATIVE NEGATIVE mg/dL   Nitrite NEGATIVE NEGATIVE   Leukocytes, UA MODERATE (A) NEGATIVE   RBC / HPF TOO NUMEROUS TO COUNT 0 - 5 RBC/hpf   WBC, UA 6-30 0 - 5 WBC/hpf   Bacteria, UA RARE (A)  NONE SEEN   Squamous Epithelial / LPF 0-5 (A) NONE SEEN   Mucus PRESENT     Current Facility-Administered Medications  Medication Dose Route Frequency Provider Last Rate Last Dose  . hydrOXYzine (VISTARIL) capsule 25 mg  25 mg Oral TID PRN Jola Schmidt, MD      . ziprasidone (GEODON) capsule 60 mg  60 mg Oral BID WC Jola Schmidt, MD   60 mg at 04/29/17 [id number]   Current Outpatient Prescriptions  Medication Sig Dispense Refill  . traZODone (DESYREL) 100 MG tablet Take 200 mg by mouth at bedtime.    Marland Kitchen amantadine (SYMMETREL) 100 MG capsule Take 1 capsule (100 mg total) by mouth 2 (two) times daily. (Patient not taking: Reported on 04/26/2017) 60 capsule 0  . hydrOXYzine (VISTARIL) 25 MG capsule Take 1 capsule (25 mg total) by mouth 3 (three) times daily as needed for anxiety. (Patient not taking: Reported on 04/28/2017) 30 capsule 0  . mirtazapine (REMERON) 15 MG tablet Take 1  tablet (15 mg total) by mouth at bedtime. (Patient not taking: Reported on 04/26/2017) 30 tablet 0  . temazepam (RESTORIL) 7.5 MG capsule Take 1 capsule (7.5 mg total) by mouth at bedtime as needed for sleep. (Patient not taking: Reported on 04/26/2017) 30 capsule 0  . ziprasidone (GEODON) 60 MG capsule Take 1 capsule (60 mg total) by mouth 2 (two) times daily with a meal. (Patient not taking: Reported on 04/26/2017) 60 capsule 0    Musculoskeletal: Strength & Muscle Tone: within normal limits Gait & Station: normal Patient leans: N/A  Psychiatric Specialty Exam: Physical Exam  Nursing note and vitals reviewed. Constitutional: She appears well-developed and well-nourished.  HENT:  Head: Normocephalic and atraumatic.  Neck: Normal range of motion.  Respiratory: Effort normal.  Musculoskeletal: Normal range of motion.  Neurological: She is alert.  Skin: No rash noted.  Psychiatric: Her affect is angry and labile. Her speech is rapid and/or pressured. She is agitated and aggressive. She expresses impulsivity and inappropriate judgment.    ROS Unable to assess due to agitation.   Blood pressure 118/90, pulse 100, temperature 98 F (36.7 C), temperature source Oral, resp. rate 18, SpO2 100 %.There is no height or weight on file to calculate BMI.  General Appearance: Disheveled  Eye Contact:  Fair  Speech:  Pressured  Volume:  Increased  Mood:  Irritable  Affect:  Labile  Thought Process:  Disorganized  Orientation:  Unable to assess due to agitation.   Thought Content:  Illogical  Suicidal Thoughts: Unable to assess due to agitation.   Homicidal Thoughts:  Unable to assess due to agitation.   Memory:  Unable to assess due to agitation.   Judgement:  Poor  Insight:  Lacking  Psychomotor Activity:  Increased secondary to agitation.   Concentration:  Concentration: Poor and Attention Span: Poor  Recall:  Unable to assess due to agitation.   Fund of Knowledge:  Poor  Language:   Fair  Akathisia:  No  Handed:  Right  AIMS (if indicated):   N/A  Assets:  Housing  ADL's:  Intact  Cognition:  WNL  Sleep:   Poor    Assessment:  Ms. Kailoni Vahle is admitted for psychosis and agitation under IVC placed by EDP. She continues to exhibit agitation and disorganized behaviors that warrant inpatient psychiatric hospitalization for stabilization and treatment.   Treatment Plan Summary: Daily contact with patient to assess and evaluate symptoms and progress in treatment and Medication management  -Continue behavioral  medications for agitation causing harm to self or others.  -Continue home medication: Geodon 60 mg BID for bipolar disorder/psychosis.  -Continue to monitor QTc. QTc 460 today.   Disposition: Recommend psychiatric Inpatient admission when medically cleared.  Faythe Dingwall, DO 04/29/2017 10:52 AM

## 2017-04-29 NOTE — ED Notes (Signed)
Pt refusing to stay still long enough to get accurate bp.

## 2017-04-29 NOTE — ED Notes (Signed)
Patient refuses vital signs at this time

## 2017-04-29 NOTE — BH Assessment (Signed)
[facility] Assessment Progress Note  Per Juanetta Beets, DO, this pt requires psychiatric hospitalization.  Malva Limes, RN, [facility] has assigned pt to [facility] Rm [id number]; [facility] will call when they are ready to receive pt.  Pt presents under IVC initiated by EDP Kristine Royal, MD, and IVC documents have been faxed to [facility].  Pt's nurse, Morrie Sheldon, has been notified, and agrees to call report to [phone].  Pt is to be transported via [employer] when the time comes.   Doylene Canning, MA Triage Specialist [phone]

## 2017-04-29 NOTE — ED Notes (Signed)
[facility] AC Randa Evens called and stated that the pt will not be able to come until much closer to morning.

## 2017-04-29 NOTE — ED Notes (Signed)
Bed: [id number] Expected date:  Expected time:  Means of arrival:  Comments: Seclusion room 

## 2017-04-29 NOTE — ED Notes (Signed)
SBAR Report received from previous nurse. Pt received calm and visible on unit. Pt asleep and would not answer questions related to  current SI/ HI, A/V H, depression, anxiety, or pain at this time, and appears otherwise stable and free of distress. Pt reminded of camera surveillance, q 15 min rounds, and rules of the milieu. Will continue to assess.

## 2017-04-30 ENCOUNTER — Encounter (HOSPITAL_COMMUNITY): Payer: Self-pay | Admitting: *Deleted

## 2017-04-30 ENCOUNTER — Inpatient Hospital Stay (HOSPITAL_COMMUNITY)
Admission: AD | Admit: 2017-04-30 | Discharge: 2017-05-09 | DRG: 885 | Disposition: A | Discharge status: Home / Self Care | Payer: Medicare Other | Attending: Psychiatry | Admitting: Psychiatry

## 2017-04-30 DIAGNOSIS — F312 Bipolar disorder, current episode manic severe with psychotic features: Secondary | ICD-10-CM | POA: Diagnosis present

## 2017-04-30 DIAGNOSIS — G4700 Insomnia, unspecified: Secondary | ICD-10-CM | POA: Diagnosis not present

## 2017-04-30 DIAGNOSIS — F39 Unspecified mood [affective] disorder: Secondary | ICD-10-CM | POA: Diagnosis not present

## 2017-04-30 DIAGNOSIS — E119 Type 2 diabetes mellitus without complications: Secondary | ICD-10-CM | POA: Diagnosis present

## 2017-04-30 DIAGNOSIS — F3113 Bipolar disorder, current episode manic without psychotic features, severe: Secondary | ICD-10-CM | POA: Diagnosis present

## 2017-04-30 DIAGNOSIS — F909 Attention-deficit hyperactivity disorder, unspecified type: Secondary | ICD-10-CM | POA: Diagnosis present

## 2017-04-30 DIAGNOSIS — Z970 Presence of artificial eye: Secondary | ICD-10-CM

## 2017-04-30 DIAGNOSIS — F17210 Nicotine dependence, cigarettes, uncomplicated: Secondary | ICD-10-CM | POA: Diagnosis not present

## 2017-04-30 DIAGNOSIS — F419 Anxiety disorder, unspecified: Secondary | ICD-10-CM | POA: Diagnosis present

## 2017-04-30 DIAGNOSIS — F1910 Other psychoactive substance abuse, uncomplicated: Secondary | ICD-10-CM | POA: Diagnosis not present

## 2017-04-30 DIAGNOSIS — Z888 Allergy status to other drugs, medicaments and biological substances status: Secondary | ICD-10-CM | POA: Diagnosis not present

## 2017-04-30 DIAGNOSIS — Z880 Allergy status to penicillin: Secondary | ICD-10-CM

## 2017-04-30 DIAGNOSIS — I509 Heart failure, unspecified: Secondary | ICD-10-CM | POA: Diagnosis present

## 2017-04-30 DIAGNOSIS — F250 Schizoaffective disorder, bipolar type: Secondary | ICD-10-CM | POA: Diagnosis present

## 2017-04-30 DIAGNOSIS — I110 Hypertensive heart disease with heart failure: Secondary | ICD-10-CM | POA: Diagnosis present

## 2017-04-30 DIAGNOSIS — R443 Hallucinations, unspecified: Secondary | ICD-10-CM | POA: Diagnosis not present

## 2017-04-30 DIAGNOSIS — F1210 Cannabis abuse, uncomplicated: Secondary | ICD-10-CM | POA: Diagnosis not present

## 2017-04-30 DIAGNOSIS — E0590 Thyrotoxicosis, unspecified without thyrotoxic crisis or storm: Secondary | ICD-10-CM | POA: Diagnosis present

## 2017-04-30 DIAGNOSIS — R451 Restlessness and agitation: Secondary | ICD-10-CM | POA: Diagnosis not present

## 2017-04-30 MED ORDER — ENSURE ENLIVE PO LIQD
237.0000 mL | Freq: Two times a day (BID) | ORAL | Status: DC
  Administered 2017-04-30 – 2017-05-09 (×18): 237 mL via ORAL

## 2017-04-30 MED ORDER — HYDROXYZINE HCL 25 MG PO TABS
25.0000 mg | ORAL_TABLET | Freq: Three times a day (TID) | ORAL | Status: DC | PRN
  Administered 2017-04-30 – 2017-05-03 (×8): 25 mg via ORAL
  Filled 2017-04-30 (×8): qty 1

## 2017-04-30 MED ORDER — LORAZEPAM 2 MG/ML IJ SOLN
INTRAMUSCULAR | Status: AC
  Filled 2017-04-30: qty 1

## 2017-04-30 MED ORDER — HYDROXYZINE PAMOATE 25 MG PO CAPS
25.0000 mg | ORAL_CAPSULE | Freq: Three times a day (TID) | ORAL | Status: DC | PRN
  Filled 2017-04-30 (×4): qty 1

## 2017-04-30 MED ORDER — BENZTROPINE MESYLATE 1 MG PO TABS
ORAL_TABLET | ORAL | Status: AC
  Administered 2017-04-30: 1 mg
  Filled 2017-04-30: qty 1

## 2017-04-30 MED ORDER — ACETAMINOPHEN 325 MG PO TABS
650.0000 mg | ORAL_TABLET | Freq: Four times a day (QID) | ORAL | Status: DC | PRN
  Administered 2017-04-30 – 2017-05-04 (×10): 650 mg via ORAL
  Filled 2017-04-30 (×10): qty 2

## 2017-04-30 MED ORDER — TRIAMTERENE-HCTZ 37.5-25 MG PO TABS
1.0000 | ORAL_TABLET | Freq: Every day | ORAL | Status: DC
  Administered 2017-04-30 – 2017-05-09 (×10): 1 via ORAL
  Filled 2017-04-30 (×5): qty 1
  Filled 2017-04-30: qty 7
  Filled 2017-04-30 (×5): qty 1

## 2017-04-30 MED ORDER — AMANTADINE HCL 100 MG PO CAPS
100.0000 mg | ORAL_CAPSULE | Freq: Two times a day (BID) | ORAL | Status: DC
  Administered 2017-04-30 – 2017-05-09 (×18): 100 mg via ORAL
  Filled 2017-04-30: qty 14
  Filled 2017-04-30 (×3): qty 1
  Filled 2017-04-30: qty 14
  Filled 2017-04-30 (×18): qty 1

## 2017-04-30 MED ORDER — DIPHENHYDRAMINE HCL 50 MG/ML IJ SOLN
50.0000 mg | Freq: Once | INTRAMUSCULAR | Status: AC
Start: 2017-04-30 — End: 2017-04-30

## 2017-04-30 MED ORDER — BENZTROPINE MESYLATE 1 MG PO TABS
1.0000 mg | ORAL_TABLET | Freq: Every day | ORAL | Status: DC
  Filled 2017-04-30: qty 1

## 2017-04-30 MED ORDER — NICOTINE POLACRILEX 2 MG MT GUM
2.0000 mg | CHEWING_GUM | OROMUCOSAL | Status: DC | PRN
  Administered 2017-04-30 – 2017-05-09 (×17): 2 mg via ORAL
  Filled 2017-04-30 (×10): qty 1

## 2017-04-30 MED ORDER — MIRTAZAPINE 15 MG PO TABS
15.0000 mg | ORAL_TABLET | Freq: Every day | ORAL | Status: DC
  Administered 2017-04-30 – 2017-05-08 (×6): 15 mg via ORAL
  Filled 2017-04-30 (×4): qty 1
  Filled 2017-04-30: qty 7
  Filled 2017-04-30 (×8): qty 1

## 2017-04-30 MED ORDER — TRIAMCINOLONE 0.1 % CREAM:EUCERIN CREAM 1:1
TOPICAL_CREAM | Freq: Three times a day (TID) | CUTANEOUS | Status: DC | PRN
  Administered 2017-05-01: 1 via TOPICAL
  Administered 2017-05-01: 11:00:00 via TOPICAL
  Filled 2017-04-30 (×2): qty 1

## 2017-04-30 MED ORDER — ALUM & MAG HYDROXIDE-SIMETH 200-200-20 MG/5ML PO SUSP
30.0000 mL | ORAL | Status: DC | PRN
  Administered 2017-05-01 – 2017-05-04 (×2): 30 mL via ORAL
  Filled 2017-04-30 (×2): qty 30

## 2017-04-30 MED ORDER — TRIAMTERENE-HCTZ 37.5-25 MG PO TABS
ORAL_TABLET | ORAL | Status: AC
  Filled 2017-04-30: qty 1

## 2017-04-30 MED ORDER — DIPHENHYDRAMINE HCL 50 MG/ML IJ SOLN
INTRAMUSCULAR | Status: AC
  Administered 2017-04-30: 11:00:00
  Filled 2017-04-30: qty 1

## 2017-04-30 MED ORDER — MAGNESIUM HYDROXIDE 400 MG/5ML PO SUSP: 30.0000 mL | Freq: Every day | ORAL | Status: DC | PRN

## 2017-04-30 MED ORDER — ZIPRASIDONE HCL 60 MG PO CAPS
60.0000 mg | ORAL_CAPSULE | Freq: Two times a day (BID) | ORAL | Status: DC
  Filled 2017-04-30 (×2): qty 1

## 2017-04-30 MED ORDER — NICOTINE POLACRILEX 2 MG MT GUM
2.0000 mg | CHEWING_GUM | OROMUCOSAL | Status: DC
  Administered 2017-04-30: 2 mg via ORAL
  Filled 2017-04-30: qty 1

## 2017-04-30 NOTE — ED Notes (Signed)
Up to the bathroom 

## 2017-04-30 NOTE — Progress Notes (Signed)
Patient ID: Christine Cobb, female   DOB: [birth year], 41 y.o.   MRN: [id number]    41 year old black female admitted to [facility] after she was picked up by [employer] for stripping naked and being verbally aggressive. At time of admission patient refused all parts of the admission, she reported that she did not need any help and that she knew her body and knew what she needed. Pt reported that staff just needed to give her her medication and let her be. Pt remained very agitated and irritable, she had no insight. Staff had a hard time trying to get patient back on the unit, eventually she came back to the unit and continued to refuse treatment. Pt then started having some EPS, thick tongue and drooling at the mouth. Pts vital signs was also abnormal, but pt refused to let staff recheck she also refused EKG. Tanika NP was made aware, new orders noted. Pt was given Ativan and Benadryl, both helped with EPS. Pt reported that she was not depressed, not having any SI, not having any HI. Pt reported that she just needed to go home.

## 2017-04-30 NOTE — ED Notes (Addendum)
Pt refuses to allow apical pulse to be done DNP aware, recheck with radial.  Pt refused to allow radial pulse to be checked. DNP aware, OK to transport to [facility]

## 2017-04-30 NOTE — ED Notes (Signed)
Pt is physically active and using the dinamap HR is 150. This happened yesterday and manual re-check of HR was 100. This morning pt would not allow this writer to check her HR manually because she is irritable with this Clinical research associate. Pt is not symptomatic. Spoke with Berneice Heinrich who agreed that because of the above to go ahead and send pt to [facility].

## 2017-04-30 NOTE — ED Notes (Signed)
[employer] here to transport 

## 2017-04-30 NOTE — ED Notes (Signed)
Up in the hall. Pt is aware that she will transfer to [facility] shortly

## 2017-04-30 NOTE — [facility] Suicide Risk Assessment (Signed)
[facility]   Nursing information obtained from:   patient and chart  Demographic factors:   41 year old single female, has two children, who are currently with their father Current Mental Status:   see below  Loss Factors:   chronic mental illness  Historical Factors:   Schizoaffective Disorder, History of Cocaine Abuse  Risk Reduction Factors:   Resilience, sense of responsibility to family   Total Time spent with patient: 45 minutes Principal Problem: Schizoaffective Disorder Diagnosis:   Patient Active Problem List   Diagnosis Date Noted  . Bipolar affective disorder, manic, severe (HCC) [F31.13] 04/30/2017  . Severe bipolar affective disorder with psychosis ([facility]CC) [F31.89] 04/29/2017  . Schizoaffective disorder, bipolar type ([facility]) [F25.0]   . Cocaine abuse ([facility]) [F14.10] 12/31/2014  . Bipolar disorder, current episode manic without psychotic features, severe ([facility]) [F31.13]   . Agitation [R45.1]   . Manic behavior ([facility]) [F30.10]   . Hyperthyroidism [E05.90] 09/21/2013  . Marijuana abuse [F12.10] 04/12/2013  . History of CHF (congestive heart failure) [Z86.79] 02/01/2013  . Abscess of abdominal wall [L02.211] 10/18/2012  . HTN (hypertension) [I10] 04/08/2011  . Tachycardia [R00.0] 04/08/2011    Continued Clinical Symptoms:  Alcohol Use Disorder Identification Test Final Score (AUDIT): 0 The "Alcohol Use Disorders Identification Test", Guidelines for Use in Primary Care, Second Edition.  World Science writer[employer] [facility](WHO). Score between 0-7:  no or low risk or alcohol related problems. Score between 8-15:  moderate risk of alcohol related problems. Score between 16-19:  high risk of alcohol related problems. Score 20 or above:  warrants further diagnostic evaluation for alcohol dependence and treatment.   CLINICAL FACTORS:  Patient is a 41 year old female, admitted under commitment. As per chart notes, presented with disorganized, agitated behaviors in  ED, such as disrobing  singing at top of her voice , threatening, and exhibiting  disorganized speech and thought process .  Patient has history of Schiozaffective Disorder and prior psychiatric admissions . She also has history of cocaine abuse, but admission UDS was negative, admission BAL negative.  Patient denies alcohol abuse, states she drinks on occasion. Required medications for agitation in ED. As per chart patient had been prescribed Remeron, Geodon, Restoril, but was not taking medications prior to admission. Today patient presents calmer, not loud or threatening , but still disorganized, labile. States " I don't need to be here, I need to be in the field with my son", " they told me I came for STD".  Medical History is remarkable for HTN and DM ( of note, chart notes do not indicate DM and glucose is WNL)   Dx- Schizoaffective Disorder   Plan - Inpatient admission. Patient received Geodon in ED, and has been on this medication in the past  11/2 EKG QTc 460. She states she has been on Zyprexa in the past, would consider starting this medication at present for antipsychotic and sedative effects. Patient states she has taken Symmetrel in the past for EPS and denies side effects. Start Zyprexa 5 mgrs BID Start Ativan 1 mgrs  Q 6 hours PRN for anxiety or agitation Patient reports she has been on HCTZ for HTN management . Obtain routine labs- repeat EKG    Musculoskeletal: Strength & Muscle Tone: within normal limits no cog wheeling or stiffness noted  Gait & Station: normal Patient leans: N/A  Psychiatric Specialty Exam: Physical Exam  ROS denies chest pain, no shortness of breath , no vomiting   Blood pressure Marland Kitchen(!)  160/123, pulse (!) 157, temperature 99 F (37.2 C), temperature source Oral, resp. rate 18, height 5\' 4"  (1.626 m), weight 71.2 kg (157 lb), SpO2 100 %.Body mass index is 26.95 kg/m.  General Appearance: Fairly Groomed  Eye Contact:  Fair  Speech:  Normal Rate   Volume:  variable   Mood:  Dysphoric  Affect:  vaguely anxious and irritable   Thought Process:  Disorganized and Descriptions of Associations: Tangential  Orientation:  Full (Time, Place, and Person)  Thought Content:  denies hallucinations at this time, presents guarded and irritable, but no overt delusions are expressed at this time  Suicidal Thoughts:  No denies suicidal ideations, denies homicidal ideations   Homicidal Thoughts:  No  Memory:  recent and remote fair   Judgement:  Impaired  Insight:  Lacking  Psychomotor Activity:  vaguely restless   Concentration:  Concentration: Fair and Attention Span: Fair  Recall:  [employer] of Knowledge:  Fair  Language:  Fair  Akathisia:  Negative  Handed:  Right  AIMS (if indicated):     Assets:  Desire for Improvement Resilience  ADL's:  fair  Cognition:  WNL  Sleep:         COGNITIVE FEATURES THAT CONTRIBUTE TO RISK:  Closed-mindedness and Loss of executive function    SUICIDE RISK:   Moderate:  Frequent suicidal ideation with limited intensity, and duration, some specificity in terms of plans, no associated intent, good self-control, limited dysphoria/symptomatology, some risk factors present, and identifiable protective factors, including available and accessible social support.  PLAN OF CARE: Patient will be admitted to inpatient psychiatric unit for stabilization and safety. Will provide and encourage milieu participation. Provide medication management and maked adjustments as needed.  Will follow daily.    I certify that inpatient services furnished can reasonably be expected to improve the patient's condition.   Craige Cotta, MD 04/30/2017, 12:03 PM

## 2017-04-30 NOTE — Progress Notes (Signed)
Patient ID: Christine Cobb, female   DOB: 01-16-1976, 41 y.o.   MRN: [id number]     Rechecked patients vital signs her BP was 108/73 and her HR was 62, Dr. Jama Flavors was notified.

## 2017-04-30 NOTE — ED Notes (Addendum)
Pt ambulatory w/o difficulty to [facility] with [employer], belongings given to [employer] 

## 2017-04-30 NOTE — ED Notes (Addendum)
Given with milk, pt ate sandwich at approx 0730.  Pt denies pain/discomfort, si/hi./avh at this time.  Pt pleasant and cooperative.

## 2017-04-30 NOTE — Progress Notes (Signed)
Patient ID: Christine Cobb, female   DOB: Sep 20, 1975, 41 y.o.   MRN: [id number]    Pt refused to have EKG done, she reported that she did not need. Jacquelyne Balint RN

## 2017-04-30 NOTE — Progress Notes (Signed)
Did not attend group 

## 2017-04-30 NOTE — ED Notes (Signed)
Pt refuses to allow pulse to be rechecked

## 2017-04-30 NOTE — ED Notes (Signed)
Shelita RN updated concerning pt's pulse rate

## 2017-04-30 NOTE — Progress Notes (Signed)
Patient ID: Christine Cobb, female   DOB: 1976/04/30, 41 y.o.   MRN: [id number] Per State regulations 482.30 this chart was reviewed for medical necessity with respect to the patient's admission/duration of stay.    Next review date: 05/04/17  Thurman Coyer, BSN, RN-BC  Case Manager

## 2017-04-30 NOTE — H&P (Signed)
Psychiatric Admission Assessment Adult  Patient Identification: Christine Cobb MRN:  [id number] Date of Evaluation:  04/30/2017 Chief Complaint:  BIPOLAR DISORDER MOST RECENT EPISODE MANIC Principal Diagnosis: Bipolar affective disorder, manic, severe ([place]) Diagnosis:   Patient Active Problem List   Diagnosis Date Noted  . Bipolar affective disorder, manic, severe ([place]) [F31.13] 04/30/2017  . Severe bipolar affective disorder with psychosis ([place]) [F31.89] 04/29/2017  . Schizoaffective disorder, bipolar type ([place]) [F25.0]   . Cocaine abuse ([place]) [F14.10] 12/31/2014  . Bipolar disorder, current episode manic without psychotic features, severe ([place]) [F31.13]   . Agitation [R45.1]   . Manic behavior ([place]) [F30.10]   . Hyperthyroidism [E05.90] 09/21/2013  . Marijuana abuse [F12.10] 04/12/2013  . History of CHF (congestive heart failure) [Z86.79] 02/01/2013  . Abscess of abdominal wall [L02.211] 10/18/2012  . HTN (hypertension) [I10] 04/08/2011  . Tachycardia [R00.0] 04/08/2011   History of Present Illness: Per Consult note- [name] has been agitated and disorganized since admission. She has been tangential and disorganized in thought process.  She was yelling and threatening staff this morning. She removed her scrub top this morning and was walking around the unit. She has required several emergency medications for agitation including Geodon 10 mg IM (x 4) yesterday and Thorazine 100 mg with Bendaryl 50 mg IM this morning. She is also receiving her prescribed medications. She was unable to participate in interview this morning due to agitation and required the seclusion room.   On Evaluation: Christine Cobb is awake, alert and oriented *3. Patient was evuluated by NP and MD. Patient continues to report she is out the hospital for STD screening/ check. Patient continues to present with irritability, disorganization and agitation. Noted that patients vitals was unstable at  admission. Staff reports patient wasn't agreeable to rechecking vitals. Patient reports she has history or diabetes and HTN. MD to evaluate medication for stabilization. Support, encouragement and reassurance was provided.   Associated Signs/Symptoms: Depression Symptoms:  psychomotor agitation, fatigue, feelings of worthlessness/guilt, (Hypo) Manic Symptoms:  Distractibility, Impulsivity, Irritable Mood, Anxiety Symptoms:  Excessive Worry, Social Anxiety, Psychotic Symptoms:  Hallucinations: None PTSD Symptoms: NA Total Time spent with patient: 15 minutes  Past Psychiatric History: Reported that she was diagnosed  with ADHD, Bipolar and Schizophrenia    Is the patient at risk to self? Yes.    Has the patient been a risk to self in the past 6 months? Yes.    Has the patient been a risk to self within the distant past? Yes.    Is the patient a risk to others? Yes.    Has the patient been a risk to others in the past 6 months? Yes.    Has the patient been a risk to others within the distant past? Yes.     Prior Inpatient Therapy:   Prior Outpatient Therapy:    Alcohol Screening: 1. How often do you have a drink containing alcohol?: Never 9. Have you or someone else been injured as a result of your drinking?: No 10. Has a relative or friend or a doctor or another health worker been concerned about your drinking or suggested you cut down?: No Alcohol Use Disorder Identification Test Final Score (AUDIT): 0 Brief Intervention: AUDIT score less than 7 or less-screening does not suggest unhealthy drinking-brief intervention not indicated Substance Abuse History in the last 12 months:  Yes.   Consequences of Substance Abuse: NA Previous Psychotropic Medications: YES Psychological Evaluations: YES Past Medical History:  Past Medical History:  Diagnosis Date  . Arm pain   . Bipolar affective disorder, currently manic, mild ([place])   . CHF (congestive heart failure) ([place])   . Eye globe  prosthesis   . HTN (hypertension)   . Hyperthyroidism   . Sinus tachycardia     Past Surgical History:  Procedure Laterality Date  . DILATION AND CURETTAGE OF UTERUS    . EYE SURGERY     Family History:  Family History  Problem Relation Age of Onset  . Hypertension Other   . Emphysema Other   . Asthma Son   . Diabetes Maternal Uncle   . Diabetes Paternal Grandmother    Family Psychiatric  History: Tobacco Screening: Have you used any form of tobacco in the last 30 days? (Cigarettes, Smokeless Tobacco, Cigars, and/or Pipes): No Social History:  History  Alcohol Use  . 0.0 oz/week    Comment: Last drink: 1/2 beer PTA     History  Drug Use  . Types: Marijuana    Comment: Pt sts "anything and everything"    Additional Social History:      Pain Medications: none Prescriptions: none Over the Counter: none History of alcohol / drug use?: No history of alcohol / drug abuse                    Allergies:   Allergies  Allergen Reactions  . Amoxil [Amoxicillin] Anaphylaxis  . Ketorolac Tromethamine Hives and Swelling  . Prenatal [B-Plex Plus] Nausea Only  . Tegretol [Carbamazepine] Other (See Comments)    Swelling, skin peeling, skin discoloration  . B-Plex Plus Nausea Only    Prenatal   . Tegretol [Carbamazepine] Swelling and Other (See Comments)    Skin peeling Skin discoloring    Lab Results: No results found for this or any previous visit (from the past 48 hour(s)).  Blood Alcohol level:  Lab Results  Component Value Date   [facility] <10 04/28/2017   ETH <5 73/53/2992    Metabolic Disorder Labs:  Lab Results  Component Value Date   HGBA1C 5.4 12/21/2014   MPG 108 12/21/2014   No results found for: PROLACTIN Lab Results  Component Value Date   CHOL 121 12/21/2014   TRIG 66 12/21/2014   HDL 53 12/21/2014   CHOLHDL 2.3 12/21/2014   VLDL 13 12/21/2014   LDLCALC 55 12/21/2014    Current Medications: Current Facility-Administered Medications   Medication Dose Route Frequency Provider Last Rate Last Dose  . acetaminophen (TYLENOL) tablet 650 mg  650 mg Oral Q6H PRN Patrecia Pour, NP      . alum & mag hydroxide-simeth (MAALOX/MYLANTA) 200-200-20 MG/5ML suspension 30 mL  30 mL Oral Q4H PRN Patrecia Pour, NP      . amantadine (SYMMETREL) capsule 100 mg  100 mg Oral BID Derrill Center, NP      . benztropine (COGENTIN) tablet 1 mg  1 mg Oral Daily Lewis, Marcy Panning, NP      . feeding supplement (ENSURE ENLIVE) (ENSURE ENLIVE) liquid 237 mL  237 mL Oral BID BM Derrill Center, NP      . hydrOXYzine (VISTARIL) capsule 25 mg  25 mg Oral TID PRN Patrecia Pour, NP      . magnesium hydroxide (MILK OF MAGNESIA) suspension 30 mL  30 mL Oral Daily PRN Patrecia Pour, NP      . mirtazapine (REMERON) tablet 15 mg  15 mg Oral QHS Derrill Center, NP      .  nicotine polacrilex (NICORETTE) gum 2 mg  2 mg Oral PRN Derrill Center, NP      . triamcinolone 0.1 % cream : eucerin cream, 1:1   Topical TID PRN Derrill Center, NP      . triamterene-hydrochlorothiazide (MAXZIDE-25) 37.5-25 MG per tablet 1 tablet  1 tablet Oral Daily Derrill Center, NP   1 tablet at 04/30/17 1129  . ziprasidone (GEODON) capsule 60 mg  60 mg Oral BID WC Patrecia Pour, NP       PTA Medications: Prescriptions Prior to Admission  Medication Sig Dispense Refill Last Dose  . amantadine (SYMMETREL) 100 MG capsule Take 1 capsule (100 mg total) by mouth 2 (two) times daily. (Patient not taking: Reported on 04/26/2017) 60 capsule 0 Unknown at Unknown time  . hydrOXYzine (VISTARIL) 25 MG capsule Take 1 capsule (25 mg total) by mouth 3 (three) times daily as needed for anxiety. (Patient not taking: Reported on 04/28/2017) 30 capsule 0 Unknown at Unknown time  . mirtazapine (REMERON) 15 MG tablet Take 1 tablet (15 mg total) by mouth at bedtime. (Patient not taking: Reported on 04/26/2017) 30 tablet 0 Unknown at Unknown time  . temazepam (RESTORIL) 7.5 MG capsule Take 1 capsule (7.5  mg total) by mouth at bedtime as needed for sleep. (Patient not taking: Reported on 04/26/2017) 30 capsule 0 Unknown at Unknown time  . traZODone (DESYREL) 100 MG tablet Take 200 mg by mouth at bedtime.   Unknown at Unknown time  . ziprasidone (GEODON) 60 MG capsule Take 1 capsule (60 mg total) by mouth 2 (two) times daily with a meal. (Patient not taking: Reported on 04/26/2017) 60 capsule 0 Unknown at Unknown time    Musculoskeletal: Strength & Muscle Tone: within normal limits Gait & Station: normal Patient leans: N/A  Psychiatric Specialty Exam: Physical Exam  Vitals reviewed. Constitutional: She appears well-developed.  Neurological: She is alert.  Psychiatric: She has a normal mood and affect.    ROS  Blood pressure (!) 160/123, pulse (!) 157, temperature 99 F (37.2 C), temperature source Oral, resp. rate 18, height 5' 4"  (1.626 m), weight 71.2 kg (157 lb), SpO2 100 %.Body mass index is 26.95 kg/m.  General Appearance: Disheveled  Eye Contact:  Fair  Speech:  Blocked and Slurred  Volume:  Increased  Mood:  Depressed and Dysphoric  Affect:  Labile  Thought Process:  Disorganized  Orientation:  Full (Time, Place, and Person)  Thought Content:  Hallucinations: None, Paranoid Ideation and Rumination  Suicidal Thoughts:  No  Homicidal Thoughts:  No  Memory:  Immediate;   Fair Remote;   Poor  Judgement:  Poor  Insight:  Shallow  Psychomotor Activity:  Restlessness  Concentration:  Concentration: Poor  Recall:  [employer] of Knowledge:  Fair  Language:  Fair  Akathisia:  No  Handed:  Right  AIMS (if indicated):     Assets:  Communication Skills Desire for Improvement Resilience Social Support  ADL's:  Intact  Cognition:  WNL  Sleep:       Treatment Plan Summary: Daily contact with patient to assess and evaluate symptoms and progress in treatment and Medication management   See SRA for medication management   Will continue to monitor vitals ,medication  compliance and treatment side effects while patient is here.  Reviewed labs,BAL - , UDS -  - TSH, Lipid, Prolaction and EKG pending results CSW will start working on disposition.  Patient to participate in therapeutic milieu  Observation  Level/Precautions:  15 minute checks  Laboratory:  CBC Chemistry Profile UDS UA  Psychotherapy:  Individual and group session  Medications:  See SRA by MD  Consultations:  CSW and Psychiatry  Discharge Concerns:  Safety, stabilization, and risk of access to medication and medication stabilization   Estimated LOS: 5-7days  Other:     Physician Treatment Plan for Primary Diagnosis: Bipolar affective disorder, manic, severe ([place]) Long Term Goal(s): Improvement in symptoms so as ready for discharge  Short Term Goals: Ability to identify changes in lifestyle to reduce recurrence of condition will improve, Ability to verbalize feelings will improve and Ability to demonstrate self-control will improve  Physician Treatment Plan for Secondary Diagnosis: Principal Problem:   Bipolar affective disorder, manic, severe ([place])  Long Term Goal(s): Improvement in symptoms so as ready for discharge  Short Term Goals: Ability to identify and develop effective coping behaviors will improve, Ability to maintain clinical measurements within normal limits will improve and Compliance with prescribed medications will improve  I certify that inpatient services furnished can reasonably be expected to improve the patient's condition.    Derrill Center, NP 11/3/201812:04 PM   I have reviewed case with NP and have met with patient Agree with NP assessment  Patient is a 41 year old female, admitted under commitment. As per chart notes, presented with disorganized, agitated behaviors in ED, such as disrobing  singing at top of her voice , threatening, and exhibiting  disorganized speech and thought process .  Patient has history of Schiozaffective Disorder and prior  psychiatric admissions . She also has history of cocaine abuse, but admission UDS was negative, admission BAL negative.  Patient denies alcohol abuse, states she drinks on occasion. Required medications for agitation in ED. As per chart patient had been prescribed Remeron, Geodon, Restoril, but was not taking medications prior to admission. Today patient presents calmer, not loud or threatening , but still disorganized, labile. States " I don't need to be here, I need to be in the field with my son", " they told me I came for STD".  Medical History is remarkable for HTN and DM ( of note, chart notes do not indicate DM and glucose is WNL)   Dx- Schizoaffective Disorder   Plan - Inpatient admission. Patient received Geodon in ED, and has been on this medication in the past  11/2 EKG QTc 460. She states she has been on Zyprexa in the past, would consider starting this medication at present for antipsychotic and sedative effects. Patient states she has taken Symmetrel in the past for EPS and denies side effects. Start Zyprexa 5 mgrs BID Start Ativan 1 mgrs  Q 6 hours PRN for anxiety or agitation Patient reports she has been on HCTZ for HTN management . Obtain routine labs- repeat EKG

## 2017-04-30 NOTE — Tx Team (Signed)
Initial Treatment Plan 04/30/2017 12:04 PM Christine Cobb Christine Cobb QQP:[id number]    PATIENT STRESSORS: Health problems Medication change or noncompliance   PATIENT STRENGTHS: Communication skills General fund of knowledge   PATIENT IDENTIFIED PROBLEMS: Anxious    Manic    "I don't know why I am here, if ya'll give me my medication I will be fine"             DISCHARGE CRITERIA:  Need for constant or close observation no longer present  PRELIMINARY DISCHARGE PLAN: Return to previous living arrangement  PATIENT/FAMILY INVOLVEMENT: This treatment plan has been presented to and reviewed with the patient, Christine Cobb, and/or family member.  The patient and family have been given the opportunity to ask questions and make suggestions.  Buford Dresser, RN 04/30/2017, 12:04 PM

## 2017-05-01 ENCOUNTER — Other Ambulatory Visit: Payer: Self-pay

## 2017-05-01 DIAGNOSIS — F17210 Nicotine dependence, cigarettes, uncomplicated: Secondary | ICD-10-CM

## 2017-05-01 DIAGNOSIS — G4700 Insomnia, unspecified: Secondary | ICD-10-CM

## 2017-05-01 DIAGNOSIS — F1210 Cannabis abuse, uncomplicated: Secondary | ICD-10-CM

## 2017-05-01 DIAGNOSIS — F1910 Other psychoactive substance abuse, uncomplicated: Secondary | ICD-10-CM

## 2017-05-01 DIAGNOSIS — R443 Hallucinations, unspecified: Secondary | ICD-10-CM

## 2017-05-01 DIAGNOSIS — F39 Unspecified mood [affective] disorder: Secondary | ICD-10-CM

## 2017-05-01 MED ORDER — OLANZAPINE 5 MG PO TBDP
ORAL_TABLET | ORAL | Status: AC
  Administered 2017-05-01: 5 mg
  Filled 2017-05-01: qty 1

## 2017-05-01 MED ORDER — TRAZODONE HCL 50 MG PO TABS
50.0000 mg | ORAL_TABLET | Freq: Every evening | ORAL | Status: DC | PRN
  Administered 2017-05-01 – 2017-05-03 (×3): 50 mg via ORAL
  Filled 2017-05-01 (×11): qty 1

## 2017-05-01 MED ORDER — LORAZEPAM 0.5 MG PO TABS: 0.5000 mg | ORAL_TABLET | Freq: Two times a day (BID) | ORAL | Status: DC

## 2017-05-01 MED ORDER — LORAZEPAM 1 MG PO TABS
ORAL_TABLET | ORAL | Status: AC
Start: 2017-05-01 — End: 2017-05-01
  Filled 2017-05-01: qty 1

## 2017-05-01 MED ORDER — OLANZAPINE 5 MG PO TABS
5.0000 mg | ORAL_TABLET | Freq: Two times a day (BID) | ORAL | Status: DC
  Administered 2017-05-01 – 2017-05-04 (×6): 5 mg via ORAL
  Filled 2017-05-01 (×7): qty 1
  Filled 2017-05-01: qty 2
  Filled 2017-05-01 (×3): qty 1

## 2017-05-01 MED ORDER — LORAZEPAM 1 MG PO TABS
1.0000 mg | ORAL_TABLET | Freq: Four times a day (QID) | ORAL | Status: DC | PRN
  Administered 2017-05-01 – 2017-05-03 (×5): 1 mg via ORAL
  Filled 2017-05-01 (×5): qty 1

## 2017-05-01 MED ORDER — CALCIUM CARBONATE ANTACID 500 MG PO CHEW
1.0000 | CHEWABLE_TABLET | Freq: Two times a day (BID) | ORAL | Status: DC
  Administered 2017-05-01 – 2017-05-09 (×16): 200 mg via ORAL
  Filled 2017-05-01 (×22): qty 1

## 2017-05-01 NOTE — Progress Notes (Signed)
Pt up and in hall demanding a list of medications of which many are not due.  Pt is very irritable, rude and sts she requires what she needs.  Pt verbally contracts for safety and sts she is in no pain currently.  "That tylenol dont work anyway.  Im used to 1000 mg".  Pt request something for sleep. Pt receives drink, sleep meds and skin cream.   Pt returns to bed and is currently asleep.
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
## 2017-05-01 NOTE — Progress Notes (Signed)
DAR Note: Patient irritable and demanding this evening. Requesting to get shot for "geodon, Ativan,Benadryl, xanax, Seroquel" Patient tangential and argumentative with this Clinical research associate. Blaming people around her. Reports back pain of 8/10. Awake most of the night frequenting the nurses station demanding one thing or the other. Denies SI/HI, AH/VH at this time. Patient repeats this statement anytime she was asked question "I know my body and I know what is going on with me. I need a shot". Patient difficult to redirect. At the nurses station at this time. Will continue to monitor patient.

## 2017-05-01 NOTE — BHH Group Notes (Signed)
[facility] LCSW Group Therapy Note  Date/Time:  05/01/2017  11:00AM-12:00PM  Type of Therapy and Topic:  Group Therapy:  Music and Mood  Participation Level:  Active   Description of Group: In this process group, members listened to a variety of genres of music and identified that different types of music evoke different responses.  Patients were encouraged to identify music that was soothing for them and music that was energizing for them.  Patients discussed how this knowledge can help with wellness and recovery in various ways including managing depression and anxiety as well as encouraging healthy sleep habits.    Therapeutic Goals: 1. Patients will explore the impact of different varieties of music on mood 2. Patients will verbalize the thoughts they have when listening to different types of music 3. Patients will identify music that is soothing to them as well as music that is energizing to them 4. Patients will discuss how to use this knowledge to assist in maintaining wellness and recovery 5. Patients will explore the use of music as a coping skill  Summary of Patient Progress:  At the beginning of group, patient expressed that she was feeling good.  She danced, sang and prayed out loud many times throughout group, was disorganized and intrusive but fairly easy to redirect.  Therapeutic Modalities: Solution Focused Brief Therapy Motivational Interviewing Activity   Ambrose Mantle, LCSW 05/01/2017 12:27 PM

## 2017-05-01 NOTE — Progress Notes (Signed)
[facility] MD Progress Note  05/01/2017 10:54 AM Christine Cobb  MRN:  [id number]    Subjective:  Christine Cobb reports " I am having a great day"  Patient seen standing at the nursing station interacting with staff. Reports feeling better. 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   Patient is pleasant and less tangential this morning. Patient continues to reports delusion.  Spoke to patient and has agreed to vitals 137/83 HR 68 RR 18 sat 100%RM. Reports taken medication as ordered and states she is tolerating medication well. Supports, Encouragement  and Reassurance was provided.    Principal Problem: Bipolar affective disorder, manic, severe (HCC) Diagnosis:   Patient Active Problem List   Diagnosis Date Noted  . Bipolar affective disorder, manic, severe (HCC) [F31.13] 04/30/2017  . Severe bipolar affective disorder with psychosis (HCC) [F31.89] 04/29/2017  . Schizoaffective disorder, bipolar type (HCC) [F25.0]   . Cocaine abuse (HCC) [F14.10] 12/31/2014  . Bipolar disorder, current episode manic without psychotic features, severe (HCC) [F31.13]   . Agitation [R45.1]   . Manic behavior (HCC) [F30.10]   . Hyperthyroidism [E05.90] 09/21/2013  . Marijuana abuse [F12.10] 04/12/2013  . History of CHF (congestive heart failure) [Z86.79] 02/01/2013  . Abscess of abdominal wall [L02.211] 10/18/2012  . HTN (hypertension) [I10] 04/08/2011  . Tachycardia [R00.0] 04/08/2011   Total Time spent with patient: 30 minutes  Past Psychiatric History:   Past Medical History:  Past Medical History:  Diagnosis Date  . Arm pain   . Bipolar affective disorder, currently manic, mild (HCC)   . CHF (congestive heart failure) (HCC)   . Eye globe prosthesis   . HTN (hypertension)   . Hyperthyroidism   . Sinus tachycardia     Past Surgical History:  Procedure Laterality Date  . DILATION AND CURETTAGE OF UTERUS    . EYE SURGERY     Family History:   Family History  Problem Relation Age of Onset  . Hypertension Other   . Emphysema Other   . Asthma Son   . Diabetes Maternal Uncle   . Diabetes Paternal Grandmother    Family Psychiatric  History:  Social History:  Social History   Substance and Sexual Activity  Alcohol Use Yes  . Alcohol/week: 0.0 oz   Comment: Last drink: 1/2 beer PTA     Social History   Substance and Sexual Activity  Drug Use Yes  . Types: Marijuana   Comment: Pt sts "anything and everything"    Social History   Socioeconomic History  . Marital status: Married    Spouse name: None  . Number of children: None  . Years of education: None  . Highest education level: None  Social Needs  . Financial resource strain: None  . Food insecurity - worry: None  . Food insecurity - inability: None  . Transportation needs - medical: None  . Transportation needs - non-medical: None  Occupational History  . None  Tobacco Use  . Smoking status: Current Every Day Smoker    Types: Cigarettes    Last attempt to quit: 08/21/2013    Years since quitting: 3.6  . Smokeless tobacco: Never Used  Substance and Sexual Activity  . Alcohol use: Yes    Alcohol/week: 0.0 oz    Comment: Last drink: 1/2 beer PTA  . Drug use: Yes    Types: Marijuana    Comment: Pt sts "anything and everything"  . Sexual activity: Yes  Birth control/protection: Injection    Comment: crack cocaine  Other Topics Concern  . None  Social History Narrative  . None   Additional Social History:    Pain Medications: none Prescriptions: none Over the Counter: none History of alcohol / drug use?: No history of alcohol / drug abuse                    Sleep: Fair  Appetite:  Fair  Current Medications: Current Facility-Administered Medications  Medication Dose Route Frequency Provider Last Rate Last Dose  . acetaminophen (TYLENOL) tablet 650 mg  650 mg Oral Q6H PRN Charm Rings, NP   650 mg at 05/01/17 1046  . alum & mag  hydroxide-simeth (MAALOX/MYLANTA) 200-200-20 MG/5ML suspension 30 mL  30 mL Oral Q4H PRN Charm Rings, NP   30 mL at 05/01/17 1049  . amantadine (SYMMETREL) capsule 100 mg  100 mg Oral BID Oneta Rack, NP   100 mg at 05/01/17 0737  . calcium carbonate (TUMS - dosed in mg elemental calcium) chewable tablet 200 mg of elemental calcium  1 tablet Oral BID Oneta Rack, NP      . feeding supplement (ENSURE ENLIVE) (ENSURE ENLIVE) liquid 237 mL  237 mL Oral BID BM Oneta Rack, NP   237 mL at 05/01/17 1047  . hydrOXYzine (ATARAX/VISTARIL) tablet 25 mg  25 mg Oral TID PRN Dilynn Munroe, Rockey Situ, MD   25 mg at 05/01/17 0740  . magnesium hydroxide (MILK OF MAGNESIA) suspension 30 mL  30 mL Oral Daily PRN Charm Rings, NP      . mirtazapine (REMERON) tablet 15 mg  15 mg Oral QHS Oneta Rack, NP   15 mg at 04/30/17 2120  . nicotine polacrilex (NICORETTE) gum 2 mg  2 mg Oral PRN Oneta Rack, NP   2 mg at 05/01/17 1046  . triamcinolone 0.1 % cream : eucerin cream, 1:1   Topical TID PRN Oneta Rack, NP      . triamterene-hydrochlorothiazide (MAXZIDE-25) 37.5-25 MG per tablet 1 tablet  1 tablet Oral Daily Oneta Rack, NP   1 tablet at 05/01/17 2111    Lab Results: No results found for this or any previous visit (from the past 48 hour(s)).  Blood Alcohol level:  Lab Results  Component Value Date   ETH <10 04/28/2017   ETH <5 10/22/2016    Metabolic Disorder Labs: Lab Results  Component Value Date   HGBA1C 5.4 12/21/2014   MPG 108 12/21/2014   No results found for: PROLACTIN Lab Results  Component Value Date   CHOL 121 12/21/2014   TRIG 66 12/21/2014   HDL 53 12/21/2014   CHOLHDL 2.3 12/21/2014   VLDL 13 12/21/2014   LDLCALC 55 12/21/2014    Physical Findings: AIMS:  , ,  ,  ,    CIWA:    COWS:     Musculoskeletal: Strength & Muscle Tone: within normal limits Gait & Station: normal Patient leans: N/A  Psychiatric Specialty Exam: Physical Exam  Nursing note  and vitals reviewed. Constitutional: She is oriented to person, place, and time. She appears well-developed.  Cardiovascular: Normal rate.  Neurological: She is alert and oriented to person, place, and time.  Skin: Skin is warm.  Psychiatric: She has a normal mood and affect. Her behavior is normal.    Review of Systems  Psychiatric/Behavioral: Positive for hallucinations and substance abuse.    Blood pressure 139/83, pulse 72,  temperature 99 F (37.2 C), temperature source Oral, resp. rate 18, height 5\' 4"  (1.626 m), weight 71.2 kg (157 lb), SpO2 100 %.Body mass index is 26.95 kg/m.  General Appearance: Disheveled  Eye Contact:  Fair  Speech:  Clear and Coherent  Volume:  Normal  Mood:  Anxious and Depressed  Affect:  Congruent  Thought Process:  Coherent  Orientation:  Full (Time, Place, and Person)  Thought Content:  Delusions, Paranoid Ideation and Rumination  Suicidal Thoughts:  No  Homicidal Thoughts:  No  Memory:  Immediate;   Fair Recent;   Fair Remote;   Fair  Judgement:  Fair  Insight:  Fair  Psychomotor Activity:  Normal  Concentration:  Concentration: Fair  Recall:  [employer] of Knowledge:  Fair  Language:  Fair  Akathisia:  No  Handed:  Right  AIMS (if indicated):     Assets:  Communication Skills Desire for Improvement Social Support  ADL's:  Intact  Cognition:  WNL  Sleep:  Number of Hours: 2     Treatment Plan Summary: Daily contact with patient to assess and evaluate symptoms and progress in treatment and Medication management   Start Zyprexa 5 mg BID and Ativan 1 mg PRN Q 6, for mood stabilization. Continue with Remeron 15 mg  mg for insomnia  Will continue to monitor vitals, medication compliance and treatment side effects while patient is here.  Reviewed labs: BAL - , UDS -  CSW will start working on disposition.  Patient to participate in therapeutic milieu   Oneta Rack, NP 05/01/2017, 10:54 AM   Agree with NP Progress Note

## 2017-05-02 DIAGNOSIS — R451 Restlessness and agitation: Secondary | ICD-10-CM

## 2017-05-02 NOTE — [facility] Group Notes (Signed)
LCSW Group Therapy Note   05/02/2017 1:15pm   Type of Therapy and Topic:  Group Therapy:  Overcoming Obstacles   Participation Level:  Active   Description of Group:    In this group patients will be encouraged to explore what they see as obstacles to their own wellness and recovery. They will be guided to discuss their thoughts, feelings, and behaviors related to these obstacles. The group will process together ways to cope with barriers, with attention given to specific choices patients can make. Each patient will be challenged to identify changes they are motivated to make in order to overcome their obstacles. This group will be process-oriented, with patients participating in exploration of their own experiences as well as giving and receiving support and challenge from other group members.   Therapeutic Goals: 1. Patient will identify personal and current obstacles as they relate to admission. 2. Patient will identify barriers that currently interfere with their wellness or overcoming obstacles.  3. Patient will identify feelings, thought process and behaviors related to these barriers. 4. Patient will identify two changes they are willing to make to overcome these obstacles:      Summary of Patient Progress   Angeliki stayed the entire time, engaged for the most part, but drifted off to sleep at one point.  "I'm not used to this medication."  Talked about goals for herself which include school, becoming a Archivist, opening a day care, and being a good mother for her 39 year old.  Talked of services which she has been getting which include therapy, CST and medication management.  Alluded to the father of her 62 year old who "is 27 years old, and he looks as good as you do."  Mood good, no outbursts.   Therapeutic Modalities:   Cognitive Behavioral Therapy Solution Focused Therapy Motivational Interviewing Relapse Prevention Therapy  Ida Rogue, LCSW 05/02/2017 2:10 PM

## 2017-05-02 NOTE — Progress Notes (Signed)
[facility] MD Progress Note  05/02/2017 3:36 PM Christine Cobb  MRN:  [id number] Subjective:   Christine Cobb is a 41 y/o F with history of bipolar disorder who was admitted with worsening symptoms of agitation and mania. Today, pt continues to be agitated and irritable. She is difficult to redirect. Earlier today, she obtained latex gloves during recreation time and she was difficult to redirect by staff and she began yelling. Upon interview, pt is perseverative about missing a "diamond ring." She is pressured and irritable. When attempting to redirect her to talk about her symptoms or the narrative regarding her presentation to the hospital, pt grew increasingly irritable and repeated, "I'm good - y'all can go now." She denied SI/HI/AH/VH, and she declined to answer and further questions. Attempted to discuss with patient that we would like to address her medications; however, pt would not respond and instead repeated, "I'm good - that's it. Thank you, doctor." As pt was beginning to escalate, the interview was concluded.  Principal Problem: Bipolar affective disorder, manic, severe (HCC) Diagnosis:   Patient Active Problem List   Diagnosis Date Noted  . Bipolar affective disorder, manic, severe (HCC) [F31.13] 04/30/2017  . Severe bipolar affective disorder with psychosis (HCC) [F31.89] 04/29/2017  . Schizoaffective disorder, bipolar type (HCC) [F25.0]   . Cocaine abuse (HCC) [F14.10] 12/31/2014  . Bipolar disorder, current episode manic without psychotic features, severe (HCC) [F31.13]   . Agitation [R45.1]   . Manic behavior (HCC) [F30.10]   . Hyperthyroidism [E05.90] 09/21/2013  . Marijuana abuse [F12.10] 04/12/2013  . History of CHF (congestive heart failure) [Z86.79] 02/01/2013  . Abscess of abdominal wall [L02.211] 10/18/2012  . HTN (hypertension) [I10] 04/08/2011  . Tachycardia [R00.0] 04/08/2011   Total Time spent with patient: 15 minutes  Past Psychiatric History: see  H&P  Past Medical History:  Past Medical History:  Diagnosis Date  . Arm pain   . Bipolar affective disorder, currently manic, mild (HCC)   . CHF (congestive heart failure) (HCC)   . Eye globe prosthesis   . HTN (hypertension)   . Hyperthyroidism   . Sinus tachycardia     Past Surgical History:  Procedure Laterality Date  . DILATION AND CURETTAGE OF UTERUS    . EYE SURGERY     Family History:  Family History  Problem Relation Age of Onset  . Hypertension Other   . Emphysema Other   . Asthma Son   . Diabetes Maternal Uncle   . Diabetes Paternal Grandmother    Family Psychiatric  History: see H&P Social History:  Social History   Substance and Sexual Activity  Alcohol Use Yes  . Alcohol/week: 0.0 oz   Comment: Last drink: 1/2 beer PTA     Social History   Substance and Sexual Activity  Drug Use Yes  . Types: Marijuana   Comment: Pt sts "anything and everything"    Social History   Socioeconomic History  . Marital status: Married    Spouse name: None  . Number of children: None  . Years of education: None  . Highest education level: None  Social Needs  . Financial resource strain: None  . Food insecurity - worry: None  . Food insecurity - inability: None  . Transportation needs - medical: None  . Transportation needs - non-medical: None  Occupational History  . None  Tobacco Use  . Smoking status: Current Every Day Smoker    Types: Cigarettes    Last attempt to quit: 08/21/2013  Years since quitting: 3.6  . Smokeless tobacco: Never Used  Substance and Sexual Activity  . Alcohol use: Yes    Alcohol/week: 0.0 oz    Comment: Last drink: 1/2 beer PTA  . Drug use: Yes    Types: Marijuana    Comment: Pt sts "anything and everything"  . Sexual activity: Yes    Birth control/protection: Injection    Comment: crack cocaine  Other Topics Concern  . None  Social History Narrative  . None   Additional Social History:    Pain Medications:  none Prescriptions: none Over the Counter: none History of alcohol / drug use?: No history of alcohol / drug abuse                    Sleep: Fair  Appetite:  Fair  Current Medications: Current Facility-Administered Medications  Medication Dose Route Frequency Provider Last Rate Last Dose  . acetaminophen (TYLENOL) tablet 650 mg  650 mg Oral Q6H PRN Charm RingsLord, Jamison Y, NP   650 mg at 05/02/17 1452  . alum & mag hydroxide-simeth (MAALOX/MYLANTA) 200-200-20 MG/5ML suspension 30 mL  30 mL Oral Q4H PRN Charm RingsLord, Jamison Y, NP   30 mL at 05/01/17 1049  . amantadine (SYMMETREL) capsule 100 mg  100 mg Oral BID Oneta RackLewis, Tanika N, NP   100 mg at 05/02/17 0816  . calcium carbonate (TUMS - dosed in mg elemental calcium) chewable tablet 200 mg of elemental calcium  1 tablet Oral BID Oneta RackLewis, Tanika N, NP   200 mg of elemental calcium at 05/02/17 1023  . feeding supplement (ENSURE ENLIVE) (ENSURE ENLIVE) liquid 237 mL  237 mL Oral BID BM Oneta RackLewis, Tanika N, NP   237 mL at 05/02/17 1452  . hydrOXYzine (ATARAX/VISTARIL) tablet 25 mg  25 mg Oral TID PRN Cobos, Rockey SituFernando A, MD   25 mg at 05/02/17 0821  . LORazepam (ATIVAN) tablet 1 mg  1 mg Oral Q6H PRN Oneta RackLewis, Tanika N, NP   1 mg at 05/01/17 2006  . magnesium hydroxide (MILK OF MAGNESIA) suspension 30 mL  30 mL Oral Daily PRN Charm RingsLord, Jamison Y, NP      . mirtazapine (REMERON) tablet 15 mg  15 mg Oral QHS Oneta RackLewis, Tanika N, NP   15 mg at 05/01/17 2133  . nicotine polacrilex (NICORETTE) gum 2 mg  2 mg Oral PRN Oneta RackLewis, Tanika N, NP   2 mg at 05/02/17 1453  . OLANZapine (ZYPREXA) tablet 5 mg  5 mg Oral BID Oneta RackLewis, Tanika N, NP   5 mg at 05/02/17 0816  . traZODone (DESYREL) tablet 50 mg  50 mg Oral QHS,MR X 1 Oneta RackLewis, Tanika N, NP   50 mg at 05/01/17 2133  . triamcinolone 0.1 % cream : eucerin cream, 1:1   Topical TID PRN Oneta RackLewis, Tanika N, NP   1 application at 05/01/17 1257  . triamterene-hydrochlorothiazide (MAXZIDE-25) 37.5-25 MG per tablet 1 tablet  1 tablet Oral Daily  Oneta RackLewis, Tanika N, NP   1 tablet at 05/02/17 [id number]    Lab Results: No results found for this or any previous visit (from the past 48 hour(s)).  Blood Alcohol level:  Lab Results  Component Value Date   [facility] <10 04/28/2017   ETH <5 10/22/2016    Metabolic Disorder Labs: Lab Results  Component Value Date   HGBA1C 5.4 12/21/2014   MPG 108 12/21/2014   No results found for: PROLACTIN Lab Results  Component Value Date   CHOL 121 12/21/2014  TRIG 66 12/21/2014   HDL 53 12/21/2014   CHOLHDL 2.3 12/21/2014   VLDL 13 12/21/2014   LDLCALC 55 12/21/2014    Physical Findings: AIMS:  , ,  ,  ,    CIWA:    COWS:     Musculoskeletal: Strength & Muscle Tone: within normal limits Gait & Station: normal Patient leans: N/A  Psychiatric Specialty Exam: Physical Exam  Nursing note and vitals reviewed.   Review of Systems  Constitutional: Negative for chills and fever.  Respiratory: Negative for cough.   Neurological: Negative for dizziness.    Blood pressure 98/63, pulse 79, temperature 98.2 F (36.8 C), temperature source Oral, resp. rate 16, height 5\' 4"  (1.626 m), weight 71.2 kg (157 lb), SpO2 100 %.Body mass index is 26.95 kg/m.  General Appearance: Casual  Eye Contact:  Fair  Speech:  Pressured  Volume:  Increased  Mood:  Angry, Dysphoric and Irritable  Affect:  Congruent and Labile  Thought Process:  Disorganized  Orientation:  Full (Time, Place, and Person)  Thought Content:  Illogical, Paranoid Ideation and Rumination  Suicidal Thoughts:  No  Homicidal Thoughts:  No  Memory:  Immediate;   Fair Recent;   Fair Remote;   Fair  Judgement:  Impaired  Insight:  Lacking  Psychomotor Activity:  Normal  Concentration:  Concentration: Poor  Recall:  [employer] of Knowledge:  Fair  Language:  Fair  Akathisia:  No  Handed:    AIMS (if indicated):     Assets:  Communication Skills Leisure Time Physical Health Resilience Social Support  ADL's:  Intact  Cognition:   WNL  Sleep:  Number of Hours: 6     Treatment Plan Summary: Daily contact with patient to assess and evaluate symptoms and progress in treatment and Medication management. Pt continues to have symptoms of agitation and irritability. She is not willing to engage in the interview, but she has been compliant with her prescribed medications. - Continue inpatient hospitalization - Bipolar I, current episode manic  - Continue zyprexa 5mg  BID  - Continue ativan 1mg  PRN q6h prn agitation  - Continue Remeron 15mg  qhs - Increase collateral information - Discharge planning will be ongoing  Micheal Likens, MD 05/02/2017, 3:36 PM

## 2017-05-02 NOTE — Progress Notes (Signed)
Recreation Therapy Notes  Date: 05/02/17 Time: 1015 Location: 500 [address] Dayroom  Group Topic: Leisure Education  Goal Area(s) Addresses:  Patient will identify positive leisure activities.  Patient will identify one positive benefit of participation in leisure activities.   Behavioral Response: Engaged  Intervention: Scientist, clinical (histocompatibility and immunogenetics), markers, glue sticks, scissors  Activity: Leisure Science writer.  Patients were to create a brochure that highlighted their leisure interests.  Patients were to also give a brief description of why they chose the activities they did.  Education:  Leisure Education, Building control surveyor  Education Outcome: Acknowledges education/In group clarification offered/Needs additional education  Clinical Observations/Feedback: Pt arrived late to group.  Pt expressed leisure helps "reach goals, gives you a positive attitude, allows you to fellowship with people and helps you get a job/own place".  Pt also stated when you don't do leisure "you get upset and take it out on other people".  Pt expressed most people don't do leisure because they procrastinate too much and don't prioritize.    Caroll Rancher, LRT/CTRS         Caroll Rancher A 05/02/2017 12:02 PM

## 2017-05-02 NOTE — Progress Notes (Signed)
DAR NOTE: Patient presents with irritable affect and agitated mood.  Denies suicidal thoughts, auditory and visual hallucinations.  Rates depression at 7, hopelessness at 0, and anxiety at 4.  Maintained on routine safety checks.  Medications given as prescribed.  Support and encouragement offered as needed.  Attended group and participated.  States goal for today is "to get back .  Patient was verbally aggressive and abusive towards staff when redirected about unit protocol/safety.  Patient placed on unit restriction for safety.

## 2017-05-03 MED ORDER — BIOTENE DRY MOUTH MT LIQD
15.0000 mL | OROMUCOSAL | Status: DC | PRN
  Administered 2017-05-05 – 2017-05-09 (×3): 15 mL via OROMUCOSAL
  Filled 2017-05-03: qty 237

## 2017-05-03 MED ORDER — HYDROCORTISONE 0.5 % EX CREA
TOPICAL_CREAM | CUTANEOUS | Status: AC
  Filled 2017-05-03: qty 28.35

## 2017-05-03 MED ORDER — HYDROXYZINE HCL 50 MG PO TABS
50.0000 mg | ORAL_TABLET | Freq: Three times a day (TID) | ORAL | Status: DC | PRN
  Administered 2017-05-03 – 2017-05-09 (×5): 50 mg via ORAL
  Filled 2017-05-03 (×3): qty 1
  Filled 2017-05-03: qty 10
  Filled 2017-05-03 (×4): qty 1

## 2017-05-03 NOTE — Progress Notes (Signed)
Patient denies SI, HI and AVH.  Patient has had multiple somatic complaints this shift and has asked for various medications that she is not prescribed.  Patient is easily agitated when she can not get what she is asking for even when offered and alternative.  Patient as been pacing the halls and making multiple insulting comments toward staff requiring much redirection.   Assess patient for safety, offer medications as prescribed, engage patient in 1:1 staff talks.   Patient able to contract for safety, continue to monitor as planned.

## 2017-05-03 NOTE — Tx Team (Signed)
Interdisciplinary Treatment and Diagnostic Plan Update  05/03/2017 Time of Session: 9:47 AM  Christine Cobb MRN: [id number]  Principal Diagnosis: Bipolar affective disorder, manic, severe ( [place])  Secondary Diagnoses: Principal Problem:   Bipolar affective disorder, manic, severe ([place])   Current Medications:  Current Facility-Administered Medications  Medication Dose Route Frequency Provider Last Rate Last Dose  . acetaminophen (TYLENOL) tablet 650 mg  650 mg Oral Q6H PRN Patrecia Pour, NP   650 mg at 05/03/17 9417  . alum & mag hydroxide-simeth (MAALOX/MYLANTA) 200-200-20 MG/5ML suspension 30 mL  30 mL Oral Q4H PRN Patrecia Pour, NP   30 mL at 05/01/17 1049  . amantadine (SYMMETREL) capsule 100 mg  100 mg Oral BID Derrill Center, NP   100 mg at 05/03/17 0814  . calcium carbonate (TUMS - dosed in mg elemental calcium) chewable tablet 200 mg of elemental calcium  1 tablet Oral BID Derrill Center, NP   200 mg of elemental calcium at 05/03/17 0814  . feeding supplement (ENSURE ENLIVE) (ENSURE ENLIVE) liquid 237 mL  237 mL Oral BID BM Derrill Center, NP   237 mL at 05/03/17 0814  . hydrOXYzine (ATARAX/VISTARIL) tablet 25 mg  25 mg Oral TID PRN Cobos, Myer Peer, MD   25 mg at 05/02/17 0821  . LORazepam (ATIVAN) tablet 1 mg  1 mg Oral Q6H PRN Derrill Center, NP   1 mg at 05/03/17 0337  . magnesium hydroxide (MILK OF MAGNESIA) suspension 30 mL  30 mL Oral Daily PRN Patrecia Pour, NP      . mirtazapine (REMERON) tablet 15 mg  15 mg Oral QHS Derrill Center, NP   15 mg at 05/01/17 2133  . nicotine polacrilex (NICORETTE) gum 2 mg  2 mg Oral PRN Derrill Center, NP   2 mg at 05/03/17 0815  . OLANZapine (ZYPREXA) tablet 5 mg  5 mg Oral BID Derrill Center, NP   5 mg at 05/03/17 0814  . traZODone (DESYREL) tablet 50 mg  50 mg Oral QHS,MR X 1 Derrill Center, NP   50 mg at 05/01/17 2133  . triamcinolone 0.1 % cream : eucerin cream, 1:1   Topical TID PRN Derrill Center, NP   1  application at 40/81/44 1257  . triamterene-hydrochlorothiazide (MAXZIDE-25) 37.5-25 MG per tablet 1 tablet  1 tablet Oral Daily Derrill Center, NP   1 tablet at 05/03/17 8185    PTA Medications: Medications Prior to Admission  Medication Sig Dispense Refill Last Dose  . amantadine (SYMMETREL) 100 MG capsule Take 1 capsule (100 mg total) by mouth 2 (two) times daily. (Patient not taking: Reported on 04/26/2017) 60 capsule 0 Unknown at Unknown time  . hydrOXYzine (VISTARIL) 25 MG capsule Take 1 capsule (25 mg total) by mouth 3 (three) times daily as needed for anxiety. (Patient not taking: Reported on 04/28/2017) 30 capsule 0 Unknown at Unknown time  . mirtazapine (REMERON) 15 MG tablet Take 1 tablet (15 mg total) by mouth at bedtime. (Patient not taking: Reported on 04/26/2017) 30 tablet 0 Unknown at Unknown time  . temazepam (RESTORIL) 7.5 MG capsule Take 1 capsule (7.5 mg total) by mouth at bedtime as needed for sleep. (Patient not taking: Reported on 04/26/2017) 30 capsule 0 Unknown at Unknown time  . traZODone (DESYREL) 100 MG tablet Take 200 mg by mouth at bedtime.   Unknown at Unknown time  . ziprasidone (GEODON) 60 MG capsule Take 1 capsule (60 mg  total) by mouth 2 (two) times daily with a meal. (Patient not taking: Reported on 04/26/2017) 60 capsule 0 Unknown at Unknown time    Patient Stressors: Health problems Medication change or noncompliance  Patient Strengths: Curator fund of knowledge  Treatment Modalities: Medication Management, Group therapy, Case management,  1 to 1 session with clinician, Psychoeducation, Recreational therapy.   Physician Treatment Plan for Primary Diagnosis: Bipolar affective disorder, manic, severe ([place]) Long Term Goal(s): Improvement in symptoms so as ready for discharge  Short Term Goals: Ability to identify changes in lifestyle to reduce recurrence of condition will improve Ability to verbalize feelings will improve Ability  to demonstrate self-control will improve Ability to identify and develop effective coping behaviors will improve Ability to maintain clinical measurements within normal limits will improve Compliance with prescribed medications will improve  Medication Management: Evaluate patient's response, side effects, and tolerance of medication regimen.  Therapeutic Interventions: 1 to 1 sessions, Unit Group sessions and Medication administration.  Evaluation of Outcomes: Progressing  Physician Treatment Plan for Secondary Diagnosis: Principal Problem:   Bipolar affective disorder, manic, severe ([place])   Long Term Goal(s): Improvement in symptoms so as ready for discharge  Short Term Goals: Ability to identify changes in lifestyle to reduce recurrence of condition will improve Ability to verbalize feelings will improve Ability to demonstrate self-control will improve Ability to identify and develop effective coping behaviors will improve Ability to maintain clinical measurements within normal limits will improve Compliance with prescribed medications will improve  Medication Management: Evaluate patient's response, side effects, and tolerance of medication regimen.  Therapeutic Interventions: 1 to 1 sessions, Unit Group sessions and Medication administration.  Evaluation of Outcomes: Progressing   RN Treatment Plan for Primary Diagnosis: Bipolar affective disorder, manic, severe ([place]) Long Term Goal(s): Knowledge of disease and therapeutic regimen to maintain health will improve  Short Term Goals: Ability to identify and develop effective coping behaviors will improve and Compliance with prescribed medications will improve  Medication Management: RN will administer medications as ordered by provider, will assess and evaluate patient's response and provide education to patient for prescribed medication. RN will report any adverse and/or side effects to prescribing provider.  Therapeutic  Interventions: 1 on 1 counseling sessions, Psychoeducation, Medication administration, Evaluate responses to treatment, Monitor vital signs and CBGs as ordered, Perform/monitor CIWA, COWS, AIMS and Fall Risk screenings as ordered, Perform wound care treatments as ordered.  Evaluation of Outcomes: Progressing   LCSW Treatment Plan for Primary Diagnosis: Bipolar affective disorder, manic, severe ([place]) Long Term Goal(s): Safe transition to appropriate next level of care at discharge, Engage patient in therapeutic group addressing interpersonal concerns.  Short Term Goals: Engage patient in aftercare planning with referrals and resources  Therapeutic Interventions: Assess for all discharge needs, 1 to 1 time with Social worker, Explore available resources and support systems, Assess for adequacy in community support network, Educate family and significant other(s) on suicide prevention, Complete Psychosocial Assessment, Interpersonal group therapy.  Evaluation of Outcomes: Met  Return home, follow up with current providers   Progress in Treatment: Attending groups: Yes Participating in groups: Yes Taking medication as prescribed: Yes Toleration medication: Yes, no side effects reported at this time Family/Significant other contact made: Yes Patient understands diagnosis: No  Limited insight Discussing patient identified problems/goals with staff: Yes Medical problems stabilized or resolved: Yes Denies suicidal/homicidal ideation: Yes Issues/concerns per patient self-inventory: None Other: N/A  New problem(s) identified: None identified at this time.   New Short Term/Long Term  Goal(s): "I don't know why I am here, if ya'll give me my medication I will be fine"  Discharge Plan or Barriers:   Reason for Continuation of Hospitalization:  Mania Medication stabilization   Estimated Length of Stay: 11/9  Attendees: Patient: Christine Cobb 05/03/2017  9:47 AM  Physician: Maris Berger, MD 05/03/2017  9:47 AM  Nursing: Sena Hitch, RN 05/03/2017  9:47 AM  RN Care Manager: Lars Pinks, RN 05/03/2017  9:47 AM  Social Worker: Ripley Fraise 05/03/2017  9:47 AM  Recreational Therapist: Winfield Cunas 05/03/2017  9:47 AM  Other: Norberto Sorenson 05/03/2017  9:47 AM  Other:  05/03/2017  9:47 AM    Scribe for Treatment Team:  Roque Lias LCSW 05/03/2017 9:47 AM

## 2017-05-03 NOTE — [facility] Group Notes (Signed)
LCSW Group Therapy Note  05/03/2017 1:15pm  Type of Therapy/Topic:  Group Therapy:  Feelings about Diagnosis  Participation Level:  Did Not Attend   Description of Group:   This group will allow patients to explore their thoughts and feelings about diagnoses they have received. Patients will be guided to explore their level of understanding and acceptance of these diagnoses. Facilitator will encourage patients to process their thoughts and feelings about the reactions of others to their diagnosis and will guide patients in identifying ways to discuss their diagnosis with significant others in their lives. This group will be process-oriented, with patients participating in exploration of their own experiences, giving and receiving support, and processing challenge from other group members.   Therapeutic Goals: 1. Patient will demonstrate understanding of diagnosis as evidenced by identifying two or more symptoms of the disorder 2. Patient will be able to express two feelings regarding the diagnosis 3. Patient will demonstrate their ability to communicate their needs through discussion and/or role play  Summary of Patient Progress:       Therapeutic Modalities:   Cognitive Behavioral Therapy Brief Therapy Feelings Identification    Ida Rogue, LCSW 05/03/2017 1:50 PM

## 2017-05-03 NOTE — Progress Notes (Signed)
[facility]  05/03/2017 1:11 PM Christine Cobb  MRN:  [id number] Subjective:    Christine Cobb is a 41 y/o F with history of bipoalr disorder who was admitted with worsening symptoms of agitation and mania. She has been agitated and acting out, and she would not return latex gloves which she had taken from the nurses station, so she was placed on unit restriction yesterday. Since that time, pt has mostly been isolating to herself in her own room. Upon interview today, pt is irritable, pressured, and minimally cooperative. When asked how she is doing, pt replies, "I didn't get my xanax's - I need 2mg  bars - and I didn't get my vicodins, and you haven't done nothing for me since I've been here." Discussed with patient that we have addressed her anxiety with other options, including vistaril and we would be avoiding using of opiate medications to address her pain. Pt then replied, "Well then that's all I got for you; we're done, thank you." Discussed with patient that we would need to work together to refine her treatment plan and work towards a safe discharge from the hospital. Pt grew increasingly irritable, tangential, and pressured, stating, "I can't believe you have me here; I don't have an STD, and I can't even vote today." She denied SI/HI/AH/VH, but she refused to participate in the interview any further. Informed pt that we would increase dose of vistaril to address her ongoing anxiety symptoms, but pt continued to refuse to engage in the interview, so interview was concluded.  Principal Problem: Bipolar affective disorder, manic, severe ([facility]) Diagnosis:   Patient Active Problem List   Diagnosis Date Noted  . Bipolar affective disorder, manic, severe ([facility]) [F31.13] 04/30/2017  . Severe bipolar affective disorder with psychosis ([facility]) [F31.89] 04/29/2017  . Schizoaffective disorder, bipolar type ([facility]) [F25.0]   . Cocaine abuse ([facility]) [F14.10] 12/31/2014  . Bipolar disorder,  current episode manic without psychotic features, severe ([facility]) [F31.13]   . Agitation [R45.1]   . Manic behavior ([facility]) [F30.10]   . Hyperthyroidism [E05.90] 09/21/2013  . Marijuana abuse [F12.10] 04/12/2013  . History of CHF (congestive heart failure) [Z86.79] 02/01/2013  . Abscess of abdominal wall [L02.211] 10/18/2012  . HTN (hypertension) [I10] 04/08/2011  . Tachycardia [R00.0] 04/08/2011   Total Time spent with patient: 30 minutes  Past Psychiatric History: see H&P  Past Medical History:  Past Medical History:  Diagnosis Date  . Arm pain   . Bipolar affective disorder, currently manic, mild ([facility])   . CHF (congestive heart failure) ([facility])   . Eye globe prosthesis   . HTN (hypertension)   . Hyperthyroidism   . Sinus tachycardia     Past Surgical History:  Procedure Laterality Date  . DILATION AND CURETTAGE OF UTERUS    . EYE SURGERY     Family History:  Family History  Problem Relation Age of Onset  . Hypertension Other   . Emphysema Other   . Asthma Son   . Diabetes Maternal Uncle   . Diabetes Paternal Grandmother    Family Psychiatric  History: see H&P Social History:  Social History   Substance and Sexual Activity  Alcohol Use Yes  . Alcohol/week: 0.0 oz   Comment: Last drink: 1/2 beer PTA     Social History   Substance and Sexual Activity  Drug Use Yes  . Types: Marijuana   Comment: Pt sts "anything and everything"    Social History   Socioeconomic History  . Marital  status: Married    Spouse name: None  . Number of children: None  . Years of education: None  . Highest education level: None  Social Needs  . Financial resource strain: None  . Food insecurity - worry: None  . Food insecurity - inability: None  . Transportation needs - medical: None  . Transportation needs - non-medical: None  Occupational History  . None  Tobacco Use  . Smoking status: Current Every Day Smoker    Types: Cigarettes    Last attempt to quit: 08/21/2013     Years since quitting: 3.7  . Smokeless tobacco: Never Used  Substance and Sexual Activity  . Alcohol use: Yes    Alcohol/week: 0.0 oz    Comment: Last drink: 1/2 beer PTA  . Drug use: Yes    Types: Marijuana    Comment: Pt sts "anything and everything"  . Sexual activity: Yes    Birth control/protection: Injection    Comment: crack cocaine  Other Topics Concern  . None  Social History Narrative  . None   Additional Social History:    Pain Medications: none Prescriptions: none Over the Counter: none History of alcohol / drug use?: No history of alcohol / drug abuse                    Sleep: Fair  Appetite:  Fair  Current Medications: Current Facility-Administered Medications  Medication Dose Route Frequency Provider Last Rate Last Dose  . acetaminophen (TYLENOL) tablet 650 mg  650 mg Oral Q6H PRN Charm Rings, NP   650 mg at 05/03/17 1020  . alum & mag hydroxide-simeth (MAALOX/MYLANTA) 200-200-20 MG/5ML suspension 30 mL  30 mL Oral Q4H PRN Charm Rings, NP   30 mL at 05/01/17 1049  . amantadine (SYMMETREL) capsule 100 mg  100 mg Oral BID Oneta Rack, NP   100 mg at 05/03/17 0814  . antiseptic oral rinse (BIOTENE) solution 15 mL  15 mL Mouth Rinse PRN Micheal Likens, MD      . calcium carbonate (TUMS - dosed in mg elemental calcium) chewable tablet 200 mg of elemental calcium  1 tablet Oral BID Oneta Rack, NP   200 mg of elemental calcium at 05/03/17 0814  . feeding supplement (ENSURE ENLIVE) (ENSURE ENLIVE) liquid 237 mL  237 mL Oral BID BM Oneta Rack, NP   237 mL at 05/03/17 0814  . hydrOXYzine (ATARAX/VISTARIL) tablet 50 mg  50 mg Oral TID PRN Micheal Likens, MD      . magnesium hydroxide (MILK OF MAGNESIA) suspension 30 mL  30 mL Oral Daily PRN Charm Rings, NP      . mirtazapine (REMERON) tablet 15 mg  15 mg Oral QHS Oneta Rack, NP   15 mg at 05/01/17 2133  . nicotine polacrilex (NICORETTE) gum 2 mg  2 mg Oral PRN  Oneta Rack, NP   2 mg at 05/03/17 0815  . OLANZapine (ZYPREXA) tablet 5 mg  5 mg Oral BID Oneta Rack, NP   5 mg at 05/03/17 0814  . traZODone (DESYREL) tablet 50 mg  50 mg Oral QHS,MR X 1 Oneta Rack, NP   50 mg at 05/01/17 2133  . triamcinolone 0.1 % cream : eucerin cream, 1:1   Topical TID PRN Oneta Rack, NP   1 application at 05/01/17 1257  . triamterene-hydrochlorothiazide (MAXZIDE-25) 37.5-25 MG per tablet 1 tablet  1 tablet Oral Daily Lewis,  Jerene Pitch, NP   1 tablet at 05/03/17 4098    Lab Results: No results found for this or any previous visit (from the past 48 hour(s)).  Blood Alcohol level:  Lab Results  Component Value Date   ETH <10 04/28/2017   ETH <5 10/22/2016    Metabolic Disorder Labs: Lab Results  Component Value Date   HGBA1C 5.4 12/21/2014   MPG 108 12/21/2014   No results found for: PROLACTIN Lab Results  Component Value Date   CHOL 121 12/21/2014   TRIG 66 12/21/2014   HDL 53 12/21/2014   CHOLHDL 2.3 12/21/2014   VLDL 13 12/21/2014   LDLCALC 55 12/21/2014    Physical Findings: AIMS:  , ,  ,  ,    CIWA:    COWS:     Musculoskeletal: Strength & Muscle Tone: within normal limits Gait & Station: normal Patient leans: N/A  Psychiatric Specialty Exam: Physical Exam  Nursing note and vitals reviewed.   Review of Systems  Constitutional: Negative for chills and fever.  Respiratory: Negative for cough.   Cardiovascular: Negative for chest pain.  Gastrointestinal: Negative for heartburn and nausea.  Neurological: Negative for dizziness and headaches.  Psychiatric/Behavioral: Negative for depression, hallucinations, substance abuse and suicidal ideas.    Blood pressure 112/70, pulse 76, temperature 98 F (36.7 C), temperature source Oral, resp. rate 16, height 5\' 4"  (1.626 m), weight 71.2 kg (157 lb), SpO2 100 %.Body mass index is 26.95 kg/m.  General Appearance: Casual  Eye Contact:  Fair  Speech:  Pressured  Volume:   Increased  Mood:  Anxious, Dysphoric and Irritable  Affect:  Blunt and Labile  Thought Process:  Disorganized and Descriptions of Associations: Tangential  Orientation:  Full (Time, Place, and Person)  Thought Content:  Illogical, Delusions and Paranoid Ideation  Suicidal Thoughts:  No  Homicidal Thoughts:  No  Memory:  Immediate;   Good Recent;   Good Remote;   Good  Judgement:  Impaired  Insight:  Lacking  Psychomotor Activity:  Normal  Concentration:  Concentration: Poor  Recall:  [employer] of Knowledge:  Fair  Language:  Fair  Akathisia:  No  Handed:    AIMS (if indicated):     Assets:  Architect Leisure Time Physical Health Resilience Social Support  ADL's:  Intact  Cognition:  WNL  Sleep:  Number of Hours: 6     Treatment Plan Summary: Daily contact with patient to assess and evaluate symptoms and progress in treatment and Medication management. Pt continues to display signs/sypmtoms of mania including irritability, pressured speech, disorganized behaviors, and agitation. She is perseverative on her missing possessions and refuses to engage in treatment planning. - Continue inpatient hospitalization - Bipolar I, current episode manic             - Continue zyprexa 5mg  BID             - Continue ativan 1mg  PRN q6h prn agitation             - Continue Remeron 15mg  qhs     - Change atarax 25mg  q6h prn to atarax 50mg  q6h prn anxiety - Increase collateral information - Discharge planning will be ongoing   Micheal Likens, MD 05/03/2017, 1:11 PM

## 2017-05-03 NOTE — Progress Notes (Signed)
Recreation Therapy Notes  Date: 05/03/17 Time: 0940 Location: [address] Dayroom  Group Topic: Self-Esteem  Goal Area(s) Addresses:  Patient will successfully identify positive attributes about themselves.  Patient will successfully identify benefit of improved self-esteem.   Intervention: Worksheet  Activity: Self-Exploration.  Patients were given worksheets with eleven sentences in which they had to fill in the blank to complete.  Patients were to identify things such as who they admire, how they feel, what they are struggling with, what makes them proud of themselves, etc.  Education:  Self-Esteem, Discharge Planning.   Education Outcome: Acknowledges education/In group clarification offered/Needs additional education  Clinical Observations/Feedback: Pt did not attend group.   Caroll Rancher, LRT/CTRS         Caroll Rancher A 05/03/2017 11:47 AM

## 2017-05-03 NOTE — Progress Notes (Signed)
D   Pt is depressed and sad   She appears to respond to internal stimuli and tends to isolate to her room   Pt is irritable but requested some ensure and went back to her room  She did not want to take her sleep medications    She appears lethargic at this time A   Verbal support given   Medications administered and effectiveness monitored   Q 15 min checks R   Pt is safe at present

## 2017-05-03 NOTE — Progress Notes (Signed)
Pt did not attend wrap-up group, remained in room sleep.

## 2017-05-04 MED ORDER — TRAZODONE HCL 100 MG PO TABS
100.0000 mg | ORAL_TABLET | Freq: Every evening | ORAL | Status: DC | PRN
  Administered 2017-05-06 – 2017-05-08 (×3): 100 mg via ORAL
  Filled 2017-05-04 (×15): qty 1

## 2017-05-04 MED ORDER — ARIPIPRAZOLE 10 MG PO TABS
10.0000 mg | ORAL_TABLET | Freq: Every day | ORAL | Status: DC
  Administered 2017-05-04 – 2017-05-09 (×6): 10 mg via ORAL
  Filled 2017-05-04: qty 1
  Filled 2017-05-04: qty 7
  Filled 2017-05-04 (×8): qty 1

## 2017-05-04 MED ORDER — IBUPROFEN 600 MG PO TABS
600.0000 mg | ORAL_TABLET | Freq: Four times a day (QID) | ORAL | Status: DC | PRN
  Administered 2017-05-04 – 2017-05-09 (×2): 600 mg via ORAL
  Filled 2017-05-04 (×2): qty 1

## 2017-05-04 NOTE — Progress Notes (Signed)
Pt did not attend wrap-up group   

## 2017-05-04 NOTE — [facility] Group Notes (Signed)

## 2017-05-04 NOTE — Progress Notes (Signed)
Recreation Therapy Notes Date: 05/04/17 Time: 1000 Location: [address] Dayroom  Group Topic: Leisure Education, Goal Setting  Goal Area(s) Addresses:  Patient will be able to identify at least 3 goals for leisure participation.  Patient will be able to identify benefit of investing in leisure participation.  Patient will be able to identify benefit of setting leisure goals.   Intervention: Worksheet  Activity: Life Goals.  Patients were given a worksheet that was broken down into 6 categories (family, friends, work/school, body, mental health and spirituality).  Patients were to identify what they were doing well, what they needed to improve and write a goal to make that improvement.  Education:  Discharge Planning, Pharmacologist, Leisure Education   Education Outcome: Acknowledges Education/In Group Clarification Provided/Needs Additional Education  Clinical Observations:  Pt did not attend group.   Caroll Rancher, LRT/CTRS          Caroll Rancher A 05/04/2017 12:43 PM

## 2017-05-04 NOTE — Progress Notes (Signed)
Patient ID: Christine Cobb, female   DOB: 24-Jul-1975, 41 y.o.   MRN: [id number] PER STATE REGULATIONS 482.30  THIS CHART WAS REVIEWED FOR MEDICAL NECESSITY WITH RESPECT TO THE PATIENT'S ADMISSION/ DURATION OF STAY.  NEXT REVIEW DATE: 05/08/2017 Willa Rough, RN, BSN CASE MANAGER

## 2017-05-04 NOTE — Progress Notes (Signed)
[facility] MD Progress Note  05/04/2017 3:27 PM Christine Cobb  MRN:  [id number] Subjective:   Christine Cobb is a 41 y/o F with history of bipolar disorder who presented with worsening symptoms of agitation and mania. Pt has been demonstrating improvement since being on the unit, and she has been more calm overall; however, she still has some poor insight regarding her behaviors which resulted in being placed on unit restrictions. Pt had taken gloves from the nursing station and she will not return them. Today upon evaluation, she has improvement of her pressured speech and irritability, but she remains perseverative on her missing "14 karat gold diamond ring." She denies SI/HI/AH/VH. Pt reports she is sleeping ok, and she requests for her trazodone dose to be increased. Her appetite is adequate. Discussed with patient about her medications and she feels that zyprexa makes her too drowsy. We discussed option of changing zyprexa for abilify and pt was in agreement. She had no other questions, comments, or concerns.  Principal Problem: Bipolar affective disorder, manic, severe (HCC) Diagnosis:   Patient Active Problem List   Diagnosis Date Noted  . Bipolar affective disorder, manic, severe (HCC) [F31.13] 04/30/2017  . Severe bipolar affective disorder with psychosis (HCC) [F31.89] 04/29/2017  . Schizoaffective disorder, bipolar type (HCC) [F25.0]   . Cocaine abuse (HCC) [F14.10] 12/31/2014  . Bipolar disorder, current episode manic without psychotic features, severe (HCC) [F31.13]   . Agitation [R45.1]   . Manic behavior (HCC) [F30.10]   . Hyperthyroidism [E05.90] 09/21/2013  . Marijuana abuse [F12.10] 04/12/2013  . History of CHF (congestive heart failure) [Z86.79] 02/01/2013  . Abscess of abdominal wall [L02.211] 10/18/2012  . HTN (hypertension) [I10] 04/08/2011  . Tachycardia [R00.0] 04/08/2011   Total Time spent with patient: 30 minutes  Past Psychiatric History: see H&P  Past  Medical History:  Past Medical History:  Diagnosis Date  . Arm pain   . Bipolar affective disorder, currently manic, mild (HCC)   . CHF (congestive heart failure) (HCC)   . Eye globe prosthesis   . HTN (hypertension)   . Hyperthyroidism   . Sinus tachycardia     Past Surgical History:  Procedure Laterality Date  . DILATION AND CURETTAGE OF UTERUS    . EYE SURGERY     Family History:  Family History  Problem Relation Age of Onset  . Hypertension Other   . Emphysema Other   . Asthma Son   . Diabetes Maternal Uncle   . Diabetes Paternal Grandmother    Family Psychiatric  History: see H&P Social History:  Social History   Substance and Sexual Activity  Alcohol Use Yes  . Alcohol/week: 0.0 oz   Comment: Last drink: 1/2 beer PTA     Social History   Substance and Sexual Activity  Drug Use Yes  . Types: Marijuana   Comment: Pt sts "anything and everything"    Social History   Socioeconomic History  . Marital status: Married    Spouse name: None  . Number of children: None  . Years of education: None  . Highest education level: None  Social Needs  . Financial resource strain: None  . Food insecurity - worry: None  . Food insecurity - inability: None  . Transportation needs - medical: None  . Transportation needs - non-medical: None  Occupational History  . None  Tobacco Use  . Smoking status: Current Every Day Smoker    Types: Cigarettes    Last attempt to quit: 08/21/2013  Years since quitting: 3.7  . Smokeless tobacco: Never Used  Substance and Sexual Activity  . Alcohol use: Yes    Alcohol/week: 0.0 oz    Comment: Last drink: 1/2 beer PTA  . Drug use: Yes    Types: Marijuana    Comment: Pt sts "anything and everything"  . Sexual activity: Yes    Birth control/protection: Injection    Comment: crack cocaine  Other Topics Concern  . None  Social History Narrative  . None   Additional Social History:    Pain Medications: none Prescriptions:  none Over the Counter: none History of alcohol / drug use?: No history of alcohol / drug abuse                    Sleep: Fair  Appetite:  Fair  Current Medications: Current Facility-Administered Medications  Medication Dose Route Frequency Provider Last Rate Last Dose  . alum & mag hydroxide-simeth (MAALOX/MYLANTA) 200-200-20 MG/5ML suspension 30 mL  30 mL Oral Q4H PRN Charm Rings, NP   30 mL at 05/01/17 1049  . amantadine (SYMMETREL) capsule 100 mg  100 mg Oral BID Oneta Rack, NP   100 mg at 05/04/17 0917  . antiseptic oral rinse (BIOTENE) solution 15 mL  15 mL Mouth Rinse PRN Micheal Likens, MD      . ARIPiprazole (ABILIFY) tablet 10 mg  10 mg Oral Daily Micheal Likens, MD      . calcium carbonate (TUMS - dosed in mg elemental calcium) chewable tablet 200 mg of elemental calcium  1 tablet Oral BID Oneta Rack, NP   200 mg of elemental calcium at 05/04/17 0917  . feeding supplement (ENSURE ENLIVE) (ENSURE ENLIVE) liquid 237 mL  237 mL Oral BID BM Oneta Rack, NP   237 mL at 05/04/17 0919  . hydrOXYzine (ATARAX/VISTARIL) tablet 50 mg  50 mg Oral TID PRN Micheal Likens, MD   50 mg at 05/04/17 0426  . ibuprofen (ADVIL,MOTRIN) tablet 600 mg  600 mg Oral Q6H PRN Micheal Likens, MD      . magnesium hydroxide (MILK OF MAGNESIA) suspension 30 mL  30 mL Oral Daily PRN Charm Rings, NP      . mirtazapine (REMERON) tablet 15 mg  15 mg Oral QHS Oneta Rack, NP   15 mg at 05/03/17 2334  . nicotine polacrilex (NICORETTE) gum 2 mg  2 mg Oral PRN Oneta Rack, NP   2 mg at 05/04/17 0917  . traZODone (DESYREL) tablet 100 mg  100 mg Oral QHS,MR X 1 Saadiya Wilfong T, MD      . triamcinolone 0.1 % cream : eucerin cream, 1:1   Topical TID PRN Oneta Rack, NP   1 application at 05/01/17 1257  . triamterene-hydrochlorothiazide (MAXZIDE-25) 37.5-25 MG per tablet 1 tablet  1 tablet Oral Daily Oneta Rack, NP   1 tablet at  05/04/17 4098    Lab Results: No results found for this or any previous visit (from the past 48 hour(s)).  Blood Alcohol level:  Lab Results  Component Value Date   [facility] <10 04/28/2017   ETH <5 10/22/2016    Metabolic Disorder Labs: Lab Results  Component Value Date   HGBA1C 5.4 12/21/2014   MPG 108 12/21/2014   No results found for: PROLACTIN Lab Results  Component Value Date   CHOL 121 12/21/2014   TRIG 66 12/21/2014   HDL 53 12/21/2014  CHOLHDL 2.3 12/21/2014   VLDL 13 12/21/2014   LDLCALC 55 12/21/2014    Physical Findings: AIMS:  , ,  ,  ,    CIWA:    COWS:     Musculoskeletal: Strength & Muscle Tone: within normal limits Gait & Station: normal Patient leans: N/A  Psychiatric Specialty Exam: Physical Exam  Nursing note and vitals reviewed.   Review of Systems  Constitutional: Negative for chills and fever.  Cardiovascular: Negative for chest pain.  Gastrointestinal: Negative for heartburn, nausea and vomiting.  Psychiatric/Behavioral: Negative for depression, hallucinations and suicidal ideas. The patient is nervous/anxious.     Blood pressure 129/71, pulse 63, temperature 98.3 F (36.8 C), temperature source Oral, resp. rate 16, height 5\' 4"  (1.626 m), weight 71.2 kg (157 lb), SpO2 100 %.Body mass index is 26.95 kg/m.  General Appearance: Casual  Eye Contact:  Good  Speech:  Clear and Coherent and Normal Rate  Volume:  Normal  Mood:  Anxious and Irritable  Affect:  Congruent and Constricted  Thought Process:  Goal Directed  Orientation:  Full (Time, Place, and Person)  Thought Content:  Paranoid Ideation and Rumination  Suicidal Thoughts:  No  Homicidal Thoughts:  No  Memory:  Immediate;   Fair Recent;   Fair Remote;   Fair  Judgement:  Fair  Insight:  Fair  Psychomotor Activity:  Normal  Concentration:  Concentration: Fair  Recall:  FiservFair  Fund of Knowledge:  Fair  Language:  Good  Akathisia:  No  Handed:    AIMS (if indicated):      Assets:  Communication Skills Desire for Improvement Physical Health Resilience Social Support  ADL's:  Intact  Cognition:  WNL  Sleep:  Number of Hours: 3     Treatment Plan Summary: Daily contact with patient to assess and evaluate symptoms and progress in treatment and Medication management. Pt continues to have signs/symptoms of mania and agitation, but she is showing daily improvement. She would like to change from zyprexa to abilify today to reduce daytime drowsiness. - Continue inpatient hospitalization - Bipolar I, current episode manic - Discontinue zyprexa 5mg  BID     - Start abilify 10mg  qDay - Continue ativan 1mg  PRN q6h prn agitation - Continue Remeron 15mg  qhs     - Continue atarax 50mg  q6h prn anxiety    - Increase trazodone 50mg  qhs prn insomnia to trazodone 100mg  qhs prn insomnia - Increase collateral information - Discharge planning will be ongoing    Micheal Likenshristopher T Landon Truax, MD 05/04/2017, 3:27 PM

## 2017-05-05 NOTE — Plan of Care (Signed)
Pt slept 3 hrs last night.

## 2017-05-05 NOTE — Progress Notes (Signed)
D: Patient denies SI, HI or AVH. Patient presents as flat and depressed but is otherwise pleasant and cooperative.  Pt. States she slept well and due to unit restriction was brought breakfast from the cafeteria. She denies any physical complaints and is visualized in the dayroom interacting with others. She is beginning to develop her discharge plan and denies any other concerns.   A: Patient given emotional support from RN. Patient encouraged to come to staff with concerns and/or questions. Patient's medication routine continued. Patient's orders and plan of care reviewed.   R: Patient remains appropriate and cooperative. Will continue to monitor patient q15 minutes for safety.

## 2017-05-05 NOTE — Progress Notes (Signed)
Recreation Therapy Notes  Date: 05/05/17 Time: 1015 Location: 500 [address] Dayroom  Group Topic: Communication  Goal Area(s) Addresses:  Patient will effectively communicate with peers in group.  Patient will verbalize benefit of healthy communication. Patient will verbalize positive effect of healthy communication on post d/c goals.  Patient will identify communication techniques that made activity effective for group.   Intervention:  Geometrical drawings, pencils, blank paper  Activity: Back to Back Drawings.  Patients were divided into groups of 2.  One person gave the instructions and the other drew the picture according to the instructions given.  The person drawing was not allowed to ask any questions of the person giving the directions.  Patients switched roles in the second round.   Education: Communication, Discharge Planning  Education Outcome: Acknowledges understanding/In group clarification offered/Needs additional education.   Clinical Observations/Feedback: Pt did not attend group.    Caroll Rancher, LRT/CTRS         Lillia Abed, Sherrina Zaugg A 05/05/2017 11:59 AM

## 2017-05-05 NOTE — Progress Notes (Signed)
[facility] MD Progress Note  05/05/2017 12:59 PM Christine Cobb  MRN:  [id number] Subjective:   Christine Cobb is a 41 y/o F with history of bipolar I who presented with worsening mood, agitation, and symptoms of mania. Pt was restarted on home medications and observed on the inpatient unit. She had improvement of her symptoms and home medication of zyprexa was changed to abilify. Today upon evaluation, pt reports that she is doing well overall. She notes that her sleep was slightly disturbed by feeling "hot flashes" which she associates with menopause, but otherwise, she is sleeping well. Her appetite is good. She denies SI/HI/AH/VH. She is tolerating her current medication regimen without problems. She has been attending groups and participating in the therapeutic milieu. Discussed with patient about working towards a discharge plan and she was hoping to discharge to stay with her significant other, but she would have to check to see if that is an available option first. Pt plans to call and see if she is able to discharge to her significant other, and she if she cannot she would be open to referral to shelters. Pt had no further questions, comments, or concerns.  Principal Problem: Bipolar affective disorder, manic, severe (HCC) Diagnosis:   Patient Active Problem List   Diagnosis Date Noted  . Bipolar affective disorder, manic, severe (HCC) [F31.13] 04/30/2017  . Severe bipolar affective disorder with psychosis (HCC) [F31.89] 04/29/2017  . Schizoaffective disorder, bipolar type (HCC) [F25.0]   . Cocaine abuse (HCC) [F14.10] 12/31/2014  . Bipolar disorder, current episode manic without psychotic features, severe (HCC) [F31.13]   . Agitation [R45.1]   . Manic behavior (HCC) [F30.10]   . Hyperthyroidism [E05.90] 09/21/2013  . Marijuana abuse [F12.10] 04/12/2013  . History of CHF (congestive heart failure) [Z86.79] 02/01/2013  . Abscess of abdominal wall [L02.211] 10/18/2012  . HTN  (hypertension) [I10] 04/08/2011  . Tachycardia [R00.0] 04/08/2011   Total Time spent with patient: 30 minutes  Past Psychiatric History: see H&P  Past Medical History:  Past Medical History:  Diagnosis Date  . Arm pain   . Bipolar affective disorder, currently manic, mild (HCC)   . CHF (congestive heart failure) (HCC)   . Eye globe prosthesis   . HTN (hypertension)   . Hyperthyroidism   . Sinus tachycardia     Past Surgical History:  Procedure Laterality Date  . DILATION AND CURETTAGE OF UTERUS    . EYE SURGERY     Family History:  Family History  Problem Relation Age of Onset  . Hypertension Other   . Emphysema Other   . Asthma Son   . Diabetes Maternal Uncle   . Diabetes Paternal Grandmother    Family Psychiatric  History: see H&P Social History:  Social History   Substance and Sexual Activity  Alcohol Use Yes  . Alcohol/week: 0.0 oz   Comment: Last drink: 1/2 beer PTA     Social History   Substance and Sexual Activity  Drug Use Yes  . Types: Marijuana   Comment: Pt sts "anything and everything"    Social History   Socioeconomic History  . Marital status: Married    Spouse name: None  . Number of children: None  . Years of education: None  . Highest education level: None  Social Needs  . Financial resource strain: None  . Food insecurity - worry: None  . Food insecurity - inability: None  . Transportation needs - medical: None  . Transportation needs - non-medical: None  Occupational History  . None  Tobacco Use  . Smoking status: Current Every Day Smoker    Types: Cigarettes    Last attempt to quit: 08/21/2013    Years since quitting: 3.7  . Smokeless tobacco: Never Used  Substance and Sexual Activity  . Alcohol use: Yes    Alcohol/week: 0.0 oz    Comment: Last drink: 1/2 beer PTA  . Drug use: Yes    Types: Marijuana    Comment: Pt sts "anything and everything"  . Sexual activity: Yes    Birth control/protection: Injection    Comment:  crack cocaine  Other Topics Concern  . None  Social History Narrative  . None   Additional Social History:    Pain Medications: none Prescriptions: none Over the Counter: none History of alcohol / drug use?: No history of alcohol / drug abuse                    Sleep: Good  Appetite:  Good  Current Medications: Current Facility-Administered Medications  Medication Dose Route Frequency Provider Last Rate Last Dose  . alum & mag hydroxide-simeth (MAALOX/MYLANTA) 200-200-20 MG/5ML suspension 30 mL  30 mL Oral Q4H PRN Charm Rings, NP   30 mL at 05/04/17 1557  . amantadine (SYMMETREL) capsule 100 mg  100 mg Oral BID Oneta Rack, NP   100 mg at 05/05/17 0849  . antiseptic oral rinse (BIOTENE) solution 15 mL  15 mL Mouth Rinse PRN Micheal Likens, MD   15 mL at 05/05/17 0853  . ARIPiprazole (ABILIFY) tablet 10 mg  10 mg Oral Daily Micheal Likens, MD   10 mg at 05/05/17 0849  . calcium carbonate (TUMS - dosed in mg elemental calcium) chewable tablet 200 mg of elemental calcium  1 tablet Oral BID Oneta Rack, NP   200 mg of elemental calcium at 05/05/17 0849  . feeding supplement (ENSURE ENLIVE) (ENSURE ENLIVE) liquid 237 mL  237 mL Oral BID BM Oneta Rack, NP   237 mL at 05/04/17 1557  . hydrOXYzine (ATARAX/VISTARIL) tablet 50 mg  50 mg Oral TID PRN Micheal Likens, MD   50 mg at 05/05/17 0854  . ibuprofen (ADVIL,MOTRIN) tablet 600 mg  600 mg Oral Q6H PRN Micheal Likens, MD   600 mg at 05/04/17 1805  . magnesium hydroxide (MILK OF MAGNESIA) suspension 30 mL  30 mL Oral Daily PRN Charm Rings, NP      . mirtazapine (REMERON) tablet 15 mg  15 mg Oral QHS Oneta Rack, NP   15 mg at 05/03/17 2334  . nicotine polacrilex (NICORETTE) gum 2 mg  2 mg Oral PRN Oneta Rack, NP   2 mg at 05/05/17 0853  . traZODone (DESYREL) tablet 100 mg  100 mg Oral QHS,MR X 1 Khole Arterburn T, MD      . triamcinolone 0.1 % cream : eucerin  cream, 1:1   Topical TID PRN Oneta Rack, NP   1 application at 05/01/17 1257  . triamterene-hydrochlorothiazide (MAXZIDE-25) 37.5-25 MG per tablet 1 tablet  1 tablet Oral Daily Oneta Rack, NP   1 tablet at 05/05/17 1914    Lab Results: No results found for this or any previous visit (from the past 48 hour(s)).  Blood Alcohol level:  Lab Results  Component Value Date   [facility] <10 04/28/2017   ETH <5 10/22/2016    Metabolic Disorder Labs: Lab Results  Component Value  Date   HGBA1C 5.4 12/21/2014   MPG 108 12/21/2014   No results found for: PROLACTIN Lab Results  Component Value Date   CHOL 121 12/21/2014   TRIG 66 12/21/2014   HDL 53 12/21/2014   CHOLHDL 2.3 12/21/2014   VLDL 13 12/21/2014   LDLCALC 55 12/21/2014    Physical Findings: AIMS:  , ,  ,  ,    CIWA:    COWS:     Musculoskeletal: Strength & Muscle Tone: within normal limits Gait & Station: normal Patient leans: N/A  Psychiatric Specialty Exam: Physical Exam  Nursing note and vitals reviewed.   Review of Systems  Constitutional: Negative for chills and fever.  Cardiovascular: Negative for chest pain.  Gastrointestinal: Negative for heartburn and nausea.  Psychiatric/Behavioral: Negative for depression, hallucinations and suicidal ideas.    Blood pressure 107/69, pulse 61, temperature 98.1 F (36.7 C), resp. rate 20, height 5\' 4"  (1.626 m), weight 71.2 kg (157 lb), SpO2 100 %.Body mass index is 26.95 kg/m.  General Appearance: Casual  Eye Contact:  Good  Speech:  Clear and Coherent and Normal Rate  Volume:  Normal  Mood:  Euthymic  Affect:  Congruent  Thought Process:  Coherent and Goal Directed  Orientation:  Full (Time, Place, and Person)  Thought Content:  Logical  Suicidal Thoughts:  No  Homicidal Thoughts:  No  Memory:  Immediate;   Good Recent;   Good Remote;   Good  Judgement:  Fair  Insight:  Fair  Psychomotor Activity:  Normal  Concentration:  Concentration: Good  Recall:   Good  Fund of Knowledge:  Good  Language:  Good  Akathisia:  No  Handed:    AIMS (if indicated):     Assets:  ArchitectCommunication Skills Financial Resources/Insurance Physical Health Resilience Social Support  ADL's:  Intact  Cognition:  WNL  Sleep:  Number of Hours: 3     Treatment Plan Summary: Daily contact with patient to assess and evaluate symptoms and progress in treatment and Medication management. Pt has stability of mood symptoms and she is tolerating her medications without difficulty. She is future oriented about discharging to stay with her significant other and she is planning to make arrangements so that she would be able to discharge tomorrow. - Continue inpatient hospitalization - Bipolar I, current episode manic  - Continue abilify 10mg  qDay - Continue ativan 1mg  PRN q6h prn agitation - Continue Remeron 15mg  qhs             - Continue atarax 50mg  q6h prn anxiety             - Continue trazodone 100mg  qhs prn insomnia - Increase collateral information - Discharge planning will be ongoing  Micheal Likenshristopher T Jahn Franchini, MD 05/05/2017, 12:59 PM

## 2017-05-05 NOTE — Progress Notes (Addendum)
  DATA ACTION RESPONSE  Objective- Pt. is visible in the room, seen resting in bed with eyes open. Presents with a flat/depressed affect and mood.No further c/o. Mild hand tremors noted.  Subjective- Denies having any SI/HI/AVH/Pain at this time.Is cooperative and remain safe on the unit.  1:1 interaction in private to establish rapport. Encouragement, education, & support given from staff. PRN vistaril requested and will re-eval.    Safety maintained with Q 15 checks. Continue with POC.

## 2017-05-05 NOTE — [facility] Group Notes (Addendum)
LCSW Group Therapy 05/05/2017 1:15pm  Type of Therapy and Topic:  Group Therapy:  Change and Accountability  Participation Level:  Minimal  Description of Group In this group, patients discussed power and accountability for change.  The group identified the challenges related to accountability and the difficulty of accepting the outcomes of negative behaviors.  Patients were encouraged to openly discuss a challenge/change they could take responsibility for.  Patients discussed the use of "change talk" and positive thinking as ways to support achievement of personal goals.  The group discussed ways to give support and empowerment to peers.  Therapeutic Goals: 1. Patients will state the relationship between personal power and accountability in the change process 2. Patients will identify the positive and negative consequences of a personal choice they have made 3. Patients will identify one challenge/choice they will take responsibility for making 4. Patients will discuss the role of "change talk" and the impact of positive thinking as it supports successful personal change 5. Patients will verbalize support and affirmation of change efforts in peers  Summary of Patient Progress:  Krystel attended group, but came to group late.  She appeared interested in the story while she was there. When asked about her recovery she began talking very quickly and her thoughts were slightly disorganized. She did not share anything valuable about her recovery story.    Therapeutic Modalities Solution Focused Brief Therapy Motivational Interviewing Cognitive Behavioral Therapy  Carlynn Herald Work 05/05/2017 12:57 PM

## 2017-05-05 NOTE — Progress Notes (Signed)
Patient ID: Christine Cobb, female   DOB: Nov 05, 1975, 41 y.o.   MRN: [id number]  Pt sleeping since beginning of shift. Respiration even and unlabored. Pt does not appear to be in any distress. Will continue to monitor pt through the night.

## 2017-05-05 NOTE — Progress Notes (Signed)
Patient ID: Christine Cobb, female   DOB: [birth year], 41 y.o.   MRN: [id number] Pt up this morning calm and cooperative. Pt reported she started Abilify yesterday which made her very sleepy. Pt stated "I really needed the sleep". Pt on UR gave list of preference for breakfast. Pt is safe will continue to monitor.

## 2017-05-06 NOTE — [facility] Suicide Risk Assessment (Signed)
[facility] INPATIENT:  Family/Significant Other Suicide Prevention Education  Suicide Prevention Education:  Patient Refusal for Family/Significant Other Suicide Prevention Education: The patient Christine Cobb has refused to provide written consent for family/significant other to be provided Family/Significant Other Suicide Prevention Education during admission and/or prior to discharge.  Physician notified.  Ida Rogue 05/06/2017, 3:14 PM

## 2017-05-06 NOTE — Tx Team (Signed)
Interdisciplinary Treatment and Diagnostic Plan Update  05/06/2017 Time of Session: 12:41 PM  Christine Cobb MRN: [id number]  Principal Diagnosis: Bipolar affective disorder, manic, severe ([place])  Secondary Diagnoses: Principal Problem:   Bipolar affective disorder, manic, severe ([place])   Current Medications:  Current Facility-Administered Medications  Medication Dose Route Frequency Provider Last Rate Last Dose  . alum & mag hydroxide-simeth (MAALOX/MYLANTA) 200-200-20 MG/5ML suspension 30 mL  30 mL Oral Q4H PRN Patrecia Pour, NP   30 mL at 05/04/17 1557  . amantadine (SYMMETREL) capsule 100 mg  100 mg Oral BID Derrill Center, NP   100 mg at 05/06/17 0823  . antiseptic oral rinse (BIOTENE) solution 15 mL  15 mL Mouth Rinse PRN Pennelope Bracken, MD   15 mL at 05/06/17 1937  . ARIPiprazole (ABILIFY) tablet 10 mg  10 mg Oral Daily Pennelope Bracken, MD   10 mg at 05/06/17 0823  . calcium carbonate (TUMS - dosed in mg elemental calcium) chewable tablet 200 mg of elemental calcium  1 tablet Oral BID Derrill Center, NP   200 mg of elemental calcium at 05/06/17 0823  . feeding supplement (ENSURE ENLIVE) (ENSURE ENLIVE) liquid 237 mL  237 mL Oral BID BM Derrill Center, NP   237 mL at 05/06/17 0826  . hydrOXYzine (ATARAX/VISTARIL) tablet 50 mg  50 mg Oral TID PRN Pennelope Bracken, MD   50 mg at 05/06/17 0146  . ibuprofen (ADVIL,MOTRIN) tablet 600 mg  600 mg Oral Q6H PRN Pennelope Bracken, MD   600 mg at 05/04/17 1805  . magnesium hydroxide (MILK OF MAGNESIA) suspension 30 mL  30 mL Oral Daily PRN Patrecia Pour, NP      . mirtazapine (REMERON) tablet 15 mg  15 mg Oral QHS Derrill Center, NP   15 mg at 05/06/17 0146  . nicotine polacrilex (NICORETTE) gum 2 mg  2 mg Oral PRN Derrill Center, NP   2 mg at 05/05/17 0853  . traZODone (DESYREL) tablet 100 mg  100 mg Oral QHS,MR X 1 Pennelope Bracken, MD   100 mg at 05/06/17 0147  . triamcinolone 0.1 %  cream : eucerin cream, 1:1   Topical TID PRN Derrill Center, NP   1 application at 90/24/09 1257  . triamterene-hydrochlorothiazide (MAXZIDE-25) 37.5-25 MG per tablet 1 tablet  1 tablet Oral Daily Derrill Center, NP   1 tablet at 05/06/17 7353    PTA Medications: Medications Prior to Admission  Medication Sig Dispense Refill Last Dose  . amantadine (SYMMETREL) 100 MG capsule Take 1 capsule (100 mg total) by mouth 2 (two) times daily. (Patient not taking: Reported on 04/26/2017) 60 capsule 0 Unknown at Unknown time  . hydrOXYzine (VISTARIL) 25 MG capsule Take 1 capsule (25 mg total) by mouth 3 (three) times daily as needed for anxiety. (Patient not taking: Reported on 04/28/2017) 30 capsule 0 Unknown at Unknown time  . mirtazapine (REMERON) 15 MG tablet Take 1 tablet (15 mg total) by mouth at bedtime. (Patient not taking: Reported on 04/26/2017) 30 tablet 0 Unknown at Unknown time  . temazepam (RESTORIL) 7.5 MG capsule Take 1 capsule (7.5 mg total) by mouth at bedtime as needed for sleep. (Patient not taking: Reported on 04/26/2017) 30 capsule 0 Unknown at Unknown time  . traZODone (DESYREL) 100 MG tablet Take 200 mg by mouth at bedtime.   Unknown at Unknown time  . ziprasidone (GEODON) 60 MG capsule Take 1 capsule (  60 mg total) by mouth 2 (two) times daily with a meal. (Patient not taking: Reported on 04/26/2017) 60 capsule 0 Unknown at Unknown time    Patient Stressors: Health problems Medication change or noncompliance  Patient Strengths: Curator fund of knowledge  Treatment Modalities: Medication Management, Group therapy, Case management,  1 to 1 session with clinician, Psychoeducation, Recreational therapy.   Physician Treatment Plan for Primary Diagnosis: Bipolar affective disorder, manic, severe ([place]) Long Term Goal(s): Improvement in symptoms so as ready for discharge  Short Term Goals: Ability to identify changes in lifestyle to reduce recurrence of  condition will improve Ability to verbalize feelings will improve Ability to demonstrate self-control will improve Ability to identify and develop effective coping behaviors will improve Ability to maintain clinical measurements within normal limits will improve Compliance with prescribed medications will improve  Medication Management: Evaluate patient's response, side effects, and tolerance of medication regimen.  Therapeutic Interventions: 1 to 1 sessions, Unit Group sessions and Medication administration.  Evaluation of Outcomes: Adequate for Discharge  Physician Treatment Plan for Secondary Diagnosis: Principal Problem:   Bipolar affective disorder, manic, severe ([place])   Long Term Goal(s): Improvement in symptoms so as ready for discharge  Short Term Goals: Ability to identify changes in lifestyle to reduce recurrence of condition will improve Ability to verbalize feelings will improve Ability to demonstrate self-control will improve Ability to identify and develop effective coping behaviors will improve Ability to maintain clinical measurements within normal limits will improve Compliance with prescribed medications will improve  Medication Management: Evaluate patient's response, side effects, and tolerance of medication regimen.  Therapeutic Interventions: 1 to 1 sessions, Unit Group sessions and Medication administration.  Evaluation of Outcomes: Adequate for Discharge   RN Treatment Plan for Primary Diagnosis: Bipolar affective disorder, manic, severe ([place]) Long Term Goal(s): Knowledge of disease and therapeutic regimen to maintain health will improve  Short Term Goals: Ability to identify and develop effective coping behaviors will improve and Compliance with prescribed medications will improve  Medication Management: RN will administer medications as ordered by provider, will assess and evaluate patient's response and provide education to patient for prescribed  medication. RN will report any adverse and/or side effects to prescribing provider.  Therapeutic Interventions: 1 on 1 counseling sessions, Psychoeducation, Medication administration, Evaluate responses to treatment, Monitor vital signs and CBGs as ordered, Perform/monitor CIWA, COWS, AIMS and Fall Risk screenings as ordered, Perform wound care treatments as ordered.  Evaluation of Outcomes: Adequate for Discharge   LCSW Treatment Plan for Primary Diagnosis: Bipolar affective disorder, manic, severe ([place]) Long Term Goal(s): Safe transition to appropriate next level of care at discharge, Engage patient in therapeutic group addressing interpersonal concerns.  Short Term Goals: Engage patient in aftercare planning with referrals and resources  Therapeutic Interventions: Assess for all discharge needs, 1 to 1 time with Social worker, Explore available resources and support systems, Assess for adequacy in community support network, Educate family and significant other(s) on suicide prevention, Complete Psychosocial Assessment, Interpersonal group therapy.  Evaluation of Outcomes: Met  Return home, follow up with current providers   Progress in Treatment: Attending groups: Yes Participating in groups: Yes Taking medication as prescribed: Yes Toleration medication: Yes, no side effects reported at this time Family/Significant other contact made: Yes Patient understands diagnosis: No  Limited insight Discussing patient identified problems/goals with staff: Yes Medical problems stabilized or resolved: Yes Denies suicidal/homicidal ideation: Yes Issues/concerns per patient self-inventory: None Other: N/A  New problem(s) identified: Pt wants to  return to "baby daddy" but has been unable to get ahold of him.  Plan B is    New Short Term/Long Term Goal(s): "I don't know why I am here, if ya'll give me my medication I will be fine"  Discharge Plan or Barriers:   Reason for Continuation of  Hospitalization:  Mania Medication stabilization   Estimated Length of Stay: 1-3 days  Attendees: Patient:  05/06/2017  12:41 PM  Physician: Maris Berger, MD 05/06/2017  12:41 PM  Nursing: Sena Hitch, RN 05/06/2017  12:41 PM  RN Care Manager: Lars Pinks, RN 05/06/2017  12:41 PM  Social Worker: Ripley Fraise 05/06/2017  12:41 PM  Recreational Therapist: Winfield Cunas 05/06/2017  12:41 PM  Other: Norberto Sorenson 05/06/2017  12:41 PM  Other:  05/06/2017  12:41 PM    Scribe for Treatment Team:  Roque Lias LCSW 05/06/2017 12:41 PM

## 2017-05-06 NOTE — Progress Notes (Signed)
DATA ACTION RESPONSE  Objective- Pt. is visible in the room, seen resting in bed with eyes open. Presents with a flat/depressed affect and mood.No further c/o. Remains isolative to room. Minimal with interaction. Subjective- Denies having any SI/HI/AVH/Pain at this time.Is cooperative and remain safe on the unit.  1:1 interaction in private to establish rapport. Encouragement, education, & support given from staff.   Safety maintained with Q 15 checks. Continue with POC.

## 2017-05-06 NOTE — Progress Notes (Signed)
[facility] MD Progress Note  05/06/2017 2:42 PM Roselynn [name]  MRN:  [id number] Subjective:   Christine Cobb is a 41 y/o F with history of bipolar 1 disorder who was admitted with worsening symptoms of agitation and mania. She was restarted on home medications and transitioned from zyprexa to abilify. She had improvement of her mood symptoms during her admission, and today upon evaluation, she reports she is doing well overall. She is sleeping well and her appetite is adequate. Pt is no longer pressured or perseverative on her items being stolen. She is calm and cooperative. She denies SI/HI/AH/VH.  She endorses some anxiety about the uncertainty of her discharge plan as she has not been able to contact her significant other about staying with him and her ability to pay for an alternative place to stay is limited because her debit card is with her significant other. Pt does not feel that she would be able to maintain her own safety discharging without a specific place to stay, so we agreed to have her remain at [facility] until a safe discharge plan could be arranged. Pt complains of some light-headedness right when she wakes up, but she notes this is a chronic problem and she will continue to monitor it while she is at [facility]. She had no further questions, comments, or concerns.    Principal Problem: Bipolar affective disorder, manic, severe (HCC) Diagnosis:   Patient Active Problem List   Diagnosis Date Noted  . Bipolar affective disorder, manic, severe (HCC) [F31.13] 04/30/2017  . Severe bipolar affective disorder with psychosis (HCC) [F31.89] 04/29/2017  . Schizoaffective disorder, bipolar type (HCC) [F25.0]   . Cocaine abuse ([facility]CC) [F14.10] 12/31/2014  . Bipolar disorder, current episode manic without psychotic features, severe (H[facility]) [F31.13]   . Agitation [R45.1]   . Manic behavior ([facility]) [F30.10]   . Hyperthyroidism [E05.90] 09/21/2013  . Marijuana abuse [F12.10] 04/12/2013  . History of CHF  (congestive heart failure) [Z86.79] 02/01/2013  . Abscess of abdominal wall [L02.211] 10/18/2012  . HTN (hypertension) [I10] 04/08/2011  . Tachycardia [R00.0] 04/08/2011   Total Time spent with patient: 30 minutes  Past Psychiatric History: see h&P  Past Medical History:  Past Medical History:  Diagnosis Date  . Arm pain   . Bipolar affective disorder, currently manic, mild (HCC)   . CHF (congestive heart failure) (HCC)   . Eye globe prosthesis   . HTN (hypertension)   . Hyperthyroidism   . Sinus tachycardia     Past Surgical History:  Procedure Laterality Date  . DILATION AND CURETTAGE OF UTERUS    . EYE SURGERY     Family History:  Family History  Problem Relation Age of Onset  . Hypertension Other   . Emphysema Other   . Asthma Son   . Diabetes Maternal Uncle   . Diabetes Paternal Grandmother    Family Psychiatric  History: see h&P Social History:  Social History   Substance and Sexual Activity  Alcohol Use Yes  . Alcohol/week: 0.0 oz   Comment: Last drink: 1/2 beer PTA     Social History   Substance and Sexual Activity  Drug Use Yes  . Types: Marijuana   Comment: Pt sts "anything and everything"    Social History   Socioeconomic History  . Marital status: Married    Spouse name: None  . Number of children: None  . Years of education: None  . Highest education level: None  Social Needs  . Financial resource strain: None  .  Food insecurity - worry: None  . Food insecurity - inability: None  . Transportation needs - medical: None  . Transportation needs - non-medical: None  Occupational History  . None  Tobacco Use  . Smoking status: Current Every Day Smoker    Types: Cigarettes    Last attempt to quit: 08/21/2013    Years since quitting: 3.7  . Smokeless tobacco: Never Used  Substance and Sexual Activity  . Alcohol use: Yes    Alcohol/week: 0.0 oz    Comment: Last drink: 1/2 beer PTA  . Drug use: Yes    Types: Marijuana    Comment: Pt sts  "anything and everything"  . Sexual activity: Yes    Birth control/protection: Injection    Comment: crack cocaine  Other Topics Concern  . None  Social History Narrative  . None   Additional Social History:    Pain Medications: none Prescriptions: none Over the Counter: none History of alcohol / drug use?: No history of alcohol / drug abuse                    Sleep: Good  Appetite:  Good  Current Medications: Current Facility-Administered Medications  Medication Dose Route Frequency Provider Last Rate Last Dose  . alum & mag hydroxide-simeth (MAALOX/MYLANTA) 200-200-20 MG/5ML suspension 30 mL  30 mL Oral Q4H PRN Charm RingsLord, Jamison Y, NP   30 mL at 05/04/17 1557  . amantadine (SYMMETREL) capsule 100 mg  100 mg Oral BID Oneta RackLewis, Tanika N, NP   100 mg at 05/06/17 0823  . antiseptic oral rinse (BIOTENE) solution 15 mL  15 mL Mouth Rinse PRN Micheal Likensainville, Briget Shaheed T, MD   15 mL at 05/06/17 [id number]  . ARIPiprazole (ABILIFY) tablet 10 mg  10 mg Oral Daily Micheal Likens[place], Fumio Vandam T, MD   10 mg at 05/06/17 0823  . calcium carbonate (TUMS - dosed in mg elemental calcium) chewable tablet 200 mg of elemental calcium  1 tablet Oral BID Oneta RackLewis, Tanika N, NP   200 mg of elemental calcium at 05/06/17 0823  . feeding supplement (ENSURE ENLIVE) (ENSURE ENLIVE) liquid 237 mL  237 mL Oral BID BM Oneta RackLewis, Tanika N, NP   237 mL at 05/06/17 1435  . hydrOXYzine (ATARAX/VISTARIL) tablet 50 mg  50 mg Oral TID PRN Micheal Likensainville, Smokey Melott T, MD   50 mg at 05/06/17 0146  . ibuprofen (ADVIL,MOTRIN) tablet 600 mg  600 mg Oral Q6H PRN Micheal Likens[place], Tremon Sainvil T, MD   600 mg at 05/04/17 1805  . magnesium hydroxide (MILK OF MAGNESIA) suspension 30 mL  30 mL Oral Daily PRN Charm RingsLord, Jamison Y, NP      . mirtazapine (REMERON) tablet 15 mg  15 mg Oral QHS Oneta RackLewis, Tanika N, NP   15 mg at 05/06/17 0146  . nicotine polacrilex (NICORETTE) gum 2 mg  2 mg Oral PRN Oneta RackLewis, Tanika N, NP   2 mg at 05/05/17 0853  . traZODone (DESYREL) tablet  100 mg  100 mg Oral QHS,MR X 1 Micheal Likens[place], Chevez Sambrano T, MD   100 mg at 05/06/17 0147  . triamcinolone 0.1 % cream : eucerin cream, 1:1   Topical TID PRN Oneta RackLewis, Tanika N, NP   1 application at 05/01/17 1257  . triamterene-hydrochlorothiazide (MAXZIDE-25) 37.5-25 MG per tablet 1 tablet  1 tablet Oral Daily Oneta RackLewis, Tanika N, NP   1 tablet at 05/06/17 [id number]    Lab Results: No results found for this or any previous visit (from the past 48 hour(s)).  Blood  Alcohol level:  Lab Results  Component Value Date   ETH <10 04/28/2017   ETH <5 10/22/2016    Metabolic Disorder Labs: Lab Results  Component Value Date   HGBA1C 5.4 12/21/2014   MPG 108 12/21/2014   No results found for: PROLACTIN Lab Results  Component Value Date   CHOL 121 12/21/2014   TRIG 66 12/21/2014   HDL 53 12/21/2014   CHOLHDL 2.3 12/21/2014   VLDL 13 12/21/2014   LDLCALC 55 12/21/2014    Physical Findings: AIMS: Facial and Oral Movements Muscles of Facial Expression: None, normal Lips and Perioral Area: None, normal Jaw: None, normal Tongue: None, normal,Extremity Movements Upper (arms, wrists, hands, fingers): Mild Lower (legs, knees, ankles, toes): None, normal, Trunk Movements Neck, shoulders, hips: None, normal, Overall Severity Severity of abnormal movements (highest score from questions above): Minimal Incapacitation due to abnormal movements: None, normal Patient's awareness of abnormal movements (rate only patient's report): Aware, no distress, Dental Status Current problems with teeth and/or dentures?: No(poor hygiene ) Does patient usually wear dentures?: No  CIWA:    COWS:     Musculoskeletal: Strength & Muscle Tone: within normal limits Gait & Station: normal Patient leans: N/A  Psychiatric Specialty Exam: Physical Exam  Nursing note and vitals reviewed.   Review of Systems  Constitutional: Negative for chills and fever.  Cardiovascular: Negative for chest pain and palpitations.   Gastrointestinal: Negative for abdominal pain, heartburn, nausea and vomiting.  Neurological: Positive for dizziness.  Psychiatric/Behavioral: Negative for depression, hallucinations and suicidal ideas. The patient is not nervous/anxious.     Blood pressure 102/66, pulse (!) 57, temperature 98.5 F (36.9 C), temperature source Oral, resp. rate 20, height 5\' 4"  (1.626 m), weight 71.2 kg (157 lb), SpO2 100 %.Body mass index is 26.95 kg/m.  General Appearance: Casual and Fairly Groomed  Eye Contact:  Good  Speech:  Clear and Coherent and Normal Rate  Volume:  Normal  Mood:  Euthymic  Affect:  Congruent and Constricted  Thought Process:  Coherent and Goal Directed  Orientation:  Full (Time, Place, and Person)  Thought Content:  Logical  Suicidal Thoughts:  No  Homicidal Thoughts:  No  Memory:  Immediate;   Fair Recent;   Fair Remote;   Fair  Judgement:  Fair  Insight:  Fair  Psychomotor Activity:  Normal  Concentration:  Concentration: Fair  Recall:  [employer] of Knowledge:  Fair  Language:  Good  Akathisia:  No  Handed:    AIMS (if indicated):     Assets:  Communication Skills Physical Health Resilience Social Support  ADL's:  Intact  Cognition:  WNL  Sleep:  Number of Hours: 5.5     Treatment Plan Summary: Daily contact with patient to assess and evaluate symptoms and progress in treatment and Medication management. Pt has improvement of mood symptoms and stability of her agitation. She is tolerating change to abilify without difficulty. She does not have a readily available safe plan for discharge, so we will continue to attempt to plan her discharge for when she has as safe location to stay. - Continue inpatient hospitalization - Bipolar I, current episode manic - Continue abilify 10mg  qDay - Continue ativan 1mg  PRN q6h prn agitation - Continue Remeron 15mg  qhs - Continueatarax 50mg  q6h prn anxiety  -  Continue trazodone 100mg  qhs prn insomnia - Increase collateral information - Discharge planning will be ongoing   Micheal Likens, MD 05/06/2017, 2:42 PM

## 2017-05-06 NOTE — Plan of Care (Signed)
Pt slept 5.5 hrs last night.

## 2017-05-06 NOTE — Progress Notes (Signed)
D: Patient observed resting in bed this AM. Promptly came up for meds when requested. Patient cautious, somewhat guarded during interaction. Patient verbalizes to this Clinical research associate no complaints at present. Patient's affect flat, mood depressed. Per self inventory and discussions with writer, rates depression at a 7/10, hopelessness at a 3/10 and anxiety at a 0/10. Rates sleep as fair, appetite as good, energy as normal and concentration as good.  States goal for today is "being able to have or find a nice place to stay, since I may be getting discharged. I will call a couple of place to see if they are able.." Denies pain, physical problems.   A: Medicated per orders, no prns requested or required. Level III obs in place for safety. Emotional support offered and self inventory reviewed. Encouraged completion of Suicide Safety Plan and programming participation. Discussed POC with MD, SW.    R: Patient verbalizes understanding of POC. Patient denies SI/HI/AVH and remains safe on level III obs. Will continue to monitor closely and make verbal contact frequently. Patient for possible discharge today.

## 2017-05-06 NOTE — Progress Notes (Signed)
Recreation Therapy Notes  Date: 05/06/17 Time: 0945 Location: [address] Dayroom  Group Topic: Self-Expression  Goal Area(s) Addresses:  Patient will successfully identify positive attributes about themselves.  Patient will successfully identify benefit of improved self-expression.   Intervention: None  Activity: Show Your Talent.  Patients were encouraged to share a talent they have with the group.  Patients could sing, dance, do poetry, draw, etc.  Education:  Self-Expression, Discharge Planning.   Education Outcome: Acknowledges education/In group clarification offered/Needs additional education  Clinical Observations/Feedback: Pt did not attend group.   Caroll Rancher, LRT/CTRS         Caroll Rancher A 05/06/2017 11:23 AM

## 2017-05-06 NOTE — [facility] Group Notes (Signed)
LCSW Group Therapy Note   05/06/2017 1:15pm   Type of Therapy and Topic:  Group Therapy:  Positive Affirmations   Participation Level:  Active  Description of Group: This group addressed positive affirmation toward self and others. Patients went around the room and identified two positive things about themselves and two positive things about a peer in the room. Patients reflected on how it felt to share something positive with others, to identify positive things about themselves, and to hear positive things from others. Patients were encouraged to have a daily reflection of positive characteristics or circumstances.  Therapeutic Goals 1. Patient will verbalize two of their positive qualities 2. Patient will demonstrate empathy for others by stating two positive qualities about a peer in the group 3. Patient will verbalize their feelings when voicing positive self affirmations and when voicing positive affirmations of others 4. Patients will discuss the potential positive impact on their wellness/recovery of focusing on positive traits of self and others. Summary of Patient Progress:  Chamile attended group and was there the entire time.  Chamile believes relapse is when something begins to go wrong. Her faith is important to her.  She believes that she can be restored and that she can always learn something new from a bad experience.  A positive person in her life is her mother.    Therapeutic Modalities Cognitive Behavioral Therapy Motivational Interviewing  Carlynn Herald Work 05/06/2017 1:22 PM

## 2017-05-07 NOTE — Progress Notes (Signed)
[facility] MD Progress Note  05/07/2017 2:57 PM Cathlene [name]  MRN:  [id number]  Subjective: Justus reports, "I'm doing okay. My mood is good, only that my medicines are making me feel kind of lazy. I have not been able to reach my family over the phone".   Objective: Dionicia Thwaits is a 41 y/o F with history of bipolar 1 disorder who was admitted with worsening symptoms of agitation and mania. She was restarted on home medications and transitioned from zyprexa to abilify. She had improvement of her mood symptoms during her admission, and today upon evaluation, she reports she is doing well overall. She is sleeping well and her appetite is adequate. Pt is no longer pressured or perseverative on her items being stolen. She is calm and cooperative. She denies SI/HI/AH/VH.  She endorses some anxiety about the uncertainty of her discharge plan as she has not been able to contact her significant other about staying with him and her ability to pay for an alternative place to stay is limited because her debit card is with her significant other. Pt does not feel that she would be able to maintain her own safety discharging without a specific place to stay, so we agreed to have her remain at [facility] until a safe discharge plan could be arranged. Pt complains of some light-headedness right when she wakes up, but she notes this is a chronic problem and she will continue to monitor it while she is at [facility]. She had no further questions, comments, or concerns.  05-07-17, today, Gelena presents alert, oriented & aware of situation. She is visible on the unit. Attending group sessions. She presents with a reactive affects. She says her mood is fine. She also thinks her medication are making her feel lazy. She denies any new issues. Says she has not been able to reach her family by phone. She is taking her medications. No disruptive behavior on the unit. Does not appear to be responding to any internal  stimuli.  Principal Problem: Bipolar affective disorder, manic, severe (HCC)  Diagnosis:   Patient Active Problem List   Diagnosis Date Noted  . Bipolar affective disorder, manic, severe (HCC) [F31.13] 04/30/2017  . Severe bipolar affective disorder with psychosis (HCC) [F31.89] 04/29/2017  . Schizoaffective disorder, bipolar type (HCC) [F25.0]   . Cocaine abuse (HCC) [F14.10] 12/31/2014  . Bipolar disorder, current episode manic without psychotic features, severe (HCC) [F31.13]   . Agitation [R45.1]   . Manic behavior (HCC) [F30.10]   . Hyperthyroidism [E05.90] 09/21/2013  . Marijuana abuse [F12.10] 04/12/2013  . History of CHF (congestive heart failure) [Z86.79] 02/01/2013  . Abscess of abdominal wall [L02.211] 10/18/2012  . HTN (hypertension) [I10] 04/08/2011  . Tachycardia [R00.0] 04/08/2011   Total Time spent with patient: 15 minutes  Past Psychiatric History: See M  Past Medical History:  Past Medical History:  Diagnosis Date  . Arm pain   . Bipolar affective disorder, currently manic, mild (HCC)   . CHF (congestive heart failure) (HCC)   . Eye globe prosthesis   . HTN (hypertension)   . Hyperthyroidism   . Sinus tachycardia     Past Surgical History:  Procedure Laterality Date  . DILATION AND CURETTAGE OF UTERUS    . EYE SURGERY     Family History:  Family History  Problem Relation Age of Onset  . Hypertension Other   . Emphysema Other   . Asthma Son   . Diabetes Maternal Uncle   . Diabetes Paternal  Grandmother    Family Psychiatric  History: See H&P  Social History:  Social History   Substance and Sexual Activity  Alcohol Use Yes  . Alcohol/week: 0.0 oz   Comment: Last drink: 1/2 beer PTA     Social History   Substance and Sexual Activity  Drug Use Yes  . Types: Marijuana   Comment: Pt sts "anything and everything"    Social History   Socioeconomic History  . Marital status: Married    Spouse name: None  . Number of children: None  .  Years of education: None  . Highest education level: None  Social Needs  . Financial resource strain: None  . Food insecurity - worry: None  . Food insecurity - inability: None  . Transportation needs - medical: None  . Transportation needs - non-medical: None  Occupational History  . None  Tobacco Use  . Smoking status: Current Every Day Smoker    Types: Cigarettes    Last attempt to quit: 08/21/2013    Years since quitting: 3.7  . Smokeless tobacco: Never Used  Substance and Sexual Activity  . Alcohol use: Yes    Alcohol/week: 0.0 oz    Comment: Last drink: 1/2 beer PTA  . Drug use: Yes    Types: Marijuana    Comment: Pt sts "anything and everything"  . Sexual activity: Yes    Birth control/protection: Injection    Comment: crack cocaine  Other Topics Concern  . None  Social History Narrative  . None   Additional Social History:    Pain Medications: none Prescriptions: none Over the Counter: none History of alcohol / drug use?: No history of alcohol / drug abuse  Sleep: Good  Appetite:  Good  Current Medications: Current Facility-Administered Medications  Medication Dose Route Frequency Provider Last Rate Last Dose  . alum & mag hydroxide-simeth (MAALOX/MYLANTA) 200-200-20 MG/5ML suspension 30 mL  30 mL Oral Q4H PRN Charm Rings, NP   30 mL at 05/04/17 1557  . amantadine (SYMMETREL) capsule 100 mg  100 mg Oral BID Oneta Rack, NP   100 mg at 05/07/17 1020  . antiseptic oral rinse (BIOTENE) solution 15 mL  15 mL Mouth Rinse PRN Micheal Likens, MD   15 mL at 05/06/17 1610  . ARIPiprazole (ABILIFY) tablet 10 mg  10 mg Oral Daily Micheal Likens, MD   10 mg at 05/07/17 1021  . calcium carbonate (TUMS - dosed in mg elemental calcium) chewable tablet 200 mg of elemental calcium  1 tablet Oral BID Oneta Rack, NP   200 mg of elemental calcium at 05/07/17 1021  . feeding supplement (ENSURE ENLIVE) (ENSURE ENLIVE) liquid 237 mL  237 mL Oral BID  BM Oneta Rack, NP   237 mL at 05/07/17 1022  . hydrOXYzine (ATARAX/VISTARIL) tablet 50 mg  50 mg Oral TID PRN Micheal Likens, MD   50 mg at 05/06/17 0146  . ibuprofen (ADVIL,MOTRIN) tablet 600 mg  600 mg Oral Q6H PRN Micheal Likens, MD   600 mg at 05/04/17 1805  . magnesium hydroxide (MILK OF MAGNESIA) suspension 30 mL  30 mL Oral Daily PRN Charm Rings, NP      . mirtazapine (REMERON) tablet 15 mg  15 mg Oral QHS Oneta Rack, NP   15 mg at 05/06/17 0146  . nicotine polacrilex (NICORETTE) gum 2 mg  2 mg Oral PRN Oneta Rack, NP   2 mg at 05/06/17 1811  .  traZODone (DESYREL) tablet 100 mg  100 mg Oral QHS,MR X 1 Micheal Likens, MD   100 mg at 05/06/17 0147  . triamcinolone 0.1 % cream : eucerin cream, 1:1   Topical TID PRN Oneta Rack, NP   1 application at 05/01/17 1257  . triamterene-hydrochlorothiazide (MAXZIDE-25) 37.5-25 MG per tablet 1 tablet  1 tablet Oral Daily Oneta Rack, NP   1 tablet at 05/07/17 1021   Lab Results: No results found for this or any previous visit (from the past 48 hour(s)).  Blood Alcohol level:  Lab Results  Component Value Date   ETH <10 04/28/2017   ETH <5 10/22/2016   Metabolic Disorder Labs: Lab Results  Component Value Date   HGBA1C 5.4 12/21/2014   MPG 108 12/21/2014   No results found for: PROLACTIN Lab Results  Component Value Date   CHOL 121 12/21/2014   TRIG 66 12/21/2014   HDL 53 12/21/2014   CHOLHDL 2.3 12/21/2014   VLDL 13 12/21/2014   LDLCALC 55 12/21/2014   Physical Findings: AIMS: Facial and Oral Movements Muscles of Facial Expression: None, normal Lips and Perioral Area: None, normal Jaw: None, normal Tongue: None, normal,Extremity Movements Upper (arms, wrists, hands, fingers): Mild Lower (legs, knees, ankles, toes): None, normal, Trunk Movements Neck, shoulders, hips: None, normal, Overall Severity Severity of abnormal movements (highest score from questions above):  Minimal Incapacitation due to abnormal movements: None, normal Patient's awareness of abnormal movements (rate only patient's report): Aware, no distress, Dental Status Current problems with teeth and/or dentures?: No Does patient usually wear dentures?: No  CIWA:    COWS:     Musculoskeletal: Strength & Muscle Tone: within normal limits Gait & Station: normal Patient leans: N/A  Psychiatric Specialty Exam: Physical Exam  Nursing note and vitals reviewed.   Review of Systems  Constitutional: Negative for chills and fever.  Cardiovascular: Negative for chest pain and palpitations.  Gastrointestinal: Negative for abdominal pain, heartburn, nausea and vomiting.  Neurological: Positive for dizziness.  Psychiatric/Behavioral: Negative for depression, hallucinations and suicidal ideas. The patient is not nervous/anxious.     Blood pressure 102/66, pulse (!) 57, temperature 98.5 F (36.9 C), temperature source Oral, resp. rate 20, height 5\' 4"  (1.626 m), weight 71.2 kg (157 lb), SpO2 100 %.Body mass index is 26.95 kg/m.  General Appearance: Casual and Fairly Groomed  Eye Contact:  Good  Speech:  Clear and Coherent and Normal Rate  Volume:  Normal  Mood:  Euthymic  Affect:  Congruent and Constricted  Thought Process:  Coherent and Goal Directed  Orientation:  Full (Time, Place, and Person)  Thought Content:  Logical  Suicidal Thoughts:  No  Homicidal Thoughts:  No  Memory:  Immediate;   Fair Recent;   Fair Remote;   Fair  Judgement:  Fair  Insight:  Fair  Psychomotor Activity:  Normal  Concentration:  Concentration: Fair  Recall:  [employer] of Knowledge:  Fair  Language:  Good  Akathisia:  No  Handed:    AIMS (if indicated):     Assets:  Communication Skills Physical Health Resilience Social Support  ADL's:  Intact  Cognition:  WNL  Sleep:  Number of Hours: 6.5   Treatment Plan Summary: Daily contact with patient to assess and evaluate symptoms and progress in  treatment and Medication management. Pt has improvement of mood symptoms and stability of her agitation. She is tolerating change to abilify without difficulty. She does not have a readily available safe  plan for discharge, so we will continue to attempt to plan her discharge for when she has as safe location to stay.  Will continue today 05/07/2017 plan as below except where it is noted.  - Continue inpatient hospitalization - Bipolar I, current episode manic - Continue abilify 10mg  qDay - Continue ativan 1mg  PRN q6h prn agitation - Continue Remeron 15mg  qhs - Continueatarax 50mg  q6h prn anxiety  - Continue trazodone 100mg  qhs prn insomnia - Increase collateral information; patient reports today that she could not get hold of her family over the phone. - Discharge planning will be ongoing  Sanjuana KavaNwoko, Deanthony Maull I, NP, PMHNP, FNP-BC. 05/07/2017, 2:57 PMPatient ID: Mauro Kaufmannhamelia Walker McNeil, female   DOB: 1976-03-12, 41 y.o.   MRN: [id number]

## 2017-05-07 NOTE — Progress Notes (Signed)
Adult Psychoeducational Group Note  Date:  05/07/2017 Time:  9:35 PM  Group Topic/Focus:  Wrap-Up Group:   The focus of this group is to help patients review their daily goal of treatment and discuss progress on daily workbooks.  Participation Level:  Active  Participation Quality:  Appropriate  Affect:  Appropriate  Cognitive:  Oriented  Insight: Appropriate  Engagement in Group:  Engaged  Modes of Intervention:  Activity  Additional Comments:  Pt rated her day a 7. Goal is to get back in school and stay on meds.  Natasha Mead 05/07/2017, 9:35 PM

## 2017-05-07 NOTE — Progress Notes (Signed)
D: Pt awake in dayroom for majority of this watching TV and verbally engaged with peers this shift. Presents anxious and guarded on initial contact, but forwards during interactions. Denies SI, HI, AVH and pain at this time. Rates her depression 3/10, hopelessness 0/10 and anxiety 4/10 "my medicines are helping me though, I'm just ready to go home". Reports good appetite and sleep with good concentration and low energy level.  A: Scheduled medications administered as ordered with verbal education and effects monitored. Emotional support and availability offered to pt. Encouraged pt to voice concerns and comply with treatment regimen. Safety checks maintained at Q 15 minutes intervals.  R: Pt attended and participated in unit groups. Compliant with medications as ordered. Denies adverse drug reactions. Tolerates all PO intake well. Remains safe on and off unit.

## 2017-05-07 NOTE — [facility] Group Notes (Signed)
  [facility] LCSW Group Therapy Note  Date/Time:  05/07/2017 11:15AM-12:00PM  Type of Therapy and Topic:  Group Therapy:  Feelings About Hospitalization  Participation Level:  Active   Description of Group This process group involved patients discussing their feelings related to being hospitalized, as well as the benefits they see to being in the hospital.  These feelings and benefits were itemized.  The group then brainstormed specific ways in which they could seek those same benefits when they discharge and return home.  Therapeutic Goals 1. Patient will identify and describe positive and negative feelings related to hospitalization 2. Patient will verbalize benefits of hospitalization to themselves personally 3. Patients will brainstorm together ways they can obtain similar benefits in the outpatient setting, identify barriers to wellness and possible solutions  Summary of Patient Progress:  The patient expressed her primary feelings about being hospitalized are that the food and medication changes have been good for her.  She was able to identify her medication name, when she can start on an injection, and how she will moving forward take her meds.  She stated she is not currently active with [facility] but does understand the process to become activated again.  She also agreed that she would follow up immediately upon discharge in order to get her injection on time.  Therapeutic Modalities Cognitive Behavioral Therapy Motivational Interviewing    Christine Mantle, LCSW 05/07/2017, 11:57 AM

## 2017-05-08 NOTE — [facility] Group Notes (Signed)
[facility] LCSW Group Therapy Note  Date/Time:  05/08/2017  11:00AM-12:00PM  Type of Therapy and Topic:  Group Therapy:  Music and Mood  Participation Level:  Did Not Attend   Description of Group: In this process group, members listened to a variety of genres of music and identified that different types of music evoke different responses.  Patients were encouraged to identify music that was soothing for them and music that was energizing for them.  Patients discussed how this knowledge can help with wellness and recovery in various ways including managing depression and anxiety as well as encouraging healthy sleep habits.    Therapeutic Goals: 1. Patients will explore the impact of different varieties of music on mood 2. Patients will verbalize the thoughts they have when listening to different types of music 3. Patients will identify music that is soothing to them as well as music that is energizing to them 4. Patients will discuss how to use this knowledge to assist in maintaining wellness and recovery 5. Patients will explore the use of music as a coping skill  Summary of Patient Progress:  N/A  Therapeutic Modalities: Solution Focused Brief Therapy Motivational Interviewing Activity   Ambrose Mantle, LCSW 05/08/2017 8:23 AM

## 2017-05-08 NOTE — Progress Notes (Signed)
Writer has observed patient up in the dayroom watching tv and interacting appropriately with peers. She attended group and was complaint with her medications. She has been pleasant and cooperative. Support given and safety maintained on unit with 15 min checks.

## 2017-05-08 NOTE — Progress Notes (Signed)
Patient ID: Christine Cobb, female   DOB: Apr 02, 1976, 41 y.o.   MRN: [id number]  DAR: Christine Cobb currently denies SI/HI and A/V Hallucinations. She reports that her sleep is good, appetite is fair, energy level is normal, and concentration is good. She rates her depression level is 0/10, hopelessness level 3/10, and anxiety level is 7/10. She does not report any pain or discomfort at this time. Support and encouragement provided to the patient however at this time she remains minimal with staff. She is cooperative. 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 Scheduled medications administered to patient late due to patient not getting out of bed. Q15 minute checks are maintained for safety.

## 2017-05-08 NOTE — Progress Notes (Signed)
[facility] MD Progress Note  05/08/2017 1:12 PM Christine Cobb  MRN:  [id number]  Subjective: Christine Cobb reports, "Cobb'm doing better, Cobb'm just feeling sleepy after Cobb took my morning medicines, taking a nap".  Objective: Christine Cobb is a 41 y/o F with history of bipolar 1 disorder who was admitted with worsening symptoms of agitation and mania. She was restarted on home medications and transitioned from zyprexa to abilify. She had improvement of her mood symptoms during her admission, and today upon evaluation, she reports she is doing well overall. She is sleeping well and her appetite is adequate. Pt is no longer pressured or perseverative on her items being stolen. She is calm and cooperative. She denies SI/HI/AH/VH.  She endorses some anxiety about the uncertainty of her discharge plan as she has not been able to contact her significant other about staying with him and her ability to pay for an alternative place to stay is limited because her debit card is with her significant other. Pt does not feel that she would be able to maintain her own safety discharging without a specific place to stay, so we agreed to have her remain at [facility] until a safe discharge plan could be arranged. Pt complains of some light-headedness right when she wakes up, but she notes this is a chronic problem and she will continue to monitor it while she is at [facility]. She had no further questions, comments, or concerns.  05-08-17, today, Christine Cobb is lying down in her bed. She presents alert, oriented & aware of situation. She says she took her morning medications & became sleepy & has to lie down to take a nap. She says she is attending group sessions. She presents with a reactive affects. She says her mood is better than when she came to the hospital. She also said yesterday that she thought her medications were making her feel lazy. She denies any new issues other than the above stated. Says she continues to try to reach her family  by phone, still unsuccessful. She is taking her medications. No disruptive behavior on the unit. Does not appear to be responding to any internal stimuli.  Principal Problem: Bipolar affective disorder, manic, severe (HCC)  Diagnosis:   Patient Active Problem List   Diagnosis Date Noted  . Bipolar affective disorder, manic, severe (HCC) [F31.13] 04/30/2017  . Severe bipolar affective disorder with psychosis (HCC) [F31.89] 04/29/2017  . Schizoaffective disorder, bipolar type (HCC) [F25.0]   . Cocaine abuse (HCC) [F14.10] 12/31/2014  . Bipolar disorder, current episode manic without psychotic features, severe (HCC) [F31.13]   . Agitation [R45.1]   . Manic behavior (HCC) [F30.10]   . Hyperthyroidism [E05.90] 09/21/2013  . Marijuana abuse [F12.10] 04/12/2013  . History of CHF (congestive heart failure) [Z86.79] 02/01/2013  . Abscess of abdominal wall [L02.211] 10/18/2012  . HTN (hypertension) [I10] 04/08/2011  . Tachycardia [R00.0] 04/08/2011   Total Time spent with patient: 15 minutes  Past Psychiatric History: See M  Past Medical History:  Past Medical History:  Diagnosis Date  . Arm pain   . Bipolar affective disorder, currently manic, mild (HCC)   . CHF (congestive heart failure) (HCC)   . Eye globe prosthesis   . HTN (hypertension)   . Hyperthyroidism   . Sinus tachycardia     Past Surgical History:  Procedure Laterality Date  . DILATION AND CURETTAGE OF UTERUS    . EYE SURGERY     Family History:  Family History  Problem Relation Age of  Onset  . Hypertension Other   . Emphysema Other   . Asthma Son   . Diabetes Maternal Uncle   . Diabetes Paternal Grandmother    Family Psychiatric  History: See H&P  Social History:  Social History   Substance and Sexual Activity  Alcohol Use Yes  . Alcohol/week: 0.0 oz   Comment: Last drink: 1/2 beer PTA     Social History   Substance and Sexual Activity  Drug Use Yes  . Types: Marijuana   Comment: Pt sts "anything  and everything"    Social History   Socioeconomic History  . Marital status: Married    Spouse name: None  . Number of children: None  . Years of education: None  . Highest education level: None  Social Needs  . Financial resource strain: None  . Food insecurity - worry: None  . Food insecurity - inability: None  . Transportation needs - medical: None  . Transportation needs - non-medical: None  Occupational History  . None  Tobacco Use  . Smoking status: Current Every Day Smoker    Types: Cigarettes    Last attempt to quit: 08/21/2013    Years since quitting: 3.7  . Smokeless tobacco: Never Used  Substance and Sexual Activity  . Alcohol use: Yes    Alcohol/week: 0.0 oz    Comment: Last drink: 1/2 beer PTA  . Drug use: Yes    Types: Marijuana    Comment: Pt sts "anything and everything"  . Sexual activity: Yes    Birth control/protection: Injection    Comment: crack cocaine  Other Topics Concern  . None  Social History Narrative  . None   Additional Social History:    Pain Medications: none Prescriptions: none Over the Counter: none History of alcohol / drug use?: No history of alcohol / drug abuse  Sleep: Good  Appetite:  Good  Current Medications: Current Facility-Administered Medications  Medication Dose Route Frequency Provider Last Rate Last Dose  . alum & mag hydroxide-simeth (MAALOX/MYLANTA) 200-200-20 MG/5ML suspension 30 mL  30 mL Oral Q4H PRN Charm Rings, Christine Cobb   30 mL at 05/04/17 1557  . amantadine (SYMMETREL) capsule 100 mg  100 mg Oral BID Oneta Rack, Christine Cobb   100 mg at 05/08/17 1215  . antiseptic oral rinse (BIOTENE) solution 15 mL  15 mL Mouth Rinse PRN Micheal Likens, MD   15 mL at 05/06/17 1610  . ARIPiprazole (ABILIFY) tablet 10 mg  10 mg Oral Daily Micheal Likens, MD   10 mg at 05/08/17 1216  . calcium carbonate (TUMS - dosed in mg elemental calcium) chewable tablet 200 mg of elemental calcium  1 tablet Oral BID Oneta Rack, Christine Cobb   200 mg of elemental calcium at 05/08/17 1216  . feeding supplement (ENSURE ENLIVE) (ENSURE ENLIVE) liquid 237 mL  237 mL Oral BID BM Oneta Rack, Christine Cobb   237 mL at 05/07/17 1645  . hydrOXYzine (ATARAX/VISTARIL) tablet 50 mg  50 mg Oral TID PRN Micheal Likens, MD   50 mg at 05/06/17 0146  . ibuprofen (ADVIL,MOTRIN) tablet 600 mg  600 mg Oral Q6H PRN Micheal Likens, MD   600 mg at 05/04/17 1805  . magnesium hydroxide (MILK OF MAGNESIA) suspension 30 mL  30 mL Oral Daily PRN Charm Rings, Christine Cobb      . mirtazapine (REMERON) tablet 15 mg  15 mg Oral QHS Oneta Rack, Christine Cobb   15 mg at  05/07/17 2105  . nicotine polacrilex (NICORETTE) gum 2 mg  2 mg Oral PRN Oneta Rack, Christine Cobb   2 mg at 05/07/17 1644  . traZODone (DESYREL) tablet 100 mg  100 mg Oral QHS,MR X 1 Micheal Likens, MD   100 mg at 05/07/17 2105  . triamcinolone 0.1 % cream : eucerin cream, 1:1   Topical TID PRN Oneta Rack, Christine Cobb   1 application at 05/01/17 1257  . triamterene-hydrochlorothiazide (MAXZIDE-25) 37.5-25 MG per tablet 1 tablet  1 tablet Oral Daily Oneta Rack, Christine Cobb   1 tablet at 05/08/17 1216   Lab Results: No results found for this or any previous visit (from the past 48 hour(s)).  Blood Alcohol level:  Lab Results  Component Value Date   ETH <10 04/28/2017   ETH <5 10/22/2016   Metabolic Disorder Labs: Lab Results  Component Value Date   HGBA1C 5.4 12/21/2014   MPG 108 12/21/2014   No results found for: PROLACTIN Lab Results  Component Value Date   CHOL 121 12/21/2014   TRIG 66 12/21/2014   HDL 53 12/21/2014   CHOLHDL 2.3 12/21/2014   VLDL 13 12/21/2014   LDLCALC 55 12/21/2014   Physical Findings: AIMS: Facial and Oral Movements Muscles of Facial Expression: None, normal Lips and Perioral Area: None, normal Jaw: None, normal Tongue: None, normal,Extremity Movements Upper (arms, wrists, hands, fingers): Mild Lower (legs, knees, ankles, toes): None, normal,  Trunk Movements Neck, shoulders, hips: None, normal, Overall Severity Severity of abnormal movements (highest score from questions above): Minimal Incapacitation due to abnormal movements: None, normal Patient's awareness of abnormal movements (rate only patient's report): Aware, no distress, Dental Status Current problems with teeth and/or dentures?: No Does patient usually wear dentures?: No  CIWA:    COWS:     Musculoskeletal: Strength & Muscle Tone: within normal limits Gait & Station: normal Patient leans: N/A  Psychiatric Specialty Exam: Physical Exam  Nursing note and vitals reviewed.   Review of Systems  Constitutional: Negative for chills and fever.  Cardiovascular: Negative for chest pain and palpitations.  Gastrointestinal: Negative for abdominal pain, heartburn, nausea and vomiting.  Neurological: Positive for dizziness.  Psychiatric/Behavioral: Negative for depression, hallucinations and suicidal ideas. The patient is not nervous/anxious.     Blood pressure 98/75, pulse 67, temperature 98.4 F (36.9 C), temperature source Oral, resp. rate 18, height 5\' 4"  (1.626 m), weight 71.2 kg (157 lb), SpO2 100 %.Body mass index is 26.95 kg/m.  General Appearance: Casual and Fairly Groomed  Eye Contact:  Good  Speech:  Clear and Coherent and Normal Rate  Volume:  Normal  Mood:  Euthymic  Affect:  Congruent and reactive.  Thought Process:  Coherent and Goal Directed  Orientation:  Full (Time, Place, and Person)  Thought Content:  Logical, denies any hallucinations, delusions or paranoia.  Suicidal Thoughts:  No  Homicidal Thoughts:  No  Memory:  Immediate;   Good Recent;   Good Remote;   Fair  Judgement:  Fair  Insight:  Fair  Psychomotor Activity:  Normal  Concentration:  Concentration: Good and Attention Span: Good  Recall:  Good  Fund of Knowledge:  Fair  Language:  Good  Akathisia:  No  Handed:    AIMS (if indicated):     Assets:  Investment banker, corporate Physical Health Resilience Social Support  ADL's:  Intact  Cognition:  WNL  Sleep:  Number of Hours: 6.75   Treatment Plan Summary: Daily contact with patient to assess  and evaluate symptoms and progress in treatment and Medication management. Pt has improvement of mood symptoms and stability of her agitation. She is tolerating her medications. She does not have a readily available safe plan for discharge, so we will continue to attempt to plan her discharge for when she has as safe location to stay.  Will continue today 05/08/2017 plan as below except where it is noted.  - Continue inpatient hospitalization - Bipolar Cobb, current episode manic - Continue abilify 10mg  qDay - Continue ativan 1mg  PRN q6h prn agitation - Continue Remeron 15mg  qhs - Continueatarax 50mg  q6h prn anxiety  - Continue trazodone 100mg  qhs prn insomnia - Increase collateral information; patient reports today that she could not get hold of her family over the phone. - Discharge planning will be ongoing  Christine Cobb, Christine Gordillo Cobb, Christine Cobb, Christine Cobb, Christine Cobb. 05/08/2017, 1:12 PMPatient ID: Christine Cobb, female   DOB: Jun 24, 1976, 41 y.o.   MRN: [id number]

## 2017-05-08 NOTE — Progress Notes (Addendum)
Patient ID: Christine Cobb, female   DOB: [birth year], 41 y.o.   MRN: [id number]  Patient is currently resting in bed with her eyes closed. Patient appears in no current distress. Will administer medications when patient is up OOB. Q15 minute safety checks are maintained.

## 2017-05-09 MED ORDER — NICOTINE POLACRILEX 2 MG MT GUM: 2.0000 mg | CHEWING_GUM | OROMUCOSAL | 0 refills | Status: DC | PRN

## 2017-05-09 MED ORDER — AMANTADINE HCL 100 MG PO CAPS: 100.0000 mg | ORAL_CAPSULE | Freq: Two times a day (BID) | ORAL | 0 refills | Status: DC

## 2017-05-09 MED ORDER — HYDROXYZINE HCL 50 MG PO TABS: 50.0000 mg | ORAL_TABLET | Freq: Three times a day (TID) | ORAL | 0 refills | Status: DC | PRN

## 2017-05-09 MED ORDER — TRIAMTERENE-HCTZ 37.5-25 MG PO TABS: 1.0000 | ORAL_TABLET | Freq: Every day | ORAL | 0 refills | Status: DC

## 2017-05-09 MED ORDER — ARIPIPRAZOLE 10 MG PO TABS: 10.0000 mg | ORAL_TABLET | Freq: Every day | ORAL | 0 refills | Status: DC

## 2017-05-09 MED ORDER — MIRTAZAPINE 15 MG PO TABS: 15.0000 mg | ORAL_TABLET | Freq: Every day | ORAL | 0 refills | Status: DC

## 2017-05-09 NOTE — Discharge Summary (Signed)
Physician Discharge Summary Note  Patient:  Christine Cobb is an 41 y.o., female MRN:  [id number] DOB:  Nov 06, 1975 Patient phone:  [phone] (home)  Patient address:   [address] [place] [address],  Total Time spent with patient: 30 minutes  Date of Admission:  04/30/2017 Date of Discharge: 05/09/2017  Reason for Admission: Per HPI- [name] has been agitated and disorganized since admission. She has been tangential and disorganized in thought process. She was yelling and threatening staff this morning. She removed her scrub top this morning and was walking around the unit. She has required several emergency medications for agitation including Geodon 10 mg IM (x 4) yesterday and Thorazine 100 mg with Bendaryl 50 mg IM this morning. She is also receiving her prescribed medications. She was unable to participate in interview this morning due to agitation and required the seclusion room    Principal Problem: Bipolar affective disorder, manic, severe [facility]) Discharge Diagnoses: Patient Active Problem List   Diagnosis Date Noted  . Bipolar affective disorder, manic, severe (HCC) [F31.13] 04/30/2017  . Severe bipolar affective disorder with psychosis (HCC) [F31.89] 04/29/2017  . Schizoaffective disorder, bipolar type (HCC) [F25.0]   . Cocaine abuse (HCC) [F14.10] 12/31/2014  . Bipolar disorder, current episode manic without psychotic features, severe (HCC) [F31.13]   . Agitation [R45.1]   . Manic behavior (HCC) [F30.10]   . Hyperthyroidism [E05.90] 09/21/2013  . Marijuana abuse [F12.10] 04/12/2013  . History of CHF (congestive heart failure) [Z86.79] 02/01/2013  . Abscess of abdominal wall [L02.211] 10/18/2012  . HTN (hypertension) [I10] 04/08/2011  . Tachycardia [R00.0] 04/08/2011    Past Psychiatric History:   Past Medical History:  Past Medical History:  Diagnosis Date  . Arm pain   . Bipolar affective disorder, currently manic, mild (HCC)   . CHF  (congestive heart failure) (HCC)   . Eye globe prosthesis   . HTN (hypertension)   . Hyperthyroidism   . Sinus tachycardia     Past Surgical History:  Procedure Laterality Date  . DILATION AND CURETTAGE OF UTERUS    . EYE SURGERY     Family History:  Family History  Problem Relation Age of Onset  . Hypertension Other   . Emphysema Other   . Asthma Son   . Diabetes Maternal Uncle   . Diabetes Paternal Grandmother    Family Psychiatric  History:  Social History:  Social History   Substance and Sexual Activity  Alcohol Use Yes  . Alcohol/week: 0.0 oz   Comment: Last drink: 1/2 beer PTA     Social History   Substance and Sexual Activity  Drug Use Yes  . Types: Marijuana   Comment: Pt sts "anything and everything"    Social History   Socioeconomic History  . Marital status: Married    Spouse name: None  . Number of children: None  . Years of education: None  . Highest education level: None  Social Needs  . Financial resource strain: None  . Food insecurity - worry: None  . Food insecurity - inability: None  . Transportation needs - medical: None  . Transportation needs - non-medical: None  Occupational History  . None  Tobacco Use  . Smoking status: Current Every Day Smoker    Types: Cigarettes    Last attempt to quit: 08/21/2013    Years since quitting: 3.7  . Smokeless tobacco: Never Used  Substance and Sexual Activity  . Alcohol use: Yes    Alcohol/week: 0.0 oz  Comment: Last drink: 1/2 beer PTA  . Drug use: Yes    Types: Marijuana    Comment: Pt sts "anything and everything"  . Sexual activity: Yes    Birth control/protection: Injection    Comment: crack cocaine  Other Topics Concern  . None  Social History Narrative  . None    Hospital Course:  Cia Christine Cobb was admitted for Bipolar affective disorder, manic, severe (HCC) and crisis management.  Pt wasPamella Cobb treated discharged with the medications listed below under Medication List.   Medical problems were identified and treated as needed.  Home medications were restarted as appropriate.  Improvement was monitored by observation and Mauro Kaufmannhamelia Christine Cobb 's daily report of symptom reduction.  Emotional and mental status was monitored by daily self-inventory reports completed by Mauro Kaufmannhamelia Christine Cobb and clinical staff.         Marlenne Christine PertWalker Cobb was evaluated by the treatment team for stability and plans for continued recovery upon discharge. Christine Christine PertWalker Cobb 's motivation was an integral factor for scheduling further treatment. Employment, transportation, bed availability, health status, family support, and any pending legal issues were also considered during hospital stay. Pt was offered further treatment options upon discharge including but not limited to Residential, Intensive Outpatient, and Outpatient treatment.  Christine Christine PertWalker Cobb will follow up with the services as listed below under Follow Up Information.     Upon completion of this admission the patient was both mentally and medically stable for discharge denying suicidal/homicidal ideation, auditory/visual/tactile hallucinations, delusional thoughts and paranoia.    Christine Cobb responded well to treatment with Abilify 10 mg, Remeron 15 mg and Symmetrel 100 mg PO BID. Pt demonstrated improvement without reported or observed adverse effects to the point of stability appropriate for outpatient management. Pertinent labs include: BMP and Urinalysis to repeat urine culture, for which outpatient follow-up is necessary for lab recheck as mentioned below. Reviewed CBC, CMP, BAL, and UDS; all unremarkable aside from noted exceptions.   Physical Findings: AIMS: Facial and Oral Movements Muscles of Facial Expression: None, normal Lips and Perioral Area: None, normal Jaw: None, normal Tongue: None, normal,Extremity Movements Upper (arms, wrists, hands, fingers): Mild Lower (legs, knees, ankles, toes):  None, normal, Trunk Movements Neck, shoulders, hips: None, normal, Overall Severity Severity of abnormal movements (highest score from questions above): Minimal Incapacitation due to abnormal movements: None, normal Patient's awareness of abnormal movements (rate only patient's report): Aware, no distress, Dental Status Current problems with teeth and/or dentures?: No Does patient usually wear dentures?: No  CIWA:    COWS:     Musculoskeletal: Strength & Muscle Tone: within normal limits Gait & Station: normal Patient leans: Right  Psychiatric Specialty Exam: See SRA by MD Physical Exam  ROS  Blood pressure 98/65, pulse (!) 51, temperature 98.3 F (36.8 C), temperature source Oral, resp. rate 20, height 5\' 4"  (1.626 m), weight 63 kg (139 lb), SpO2 100 %.Body mass index is 23.86 kg/m.   Have you used any form of tobacco in the last 30 days? (Cigarettes, Smokeless Tobacco, Cigars, and/or Pipes): No  Has this patient used any form of tobacco in the last 30 days? (Cigarettes, Smokeless Tobacco, Cigars, and/or Pipes) Yes, Yes, A prescription for an FDA-approved tobacco cessation medication was offered at discharge and the patient refused  Blood Alcohol level:  Lab Results  Component Value Date   [facility] <10 04/28/2017   ETH <5 10/22/2016    Metabolic Disorder Labs:  Lab Results  Component Value Date  HGBA1C 5.4 12/21/2014   MPG 108 12/21/2014   No results found for: PROLACTIN Lab Results  Component Value Date   CHOL 121 12/21/2014   TRIG 66 12/21/2014   HDL 53 12/21/2014   CHOLHDL 2.3 12/21/2014   VLDL 13 12/21/2014   LDLCALC 55 12/21/2014    See Psychiatric Specialty Exam and Suicide Risk Assessment completed by Attending Physician prior to discharge.  Discharge destination:  Home  Is patient on multiple antipsychotic therapies at discharge:  No   Has Patient had three or more failed trials of antipsychotic monotherapy by history:  No  Recommended Plan for Multiple  Antipsychotic Therapies: NA  Discharge Instructions    Diet - low sodium heart healthy   Complete by:  As directed    Discharge instructions   Complete by:  As directed    Increase activity slowly   Complete by:  As directed      Allergies as of 05/09/2017      Reactions   Amoxil [amoxicillin] Anaphylaxis   Ketorolac Tromethamine Hives, Swelling   Prenatal [b-plex Plus] Nausea Only   Tegretol [carbamazepine] Other (See Comments)   Swelling, skin peeling, skin discoloration   B-plex Plus Nausea Only   Prenatal   Tegretol [carbamazepine] Swelling, Other (See Comments)   Skin peeling Skin discoloring       Medication List    STOP taking these medications   hydrOXYzine 25 MG capsule Commonly known as:  VISTARIL   temazepam 7.5 MG capsule Commonly known as:  RESTORIL   traZODone 100 MG tablet Commonly known as:  DESYREL   ziprasidone 60 MG capsule Commonly known as:  GEODON     TAKE these medications     Indication  amantadine 100 MG capsule Commonly known as:  SYMMETREL Take 1 capsule (100 mg total) 2 (two) times daily by mouth.  Indication:  MOOD STABLILZATION   ARIPiprazole 10 MG tablet Commonly known as:  ABILIFY Take 1 tablet (10 mg total) daily by mouth. Start taking on:  05/10/2017  Indication:  Cocaine Dependence, Major Depressive Disorder   hydrOXYzine 50 MG tablet Commonly known as:  ATARAX/VISTARIL Take 1 tablet (50 mg total) 3 (three) times daily as needed by mouth for anxiety.  Indication:  Feeling Anxious   mirtazapine 15 MG tablet Commonly known as:  REMERON Take 1 tablet (15 mg total) at bedtime by mouth.  Indication:  Major Depressive Disorder   nicotine polacrilex 2 MG gum Commonly known as:  NICORETTE Take 1 each (2 mg total) as needed by mouth for smoking cessation.  Indication:  Nicotine Addiction   triamterene-hydrochlorothiazide 37.5-25 MG tablet Commonly known as:  MAXZIDE-25 Take 1 tablet daily by mouth. Start taking on:   05/10/2017  Indication:  High Blood Pressure Disorder      Follow-up Information    [facility] Follow up on 05/10/2017.   Why:  Tuesday at 10;00 with your therapist Contact information: [address] [phone]       [facility] Follow up on 05/11/2017.   Why:  Hospital disharge follow up on 11/14 at 8 AM.  Please call to cancel/reschedule if needed.  Contact information: [address] [place] [address] [phone]           Follow-up recommendations:  Activity:  as tolerated Diet:  heart healthy  Comments:  Take all medications as prescribed. Keep all follow-up appointments as scheduled.  Do not consume alcohol or use illegal drugs while on  prescription medications. Report any adverse effects from your medications to your primary care provider promptly.  In the event of recurrent symptoms or worsening symptoms, call 911, a crisis hotline, or go to the nearest emergency department for evaluation.  Signed: Oneta Rack, NP 05/09/2017, 11:33 AM   Patient seen, Suicide Assessment Completed.  Disposition Plan Reviewed   Breylin Kjellberg is a 41 y/o F with history of bipolar disorder who was admitted with worsening mood symptoms and agitation. She was restarted on home medications and transitioned from zyprexa to abilify. Today upon evaluation, she reports that she is doing well overall. She is sleeping well and her appetite is good. She denies SI/HI/AH/VH. She has been participating in groups and the therapeutic milieu. She is future oriented about returning to home and her outpatient follow up. She is able to engage in safety planning including plan to return to [facility] if she feels unable to maintain her own safety. She had no further questions, comments, or concerns.  Plan Of Care/Follow-up recommendations:  - Discharge to outpatient level of care - Bipolar I, current episode manic -Continueabilify 10mg  qDay -  Continue Remeron 15mg  qhs - Continueatarax 50mg  q6h prn anxiety - Continuetrazodone 100mg  qhs prn insomnia   Activity:  as tolerated Diet:  normal Tests:  NA Other:  see above for DC plan  Micheal Likens, MD

## 2017-05-09 NOTE — Progress Notes (Signed)
Writer has observed patient up in the dayroom watching tv and interacting with peers appropriately. She attended group and took medications scheduled. She reports that she is hopeful to discharge on tomorrow. She has been very polite and cooperative. She did Archivist that her medication has her sleeping a lot more than what she normally does. Writer informed hero to speak with her doctor if feels that medication is too strong. Support givne and safety maintained on unit with 15 min checks.

## 2017-05-09 NOTE — Progress Notes (Signed)
Patient ID: Christine Cobb, female   DOB: March 17, 1976, 41 y.o.   MRN: [id number] PER STATE REGULATIONS 482.30  THIS CHART WAS REVIEWED FOR MEDICAL NECESSITY WITH RESPECT TO THE PATIENT'S ADMISSION/ DURATION OF STAY.  NEXT REVIEW DATE: 05/12/2017  Willa Rough, RN, BSN CASE MANAGER

## 2017-05-09 NOTE — Progress Notes (Signed)
Adult Psychoeducational Group Note  Date:  05/09/2017 Time:  1:31 AM  Group Topic/Focus:  Wrap-Up Group:   The focus of this group is to help patients review their daily goal of treatment and discuss progress on daily workbooks.  Participation Level:  Minimal  Participation Quality:  Appropriate  Affect:  Appropriate  Cognitive:  Appropriate  Insight: Appropriate, Good and Limited  Engagement in Group:  Limited  Modes of Intervention:  Discussion  Additional Comments: Pt stated her goal for today was to interact more with her peers and not sleep as much. Pt stated she accomplished her goal. Pt rated her over all day a 8 out of 10.  Felipa Furnace 05/09/2017, 1:31 AM

## 2017-05-09 NOTE — Progress Notes (Signed)
Patient ID: Christine Cobb, female   DOB: [birth year], 41 y.o.   MRN: [id number]  DAR: Pt. Denies SI/HI and A/V Hallucinations. She reports that her sleep last night was good, her appetite is fair, energy level is low, and concentration is good. She rates her depression level 0/10, hopelessness level 0/10, and anxiety level 3/10. She reports pain in her lower left jaw this morning and received PRN Ibuprofen. She also reported dry mouth and nicotine craving and received PRN medication for these complaints. Support and encouragement is provided to the patient. Scheduled medications administered to patient per physician's orders. Patient is receptive and cooperative. She is seen in the milieu interacting with some of her peers and is appropriate and cooperative with staff. She reports that she is looking forward to discharge soon. Q15 minute checks are maintained for safety.

## 2017-05-09 NOTE — [facility] Group Notes (Signed)
[facility] LCSW Group Therapy Note  05/09/2017 1:15 to 1:45 PM  Type of Therapy and Topic:  Overcoming Obstacles   Participation Level:  Minimal   Description of Group The main focus of today's process group to discuss what obstacles people may be facing at discharge, both external and internal and use motivational interviewing to discuss possible tools and strategies to use with obstacles.  Summary of Patient Progress: Patient presented with little attention to group process yet willingly answered direct questions. She hopes to return to school and sees her main obstacle as potential relapse. Patient unwilling to process need for supports as she prefers to 'do things on my own.'   Therapeutic molalities: Cognitive Behavioral Therapy Person-Centered Therapy Motivational Interviewing  Therapeutic Goals: 1. Patients will demonstrate understanding of the concept of obstacles 2. Patients will be able to identify pros and cons of their behaviors 3. Patients will be able to identify at least two motivating factors for l of their desire for change   Christine Bern, LCSW

## 2017-05-09 NOTE — [facility] Suicide Risk Assessment (Signed)
[facility] Discharge Suicide Risk Assessment   Principal Problem: Bipolar affective disorder, manic, severe (HCC) Discharge Diagnoses:  Patient Active Problem List   Diagnosis Date Noted  . Bipolar affective disorder, manic, severe (HCC) [F31.13] 04/30/2017  . Severe bipolar affective disorder with psychosis (HCC) [F31.89] 04/29/2017  . Schizoaffective disorder, bipolar type (HCC) [F25.0]   . Cocaine abuse (HCC) [F14.10] 12/31/2014  . Bipolar disorder, current episode manic without psychotic features, severe (HCC) [F31.13]   . Agitation [R45.1]   . Manic behavior (HCC) [F30.10]   . Hyperthyroidism [E05.90] 09/21/2013  . Marijuana abuse [F12.10] 04/12/2013  . History of CHF (congestive heart failure) [Z86.79] 02/01/2013  . Abscess of abdominal wall [L02.211] 10/18/2012  . HTN (hypertension) [I10] 04/08/2011  . Tachycardia [R00.0] 04/08/2011    Total Time spent with patient: 30 minutes  Musculoskeletal: Strength & Muscle Tone: within normal limits Gait & Station: normal Patient leans: N/A  Psychiatric Specialty Exam: Review of Systems  Constitutional: Negative for chills and fever.  Respiratory: Negative for cough and shortness of breath.   Cardiovascular: Negative for chest pain.  Gastrointestinal: Negative for heartburn and nausea.  Psychiatric/Behavioral: Negative for depression, hallucinations and suicidal ideas.    Blood pressure 98/65, pulse (!) 51, temperature 98.3 F (36.8 C), temperature source Oral, resp. rate 20, height 5\' 4"  (1.626 m), weight 63 kg (139 lb), SpO2 100 %.Body mass index is 23.86 kg/m.  General Appearance: Casual and Fairly Groomed  Patent attorney::  Good  Speech:  Clear and Coherent and Normal Rate  Volume:  Normal  Mood:  Euthymic  Affect:  Appropriate and Congruent  Thought Process:  Coherent and Goal Directed  Orientation:  Full (Time, Place, and Person)  Thought Content:  Logical  Suicidal Thoughts:  No  Homicidal Thoughts:  No  Memory:   Immediate;   Good Recent;   Good Remote;   Good  Judgement:  Good  Insight:  Good  Psychomotor Activity:  Normal  Concentration:  Good  Recall:  Fair  Fund of Knowledge:Fair  Language: Fair  Akathisia:  No  Handed:    AIMS (if indicated):     Assets:  Communication Skills Desire for Improvement Social Support Talents/Skills  Sleep:  Number of Hours: 6.25  Cognition: WNL  ADL's:  Intact   Mental Status Per Nursing Assessment::   On Admission:  NA  Demographic Factors:  Low socioeconomic status  Loss Factors: Financial problems/change in socioeconomic status  Historical Factors: Family history of mental illness or substance abuse  Risk Reduction Factors:   Positive social support, Positive therapeutic relationship and Positive coping skills or problem solving skills  Continued Clinical Symptoms:  Bipolar Disorder:   Mixed State  Cognitive Features That Contribute To Risk:  None    Suicide Risk:  Minimal: No identifiable suicidal ideation.  Patients presenting with no risk factors but with morbid ruminations; may be classified as minimal risk based on the severity of the depressive symptoms  Follow-up Information    [facility] Follow up on 05/10/2017.   Why:  Tuesday at 10;00 with your therapist Contact information: [address] [phone]       [facility] Follow up on 05/09/2017.   Why:  Monday at 10:00 with Tresa Endo for your hospital follow up appointment.  then at 11:00 with Tera Partridge information: [address] [place] [address] [phone]          Subjective data: Christine Cobb is a 41 y/o F with history  of bipolar disorder who was admitted with worsening mood symptoms and agitation. She was restarted on home medications and transitioned from zyprexa to abilify. Today upon evaluation, she reports that she is doing well overall. She is sleeping well and her appetite is good. She denies SI/HI/AH/VH. She has been  participating in groups and the therapeutic milieu. She is future oriented about returning to home and her outpatient follow up. She is able to engage in safety planning [facility] if she feels unable to maintain her own safety. She had no further questions, comments, or concerns.  Plan Of Care/Follow-up recommendations:  - Discharge to outpatient level of care - Bipolar I, current episode manic -Continueabilify 10mg  qDay - Continue Remeron 15mg  qhs - Continueatarax 50mg  q6h prn anxiety - Continuetrazodone 100mg  qhs prn insomnia   Activity:  as tolerated Diet:  normal Tests:  NA Other:  see above for DC plan  Micheal Likenshristopher T Julias Mould, MD 05/09/2017, 9:48 AM

## 2017-05-09 NOTE — Progress Notes (Signed)
Patient ID: Christine Cobb, female   DOB: 1976-05-12, 41 y.o.   MRN: [id number]  Discharge Note- Belongings returned to patient at time of discharge. Discharge instructions and medications were reviewed with patient. Patient verbalized understanding of both medications and discharge instructions. Patient is discharged to lobby where her ride was waiting. Q15 minute safety checks were maintained until time of discharge. No distress noted upon discharge.

## 2017-05-09 NOTE — Progress Notes (Addendum)
  [facility] Case Management Discharge Plan :  Will you be returning to the same living situation after discharge:  No. states she is going to live w father of one of her children At discharge, do you have transportation home?: Yes,  famly or can be provided w bus pass Do you have the ability to pay for your medications: Yes,  no concerns expressed  Release of information consent forms completed and in the chart;  Patient's signature needed at discharge.  Patient to Follow up at: Follow-up Information    [facility] Follow up on 05/10/2017.   Why:  Tuesday at 10;00 with your therapist Contact information: [address] [phone]       [facility] Follow up on 05/11/2017.   Why:  Hospital disharge follow up on 11/14 at 8 AM.  Please call to cancel/reschedule if needed.  Contact information: [address] [place] [address] [phone]           Next level of care provider has access to [facility] Link:no  Safety Planning and Suicide Prevention discussed: Yes,  pateint refused collateral contact, SPE discuss w patient  Have you used any form of tobacco in the last 30 days? (Cigarettes, Smokeless Tobacco, Cigars, and/or Pipes): No  Has patient been referred to the Quitline?: N/A patient is not a smoker  Patient has been referred for addiction treatment: Yes referred to outpatient provider for assessment and referral as appropriate  Sallee Lange, LCSW 05/09/2017, 11:18 AM

## 2017-05-09 NOTE — Progress Notes (Signed)
Recreation Therapy Notes  Date: 05/09/17 Time: 1000 Location: [address] Dayroom  Group Topic: Coping Skills  Goal Area(s) Addresses:  Patients will be able to identify positive coping skills. Patients will be able to identify the benefits of using coping skills post d/c.  Behavioral Response: Minimal  Intervention: Worksheet   Activity: Mind map.  LRT and patients filled out the first eight boxes together with friends, anger, anxiety, depression, sadness, racing thoughts, grief and communicate.  Patients were to then come up with three coping skills for each of the situations identified.   Education: Pharmacologist, Building control surveyor.   Education Outcome: Acknowledges understanding/In group clarification offered/Needs additional education.   Clinical Observations/Feedback: Pt was pleasant and engaged when prompted.  Pt identified some of her coping skills as social media for communicate; exercise for anger; meditation for anxiety and therapy for depression.    Caroll Rancher, LRT/CTRS        Caroll Rancher A 05/09/2017 11:42 AM

## 2017-06-18 IMAGING — CR DG ANKLE COMPLETE 3+V*R*
3 series · 3 of 3 positions shown · non-contrast
Comparison: None.

CLINICAL DATA: Right ankle pain

EXAM:
RIGHT ANKLE - COMPLETE 3+ VIEW

[x ankle ap right]
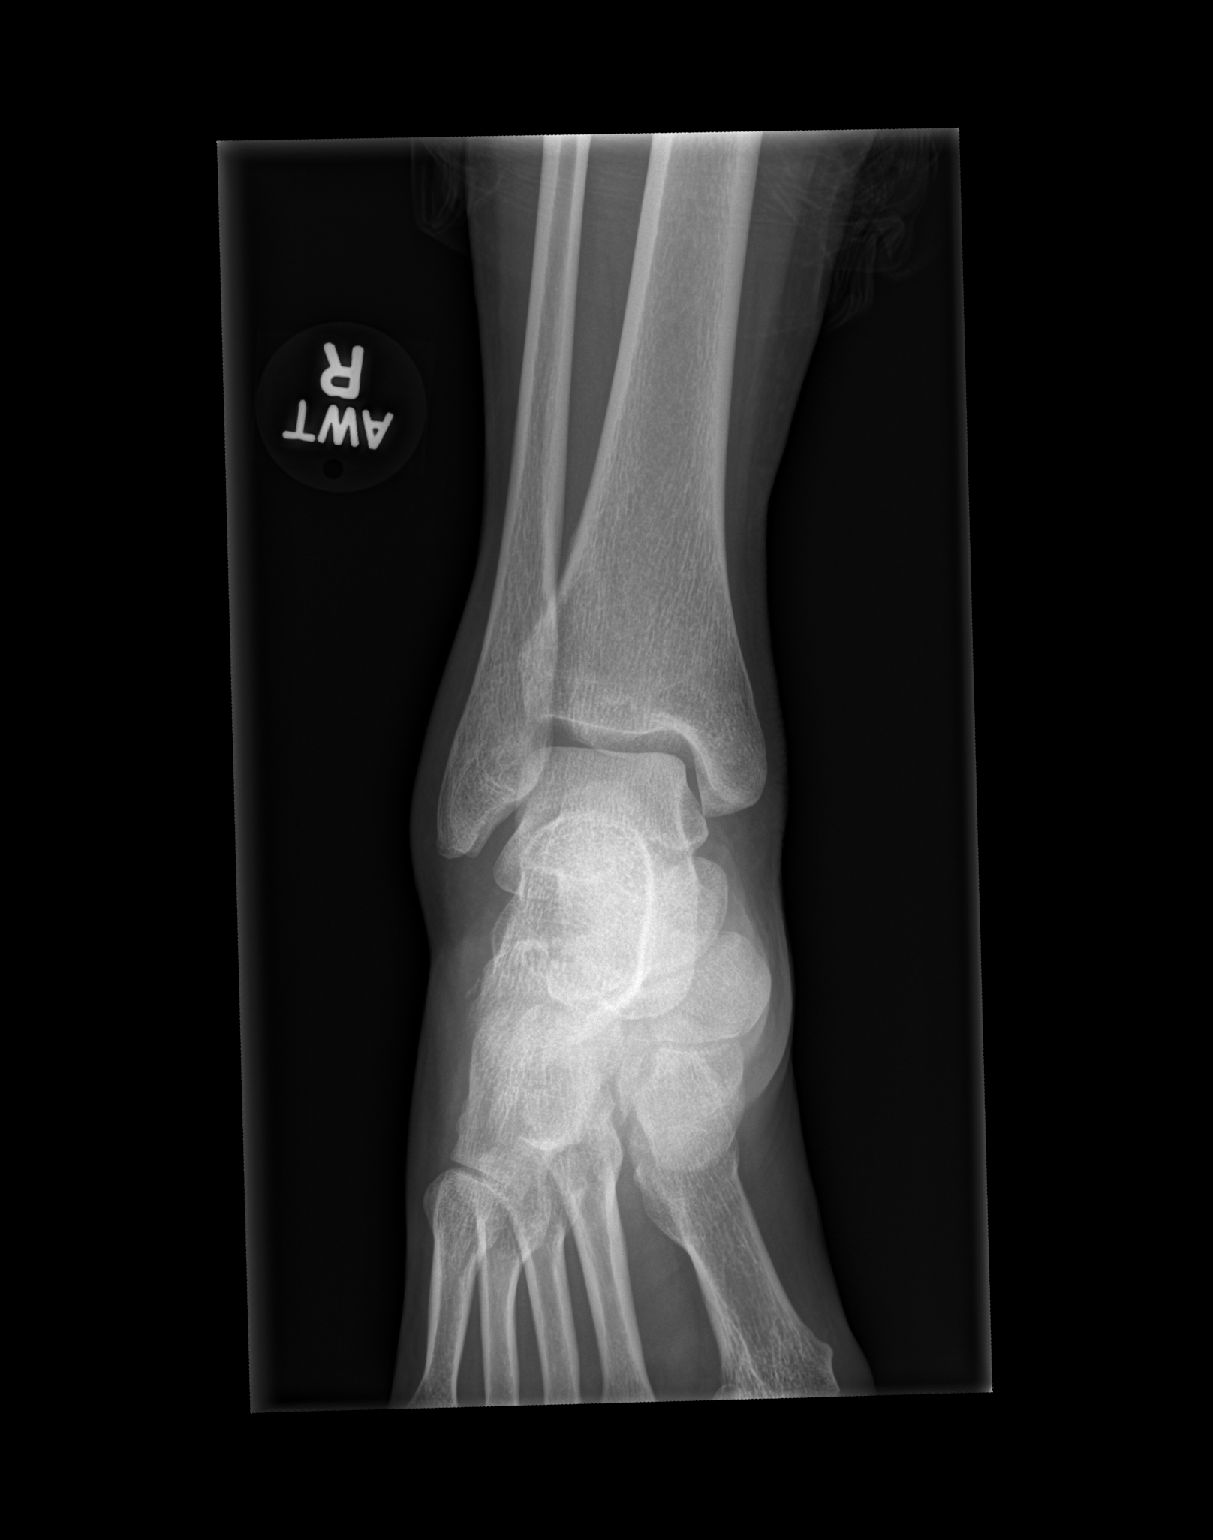

[x ankle obl right]
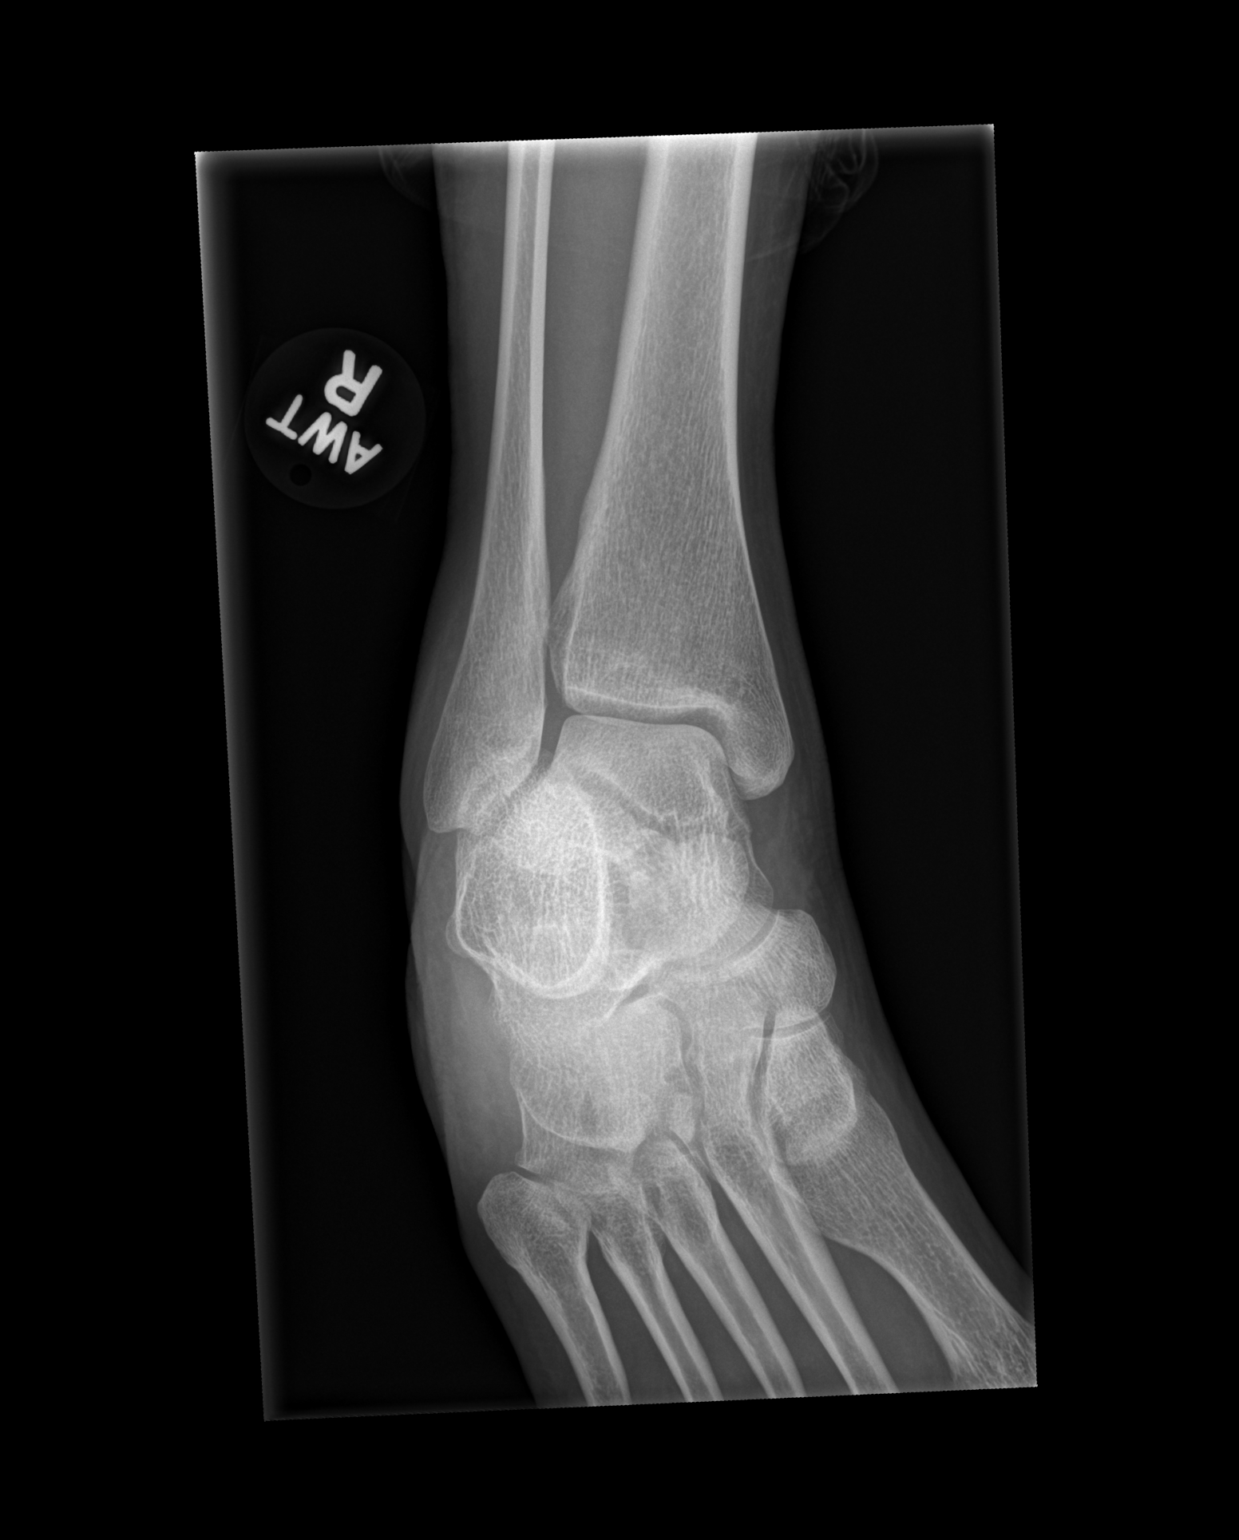

[x ankle lat right]
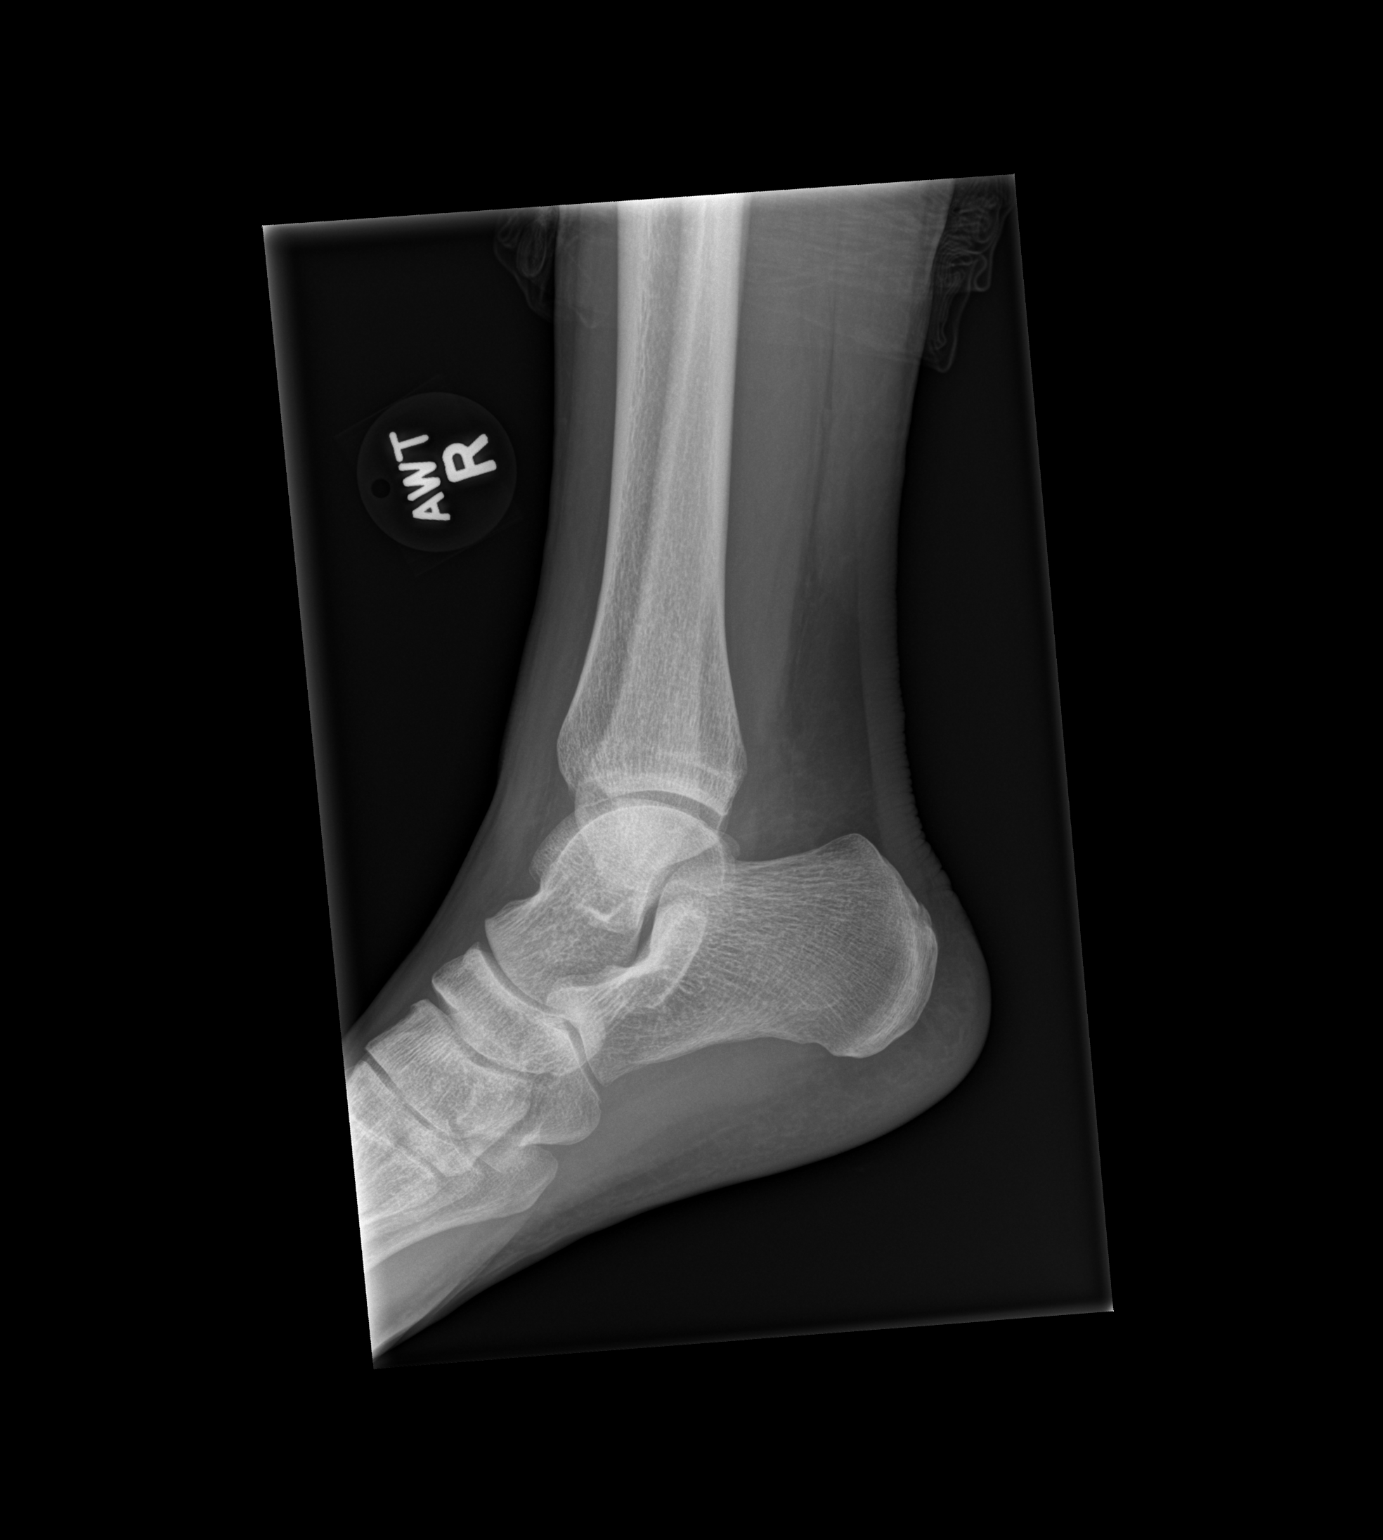

[3 of 3 positions shown; findings below may reference images not displayed]

FINDINGS: There is no evidence of fracture, dislocation, or joint effusion.
There is no evidence of arthropathy or other focal bone abnormality.
Soft tissues are unremarkable.
IMPRESSION: Negative.

## 2017-07-19 ENCOUNTER — Other Ambulatory Visit: Payer: Self-pay

## 2017-07-19 ENCOUNTER — Emergency Department (HOSPITAL_COMMUNITY)
Admission: EM | Admit: 2017-07-19 | Discharge: 2017-07-21 | Disposition: A | Discharge status: Other Acute Inpatient Hospital | Payer: Medicare Other | Attending: Emergency Medicine | Admitting: Emergency Medicine

## 2017-07-19 ENCOUNTER — Encounter (HOSPITAL_COMMUNITY): Payer: Self-pay | Admitting: Nurse Practitioner

## 2017-07-19 DIAGNOSIS — I110 Hypertensive heart disease with heart failure: Secondary | ICD-10-CM | POA: Diagnosis not present

## 2017-07-19 DIAGNOSIS — Z79899 Other long term (current) drug therapy: Secondary | ICD-10-CM | POA: Insufficient documentation

## 2017-07-19 DIAGNOSIS — F17210 Nicotine dependence, cigarettes, uncomplicated: Secondary | ICD-10-CM | POA: Insufficient documentation

## 2017-07-19 DIAGNOSIS — F1410 Cocaine abuse, uncomplicated: Secondary | ICD-10-CM | POA: Diagnosis not present

## 2017-07-19 DIAGNOSIS — F1210 Cannabis abuse, uncomplicated: Secondary | ICD-10-CM | POA: Diagnosis not present

## 2017-07-19 DIAGNOSIS — F3113 Bipolar disorder, current episode manic without psychotic features, severe: Secondary | ICD-10-CM | POA: Diagnosis not present

## 2017-07-19 DIAGNOSIS — I509 Heart failure, unspecified: Secondary | ICD-10-CM | POA: Insufficient documentation

## 2017-07-19 DIAGNOSIS — F329 Major depressive disorder, single episode, unspecified: Secondary | ICD-10-CM | POA: Diagnosis present

## 2017-07-19 LAB — COMPREHENSIVE METABOLIC PANEL
ALK PHOS: 63 U/L (ref 38–126)
ALT: 20 U/L (ref 14–54)
ANION GAP: 11 (ref 5–15)
AST: 40 U/L (ref 15–41)
Albumin: 4.7 g/dL (ref 3.5–5.0)
BUN: 19 mg/dL (ref 6–20)
CALCIUM: 9.7 mg/dL (ref 8.9–10.3)
CHLORIDE: 105 mmol/L (ref 101–111)
CO2: 21 mmol/L — AB (ref 22–32)
Creatinine, Ser: 1.11 mg/dL — ABNORMAL HIGH (ref 0.44–1.00)
GFR calc non Af Amer: >60 mL/min (ref >60)
Glucose, Bld: 90 mg/dL (ref 65–99)
Potassium: 3.8 mmol/L (ref 3.5–5.1)
SODIUM: 137 mmol/L (ref 135–145)
Total Bilirubin: 0.7 mg/dL (ref 0.3–1.2)
Total Protein: 8.5 g/dL — ABNORMAL HIGH (ref 6.5–8.1)

## 2017-07-19 LAB — CBC WITH DIFFERENTIAL/PLATELET
BASOS PCT: 1 %
Basophils Absolute: 0.1 10*3/uL (ref 0.0–0.1)
EOS ABS: 0.1 10*3/uL (ref 0.0–0.7)
Eosinophils Relative: 0 %
HCT: 40.4 % (ref 36.0–46.0)
HEMOGLOBIN: 13.4 g/dL (ref 12.0–15.0)
Lymphocytes Relative: 24 %
Lymphs Abs: 2.7 10*3/uL (ref 0.7–4.0)
MCH: 27.6 pg (ref 26.0–34.0)
MCHC: 33.2 g/dL (ref 30.0–36.0)
MCV: 83.3 fL (ref 78.0–100.0)
Monocytes Absolute: 1.1 10*3/uL — ABNORMAL HIGH (ref 0.1–1.0)
Monocytes Relative: 10 %
NEUTROS PCT: 65 %
Neutro Abs: 7.4 10*3/uL (ref 1.7–7.7)
PLATELETS: 339 10*3/uL (ref 150–400)
RBC: 4.85 MIL/uL (ref 3.87–5.11)
RDW: 14.5 % (ref 11.5–15.5)
WBC: 11.4 10*3/uL — AB (ref 4.0–10.5)

## 2017-07-19 LAB — I-STAT BETA HCG BLOOD, ED (MC, WL, AP ONLY): I-stat hCG, quantitative: <5 m[IU]/mL (ref <5)

## 2017-07-19 LAB — ACETAMINOPHEN LEVEL

## 2017-07-19 LAB — ETHANOL: Alcohol, Ethyl (B): <10 mg/dL (ref <10)

## 2017-07-19 LAB — SALICYLATE LEVEL

## 2017-07-19 MED ORDER — AMANTADINE HCL 100 MG PO CAPS
100.0000 mg | ORAL_CAPSULE | Freq: Two times a day (BID) | ORAL | Status: DC
  Administered 2017-07-20 – 2017-07-21 (×2): 100 mg via ORAL
  Filled 2017-07-19 (×2): qty 1

## 2017-07-19 MED ORDER — MIRTAZAPINE 30 MG PO TABS
15.0000 mg | ORAL_TABLET | Freq: Every day | ORAL | Status: DC
  Administered 2017-07-20: 15 mg via ORAL
  Filled 2017-07-19: qty 1

## 2017-07-19 MED ORDER — ZIPRASIDONE MESYLATE 20 MG IM SOLR
20.0000 mg | Freq: Once | INTRAMUSCULAR | Status: AC
  Administered 2017-07-19: 20 mg via INTRAMUSCULAR
  Filled 2017-07-19: qty 20

## 2017-07-19 MED ORDER — ARIPIPRAZOLE 10 MG PO TABS
10.0000 mg | ORAL_TABLET | Freq: Every day | ORAL | Status: DC
  Administered 2017-07-21: 10 mg via ORAL
  Filled 2017-07-19: qty 1

## 2017-07-19 MED ORDER — TRIAMTERENE-HCTZ 37.5-25 MG PO TABS
1.0000 | ORAL_TABLET | Freq: Every day | ORAL | Status: DC
  Administered 2017-07-21: 1 via ORAL
  Filled 2017-07-19 (×3): qty 1

## 2017-07-19 MED ORDER — HYDROXYZINE HCL 25 MG PO TABS
50.0000 mg | ORAL_TABLET | Freq: Three times a day (TID) | ORAL | Status: DC | PRN
  Administered 2017-07-20: 50 mg via ORAL
  Filled 2017-07-19: qty 2

## 2017-07-19 MED ORDER — LORAZEPAM 2 MG/ML IJ SOLN
2.0000 mg | Freq: Once | INTRAMUSCULAR | Status: AC
  Administered 2017-07-19: 2 mg via INTRAMUSCULAR
  Filled 2017-07-19: qty 1

## 2017-07-19 MED ORDER — NICOTINE POLACRILEX 2 MG MT GUM: 2.0000 mg | CHEWING_GUM | OROMUCOSAL | Status: DC | PRN

## 2017-07-19 NOTE — ED Provider Notes (Addendum)
[facility]-EMERGENCY DEPT Provider Note   CSN: [id number] Arrival date & time: 07/19/17  1301     History   Chief Complaint No chief complaint on file.   HPI Christine Cobb is a 42 y.o. female.  42 year old female with history of bipolar disorder presents with acute psychosis.  Patient was found in the street wandering without her clothes on.  I have seen this patient before in the past for similar behavior.  Patient is not cooperating with the assessment.  Review of the old chart shows that patient had her medications adjusted recently.  It is unclear if she has been compliant.  No further history obtainable.      Past Medical History:  Diagnosis Date  . Arm pain   . Bipolar affective disorder, currently manic, mild (HCC)   . CHF (congestive heart failure) (HCC)   . Eye globe prosthesis   . HTN (hypertension)   . Hyperthyroidism   . Sinus tachycardia     Patient Active Problem List   Diagnosis Date Noted  . Bipolar affective disorder, manic, severe (HCC) 04/30/2017  . Severe bipolar affective disorder with psychosis (HCC) 04/29/2017  . Schizoaffective disorder, bipolar type (HCC)   . Cocaine abuse (HCC) 12/31/2014  . Bipolar disorder, current episode manic without psychotic features, severe (HCC)   . Agitation   . Manic behavior (HCC)   . Hyperthyroidism 09/21/2013  . Marijuana abuse 04/12/2013  . History of CHF (congestive heart failure) 02/01/2013  . Abscess of abdominal wall 10/18/2012  . HTN (hypertension) 04/08/2011  . Tachycardia 04/08/2011    Past Surgical History:  Procedure Laterality Date  . DILATION AND CURETTAGE OF UTERUS    . EYE SURGERY      OB History    Gravida Para Term Preterm AB Living   3 2 2   1 2    SAB TAB Ectopic Multiple Live Births   1 0     2       Home Medications    Prior to Admission medications   Medication Sig Start Date End Date Taking? Authorizing Provider  amantadine (SYMMETREL) 100  MG capsule Take 1 capsule (100 mg total) 2 (two) times daily by mouth. 05/09/17   Oneta Rack, NP  ARIPiprazole (ABILIFY) 10 MG tablet Take 1 tablet (10 mg total) daily by mouth. 05/10/17   Oneta Rack, NP  hydrOXYzine (ATARAX/VISTARIL) 50 MG tablet Take 1 tablet (50 mg total) 3 (three) times daily as needed by mouth for anxiety. 05/09/17   Oneta Rack, NP  mirtazapine (REMERON) 15 MG tablet Take 1 tablet (15 mg total) at bedtime by mouth. 05/09/17   Oneta Rack, NP  nicotine polacrilex (NICORETTE) 2 MG gum Take 1 each (2 mg total) as needed by mouth for smoking cessation. 05/09/17   Oneta Rack, NP  triamterene-hydrochlorothiazide (MAXZIDE-25) 37.5-25 MG tablet Take 1 tablet daily by mouth. 05/10/17   Oneta Rack, NP    Family History Family History  Problem Relation Age of Onset  . Hypertension Other   . Emphysema Other   . Asthma Son   . Diabetes Maternal Uncle   . Diabetes Paternal Grandmother     Social History Social History   Tobacco Use  . Smoking status: Current Every Day Smoker    Types: Cigarettes    Last attempt to quit: 08/21/2013    Years since quitting: 3.9  . Smokeless tobacco: Never Used  Substance Use Topics  .  Alcohol use: Yes    Alcohol/week: 0.0 oz    Comment: Last drink: 1/2 beer PTA  . Drug use: Yes    Types: Marijuana    Comment: Pt sts "anything and everything"     Allergies   Amoxil [amoxicillin]; Ketorolac tromethamine; Prenatal [b-plex plus]; Tegretol [carbamazepine]; B-plex plus; and Tegretol [carbamazepine]   Review of Systems Review of Systems  Unable to perform ROS: Psychiatric disorder     Physical Exam Updated Vital Signs There were no vitals taken for this visit.  Physical Exam  Constitutional: She is oriented to person, place, and time. She appears well-developed and well-nourished.  Non-toxic appearance. No distress.  HENT:  Head: Normocephalic and atraumatic.  Eyes: Conjunctivae, EOM and lids are  normal. Pupils are equal, round, and reactive to light.  Neck: Normal range of motion. Neck supple. No tracheal deviation present. No thyroid mass present.  Cardiovascular: Normal rate, regular rhythm and normal heart sounds. Exam reveals no gallop.  No murmur heard. Pulmonary/Chest: Effort normal and breath sounds normal. No stridor. No respiratory distress. She has no decreased breath sounds. She has no wheezes. She has no rhonchi. She has no rales.  Abdominal: Soft. Normal appearance and bowel sounds are normal. She exhibits no distension. There is no tenderness. There is no rebound and no CVA tenderness.  Musculoskeletal: Normal range of motion. She exhibits no edema or tenderness.  Neurological: She is alert and oriented to person, place, and time. She has normal strength. No cranial nerve deficit or sensory deficit. GCS eye subscore is 4. GCS verbal subscore is 5. GCS motor subscore is 6.  Skin: Skin is warm and dry. No abrasion and no rash noted.  Psychiatric: Her affect is inappropriate. Her speech is rapid and/or pressured. She is aggressive, hyperactive and combative. Thought content is delusional. She expresses impulsivity and inappropriate judgment. She is inattentive.  Nursing note and vitals reviewed.    ED Treatments / Results  Labs (all labs ordered are listed, but only abnormal results are displayed) Labs Reviewed - No data to display  EKG  EKG Interpretation None       Radiology No results found.  Procedures Procedures (including critical care time)  Medications Ordered in ED Medications  ziprasidone (GEODON) injection 20 mg (not administered)     Initial Impression / Assessment and Plan / ED Course  I have reviewed the triage vital signs and the nursing notes.  Pertinent labs & imaging results that were available during my care of the patient were reviewed by me and considered in my medical decision making (see chart for details).     Patient medicated  with Geodon here.  Medical clearance labs are pending at this time and once resulted will consult psychiatry for disposition  3:41 PM Patient medically cleared for psychiatric disposition.  Did require an additional dose of Ativan 2 mg IM Final Clinical Impressions(s) / ED Diagnoses   Final diagnoses:  None    ED Discharge Orders    None       Lorre Nick, MD 07/19/17 1405    Lorre Nick, MD 07/19/17 [phone]

## 2017-07-19 NOTE — BH Assessment (Signed)
[facility] Assessment Progress Note  Clinician made attempt to assess pt, but she had been sedated due to her extreme manic behaviors and was currently sleeping.   Johny Shock. Ladona Ridgel, MS, NCC, LPCA Counselor

## 2017-07-19 NOTE — ED Triage Notes (Signed)
Patient was at a gas station which they called [employer] due to the patient acting up. Patient was striping her clothes off and wished to go to the hospital. Patient is manic and loud aggressive verbally and singing.

## 2017-07-20 ENCOUNTER — Other Ambulatory Visit: Payer: Self-pay

## 2017-07-20 LAB — RAPID URINE DRUG SCREEN, HOSP PERFORMED
Amphetamines: NOT DETECTED
Barbiturates: NOT DETECTED
Benzodiazepines: POSITIVE — AB
Cocaine: POSITIVE — AB
Opiates: NOT DETECTED
Tetrahydrocannabinol: POSITIVE — AB

## 2017-07-20 MED ORDER — LORAZEPAM 2 MG/ML IJ SOLN: 2.0000 mg | Freq: Once | INTRAMUSCULAR | Status: DC

## 2017-07-20 MED ORDER — ZIPRASIDONE MESYLATE 20 MG IM SOLR
20.0000 mg | Freq: Once | INTRAMUSCULAR | Status: AC
  Administered 2017-07-20: 20 mg via INTRAMUSCULAR
  Filled 2017-07-20: qty 20

## 2017-07-20 MED ORDER — DIPHENHYDRAMINE HCL 50 MG/ML IJ SOLN
50.0000 mg | Freq: Once | INTRAMUSCULAR | Status: AC
  Administered 2017-07-20: 50 mg via INTRAMUSCULAR
  Filled 2017-07-20: qty 1

## 2017-07-20 MED ORDER — LORAZEPAM 2 MG/ML IJ SOLN
2.0000 mg | Freq: Once | INTRAMUSCULAR | Status: AC
  Administered 2017-07-20: 2 mg via INTRAMUSCULAR
  Filled 2017-07-20: qty 1

## 2017-07-20 MED ORDER — STERILE WATER FOR INJECTION IJ SOLN
INTRAMUSCULAR | Status: AC
  Administered 2017-07-20: 1.2 mL
  Filled 2017-07-20: qty 10

## 2017-07-20 MED ORDER — STERILE WATER FOR INJECTION IJ SOLN
INTRAMUSCULAR | Status: AC
  Administered 2017-07-20: 16:00:00
  Filled 2017-07-20: qty 10

## 2017-07-20 MED ORDER — HALOPERIDOL LACTATE 5 MG/ML IJ SOLN
5.0000 mg | Freq: Once | INTRAMUSCULAR | Status: AC
  Administered 2017-07-20: 5 mg via INTRAMUSCULAR
  Filled 2017-07-20: qty 1

## 2017-07-20 MED ORDER — NICOTINE POLACRILEX 2 MG MT GUM
2.0000 mg | CHEWING_GUM | OROMUCOSAL | Status: DC | PRN
  Administered 2017-07-20 – 2017-07-21 (×5): 2 mg via ORAL
  Filled 2017-07-20 (×6): qty 1

## 2017-07-20 MED ORDER — NICOTINE 21 MG/24HR TD PT24: 21.0000 mg | MEDICATED_PATCH | Freq: Every day | TRANSDERMAL | Status: DC

## 2017-07-20 NOTE — ED Notes (Signed)
Patient took her IM medications without a struggle.  Security at bedside as backup.

## 2017-07-20 NOTE — ED Notes (Signed)
Pt refusing vs at this time

## 2017-07-20 NOTE — BH Assessment (Signed)
[facility] Assessment Progress Note  Per Juanetta Beets, DO, this pt requires psychiatric hospitalization at this time.  Pt presents under IVC initiated by EDP Alvira Monday, MD.  The following facilities have been contacted to seek placement for this pt, with results as noted:  Beds available, information sent, decision pending:  [facility]   At capacity:  [facility] [place] [id number] Health Coordinator [phone]

## 2017-07-20 NOTE — ED Notes (Signed)
Patient took IM injections without any resistance.  Security at bedside with nurse.

## 2017-07-20 NOTE — Progress Notes (Signed)
CSW received a call from [place] from [facility].  Victorino Dike states that at this time with the pt's current behavior patterns, ie, going into other patient's rooms etc the pt would not be a good fit with [facility]'s current milieu.  Victorino Dike also says but that if documentation occurs that can be faxed to [phone], showing pt's behaviors have changed for at least 12 hours, then please call [facility] back at [phone] and the MD will re-assess the pt.  CSW will update SAPU RN.  5:01 PM CSW updated pt's RN.  RN aware.  Of note: [facility] also called and stated pt is too acute right now for admission.  Please reconsult if future social work needs arise.  CSW signing off, as social work intervention is no longer needed.  Dorothe Pea. Lecretia Buczek, LCSW, LCAS, CSI Clinical Social Worker Ph: [phone]

## 2017-07-20 NOTE — ED Notes (Signed)
Patient resting quietly with eyes closed. Respirations equal and unlabored. Skin warm and dry. NAD. Will continue to monitor patient. Q 15 min safety checks and video monitoring.

## 2017-07-20 NOTE — ED Notes (Signed)
Pt behavior bizarre, loud, verbally aggressive, verbally threatening, disrobing, This nurse notified EDP of pt behavior.

## 2017-07-20 NOTE — ED Notes (Signed)
Pt continues to pull call bells on the unit.

## 2017-07-20 NOTE — ED Notes (Signed)
Patient continues to go in patient rooms and refuses to come out. Patient continues to verbally threatened the staff. Staff unable to re-direct patient at this time. Orders obtained for seclusion from Dr. Rhunette Croft. Orders read back and verified. Patient will be placed in seclusion room.

## 2017-07-20 NOTE — ED Notes (Signed)
Patient released from from seclusion room to tx area 40. Patient drowsy and refuses vital signs at this time. Patient put her finger in her rectum and pulled out stool and threw on floor while she was in seclusion room. Respirations equal and unlabored, skin warm and dry. NAD. Will continue to monitor patient. Q 15 min safety checks remain in place and video monitoring.

## 2017-07-20 NOTE — ED Notes (Signed)
Pt intrusive, wondering into other patient rooms, verbally aggressive, verbally threatening, requiring frequent redirection. Special checks q 15 mins in place for safety, Video monitoring in place. Will continue to monitor.

## 2017-07-20 NOTE — ED Notes (Signed)
Pt took a shower in BH1, pt trashed the bathroom, pulled call bell in bathroom and then came out of bathroom naked, pt verbally aggressive, verbally threatening. Pt then went into BH2 and pulled call bell, pt had defecated in floor, pt verbally aggressive "now bitch you clean that up." Pt behavior impossible to redirect. Security and [employer] present.

## 2017-07-20 NOTE — ED Notes (Signed)
Pt up at nurses station, verbally threatening "I'm going to come in this window and yank your head up."

## 2017-07-20 NOTE — ED Notes (Signed)
Patient arrived on unit cursing at staff "Bitch I will kick your ass". Patient going in and out of patient rooms and refuses to come when asked by staff. Patient is loud and verbally abusive and threatening physical harm to staff. Donell Sievert, PA contacted for medication order Ativan 2 mg IM x 1 and Benadryl 50 mg IM x 1. Orders read back and verified.

## 2017-07-20 NOTE — ED Triage Notes (Signed)
Pt threatened to slap this Clinical research associate for no reason and have been verbally aggressive all day. Pt has been spitting on the glass at this writer while communicating more threats. Nurse notified

## 2017-07-20 NOTE — ED Notes (Signed)
Pt awake, loud, verbally threatening, verbally aggressive, argumentative, disrobing, unable to follow simple direction. This nurse notified LaurIe,NP.

## 2017-07-20 NOTE — ED Notes (Signed)
Notified Rashell, RN in [place] about patient coming to SAPU. Rashell stated to have patient wanded by security and brought to room 40.

## 2017-07-20 NOTE — ED Notes (Signed)
Patient is calm, pleasant and cooperative at this time. Patient apologizes for her behavior this morning. Snack given and patient took all of her PO medications. Encouragement and support provided and safety maintain. Q 15 min safety checks remain in place and video monitoring.

## 2017-07-20 NOTE — BH Assessment (Addendum)
Assessment Note  Christine Cobb is a 42 y.o. female, BIB [employer] due to being found at a gas station stripping off her clothes and acting bizarre. Pt is extremely manic and a poor historian. She is unable to give an adequate assessment. She has flight of ideas and her thought content is nonsensical. Pt has a long hx of similar behaviors in the past, most recently in 04/2017.   Diagnosis: Bipolar d/o, current or most recent episode manic  Past Medical History:  Past Medical History:  Diagnosis Date  . Arm pain   . Bipolar affective disorder, currently manic, mild (HCC)   . CHF (congestive heart failure) (HCC)   . Eye globe prosthesis   . HTN (hypertension)   . Hyperthyroidism   . Sinus tachycardia     Past Surgical History:  Procedure Laterality Date  . DILATION AND CURETTAGE OF UTERUS    . EYE SURGERY      Family History:  Family History  Problem Relation Age of Onset  . Hypertension Other   . Emphysema Other   . Asthma Son   . Diabetes Maternal Uncle   . Diabetes Paternal Grandmother     Social History:  reports that she has been smoking cigarettes.  she has never used smokeless tobacco. She reports that she drinks alcohol. She reports that she uses drugs. Drug: Marijuana.  Additional Social History:  Alcohol / Drug Use Pain Medications: see PTA meds Prescriptions: see PTA meds Over the Counter: see PTA meds History of alcohol / drug use?: No history of alcohol / drug abuse  CIWA: CIWA-Ar BP: 133/80 Pulse Rate: (!) 105 COWS:    Allergies:  Allergies  Allergen Reactions  . Amoxil [Amoxicillin] Anaphylaxis  . Ketorolac Tromethamine Hives and Swelling  . Prenatal [B-Plex Plus] Nausea Only  . Tegretol [Carbamazepine] Other (See Comments)    Swelling, skin peeling, skin discoloration  . B-Plex Plus Nausea Only    Prenatal   . Tegretol [Carbamazepine] Swelling and Other (See Comments)    Skin peeling Skin discoloring     Home Medications:  (Not in a  hospital admission)  OB/GYN Status:  No LMP recorded.  General Assessment Data Assessment unable to be completed: Yes Reason for not completing assessment: Pt is sedated and sleeping. [facility] Assessment: [facility] TTS Assessment: In system Is this a Tele or Face-to-Face Assessment?: Face-to-Face Is this an Initial Assessment or a Re-assessment for this encounter?: Initial Assessment Marital status: Married(unsure if this is true) Is patient pregnant?: No Pregnancy Status: No Living Arrangements: Alone Can pt return to current living arrangement?: Yes Admission Status: Voluntary Is patient capable of signing voluntary admission?: No Referral Source: Self/Family/Friend Insurance type: Medicare     Crisis Care Plan Living Arrangements: Alone Name of Psychiatrist: unknown Name of Therapist: unknown  Education Status Is patient currently in school?: No  Risk to self with the past 6 months Suicidal Ideation: No Has patient been a risk to self within the past 6 months prior to admission? : No Suicidal Intent: No Has patient had any suicidal intent within the past 6 months prior to admission? : No Is patient at risk for suicide?: No Suicidal Plan?: No Has patient had any suicidal plan within the past 6 months prior to admission? : No Access to Means: No Previous Attempts/Gestures: No Intentional Self Injurious Behavior: None Family Suicide History: Unknown Substance abuse history and/or treatment for substance abuse?: Yes Suicide prevention information given to non-admitted patients: Not applicable  Risk to Others within the past 6 months Homicidal Ideation: No Does patient have any lifetime risk of violence toward others beyond the six months prior to admission? : Unknown Thoughts of Harm to Others: No Current Homicidal Intent: No Current Homicidal Plan: No Access to Homicidal Means: No Assessment of Violence: None Noted Does patient have access to weapons?: No Is  patient on probation?: Unknown  Psychosis Hallucinations: None noted Delusions: Unspecified  Mental Status Report Appearance/Hygiene: Disheveled, In scrubs Eye Contact: Good Motor Activity: Hyperactivity Speech: Loud, Rapid, Pressured Level of Consciousness: Alert, Irritable Mood: Labile Affect: Appropriate to circumstance Anxiety Level: Moderate Thought Processes: Irrelevant, Flight of Ideas Judgement: Impaired Orientation: Person Obsessive Compulsive Thoughts/Behaviors: Unable to Assess  Cognitive Functioning Concentration: Decreased Memory: Unable to Assess IQ: Average Insight: Poor Impulse Control: Poor Appetite: Good Sleep: No Change Vegetative Symptoms: None  ADLScreening [facility] Assessment Services) Patient's cognitive ability adequate to safely complete daily activities?: Yes Patient able to express need for assistance with ADLs?: Yes Independently performs ADLs?: Yes (appropriate for developmental age)  Prior Inpatient Therapy Prior Inpatient Therapy: Yes Prior Therapy Dates: multiple Prior Therapy Facilty/Provider(s): multiple Reason for Treatment: mania  Prior Outpatient Therapy Prior Outpatient Therapy: Yes Does patient have an ACCT team?: Unknown Does patient have Intensive In-House Services?  : No Does patient have [facility] services? : Unknown Does patient have P4CC services?: No  ADL Screening (condition at time of admission) Patient's cognitive ability adequate to safely complete daily activities?: Yes Is the patient deaf or have difficulty hearing?: No Does the patient have difficulty seeing, even when wearing glasses/contacts?: No Does the patient have difficulty concentrating, remembering, or making decisions?: Yes Patient able to express need for assistance with ADLs?: Yes Does the patient have difficulty dressing or bathing?: No Independently performs ADLs?: Yes (appropriate for developmental age) Does the patient have difficulty walking or  climbing stairs?: No Weakness of Legs: None Weakness of Arms/Hands: None  Home Assistive Devices/Equipment Home Assistive Devices/Equipment: None    Abuse/Neglect Assessment (Assessment to be complete while patient is alone) Abuse/Neglect Assessment Can Be Completed: Unable to assess, patient is non-responsive or altered mental status     Advance Directives (For Healthcare) Does Patient Have a Medical Advance Directive?: No Would patient like information on creating a medical advance directive?: No - Patient declined Nutrition Screen- [facility] Patient's home diet: Regular Has the patient recently lost weight without trying?: No Has the patient been eating poorly because of a decreased appetite?: No Malnutrition Screening Tool Score: 0  Additional Information 1:1 In Past 12 Months?: No CIRT Risk: Yes Elopement Risk: No Does patient have medical clearance?: Yes     Disposition:  Disposition Initial Assessment Completed for this Encounter: Yes(consulted with Elta Guadeloupe, NP) Disposition of Patient: Inpatient treatment program Type of inpatient treatment program: Adult  On Site Evaluation by:   Reviewed with Physician:    Laddie Aquas 07/20/2017 8:46 AM

## 2017-07-20 NOTE — ED Notes (Signed)
Patient woke up very agitated & manic. Called MD. Placed order for 20mg  geodan and given in right deltoid.

## 2017-07-21 DIAGNOSIS — F1410 Cocaine abuse, uncomplicated: Secondary | ICD-10-CM | POA: Diagnosis not present

## 2017-07-21 DIAGNOSIS — R4587 Impulsiveness: Secondary | ICD-10-CM | POA: Diagnosis not present

## 2017-07-21 DIAGNOSIS — R450 Nervousness: Secondary | ICD-10-CM

## 2017-07-21 DIAGNOSIS — F419 Anxiety disorder, unspecified: Secondary | ICD-10-CM

## 2017-07-21 DIAGNOSIS — F3113 Bipolar disorder, current episode manic without psychotic features, severe: Secondary | ICD-10-CM | POA: Diagnosis not present

## 2017-07-21 DIAGNOSIS — F1210 Cannabis abuse, uncomplicated: Secondary | ICD-10-CM | POA: Diagnosis not present

## 2017-07-21 DIAGNOSIS — F17210 Nicotine dependence, cigarettes, uncomplicated: Secondary | ICD-10-CM

## 2017-07-21 MED ORDER — DIPHENHYDRAMINE HCL 50 MG/ML IJ SOLN
50.0000 mg | Freq: Once | INTRAMUSCULAR | Status: AC
  Administered 2017-07-21: 50 mg via INTRAMUSCULAR
  Filled 2017-07-21: qty 1

## 2017-07-21 MED ORDER — DIPHENHYDRAMINE HCL 50 MG/ML IJ SOLN: 50.0000 mg | Freq: Once | INTRAMUSCULAR | Status: DC

## 2017-07-21 MED ORDER — ZIPRASIDONE MESYLATE 20 MG IM SOLR: 20.0000 mg | Freq: Once | INTRAMUSCULAR | Status: DC

## 2017-07-21 MED ORDER — LORAZEPAM 2 MG/ML IJ SOLN: 2.0000 mg | Freq: Once | INTRAMUSCULAR | Status: DC

## 2017-07-21 MED ORDER — STERILE WATER FOR INJECTION IJ SOLN
INTRAMUSCULAR | Status: AC
  Administered 2017-07-21: 12:00:00
  Filled 2017-07-21: qty 10

## 2017-07-21 MED ORDER — LORAZEPAM 1 MG PO TABS: 2.0000 mg | ORAL_TABLET | Freq: Every day | ORAL | Status: DC

## 2017-07-21 MED ORDER — LORAZEPAM 2 MG/ML IJ SOLN
2.0000 mg | Freq: Once | INTRAMUSCULAR | Status: AC
  Administered 2017-07-21: 2 mg via INTRAMUSCULAR
  Filled 2017-07-21: qty 1

## 2017-07-21 MED ORDER — ZIPRASIDONE MESYLATE 20 MG IM SOLR
20.0000 mg | Freq: Once | INTRAMUSCULAR | Status: AC
  Administered 2017-07-21: 20 mg via INTRAMUSCULAR
  Filled 2017-07-21: qty 20

## 2017-07-21 NOTE — ED Notes (Signed)
Patient is resting comfortably. 

## 2017-07-21 NOTE — ED Notes (Signed)
Sheriff called for transport, reports will call when on the way.

## 2017-07-21 NOTE — ED Notes (Signed)
Pt behavior becoming increasingly agitated, yelling, verbally aggressive, verbally threatening nursing staff. Pt disrobing in the hallway, impossible to redirect, speech pressured, tangential.  Dr.Norman notified . Awaiting orders.

## 2017-07-21 NOTE — ED Notes (Signed)
Pt up at nurses station, apologectic reports "I'm not mean, I just lost a baby." Pt speech rapid pressured, pt then begins to sing the national anthem. Pt behavior redirectable, Encourgement and support provided. Special checks q 15 mins in place for safety, Video monitoring in place. Will continue to monitor.

## 2017-07-21 NOTE — ED Notes (Signed)
Patient is calm and cooperative thru out shift.

## 2017-07-21 NOTE — BH Assessment (Signed)
[facility] Assessment Progress Note  Per Juanetta Beets, DO, this pt continues to require psychiatric hospitalization at this time.  Pt presents under IVC initiated by EDP Alvira Monday, MD.  At 15:16 Darlene calls from [facility] to report that pt has been accepted to their facility by Dr Lavell Luster to [id number] [id number].  Nanine Means, DNP concurs with this decision.  Pt's nurse, Morrie Sheldon, has been notified, and agrees to call report to [phone]; from there she will ask for the nurse covering the aforementioned room.  Pt is to be transported via [facility].  Doylene Canning Behavioral Health Coordinator [phone]

## 2017-07-21 NOTE — ED Notes (Signed)
Pt. Transferred to [facility] via [facility].  Pt. Ambulatory with steady gait, no distress noted.

## 2017-07-21 NOTE — Consult Note (Signed)
[facility] Face-to-Face Psychiatry Consult   Reason for Consult:  Mania and cocaine abuse Referring Physician:  EDP Patient Identification: Christine Cobb MRN:  [id number] Principal Diagnosis: Bipolar disorder, current episode manic without psychotic features, severe (HCC) Diagnosis:   Patient Active Problem List   Diagnosis Date Noted  . Cocaine abuse (HCC) [F14.10] 12/31/2014    Priority: High  . Bipolar disorder, current episode manic without psychotic features, severe (HCC) [F31.13]     Priority: High  . Bipolar affective disorder, manic, severe (HCC) [F31.13] 04/30/2017  . Severe bipolar affective disorder with psychosis (HCC) [F31.89] 04/29/2017  . Schizoaffective disorder, bipolar type (HCC) [F25.0]   . Agitation [R45.1]   . Manic behavior (HCC) [F30.10]   . Hyperthyroidism [E05.90] 09/21/2013  . Marijuana abuse [F12.10] 04/12/2013  . History of CHF (congestive heart failure) [Z86.79] 02/01/2013  . Abscess of abdominal wall [L02.211] 10/18/2012  . HTN (hypertension) [I10] 04/08/2011  . Tachycardia [R00.0] 04/08/2011    Total Time spent with patient: 45 minutes  Subjective:   Christine Cobb is a 42 y.o. female patient admitted with mania.  HPI:  42 yo female who presented with mania and cocaine abuse.  She is pleasantly manic with singing in the ED.  Lifts her shirt and displays her chest at times but redirectable.  Started undressing and activity increasing, IM medications given and she is currently sleeping.    Past Psychiatric History: bipolar disorder, substance abuse  Risk to Self: Suicidal Ideation: No Suicidal Intent: No Is patient at risk for suicide?: No Suicidal Plan?: No Access to Means: No Intentional Self Injurious Behavior: None Risk to Others: Homicidal Ideation: No Thoughts of Harm to Others: No Current Homicidal Intent: No Current Homicidal Plan: No Access to Homicidal Means: No Assessment of Violence: None Noted Does patient have access  to weapons?: No Prior Inpatient Therapy: Prior Inpatient Therapy: Yes Prior Therapy Dates: multiple Prior Therapy Facilty/Provider(s): multiple Reason for Treatment: mania Prior Outpatient Therapy: Prior Outpatient Therapy: Yes Does patient have an ACCT team?: Unknown Does patient have Intensive In-House Services?  : No Does patient have [facility] services? : Unknown Does patient have P4CC services?: No  Past Medical History:  Past Medical History:  Diagnosis Date  . Arm pain   . Bipolar affective disorder, currently manic, mild (HCC)   . CHF (congestive heart failure) (HCC)   . Eye globe prosthesis   . HTN (hypertension)   . Hyperthyroidism   . Sinus tachycardia     Past Surgical History:  Procedure Laterality Date  . DILATION AND CURETTAGE OF UTERUS    . EYE SURGERY     Family History:  Family History  Problem Relation Age of Onset  . Hypertension Other   . Emphysema Other   . Asthma Son   . Diabetes Maternal Uncle   . Diabetes Paternal Grandmother    Family Psychiatric  History: none Social History:  Social History   Substance and Sexual Activity  Alcohol Use Yes  . Alcohol/week: 0.0 oz   Comment: Last drink: 1/2 beer PTA     Social History   Substance and Sexual Activity  Drug Use Yes  . Types: Marijuana   Comment: Pt sts "anything and everything"    Social History   Socioeconomic History  . Marital status: Married    Spouse name: None  . Number of children: None  . Years of education: None  . Highest education level: None  Social Needs  . Financial resource strain: None  .  Food insecurity - worry: None  . Food insecurity - inability: None  . Transportation needs - medical: None  . Transportation needs - non-medical: None  Occupational History  . None  Tobacco Use  . Smoking status: Current Every Day Smoker    Types: Cigarettes    Last attempt to quit: 08/21/2013    Years since quitting: 3.9  . Smokeless tobacco: Never Used  Substance and  Sexual Activity  . Alcohol use: Yes    Alcohol/week: 0.0 oz    Comment: Last drink: 1/2 beer PTA  . Drug use: Yes    Types: Marijuana    Comment: Pt sts "anything and everything"  . Sexual activity: Yes    Birth control/protection: Injection    Comment: crack cocaine  Other Topics Concern  . None  Social History Narrative  . None   Additional Social History: N/A    Allergies:   Allergies  Allergen Reactions  . Amoxil [Amoxicillin] Anaphylaxis  . Ketorolac Tromethamine Hives and Swelling  . Prenatal [B-Plex Plus] Nausea Only  . Tegretol [Carbamazepine] Other (See Comments)    Swelling, skin peeling, skin discoloration  . B-Plex Plus Nausea Only    Prenatal   . Tegretol [Carbamazepine] Swelling and Other (See Comments)    Skin peeling Skin discoloring     Labs:  Results for orders placed or performed during the hospital encounter of 07/19/17 (from the past 48 hour(s))  I-Stat beta hCG blood, ED ([facility], [facility], [facility] only)     Status: None   Collection Time: 07/19/17  2:41 PM  Result Value Ref Range   I-stat hCG, quantitative <5.0 <5 mIU/mL   Comment 3            Comment:   GEST. AGE      CONC.  (mIU/mL)   <=1 WEEK        5 - 50     2 WEEKS       50 - 500     3 WEEKS       100 - 10,000     4 WEEKS     1,000 - 30,000        FEMALE AND NON-PREGNANT FEMALE:     LESS THAN 5 mIU/mL     Current Facility-Administered Medications  Medication Dose Route Frequency Provider Last Rate Last Dose  . amantadine (SYMMETREL) capsule 100 mg  100 mg Oral BID Lorre Nick, MD   100 mg at 07/21/17 0913  . ARIPiprazole (ABILIFY) tablet 10 mg  10 mg Oral Daily Lorre Nick, MD   10 mg at 07/21/17 0913  . hydrOXYzine (ATARAX/VISTARIL) tablet 50 mg  50 mg Oral TID PRN Lorre Nick, MD   50 mg at 07/20/17 2125  . mirtazapine (REMERON) tablet 15 mg  15 mg Oral QHS Lorre Nick, MD   15 mg at 07/20/17 2125  . nicotine polacrilex (NICORETTE) gum 2 mg  2 mg Oral PRN Donell Sievert E, PA-C   2 mg  at 07/21/17 0850  . triamterene-hydrochlorothiazide (MAXZIDE-25) 37.5-25 MG per tablet 1 tablet  1 tablet Oral Daily Lorre Nick, MD   1 tablet at 07/21/17 1020   Current Outpatient Medications  Medication Sig Dispense Refill  . amantadine (SYMMETREL) 100 MG capsule Take 1 capsule (100 mg total) 2 (two) times daily by mouth. 60 capsule 0  . ARIPiprazole (ABILIFY PO) Take by mouth. PATIENT SAYS SHE RECEIVED 20 MG VIA INJECTION AT [facility]    . ARIPiprazole (ABILIFY) 10 MG  tablet Take 1 tablet (10 mg total) daily by mouth. (Patient not taking: Reported on 07/19/2017) 30 tablet 0  . hydrOXYzine (ATARAX/VISTARIL) 50 MG tablet Take 1 tablet (50 mg total) 3 (three) times daily as needed by mouth for anxiety. (Patient not taking: Reported on 07/19/2017) 30 tablet 0  . mirtazapine (REMERON) 15 MG tablet Take 1 tablet (15 mg total) at bedtime by mouth. (Patient not taking: Reported on 07/19/2017) 30 tablet 0  . nicotine polacrilex (NICORETTE) 2 MG gum Take 1 each (2 mg total) as needed by mouth for smoking cessation. (Patient not taking: Reported on 07/19/2017) 100 tablet 0  . triamterene-hydrochlorothiazide (MAXZIDE-25) 37.5-25 MG tablet Take 1 tablet daily by mouth. (Patient not taking: Reported on 07/19/2017) 30 tablet 0    Musculoskeletal: Strength & Muscle Tone: within normal limits Gait & Station: normal Patient leans: N/A  Psychiatric Specialty Exam: Physical Exam  Nursing note and vitals reviewed. Constitutional: She is oriented to person, place, and time. She appears well-developed and well-nourished.  HENT:  Head: Normocephalic and atraumatic.  Neck: Normal range of motion.  Respiratory: Effort normal.  Musculoskeletal: Normal range of motion.  Neurological: She is alert and oriented to person, place, and time.  Psychiatric: Her mood appears anxious. Her affect is labile. Her speech is rapid and/or pressured and tangential. Thought content is delusional. Cognition and memory are impaired.  She expresses impulsivity and inappropriate judgment. She is inattentive.    Review of Systems  Psychiatric/Behavioral: Positive for substance abuse. The patient is nervous/anxious.   All other systems reviewed and are negative.   Blood pressure 110/75, pulse (!) 106, temperature 98.5 F (36.9 C), temperature source Oral, resp. rate 16, SpO2 100 %.There is no height or weight on file to calculate BMI.  General Appearance: Casual  Eye Contact:  Fair  Speech:  Pressured  Volume:  Increased  Mood:  Euphoric  Affect:  Congruent  Thought Process:  Coherent and Descriptions of Associations: Tangential  Orientation:  Full (Time, Place, and Person)  Thought Content:  Delusions  Suicidal Thoughts:  No  Homicidal Thoughts:  No  Memory:  Immediate;   Fair Recent;   Fair Remote;   Fair  Judgement:  Impaired  Insight:  Fair  Psychomotor Activity:  Increased  Concentration:  Concentration: Poor and Attention Span: Poor  Recall:  [employer] of Knowledge:  Fair  Language:  Good  Akathisia:  No  Handed:  Right  AIMS (if indicated):    N/A  Assets:  Leisure Time Physical Health Resilience Social Support  ADL's:  Intact  Cognition:  Impaired,  Mild  Sleep:    N/A     Treatment Plan Summary: Daily contact with patient to assess and evaluate symptoms and progress in treatment, Medication management and Plan bipolar affective disorder, mania, severe   -Crisis stabilization -Medication management:  Restarted Abilify 10 mg daily for psychosis, Vistaril 50 mg TID PRN anxiety and Amantadine 100 mg BID for EPS. PRN Geodon 20 mg, Benadryl 50 mg, and Ativan 2 mg IM for mania -Will discontinue Remeron since patient is manic and start Ativan 2 mg qhs for sleep.  -Individual and substance abuse counseling  Disposition: Recommend psychiatric Inpatient admission when medically cleared.  Nanine Means, NP 07/21/2017 1:41 PM   Patient seen face-to-face for psychiatric evaluation, chart reviewed and  case discussed with the physician extender and developed treatment plan. Reviewed the information documented and agree with the treatment plan.  Juanetta Beets, DO

## 2017-07-22 DIAGNOSIS — F250 Schizoaffective disorder, bipolar type: Secondary | ICD-10-CM | POA: Diagnosis not present

## 2017-07-23 DIAGNOSIS — F250 Schizoaffective disorder, bipolar type: Secondary | ICD-10-CM | POA: Diagnosis not present

## 2017-07-24 DIAGNOSIS — F250 Schizoaffective disorder, bipolar type: Secondary | ICD-10-CM | POA: Diagnosis not present

## 2017-07-25 DIAGNOSIS — F250 Schizoaffective disorder, bipolar type: Secondary | ICD-10-CM | POA: Diagnosis not present

## 2017-07-26 DIAGNOSIS — F250 Schizoaffective disorder, bipolar type: Secondary | ICD-10-CM | POA: Diagnosis not present

## 2017-07-27 DIAGNOSIS — F250 Schizoaffective disorder, bipolar type: Secondary | ICD-10-CM | POA: Diagnosis not present

## 2017-07-28 DIAGNOSIS — F250 Schizoaffective disorder, bipolar type: Secondary | ICD-10-CM | POA: Diagnosis not present

## 2017-07-29 DIAGNOSIS — F250 Schizoaffective disorder, bipolar type: Secondary | ICD-10-CM | POA: Diagnosis not present

## 2017-07-30 DIAGNOSIS — F250 Schizoaffective disorder, bipolar type: Secondary | ICD-10-CM | POA: Diagnosis not present

## 2017-07-31 DIAGNOSIS — F250 Schizoaffective disorder, bipolar type: Secondary | ICD-10-CM | POA: Diagnosis not present

## 2017-08-01 DIAGNOSIS — F250 Schizoaffective disorder, bipolar type: Secondary | ICD-10-CM | POA: Diagnosis not present

## 2017-08-02 DIAGNOSIS — F250 Schizoaffective disorder, bipolar type: Secondary | ICD-10-CM | POA: Diagnosis not present

## 2017-08-03 DIAGNOSIS — F250 Schizoaffective disorder, bipolar type: Secondary | ICD-10-CM | POA: Diagnosis not present

## 2017-08-04 DIAGNOSIS — F250 Schizoaffective disorder, bipolar type: Secondary | ICD-10-CM | POA: Diagnosis not present

## 2017-08-05 DIAGNOSIS — F250 Schizoaffective disorder, bipolar type: Secondary | ICD-10-CM | POA: Diagnosis not present

## 2017-08-05 DIAGNOSIS — Z23 Encounter for immunization: Secondary | ICD-10-CM | POA: Diagnosis not present

## 2017-11-12 ENCOUNTER — Emergency Department (HOSPITAL_COMMUNITY)
Admission: EM | Admit: 2017-11-12 | Discharge: 2017-11-12 | Disposition: A | Discharge status: Home / Self Care | Payer: Medicare Other | Attending: Emergency Medicine | Admitting: Emergency Medicine

## 2017-11-12 DIAGNOSIS — F17210 Nicotine dependence, cigarettes, uncomplicated: Secondary | ICD-10-CM | POA: Insufficient documentation

## 2017-11-12 DIAGNOSIS — S6991XA Unspecified injury of right wrist, hand and finger(s), initial encounter: Secondary | ICD-10-CM | POA: Diagnosis not present

## 2017-11-12 DIAGNOSIS — Z79899 Other long term (current) drug therapy: Secondary | ICD-10-CM | POA: Diagnosis not present

## 2017-11-12 DIAGNOSIS — I110 Hypertensive heart disease with heart failure: Secondary | ICD-10-CM | POA: Diagnosis not present

## 2017-11-12 DIAGNOSIS — E0590 Thyrotoxicosis, unspecified without thyrotoxic crisis or storm: Secondary | ICD-10-CM | POA: Insufficient documentation

## 2017-11-12 DIAGNOSIS — F1494 Cocaine use, unspecified with cocaine-induced mood disorder: Secondary | ICD-10-CM | POA: Diagnosis present

## 2017-11-12 DIAGNOSIS — T148XXA Other injury of unspecified body region, initial encounter: Secondary | ICD-10-CM | POA: Diagnosis not present

## 2017-11-12 DIAGNOSIS — F3011 Manic episode without psychotic symptoms, mild: Secondary | ICD-10-CM | POA: Diagnosis not present

## 2017-11-12 DIAGNOSIS — F1414 Cocaine abuse with cocaine-induced mood disorder: Secondary | ICD-10-CM

## 2017-11-12 DIAGNOSIS — I509 Heart failure, unspecified: Secondary | ICD-10-CM | POA: Diagnosis not present

## 2017-11-12 LAB — CBC WITH DIFFERENTIAL/PLATELET
BASOS ABS: 0.1 10*3/uL (ref 0.0–0.1)
Basophils Relative: 1 %
EOS PCT: 1 %
Eosinophils Absolute: 0.1 10*3/uL (ref 0.0–0.7)
HEMATOCRIT: 40.7 % (ref 36.0–46.0)
Hemoglobin: 13.6 g/dL (ref 12.0–15.0)
Lymphocytes Relative: 36 %
Lymphs Abs: 2.8 10*3/uL (ref 0.7–4.0)
MCH: 27.4 pg (ref 26.0–34.0)
MCHC: 33.4 g/dL (ref 30.0–36.0)
MCV: 81.9 fL (ref 78.0–100.0)
MONO ABS: 1 10*3/uL (ref 0.1–1.0)
Monocytes Relative: 13 %
Neutro Abs: 3.7 10*3/uL (ref 1.7–7.7)
Neutrophils Relative %: 49 %
Platelets: 315 10*3/uL (ref 150–400)
RBC: 4.97 MIL/uL (ref 3.87–5.11)
RDW: 14.6 % (ref 11.5–15.5)
WBC: 7.6 10*3/uL (ref 4.0–10.5)

## 2017-11-12 LAB — RAPID URINE DRUG SCREEN, HOSP PERFORMED
AMPHETAMINES: NOT DETECTED
BENZODIAZEPINES: NOT DETECTED
Barbiturates: NOT DETECTED
COCAINE: POSITIVE — AB
Opiates: NOT DETECTED
TETRAHYDROCANNABINOL: NOT DETECTED

## 2017-11-12 LAB — ETHANOL: Alcohol, Ethyl (B): <10 mg/dL (ref <10)

## 2017-11-12 LAB — COMPREHENSIVE METABOLIC PANEL
ALBUMIN: 4.3 g/dL (ref 3.5–5.0)
ALT: 13 U/L — ABNORMAL LOW (ref 14–54)
ANION GAP: 12 (ref 5–15)
AST: 16 U/L (ref 15–41)
Alkaline Phosphatase: 54 U/L (ref 38–126)
BUN: 19 mg/dL (ref 6–20)
CO2: 19 mmol/L — AB (ref 22–32)
Calcium: 9.4 mg/dL (ref 8.9–10.3)
Chloride: 109 mmol/L (ref 101–111)
Creatinine, Ser: 1.39 mg/dL — ABNORMAL HIGH (ref 0.44–1.00)
GFR calc Af Amer: 54 mL/min — ABNORMAL LOW (ref >60)
GFR calc non Af Amer: 47 mL/min — ABNORMAL LOW (ref >60)
GLUCOSE: 80 mg/dL (ref 65–99)
POTASSIUM: 3.8 mmol/L (ref 3.5–5.1)
SODIUM: 140 mmol/L (ref 135–145)
Total Bilirubin: 0.9 mg/dL (ref 0.3–1.2)
Total Protein: 7.6 g/dL (ref 6.5–8.1)

## 2017-11-12 LAB — I-STAT BETA HCG BLOOD, ED (MC, WL, AP ONLY): I-stat hCG, quantitative: <5 m[IU]/mL (ref <5)

## 2017-11-12 MED ORDER — HYDROXYZINE HCL 25 MG PO TABS: 50.0000 mg | ORAL_TABLET | Freq: Three times a day (TID) | ORAL | Status: DC | PRN

## 2017-11-12 MED ORDER — ARIPIPRAZOLE 10 MG PO TABS
10.0000 mg | ORAL_TABLET | Freq: Once | ORAL | Status: DC
  Filled 2017-11-12: qty 1

## 2017-11-12 MED ORDER — NICOTINE POLACRILEX 2 MG MT GUM: 2.0000 mg | CHEWING_GUM | OROMUCOSAL | Status: DC | PRN

## 2017-11-12 MED ORDER — MIRTAZAPINE 30 MG PO TABS: 15.0000 mg | ORAL_TABLET | Freq: Every day | ORAL | Status: DC

## 2017-11-12 MED ORDER — AMANTADINE HCL 100 MG PO CAPS
100.0000 mg | ORAL_CAPSULE | Freq: Two times a day (BID) | ORAL | Status: DC
  Administered 2017-11-12: 100 mg via ORAL
  Filled 2017-11-12: qty 1

## 2017-11-12 MED ORDER — ARIPIPRAZOLE 9.75 MG/1.3ML IM SOLN: 15.0000 mg | Freq: Once | INTRAMUSCULAR | Status: DC

## 2017-11-12 MED ORDER — ARIPIPRAZOLE 10 MG PO TABS
10.0000 mg | ORAL_TABLET | Freq: Every day | ORAL | Status: DC
  Administered 2017-11-12: 10 mg via ORAL
  Filled 2017-11-12: qty 1

## 2017-11-12 MED ORDER — TRIAMTERENE-HCTZ 37.5-25 MG PO TABS
1.0000 | ORAL_TABLET | Freq: Every day | ORAL | Status: DC
  Administered 2017-11-12: 1 via ORAL
  Filled 2017-11-12: qty 1

## 2017-11-12 MED ORDER — BACITRACIN ZINC 500 UNIT/GM EX OINT
TOPICAL_OINTMENT | CUTANEOUS | Status: AC
  Administered 2017-11-12: 1
  Filled 2017-11-12: qty 0.9

## 2017-11-12 NOTE — ED Notes (Signed)
Spoke with Selena Batten with TTs, she will attempt again to talk with her.

## 2017-11-12 NOTE — ED Notes (Signed)
Pt refusing to leave with security present.
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
## 2017-11-12 NOTE — BHH Suicide Risk Assessment (Signed)
Suicide Risk Assessment  Discharge Assessment   [facility] Discharge Suicide Risk Assessment   Principal Problem: Cocaine abuse with cocaine-induced mood disorder [facility]) Discharge Diagnoses:  Patient Active Problem List   Diagnosis Date Noted  . Cocaine abuse with cocaine-induced mood disorder [facility]) [F14.14] 06/02/2016    Priority: High  . Bipolar disorder, current episode manic without psychotic features, severe (HCC) [F31.13]     Priority: High  . Bipolar affective disorder, manic, severe (HCC) [F31.13] 04/30/2017  . Severe bipolar affective disorder with psychosis (HCC) [F31.89] 04/29/2017  . Schizoaffective disorder, bipolar type (HCC) [F25.0]   . Agitation [R45.1]   . Manic behavior (HCC) [F30.10]   . Hyperthyroidism [E05.90] 09/21/2013  . Marijuana abuse [F12.10] 04/12/2013  . History of CHF (congestive heart failure) [Z86.79] 02/01/2013  . Abscess of abdominal wall [L02.211] 10/18/2012  . HTN (hypertension) [I10] 04/08/2011  . Tachycardia [R00.0] 04/08/2011    Total Time spent with patient: 45 minutes  Musculoskeletal: Strength & Muscle Tone: within normal limits Gait & Station: normal Patient leans: N/A  Psychiatric Specialty Exam:   Blood pressure (!) 131/94, pulse 87, temperature 98.8 F (37.1 C), temperature source Oral, resp. rate 18, SpO2 99 %.There is no height or weight on file to calculate BMI.  General Appearance: Disheveled  Eye Contact::  Good  Speech:  Normal Rate409  Volume:  Normal  Mood:  Anxious  Affect:  Congruent  Thought Process:  Coherent and Descriptions of Associations: Intact  Orientation:  Full (Time, Place, and Person)  Thought Content:  WDL and Logical  Suicidal Thoughts:  No  Homicidal Thoughts:  No  Memory:  Immediate;   Fair Recent;   Fair Remote;   Fair  Judgement:  Fair  Insight:  Fair  Psychomotor Activity:  Increased  Concentration:  Fair  Recall:  [employer] of Knowledge:Fair  Language: Fair  Akathisia:  No  Handed:  Right   AIMS (if indicated):     Assets:  Leisure Time Physical Health Resilience  Sleep:     Cognition: WNL  ADL's:  Intact   Mental Status Per Nursing Assessment::   On Admission:   42 yo female, well known to this ED and providers for similar presentations of cocaine abuse any hyperactivity/hypomania.  She unfortunately returns back to using cocaine instead of taking medications.  Not vested in treatment.  No suicidal/homicidal ideations, hallucinations, or withdrawal symptoms.  Demographic Factors:  NA  Loss Factors: NA  Historical Factors: NA  Risk Reduction Factors:   Sense of responsibility to family, Living with another person, especially a relative and Positive social support  Continued Clinical Symptoms:  Hyperactive at times  Cognitive Features That Contribute To Risk:  None    Suicide Risk:  Minimal: No identifiable suicidal ideation.  Patients presenting with no risk factors but with morbid ruminations; may be classified as minimal risk based on the severity of the depressive symptoms    Plan Of Care/Follow-up recommendations:  Activity:  as tolerated Diet:  heart healthy deit  LORD, JAMISON, NP 11/12/2017, 11:52 AM

## 2017-11-12 NOTE — BH Assessment (Signed)
Tele Assessment Note   Patient Name: Christine Cobb MRN: [id number] Referring Physician: Gerhard Munch, MD Location of Patient: [facility] Location of Provider: Behavioral Health TTS Department  Christine Cobb is an 42 y.o. female who present to [facility] via EMS. Patient verbally hostile and argumentative. Patient denies suicidal / homicidal ideations, auditory / visual hallucination, and denies feelings of paranoia. Patient refused to answer additional questions.   TTS re-assessed patient with Dr. Jeraldine Loots present. Patient continue to be verbally aggressive and hostile. Patient continues to denies SI, HI and AVH. Patient denies she wants to go to behavioral health. Patient denies she's afraid for her safety.   TTS re-assessed patient with Nanine Means, NP. Patient calm and speaking in a regular tone, not hostile or aggressive. Patient continues to deny SI, HI and AVH. Patient became tearful. Patient displays rapid mood changes.    Diagnosis: F31.13   Bipolar I disorder, Current or most recent episode manic, Severe   Past Medical History:  Past Medical History:  Diagnosis Date  . Arm pain   . Bipolar affective disorder, currently manic, mild (HCC)   . CHF (congestive heart failure) (HCC)   . Eye globe prosthesis   . HTN (hypertension)   . Hyperthyroidism   . Sinus tachycardia     Past Surgical History:  Procedure Laterality Date  . DILATION AND CURETTAGE OF UTERUS    . EYE SURGERY      Family History:  Family History  Problem Relation Age of Onset  . Hypertension Other   . Emphysema Other   . Asthma Son   . Diabetes Maternal Uncle   . Diabetes Paternal Grandmother     Social History:  reports that she has been smoking cigarettes.  She has never used smokeless tobacco. She reports that she drinks alcohol. She reports that she has current or past drug history. Drug: Marijuana.  Additional Social History:  Alcohol / Drug Use Pain Medications: see  MAR Prescriptions: see MAR Over the Counter: see MAR History of alcohol / drug use?: Yes Substance #1 Name of Substance 1: cocaine 1 - Age of First Use: unknown 1 - Amount (size/oz): unknown 1 - Frequency: unknown 1 - Duration: unknown 1 - Last Use / Amount: 11/11/2017  CIWA: CIWA-Ar BP: (!) 131/94 Pulse Rate: 87 COWS:    Allergies:  Allergies  Allergen Reactions  . Amoxil [Amoxicillin] Anaphylaxis  . Ketorolac Tromethamine Hives and Swelling  . Prenatal [B-Plex Plus] Nausea Only  . Tegretol [Carbamazepine] Other (See Comments)    Swelling, skin peeling, skin discoloration  . B-Plex Plus Nausea Only    Prenatal   . Tegretol [Carbamazepine] Swelling and Other (See Comments)    Skin peeling Skin discoloring     Home Medications:  (Not in a hospital admission)  OB/GYN Status:  No LMP recorded.  General Assessment Data Assessment unable to be completed: Yes Reason for not completing assessment: patient is asleep, unable to be awaken Location of Assessment: [facility] TTS Assessment: In system Is this a Tele or Face-to-Face Assessment?: Face-to-Face Is this an Initial Assessment or a Re-assessment for this encounter?: Initial Assessment Marital status: Single Living Arrangements: Alone Can pt return to current living arrangement?: Yes Admission Status: Voluntary Is patient capable of signing voluntary admission?: Yes Referral Source: Self/Family/Friend Insurance type: Medicare     Crisis Care Plan Living Arrangements: Alone Name of Psychiatrist: UTA Name of Therapist: UTA     Risk to self with the past 6 months Suicidal  Ideation: No Has patient been a risk to self within the past 6 months prior to admission? : No Suicidal Intent: No Has patient had any suicidal intent within the past 6 months prior to admission? : No Is patient at risk for suicide?: No Suicidal Plan?: No Has patient had any suicidal plan within the past 6 months prior to admission? :  No Access to Means: No What has been your use of drugs/alcohol within the last 12 months?: cocaine Previous Attempts/Gestures: No How many times?: (unknown, pt denies current SI ) Other Self Harm Risks: pt denies Triggers for Past Attempts: None known Intentional Self Injurious Behavior: None Family Suicide History: No Recent stressful life event(s): Other (Comment)(none known ) Persecutory voices/beliefs?: No Depression: Yes Depression Symptoms: Feeling angry/irritable Substance abuse history and/or treatment for substance abuse?: No Suicide prevention information given to non-admitted patients: Not applicable  Risk to Others within the past 6 months Homicidal Ideation: No Does patient have any lifetime risk of violence toward others beyond the six months prior to admission? : No Thoughts of Harm to Others: No Current Homicidal Intent: No Current Homicidal Plan: No Access to Homicidal Means: No Identified Victim: n/a History of harm to others?: No Assessment of Violence: None Noted Violent Behavior Description: verbally aggressive Does patient have access to weapons?: No Criminal Charges Pending?: No Does patient have a court date: No Is patient on probation?: No  Psychosis Hallucinations: None noted Delusions: None noted  Mental Status Report Appearance/Hygiene: In scrubs Eye Contact: Fair Motor Activity: Freedom of movement Speech: Aggressive Level of Consciousness: Alert, Combative Mood: Angry Affect: Angry Anxiety Level: None Thought Processes: Coherent, Relevant Judgement: Unimpaired Orientation: Person, Situation, Time, Place Obsessive Compulsive Thoughts/Behaviors: None  Cognitive Functioning Concentration: Normal Memory: Recent Intact, Remote Intact Is patient IDD: No Is patient DD?: No Insight: Fair Impulse Control: Fair Appetite: Fair Have you had any weight changes? : No Change Sleep: Unable to Assess Vegetative Symptoms: Unable to  Assess  ADLScreening [facility] Assessment Services) Patient's cognitive ability adequate to safely complete daily activities?: Yes Patient able to express need for assistance with ADLs?: Yes Independently performs ADLs?: Yes (appropriate for developmental age)  Prior Inpatient Therapy Prior Inpatient Therapy: (UTA)  Prior Outpatient Therapy Prior Outpatient Therapy: (UTA)  ADL Screening (condition at time of admission) Patient's cognitive ability adequate to safely complete daily activities?: Yes Is the patient deaf or have difficulty hearing?: No Does the patient have difficulty seeing, even when wearing glasses/contacts?: No Does the patient have difficulty concentrating, remembering, or making decisions?: No Patient able to express need for assistance with ADLs?: Yes Does the patient have difficulty dressing or bathing?: No Independently performs ADLs?: Yes (appropriate for developmental age) Does the patient have difficulty walking or climbing stairs?: No       Abuse/Neglect Assessment (Assessment to be complete while patient is alone) Abuse/Neglect Assessment Can Be Completed: Unable to assess, patient is non-responsive or altered mental status     Advance Directives (For Healthcare) Does Patient Have a Medical Advance Directive?: No Would patient like information on creating a medical advance directive?: No - Patient declined          Disposition:  Disposition Initial Assessment Completed for this Encounter: Yes(Jamison Lord, NP, recommend D/C)     Carlisle Enke [facility] 11/12/2017 11:57 AM

## 2017-11-12 NOTE — ED Notes (Signed)
Pt would not sign discharge

## 2017-11-12 NOTE — ED Notes (Signed)
Dr Mia Creek in to see pt, difficult to follow story. Pt requesting to go across street for help with her multiple problems she keeps listing.

## 2017-11-12 NOTE — Discharge Instructions (Addendum)
As discussed, it is important that you follow up as soon as possible with your physician for continued management of your condition. ° °If you develop any new, or concerning changes in your condition, please return to the emergency department immediately. ° °

## 2017-11-12 NOTE — ED Notes (Signed)
Pt resistant to change into scrubs, Dr Jeraldine Loots aware.

## 2017-11-12 NOTE — ED Notes (Signed)
Pt sleeping at present time. Continue to monitor

## 2017-11-12 NOTE — ED Notes (Signed)
Pt refused to allow nurse explain discharge instructions. She demanded writer leave the room, which she did, Security at bedside

## 2017-11-12 NOTE — ED Notes (Signed)
Pt refused vitals 

## 2017-11-12 NOTE — ED Notes (Signed)
Pt refused to change into scrubs for further evaluation. She states she will keep the clothes but not change. Dr Jeraldine Loots aware and plans to speak with pt.

## 2017-11-12 NOTE — ED Notes (Signed)
Kim with TTS spoke with pt then with Dr Jeraldine Loots regarding evaluation
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
## 2017-11-12 NOTE — ED Notes (Signed)
Pt continues to tell Dr Jeraldine Loots she will change but will not when he walks out of the room.

## 2017-11-12 NOTE — BHH Counselor (Signed)
TTS writer attempted to assess patient. Patient is sleep and unable to be awaken with verbal requests.

## 2017-11-12 NOTE — ED Provider Notes (Addendum)
[facility]-EMERGENCY DEPT Provider Note   CSN: [id number] Arrival date & time: 11/12/17  0126     History   Chief Complaint No chief complaint on file.   HPI Christine Cobb is a 42 y.o. female.  HPI Patient presents floridly manic. 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 She also complains of pain in her knees from a fall. Level 5 caveat secondary to psychiatric condition. Past Medical History:  Diagnosis Date  . Arm pain   . Bipolar affective disorder, currently manic, mild (HCC)   . CHF (congestive heart failure) (HCC)   . Eye globe prosthesis   . HTN (hypertension)   . Hyperthyroidism   . Sinus tachycardia     Patient Active Problem List   Diagnosis Date Noted  . Bipolar affective disorder, manic, severe (HCC) 04/30/2017  . Severe bipolar affective disorder with psychosis (HCC) 04/29/2017  . Schizoaffective disorder, bipolar type (HCC)   . Cocaine abuse (HCC) 12/31/2014  . Bipolar disorder, current episode manic without psychotic features, severe (HCC)   . Agitation   . Manic behavior (HCC)   . Hyperthyroidism 09/21/2013  . Marijuana abuse 04/12/2013  . History of CHF (congestive heart failure) 02/01/2013  . Abscess of abdominal wall 10/18/2012  . HTN (hypertension) 04/08/2011  . Tachycardia 04/08/2011    Past Surgical History:  Procedure Laterality Date  . DILATION AND CURETTAGE OF UTERUS    . EYE SURGERY       OB History    Gravida  3   Para  2   Term  2   Preterm      AB  1   Living  2     SAB  1   TAB  0   Ectopic      Multiple      Live Births  2            Home Medications    Prior to Admission medications   Medication Sig Start Date End Date Taking? Authorizing Provider  amantadine (SYMMETREL) 100  MG capsule Take 1 capsule (100 mg total) 2 (two) times daily by mouth. 05/09/17   Oneta Rack, NP  ARIPiprazole (ABILIFY PO) Take by mouth. PATIENT SAYS SHE RECEIVED 20 MG VIA INJECTION AT [facility]    [provider]  ARIPiprazole (ABILIFY) 10 MG tablet Take 1 tablet (10 mg total) daily by mouth. Patient not taking: Reported on 07/19/2017 05/10/17   Oneta Rack, NP  hydrOXYzine (ATARAX/VISTARIL) 50 MG tablet Take 1 tablet (50 mg total) 3 (three) times daily as needed by mouth for anxiety. Patient not taking: Reported on 07/19/2017 05/09/17   Oneta Rack, NP  mirtazapine (REMERON) 15 MG tablet Take 1 tablet (15 mg total) at bedtime by mouth. Patient not taking: Reported on 07/19/2017 05/09/17   Oneta Rack, NP  nicotine polacrilex (NICORETTE) 2 MG gum Take 1 each (2 mg total) as needed by mouth for smoking cessation. Patient not taking: Reported on 07/19/2017 05/09/17   Oneta Rack, NP  triamterene-hydrochlorothiazide (MAXZIDE-25) 37.5-25 MG tablet Take 1 tablet daily by mouth. Patient not taking: Reported on 07/19/2017 05/10/17   Oneta Rack, NP    Family History Family History  Problem Relation Age of Onset  . Hypertension Other   . Emphysema Other   .  Asthma Son   . Diabetes Maternal Uncle   . Diabetes Paternal Grandmother     Social History Social History   Tobacco Use  . Smoking status: Current Every Day Smoker    Types: Cigarettes    Last attempt to quit: 08/21/2013    Years since quitting: 4.2  . Smokeless tobacco: Never Used  Substance Use Topics  . Alcohol use: Yes    Alcohol/week: 0.0 oz    Comment: Last drink: 1/2 beer PTA  . Drug use: Yes    Types: Marijuana    Comment: Pt sts "anything and everything"     Allergies   Amoxil [amoxicillin]; Ketorolac tromethamine; Prenatal [b-plex plus]; Tegretol [carbamazepine]; B-plex plus; and Tegretol [carbamazepine]   Review of Systems Review of Systems  Unable to perform ROS: Psychiatric  disorder     Physical Exam Updated Vital Signs BP (!) 131/94 (BP Location: Left Arm)   Pulse 87   Temp 98.8 F (37.1 C) (Oral)   Resp 18   SpO2 99%   Physical Exam  Constitutional: She appears well-developed and well-nourished.  Anxious, talkative female in no physical distress  HENT:  Head: Normocephalic and atraumatic.  Eyes: Right eye exhibits normal extraocular motion.    Cardiovascular: Normal rate and regular rhythm.  Pulmonary/Chest: Effort normal and breath sounds normal. No stridor. No respiratory distress.  Abdominal: She exhibits no distension.  Musculoskeletal: She exhibits no edema.  Neurological: She is alert. No cranial nerve deficit.  Patient does not follow neurologic commands consistently, but has a normal gait, moves all extremities spontaneously, has no facial asymmetry, and beyond her left eye injury, has an unremarkable HEENT exam  Skin: Skin is warm and dry.  Unremarkable abrasions bilateral knees  Psychiatric: Her mood appears anxious. Her speech is rapid and/or pressured and tangential. She is hyperactive. Thought content is paranoid and delusional. Cognition and memory are impaired.  Nursing note and vitals reviewed.    ED Treatments / Results  Labs (all labs ordered are listed, but only abnormal results are displayed) Labs Reviewed  COMPREHENSIVE METABOLIC PANEL - Abnormal; Notable for the following components:      Result Value   CO2 19 (*)    Creatinine, Ser 1.39 (*)    ALT 13 (*)    GFR calc non Af Amer 47 (*)    GFR calc Af Amer 54 (*)    All other components within normal limits  RAPID URINE DRUG SCREEN, HOSP PERFORMED - Abnormal; Notable for the following components:   Cocaine POSITIVE (*)    All other components within normal limits  ETHANOL  CBC WITH DIFFERENTIAL/PLATELET  I-STAT BETA HCG BLOOD, ED ([facility], [facility], [facility] ONLY)     Procedures Procedures (including critical care time)  Medications Ordered in ED Medications  amantadine  (SYMMETREL) capsule 100 mg (100 mg Oral Given 11/12/17 1038)  nicotine polacrilex (NICORETTE) gum 2 mg (has no administration in time range)  triamterene-hydrochlorothiazide (MAXZIDE-25) 37.5-25 MG per tablet 1 tablet (1 tablet Oral Given 11/12/17 1038)  mirtazapine (REMERON) tablet 15 mg (has no administration in time range)  hydrOXYzine (ATARAX/VISTARIL) tablet 50 mg (has no administration in time range)  ARIPiprazole (ABILIFY) tablet 10 mg (10 mg Oral Given 11/12/17 1038)  bacitracin 500 UNIT/GM ointment (has no administration in time range)  ARIPiprazole (ABILIFY) injection 15 mg (has no administration in time range)     Initial Impression / Assessment and Plan / ED Course  I have reviewed the triage vital signs and the  nursing notes.  Pertinent labs & imaging results that were available during my care of the patient were reviewed by me and considered in my medical decision making (see chart for details).  This patient presents with the need for medical evaluation due to ongoing psychiatric condition.  The patient's medical portion of the evaluation is generally reassuring, with no evidence of acute new pathology.  The patient has been medically cleared for further psychiatric evaluation.  10:41 AM Patient is ambulatory, taking time to change into hospital scrubs.   11:58 AM Patient continues to deny suicidal or homicidal ideation, but does have rambling speech. Patient has been seen by our behavioral health team. She does not meet acute criteria for hospitalization, states that she would like to take a nap and watch TV, then would like to go home. Final Clinical Impressions(s) / ED Diagnoses  Manic behavior   Gerhard Munch, MD 11/12/17 0945    Gerhard Munch, MD 11/12/17 1042    Gerhard Munch, MD 11/12/17 1158

## 2017-11-12 NOTE — ED Notes (Signed)
Pt has been d/c but is refusing to leave the room. Off-duty [employer] officers and security are outside the patients room explaining that she has been discharged and needs to leave the room. Pt continues to argue with security, yelling, "you need to stay in yo lane." Pt argues loudly with security and refuses to get off the bed. Pt was eventually escorted out of the department by security and [employer].

## 2017-11-25 DIAGNOSIS — K769 Liver disease, unspecified: Secondary | ICD-10-CM | POA: Diagnosis not present

## 2017-11-25 DIAGNOSIS — A599 Trichomoniasis, unspecified: Secondary | ICD-10-CM | POA: Diagnosis not present

## 2017-11-25 DIAGNOSIS — E119 Type 2 diabetes mellitus without complications: Secondary | ICD-10-CM | POA: Diagnosis not present

## 2017-11-25 DIAGNOSIS — N760 Acute vaginitis: Secondary | ICD-10-CM | POA: Diagnosis not present

## 2017-11-25 DIAGNOSIS — E785 Hyperlipidemia, unspecified: Secondary | ICD-10-CM | POA: Diagnosis not present

## 2017-11-25 DIAGNOSIS — B009 Herpesviral infection, unspecified: Secondary | ICD-10-CM | POA: Diagnosis not present

## 2017-11-25 DIAGNOSIS — B20 Human immunodeficiency virus [HIV] disease: Secondary | ICD-10-CM | POA: Diagnosis not present

## 2018-01-18 DIAGNOSIS — Z5181 Encounter for therapeutic drug level monitoring: Secondary | ICD-10-CM | POA: Diagnosis not present

## 2018-02-02 DIAGNOSIS — Z5181 Encounter for therapeutic drug level monitoring: Secondary | ICD-10-CM | POA: Diagnosis not present

## 2018-02-07 ENCOUNTER — Emergency Department (HOSPITAL_COMMUNITY)
Admission: EM | Admit: 2018-02-07 | Discharge: 2018-02-07 | Discharge status: Against Medical Advice | Payer: Medicare Other | Attending: Emergency Medicine | Admitting: Emergency Medicine

## 2018-02-07 DIAGNOSIS — R52 Pain, unspecified: Secondary | ICD-10-CM | POA: Diagnosis not present

## 2018-02-07 DIAGNOSIS — J45909 Unspecified asthma, uncomplicated: Secondary | ICD-10-CM | POA: Insufficient documentation

## 2018-02-07 DIAGNOSIS — R1084 Generalized abdominal pain: Secondary | ICD-10-CM | POA: Diagnosis not present

## 2018-02-07 DIAGNOSIS — R110 Nausea: Secondary | ICD-10-CM | POA: Diagnosis not present

## 2018-02-07 DIAGNOSIS — R109 Unspecified abdominal pain: Secondary | ICD-10-CM | POA: Insufficient documentation

## 2018-02-07 DIAGNOSIS — Z5320 Procedure and treatment not carried out because of patient's decision for unspecified reasons: Secondary | ICD-10-CM | POA: Diagnosis not present

## 2018-02-07 MED ORDER — ALBUTEROL SULFATE HFA 108 (90 BASE) MCG/ACT IN AERS: 2.0000 | INHALATION_SPRAY | Freq: Once | RESPIRATORY_TRACT | Status: DC

## 2018-02-07 NOTE — ED Triage Notes (Signed)
Pt arrives by [facility] from an adult day program for abd pain. Pt arrives to ED and states she needs an inhaler, Pt states "no one can draw her blood".

## 2018-02-07 NOTE — ED Provider Notes (Addendum)
Patient placed in Quick Look pathway, seen and evaluated   Chief Complaint: asthma, abdominal pain  HPI:  Pt is out of her albuterol and feels like she is wheezing  ROS: no fever no chills  Physical Exam:   Gen: No distress  Neuro: Awake and Alert  Skin: Warm    Focused Exam: anxious, Lungs faint wheezing.    Initiation of care has begun. The patient has been counseled on the process, plan, and necessity for staying for the completion/evaluation, and the remainder of the medical screening examination    Elson Areas, PA-C 02/07/18 1344 Pt refuses to let tech drawer her blood.  Pt refuses urine.  Pt denies suicidal thoughts.  She denies homicidal thoughts.  Pt has a history of schizoaffective disorder.  Pt yelling at nurses and other patients.   Pt's family contacted.  Pt is in a day treatment program.  Pt yelling at staff, Pt demands a sandwich and a bus pass.   I spoke with pt.  I advised her if she does not want assessment or treatment she is free to leave.  I do not feel she is can be IVCed.     Elson Areas, [place] 02/07/18 1554    Derwood Kaplan, MD 02/10/18 1038

## 2018-02-07 NOTE — ED Notes (Signed)
Pt screaming at Quick look provider and this RN in triage. Pt rejected blood work and further testing as well as treatment. Pt escorted to waiting room by this RN and given option to sit and wait to go to a room, pt continued to scream and curse and escorted to bus stop by security. Pt appeared to be in NAD.

## 2018-02-07 NOTE — ED Notes (Signed)
Patient called for vitals recheck and no answer. Pt also called by pharmacy tech with no answer.

## 2018-02-07 NOTE — ED Notes (Signed)
Pt will not allow [place] to draw her blood at this time. "you are not a doctor and I do not trust you." Pt having flight of ideas and very agitated at this time".

## 2018-03-01 ENCOUNTER — Emergency Department (HOSPITAL_COMMUNITY)
Admission: EM | Admit: 2018-03-01 | Discharge: 2018-03-01 | Disposition: A | Discharge status: Home / Self Care | Payer: Medicare Other | Attending: Emergency Medicine | Admitting: Emergency Medicine

## 2018-03-01 ENCOUNTER — Other Ambulatory Visit: Payer: Self-pay

## 2018-03-01 DIAGNOSIS — R4689 Other symptoms and signs involving appearance and behavior: Secondary | ICD-10-CM | POA: Diagnosis not present

## 2018-03-01 DIAGNOSIS — Z5321 Procedure and treatment not carried out due to patient leaving prior to being seen by health care provider: Secondary | ICD-10-CM | POA: Insufficient documentation

## 2018-03-01 DIAGNOSIS — R4189 Other symptoms and signs involving cognitive functions and awareness: Secondary | ICD-10-CM

## 2018-03-01 NOTE — ED Notes (Signed)
Patient eloped from department
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
## 2018-07-06 ENCOUNTER — Emergency Department (HOSPITAL_COMMUNITY)
Admission: EM | Admit: 2018-07-06 | Discharge: 2018-07-07 | Disposition: A | Discharge status: Home / Self Care | Payer: Medicare Other | Attending: Emergency Medicine | Admitting: Emergency Medicine

## 2018-07-06 DIAGNOSIS — F1210 Cannabis abuse, uncomplicated: Secondary | ICD-10-CM | POA: Insufficient documentation

## 2018-07-06 DIAGNOSIS — F1410 Cocaine abuse, uncomplicated: Secondary | ICD-10-CM | POA: Diagnosis not present

## 2018-07-06 DIAGNOSIS — I509 Heart failure, unspecified: Secondary | ICD-10-CM | POA: Insufficient documentation

## 2018-07-06 DIAGNOSIS — R462 Strange and inexplicable behavior: Secondary | ICD-10-CM | POA: Diagnosis present

## 2018-07-06 DIAGNOSIS — F17210 Nicotine dependence, cigarettes, uncomplicated: Secondary | ICD-10-CM | POA: Insufficient documentation

## 2018-07-06 DIAGNOSIS — F3113 Bipolar disorder, current episode manic without psychotic features, severe: Secondary | ICD-10-CM | POA: Diagnosis not present

## 2018-07-06 DIAGNOSIS — F528 Other sexual dysfunction not due to a substance or known physiological condition: Secondary | ICD-10-CM | POA: Insufficient documentation

## 2018-07-06 DIAGNOSIS — I110 Hypertensive heart disease with heart failure: Secondary | ICD-10-CM | POA: Diagnosis not present

## 2018-07-06 DIAGNOSIS — Z79899 Other long term (current) drug therapy: Secondary | ICD-10-CM | POA: Insufficient documentation

## 2018-07-06 DIAGNOSIS — F1994 Other psychoactive substance use, unspecified with psychoactive substance-induced mood disorder: Secondary | ICD-10-CM | POA: Diagnosis not present

## 2018-07-06 DIAGNOSIS — F29 Unspecified psychosis not due to a substance or known physiological condition: Secondary | ICD-10-CM

## 2018-07-06 LAB — CBC WITH DIFFERENTIAL/PLATELET
ABS IMMATURE GRANULOCYTES: 0.05 10*3/uL (ref 0.00–0.07)
BASOS ABS: 0.1 10*3/uL (ref 0.0–0.1)
Basophils Relative: 1 %
Eosinophils Absolute: 0.1 10*3/uL (ref 0.0–0.5)
Eosinophils Relative: 1 %
HEMATOCRIT: 39.8 % (ref 36.0–46.0)
Hemoglobin: 12.3 g/dL (ref 12.0–15.0)
IMMATURE GRANULOCYTES: 1 %
LYMPHS ABS: 2.3 10*3/uL (ref 0.7–4.0)
LYMPHS PCT: 31 %
MCH: 26.4 pg (ref 26.0–34.0)
MCHC: 30.9 g/dL (ref 30.0–36.0)
MCV: 85.4 fL (ref 80.0–100.0)
MONOS PCT: 8 %
Monocytes Absolute: 0.6 10*3/uL (ref 0.1–1.0)
NEUTROS ABS: 4.3 10*3/uL (ref 1.7–7.7)
Neutrophils Relative %: 58 %
Platelets: 291 10*3/uL (ref 150–400)
RBC: 4.66 MIL/uL (ref 3.87–5.11)
RDW: 14.7 % (ref 11.5–15.5)
WBC: 7.4 10*3/uL (ref 4.0–10.5)
nRBC: 0 % (ref 0.0–0.2)

## 2018-07-06 LAB — COMPREHENSIVE METABOLIC PANEL
ALBUMIN: 3.6 g/dL (ref 3.5–5.0)
ALT: 82 U/L — ABNORMAL HIGH (ref 0–44)
AST: 70 U/L — AB (ref 15–41)
Alkaline Phosphatase: 94 U/L (ref 38–126)
Anion gap: 6 (ref 5–15)
BILIRUBIN TOTAL: 0.9 mg/dL (ref 0.3–1.2)
BUN: 26 mg/dL — AB (ref 6–20)
CHLORIDE: 111 mmol/L (ref 98–111)
CO2: 23 mmol/L (ref 22–32)
Calcium: 8.7 mg/dL — ABNORMAL LOW (ref 8.9–10.3)
Creatinine, Ser: 1.35 mg/dL — ABNORMAL HIGH (ref 0.44–1.00)
GFR calc Af Amer: 57 mL/min — ABNORMAL LOW (ref >60)
GFR calc non Af Amer: 49 mL/min — ABNORMAL LOW (ref >60)
GLUCOSE: 74 mg/dL (ref 70–99)
POTASSIUM: 4.6 mmol/L (ref 3.5–5.1)
SODIUM: 140 mmol/L (ref 135–145)
Total Protein: 6.9 g/dL (ref 6.5–8.1)

## 2018-07-06 LAB — ACETAMINOPHEN LEVEL

## 2018-07-06 LAB — ETHANOL: Alcohol, Ethyl (B): <10 mg/dL (ref <10)

## 2018-07-06 LAB — I-STAT BETA HCG BLOOD, ED (MC, WL, AP ONLY): I-stat hCG, quantitative: <5 m[IU]/mL (ref <5)

## 2018-07-06 LAB — SALICYLATE LEVEL

## 2018-07-06 MED ORDER — AMANTADINE HCL 100 MG PO CAPS
100.0000 mg | ORAL_CAPSULE | Freq: Two times a day (BID) | ORAL | Status: DC
  Administered 2018-07-07: 100 mg via ORAL
  Filled 2018-07-06: qty 1

## 2018-07-06 MED ORDER — HYDROXYZINE HCL 25 MG PO TABS: 50.0000 mg | ORAL_TABLET | Freq: Three times a day (TID) | ORAL | Status: DC | PRN

## 2018-07-06 MED ORDER — MIRTAZAPINE 7.5 MG PO TABS: 15.0000 mg | ORAL_TABLET | Freq: Every day | ORAL | Status: DC

## 2018-07-06 MED ORDER — STERILE WATER FOR INJECTION IJ SOLN
INTRAMUSCULAR | Status: AC
  Administered 2018-07-06: 19:00:00
  Filled 2018-07-06: qty 10

## 2018-07-06 MED ORDER — ARIPIPRAZOLE 10 MG PO TABS
10.0000 mg | ORAL_TABLET | Freq: Every day | ORAL | Status: DC
  Administered 2018-07-07: 10 mg via ORAL
  Filled 2018-07-06: qty 1

## 2018-07-06 MED ORDER — ZIPRASIDONE MESYLATE 20 MG IM SOLR
20.0000 mg | Freq: Once | INTRAMUSCULAR | Status: AC
  Administered 2018-07-06: 20 mg via INTRAMUSCULAR
  Filled 2018-07-06: qty 20

## 2018-07-06 MED ORDER — LORAZEPAM 2 MG/ML IJ SOLN
2.0000 mg | Freq: Once | INTRAMUSCULAR | Status: AC
  Administered 2018-07-06: 2 mg via INTRAMUSCULAR
  Filled 2018-07-06: qty 1
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
## 2018-07-06 NOTE — ED Provider Notes (Signed)
[facility]-EMERGENCY DEPT Provider Note   CSN: [id number] Arrival date & time: 07/06/18  1648     History   Chief Complaint Chief Complaint  Patient presents with  . Medical Clearance    HPI Buena [name] is a 43 y.o. female.  HPI Patient presented to the emergency room for bizarre behavior.  Patient has a history of bipolar disorder.  She also has history of substance abuse.  According to the EMS report the patient was at a store and she was acting bizarrely.  Patient then started to make lewd remarks to other people at the store and she started stripping off her clothes.  Police and EMS were called.  Patient was given Versed IM prior to arrival to the ED.  Right now she is sleeping and difficult to arouse. Past Medical History:  Diagnosis Date  . Arm pain   . Bipolar affective disorder, currently manic, mild (HCC)   . CHF (congestive heart failure) (HCC)   . Eye globe prosthesis   . HTN (hypertension)   . Hyperthyroidism   . Sinus tachycardia     Patient Active Problem List   Diagnosis Date Noted  . Bipolar affective disorder, manic, severe (HCC) 04/30/2017  . Severe bipolar affective disorder with psychosis (HCC) 04/29/2017  . Cocaine abuse with cocaine-induced mood disorder (HCC) 06/02/2016  . Schizoaffective disorder, bipolar type (HCC)   . Bipolar disorder, current episode manic without psychotic features, severe (HCC)   . Agitation   . Manic behavior (HCC)   . Hyperthyroidism 09/21/2013  . Marijuana abuse 04/12/2013  . History of CHF (congestive heart failure) 02/01/2013  . Abscess of abdominal wall 10/18/2012  . HTN (hypertension) 04/08/2011  . Tachycardia 04/08/2011    Past Surgical History:  Procedure Laterality Date  . DILATION AND CURETTAGE OF UTERUS    . EYE SURGERY       OB History    Gravida  3   Para  2   Term  2   Preterm      AB  1   Living  2     SAB  1   TAB  0   Ectopic      Multiple      Live Births  2            Home Medications    Prior to Admission medications   Medication Sig Start Date End Date Taking? Authorizing Provider  amantadine (SYMMETREL) 100 MG capsule Take 1 capsule (100 mg total) 2 (two) times daily by mouth. 05/09/17   Oneta Rack, NP  ARIPiprazole (ABILIFY) 10 MG tablet Take 1 tablet (10 mg total) daily by mouth. Patient not taking: Reported on 07/19/2017 05/10/17   Oneta Rack, NP  hydrOXYzine (ATARAX/VISTARIL) 50 MG tablet Take 1 tablet (50 mg total) 3 (three) times daily as needed by mouth for anxiety. Patient not taking: Reported on 07/19/2017 05/09/17   Oneta Rack, NP  mirtazapine (REMERON) 15 MG tablet Take 1 tablet (15 mg total) at bedtime by mouth. Patient not taking: Reported on 07/19/2017 05/09/17   Oneta Rack, NP  nicotine polacrilex (NICORETTE) 2 MG gum Take 1 each (2 mg total) as needed by mouth for smoking cessation. Patient not taking: Reported on 07/19/2017 05/09/17   Oneta Rack, NP    Family History Family History  Problem Relation Age of Onset  . Hypertension Other   . Emphysema Other   . Asthma Son   .  Diabetes Maternal Uncle   . Diabetes Paternal Grandmother     Social History Social History   Tobacco Use  . Smoking status: Current Every Day Smoker    Types: Cigarettes    Last attempt to quit: 08/21/2013    Years since quitting: 4.8  . Smokeless tobacco: Never Used  Substance Use Topics  . Alcohol use: Yes    Comment: Last drink: 1/2 beer PTA  . Drug use: Yes    Types: Marijuana    Comment: Pt sts "anything and everything"     Allergies   Amoxil [amoxicillin]; Ketorolac tromethamine; Prenatal [b-plex plus]; Tegretol [carbamazepine]; B-plex plus; and Tegretol [carbamazepine]   Review of Systems Review of Systems  All other systems reviewed and are negative.    Physical Exam Updated Vital Signs BP (!) 117/97 (BP Location: Right Arm)   Pulse 97   Temp 97.9 F (36.6 C) (Oral)   Resp  16   SpO2 97%   Physical Exam Vitals signs and nursing note reviewed.  Constitutional:      General: She is not in acute distress.    Appearance: She is well-developed.     Comments: Sleeping  HENT:     Head: Normocephalic and atraumatic.     Right Ear: External ear normal.     Left Ear: External ear normal.  Eyes:     General: No scleral icterus.       Right eye: No discharge.        Left eye: No discharge.     Conjunctiva/sclera: Conjunctivae normal.  Neck:     Musculoskeletal: Neck supple.     Trachea: No tracheal deviation.  Cardiovascular:     Rate and Rhythm: Normal rate and regular rhythm.  Pulmonary:     Effort: Pulmonary effort is normal. No respiratory distress.     Breath sounds: Normal breath sounds. No stridor. No wheezing or rales.  Abdominal:     General: Bowel sounds are normal. There is no distension.     Palpations: Abdomen is soft.     Tenderness: There is no abdominal tenderness. There is no guarding or rebound.  Musculoskeletal:        General: No tenderness.  Skin:    General: Skin is warm and dry.     Findings: No rash.  Neurological:     Motor: No abnormal muscle tone or seizure activity.     Comments: No facial droop noted      ED Treatments / Results  Labs (all labs ordered are listed, but only abnormal results are displayed) Labs Reviewed  COMPREHENSIVE METABOLIC PANEL - Abnormal; Notable for the following components:      Result Value   BUN 26 (*)    Creatinine, Ser 1.35 (*)    Calcium 8.7 (*)    AST 70 (*)    ALT 82 (*)    GFR calc non Af Amer 49 (*)    GFR calc Af Amer 57 (*)    All other components within normal limits  ACETAMINOPHEN LEVEL - Abnormal; Notable for the following components:   Acetaminophen (Tylenol), Serum <10 (*)    All other components within normal limits  ETHANOL  CBC WITH DIFFERENTIAL/PLATELET  SALICYLATE LEVEL  RAPID URINE DRUG SCREEN, HOSP PERFORMED  I-STAT BETA HCG BLOOD, ED ([facility], [facility], [facility] ONLY)     EKG None  Radiology No results found.  Procedures Procedures (including critical care time)  Medications Ordered in ED Medications  amantadine (SYMMETREL) capsule  100 mg (has no administration in time range)  ARIPiprazole (ABILIFY) tablet 10 mg (has no administration in time range)  mirtazapine (REMERON) tablet 15 mg (has no administration in time range)  hydrOXYzine (ATARAX/VISTARIL) tablet 50 mg (has no administration in time range)  ziprasidone (GEODON) injection 20 mg (20 mg Intramuscular Given 07/06/18 1853)  sterile water (preservative free) injection (  Given 07/06/18 1853)  LORazepam (ATIVAN) injection 2 mg (2 mg Intramuscular Given 07/06/18 1906)     Initial Impression / Assessment and Plan / ED Course  I have reviewed the triage vital signs and the nursing notes.  Pertinent labs & imaging results that were available during my care of the patient were reviewed by me and considered in my medical decision making (see chart for details).  Clinical Course as of Jul 07 2043  Morey Hummingbird Jul 06, 2018  1826 Patient has now become agitated uncooperative.  She is not compliant with exam.  I will give her a dose of Geodon.  I will place her under IVC   [JK]  1857 Pt is standing at the window of her room, naked, masturbating starting at the security officer.  Will place in restraints.  Geodon was given.  Will give ativan IM   [JK]    Clinical Course User Index [JK] Linwood Dibbles, MD    Pt presents with bizarre behavior, appears psychotic.  Required restraints, geodon, and ativan.  No evidence of acute medical illness.  Medically cleared  for psychiatric evaluation.  Final Clinical Impressions(s) / ED Diagnoses   Final diagnoses:  Psychosis, unspecified psychosis type (HCC)  Hypersexuality      Linwood Dibbles, MD 07/06/18 2045

## 2018-07-06 NOTE — BHH Counselor (Signed)
Pt was given Geodon and Ativan is is currently sleeping. TTS will assess once pt is alert/roused. TTS will continue to monitor.    Redmond Pulling, MS, [facility], [facility] Triage Specialist [phone]

## 2018-07-06 NOTE — Progress Notes (Signed)
Received Christine Cobb at the change of shift in restraints. She remained in restraints and did not meet the criteria for release until 2115 hrs. She is asleep in the bed and coughing at intervals without distress.the restraints were released at 2115 hrs, she continued to sleep throughout the night.

## 2018-07-06 NOTE — ED Notes (Signed)
Pt is nude and masturbating in front of all the security and staff.  She is not cooperating.  She urinated on the floor.

## 2018-07-06 NOTE — ED Notes (Signed)
Pt is waking up disoriented.  She is refusing to change into scrubs.    She did allow [place] to draw her blood.

## 2018-07-06 NOTE — ED Notes (Signed)
Unable to assess pt at this time. Pt currently in restraints. Pt safe, will continue to monitor.

## 2018-07-07 DIAGNOSIS — F1994 Other psychoactive substance use, unspecified with psychoactive substance-induced mood disorder: Secondary | ICD-10-CM | POA: Diagnosis not present

## 2018-07-07 MED ORDER — ALBUTEROL SULFATE HFA 108 (90 BASE) MCG/ACT IN AERS: 2.0000 | INHALATION_SPRAY | RESPIRATORY_TRACT | 0 refills | Status: DC | PRN

## 2018-07-07 MED ORDER — ALBUTEROL SULFATE HFA 108 (90 BASE) MCG/ACT IN AERS: 2.0000 | INHALATION_SPRAY | RESPIRATORY_TRACT | Status: DC | PRN

## 2018-07-07 NOTE — BH Assessment (Addendum)
[facility] Assessment Progress Note  Per Juanetta Beets, DO, this pt does not require psychiatric hospitalization at this time.  Pt presents under IVC initiated by EDP Linwood Dibbles, MD, which Dr Sharma Covert has rescinded.  Pt is to be discharged from [facility] with recommendation to continue treatment with [facility].  This has been included in pt's discharge instructions.  Pt would also benefit from seeing Peer Support Specialists; they will be asked to speak to pt.  Pt's nurse has been notified.  Doylene Canning, MA Triage Specialist [phone]

## 2018-07-07 NOTE — BH Assessment (Addendum)
Assessment Note  Christine Cobb is an 43 y.o. female that presents this date with IVC. Per IVC: "Respondent has a history of a mental health disorder and was found today at a local store acting bizarre. Respondent was making lewd remarks and then started stripping off her clothing. Respondent is acting very bizarre. Respondent is a danger to herself and others". Patient this date is observed to be very drowsy and states she is unwilling to participate in the assessment. Patient continues to be agitated and argumentative as she informs this Clinical research associate to leave her room. This Clinical research associate informs patient that information needs to be gathered in order to provide care. Patient states "you don't need to know my fu#king business" and covers up her head while in bed. When questioned in reference to SA issues patient states "I use everything" although will not elaborate on history. BAL and UDS is pending this date. Patient denies any S/I, H/I or AVH. Patient when questioned in reference to why she presented this date  stated "I was just high and want to leave." Information to complete assessment was obtained from admission notes and prior history. Per notes, patient arrived by EMS with [employer] following. Patient was found at [facility] acting bizarre. When they asked her to leave she went into another business and started taking off her clothes. Patient then removed a bloody sanitary napkin and started yelling that she needed her vagina checked. Patient presented to the emergency room for bizarre behavior. Patient has a history of bipolar disorder. She also has history of substance abuse. According to the EMS report the patient was at a store and acting bizarrely. Patient then started to make lewd remarks to other people at the store and she started stripping off her clothes. Police and EMS were called. Patient verbally hostile and argumentative. Patient denies suicidal or homicidal ideations. Patient denies any AVH or  paranoia. Staff RN notes patient refused to answer additional questions. Patient is nude and masturbating in front of all the security and staff. She is not cooperating. She urinated on the floor. Per history review patient has presented several times to ED with substance induced mood disorder. Patient denies any current mental health diagnosis. Case was staffed with Alease Frame, [facility] FNP who recommended patient be discharged this date and will be referred to peer support to address SA issues.      Diagnosis: Substance induced mood disorder  Past Medical History:  Past Medical History:  Diagnosis Date  . Arm pain   . Bipolar affective disorder, currently manic, mild (HCC)   . CHF (congestive heart failure) (HCC)   . Eye globe prosthesis   . HTN (hypertension)   . Hyperthyroidism   . Sinus tachycardia     Past Surgical History:  Procedure Laterality Date  . DILATION AND CURETTAGE OF UTERUS    . EYE SURGERY      Family History:  Family History  Problem Relation Age of Onset  . Hypertension Other   . Emphysema Other   . Asthma Son   . Diabetes Maternal Uncle   . Diabetes Paternal Grandmother     Social History:  reports that she has been smoking cigarettes. She has never used smokeless tobacco. She reports current alcohol use. She reports current drug use. Drug: Marijuana.  Additional Social History:  Alcohol / Drug Use Pain Medications: see MAR Prescriptions: see MAR Over the Counter: see MAR History of alcohol / drug use?: Yes Longest period of sobriety (when/how long):  3 years Negative Consequences of Use: (UTA) Withdrawal Symptoms: (UTA) Substance #1 Name of Substance 1: Cocaine per hx 1 - Age of First Use: UTA 1 - Amount (size/oz): UTA 1 - Frequency: UTA 1 - Duration: UTA 1 - Last Use / Amount: UTA  CI WA: CIWA-Ar BP: (too agitated, will attempt later) Pulse Rate: (too agitated, will attempt later) COWS:    Allergies:  Allergies  Allergen Reactions  .  Amoxil [Amoxicillin] Anaphylaxis  . Hydroxyzine     Other reaction(s): Bleeding  . Ketorolac Tromethamine Hives and Swelling  . Prenatal [B-Plex Plus] Nausea Only  . Tegretol [Carbamazepine] Other (See Comments)    Swelling, skin peeling, skin discoloration  . B-Plex Plus Nausea Only    Prenatal   . Tegretol [Carbamazepine] Swelling and Other (See Comments)    Skin peeling Skin discoloring     Home Medications: (Not in a hospital admission)   OB/GYN Status:  No LMP recorded.  General Assessment Data Assessment unable to be completed: Yes Reason for not completing assessment: Pt was given Geodon and Ativan is is currently sleeping. TTS will assess once pt is alert/roused. TTS will continue to monitor.  [facility] of Assessment: [facility] TTS Assessment: In system Is this a Tele or Face-to-Face Assessment?: Face-to-Face Is this an Initial Assessment or a Re-assessment for this encounter?: Initial Assessment Patient Accompanied by:: N/A Language Other than English: No Living Arrangements: Other (Comment)(Friends) What gender do you identify as?: Female Marital status: Single Maiden name: McNeil Pregnancy Status: No Living Arrangements: Non-relatives/Friends Can pt return to current living arrangement?: Yes Admission Status: Involuntary Petitioner: ED Attending Is patient capable of signing voluntary admission?: Yes Referral Source: Self/Family/Friend Insurance type: Medicare  Medical Screening Exam [facility] Walk-in ONLY) Medical Exam completed: Yes  Crisis Care Plan Living Arrangements: Non-relatives/Friends Legal Guardian: (NA) Name of Psychiatrist: None Name of Therapist: None  Education Status Is patient currently in school?: No Is the patient employed, unemployed or receiving disability?: Unemployed  Risk to self with the past 6 months Suicidal Ideation: No Has patient been a risk to self within the past 6 months prior to admission? : No Suicidal Intent: No Has  patient had any suicidal intent within the past 6 months prior to admission? : No Is patient at risk for suicide?: No Suicidal Plan?: No Has patient had any suicidal plan within the past 6 months prior to admission? : No Access to Means: No What has been your use of drugs/alcohol within the last 12 months?: Hx of cocaine use per hx  Previous Attempts/Gestures: No How many times?: (None per hx) Other Self Harm Risks: NA Triggers for Past Attempts: (NA) Intentional Self Injurious Behavior: None Family Suicide History: No Recent stressful life event(s): (UTA) Persecutory voices/beliefs?: No Depression: (UTA) Depression Symptoms: (UTA) Substance abuse history and/or treatment for substance abuse?: No Suicide prevention information given to non-admitted patients: Not applicable  Risk to Others within the past 6 months Homicidal Ideation: No Does patient have any lifetime risk of violence toward others beyond the six months prior to admission? : No Thoughts of Harm to Others: No Current Homicidal Intent: No Current Homicidal Plan: No Access to Homicidal Means: No Identified Victim: NA History of harm to others?: No Assessment of Violence: On admission Violent Behavior Description: Threats to staff on admission Does patient have access to weapons?: No Criminal Charges Pending?: No Does patient have a court date: No Is patient on probation?: No  Psychosis Hallucinations: None noted Delusions: None noted  Mental Status Report Appearance/Hygiene: In scrubs Eye Contact: Unable to Assess Motor Activity: Freedom of movement Speech: Slow, Slurred Level of Consciousness: Drowsy Mood: Angry Affect: Threatening Anxiety Level: Minimal Thought Processes: Unable to Assess Judgement: Unable to Assess Orientation: Unable to assess Obsessive Compulsive Thoughts/Behaviors: Unable to Assess  Cognitive Functioning Concentration: Unable to Assess Memory: Unable to Assess Is patient IDD:  No Insight: Unable to Assess Impulse Control: Unable to Assess Appetite: (UTA) Have you had any weight changes? : (UTA) Sleep: (UTA) Total Hours of Sleep: (UTA) Vegetative Symptoms: Unable to Assess  ADLScreening [facility] Assessment Services) Patient's cognitive ability adequate to safely complete daily activities?: Yes Patient able to express need for assistance with ADLs?: Yes Independently performs ADLs?: Yes (appropriate for developmental age)        ADL Screening (condition at time of admission) Patient's cognitive ability adequate to safely complete daily activities?: Yes Is the patient deaf or have difficulty hearing?: No Does the patient have difficulty seeing, even when wearing glasses/contacts?: No Does the patient have difficulty concentrating, remembering, or making decisions?: No Patient able to express need for assistance with ADLs?: Yes Does the patient have difficulty dressing or bathing?: No Independently performs ADLs?: Yes (appropriate for developmental age) Does the patient have difficulty walking or climbing stairs?: No Weakness of Legs: None Weakness of Arms/Hands: None  Home Assistive Devices/Equipment Home Assistive Devices/Equipment: None  Therapy Consults (therapy consults require a physician order) PT Evaluation Needed: No OT Evalulation Needed: No SLP Evaluation Needed: No Abuse/Neglect Assessment (Assessment to be complete while patient is alone) Physical Abuse: Denies Verbal Abuse: Denies Sexual Abuse: Denies Exploitation of patient/patient's resources: Denies Self-Neglect: Denies Values / Beliefs Cultural Requests During Hospitalization: None Spiritual Requests During Hospitalization: None Consults Spiritual Care Consult Needed: No Social Work Consult Needed: No Merchant navy officer (For Healthcare) Does Patient Have a Medical Advance Directive?: No Would patient like information on creating a medical advance directive?: No - Patient  declined          Disposition: Case was staffed with Beatrix Shipper FNP who recommended patient be discharged this date and will be referred to peer support to address SA issues.       On Site Evaluation by:   Reviewed with Physician:    Alfredia Ferguson 07/07/2018 10:03 AM

## 2018-07-07 NOTE — BHH Suicide Risk Assessment (Signed)
Suicide Risk Assessment  Discharge Assessment   [facility] Discharge Suicide Risk Assessment   Principal Problem: Bipolar affective disorder, manic, severe (HCC) Discharge Diagnoses: Principal Problem:   Bipolar affective disorder, manic, severe (HCC)   Total Time spent with patient: 30 minutes  Musculoskeletal: Strength & Muscle Tone: within normal limits Gait & Station: normal Patient leans: N/A  Psychiatric Specialty Exam:   Blood pressure (!) 129/98, pulse (!) 111, temperature 98.2 F (36.8 C), temperature source Oral, resp. rate 18, SpO2 97 %.There is no height or weight on file to calculate BMI.  General Appearance: Disheveled  Eye Solicitor::  Fair  Speech:  Clear and Coherent409  Volume:  Decreased  Mood:  Irritable and but groggy this morning   Affect:  Congruent  Thought Process:  Coherent, Linear and Descriptions of Associations: Intact  Orientation:  Full (Time, Place, and Person)  Thought Content:  Illogical  Suicidal Thoughts:  No  Homicidal Thoughts:  No  Memory:  Immediate;   Fair Recent;   Fair Remote;   Fair  Judgement:  Fair  Insight:  Shallow  Psychomotor Activity:  Decreased  Concentration:  Fair  Recall:  Fair  Fund of Knowledge:Good  Language: Good  Akathisia:  No  Handed:  Right  AIMS (if indicated):     Assets:  Architect Housing  Sleep:     Cognition: WNL  ADL's:  Intact   Mental Status Per Nursing Assessment::   On Admission:   Pt was highly agitated and irritable upon admission. She has a history of cocaine abuse but was unable to provide a urine sample this admission due to her agitation. Pt was given emergency medications and slept through the night. Pt is alert and awake this morning but still slightly groggy form the medications. Pt has services through [facility]. Pt is able to contract for safety. Pt stated she lives in a house with her boyfriend. Pt is psychiatrically clear.    Demographic Factors:  Low socioeconomic status and Unemployed  Loss Factors: Financial problems/change in socioeconomic status  Historical Factors: Family history of mental illness or substance abuse  Risk Reduction Factors:   Sense of responsibility to family  Continued Clinical Symptoms:  Bipolar Disorder:   Mixed State  Cognitive Features That Contribute To Risk:  Closed-mindedness    Suicide Risk:  Minimal: No identifiable suicidal ideation.  Patients presenting with no risk factors but with morbid ruminations; may be classified as minimal risk based on the severity of the depressive symptoms   Plan Of Care/Follow-up recommendations:  Activity:  as tolerated Diet:  heart Healthy  Laveda Abbe, NP 07/07/2018, 11:54 AM

## 2018-07-07 NOTE — BH Assessment (Signed)
BHH Assessment Progress Note Case was staffed with Alease Frame, [facility] FNP who recommended patient be discharged this date and will be referred to peer support to address SA issues.

## 2018-07-07 NOTE — Patient Outreach (Signed)
[facility]PSS met with the patient to provide substance use recovery support and help with substance use treatment resources. Patient plans to following up with [facility] for their outpatient substance use treatment program. CPSS will also provide information for other outpatient substance use treatment centers as well to follow up with. CPSS already provided [facility] contact information. CPSS strongly encouraged the patient to follow up with [facility]PSS if needed for further help with getting connected to substance use treatment resources after discharge from the [facility].

## 2018-07-07 NOTE — BHH Counselor (Signed)
Clinician spoke to [place], Charity fundraiser and noted pt is currently sleeping. TTS will assess once pt is alert/roused. TTS will continued to monitor.    Redmond Pulling, MS, [facility], [facility] Triage Specialist [phone]

## 2018-07-07 NOTE — ED Notes (Addendum)
Pt discharged with resources.  All belongings were returned to pt. And bus pass was given.  Pt was calm and cooperative.  RX was reviewed.

## 2018-07-07 NOTE — BHH Counselor (Signed)
Clinician observed the pt sleeping. TTS will assess once pt is alert/roused. TTS will continued to monitor.    Redmond Pulling, MS, [facility], [facility] Triage Specialist [phone]

## 2018-07-07 NOTE — BHH Counselor (Signed)
Clinician observed the pt sleeping and checked in with the pt's nurse. TTS will assess once pt is alert/roused. TTSwill continued to monitor. Pt to be assessed during day shift.    Redmond Pulling, MS, [facility], [facility] Triage Specialist [phone]

## 2018-07-07 NOTE — Discharge Instructions (Signed)
For your behavioral health needs, you are advised to continue treatment with [facility]:       [facility]      [address]      [place], [place] [address]      [phone]

## 2018-07-26 ENCOUNTER — Encounter (HOSPITAL_COMMUNITY): Payer: Self-pay | Admitting: Emergency Medicine

## 2018-07-26 ENCOUNTER — Emergency Department (HOSPITAL_COMMUNITY)
Admission: EM | Admit: 2018-07-26 | Discharge: 2018-07-26 | Disposition: A | Discharge status: Home / Self Care | Payer: Medicare Other | Attending: Emergency Medicine | Admitting: Emergency Medicine

## 2018-07-26 ENCOUNTER — Emergency Department (HOSPITAL_COMMUNITY): Payer: Medicare Other

## 2018-07-26 DIAGNOSIS — R05 Cough: Secondary | ICD-10-CM | POA: Diagnosis not present

## 2018-07-26 DIAGNOSIS — Z5321 Procedure and treatment not carried out due to patient leaving prior to being seen by health care provider: Secondary | ICD-10-CM | POA: Insufficient documentation

## 2018-07-26 DIAGNOSIS — R0602 Shortness of breath: Secondary | ICD-10-CM | POA: Insufficient documentation

## 2018-07-26 NOTE — ED Triage Notes (Signed)
Per [facility] pt from home for cough for several weeks and SOB. EMS states pt has long time crack use and requested EKG. Pt told EMS that her Pap smear is due and would like that done today as well.  142/64, 100hr, 24R

## 2018-07-26 NOTE — ED Triage Notes (Signed)
When triaging patient, she is mad that EMS brought her here instead of [facility]. States that she called them and spoke with [facility] and they had beds available for her. Pt needing to go there for her mental health medications.  Informed patient that EMS brought her here due to her c/o SOB and cough to be cleared before she can go to [facility].

## 2018-07-26 NOTE — ED Notes (Signed)
Patient has been called x2 for x-ray

## 2018-08-01 ENCOUNTER — Telehealth: Payer: Self-pay | Admitting: Pulmonary Disease

## 2018-08-01 ENCOUNTER — Encounter: Payer: Self-pay | Admitting: Pulmonary Disease

## 2018-08-01 ENCOUNTER — Ambulatory Visit (INDEPENDENT_AMBULATORY_CARE_PROVIDER_SITE_OTHER): Payer: Medicare HMO | Admitting: Pulmonary Disease

## 2018-08-01 VITALS — BP 124/84 | HR 136 | Ht 64.0 in | Wt 135.0 lb

## 2018-08-01 DIAGNOSIS — J42 Unspecified chronic bronchitis: Secondary | ICD-10-CM | POA: Diagnosis not present

## 2018-08-01 MED ORDER — FUROSEMIDE 20 MG PO TABS: 20.0000 mg | ORAL_TABLET | Freq: Every day | ORAL | 1 refills | Status: DC

## 2018-08-01 MED ORDER — NICOTINE POLACRILEX 2 MG MT GUM: 2.0000 mg | CHEWING_GUM | OROMUCOSAL | 0 refills | Status: DC | PRN

## 2018-08-01 MED ORDER — ALBUTEROL SULFATE (2.5 MG/3ML) 0.083% IN NEBU: 2.5000 mg | INHALATION_SOLUTION | Freq: Four times a day (QID) | RESPIRATORY_TRACT | 3 refills | Status: DC | PRN

## 2018-08-01 NOTE — Progress Notes (Signed)
Christine Cobb    [id number]    Nov 29, 1975  Primary Care Physician:Patient, No Pcp Per  Referring Physician: No referring provider defined for this encounter.  Chief complaint:   Shortness of breath  HPI:  Patient seen today with multiple complaints Very difficult to follow her train of thought  Shortness of breath for which she was recently prescribed albuterol-this is not working well, she will rather have Proventil MDI Was recently told that she may have bronchitis  An active smoker claims she quit about 3 days ago Active marijuana use  She has a cough No chest pains or chest discomfort  She does have a history of congestive heart failure Has had leg swelling she was on Lasix previously, she stated she ran out of her medications  Extensive history of behavioral health problems  Outpatient Encounter Medications as of 08/01/2018  Medication Sig  . albuterol (PROVENTIL HFA;VENTOLIN HFA) 108 (90 Base) MCG/ACT inhaler Inhale 2 puffs into the lungs every 4 (four) hours as needed for wheezing or shortness of breath.  Marland Kitchen amantadine (SYMMETREL) 100 MG capsule Take 1 capsule (100 mg total) 2 (two) times daily by mouth.  . ARIPiprazole (ABILIFY) 10 MG tablet Take 1 tablet (10 mg total) daily by mouth.  . hydrOXYzine (ATARAX/VISTARIL) 50 MG tablet Take 1 tablet (50 mg total) 3 (three) times daily as needed by mouth for anxiety.  . mirtazapine (REMERON) 15 MG tablet Take 1 tablet (15 mg total) at bedtime by mouth.  . nicotine polacrilex (NICORETTE) 2 MG gum Take 1 each (2 mg total) as needed by mouth for smoking cessation.   No facility-administered encounter medications on file as of 08/01/2018.     Allergies as of 08/01/2018 - Review Complete 08/01/2018  Allergen Reaction Noted  . Amoxil [amoxicillin] Anaphylaxis 01/01/2013  . Hydroxyzine  06/05/2013  . Ketorolac tromethamine Hives and Swelling 04/08/2011  . Prenatal [b-plex plus] Nausea Only 04/10/2013  .  Tegretol [carbamazepine] Other (See Comments) 03/06/2012  . B-plex plus Nausea Only 04/21/2016  . Tegretol [carbamazepine] Swelling and Other (See Comments) 04/21/2016    Past Medical History:  Diagnosis Date  . Arm pain   . Bipolar affective disorder, currently manic, mild (HCC)   . CHF (congestive heart failure) (HCC)   . Eye globe prosthesis   . HTN (hypertension)   . Hyperthyroidism   . Sinus tachycardia     Past Surgical History:  Procedure Laterality Date  . DILATION AND CURETTAGE OF UTERUS    . EYE SURGERY      Family History  Problem Relation Age of Onset  . Hypertension Other   . Emphysema Other   . Asthma Son   . Diabetes Maternal Uncle   . Diabetes Paternal Grandmother     Social History   Socioeconomic History  . Marital status: Married    Spouse name: Not on file  . Number of children: Not on file  . Years of education: Not on file  . Highest education level: Not on file  Occupational History  . Not on file  Social Needs  . Financial resource strain: Not on file  . Food insecurity:    Worry: Not on file    Inability: Not on file  . Transportation needs:    Medical: Not on file    Non-medical: Not on file  Tobacco Use  . Smoking status: Current Every Day Smoker    Types: Cigarettes    Last attempt  to quit: 08/21/2013    Years since quitting: 4.9  . Smokeless tobacco: Never Used  Substance and Sexual Activity  . Alcohol use: Yes    Comment: Last drink: 1/2 beer PTA  . Drug use: Yes    Types: Marijuana    Comment: Pt sts "anything and everything"  . Sexual activity: Yes    Birth control/protection: Injection    Comment: crack cocaine  Lifestyle  . Physical activity:    Days per week: Not on file    Minutes per session: Not on file  . Stress: Not on file  Relationships  . Social connections:    Talks on phone: Not on file    Gets together: Not on file    Attends religious service: Not on file    Active member of club or organization:  Not on file    Attends meetings of clubs or organizations: Not on file    Relationship status: Not on file  . Intimate partner violence:    Fear of current or ex partner: Not on file    Emotionally abused: Not on file    Physically abused: Not on file    Forced sexual activity: Not on file  Other Topics Concern  . Not on file  Social History Narrative  . Not on file    Review of Systems  HENT: Negative.   Eyes: Negative.   Respiratory: Positive for cough and shortness of breath.   Cardiovascular: Positive for leg swelling.  Gastrointestinal: Negative.   Endocrine: Negative.   Genitourinary: Negative.     Vitals:   08/01/18 1524 08/01/18 1526  BP:  124/84  Pulse: (!) 138 (!) 136  SpO2: 98% 98%     Physical Exam  Constitutional: She appears well-developed and well-nourished.  HENT:  Head: Normocephalic and atraumatic.  Eyes: Pupils are equal, round, and reactive to light. Conjunctivae and EOM are normal. Right eye exhibits no discharge. Left eye exhibits no discharge.  Neck: Normal range of motion. Neck supple. No tracheal deviation present. No thyromegaly present.  Cardiovascular: Normal rate and regular rhythm.  Pulmonary/Chest: Effort normal and breath sounds normal. No respiratory distress. She has no wheezes. She has no rales.  Abdominal: Soft. Bowel sounds are normal. She exhibits no distension. There is no abdominal tenderness. There is no rebound.  Musculoskeletal:        General: Edema present.   Assessment:   Obstructive lung disease -Continues to smoke actively  Bronchitis  Congestive heart failure  Shortness of breath -Multifactorial  Bipolar disorder  Active smoker   Plan/Recommendations:  Prescription for nebulizer with albuterol use  We will provide her with a prescription for Proventil in place of albuterol which she states is not working for her  Nicorette gum prescription  Lasix 20 mg daily  Encouraged to follow-up with her  primary doctor with respect to her heart failure  I will tentatively see her back in 3 months  Virl Diamond MD [facility] 08/01/2018, 3:30 PM  CC: No ref. provider found

## 2018-08-01 NOTE — Patient Instructions (Signed)
Shortness of breath Multiple symptomatology  Proventil Nebulizer with albuterol Lasix 20 mg p.o. daily  I will see you back in the office in 3 months

## 2018-08-01 NOTE — Telephone Encounter (Signed)
Patient called answering service  Called the number that the patient left [phone] was no response I did place a call to the number she has listed on record [phone]  Encouraged her to continue using albuterol MDI Call the office tomorrow to find out about the nebulizer Prescription was placed today

## 2018-08-02 ENCOUNTER — Telehealth: Payer: Self-pay | Admitting: Pulmonary Disease

## 2018-08-02 NOTE — Telephone Encounter (Signed)
The rx for albuterol neb was sent to [facility] and neb machine ordered through [facility]   [facility]

## 2018-08-03 NOTE — Telephone Encounter (Signed)
lmtcb for pt.  

## 2018-08-07 NOTE — Telephone Encounter (Signed)
Called and left message for Patient to call back. 

## 2018-08-08 ENCOUNTER — Telehealth: Payer: Self-pay | Admitting: Pulmonary Disease

## 2018-08-08 NOTE — Telephone Encounter (Signed)
lmom x1 

## 2018-08-08 NOTE — Telephone Encounter (Signed)
Called patient, unable to reach left message to give us a call back. Per protocol will close encounter.  

## 2018-08-09 ENCOUNTER — Ambulatory Visit (INDEPENDENT_AMBULATORY_CARE_PROVIDER_SITE_OTHER): Payer: Medicare HMO | Admitting: Nurse Practitioner

## 2018-08-09 ENCOUNTER — Encounter: Payer: Self-pay | Admitting: Nurse Practitioner

## 2018-08-09 ENCOUNTER — Ambulatory Visit (INDEPENDENT_AMBULATORY_CARE_PROVIDER_SITE_OTHER)
Admission: RE | Admit: 2018-08-09 | Discharge: 2018-08-09 | Disposition: A | Discharge status: Home / Self Care | Payer: Medicare HMO | Source: Ambulatory Visit | Attending: Nurse Practitioner | Admitting: Nurse Practitioner

## 2018-08-09 ENCOUNTER — Inpatient Hospital Stay (HOSPITAL_COMMUNITY)
Admission: AD | Admit: 2018-08-09 | Discharge: 2018-08-17 | DRG: 291 | Disposition: A | Discharge status: Home Health | Payer: Medicare HMO | Source: Ambulatory Visit | Attending: Internal Medicine | Admitting: Internal Medicine

## 2018-08-09 ENCOUNTER — Telehealth: Payer: Self-pay | Admitting: Pulmonary Disease

## 2018-08-09 VITALS — BP 112/72 | Ht 64.0 in | Wt 129.8 lb

## 2018-08-09 DIAGNOSIS — Z8249 Family history of ischemic heart disease and other diseases of the circulatory system: Secondary | ICD-10-CM | POA: Diagnosis not present

## 2018-08-09 DIAGNOSIS — I248 Other forms of acute ischemic heart disease: Secondary | ICD-10-CM | POA: Diagnosis not present

## 2018-08-09 DIAGNOSIS — R64 Cachexia: Secondary | ICD-10-CM | POA: Diagnosis present

## 2018-08-09 DIAGNOSIS — I361 Nonrheumatic tricuspid (valve) insufficiency: Secondary | ICD-10-CM | POA: Diagnosis not present

## 2018-08-09 DIAGNOSIS — J189 Pneumonia, unspecified organism: Secondary | ICD-10-CM

## 2018-08-09 DIAGNOSIS — Z720 Tobacco use: Secondary | ICD-10-CM | POA: Diagnosis not present

## 2018-08-09 DIAGNOSIS — I371 Nonrheumatic pulmonary valve insufficiency: Secondary | ICD-10-CM | POA: Diagnosis present

## 2018-08-09 DIAGNOSIS — J9601 Acute respiratory failure with hypoxia: Secondary | ICD-10-CM | POA: Diagnosis not present

## 2018-08-09 DIAGNOSIS — R042 Hemoptysis: Secondary | ICD-10-CM | POA: Diagnosis present

## 2018-08-09 DIAGNOSIS — J9621 Acute and chronic respiratory failure with hypoxia: Secondary | ICD-10-CM | POA: Diagnosis present

## 2018-08-09 DIAGNOSIS — I5021 Acute systolic (congestive) heart failure: Secondary | ICD-10-CM | POA: Diagnosis not present

## 2018-08-09 DIAGNOSIS — J181 Lobar pneumonia, unspecified organism: Secondary | ICD-10-CM | POA: Diagnosis not present

## 2018-08-09 DIAGNOSIS — N179 Acute kidney failure, unspecified: Secondary | ICD-10-CM | POA: Diagnosis not present

## 2018-08-09 DIAGNOSIS — J9690 Respiratory failure, unspecified, unspecified whether with hypoxia or hypercapnia: Secondary | ICD-10-CM

## 2018-08-09 DIAGNOSIS — F1410 Cocaine abuse, uncomplicated: Secondary | ICD-10-CM | POA: Diagnosis not present

## 2018-08-09 DIAGNOSIS — J90 Pleural effusion, not elsewhere classified: Secondary | ICD-10-CM | POA: Diagnosis not present

## 2018-08-09 DIAGNOSIS — E440 Moderate protein-calorie malnutrition: Secondary | ICD-10-CM | POA: Diagnosis present

## 2018-08-09 DIAGNOSIS — E0590 Thyrotoxicosis, unspecified without thyrotoxic crisis or storm: Secondary | ICD-10-CM | POA: Diagnosis present

## 2018-08-09 DIAGNOSIS — Z888 Allergy status to other drugs, medicaments and biological substances status: Secondary | ICD-10-CM

## 2018-08-09 DIAGNOSIS — Z825 Family history of asthma and other chronic lower respiratory diseases: Secondary | ICD-10-CM | POA: Diagnosis not present

## 2018-08-09 DIAGNOSIS — R079 Chest pain, unspecified: Secondary | ICD-10-CM | POA: Diagnosis not present

## 2018-08-09 DIAGNOSIS — E1122 Type 2 diabetes mellitus with diabetic chronic kidney disease: Secondary | ICD-10-CM | POA: Diagnosis present

## 2018-08-09 DIAGNOSIS — J440 Chronic obstructive pulmonary disease with acute lower respiratory infection: Secondary | ICD-10-CM | POA: Diagnosis present

## 2018-08-09 DIAGNOSIS — Z881 Allergy status to other antibiotic agents status: Secondary | ICD-10-CM

## 2018-08-09 DIAGNOSIS — I071 Rheumatic tricuspid insufficiency: Secondary | ICD-10-CM | POA: Diagnosis present

## 2018-08-09 DIAGNOSIS — F17210 Nicotine dependence, cigarettes, uncomplicated: Secondary | ICD-10-CM | POA: Diagnosis present

## 2018-08-09 DIAGNOSIS — Z6820 Body mass index (BMI) 20.0-20.9, adult: Secondary | ICD-10-CM

## 2018-08-09 DIAGNOSIS — F250 Schizoaffective disorder, bipolar type: Secondary | ICD-10-CM | POA: Diagnosis present

## 2018-08-09 DIAGNOSIS — I5043 Acute on chronic combined systolic (congestive) and diastolic (congestive) heart failure: Secondary | ICD-10-CM | POA: Diagnosis present

## 2018-08-09 DIAGNOSIS — I130 Hypertensive heart and chronic kidney disease with heart failure and stage 1 through stage 4 chronic kidney disease, or unspecified chronic kidney disease: Secondary | ICD-10-CM | POA: Diagnosis present

## 2018-08-09 DIAGNOSIS — Z970 Presence of artificial eye: Secondary | ICD-10-CM | POA: Diagnosis not present

## 2018-08-09 DIAGNOSIS — N183 Chronic kidney disease, stage 3 (moderate): Secondary | ICD-10-CM | POA: Diagnosis present

## 2018-08-09 DIAGNOSIS — I340 Nonrheumatic mitral (valve) insufficiency: Secondary | ICD-10-CM | POA: Diagnosis not present

## 2018-08-09 DIAGNOSIS — I272 Pulmonary hypertension, unspecified: Secondary | ICD-10-CM | POA: Diagnosis present

## 2018-08-09 DIAGNOSIS — I471 Supraventricular tachycardia: Secondary | ICD-10-CM | POA: Diagnosis not present

## 2018-08-09 DIAGNOSIS — R0602 Shortness of breath: Secondary | ICD-10-CM

## 2018-08-09 DIAGNOSIS — Z8679 Personal history of other diseases of the circulatory system: Secondary | ICD-10-CM

## 2018-08-09 DIAGNOSIS — I429 Cardiomyopathy, unspecified: Secondary | ICD-10-CM | POA: Diagnosis not present

## 2018-08-09 LAB — CBC WITH DIFFERENTIAL/PLATELET
Abs Immature Granulocytes: 0.04 10*3/uL (ref 0.00–0.07)
Basophils Absolute: 0.1 10*3/uL (ref 0.0–0.1)
Basophils Relative: 1 %
Eosinophils Absolute: 0.1 10*3/uL (ref 0.0–0.5)
Eosinophils Relative: 2 %
HCT: 39.2 % (ref 36.0–46.0)
HEMOGLOBIN: 12 g/dL (ref 12.0–15.0)
Immature Granulocytes: 1 %
LYMPHS PCT: 30 %
Lymphs Abs: 1.8 10*3/uL (ref 0.7–4.0)
MCH: 24.8 pg — ABNORMAL LOW (ref 26.0–34.0)
MCHC: 30.6 g/dL (ref 30.0–36.0)
MCV: 81 fL (ref 80.0–100.0)
Monocytes Absolute: 0.4 10*3/uL (ref 0.1–1.0)
Monocytes Relative: 7 %
Neutro Abs: 3.5 10*3/uL (ref 1.7–7.7)
Neutrophils Relative %: 59 %
Platelets: 307 10*3/uL (ref 150–400)
RBC: 4.84 MIL/uL (ref 3.87–5.11)
RDW: 15.9 % — ABNORMAL HIGH (ref 11.5–15.5)
WBC: 6 10*3/uL (ref 4.0–10.5)
nRBC: 0 % (ref 0.0–0.2)

## 2018-08-09 LAB — HEMOGLOBIN A1C
Hgb A1c MFr Bld: 5.8 % — ABNORMAL HIGH (ref 4.8–5.6)
Mean Plasma Glucose: 119.76 mg/dL

## 2018-08-09 LAB — BASIC METABOLIC PANEL
Anion gap: 12 (ref 5–15)
BUN: 18 mg/dL (ref 6–20)
CO2: 23 mmol/L (ref 22–32)
Calcium: 8.4 mg/dL — ABNORMAL LOW (ref 8.9–10.3)
Chloride: 102 mmol/L (ref 98–111)
Creatinine, Ser: 1.34 mg/dL — ABNORMAL HIGH (ref 0.44–1.00)
GFR calc Af Amer: 57 mL/min — ABNORMAL LOW (ref >60)
GFR calc non Af Amer: 49 mL/min — ABNORMAL LOW (ref >60)
Glucose, Bld: 91 mg/dL (ref 70–99)
Potassium: 4.1 mmol/L (ref 3.5–5.1)
Sodium: 137 mmol/L (ref 135–145)

## 2018-08-09 LAB — MAGNESIUM: Magnesium: 2 mg/dL (ref 1.7–2.4)

## 2018-08-09 LAB — TSH: TSH: 0.492 u[IU]/mL (ref 0.350–4.500)

## 2018-08-09 LAB — TROPONIN I: TROPONIN I: 0.14 ng/mL — AB (ref <0.03)

## 2018-08-09 LAB — D-DIMER, QUANTITATIVE: D-Dimer, Quant: 4.34 mcg/mL FEU — ABNORMAL HIGH (ref <0.50)

## 2018-08-09 LAB — BRAIN NATRIURETIC PEPTIDE: B Natriuretic Peptide: 1335.3 pg/mL — ABNORMAL HIGH (ref 0.0–100.0)

## 2018-08-09 MED ORDER — SODIUM CHLORIDE 0.9% FLUSH
3.0000 mL | Freq: Two times a day (BID) | INTRAVENOUS | Status: DC
  Administered 2018-08-10 – 2018-08-16 (×10): 3 mL via INTRAVENOUS

## 2018-08-09 MED ORDER — DOXYCYCLINE HYCLATE 100 MG PO TABS
100.0000 mg | ORAL_TABLET | Freq: Two times a day (BID) | ORAL | Status: DC
  Administered 2018-08-09 – 2018-08-10 (×2): 100 mg via ORAL
  Filled 2018-08-09 (×2): qty 1

## 2018-08-09 MED ORDER — MIRTAZAPINE 15 MG PO TABS
15.0000 mg | ORAL_TABLET | Freq: Every day | ORAL | Status: DC
  Administered 2018-08-10 – 2018-08-16 (×6): 15 mg via ORAL
  Filled 2018-08-09 (×8): qty 1

## 2018-08-09 MED ORDER — ALBUTEROL SULFATE HFA 108 (90 BASE) MCG/ACT IN AERS: 2.0000 | INHALATION_SPRAY | RESPIRATORY_TRACT | 3 refills | Status: DC | PRN

## 2018-08-09 MED ORDER — GUAIFENESIN-DM 100-10 MG/5ML PO SYRP
5.0000 mL | ORAL_SOLUTION | ORAL | Status: DC | PRN
  Administered 2018-08-10: 5 mL via ORAL
  Filled 2018-08-09: qty 5

## 2018-08-09 MED ORDER — SODIUM CHLORIDE 0.9% FLUSH: 3.0000 mL | INTRAVENOUS | Status: DC | PRN

## 2018-08-09 MED ORDER — ARIPIPRAZOLE 2 MG PO TABS
10.0000 mg | ORAL_TABLET | Freq: Every day | ORAL | Status: DC
  Administered 2018-08-09 – 2018-08-17 (×9): 10 mg via ORAL
  Filled 2018-08-09 (×9): qty 5
  Filled 2018-08-09: qty 2

## 2018-08-09 MED ORDER — HYDROXYZINE HCL 10 MG/5ML PO SYRP
25.0000 mg | ORAL_SOLUTION | Freq: Three times a day (TID) | ORAL | Status: DC | PRN
  Filled 2018-08-09: qty 12.5

## 2018-08-09 MED ORDER — SODIUM CHLORIDE 0.9 % IV SOLN: 250.0000 mL | INTRAVENOUS | Status: DC | PRN

## 2018-08-09 MED ORDER — DOXYCYCLINE HYCLATE 100 MG PO TABS: 100.0000 mg | ORAL_TABLET | Freq: Two times a day (BID) | ORAL | 0 refills | Status: DC

## 2018-08-09 MED ORDER — LEVALBUTEROL HCL 0.63 MG/3ML IN NEBU
0.6300 mg | INHALATION_SOLUTION | Freq: Three times a day (TID) | RESPIRATORY_TRACT | Status: DC
  Administered 2018-08-09 (×2): 0.63 mg via RESPIRATORY_TRACT
  Filled 2018-08-09 (×3): qty 3

## 2018-08-09 MED ORDER — SODIUM CHLORIDE 0.9% FLUSH: 3.0000 mL | Freq: Two times a day (BID) | INTRAVENOUS | Status: DC

## 2018-08-09 MED ORDER — SODIUM CHLORIDE 0.9 % IV SOLN
INTRAVENOUS | Status: DC
  Administered 2018-08-10: 06:00:00 via INTRAVENOUS

## 2018-08-09 MED ORDER — HEPARIN SODIUM (PORCINE) 5000 UNIT/ML IJ SOLN
5000.0000 [IU] | Freq: Three times a day (TID) | INTRAMUSCULAR | Status: DC
  Administered 2018-08-09 – 2018-08-10 (×4): 5000 [IU] via SUBCUTANEOUS
  Filled 2018-08-09 (×10): qty 1

## 2018-08-09 MED ORDER — PANTOPRAZOLE SODIUM 20 MG PO TBEC
20.0000 mg | DELAYED_RELEASE_TABLET | Freq: Every day | ORAL | Status: DC
  Administered 2018-08-09 – 2018-08-17 (×9): 20 mg via ORAL
  Filled 2018-08-09 (×9): qty 1

## 2018-08-09 MED ORDER — GUAIFENESIN ER 600 MG PO TB12
1200.0000 mg | ORAL_TABLET | Freq: Two times a day (BID) | ORAL | Status: DC
  Administered 2018-08-09 – 2018-08-17 (×14): 1200 mg via ORAL
  Filled 2018-08-09 (×16): qty 2

## 2018-08-09 MED ORDER — FUROSEMIDE 20 MG PO TABS
20.0000 mg | ORAL_TABLET | Freq: Every day | ORAL | Status: DC
  Administered 2018-08-09: 20 mg via ORAL
  Filled 2018-08-09: qty 1

## 2018-08-09 MED ORDER — NICOTINE POLACRILEX 2 MG MT GUM: 2.0000 mg | CHEWING_GUM | OROMUCOSAL | 0 refills | Status: DC | PRN

## 2018-08-09 NOTE — Progress Notes (Signed)
Pt admitted as progressive status, we do not have appropriate cardiac monitor for pt.  Pt will be transferring to 4e18.  Pt has agreed to get an IV put in and go for CT scan.  Pt also agreed to labs being drawn.  BNP of 1,335 and critical trop of 0.14 called to Dr Molli Knock.  No new orders received and MD notified that pt was going to transfer rooms.  Report called to 4E RN.  Pt transferred via wheelchair with belongings.

## 2018-08-09 NOTE — Telephone Encounter (Signed)
Called patient, unable to reach left message to give [place] a call back. Refer to phone note from 2.5.20 when speaking with patient.

## 2018-08-09 NOTE — Progress Notes (Signed)
Pt refused to get IV catheter ,IV fluid, blood drown for labs and refused CT pulmonary angiogram with and with out contrast which is stat order. Pt stated she maybe agree to do so in the morning. I talked with a staff from CT department to verify and postpone the schedule. She is alert and oriented x 4, agitated when staffs approached. However, she agreed with oral medicines. I notified MD Sonda Rumble, on call pulmonary MD for this situation to make MD aware. As we've known Pt has underlining of psychiatric diseases and substances used. Pt has been coughing productively with white clear sputum. Got a new order for Robitussin PRN. Pt vital signs has been stable with  Sinus tachycardia, HR 110-120, BP stable. We will continue to monitor.  Zackery Barefoot, P[facility]N-[facility]

## 2018-08-09 NOTE — Progress Notes (Addendum)
eLink Physician-Brief Progress Note Patient Name: Christine Cobb DOB: 1975/11/11 MRN: [id number]   Date of Service  08/09/2018  HPI/Events of Note  Patient complaining of cough.  Refusing IV line placement  eICU Interventions  Ordered PRN Robitussin DM     Intervention Category Intermediate Interventions: Medication change / dose adjustment;Communication with other healthcare providers and/or family  Glory Rosebush 08/09/2018, 10:10 PM   12:56 AM Ordered PRN morphine for pain and anxiety.

## 2018-08-09 NOTE — Patient Instructions (Addendum)
Will order chest x ray today Will order d-dimer (shortness of breath and hemoptysis) Will place referral to primary care for patient - follow up for CHF, hypertension, and thyroid Will call with results Continue current medications Follow up with Dr. Wynona Neat as scheduled

## 2018-08-09 NOTE — Assessment & Plan Note (Addendum)
  Patient Instructions  Will order chest x ray today Will order d-dimer (shortness of breath and hemoptysis) Will place referral to primary care for patient - follow up for CHF, hypertension, and thyroid Will call with results Continue current medications Follow up with Dr. Wynona Neat as scheduled    Note: Patient did not leave office after visit due to not having transportation: Chest x-ray in office today was abnormal and d-dimer was elevated at 4.34.  EMS called and patient will be direct admitted to [facility] with CTA ordered.

## 2018-08-09 NOTE — Progress Notes (Signed)
@Patient  ID: Christine Cobb, female    DOB: [birth year], 43 y.o.   MRN: [id number]  Chief Complaint  Patient presents with  . Acute Visit    productive cough with BR "blood clots" mixed with clear mucus, increased SOB    Referring provider: No ref. provider found  HPI  43 year old female current smoker and active marijuana use shortness of breath followed by Dr. Wynona Neat.  PMH includes CHF, bipolar disorder, want to abuse, cocaine abuse with cocaine induced mood disorder, schizoaffective disorder.  Recent significant tests/events:  OV - 08/01/18 - shortness of breath - Dr. Wynona Neat Assessment and plan We will provide her with a prescription for Proventil in place of albuterol which she states is not working for her Nicorette gum prescription Lasix 20 mg daily Encouraged to follow-up with her primary doctor with respect to her heart failure   OV - acute shortness of breath Patient presents today for acute office visit.  She complains today of shortness of breath.  She also complains today of cough and hemoptysis.  She states this is been going on for greater than 1 week now.  She was last seen by Dr. Wynona Neat on 08/01/2018 for shortness of breath.  At that visit she had ran out of her Lasix for her CHF.  Dr. Wynona Neat refilled her Lasix 20 mg daily.  Patient states that since that time her shortness of breath, cough, and hemoptysis have worsened by taking the Lasix.  She denies any recent fever.  Patient has extensive psychiatric history and has been off of her medications recently.  It is very difficult to get an accurate history from patient today her thoughts are very scattered.  She states that her PCP has passed away.  She has not been able to get refills on any of her medications due to this.  Denies any chest pain or edema.  O2 sats in office today are 99% on room air.      Allergies  Allergen Reactions  . Amoxil [Amoxicillin] Anaphylaxis  . Hydroxyzine     Other  reaction(s): Bleeding  . Ketorolac Tromethamine Hives and Swelling  . Prenatal [B-Plex Plus] Nausea Only  . Tegretol [Carbamazepine] Other (See Comments)    Swelling, skin peeling, skin discoloration  . B-Plex Plus Nausea Only    Prenatal   . Tegretol [Carbamazepine] Swelling and Other (See Comments)    Skin peeling Skin discoloring     There is no immunization history for the selected administration types on file for this patient.  Past Medical History:  Diagnosis Date  . Arm pain   . Bipolar affective disorder, currently manic, mild (HCC)   . CHF (congestive heart failure) (HCC)   . Eye globe prosthesis   . HTN (hypertension)   . Hyperthyroidism   . Sinus tachycardia     Tobacco History: Social History   Tobacco Use  Smoking Status Current Every Day Smoker  . Types: Cigarettes  . Last attempt to quit: 08/21/2013  . Years since quitting: 4.9  Smokeless Tobacco Never Used   Ready to quit: Not Answered Counseling given: Not Answered   No facility-administered encounter medications on file as of 08/09/2018.    Outpatient Encounter Medications as of 08/09/2018  Medication Sig  . albuterol (PROVENTIL) (2.5 MG/3ML) 0.083% nebulizer solution Take 3 mLs (2.5 mg total) by nebulization every 6 (six) hours as needed for wheezing or shortness of breath.  Marland Kitchen amantadine (SYMMETREL) 100 MG capsule Take 1 capsule (100 mg total) 2 (  two) times daily by mouth.  . ARIPiprazole (ABILIFY) 10 MG tablet Take 1 tablet (10 mg total) daily by mouth.  . furosemide (LASIX) 20 MG tablet Take 1 tablet (20 mg total) by mouth daily.  . hydrOXYzine (ATARAX/VISTARIL) 50 MG tablet Take 1 tablet (50 mg total) 3 (three) times daily as needed by mouth for anxiety.  . mirtazapine (REMERON) 15 MG tablet Take 1 tablet (15 mg total) at bedtime by mouth.  . nicotine polacrilex (NICORETTE) 2 MG gum Take 1 each (2 mg total) by mouth as needed for smoking cessation.  . [DISCONTINUED] nicotine polacrilex (NICORETTE)  2 MG gum Take 1 each (2 mg total) by mouth as needed for smoking cessation.  Marland Kitchen albuterol (PROVENTIL HFA;VENTOLIN HFA) 108 (90 Base) MCG/ACT inhaler Inhale 2 puffs into the lungs every 4 (four) hours as needed for wheezing or shortness of breath.  . doxycycline (VIBRA-TABS) 100 MG tablet Take 1 tablet (100 mg total) by mouth 2 (two) times daily.  . [DISCONTINUED] albuterol (PROVENTIL HFA;VENTOLIN HFA) 108 (90 Base) MCG/ACT inhaler Inhale 2 puffs into the lungs every 4 (four) hours as needed for wheezing or shortness of breath. (Patient not taking: Reported on 08/09/2018)     Review of Systems  Review of Systems  Constitutional: Negative.  Negative for chills and fever.  HENT: Negative.   Respiratory: Positive for cough (with hemoptysis) and shortness of breath.   Cardiovascular: Negative.  Negative for chest pain, palpitations and leg swelling.  Gastrointestinal: Negative.   Allergic/Immunologic: Negative.   Neurological: Negative.   Psychiatric/Behavioral: Negative.        Physical Exam  BP 112/72 (BP Location: Left Arm, Cuff Size: Normal)   Ht 5\' 4"  (1.626 m)   Wt 129 lb 12.8 oz (58.9 kg)   LMP 07/12/2018   SpO2 99%   BMI 22.28 kg/m   Wt Readings from Last 5 Encounters:  08/09/18 129 lb 12.8 oz (58.9 kg)  08/01/18 135 lb (61.2 kg)  05/08/17 139 lb (63 kg)  08/03/16 150 lb (68 kg)  05/05/16 160 lb (72.6 kg)     Physical Exam Vitals signs and nursing note reviewed.  Constitutional:      General: She is not in acute distress.    Appearance: She is well-developed.  Cardiovascular:     Rate and Rhythm: Normal rate and regular rhythm.  Pulmonary:     Effort: Pulmonary effort is normal.     Breath sounds: Normal breath sounds. No wheezing or rhonchi.     Comments: Harsh cough productive of clear sputum with tinge of hemoptysis noted while in office. Musculoskeletal:        General: No swelling.  Neurological:     Mental Status: She is alert and oriented to person,  place, and time.      Lab Results:  Imaging: Dg Chest 2 View  Result Date: 08/09/2018 CLINICAL DATA:  Coughing up blood for 1.5 months.  Right-sided pain. EXAM: CHEST - 2 VIEW COMPARISON:  None. FINDINGS: Airspace disease at the right base with small pleural effusion. Borderline heart size. Clear left lung. No acute osseous finding. These results will be called to the ordering clinician or representative by the Radiologist Assistant, and communication documented in the PACS or zVision Dashboard. IMPRESSION: 1. Airspace opacity in the right lower lobe with small pleural effusion. In the setting of rib pain and hemoptysis, pneumonia, lung infarct, or mass are considerations. 2. Cardiomegaly without failure. Electronically Signed   By: Kathrynn Ducking.D.  On: 08/09/2018 12:06     Assessment & Plan:   Hemoptysis  Patient Instructions  Will order chest x ray today Will order d-dimer (shortness of breath and hemoptysis) Will place referral to primary care for patient - follow up for CHF, hypertension, and thyroid Will call with results Continue current medications Follow up with Dr. Wynona Neat as scheduled    Note: Patient did not leave office after visit due to not having transportation: Chest x-ray in office today was abnormal and d-dimer was elevated at 4.34.  EMS called and patient will be direct admitted to [facility] with CTA ordered.      Ivonne Andrew, NP 08/09/2018

## 2018-08-09 NOTE — Progress Notes (Signed)
Admitted to 4E, VSS, CCMD notified, and CHG bath given. Critical labs already called to MD by prior RN. Will continue to monitor.

## 2018-08-09 NOTE — H&P (Addendum)
NAME:  Christine Cobb, MRN:  [id number], DOB:  04-16-1976, LOS: 0 ADMISSION DATE: 08/09/2018, CONSULTATION DATE:  08/09/2018 REFERRING MD: Dr. Wynona Neat, CHIEF COMPLAINT:  Hemoptysis, Dyspnea   Brief History   43 year old female current every day smoker with history of COPD, CHF, HTN, Hyperthyroid, and bipolar mood disorder, schizoid affective disorder with polysubstance abuse presented to the pulmonary office 08/09/2018 with cough,  hemoptysis, dyspnea and a d dimer of 4.34. She was sent to [facility] hospital via EMS for Direct admit by PCCM.  History of present illness   43 year old female current every day smoker with history of COPD, CHF, HTN, Hyperthyroid, and bipolar mood disorder, schizoid affective disorder with polysubstance abuse presented to the pulmonary office 08/09/2018 with cough hemoptysis, dyspnea and a d dimer of 4.34. She was sent to [facility] hospital via EMS for Direct admit by PCCM.She was originaly seen by Dr. Wynona Neat on 08/01/2018. He re-started her lasix and her nebulized Proventil. She returned to the office 08/09/2018 with worsening dyspnea, cough, hemoptysis. D dimer was elevated at 4.34. She endorses decreased mobility as she has not been feeling well. She denies any recent fever, but CXR indicated Airspace opacity in the right lower lobe with small pleural effusion. In the setting of rib pain and hemoptysis, pneumonia, lung infarct, or mass are considerations.She is a poor historian,due to extensive psychiatric diagnoses and was unable to give many details. She was staffed by Dr. Wynona Neat, who opted for admission . She will be pre admitted to 2W 01 with PCCM as Primary.    Past Medical History   Past Medical History:  Diagnosis Date  . Arm pain   . Bipolar affective disorder, currently manic, mild (HCC)   . CHF (congestive heart failure) (HCC)   . Eye globe prosthesis   . HTN (hypertension)   . Hyperthyroidism   . Sinus tachycardia     Significant Hospital Events     08/09/2018 Admission for dyspnea  Consults:    Procedures:  08/09/2018 CTA  Significant Diagnostic Tests:  08/09/2018 CTA   08/09/2018 CTA Airspace opacity in the right lower lobe with small pleural effusion. In the setting of rib pain and hemoptysis, pneumonia, lung infarct, or mass are considerations. Cardiomegaly without failure.  Micro Data:    Antimicrobials:  Doxycycline 08/09/2018>>>   Interim history/subjective:  Pt. Remains short of breath, with cough, and clear sputum with some pink tinged streaks. She is afebrile VSS, HR 110, Sats 99% on Room Air  Objective   Last menstrual period 07/12/2018.       No intake or output data in the 24 hours ending 08/09/18 1519 There were no vitals filed for this visit.  Physical Exam:  General- No distress,  A&Ox3, manic and poor historian ENT: No sinus tenderness, TM clear, pale nasal mucosa, no oral exudate,no post nasal drip, no LAN Cardiac: S1, S2, regular rate and rhythm, no murmur, tachy Chest: No wheeze/ rales/ dullness; no accessory muscle use, no nasal flaring, no sternal retractions, harsh cough with hemoptysis Abd.: Soft Non-tender, ND, NT,  Ext: No clubbing cyanosis, edema, no obvious deformities Neuro:  normal strength, MAE x 4, A&O x 3, follows commands Skin: No rashes, warm and dry Psych: manic, crying at times, then angry, then laughing.   Resolved Hospital Problem list     Assessment & Plan:    Dyspnea with hemoptysis Concern for PE D dimer 4.34 Plan: CTA ( ordered) If + for PE will need heparin per  pharmacy 2 D echo ( Ordered ) to evaluate for right heart strain Tele monitoring CBC Titrate oxygen for sats > 92% CXR  2/13 am Trend Troponins 12 Lead EKG now and in am  Acute on Chronic Respiratory Failure  ? Infiltrate RLL per chest imaging COPD Plan Doxycycline 100 mg BID x 7 days Follow up imaging Aggressive pulmonary toilet IS Q 1 while awake Flutter valve Mucinex 1200 mg  BID OOB Mobilize as able Oxygen to maintain sats of > 92 % Scheduled BD ( Xopenex) Trend fever curve and WBC Trend CXR Urine Strep Urine Legionella  Stage 3 Chronic Kidney Disease Creatinine 1.35 Plan Trend BMET and Urine output Replete electrolytes as needed Check and replete mag IVF at 75/ hr as CTA with contrast  History of CHF Last Echo 2012>> EF 55%, normal wall motion Plan Echo Trend BNP CXR Continue Lasix 20 mg daily Strict I&O   Endocrine Hyperthyroid Plan: Trend TSH CBG ac/hs SSI if indicated HGB A1C  BiPolar Disease History of substance abuse Plan Continue Abilify as ordered Consider psych consult Case management consult Social Work consult Urine Drug Screen   Best practice:  Diet: NPO Pain/Anxiety/Delirium protocol (if indicated): NA VAP protocol (if indicated): NA DVT prophylaxis: Heparin GI prophylaxis: Protonix Glucose control: CBG/ SSI Mobility: OOB to BR Code Status: Full Family Communication: No family at bedside, patient updated Disposition: Progressive 2W  Labs   CBC: No results for input(s): WBC, NEUTROABS, HGB, HCT, MCV, PLT in the last 168 hours.  Basic Metabolic Panel: No results for input(s): NA, K, CL, CO2, GLUCOSE, BUN, CREATININE, CALCIUM, MG, PHOS in the last 168 hours. GFR: CrCl cannot be calculated (Patient's most recent lab result is older than the maximum 21 days allowed.). No results for input(s): PROCALCITON, WBC, LATICACIDVEN in the last 168 hours.  Liver Function Tests: No results for input(s): AST, ALT, ALKPHOS, BILITOT, PROT, ALBUMIN in the last 168 hours. No results for input(s): LIPASE, AMYLASE in the last 168 hours. No results for input(s): AMMONIA in the last 168 hours.  ABG    Component Value Date/Time   HCO3 18.7 (L) 05/17/2013 1505   TCO2 22 10/22/2016 0923   ACIDBASEDEF 5.8 (H) 05/17/2013 1505   O2SAT 55.8 05/17/2013 1505     Coagulation Profile: No results for input(s): INR, PROTIME in the  last 168 hours.  Cardiac Enzymes: No results for input(s): CKTOTAL, CKMB, CKMBINDEX, TROPONINI in the last 168 hours.  HbA1C: Hgb A1c MFr Bld  Date/Time Value Ref Range Status  12/21/2014 07:00 AM 5.4 4.8 - 5.6 % Final    Comment:    (NOTE)         Pre-diabetes: 5.7 - 6.4         Diabetes: >6.4         Glycemic control for adults with diabetes: <7.0     CBG: No results for input(s): GLUCAP in the last 168 hours.  Review of Systems:   Constitutional: Negative.  Negative for chills and fever.  HENT: Negative.   Respiratory: Positive for cough (with hemoptysis) and shortness of breath.   Cardiovascular: Negative.  Negative for chest pain, palpitations and leg swelling.  Gastrointestinal: Negative.   Allergic/Immunologic: Negative.   Neurological: Negative.   Psychiatric/Behavioral: Negative.    Past Medical History  She,  has a past medical history of Arm pain, Bipolar affective disorder, currently manic, mild (HCC), CHF (congestive heart failure) (HCC), Eye globe prosthesis, HTN (hypertension), Hyperthyroidism, and Sinus tachycardia.   Surgical  History    Past Surgical History:  Procedure Laterality Date  . DILATION AND CURETTAGE OF UTERUS    . EYE SURGERY       Social History   reports that she has been smoking cigarettes. She has never used smokeless tobacco. She reports current alcohol use. She reports current drug use. Drug: Marijuana.   Family History   Her family history includes Asthma in her son; Diabetes in her maternal uncle and paternal grandmother; Emphysema in an other family member; Hypertension in an other family member.   Allergies Allergies  Allergen Reactions  . Amoxil [Amoxicillin] Anaphylaxis  . Hydroxyzine     Other reaction(s): Bleeding  . Ketorolac Tromethamine Hives and Swelling  . Prenatal [B-Plex Plus] Nausea Only  . Tegretol [Carbamazepine] Other (See Comments)    Swelling, skin peeling, skin discoloration  . B-Plex Plus Nausea Only     Prenatal   . Tegretol [Carbamazepine] Swelling and Other (See Comments)    Skin peeling Skin discoloring      Home Medications  Prior to Admission medications   Medication Sig Start Date End Date Taking? Authorizing Provider  albuterol (PROVENTIL HFA;VENTOLIN HFA) 108 (90 Base) MCG/ACT inhaler Inhale 2 puffs into the lungs every 4 (four) hours as needed for wheezing or shortness of breath. 08/09/18   Ivonne AndrewNichols, Tonya S, NP  albuterol (PROVENTIL) (2.5 MG/3ML) 0.083% nebulizer solution Take 3 mLs (2.5 mg total) by nebulization every 6 (six) hours as needed for wheezing or shortness of breath. 08/01/18   Tomma Lightninglalere, Adewale A, MD  amantadine (SYMMETREL) 100 MG capsule Take 1 capsule (100 mg total) 2 (two) times daily by mouth. 05/09/17   Oneta RackLewis, Tanika N, NP  ARIPiprazole (ABILIFY) 10 MG tablet Take 1 tablet (10 mg total) daily by mouth. 05/10/17   Oneta RackLewis, Tanika N, NP  doxycycline (VIBRA-TABS) 100 MG tablet Take 1 tablet (100 mg total) by mouth 2 (two) times daily. 08/09/18   Ivonne AndrewNichols, Tonya S, NP  furosemide (LASIX) 20 MG tablet Take 1 tablet (20 mg total) by mouth daily. 08/01/18   Tomma Lightninglalere, Adewale A, MD  hydrOXYzine (ATARAX/VISTARIL) 50 MG tablet Take 1 tablet (50 mg total) 3 (three) times daily as needed by mouth for anxiety. 05/09/17   Oneta RackLewis, Tanika N, NP  mirtazapine (REMERON) 15 MG tablet Take 1 tablet (15 mg total) at bedtime by mouth. 05/09/17   Oneta RackLewis, Tanika N, NP  nicotine polacrilex (NICORETTE) 2 MG gum Take 1 each (2 mg total) by mouth as needed for smoking cessation. 08/09/18   Tomma Lightninglalere, Adewale A, MD         Bevelyn NgoSarah F. Groce, AGACNP-BC [facility] Pulmonary/Critical Care Medicine Pager # [phone] After 4 pm call [phone] 08/09/2018 3:58 PM  Attending Note:  43 year old female with PMH of bipolar disorder and asthma who presents with CP, cough, hemoptysis and rib pain.  On exam, lungs with bibasilar crackles noted.  I reviewed CXR myself, RLL infiltrate and pleural effusion noted.   Discussed with PCCM-NP.  CP: pleurisy from CAP vs PE (infarct)  - Anti-inflammatories  - Monitor clinically  ?PE:  - Heparin SQ  - CTA  - If CTA is positive will need to change to IV drip  CAP:  - Pan culture  - Doxy  Hemoptysis:  - Monitor clinically  - If worse the will consider bronchoscopy but now now  PCCM will admit and follow  Patient seen and examined, agree with above note.  I dictated the care and orders written for  this patient under my direction.  Alyson Reedy, M.D. [facility] Pulmonary/Critical Care Medicine. Pager: [phone]. After hours pager: [phone].

## 2018-08-09 NOTE — Progress Notes (Signed)
New admit arrived at 1545 via ambulance as a direct admit.  Pt alert and oriented x4.  Refusing IV and heart monitor because she is NPO currently.  Paged admitting MD and they are working on orders.

## 2018-08-09 NOTE — Telephone Encounter (Signed)
Called spoke with Marcelino Duster at [facility] APPs are working on the admission orders as we speak Marcelino Duster voiced her understanding Nothing further needed; will sign off

## 2018-08-09 NOTE — Progress Notes (Signed)
After speaking with patient and primary RN, RN will place IV team consult in the morning.

## 2018-08-10 ENCOUNTER — Inpatient Hospital Stay (HOSPITAL_COMMUNITY): Payer: Medicare HMO

## 2018-08-10 ENCOUNTER — Other Ambulatory Visit: Payer: Self-pay

## 2018-08-10 DIAGNOSIS — I340 Nonrheumatic mitral (valve) insufficiency: Secondary | ICD-10-CM

## 2018-08-10 DIAGNOSIS — I272 Pulmonary hypertension, unspecified: Secondary | ICD-10-CM

## 2018-08-10 DIAGNOSIS — J90 Pleural effusion, not elsewhere classified: Secondary | ICD-10-CM

## 2018-08-10 DIAGNOSIS — I361 Nonrheumatic tricuspid (valve) insufficiency: Secondary | ICD-10-CM

## 2018-08-10 LAB — MAGNESIUM: Magnesium: 1.9 mg/dL (ref 1.7–2.4)

## 2018-08-10 LAB — COMPREHENSIVE METABOLIC PANEL
ALT: 22 U/L (ref 0–44)
AST: 24 U/L (ref 15–41)
Albumin: 2.7 g/dL — ABNORMAL LOW (ref 3.5–5.0)
Alkaline Phosphatase: 77 U/L (ref 38–126)
Anion gap: 12 (ref 5–15)
BUN: 18 mg/dL (ref 6–20)
CO2: 22 mmol/L (ref 22–32)
Calcium: 8.4 mg/dL — ABNORMAL LOW (ref 8.9–10.3)
Chloride: 104 mmol/L (ref 98–111)
Creatinine, Ser: 1.41 mg/dL — ABNORMAL HIGH (ref 0.44–1.00)
GFR calc Af Amer: 54 mL/min — ABNORMAL LOW (ref >60)
GFR calc non Af Amer: 46 mL/min — ABNORMAL LOW (ref >60)
Glucose, Bld: 109 mg/dL — ABNORMAL HIGH (ref 70–99)
Potassium: 4.7 mmol/L (ref 3.5–5.1)
Sodium: 138 mmol/L (ref 135–145)
Total Bilirubin: 0.8 mg/dL (ref 0.3–1.2)
Total Protein: 6 g/dL — ABNORMAL LOW (ref 6.5–8.1)

## 2018-08-10 LAB — PHOSPHORUS: Phosphorus: 3.5 mg/dL (ref 2.5–4.6)

## 2018-08-10 LAB — RAPID URINE DRUG SCREEN, HOSP PERFORMED
Amphetamines: NOT DETECTED
Barbiturates: NOT DETECTED
Benzodiazepines: NOT DETECTED
Cocaine: POSITIVE — AB
Opiates: NOT DETECTED
Tetrahydrocannabinol: NOT DETECTED

## 2018-08-10 LAB — CBC
HCT: 36.1 % (ref 36.0–46.0)
Hemoglobin: 11 g/dL — ABNORMAL LOW (ref 12.0–15.0)
MCH: 24.7 pg — ABNORMAL LOW (ref 26.0–34.0)
MCHC: 30.5 g/dL (ref 30.0–36.0)
MCV: 80.9 fL (ref 80.0–100.0)
NRBC: 0 % (ref 0.0–0.2)
Platelets: 312 10*3/uL (ref 150–400)
RBC: 4.46 MIL/uL (ref 3.87–5.11)
RDW: 15.9 % — ABNORMAL HIGH (ref 11.5–15.5)
WBC: 6.5 10*3/uL (ref 4.0–10.5)

## 2018-08-10 LAB — ECHOCARDIOGRAM COMPLETE
Height: 64 in
Weight: 2022.4 oz

## 2018-08-10 LAB — TROPONIN I: Troponin I: 0.15 ng/mL (ref <0.03)

## 2018-08-10 LAB — STREP PNEUMONIAE URINARY ANTIGEN: Strep Pneumo Urinary Antigen: NEGATIVE

## 2018-08-10 LAB — HIV ANTIBODY (ROUTINE TESTING W REFLEX): HIV SCREEN 4TH GENERATION: NONREACTIVE

## 2018-08-10 MED ORDER — LEVALBUTEROL HCL 0.63 MG/3ML IN NEBU
0.6300 mg | INHALATION_SOLUTION | Freq: Four times a day (QID) | RESPIRATORY_TRACT | Status: DC | PRN
  Administered 2018-08-11 – 2018-08-12 (×2): 0.63 mg via RESPIRATORY_TRACT
  Filled 2018-08-10 (×2): qty 3

## 2018-08-10 MED ORDER — FUROSEMIDE 10 MG/ML IJ SOLN
40.0000 mg | Freq: Once | INTRAMUSCULAR | Status: AC
  Administered 2018-08-10: 40 mg via INTRAVENOUS
  Filled 2018-08-10: qty 4

## 2018-08-10 MED ORDER — MORPHINE SULFATE 15 MG PO TABS
15.0000 mg | ORAL_TABLET | ORAL | Status: AC | PRN
  Administered 2018-08-10 – 2018-08-11 (×2): 15 mg via ORAL
  Filled 2018-08-10 (×2): qty 1

## 2018-08-10 MED ORDER — FUROSEMIDE 20 MG PO TABS
20.0000 mg | ORAL_TABLET | Freq: Every day | ORAL | Status: DC
  Administered 2018-08-11: 20 mg via ORAL
  Filled 2018-08-10 (×2): qty 1

## 2018-08-10 MED ORDER — IOPAMIDOL (ISOVUE-370) INJECTION 76%
100.0000 mL | Freq: Once | INTRAVENOUS | Status: AC | PRN
  Administered 2018-08-10: 100 mL via INTRAVENOUS

## 2018-08-10 NOTE — Progress Notes (Signed)
Tachypnea when she is coughing, RR 35 ,at rest, SPO2 93-96%, O2 NCL 2-3  LPM given for her comfort. She had persistent coughing with white and pink tint frothy sputum and complained having pain 5-9/10 scale bilateral lower lungs, auscultated diminish breath sound with rhonchi both basilar, both upper are clear. For her pain, I notified Dr. Sonda Rumble, order received for pain med and Robitussin PRN. Will monitor.  Filiberto Pinks, BSn, RN,PCCN-CMC

## 2018-08-10 NOTE — Care Management Note (Addendum)
Case Management Note Donn Pierini RN, BSN Transitions of Care Unit 4E- RN Case Manager [phone]  Patient Details  Name: Christine Cobb MRN: [id number] Date of Birth: [birth year]  Subjective/Objective:  Pt admitted with SOB, hemoptysis,   Acute on chronic hypoxic resp. Failure with hx COPD                Action/Plan: PTA pt lived at home with spouse (per conversation with pt she is not homeless)- hx of poly substance abuse and psych hx. Pt reports that she has both [employer] and Medicaid insurance with prescription drug coverage. Uses both [employer] and [employer]. She does not have home 02 currently but does endorse having a nebulizer at home. She is followed by [facility] and states she last saw her PCP 2 years ago- Dr. Quitman Livings. CSW also present at bedside and will provide pt with community resources (see CSW note). CM will follow for transition of care needs.   Expected Discharge Date:                  Expected Discharge Plan:  Home/Self Care  In-House Referral:  Clinical Social Work  Discharge planning Services  CM Consult  Post Acute Care Choice:    Choice offered to:     DME Arranged:    DME Agency:     HH Arranged:    HH Agency:     Status of Service:  In process, will continue to follow  If discussed at Long Length of Stay Meetings, dates discussed:    Discharge Disposition:   Additional Comments:  Darrold Span, RN 08/10/2018, 4:39 PM

## 2018-08-10 NOTE — Telephone Encounter (Signed)
Patient is currently admitted- will hold until she is released from hospital for follow up.

## 2018-08-10 NOTE — Progress Notes (Signed)
RT attempted to stick RR and patient jerked arm away and stated she does not want that and refusing to let RT stick again. I explained MD is concerned about her being sleepy and about her breathing. She said "well I did not sleep well last night and I am breathing fine with my oxygen". RT asked again and she stated "just tell them I refuse, I'm not having it".

## 2018-08-10 NOTE — Progress Notes (Signed)
Pt agreed and she got a new IV 20 G left forearm with NSS 75 ml/ hr. Pt agreed to get CT scan this morning.after discussed about the procedure.. I called CT department to set up an appointment . Probably will be available around 08.30 am.   Filiberto Pinks, BSN,RN,PCCN-CMC

## 2018-08-10 NOTE — Progress Notes (Signed)
  Echocardiogram 2D Echocardiogram has been performed.  Gerda Diss 08/10/2018, 1:30 PM

## 2018-08-10 NOTE — Consult Note (Signed)
   [facility] Inpatient Consult   08/10/2018  Madilyne Limbert [birth year] [phone]  Thank you for this consult/referral.  Patient evaluated for [facility] Care Management services.  Patient is not currently a beneficiary of the attributed ACO Registry in the [employer]  Patient has no  Primary Care Provider is not a [facility] primary care provider or is not [facility] affiliated.   This patient is Not eligible for [facility] Care Management Services.   Reason:  Not a beneficiary currently attributed to one of the [facility] ACO Registry populations.  Membership roster was used to verify non- eligible status.  Charlesetta Shanks, RN BSN CCM [facility]  [phone] business mobile phone Toll free office [phone]

## 2018-08-10 NOTE — Progress Notes (Signed)
Responded to PIV consult and made 1 attempt. Pt moved arm during attempt and site blew. Pt refuses further attempt for PIV at this time. RN notified.

## 2018-08-10 NOTE — Progress Notes (Addendum)
NAME:  Christine Cobb, MRN:  [id number], DOB:  1975/11/01, LOS: 1 ADMISSION DATE: 08/09/2018, CONSULTATION DATE:  08/09/2018 REFERRING MD: Dr. Wynona Neatlalere, CHIEF COMPLAINT:  Hemoptysis, Dyspnea   Brief History   43 year old female current every day smoker with history of COPD, CHF, HTN, Hyperthyroid, and bipolar mood disorder, schizoid affective disorder with polysubstance abuse presented to the pulmonary office 08/09/2018 with cough,  hemoptysis, dyspnea and a d dimer of 4.34. She was sent to [facility] via EMS for Direct admit by PCCM.  History of present illness   43 year old female current every day smoker with history of COPD, CHF, HTN, Hyperthyroid, and bipolar mood disorder, schizoid affective disorder with polysubstance abuse presented to the pulmonary office 08/09/2018 with cough hemoptysis, dyspnea and a d dimer of 4.34. She was sent to [facility] via EMS for Direct admit by PCCM.She was originaly seen by Dr. Wynona Neatlalere on 08/01/2018. He re-started her lasix and her nebulized Proventil. She returned to the office 08/09/2018 with worsening dyspnea, cough, hemoptysis. D dimer was elevated at 4.34. She endorses decreased mobility as she has not been feeling well. She denies any recent fever, but CXR indicated Airspace opacity in the right lower lobe with small pleural effusion. In the setting of rib pain and hemoptysis, pneumonia, lung infarct, or mass are considerations.She is a poor historian,due to extensive psychiatric diagnoses and was unable to give many details. She was staffed by Dr. Wynona Neatlalere, who opted for admission . She will be pre admitted to 2W 01 with PCCM as Primary.   Past Medical History   Past Medical History:  Diagnosis Date  . Arm pain   . Bipolar affective disorder, currently manic, mild (HCC)   . CHF (congestive heart failure) (HCC)   . Eye globe prosthesis   . HTN (hypertension)   . Hyperthyroidism   . Sinus tachycardia     Significant Hospital Events     08/09/2018 Admission for dyspnea  Consults:    Procedures:  08/09/2018 CTA  Significant Diagnostic Tests:  08/10/2018 CTA > 1. No demonstrable pulmonary embolus. No thoracic aortic aneurysm. No thoracic aortic dissection seen; it must be cautioned that the contrast bolus does not sufficiently opacified the aorta to assess meaningfully for potential dissection.Moderate free-flowing pleural effusion. Mild interstitial edema. Mild cardiomegaly. These findings are felt to represent a degree of congestive heart failure. Compressive atelectasis right lower lobe. Patchy atelectasis left base. No frank airspace consolidation. Reflux of contrast into the inferior vena cava and hepatic veins is felt to be indicative of a degree of increase in right heart pressure. No appreciable thoracic adenopathy.  Micro Data:    Antimicrobials:  Doxycycline 08/09/2018 >   Interim history/subjective:  No events overnight. Patient remains hypoxic and short of breath. Refused CTA however was able to go this AM.   Objective   Blood pressure (!) 125/101, pulse (!) 115, temperature 98 F (36.7 C), temperature source Oral, resp. rate 19, weight 57.3 kg, last menstrual period 07/12/2018, SpO2 98 %.        Intake/Output Summary (Last 24 hours) at 08/10/2018 1139 Last data filed at 08/10/2018 0730 Gross per 24 hour  Intake 490 ml  Output 650 ml  Net -160 ml   Filed Weights   08/10/18 0415  Weight: 57.3 kg    Physical Exam:  General- Adult female, sitting in bed HEENT: Dry MM Cardiac: RRR, no MRG  Chest: Diminished breath sounds, no wheeze/crackles  Abd.: Soft Non-tender, ND, NT,  Ext: +1 BLE edema  Neuro:  Lethargic >> easily arouses, oriented, follows commands  Skin: No rashes, warm and dry  Resolved Hospital Problem list     Assessment & Plan:   Acute on Chronic Hypoxic Respiratory Failure in setting of decompensated heart failure and pleural effusion  -CTA negative PE  H/O COPD (does not appear  to be AECOPD)  Plan: Wean Supplemental Oxygen to maintain saturation >92 Pulmonary Hygiene  Trend CXR Obtain ABG (due to lethargy)  Xopenex PRN  Diuresis as below   Decompensated Heart Failure in setting of Cocaine Use  H/O CHF (Last ECHO 2012)  -Patient reports using lasix more frequently due to increased swelling in LE Plan  Lasix 40 mg x 1  ECHO pending   Stage 3 Chronic Kidney Disease Plan -Trend BMP Replete electrolytes as needed D/C Fluids  Lasix as above   Hyperthyroid DM (HAIC 5.8)  Plan: TSH .492 Trend Glucose   BiPolar Disease History of substance abuse -UDS +Cocaine  Plan Continue Abilify Consider psych consult Case management consult Social Work consult  Will sign off to [facility] for pick up on 2/14.   Best practice:  DVT prophylaxis: Heparin Code Status: Full Family Communication: No family at bedside, patient updated Disposition: Progressive 2W  Labs   CBC: Recent Labs  Lab 08/09/18 1650 08/10/18 0303  WBC 6.0 6.5  NEUTROABS 3.5  --   HGB 12.0 11.0*  HCT 39.2 36.1  MCV 81.0 80.9  PLT 307 312    Basic Metabolic Panel: Recent Labs  Lab 08/09/18 1650 08/10/18 0303  NA 137 138  K 4.1 4.7  CL 102 104  CO2 23 22  GLUCOSE 91 109*  BUN 18 18  CREATININE 1.34* 1.41*  CALCIUM 8.4* 8.4*  MG 2.0 1.9  PHOS  --  3.5   GFR: Estimated Creatinine Clearance: 45.8 mL/min (A) (by C-G formula based on SCr of 1.41 mg/dL (H)). Recent Labs  Lab 08/09/18 1650 08/10/18 0303  WBC 6.0 6.5    Liver Function Tests: Recent Labs  Lab 08/10/18 0303  AST 24  ALT 22  ALKPHOS 77  BILITOT 0.8  PROT 6.0*  ALBUMIN 2.7*   No results for input(s): LIPASE, AMYLASE in the last 168 hours. No results for input(s): AMMONIA in the last 168 hours.  ABG    Component Value Date/Time   HCO3 18.7 (L) 05/17/2013 1505   TCO2 22 10/22/2016 0923   ACIDBASEDEF 5.8 (H) 05/17/2013 1505   O2SAT 55.8 05/17/2013 1505     Coagulation Profile: No results for  input(s): INR, PROTIME in the last 168 hours.  Cardiac Enzymes: Recent Labs  Lab 08/09/18 1650 08/10/18 0303  TROPONINI 0.14* 0.15*    HbA1C: Hgb A1c MFr Bld  Date/Time Value Ref Range Status  08/09/2018 04:50 PM 5.8 (H) 4.8 - 5.6 % Final    Comment:    (NOTE) Pre diabetes:          5.7%-6.4% Diabetes:              >6.4% Glycemic control for   <7.0% adults with diabetes   12/21/2014 07:00 AM 5.4 4.8 - 5.6 % Final    Comment:    (NOTE)         Pre-diabetes: 5.7 - 6.4         Diabetes: >6.4         Glycemic control for adults with diabetes: <7.0     CBG: No results for input(s): GLUCAP in the  last 168 hours.  Past Medical History  She,  has a past medical history of Arm pain, Bipolar affective disorder, currently manic, mild (HCC), CHF (congestive heart failure) (HCC), Eye globe prosthesis, HTN (hypertension), Hyperthyroidism, and Sinus tachycardia.   Surgical History    Past Surgical History:  Procedure Laterality Date  . DILATION AND CURETTAGE OF UTERUS    . EYE SURGERY       Social History   reports that she has been smoking cigarettes. She has never used smokeless tobacco. She reports current alcohol use. She reports current drug use. Drug: Marijuana.   Family History   Her family history includes Asthma in her son; Diabetes in her maternal uncle and paternal grandmother; Emphysema in an other family member; Hypertension in an other family member.   Allergies Allergies  Allergen Reactions  . Amoxil [Amoxicillin] Anaphylaxis  . Hydroxyzine     Other reaction(s): Bleeding  . Ketorolac Tromethamine Hives and Swelling  . Prenatal [B-Plex Plus] Nausea Only  . Tegretol [Carbamazepine] Other (See Comments)    Swelling, skin peeling, skin discoloration  . B-Plex Plus Nausea Only    Prenatal   . Tegretol [Carbamazepine] Swelling and Other (See Comments)    Skin peeling Skin discoloring      Home Medications  Prior to Admission medications   Medication  Sig Start Date End Date Taking? Authorizing Provider  albuterol (PROVENTIL HFA;VENTOLIN HFA) 108 (90 Base) MCG/ACT inhaler Inhale 2 puffs into the lungs every 4 (four) hours as needed for wheezing or shortness of breath. 08/09/18   Ivonne Andrew, NP  albuterol (PROVENTIL) (2.5 MG/3ML) 0.083% nebulizer solution Take 3 mLs (2.5 mg total) by nebulization every 6 (six) hours as needed for wheezing or shortness of breath. 08/01/18   Tomma Lightning, MD  amantadine (SYMMETREL) 100 MG capsule Take 1 capsule (100 mg total) 2 (two) times daily by mouth. 05/09/17   Oneta Rack, NP  ARIPiprazole (ABILIFY) 10 MG tablet Take 1 tablet (10 mg total) daily by mouth. 05/10/17   Oneta Rack, NP  doxycycline (VIBRA-TABS) 100 MG tablet Take 1 tablet (100 mg total) by mouth 2 (two) times daily. 08/09/18   Ivonne Andrew, NP  furosemide (LASIX) 20 MG tablet Take 1 tablet (20 mg total) by mouth daily. 08/01/18   Tomma Lightning, MD  hydrOXYzine (ATARAX/VISTARIL) 50 MG tablet Take 1 tablet (50 mg total) 3 (three) times daily as needed by mouth for anxiety. 05/09/17   Oneta Rack, NP  mirtazapine (REMERON) 15 MG tablet Take 1 tablet (15 mg total) at bedtime by mouth. 05/09/17   Oneta Rack, NP  nicotine polacrilex (NICORETTE) 2 MG gum Take 1 each (2 mg total) by mouth as needed for smoking cessation. 08/09/18   Tomma Lightning, MD         Jovita Kussmaul, AGACNP-BC [facility]  Pgr: [phone]  PCCM Pgr: [phone]  Attending Note:  43 year old female with PMH above presenting to PCCM with suspicion of PE and RLL PNA.  Overnight, multiple complaints that are less than important.  On exam, decreased RLL BS but otherwise clear.  I reviewed CT of the chest myself, no PE, right sided pleural effusion noted with evidence of pulmonary HTN/RH strain.  Discussed with PCCM-NP.  PE: negative  - D/C heparin drip  CAP:  - No infiltrate, no WBC and no fever  - D/C  doxy  Pleural effusion: due to CHF  - No  thora  - Diureses  Right heart strain:  - Echo ordered  - May need a cardiology consult but with cocaine use doubt will be much help  Substance abuse  - Counseling as outpatient  Hypoxemia:  - Titrate O2 for sat of 90-92%  CHF:  - May need cardiology consult pending echo results  Hemoptysis: resolved  - Monitor clinically  Will transfer to care to [facility] with PCCM off 2/14  Patient seen and examined, agree with above note.  I dictated the care and orders written for this patient under my direction.  Alyson Reedy, MD [phone]

## 2018-08-10 NOTE — Progress Notes (Signed)
Pt had 12b run of vtach, currently sleeping and asymptomatic. HR in 110s. Will continue to monitor.

## 2018-08-11 DIAGNOSIS — E440 Moderate protein-calorie malnutrition: Secondary | ICD-10-CM

## 2018-08-11 DIAGNOSIS — Z720 Tobacco use: Secondary | ICD-10-CM

## 2018-08-11 DIAGNOSIS — F1410 Cocaine abuse, uncomplicated: Secondary | ICD-10-CM

## 2018-08-11 DIAGNOSIS — I5021 Acute systolic (congestive) heart failure: Secondary | ICD-10-CM

## 2018-08-11 LAB — MAGNESIUM: Magnesium: 1.7 mg/dL (ref 1.7–2.4)

## 2018-08-11 LAB — BASIC METABOLIC PANEL
Anion gap: 10 (ref 5–15)
BUN: 20 mg/dL (ref 6–20)
CHLORIDE: 104 mmol/L (ref 98–111)
CO2: 22 mmol/L (ref 22–32)
Calcium: 8.5 mg/dL — ABNORMAL LOW (ref 8.9–10.3)
Creatinine, Ser: 1.52 mg/dL — ABNORMAL HIGH (ref 0.44–1.00)
GFR calc Af Amer: 49 mL/min — ABNORMAL LOW (ref >60)
GFR calc non Af Amer: 42 mL/min — ABNORMAL LOW (ref >60)
Glucose, Bld: 86 mg/dL (ref 70–99)
POTASSIUM: 4.1 mmol/L (ref 3.5–5.1)
Sodium: 136 mmol/L (ref 135–145)

## 2018-08-11 LAB — CBC
HCT: 37.7 % (ref 36.0–46.0)
HEMOGLOBIN: 12 g/dL (ref 12.0–15.0)
MCH: 25.4 pg — ABNORMAL LOW (ref 26.0–34.0)
MCHC: 31.8 g/dL (ref 30.0–36.0)
MCV: 79.9 fL — ABNORMAL LOW (ref 80.0–100.0)
Platelets: 299 10*3/uL (ref 150–400)
RBC: 4.72 MIL/uL (ref 3.87–5.11)
RDW: 15.7 % — ABNORMAL HIGH (ref 11.5–15.5)
WBC: 6.3 10*3/uL (ref 4.0–10.5)
nRBC: 0 % (ref 0.0–0.2)

## 2018-08-11 LAB — LEGIONELLA PNEUMOPHILA SEROGP 1 UR AG: L. pneumophila Serogp 1 Ur Ag: NEGATIVE

## 2018-08-11 LAB — PHOSPHORUS: Phosphorus: 4.5 mg/dL (ref 2.5–4.6)

## 2018-08-11 LAB — GLUCOSE, CAPILLARY: Glucose-Capillary: 127 mg/dL — ABNORMAL HIGH (ref 70–99)

## 2018-08-11 MED ORDER — MAGNESIUM SULFATE 4 GM/100ML IV SOLN
4.0000 g | Freq: Once | INTRAVENOUS | Status: AC
  Administered 2018-08-11: 4 g via INTRAVENOUS
  Filled 2018-08-11: qty 100

## 2018-08-11 MED ORDER — DIGOXIN 125 MCG PO TABS
0.0625 mg | ORAL_TABLET | Freq: Every day | ORAL | Status: DC
  Administered 2018-08-11: 0.0625 mg via ORAL
  Administered 2018-08-12 – 2018-08-13 (×2): 0.625 mg via ORAL
  Administered 2018-08-14: 0.0625 mg via ORAL
  Filled 2018-08-11 (×5): qty 1

## 2018-08-11 MED ORDER — LOSARTAN POTASSIUM 25 MG PO TABS
25.0000 mg | ORAL_TABLET | Freq: Every day | ORAL | Status: DC
  Administered 2018-08-11 – 2018-08-16 (×6): 25 mg via ORAL
  Filled 2018-08-11 (×6): qty 1

## 2018-08-11 MED ORDER — FUROSEMIDE 40 MG PO TABS
40.0000 mg | ORAL_TABLET | Freq: Every day | ORAL | Status: DC
  Administered 2018-08-12 – 2018-08-14 (×3): 40 mg via ORAL
  Filled 2018-08-11 (×4): qty 1

## 2018-08-11 MED ORDER — SPIRONOLACTONE 12.5 MG HALF TABLET
12.5000 mg | ORAL_TABLET | Freq: Every day | ORAL | Status: DC
  Administered 2018-08-11 – 2018-08-14 (×4): 12.5 mg via ORAL
  Filled 2018-08-11 (×5): qty 1

## 2018-08-11 MED ORDER — ENSURE ENLIVE PO LIQD
237.0000 mL | Freq: Two times a day (BID) | ORAL | Status: DC
  Administered 2018-08-11 – 2018-08-17 (×10): 237 mL via ORAL

## 2018-08-11 NOTE — Progress Notes (Signed)
Initial Nutrition Assessment  DOCUMENTATION CODES:   Non-severe (moderate) malnutrition in context of chronic illness  INTERVENTION:    Ensure Enlive po BID, each supplement provides 350 kcal and 20 grams of protein  NUTRITION DIAGNOSIS:   Moderate Malnutrition related to chronic illness(COPD, chronic diastolic HF, polysubstance abuse) as evidenced by moderate fat depletion, moderate muscle depletion and 10% weight loss in < 2 weeks  GOAL:   Patient will meet greater than or equal to 90% of their needs  MONITOR:   PO intake, Supplement acceptance, Labs, Skin, Weight trends, I & O's  REASON FOR ASSESSMENT:   Consult Assessment of nutrition requirement/status  ASSESSMENT:   43 y.o. Female with a history of COPD, HTN, chronic diastolic HF, bipolar disorder, schizophrenia, and polysubstance abuse; admitted with admitted with hemoptysis and elevated d-dimer; found to have newly reduced EF 20-25%.  RD spoke with patient at bedside; she is restless. Pt reports she's been eating well here during hospitalization. At home consumes 1-3 meals per day, depending how she's feeling.  Pt share she does drink Ensure supplements when she can afford them. Per readings below, pt has had a 10% weight loss in last 1.5 weeks. Labs & medications reviewed. CBG 127.  NUTRITION - FOCUSED PHYSICAL EXAM:    Most Recent Value  Orbital Region  Mild depletion  Upper Arm Region  Moderate depletion  Thoracic and Lumbar Region  Moderate depletion  Buccal Region  Mild depletion  Temple Region  Mild depletion  Clavicle Bone Region  Moderate depletion  Clavicle and Acromion Bone Region  Moderate depletion  Scapular Bone Region  Moderate depletion  Dorsal Hand  Mild depletion  Patellar Region  Moderate depletion  Anterior Thigh Region  Moderate depletion  Posterior Calf Region  Moderate depletion  Edema (RD Assessment)  None     Diet Order:   Diet Order            Diet regular Room service  appropriate? Yes; Fluid consistency: Thin  Diet effective now             EDUCATION NEEDS:   No education needs have been identified at this time  Skin:  Skin Assessment: Reviewed RN Assessment  Last BM:  2/11  Height:   Ht Readings from Last 1 Encounters:  08/10/18 5\' 4"  (1.626 m)   Weight:   Wt Readings from Last 1 Encounters:  08/11/18 55 kg   Wt Readings from Last 10 Encounters:  08/11/18 55 kg  08/09/18 58.9 kg  08/01/18 61.2 kg  05/08/17 63 kg  08/03/16 68 kg  05/05/16 72.6 kg  03/29/16 63.5 kg  03/17/16 63.5 kg  11/27/13 66.4 kg  10/12/13 65.9 kg   BMI:  Body mass index is 20.82 kg/m.  Estimated Nutritional Needs:   Kcal:  1600-1800  Protein:  85-100 gm  Fluid:  per MD  Maureen Chatters, RD, LDN Pager #: [phone] After-Hours Pager #: [phone]

## 2018-08-11 NOTE — Progress Notes (Addendum)
CSW provided patient with Mental Health, Inpatient & outpatient substance abuse resources, DHHS transportation contact number and [employer] contact number.    Clinical Social Worker will sign off for now as social work intervention is no longer needed. Please consult [place] if new need arises.    Antony Blackbird, [facility] Clinical Social Worker [phone]

## 2018-08-11 NOTE — Care Management Important Message (Signed)
Important Message  Patient Details  Name: Christine Cobb MRN: [id number] Date of Birth: 01-04-1976   Medicare Important Message Given:  Yes    Christine Cobb Christine Cobb 08/11/2018, 11:52 AM
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
## 2018-08-11 NOTE — Progress Notes (Signed)
PROGRESS NOTE    Christine Cobb  ACZ:[id number] DOB: 1976-04-26 DOA: 08/09/2018 PCP: Patient, No Pcp Per   Brief Narrative: 43 year old female with past medical history of COPD, congestive heart failure, hypertension, hypothyroidism, bipolar mood disorder/size affective disorder with polysubstance abuse, smoker presented to pulmonary office 08/09/2026 with cough hemoptysis dyspnea and positive d-dimer of 4.34.  Patient was sent to the [facility] for direct admission under pulmonary critical care medicine O N2/12/20.  Patient is being treated for acute on chronic hypoxic respiratory failure from CHF, pleural effusion.  Her CT was negative for PE.  Patient was being diuresed, treated nebulizer, bronchodilators.  Patient was transferred to [facility] 08/11/18  Subjective: This morning still complains of ongoing cough with sputum production, dyspnea with minimal activity, having bouts of cough.  Tearful about her diagnosis of CHF.  Denies chest pain nausea vomiting.  Assessment & Plan:  Acute on chronic hypoxic respiratory failure in the setting of decompensated CHF.  Patient is currently on room air.  Continue supportive care pulmonary toileting  Congestive heart failure EF is 20-25% last echo in 2012 with normal EF, systolic CHF, ?New onset. Does have congestive heart failure.  Echo showes severe TR, severe pulmonary hypertension. Cont on diuresis, consulted cardiology.  Patient appears tachycardic- has hx of hyperthyroidism- not on meds.  Weight at 121.3 pound from 126.4 pound.    Positive troponin at 0.15- 0.14, flat likely from systolic dysfunction. Consulted cardiology.  History of hyperthyroidism:TSH is stable at 0.49. Not on any home medication.  Patient asking for "thyroid medication".   COPD, not in exacerbation.  Continue bronchodilator therapy.  Smoking/tobacco abuse has not smoked in 6 days,advsied to quit.  Weight loss:?amount. Consulted dietitian.  Stage III CKD:  Baseline creatinine from January to May 2019 around 1.1-1.4. creat at 1.5. monitor on lasix.  Diabetes mellitus HbA1c 5.8.  monitoR CBG  Bipolar disorder/schizoaffective disorder: continue her Remeron and Abilify.  [facility], it seems she was on Depakote 750 mg daily/and monitoring her milligrams twice daily, Abilify, Aristada. She reports she sees a psychiatrist outpatient.  Mood appears to stable, wanting to see the psychiatrist.  I have consulted psychiatry. Polysubstance abuse: UDS positive and cocaine.  Cessation counseled.Social worker following.    DVT prophylaxis: Heparin Code Status: fullCode Family Communication: none at bedside Disposition Plan:Remains inpatient   Consultants:  None  Procedures: TTE IMPRESSIONS    1. The left ventricle has severely reduced systolic function, with an ejection fraction of 20-25%. The cavity size was mildly dilated. There is mildly increased left ventricular wall thickness. Left ventricular diastolic Doppler parameters are  consistent with restrictive filling Left ventricular diffuse hypokinesis.  2. The right ventricle has moderately reduced systolic function. The cavity was mildly enlarged. There is no increase in right ventricular wall thickness. Right ventricular systolic pressure is severely elevated with an estimated pressure of 66.3 mmHg.  3. Right atrial pressure is estimated at 15 mmHg.  4. Left atrial size was severely dilated.  5. Right atrial size was mildly dilated.  6. Trivial pericardial effusion.  7. The mitral valve is normal in structure. There is mild thickening.  8. The tricuspid valve is normal in structure. Tricuspid valve regurgitation is severe.  9. The aortic valve is tricuspid There is mild thickening of the aortic valve. 10. The pulmonic valve was normal in structure. Pulmonic valve regurgitation is mild by color flow Doppler. 11. The inferior vena cava was dilated in size with <  50%  respiratory variability. 12. Severe global reduction in LV systolic function; mild LVH; 4 chamber enlargement; restrictive filling; moderate RV dysfunction; mild MR; severe TR; severe pulmonary hypertension.   Antimicrobials: Anti-infectives (From admission, onward)   Start     Dose/Rate Route Frequency Ordered Stop   08/09/18 1615  doxycycline (VIBRA-TABS) tablet 100 mg  Status:  Discontinued     100 mg Oral Every 12 hours 08/09/18 1600 08/10/18 1146       Objective: Vitals:   08/10/18 2207 08/11/18 0037 08/11/18 0345 08/11/18 0802  BP:  109/90 (!) 131/97 (!) 139/103  Pulse:   (!) 124 (!) 102  Resp:  18    Temp:  98.2 F (36.8 C) 98.6 F (37 C) 98.1 F (36.7 C)  TempSrc:  Oral Oral Oral  SpO2:  98% 96% 91%  Weight:   55 kg   Height: 5\' 4"  (1.626 m)       Intake/Output Summary (Last 24 hours) at 08/11/2018 1139 Last data filed at 08/11/2018 0803 Gross per 24 hour  Intake 1080 ml  Output 4800 ml  Net -3720 ml   Filed Weights   08/10/18 0415 08/11/18 0345  Weight: 57.3 kg 55 kg   Weight change: -2.313 kg  Body mass index is 20.82 kg/m.  Intake/Output from previous day: 02/13 0701 - 02/14 0700 In: 1080 [P.O.:1080] Out: 5450 [Urine:5450] Intake/Output this shift: Total I/O In: 240 [P.O.:240] Out: -   Examination:  General exam: Appears calm and comfortable, thin built, tearful, older for the age. HEENT:PERRL,Oral mucosa moist, Ear/Nose normal on gross exam Respiratory system: Bilateral diminished air entry, basal crackles.Equal air entry, normal vesicular breath sounds, no wheezes or crackles.  Cardiovascular system: S1 & S2 heard, tachycardic, no JVD, murmurs. Gastrointestinal system: Abdomen is  soft, non tender, non distended, BS +  Nervous System:Alert and oriented. No focal neurological deficits/moving extremities, sensation intact. Extremities: Mild pitting edema, no clubbing, distal peripheral pulses palpable. Skin: No rashes, lesions, no icterus MSK:  Normal muscle bulk,tone ,power  Medications:  Scheduled Meds: . ARIPiprazole  10 mg Oral Daily  . furosemide  20 mg Oral Daily  . guaiFENesin  1,200 mg Oral BID  . heparin  5,000 Units Subcutaneous Q8H  . mirtazapine  15 mg Oral QHS  . pantoprazole  20 mg Oral Daily  . sodium chloride flush  3 mL Intravenous Q12H   Continuous Infusions:  Data Reviewed: I have personally reviewed following labs and imaging studies  CBC: Recent Labs  Lab 08/09/18 1650 08/10/18 0303 08/11/18 0337  WBC 6.0 6.5 6.3  NEUTROABS 3.5  --   --   HGB 12.0 11.0* 12.0  HCT 39.2 36.1 37.7  MCV 81.0 80.9 79.9*  PLT 307 312 299   Basic Metabolic Panel: Recent Labs  Lab 08/09/18 1650 08/10/18 0303 08/11/18 0337  NA 137 138 136  K 4.1 4.7 4.1  CL 102 104 104  CO2 23 22 22   GLUCOSE 91 109* 86  BUN 18 18 20   CREATININE 1.34* 1.41* 1.52*  CALCIUM 8.4* 8.4* 8.5*  MG 2.0 1.9 1.7  PHOS  --  3.5 4.5   GFR: Estimated Creatinine Clearance: 42.5 mL/min (A) (by C-G formula based on SCr of 1.52 mg/dL (H)). Liver Function Tests: Recent Labs  Lab 08/10/18 0303  AST 24  ALT 22  ALKPHOS 77  BILITOT 0.8  PROT 6.0*  ALBUMIN 2.7*   No results for input(s): LIPASE, AMYLASE in the last 168 hours. No results for  input(s): AMMONIA in the last 168 hours. Coagulation Profile: No results for input(s): INR, PROTIME in the last 168 hours. Cardiac Enzymes: Recent Labs  Lab 08/09/18 1650 08/10/18 0303  TROPONINI 0.14* 0.15*   BNP (last 3 results) No results for input(s): PROBNP in the last 8760 hours. HbA1C: Recent Labs    08/09/18 1650  HGBA1C 5.8*   CBG: Recent Labs  Lab 08/11/18 0837  GLUCAP 127*   Lipid Profile: No results for input(s): CHOL, HDL, LDLCALC, TRIG, CHOLHDL, LDLDIRECT in the last 72 hours. Thyroid Function Tests: Recent Labs    08/09/18 1650  TSH 0.492   Anemia Panel: No results for input(s): VITAMINB12, FOLATE, FERRITIN, TIBC, IRON, RETICCTPCT in the last 72  hours. Sepsis Labs: No results for input(s): PROCALCITON, LATICACIDVEN in the last 168 hours.  No results found for this or any previous visit (from the past 240 hour(s)).    Radiology Studies: Ct Angio Chest Pe W Or Wo Contrast  Result Date: 08/10/2018 CLINICAL DATA:  Shortness of breath and chest pain. EXAM: CT ANGIOGRAPHY CHEST WITH CONTRAST TECHNIQUE: Multidetector CT imaging of the chest was performed using the standard protocol during bolus administration of intravenous contrast. Multiplanar CT image reconstructions and MIPs were obtained to evaluate the vascular anatomy. CONTRAST:  ISOVUE-370 IOPAMIDOL (ISOVUE-370) INJECTION 76% COMPARISON:  August 09, 2018 chest radiograph FINDINGS: Cardiovascular: There is no demonstrable pulmonary embolus. There is no appreciable thoracic aortic aneurysm. While no dissection is seen, the contrast bolus is not sufficient to assess for thoracic aortic dissection. Visualized great vessels appear unremarkable. There is no pericardial effusion or pericardial thickening evident. Heart remains mildly enlarged. Mediastinum/Nodes: Thyroid appears unremarkable. There is no appreciable thoracic adenopathy. No esophageal lesions are evident. Lungs/Pleura: There is a moderate free-flowing pleural effusion on the right. There is compressive atelectasis in the right lower lobe. There is also atelectasis in the left lower lobe without frank consolidation. There is mild generalized interstitial thickening, likely due to mild pulmonary edema. Upper Abdomen: There is reflux of contrast into the inferior vena cava and hepatic veins. Visualized upper abdominal structures otherwise appear unremarkable. Musculoskeletal: There is a degree of anasarca. There are no blastic or lytic bone lesions. No chest wall lesions are appreciable. Review of the MIP images confirms the above findings. IMPRESSION: 1. No demonstrable pulmonary embolus. No thoracic aortic aneurysm. No thoracic  aortic dissection seen; it must be cautioned that the contrast bolus does not sufficiently opacified the aorta to assess meaningfully for potential dissection. 2. Moderate free-flowing pleural effusion. Mild interstitial edema. Mild cardiomegaly. These findings are felt to represent a degree of congestive heart failure. 3. Compressive atelectasis right lower lobe. Patchy atelectasis left base. No frank airspace consolidation. 4. Reflux of contrast into the inferior vena cava and hepatic veins is felt to be indicative of a degree of increase in right heart pressure. 5.  No appreciable thoracic adenopathy. Electronically Signed   By: Bretta Bang III M.D.   On: 08/10/2018 08:56   Portable Chest 1 View  Result Date: 08/10/2018 CLINICAL DATA:  Respiratory failure EXAM: PORTABLE CHEST 1 VIEW COMPARISON:  August 09, 2018 FINDINGS: There is stable cardiomegaly. The pulmonary vascularity appears within normal limits. There is a slightly larger right pleural effusion with bibasilar atelectasis. No adenopathy. No bone lesions. IMPRESSION: Cardiomegaly. Right pleural effusion, slightly larger. Bibasilar atelectasis. Electronically Signed   By: Bretta Bang III M.D.   On: 08/10/2018 08:57      LOS: 2 days   Time  spent: More than 50% of that time was spent in counseling and/or coordination of care.  Lanae Boast, MD [facility]  08/11/2018, 11:39 AM

## 2018-08-11 NOTE — Clinical Social Work Note (Signed)
Clinical Social Work Assessment  Patient Details  Name: Christine Cobb MRN: [id number] Date of Birth: May 06, 1976  Date of referral:  08/10/18               Reason for consult:  Housing Concerns/Homelessness, Mental Health Concerns, Emotional/Coping/Adjustment to Illness, Transportation, Substance Use/ETOH Abuse                Permission sought to share information with:  Family Supports Permission granted to share information::  Yes, Verbal Permission Granted  Name::     [place] Mudlogger::     Relationship::  spouse  Contact Information:  [phone]  Housing/Transportation Living arrangements for the past 2 months:  Single Family Home Source of Information:  Patient Patient Interpreter Needed:  None Criminal Activity/Legal Involvement Pertinent to Current Situation/Hospitalization:  No - Comment as needed Significant Relationships:  Spouse Lives with:  Spouse Do you feel safe going back to the place where you live?  Yes Need for family participation in patient care:  Yes (Comment)  Care giving concerns:  CSW received consult for Substance abuse.  Patient expressed concerns about afford housing, transportation needs to medical appointments and tobacco use.    Social Worker assessment / plan:  CSW and RNCM visit the patient at bedside. Patient was alert and oriented. Patient states she lives in the home with her spouse and son. Patient expressed concerns regarding transportation to medical appointments. Patient expressed she spends a lot of her money in cab fair, urber and other forms of transportation.  Patient states she has a  Cytogeneticist at [employer] and she had helped her with getting medical transportation with DSS but that was possibly over 1 yr ago. CSW gave the patient contact number for DSS transportation. Patient states receives disability but after she pays her rent she has little income left over. CSW gave the patient [employer]  contact information.   Patient states she has not has a drink of alcohol in over 6 years. She last used cocaine 2 months ago and marijuana over 4 month. Patient states her main problem is smoking cigarettes(1 pack per day). Patient was excited that she had not had a smoke in over 5 days. Patient expressed she wants to stop smoking and remain drug free and sober. CSW provided the patient with inpatient and outpatient substance abuse and Behavioral Health resources in the community.    Employment status:  Disabled (Comment on whether or not currently receiving Disability) Insurance information:  Medicare, Medicaid In [place] PT Recommendations:  No Follow Up Information / Referral to community resources:  Outpatient Substance Abuse Treatment Options, Residential Substance Abuse Treatment Options  Patient/Family's Response to care:  Patient expressed she appreciated resources provided by CSW.  Patient asked appropriate questions regarding her care and expressed no concerns.  Patient/Family's Understanding of and Emotional Response to Diagnosis, Current Treatment, and Prognosis:  Patient was tearful at times. She states its "nobody fault but my own". Patient expressed being hopeful regarding her medical condition. CSW encourage the patient to continue her progress on not smoking and to utilize the resources and support in the community.  Patient expressed understanding of CSW role and discharge process as well as medical condition. No questions/concerns about plan or treatment at this time.   Emotional Assessment Appearance:  Appears older than stated age Attitude/Demeanor/Rapport:  Engaged Affect (typically observed):  Accepting, Tearful/Crying, Hopeful, Appropriate Orientation:  Oriented to Self, Oriented to  Time, Oriented to  Place, Oriented to Situation Alcohol / Substance use:  Tobacco Use Psych involvement (Current and /or in the community):  No (Comment)  Discharge Needs  Concerns to be  addressed:  Mental Health Concerns, Substance Abuse Concerns Readmission within the last 30 days:  No Current discharge risk:  None Barriers to Discharge:  Continued Medical Work up   [employer], LCSWA 08/11/2018, 4:29 PM

## 2018-08-12 ENCOUNTER — Encounter (HOSPITAL_COMMUNITY): Payer: Self-pay | Admitting: *Deleted

## 2018-08-12 LAB — BASIC METABOLIC PANEL
ANION GAP: 7 (ref 5–15)
BUN: 24 mg/dL — ABNORMAL HIGH (ref 6–20)
CO2: 23 mmol/L (ref 22–32)
Calcium: 8.7 mg/dL — ABNORMAL LOW (ref 8.9–10.3)
Chloride: 106 mmol/L (ref 98–111)
Creatinine, Ser: 1.26 mg/dL — ABNORMAL HIGH (ref 0.44–1.00)
GFR calc Af Amer: >60 mL/min (ref >60)
GFR, EST NON AFRICAN AMERICAN: 53 mL/min — AB (ref >60)
Glucose, Bld: 106 mg/dL — ABNORMAL HIGH (ref 70–99)
Potassium: 4.4 mmol/L (ref 3.5–5.1)
Sodium: 136 mmol/L (ref 135–145)

## 2018-08-12 NOTE — Progress Notes (Signed)
PROGRESS NOTE    Christine Cobb  MCN:[id number] DOB: 04/06/1976 DOA: 08/09/2018 PCP: Patient, No Pcp Per   Brief Narrative: 43 year old female with past medical history of COPD, congestive heart failure, hypertension, hypothyroidism, bipolar mood disorder/size affective disorder with polysubstance abuse, smoker presented to pulmonary office 08/09/2026 with cough hemoptysis dyspnea and positive d-dimer of 4.34.  Patient was sent to the [facility] for direct admission under pulmonary critical care medicine O N2/12/20.  Patient is being treated for acute on chronic hypoxic respiratory failure from CHF, pleural effusion.  Her CT was negative for PE.  Patient was being diuresed, treated nebulizer, bronchodilators.  Patient was transferred to [facility] 08/11/18  Subjective: Resting comfortably this morning.  On room air.  Heart rate remains rather uncontrolled 110s to 120s.  Still endorses dyspnea with activity but improving.  Denies chest pain, nausea, vomiting, fever, chills.  Assessment & Plan:  Acute on chronic hypoxic respiratory failure in the setting of decompensated CHF.  Patient is currently on room air.  Continue supportive care pulmonary toileting  Acute systolic congestive heart failure EF is 20-25% last echo in 2012 with normal EF. ?New onset.   Echo showes severe TR, severe pulmonary hypertension. Cont on diuresis 40 mg daily.  Patient was started on losartan 25, [place] 12.5 mg, digoxin .125 ( if she is not going to be compliant cardio does not recommend to send her home on digoxin). Appreciate cardiology inputs..  Patient appears tachycardic in 110-120s at rest. Cont to optimize meds.  Positive troponin at 0.15- 0.14, flat likely from systolic dysfunction.  Patient not interested in [facility].   Stage III CKD: Baseline creatinine from January to May 2019 around 1.1-1.4. creat at 1.5-1.2.Monitor on lasix. Recent Labs  Lab 08/09/18 1650 08/10/18 0303 08/11/18 0337  08/12/18 1013  CREATININE 1.34* 1.41* 1.52* 1.26*    History of hyperthyroidism:TSH is stable at 0.49. Not on any home medication.  Patient asking for "thyroid medication".   COPD, not in exacerbation.  Continue bronchodilator therapy.  Smoking/tobacco abuse has not smoked in 6 days,advsied to quit.  Weight loss:?amount. Consulted dietitian.  Diabetes mellitus HbA1c 5.8.  Labile, monitoR CBG  Bipolar disorder/schizoaffective disorder: continue her Remeron and Abilify.  [facility], it seems she was on Depakote 750 mg daily/and monitoring her milligrams twice daily, Abilify, Aristada. She reports she sees a psychiatrist outpatient.  Mood appears to stable, wanting to see the psychiatrist and I had discussed w Dr Normal Polysubstance abuse: UDS positive and cocaine.  Cessation counseled.Social worker following.  Social worker has provided mental health resources to patients  DVT prophylaxis: Heparin Code Status: fullCode Family Communication: none at bedside Disposition Plan:Remains inpatient pending clinical improvement. Pt eval requested.  Consultants:  Cardiology PCCM  Procedures: TTE IMPRESSIONS    1. The left ventricle has severely reduced systolic function, with an ejection fraction of 20-25%. The cavity size was mildly dilated. There is mildly increased left ventricular wall thickness. Left ventricular diastolic Doppler parameters are  consistent with restrictive filling Left ventricular diffuse hypokinesis.  2. The right ventricle has moderately reduced systolic function. The cavity was mildly enlarged. There is no increase in right ventricular wall thickness. Right ventricular systolic pressure is severely elevated with an estimated pressure of 66.3 mmHg.  3. Right atrial pressure is estimated at 15 mmHg.  4. Left atrial size was severely dilated.  5. Right atrial size was mildly dilated.  6. Trivial pericardial effusion.  7. The mitral  valve is normal in structure. There is mild thickening.  8. The tricuspid valve is normal in structure. Tricuspid valve regurgitation is severe.  9. The aortic valve is tricuspid There is mild thickening of the aortic valve. 10. The pulmonic valve was normal in structure. Pulmonic valve regurgitation is mild by color flow Doppler. 11. The inferior vena cava was dilated in size with <50% respiratory variability. 12. Severe global reduction in LV systolic function; mild LVH; 4 chamber enlargement; restrictive filling; moderate RV dysfunction; mild MR; severe TR; severe pulmonary hypertension.   Antimicrobials: Anti-infectives (From admission, onward)   Start     Dose/Rate Route Frequency Ordered Stop   08/09/18 1615  doxycycline (VIBRA-TABS) tablet 100 mg  Status:  Discontinued     100 mg Oral Every 12 hours 08/09/18 1600 08/10/18 1146       Objective: Vitals:   08/11/18 2055 08/12/18 0011 08/12/18 0447 08/12/18 0831  BP: (!) 130/97 (!) 112/95 (!) 119/100 (!) 132/102  Pulse: (!) 58 (!) 121 (!) 118 (!) 118  Resp: 18 20 (!) 21   Temp: 98.7 F (37.1 C) 98 F (36.7 C) 98.2 F (36.8 C) 97.7 F (36.5 C)  TempSrc: Oral Oral Oral Oral  SpO2: 100% 95% 92% 95%  Weight:      Height:        Intake/Output Summary (Last 24 hours) at 08/12/2018 0855 Last data filed at 08/12/2018 6160 Gross per 24 hour  Intake 690 ml  Output 2500 ml  Net -1810 ml   Filed Weights   08/10/18 0415 08/11/18 0345  Weight: 57.3 kg 55 kg   Weight change:   Body mass index is 20.82 kg/m.  Intake/Output from previous day: 02/14 0701 - 02/15 0700 In: 690 [P.O.:690] Out: 2500 [Urine:2500] Intake/Output this shift: Total I/O In: 240 [P.O.:240] Out: -   Examination: General exam: Calm, comfortable, not in acute distress, older for age, average built. HEENT:Oral mucosa moist, Ear/Nose WNL grossly, dentition normal. Respiratory system: Bilateral equal air entry, no crackles and wheezing, no use of  accessory muscle, non tender on palpation. Cardiovascular system: regular rate and rhythm, tachycardic in 120s, S1 & S2 heard, No JVD/murmurs. Gastrointestinal system: Abdomen soft, non-tender, non-distended, BS +. No hepatosplenomegaly palpable. Nervous System:Alert, awake and oriented at baseline. Able to move UE and LE, sensation intact. Extremities: No edema-resolved, distal peripheral pulses palpable.  Skin: No rashes,no icterus. MSK: Normal muscle bulk,tone, power  Medications:  Scheduled Meds: . ARIPiprazole  10 mg Oral Daily  . digoxin  0.0625 mg Oral Daily  . feeding supplement (ENSURE ENLIVE)  237 mL Oral BID BM  . furosemide  40 mg Oral Daily  . guaiFENesin  1,200 mg Oral BID  . heparin  5,000 Units Subcutaneous Q8H  . losartan  25 mg Oral Daily  . mirtazapine  15 mg Oral QHS  . pantoprazole  20 mg Oral Daily  . sodium chloride flush  3 mL Intravenous Q12H  . spironolactone  12.5 mg Oral Daily   Continuous Infusions:  Data Reviewed: I have personally reviewed following labs and imaging studies  CBC: Recent Labs  Lab 08/09/18 1650 08/10/18 0303 08/11/18 0337  WBC 6.0 6.5 6.3  NEUTROABS 3.5  --   --   HGB 12.0 11.0* 12.0  HCT 39.2 36.1 37.7  MCV 81.0 80.9 79.9*  PLT 307 312 299   Basic Metabolic Panel: Recent Labs  Lab 08/09/18 1650 08/10/18 0303 08/11/18 0337  NA 137 138 136  K 4.1 4.7  4.1  CL 102 104 104  CO2 23 22 22   GLUCOSE 91 109* 86  BUN 18 18 20   CREATININE 1.34* 1.41* 1.52*  CALCIUM 8.4* 8.4* 8.5*  MG 2.0 1.9 1.7  PHOS  --  3.5 4.5   GFR: Estimated Creatinine Clearance: 42.5 mL/min (A) (by C-G formula based on SCr of 1.52 mg/dL (H)). Liver Function Tests: Recent Labs  Lab 08/10/18 0303  AST 24  ALT 22  ALKPHOS 77  BILITOT 0.8  PROT 6.0*  ALBUMIN 2.7*   No results for input(s): LIPASE, AMYLASE in the last 168 hours. No results for input(s): AMMONIA in the last 168 hours. Coagulation Profile: No results for input(s): INR,  PROTIME in the last 168 hours. Cardiac Enzymes: Recent Labs  Lab 08/09/18 1650 08/10/18 0303  TROPONINI 0.14* 0.15*   BNP (last 3 results) No results for input(s): PROBNP in the last 8760 hours. HbA1C: Recent Labs    08/09/18 1650  HGBA1C 5.8*   CBG: Recent Labs  Lab 08/11/18 0837  GLUCAP 127*   Lipid Profile: No results for input(s): CHOL, HDL, LDLCALC, TRIG, CHOLHDL, LDLDIRECT in the last 72 hours. Thyroid Function Tests: Recent Labs    08/09/18 1650  TSH 0.492   Anemia Panel: No results for input(s): VITAMINB12, FOLATE, FERRITIN, TIBC, IRON, RETICCTPCT in the last 72 hours. Sepsis Labs: No results for input(s): PROCALCITON, LATICACIDVEN in the last 168 hours.  No results found for this or any previous visit (from the past 240 hour(s)).    Radiology Studies: No results found.    LOS: 3 days   Time spent: More than 50% of that time was spent in counseling and/or coordination of care.  Lanae Boast, MD [facility]  08/12/2018, 8:55 AM

## 2018-08-12 NOTE — Progress Notes (Signed)
Patient refused lab draw this morning, rescheduled for 9 am, when patient is fully away and more likely to comply with order.

## 2018-08-12 NOTE — Progress Notes (Signed)
Progress Note  Patient Name: Christine Cobb Date of Encounter: 08/12/2018  Primary Cardiologist: Bensimohn  Subjective   Still with dyspnea but able to lay flat   Inpatient Medications    Scheduled Meds: . ARIPiprazole  10 mg Oral Daily  . digoxin  0.0625 mg Oral Daily  . feeding supplement (ENSURE ENLIVE)  237 mL Oral BID BM  . furosemide  40 mg Oral Daily  . guaiFENesin  1,200 mg Oral BID  . heparin  5,000 Units Subcutaneous Q8H  . losartan  25 mg Oral Daily  . mirtazapine  15 mg Oral QHS  . pantoprazole  20 mg Oral Daily  . sodium chloride flush  3 mL Intravenous Q12H  . spironolactone  12.5 mg Oral Daily   Continuous Infusions:  PRN Meds: guaiFENesin-dextromethorphan, hydrOXYzine, levalbuterol   Vital Signs    Vitals:   08/11/18 2055 08/12/18 0011 08/12/18 0447 08/12/18 0831  BP: (!) 130/97 (!) 112/95 (!) 119/100 (!) 132/102  Pulse: (!) 58 (!) 121 (!) 118 (!) 118  Resp: 18 20 (!) 21   Temp: 98.7 F (37.1 C) 98 F (36.7 C) 98.2 F (36.8 C) 97.7 F (36.5 C)  TempSrc: Oral Oral Oral Oral  SpO2: 100% 95% 92% 95%  Weight:      Height:        Intake/Output Summary (Last 24 hours) at 08/12/2018 1325 Last data filed at 08/12/2018 1100 Gross per 24 hour  Intake 1067 ml  Output 3500 ml  Net -2433 ml   Last 3 Weights 08/12/2018 08/11/2018 08/10/2018  Weight (lbs) (No Data) 121 lb 4.8 oz 126 lb 6.4 oz  Weight (kg) (No Data) 55.021 kg 57.335 kg      Telemetry    NSR/ Sinus tachycardia - Personally Reviewed  ECG    SR rate 106 no acute changes  - Personally Reviewed  Physical Exam  Thin black female  GEN: No acute distress.   Neck:  JVP elevated  Cardiac: RRR, noS3 gallop PMI increased  Respiratory:LLL rhonchi . GI: Soft, nontender, non-distended  MS: No edema; No deformity. Neuro:  Nonfocal  Psych: Normal affect   Labs    Chemistry Recent Labs  Lab 08/10/18 0303 08/11/18 0337 08/12/18 1013  NA 138 136 136  K 4.7 4.1 4.4  CL 104 104  106  CO2 22 22 23   GLUCOSE 109* 86 106*  BUN 18 20 24*  CREATININE 1.41* 1.52* 1.26*  CALCIUM 8.4* 8.5* 8.7*  PROT 6.0*  --   --   ALBUMIN 2.7*  --   --   AST 24  --   --   ALT 22  --   --   ALKPHOS 77  --   --   BILITOT 0.8  --   --   GFRNONAA 46* 42* 53*  GFRAA 54* 49* >60  ANIONGAP 12 10 7      Hematology Recent Labs  Lab 08/09/18 1650 08/10/18 0303 08/11/18 0337  WBC 6.0 6.5 6.3  RBC 4.84 4.46 4.72  HGB 12.0 11.0* 12.0  HCT 39.2 36.1 37.7  MCV 81.0 80.9 79.9*  MCH 24.8* 24.7* 25.4*  MCHC 30.6 30.5 31.8  RDW 15.9* 15.9* 15.7*  PLT 307 312 299    Cardiac Enzymes Recent Labs  Lab 08/09/18 1650 08/10/18 0303  TROPONINI 0.14* 0.15*   No results for input(s): TROPIPOC in the last 168 hours.   BNP Recent Labs  Lab 08/09/18 1650  BNP 1,335.3*     DDimer  Recent Labs  Lab 08/09/18 1143  DDIMER 4.34*     Radiology    No results found.  Cardiac Studies   EF 20-25%   Patient Profile     43 y.o. female COPD, smoking HTN bipolar polysubstance abuse and hemoptysis BNP 1335 TTE with EF 30-35%   Assessment & Plan    CHF:  Acute systolic Care hampered by behavioral issues, lack of money and compliance with meds Seen by Dr Teressa Lower yesterday and refused cath. Started on appropriate meds in hospital Avoiding Beta blocker for now due to low output. Diuresed 2.4 L's but JVP still elevated Will continue with current medical Rx CT was negative for PE regarding hemoptysis        For questions or updates, please contact [facility] Please consult [email or web address] for contact info under        Signed, Charlton Haws, MD  08/12/2018, 1:25 PM

## 2018-08-12 NOTE — Plan of Care (Signed)
Care plans reviewed and patient is progressing.  

## 2018-08-12 NOTE — Consult Note (Signed)
[facility] Face-to-Face Psychiatry Consult   Reason for Consult:  History of schizoaffective and polysubstance use Referring Physician:  Lanae Boast, MD Patient Identification: Christine Cobb MRN:  [id number] Principal Diagnosis: Schizoaffective disorder, bipolar type (HCC) Diagnosis:  Principal Problem:   Schizoaffective disorder, bipolar type (HCC) Active Problems:   Hemoptysis   Respiratory failure (HCC)   Chest pain   Community acquired pneumonia of right lower lobe of lung (HCC)   Malnutrition of moderate degree   Total Time spent with patient: 45 minutes  Subjective:   Christine Cobb is a 43 y.o. female patient admitted to [facility] hospital for treatment of acute/chronic hypoxic respiratory failure from CHF, pleural effusion.    HPI:  Christine Cobb, 43 y.o., female patient seen face to face by this provider; chart reviewed and discussed with Dr. Lucianne Muss and treatment team on 08/12/18.  On evaluation Christine Cobb reports she was sent to hospital because of congestive heart failure by her PCP.  Patient states that she does have a history of schizoaffective disorder but she has been taking her medication as ordered and hasn't missed any doses.  Patient states that she doesn't currently have an outpatient provider but has been given resources by Child psychotherapist.  States that he PCP has been prescribing her medications.  Patient denies suicidal/self-harm/homicidal ideation, psychosis, and paranoia.  Patients psychotropic medication are Abilify 10 mg and Remeron 15 mg.  Patient states that she lives with her sister who is somewhat supportive.    During evaluation Christine Cobb is elevated up in bed; she is alert/oriented x 4; calm/cooperative; and mood congruent with affect.  Patient is speaking in a clear tone at moderate volume, and normal pace; with good eye contact.  Her thought process is coherent and relevant; There is no indication that she is currently responding  to internal/external stimuli or experiencing delusional thought content.  Patient denies suicidal/self-harm/homicidal ideation, psychosis, and paranoia.  Patient has remained calm throughout assessment and has answered questions appropriately.  Spoke with patients nurse Terri, RN who states that patient has been doing well and she has had no complaints.  States that patient was refusing to have labs drawn but that was related to she did not know why repeated labs were being drawn.  After educating the patient for the need for labs patient has been compliant and no other refusal or behavioral outburst.     Past Psychiatric History: Polysubstance use and schizoaffective disorder  Risk to Self:  No Risk to Others:  NO Prior Inpatient Therapy:    Yes "years ago" Prior Outpatient Therapy:   Yes.  No current services but has resources to set up outpatient services   Past Medical History:  Past Medical History:  Diagnosis Date  . Arm pain   . Bipolar affective disorder, currently manic, mild (HCC)   . CHF (congestive heart failure) (HCC)   . Eye globe prosthesis   . HTN (hypertension)   . Hyperthyroidism   . Sinus tachycardia     Past Surgical History:  Procedure Laterality Date  . DILATION AND CURETTAGE OF UTERUS    . EYE SURGERY     Family History:  Family History  Problem Relation Age of Onset  . Hypertension Other   . Emphysema Other   . Asthma Son   . Diabetes Maternal Uncle   . Diabetes Paternal Grandmother    Family Psychiatric  History: Denies Social History:  Social History   Substance and Sexual Activity  Alcohol Use Yes   Comment: Last drink: 1/2 beer PTA     Social History   Substance and Sexual Activity  Drug Use Yes  . Types: Marijuana   Comment: Pt sts "anything and everything"    Social History   Socioeconomic History  . Marital status: Married    Spouse name: Not on file  . Number of children: Not on file  . Years of education: Not on file  .  Highest education level: Not on file  Occupational History  . Not on file  Social Needs  . Financial resource strain: Not on file  . Food insecurity:    Worry: Not on file    Inability: Not on file  . Transportation needs:    Medical: Not on file    Non-medical: Not on file  Tobacco Use  . Smoking status: Current Every Day Smoker    Types: Cigarettes    Last attempt to quit: 08/21/2013    Years since quitting: 4.9  . Smokeless tobacco: Never Used  Substance and Sexual Activity  . Alcohol use: Yes    Comment: Last drink: 1/2 beer PTA  . Drug use: Yes    Types: Marijuana    Comment: Pt sts "anything and everything"  . Sexual activity: Yes    Birth control/protection: Injection    Comment: crack cocaine  Lifestyle  . Physical activity:    Days per week: Not on file    Minutes per session: Not on file  . Stress: Not on file  Relationships  . Social connections:    Talks on phone: Not on file    Gets together: Not on file    Attends religious service: Not on file    Active member of club or organization: Not on file    Attends meetings of clubs or organizations: Not on file    Relationship status: Not on file  Other Topics Concern  . Not on file  Social History Narrative  . Not on file   Additional Social History:    Allergies:   Allergies  Allergen Reactions  . Amoxil [Amoxicillin] Anaphylaxis  . Hydroxyzine     Other reaction(s): Bleeding  . Ketorolac Tromethamine Hives and Swelling  . Prenatal [B-Plex Plus] Nausea Only  . Tegretol [Carbamazepine] Other (See Comments)    Swelling, skin peeling, skin discoloration  . B-Plex Plus Nausea Only    Prenatal   . Tegretol [Carbamazepine] Swelling and Other (See Comments)    Skin peeling Skin discoloring     Labs:  Results for orders placed or performed during the hospital encounter of 08/09/18 (from the past 48 hour(s))  Basic metabolic panel     Status: Abnormal   Collection Time: 08/11/18  3:37 AM  Result  Value Ref Range   Sodium 136 135 - 145 mmol/L   Potassium 4.1 3.5 - 5.1 mmol/L   Chloride 104 98 - 111 mmol/L   CO2 22 22 - 32 mmol/L   Glucose, Bld 86 70 - 99 mg/dL   BUN 20 6 - 20 mg/dL   Creatinine, Ser 2.62 (H) 0.44 - 1.00 mg/dL   Calcium 8.5 (L) 8.9 - 10.3 mg/dL   GFR calc non Af Amer 42 (L) >60 mL/min   GFR calc Af Amer 49 (L) >60 mL/min   Anion gap 10 5 - 15    Comment: Performed at [facility], [address], [place], [place] [address]  Magnesium     Status: None  Collection Time: 08/11/18  3:37 AM  Result Value Ref Range   Magnesium 1.7 1.7 - 2.4 mg/dL    Comment: Performed at [facility], [address] [place], [place] [address]  Phosphorus     Status: None   Collection Time: 08/11/18  3:37 AM  Result Value Ref Range   Phosphorus 4.5 2.5 - 4.6 mg/dL    Comment: Performed at [facility], [address] [place], [place] [address]  CBC     Status: Abnormal   Collection Time: 08/11/18  3:37 AM  Result Value Ref Range   WBC 6.3 4.0 - 10.5 K/uL   RBC 4.72 3.87 - 5.11 MIL/uL   Hemoglobin 12.0 12.0 - 15.0 g/dL   HCT 09.837.7 11.936.0 - 14.746.0 %   MCV 79.9 (L) 80.0 - 100.0 fL   MCH 25.4 (L) 26.0 - 34.0 pg   MCHC 31.8 30.0 - 36.0 g/dL   RDW 82.915.7 (H) 56.211.5 - 13.015.5 %   Platelets 299 150 - 400 K/uL   nRBC 0.0 0.0 - 0.2 %    Comment: Performed at [facility], [address] [place], [place] [address]  Glucose, capillary     Status: Abnormal   Collection Time: 08/11/18  8:37 AM  Result Value Ref Range   Glucose-Capillary 127 (H) 70 - 99 mg/dL  Basic metabolic panel     Status: Abnormal   Collection Time: 08/12/18 10:13 AM  Result Value Ref Range   Sodium 136 135 - 145 mmol/L   Potassium 4.4 3.5 - 5.1 mmol/L   Chloride 106 98 - 111 mmol/L   CO2 23 22 - 32 mmol/L   Glucose, Bld 106 (H) 70 - 99 mg/dL   BUN 24 (H) 6 - 20 mg/dL   Creatinine, Ser 4.691.26 (H) 0.44 - 1.00 mg/dL   Calcium 8.7 (L) 8.9 - 10.3 mg/dL   GFR calc non Af Amer 53 (L) >60 mL/min   GFR  calc Af Amer >60 >60 mL/min   Anion gap 7 5 - 15    Comment: Performed at [facility], [address] [place], [place] [address]    Current Facility-Administered Medications  Medication Dose Route Frequency Provider Last Rate Last Dose  . ARIPiprazole (ABILIFY) tablet 10 mg  10 mg Oral Daily Bevelyn NgoGroce, Sarah F, NP   10 mg at 08/12/18 1059  . digoxin (LANOXIN) tablet 0.0625 mg  0.0625 mg Oral Daily Alford HighlandSmith, Ashley M, NP   0.625 mg at 08/12/18 1057  . feeding supplement (ENSURE ENLIVE) (ENSURE ENLIVE) liquid 237 mL  237 mL Oral BID BM Kc, Ramesh, MD   237 mL at 08/12/18 1316  . furosemide (LASIX) tablet 40 mg  40 mg Oral Daily Alford HighlandSmith, Ashley M, NP   40 mg at 08/12/18 1057  . guaiFENesin (MUCINEX) 12 hr tablet 1,200 mg  1,200 mg Oral BID Bevelyn NgoGroce, Sarah F, NP   1,200 mg at 08/12/18 1058  . guaiFENesin-dextromethorphan (ROBITUSSIN DM) 100-10 MG/5ML syrup 5 mL  5 mL Oral Q4H PRN Glory RosebushSingasani, Reddy S, MD   5 mL at 08/10/18 0003  . heparin injection 5,000 Units  5,000 Units Subcutaneous Q8H Bevelyn NgoGroce, Sarah F, NP   5,000 Units at 08/10/18 1446  . hydrOXYzine (ATARAX) 10 MG/5ML syrup 25 mg  25 mg Oral TID PRN Alyson ReedyYacoub, Wesam G, MD      . levalbuterol Pauline Aus(XOPENEX) nebulizer solution 0.63 mg  0.63 mg Nebulization Q6H PRN Tomma Lightninglalere, Adewale A, MD  0.63 mg at 08/11/18 0503  . losartan (COZAAR) tablet 25 mg  25 mg Oral Daily Alford Highland, NP   25 mg at 08/12/18 1059  . mirtazapine (REMERON) tablet 15 mg  15 mg Oral QHS Bevelyn Ngo, NP   15 mg at 08/11/18 2210  . pantoprazole (PROTONIX) EC tablet 20 mg  20 mg Oral Daily Bevelyn Ngo, NP   20 mg at 08/12/18 1059  . sodium chloride flush (NS) 0.9 % injection 3 mL  3 mL Intravenous Q12H Bevelyn Ngo, NP   3 mL at 08/12/18 1100  . spironolactone (ALDACTONE) tablet 12.5 mg  12.5 mg Oral Daily Alford Highland, NP   12.5 mg at 08/12/18 1059    Musculoskeletal: Strength & Muscle Tone: within normal limits Gait & Station: normal Patient leans: N/A  Psychiatric  Specialty Exam: Physical Exam  Constitutional: She is oriented to person, place, and time.  Respiratory: Effort normal.  Musculoskeletal: Normal range of motion.  Neurological: She is alert and oriented to person, place, and time.  Skin: Skin is warm and dry.  Psychiatric: She has a normal mood and affect. Her speech is normal and behavior is normal. Judgment and thought content normal. Cognition and memory are normal.    Review of Systems  Psychiatric/Behavioral: Negative for depression, hallucinations and suicidal ideas. Substance abuse: history of substance abuse. The patient is not nervous/anxious and does not have insomnia.     Blood pressure (!) 132/102, pulse (!) 118, temperature 97.7 F (36.5 C), temperature source Oral, resp. rate (!) 21, height 5\' 4"  (1.626 m), weight 55 kg, SpO2 95 %.Body mass index is 20.82 kg/m.  General Appearance: Casual in hospital gown  Eye Contact:  Good  Speech:  Clear and Coherent and Normal Rate  Volume:  Normal  Mood:  Appropriate  Affect:  Appropriate and Congruent  Thought Process:  Coherent and Goal Directed  Orientation:  Full (Time, Place, and Person)  Thought Content:  WDL and Logical  Suicidal Thoughts:  No  Homicidal Thoughts:  No  Memory:  Immediate;   Good Recent;   Good Remote;   Good  Judgement:  Intact  Insight:  Present  Psychomotor Activity:  Normal  Concentration:  Concentration: Good and Attention Span: Good  Recall:  Good  Fund of Knowledge:  Good  Language:  Good  Akathisia:  No  Handed:  Right  AIMS (if indicated):     Assets:  Communication Skills Desire for Improvement Housing Social Support  ADL's:  Intact  Cognition:  WNL  Sleep:        Treatment Plan Summary: Medication management Restart home psychotropic medication Abilify 10 mg daily and Remeron 15 mg Q hs Disposition: No evidence of imminent risk to self or others at present.   Patient does not meet criteria for psychiatric inpatient  admission. Supportive therapy provided about ongoing stressors.   Spoke with Terri, RN; informed of assessment, recommendation and disposition.  States she will inform hospitalist (patient provider)  Assunta Found, NP 08/12/2018 4:09 PM

## 2018-08-13 DIAGNOSIS — F250 Schizoaffective disorder, bipolar type: Secondary | ICD-10-CM

## 2018-08-13 LAB — BASIC METABOLIC PANEL
ANION GAP: 11 (ref 5–15)
BUN: 25 mg/dL — ABNORMAL HIGH (ref 6–20)
CO2: 23 mmol/L (ref 22–32)
Calcium: 8.5 mg/dL — ABNORMAL LOW (ref 8.9–10.3)
Chloride: 103 mmol/L (ref 98–111)
Creatinine, Ser: 1.24 mg/dL — ABNORMAL HIGH (ref 0.44–1.00)
GFR calc non Af Amer: 54 mL/min — ABNORMAL LOW (ref >60)
Glucose, Bld: 99 mg/dL (ref 70–99)
Potassium: 4 mmol/L (ref 3.5–5.1)
SODIUM: 137 mmol/L (ref 135–145)

## 2018-08-13 LAB — BRAIN NATRIURETIC PEPTIDE: B Natriuretic Peptide: 1153.1 pg/mL — ABNORMAL HIGH (ref 0.0–100.0)

## 2018-08-13 MED ORDER — HYDRALAZINE HCL 20 MG/ML IJ SOLN
5.0000 mg | Freq: Four times a day (QID) | INTRAMUSCULAR | Status: DC | PRN
  Administered 2018-08-13: 5 mg via INTRAVENOUS
  Filled 2018-08-13: qty 1

## 2018-08-13 NOTE — Plan of Care (Signed)
Care plans reviewed and patient is progressing.  

## 2018-08-13 NOTE — Progress Notes (Signed)
Patient with  ^BP 136/112 with BP meds scheduled for 10:00 however no PRN meds available. Paged on-call physician; will continue to monitor.

## 2018-08-13 NOTE — Evaluation (Signed)
Physical Therapy Evaluation Patient Details Name: Christine Cobb MRN: [id number] DOB: 1976-02-03 Today's Date: 08/13/2018   History of Present Illness  Pt is a 43 y.o. F with significant PMH of COPD, current smoker, HTN, bipolar, polysubstance abuse who presents with hemoptysis and positive d-dimer of 4.34. CT negative for PE. Being treated for acute on chronic hypoxic respiratory failure from CHF.  Clinical Impression  Pt admitted with above. On PT evaluation, pt presenting with significantly decreased functional mobility secondary to poor cardiovascular endurance. Ambulating 15 feet in room and fatigues extremely easily. HR 115-132 bpm. Pt declining to sit up in the chair. PT encouraged increased activity. At this point, do not feel pt would be able to complete basic ADL's/IADL's at home due to lack of energy/endurance. Also she does not seem to have adequate caregiver support, so at this time recommending SNF to maximize functional independence.     Follow Up Recommendations SNF (pending progress)    Equipment Recommendations  3in1 (PT)    Recommendations for Other Services OT consult     Precautions / Restrictions Precautions Precaution Comments: watch HR Restrictions Weight Bearing Restrictions: No      Mobility  Bed Mobility Overal bed mobility: Independent                Transfers Overall transfer level: Needs assistance Equipment used: None Transfers: Sit to/from Stand Sit to Stand: Supervision            Ambulation/Gait Ambulation/Gait assistance: Supervision Gait Distance (Feet): 15 Feet Assistive device: None Gait Pattern/deviations: Step-through pattern;Decreased stride length;Narrow base of support     General Gait Details: slow cadence  Stairs            Wheelchair Mobility    Modified Rankin (Stroke Patients Only)       Balance Overall balance assessment: No apparent balance deficits (not formally assessed)                                           Pertinent Vitals/Pain Pain Assessment: Faces Faces Pain Scale: Hurts little more Pain Location: "under my ribcage." Pain Descriptors / Indicators: Grimacing Pain Intervention(s): Monitored during session    Home Living Family/patient expects to be discharged to:: Private residence Living Arrangements: Non-relatives/Friends   Type of Home: House           Additional Comments: Pt not very forthcoming with home information    Prior Function Level of Independence: Independent         Comments: states a couple months ago she was doing "100 push ups a day."     Hand Dominance        Extremity/Trunk Assessment   Upper Extremity Assessment Upper Extremity Assessment: Overall WFL for tasks assessed    Lower Extremity Assessment Lower Extremity Assessment: Overall WFL for tasks assessed    Cervical / Trunk Assessment Cervical / Trunk Assessment: Normal  Communication   Communication: No difficulties  Cognition Arousal/Alertness: Awake/alert Behavior During Therapy: WFL for tasks assessed/performed Overall Cognitive Status: Within Functional Limits for tasks assessed                                        General Comments      Exercises     Assessment/Plan    PT Assessment  Patient needs continued PT services  PT Problem List Decreased activity tolerance;Decreased mobility;Cardiopulmonary status limiting activity       PT Treatment Interventions Gait training;Functional mobility training;Therapeutic activities;Therapeutic exercise;Balance training;Patient/family education    PT Goals (Current goals can be found in the Care Plan section)  Acute Rehab PT Goals Patient Stated Goal: "get better and not be so weak." PT Goal Formulation: With patient Time For Goal Achievement: 08/27/18 Potential to Achieve Goals: Fair    Frequency Min 3X/week   Barriers to discharge Decreased caregiver support       Co-evaluation               AM-PAC PT "6 Clicks" Mobility  Outcome Measure Help needed turning from your back to your side while in a flat bed without using bedrails?: None Help needed moving from lying on your back to sitting on the side of a flat bed without using bedrails?: None Help needed moving to and from a bed to a chair (including a wheelchair)?: None Help needed standing up from a chair using your arms (e.g., wheelchair or bedside chair)?: A Little Help needed to walk in hospital room?: A Little Help needed climbing 3-5 steps with a railing? : A Lot 6 Click Score: 20    End of Session   Activity Tolerance: Patient limited by fatigue Patient left: in bed;with call bell/phone within reach   PT Visit Diagnosis: Difficulty in walking, not elsewhere classified (R26.2)    Time: 2094-7096 PT Time Calculation (min) (ACUTE ONLY): 8 min   Charges:   PT Evaluation $PT Eval Moderate Complexity: 1 Mod         Christine Cobb, PT, DPT Acute Rehabilitation Services Pager [phone] Office [phone]   Vanetta Mulders 08/13/2018, 1:05 PM

## 2018-08-13 NOTE — Progress Notes (Signed)
PROGRESS NOTE    Christine Cobb  CBJ:[id number] DOB: 1975-10-04 DOA: 08/09/2018 PCP: Patient, No Pcp Per   Brief Narrative: 43 year old female with past medical history of COPD, congestive heart failure, hypertension, hypothyroidism, bipolar mood disorder/size affective disorder with polysubstance abuse, smoker presented to pulmonary office 08/09/2026 with cough hemoptysis dyspnea and positive d-dimer of 4.34.  Patient was sent to the [facility] for direct admission under pulmonary critical care medicine O N2/12/20.  Patient is being treated for acute on chronic hypoxic respiratory failure from CHF, pleural effusion.  Her CT was negative for PE.  Patient was being diuresed, treated nebulizer, bronchodilators.  Patient was transferred to [facility] 08/11/18  Subjective: Resting comfortably this morning.  On room air.  Heart rate remains rather uncontrolled 110s to 120s.  Still endorses dyspnea with activity but improving.  Denies chest pain, nausea, vomiting, fever, chills.  Assessment & Plan:  Acute on chronic hypoxic respiratory failure in the setting of decompensated CHF.  Patient is currently on room air.  Continue to complaint of shortness of breath/dyspnea and cough.   Acute systolic congestive heart failure EF is 20-25% last echo in 2012 with normal EF. ?New onset.   Echo showes severe TR, severe pulmonary hypertension. Cont on diuresis 40 mg daily.  Patient was started on losartan 25, [place] 12.5 mg, digoxin .125 ( if she is not going to be compliant cardio does not recommend to send her home on digoxin). Appreciate cardiology inputs..  Patient appears tachycardic in 110-120s at rest-unable to use beta-blocker due to low EF per cardiology.Cont to optimize meds, continue diuresis still has JVD present.  Positive troponin at 0.15- 0.14, flat likely from systolic dysfunction.  Patient not interested in [facility].   Stage III CKD: Baseline creatinine from January to May 2019 around  1.1-1.4. creat appears stable at below.  Continue oral diuretics.. Recent Labs  Lab 08/09/18 1650 08/10/18 0303 08/11/18 0337 08/12/18 1013 08/13/18 0358  CREATININE 1.34* 1.41* 1.52* 1.26* 1.24*    History of hyperthyroidism:TSH is stable at 0.49. Not on any home medication.  Patient asking for "thyroid medication".   COPD, not in exacerbation.  Continue bronchodilator therapy.  Smoking/tobacco abuse has not smoked in 6 days,advsied to quit.  Weight loss:?amount.  Continue to augment nutrition.   Diabetes mellitus HbA1c 5.8.  Labile, monitor CBG  Bipolar disorder/schizoaffective disorder: continue her Remeron and Abilify.  [facility], it seems she was on Depakote 750 mg daily/and monitoring her milligrams twice daily, Abilify, Aristada. She reports she sees a psychiatrist outpatient.  Mood appears to stable seen.  Patient psychiatry and medication adjusted. Polysubstance abuse: UDS positive and cocaine.  Cessation counseled.Social worker following.  Social worker has provided mental health resources to patients  DVT prophylaxis: Heparin Code Status: fullCode Family Communication: none at bedside Disposition Plan:Remains inpatient pending clinical improvement.  Continue to work with PT.   Consultants:  Cardiology PCCM  Procedures: TTE IMPRESSIONS    1. The left ventricle has severely reduced systolic function, with an ejection fraction of 20-25%. The cavity size was mildly dilated. There is mildly increased left ventricular wall thickness. Left ventricular diastolic Doppler parameters are  consistent with restrictive filling Left ventricular diffuse hypokinesis.  2. The right ventricle has moderately reduced systolic function. The cavity was mildly enlarged. There is no increase in right ventricular wall thickness. Right ventricular systolic pressure is severely elevated with an estimated pressure of 66.3 mmHg.  3. Right atrial pressure is  estimated at 15 mmHg.  4. Left atrial size was severely dilated.  5. Right atrial size was mildly dilated.  6. Trivial pericardial effusion.  7. The mitral valve is normal in structure. There is mild thickening.  8. The tricuspid valve is normal in structure. Tricuspid valve regurgitation is severe.  9. The aortic valve is tricuspid There is mild thickening of the aortic valve. 10. The pulmonic valve was normal in structure. Pulmonic valve regurgitation is mild by color flow Doppler. 11. The inferior vena cava was dilated in size with <50% respiratory variability. 12. Severe global reduction in LV systolic function; mild LVH; 4 chamber enlargement; restrictive filling; moderate RV dysfunction; mild MR; severe TR; severe pulmonary hypertension.   Antimicrobials: Anti-infectives (From admission, onward)   Start     Dose/Rate Route Frequency Ordered Stop   08/09/18 1615  doxycycline (VIBRA-TABS) tablet 100 mg  Status:  Discontinued     100 mg Oral Every 12 hours 08/09/18 1600 08/10/18 1146       Objective: Vitals:   08/13/18 0523 08/13/18 0700 08/13/18 0738 08/13/18 0900  BP: (!) 136/112  122/85 120/87  Pulse: (!) 117  (!) 117   Resp: 15 (!) 22 (!) 25 20  Temp: 98.3 F (36.8 C)  98.2 F (36.8 C) 98.7 F (37.1 C)  TempSrc: Oral  Oral Oral  SpO2: 99%  97% 96%  Weight: 54 kg     Height:        Intake/Output Summary (Last 24 hours) at 08/13/2018 1042 Last data filed at 08/13/2018 0830 Gross per 24 hour  Intake 1297 ml  Output 3400 ml  Net -2103 ml   Filed Weights   08/10/18 0415 08/11/18 0345 08/13/18 0523  Weight: 57.3 kg 55 kg 54 kg   Weight change:   Body mass index is 20.43 kg/m.  Intake/Output from previous day: 02/15 0701 - 02/16 0700 In: 1297 [P.O.:1297] Out: 2200 [Urine:2200] Intake/Output this shift: Total I/O In: 240 [P.O.:240] Out: 1200 [Urine:1200]  Examination:  General exam: Calm, comfortable, not in acute distress, older for age, thin build,  anxious.   HEENT:Oral mucosa moist, Ear/Nose WNL grossly, dentition normal. Respiratory system: Bilateral equal air entry, no crackles and wheezing, no use of accessory muscle, non tender on palpation. Cardiovascular system: regular rate and rhythm, tachycardic, S1 & S2 heard, JVD present  Gastrointestinal system: Abdomen soft, non-tender, non-distended, BS +. No hepatosplenomegaly palpable. Nervous System:Alert, awake and oriented at baseline. Able to move UE and LE, sensation intact. Extremities: No edema, distal peripheral pulses palpable.  Skin: No rashes,no icterus. MSK: Normal muscle bulk,tone, power  Medications:  Scheduled Meds: . ARIPiprazole  10 mg Oral Daily  . digoxin  0.0625 mg Oral Daily  . feeding supplement (ENSURE ENLIVE)  237 mL Oral BID BM  . furosemide  40 mg Oral Daily  . guaiFENesin  1,200 mg Oral BID  . heparin  5,000 Units Subcutaneous Q8H  . losartan  25 mg Oral Daily  . mirtazapine  15 mg Oral QHS  . pantoprazole  20 mg Oral Daily  . sodium chloride flush  3 mL Intravenous Q12H  . spironolactone  12.5 mg Oral Daily   Continuous Infusions:  Data Reviewed: I have personally reviewed following labs and imaging studies  CBC: Recent Labs  Lab 08/09/18 1650 08/10/18 0303 08/11/18 0337  WBC 6.0 6.5 6.3  NEUTROABS 3.5  --   --   HGB 12.0 11.0* 12.0  HCT 39.2 36.1 37.7  MCV 81.0 80.9  79.9*  PLT 307 312 299   Basic Metabolic Panel: Recent Labs  Lab 08/09/18 1650 08/10/18 0303 08/11/18 0337 08/12/18 1013 08/13/18 0358  NA 137 138 136 136 137  K 4.1 4.7 4.1 4.4 4.0  CL 102 104 104 106 103  CO2 23 22 22 23 23   GLUCOSE 91 109* 86 106* 99  BUN 18 18 20  24* 25*  CREATININE 1.34* 1.41* 1.52* 1.26* 1.24*  CALCIUM 8.4* 8.4* 8.5* 8.7* 8.5*  MG 2.0 1.9 1.7  --   --   PHOS  --  3.5 4.5  --   --    GFR: Estimated Creatinine Clearance: 51.4 mL/min (A) (by C-G formula based on SCr of 1.24 mg/dL (H)). Liver Function Tests: Recent Labs  Lab  08/10/18 0303  AST 24  ALT 22  ALKPHOS 77  BILITOT 0.8  PROT 6.0*  ALBUMIN 2.7*   No results for input(s): LIPASE, AMYLASE in the last 168 hours. No results for input(s): AMMONIA in the last 168 hours. Coagulation Profile: No results for input(s): INR, PROTIME in the last 168 hours. Cardiac Enzymes: Recent Labs  Lab 08/09/18 1650 08/10/18 0303  TROPONINI 0.14* 0.15*   BNP (last 3 results) No results for input(s): PROBNP in the last 8760 hours. HbA1C: No results for input(s): HGBA1C in the last 72 hours. CBG: Recent Labs  Lab 08/11/18 0837  GLUCAP 127*   Lipid Profile: No results for input(s): CHOL, HDL, LDLCALC, TRIG, CHOLHDL, LDLDIRECT in the last 72 hours. Thyroid Function Tests: No results for input(s): TSH, T4TOTAL, FREET4, T3FREE, THYROIDAB in the last 72 hours. Anemia Panel: No results for input(s): VITAMINB12, FOLATE, FERRITIN, TIBC, IRON, RETICCTPCT in the last 72 hours. Sepsis Labs: No results for input(s): PROCALCITON, LATICACIDVEN in the last 168 hours.  No results found for this or any previous visit (from the past 240 hour(s)).    Radiology Studies: No results found.    LOS: 4 days   Time spent: More than 50% of that time was spent in counseling and/or coordination of care.  Lanae Boast, MD [facility]  08/13/2018, 10:42 AM

## 2018-08-13 NOTE — Progress Notes (Signed)
Progress Note  Patient Name: Christine Cobb Date of Encounter: 08/13/2018  Primary Cardiologist: Bensimohn  Subjective   Ongoing dyspnea and CHF no chest pain   Inpatient Medications    Scheduled Meds: . ARIPiprazole  10 mg Oral Daily  . digoxin  0.0625 mg Oral Daily  . feeding supplement (ENSURE ENLIVE)  237 mL Oral BID BM  . furosemide  40 mg Oral Daily  . guaiFENesin  1,200 mg Oral BID  . heparin  5,000 Units Subcutaneous Q8H  . losartan  25 mg Oral Daily  . mirtazapine  15 mg Oral QHS  . pantoprazole  20 mg Oral Daily  . sodium chloride flush  3 mL Intravenous Q12H  . spironolactone  12.5 mg Oral Daily   Continuous Infusions:  PRN Meds: guaiFENesin-dextromethorphan, hydrALAZINE, hydrOXYzine, levalbuterol   Vital Signs    Vitals:   08/13/18 0523 08/13/18 0700 08/13/18 0738 08/13/18 0900  BP: (!) 136/112  122/85 120/87  Pulse: (!) 117  (!) 117   Resp: 15 (!) 22 (!) 25 20  Temp: 98.3 F (36.8 C)  98.2 F (36.8 C) 98.7 F (37.1 C)  TempSrc: Oral  Oral Oral  SpO2: 99%  97% 96%  Weight: 54 kg     Height:        Intake/Output Summary (Last 24 hours) at 08/13/2018 1157 Last data filed at 08/13/2018 0830 Gross per 24 hour  Intake 720 ml  Output 2400 ml  Net -1680 ml   Last 3 Weights 08/13/2018 08/12/2018 08/11/2018  Weight (lbs) 119 lb (No Data) 121 lb 4.8 oz  Weight (kg) 53.978 kg (No Data) 55.021 kg      Telemetry    NSR/ Sinus tachycardia - Personally Reviewed  ECG    SR rate 106 no acute changes  - Personally Reviewed  Physical Exam  Thin black female  GEN: No acute distress.   Neck:  JVP elevated  Cardiac: RRR, noS3 gallop PMI increased  Respiratory:LLL rhonchi . GI: Soft, nontender, non-distended  MS: No edema; No deformity. Neuro:  Nonfocal  Psych: Normal affect   Labs    Chemistry Recent Labs  Lab 08/10/18 0303 08/11/18 0337 08/12/18 1013 08/13/18 0358  NA 138 136 136 137  K 4.7 4.1 4.4 4.0  CL 104 104 106 103  CO2 22  22 23 23   GLUCOSE 109* 86 106* 99  BUN 18 20 24* 25*  CREATININE 1.41* 1.52* 1.26* 1.24*  CALCIUM 8.4* 8.5* 8.7* 8.5*  PROT 6.0*  --   --   --   ALBUMIN 2.7*  --   --   --   AST 24  --   --   --   ALT 22  --   --   --   ALKPHOS 77  --   --   --   BILITOT 0.8  --   --   --   GFRNONAA 46* 42* 53* 54*  GFRAA 54* 49* >60 >60  ANIONGAP 12 10 7 11      Hematology Recent Labs  Lab 08/09/18 1650 08/10/18 0303 08/11/18 0337  WBC 6.0 6.5 6.3  RBC 4.84 4.46 4.72  HGB 12.0 11.0* 12.0  HCT 39.2 36.1 37.7  MCV 81.0 80.9 79.9*  MCH 24.8* 24.7* 25.4*  MCHC 30.6 30.5 31.8  RDW 15.9* 15.9* 15.7*  PLT 307 312 299    Cardiac Enzymes Recent Labs  Lab 08/09/18 1650 08/10/18 0303  TROPONINI 0.14* 0.15*   No results for input(s):  TROPIPOC in the last 168 hours.   BNP Recent Labs  Lab 08/09/18 1650 08/13/18 0358  BNP 1,335.3* 1,153.1*     DDimer  Recent Labs  Lab 08/09/18 1143  DDIMER 4.34*     Radiology    No results found.  Cardiac Studies   EF 20-25%   Patient Profile     43 y.o. female COPD, smoking HTN bipolar polysubstance abuse and hemoptysis BNP 1335 TTE with EF 30-35%   Assessment & Plan    CHF:  Acute systolic Care hampered by behavioral issues, lack of money and compliance with meds Seen by Dr Teressa Lower and refused cath. Started on appropriate meds in hospital Avoiding Beta blocker for now due to low output. Good diuresis 1.6 L's yesterday  but JVP still elevated Will continue with current medical Rx CT was negative for PE regarding hemoptysis Cr/K stable Only needing lasix 40 mg PO daily for urine output        For questions or updates, please contact [facility] Please consult [email or web address] for contact info under        Signed, Charlton Haws, MD  08/13/2018, 11:57 AM

## 2018-08-14 LAB — BASIC METABOLIC PANEL
Anion gap: 11 (ref 5–15)
BUN: 20 mg/dL (ref 6–20)
CO2: 23 mmol/L (ref 22–32)
Calcium: 8.5 mg/dL — ABNORMAL LOW (ref 8.9–10.3)
Chloride: 103 mmol/L (ref 98–111)
Creatinine, Ser: 1.21 mg/dL — ABNORMAL HIGH (ref 0.44–1.00)
GFR calc Af Amer: >60 mL/min (ref >60)
GFR, EST NON AFRICAN AMERICAN: 56 mL/min — AB (ref >60)
GLUCOSE: 91 mg/dL (ref 70–99)
Potassium: 4.3 mmol/L (ref 3.5–5.1)
Sodium: 137 mmol/L (ref 135–145)

## 2018-08-14 MED ORDER — GUAIFENESIN-CODEINE 100-10 MG/5ML PO SOLN: 10.0000 mL | ORAL | Status: DC | PRN

## 2018-08-14 MED ORDER — CARVEDILOL 3.125 MG PO TABS
3.1250 mg | ORAL_TABLET | Freq: Two times a day (BID) | ORAL | Status: DC
  Administered 2018-08-14: 3.125 mg via ORAL
  Filled 2018-08-14 (×2): qty 1

## 2018-08-14 NOTE — Telephone Encounter (Signed)
Patient still admitted in hospital at this time.  

## 2018-08-14 NOTE — Progress Notes (Signed)
Progress Note  Patient Name: Christine Cobb Date of Encounter: 08/14/2018  Primary Cardiologist: Bensimohn  Subjective   Pt deneis CP   Doesn't complain of SOB      Inpatient Medications    Scheduled Meds: . ARIPiprazole  10 mg Oral Daily  . carvedilol  3.125 mg Oral BID WC  . digoxin  0.0625 mg Oral Daily  . feeding supplement (ENSURE ENLIVE)  237 mL Oral BID BM  . furosemide  40 mg Oral Daily  . guaiFENesin  1,200 mg Oral BID  . heparin  5,000 Units Subcutaneous Q8H  . losartan  25 mg Oral Daily  . mirtazapine  15 mg Oral QHS  . pantoprazole  20 mg Oral Daily  . sodium chloride flush  3 mL Intravenous Q12H  . spironolactone  12.5 mg Oral Daily   Continuous Infusions:  PRN Meds: guaiFENesin-codeine, hydrALAZINE, hydrOXYzine, levalbuterol   Vital Signs    Vitals:   08/13/18 1700 08/13/18 1942 08/14/18 0100 08/14/18 0836  BP:  118/86 120/89 (!) 110/94  Pulse:  (!) 123 (!) 117 (!) 110  Resp: (!) 35 (!) 25 (!) 27 (!) 23  Temp:  98.7 F (37.1 C) 98.7 F (37.1 C) 98.5 F (36.9 C)  TempSrc:  Oral Oral Oral  SpO2:  97% 95% 97%  Weight:      Height:        Intake/Output Summary (Last 24 hours) at 08/14/2018 1157 Last data filed at 08/14/2018 0845 Gross per 24 hour  Intake 957 ml  Output 3650 ml  Net -2693 ml   Last 3 Weights 08/13/2018 08/12/2018 08/11/2018  Weight (lbs) 119 lb (No Data) 121 lb 4.8 oz  Weight (kg) 53.978 kg (No Data) 55.021 kg      Telemetry     Sinus tachycardia - Personally Reviewed  ECG      Physical Exam  Thin black female  GEN: No acute distress.   Neck:  JVP mildly elevated    Cardiac: RRR, noS3 gallop PMI increased  Respiratory:LLL rhonchi . GI: Soft,RUQ  Tenderness  MS: No edema; No deformity. Neuro:  Nonfocal  Psych: Normal affect   Labs    Chemistry Recent Labs  Lab 08/10/18 0303  08/12/18 1013 08/13/18 0358 08/14/18 0700  NA 138   < > 136 137 137  K 4.7   < > 4.4 4.0 4.3  CL 104   < > 106 103 103  CO2  22   < > 23 23 23   GLUCOSE 109*   < > 106* 99 91  BUN 18   < > 24* 25* 20  CREATININE 1.41*   < > 1.26* 1.24* 1.21*  CALCIUM 8.4*   < > 8.7* 8.5* 8.5*  PROT 6.0*  --   --   --   --   ALBUMIN 2.7*  --   --   --   --   AST 24  --   --   --   --   ALT 22  --   --   --   --   ALKPHOS 77  --   --   --   --   BILITOT 0.8  --   --   --   --   GFRNONAA 46*   < > 53* 54* 56*  GFRAA 54*   < > >60 >60 >60  ANIONGAP 12   < > 7 11 11    < > = values in this interval not  displayed.     Hematology Recent Labs  Lab 08/09/18 1650 08/10/18 0303 08/11/18 0337  WBC 6.0 6.5 6.3  RBC 4.84 4.46 4.72  HGB 12.0 11.0* 12.0  HCT 39.2 36.1 37.7  MCV 81.0 80.9 79.9*  MCH 24.8* 24.7* 25.4*  MCHC 30.6 30.5 31.8  RDW 15.9* 15.9* 15.7*  PLT 307 312 299    Cardiac Enzymes Recent Labs  Lab 08/09/18 1650 08/10/18 0303  TROPONINI 0.14* 0.15*   No results for input(s): TROPIPOC in the last 168 hours.   BNP Recent Labs  Lab 08/09/18 1650 08/13/18 0358  BNP 1,335.3* 1,153.1*     DDimer  Recent Labs  Lab 08/09/18 1143  DDIMER 4.34*     Radiology    No results found.  Cardiac Studies  Echo 2/13 LVEDD  56 mm EF 20-25%  RVEF depressed  RAE, LAE   Est PAP 70 mm Hg    Patient Profile     43 y.o. female COPD, smoking HTN bipolar polysubstance abuse and hemoptysis BNP 1335 TTE with EF 30-35%   Assessment & Plan    CHF: Acute systolc CHF   The pt is diuresing   Review of echo shows RV and LV dysfunction    Global    Probably nonischemic   Thryoid is amazingly normal   ? Viral   ? Idiopathic    Pt is diuresing  Volume is still a little up She remains tachycardic   Concerning    BP is oK and Cr is improving which goes against severe low outpt She does not want cath   Dooes not want PICC line    WIll try low dose coreg    Follow  Continue lasi )responded to po, never got IV) Continue dig, ARB and spironolactone     Follow response       For questions or updates, please contact [facility] Please consult [email or web address] for contact info under        Signed, Dietrich Pates, MD  08/14/2018, 11:57 AM

## 2018-08-14 NOTE — Progress Notes (Signed)
PROGRESS NOTE    Christine Cobb  BWL:[id number] DOB: 27-Nov-1975 DOA: 08/09/2018 PCP: Patient, No Pcp Per   Brief Narrative: 43 year old female with past medical history of COPD, congestive heart failure, hypertension, hypothyroidism, bipolar mood disorder/size affective disorder with polysubstance abuse, smoker presented to pulmonary office 08/09/2026 with cough hemoptysis dyspnea and positive d-dimer of 4.34.  Patient was sent to the [facility] for direct admission under pulmonary critical care medicine O N2/12/20.  Patient is being treated for acute on chronic hypoxic respiratory failure from CHF, pleural effusion.  Her CT was negative for PE.  Patient was being diuresed, treated nebulizer, bronchodilators.  [facility] was transferred to [facility] 08/11/18.  Echo showed low EF 20-25%, cardiology was consulted and is being diuresed  Patient remains persistently tachycardic and symptomatic.  Subjective: Complains of ongoing cough, also dyspnea with ambulation to the bathroom improving.  Denies chest pain, nausea vomiting.  Assessment & Plan:  Acute systolic congestive heart failure EF is 20-25% last echo in 2012 with normal EF. New onset.   Echo also shows severe TR, severe pulmonary hypertension.  Patient remains tachycardic unable to use beta-blocker, on digoxin.  Remains symptomatic with cough, DOE w/ ambulation to the restroom. JVD + and on lasix. Appears better, JVD minimal. Cont lasix 40 mg dail, losartan 25, [place] 12.5 mg, digoxin  0.06 ( if she is not going to be compliant cardio does not recommend to send her home on digoxin). Still tachycardic in 110s at rest.  Appreciate cardiology input on board-planning to start on Coreg.  Will monitor her response.Cont PT OT- suggest SNF.  Acute on chronic hypoxic respiratory failure in the setting of decompensated CHF> respiratory status stable, currently on room air.   Positive troponin at 0.15- 0.14, flat likely from systolic  dysfunction.  Patient not interested in [facility].   Stage III CKD: Baseline creatinine from January to May 2019 around 1.1-1.4. creat appears stable as below.  Continue oral diuretics.. Recent Labs  Lab 08/10/18 0303 08/11/18 0337 08/12/18 1013 08/13/18 0358 08/14/18 0700  CREATININE 1.41* 1.52* 1.26* 1.24* 1.21*   History of hyperthyroidism:TSH is stable at 0.49. Not on any home medication at home.   COPD, not in exacerbation.  Continue bronchodilator therapy.add Robitussin with codeine.  Smoking/tobacco abuse has not smoked in 6 days,advsied to quit.  Weight loss:?amount.  Continue to augment nutrition.   Diabetes mellitus HbA1c 5.8.  Labile, monitor CBG  Bipolar disorder/schizoaffective disorder: continue her Remeron and Abilify.  [facility]. She reports she sees a psychiatrist outpatient.  Mood appears to stable,  Seen by psych here and cont on Abilify 10 mg, Remeron 15 mg nightly as per psychiatry.  No need for current inpatient psychiatric admissions.  Polysubstance abuse: UDS positive and cocaine.  Cessation counseled.Social worker following.  Social worker has provided mental health resources to patients  DVT prophylaxis: Heparin Code Status: fullCode Family Communication: none at bedside Disposition Plan: PT OT recommends a skilled nursing facility.  Plan SNF once euvolemic, still w persistent tachycardia.   Consultants:  Cardiology PCCM Psychiatry  Procedures: TTE   1. The left ventricle has severely reduced systolic function, with an ejection fraction of 20-25%. The cavity size was mildly dilated. There is mildly increased left ventricular wall thickness. Left ventricular diastolic Doppler parameters are  consistent with restrictive filling Left ventricular diffuse hypokinesis.  2. The right ventricle has moderately reduced systolic function. The cavity was mildly enlarged. There is no increase in  right ventricular wall thickness. Right  ventricular systolic pressure is severely elevated with an estimated pressure of 66.3 mmHg.  3. Right atrial pressure is estimated at 15 mmHg.  4. Left atrial size was severely dilated.  5. Right atrial size was mildly dilated.  6. Trivial pericardial effusion.  7. The mitral valve is normal in structure. There is mild thickening.  8. The tricuspid valve is normal in structure. Tricuspid valve regurgitation is severe.  9. The aortic valve is tricuspid There is mild thickening of the aortic valve. 10. The pulmonic valve was normal in structure. Pulmonic valve regurgitation is mild by color flow Doppler. 11. The inferior vena cava was dilated in size with <50% respiratory variability. 12. Severe global reduction in LV systolic function; mild LVH; 4 chamber enlargement; restrictive filling; moderate RV dysfunction; mild MR; severe TR; severe pulmonary hypertension.   Antimicrobials: Anti-infectives (From admission, onward)   Start     Dose/Rate Route Frequency Ordered Stop   08/09/18 1615  doxycycline (VIBRA-TABS) tablet 100 mg  Status:  Discontinued     100 mg Oral Every 12 hours 08/09/18 1600 08/10/18 1146       Objective: Vitals:   08/13/18 1700 08/13/18 1942 08/14/18 0100 08/14/18 0836  BP:  118/86 120/89 (!) 110/94  Pulse:  (!) 123 (!) 117 (!) 110  Resp: (!) 35 (!) 25 (!) 27 (!) 23  Temp:  98.7 F (37.1 C) 98.7 F (37.1 C) 98.5 F (36.9 C)  TempSrc:  Oral Oral Oral  SpO2:  97% 95% 97%  Weight:      Height:        Intake/Output Summary (Last 24 hours) at 08/14/2018 0925 Last data filed at 08/14/2018 0845 Gross per 24 hour  Intake 957 ml  Output 3650 ml  Net -2693 ml   Filed Weights   08/10/18 0415 08/11/18 0345 08/13/18 0523  Weight: 57.3 kg 55 kg 54 kg   Weight change:   Body mass index is 20.43 kg/m.  Intake/Output from previous day: 02/16 0701 - 02/17 0700 In: 957 [P.O.:957] Out: 4850 [Urine:4850] Intake/Output this shift: Total I/O In: 240  [P.O.:240] Out: -   Examination: General exam: Calm, comfortable, thin, anxious, coughing, older for age. HEENT:Oral mucosa moist, Ear/Nose WNL grossly, dentition normal. Respiratory system: Bilateral equal air entry, no crackles and wheezing, no use of accessory muscle, non tender on palpation. Cardiovascular system: regular rate and rhythm, tachycardic, S1 & S2 heard, No JVD/murmurs. Gastrointestinal system: Abdomen soft, non-tender, non-distended, BS +. No hepatosplenomegaly palpable. Nervous System:Alert, awake and oriented at baseline. Able to move UE and LE, sensation intact. Extremities: No edema, distal peripheral pulses palpable.  Skin: No rashes,no icterus. MSK: Normal muscle bulk,tone, power  Medications:  Scheduled Meds: . ARIPiprazole  10 mg Oral Daily  . digoxin  0.0625 mg Oral Daily  . feeding supplement (ENSURE ENLIVE)  237 mL Oral BID BM  . furosemide  40 mg Oral Daily  . guaiFENesin  1,200 mg Oral BID  . heparin  5,000 Units Subcutaneous Q8H  . losartan  25 mg Oral Daily  . mirtazapine  15 mg Oral QHS  . pantoprazole  20 mg Oral Daily  . sodium chloride flush  3 mL Intravenous Q12H  . spironolactone  12.5 mg Oral Daily   Continuous Infusions:  Data Reviewed: I have personally reviewed following labs and imaging studies  CBC: Recent Labs  Lab 08/09/18 1650 08/10/18 0303 08/11/18 0337  WBC 6.0 6.5 6.3  NEUTROABS 3.5  --   --  HGB 12.0 11.0* 12.0  HCT 39.2 36.1 37.7  MCV 81.0 80.9 79.9*  PLT 307 312 299   Basic Metabolic Panel: Recent Labs  Lab 08/09/18 1650 08/10/18 0303 08/11/18 0337 08/12/18 1013 08/13/18 0358 08/14/18 0700  NA 137 138 136 136 137 137  K 4.1 4.7 4.1 4.4 4.0 4.3  CL 102 104 104 106 103 103  CO2 23 22 22 23 23 23   GLUCOSE 91 109* 86 106* 99 91  BUN 18 18 20  24* 25* 20  CREATININE 1.34* 1.41* 1.52* 1.26* 1.24* 1.21*  CALCIUM 8.4* 8.4* 8.5* 8.7* 8.5* 8.5*  MG 2.0 1.9 1.7  --   --   --   PHOS  --  3.5 4.5  --   --   --     GFR: Estimated Creatinine Clearance: 52.7 mL/min (A) (by C-G formula based on SCr of 1.21 mg/dL (H)). Liver Function Tests: Recent Labs  Lab 08/10/18 0303  AST 24  ALT 22  ALKPHOS 77  BILITOT 0.8  PROT 6.0*  ALBUMIN 2.7*   No results for input(s): LIPASE, AMYLASE in the last 168 hours. No results for input(s): AMMONIA in the last 168 hours. Coagulation Profile: No results for input(s): INR, PROTIME in the last 168 hours. Cardiac Enzymes: Recent Labs  Lab 08/09/18 1650 08/10/18 0303  TROPONINI 0.14* 0.15*   BNP (last 3 results) No results for input(s): PROBNP in the last 8760 hours. HbA1C: No results for input(s): HGBA1C in the last 72 hours. CBG: Recent Labs  Lab 08/11/18 0837  GLUCAP 127*   Lipid Profile: No results for input(s): CHOL, HDL, LDLCALC, TRIG, CHOLHDL, LDLDIRECT in the last 72 hours. Thyroid Function Tests: No results for input(s): TSH, T4TOTAL, FREET4, T3FREE, THYROIDAB in the last 72 hours. Anemia Panel: No results for input(s): VITAMINB12, FOLATE, FERRITIN, TIBC, IRON, RETICCTPCT in the last 72 hours. Sepsis Labs: No results for input(s): PROCALCITON, LATICACIDVEN in the last 168 hours.  No results found for this or any previous visit (from the past 240 hour(s)).    Radiology Studies: No results found.    LOS: 5 days   Time spent: More than 50% of that time was spent in counseling and/or coordination of care.  Lanae Boast, MD [facility]  08/14/2018, 9:25 AM

## 2018-08-15 LAB — BASIC METABOLIC PANEL
Anion gap: 8 (ref 5–15)
BUN: 25 mg/dL — ABNORMAL HIGH (ref 6–20)
CO2: 24 mmol/L (ref 22–32)
CREATININE: 1.17 mg/dL — AB (ref 0.44–1.00)
Calcium: 8.9 mg/dL (ref 8.9–10.3)
Chloride: 103 mmol/L (ref 98–111)
GFR calc Af Amer: >60 mL/min (ref >60)
GFR, EST NON AFRICAN AMERICAN: 58 mL/min — AB (ref >60)
Glucose, Bld: 100 mg/dL — ABNORMAL HIGH (ref 70–99)
Potassium: 4.1 mmol/L (ref 3.5–5.1)
Sodium: 135 mmol/L (ref 135–145)

## 2018-08-15 MED ORDER — CARVEDILOL 6.25 MG PO TABS
6.2500 mg | ORAL_TABLET | Freq: Two times a day (BID) | ORAL | Status: DC
  Administered 2018-08-15: 6.25 mg via ORAL
  Filled 2018-08-15 (×2): qty 1

## 2018-08-15 NOTE — Telephone Encounter (Signed)
Patient is still currently admitted to the hospital will hold in the box.

## 2018-08-15 NOTE — Progress Notes (Signed)
Pt q AM sugar check refusing pt states "Im not a diabetic patient"

## 2018-08-15 NOTE — Care Management Important Message (Signed)
Important Message  Patient Details  Name: Cali Kippen MRN: [id number] Date of Birth: September 04, 1975   Medicare Important Message Given:  Yes    Gabrien Mentink P Temitope Flammer 08/15/2018, 12:18 PM

## 2018-08-15 NOTE — Progress Notes (Signed)
CSW continues to follow patient for discharge planning needs.   Antony Blackbird, [facility] Clinical Social Worker [phone]

## 2018-08-15 NOTE — Progress Notes (Signed)
Progress Note  Patient Name: Christine Cobb Date of Encounter: 08/15/2018  Primary Cardiologist: Bensimohn  Subjective   Patient tired  Deneis CP   Breathing is fair    Inpatient Medications    Scheduled Meds: . ARIPiprazole  10 mg Oral Daily  . carvedilol  3.125 mg Oral BID WC  . digoxin  0.0625 mg Oral Daily  . feeding supplement (ENSURE ENLIVE)  237 mL Oral BID BM  . furosemide  40 mg Oral Daily  . guaiFENesin  1,200 mg Oral BID  . heparin  5,000 Units Subcutaneous Q8H  . losartan  25 mg Oral Daily  . mirtazapine  15 mg Oral QHS  . pantoprazole  20 mg Oral Daily  . sodium chloride flush  3 mL Intravenous Q12H  . spironolactone  12.5 mg Oral Daily   Continuous Infusions:  PRN Meds: guaiFENesin-codeine, hydrALAZINE, hydrOXYzine, levalbuterol   Vital Signs    Vitals:   08/14/18 2224 08/15/18 0033 08/15/18 0444 08/15/18 0500  BP:  100/80 100/72   Pulse:  (!) 109 100   Resp: 20 18 13 20   Temp:  98.3 F (36.8 C) 98.4 F (36.9 C)   TempSrc:  Oral Oral   SpO2:  97% 97%   Weight:    53.9 kg  Height:        Intake/Output Summary (Last 24 hours) at 08/15/2018 0832 Last data filed at 08/14/2018 1505 Gross per 24 hour  Intake 240 ml  Output 1600 ml  Net -1360 ml   Last 3 Weights 08/15/2018 08/13/2018 08/12/2018  Weight (lbs) 118 lb 13.3 oz 119 lb (No Data)  Weight (kg) 53.9 kg 53.978 kg (No Data)      Telemetry     Sinus tachycardia100 to 110s   ECG      Physical Exam  Thin black female  GEN: No acute distress.   Neck:  JVP is normal       Cardiac: RRR, noS3 gallop PMI increased  Respiratory  Relatively clear   . GI: Soft,RUQ  Tenderness  MS: No edema; No deformity. Neuro:  Nonfocal  Psych: Normal affect   Labs    Chemistry Recent Labs  Lab 08/10/18 0303  08/13/18 0358 08/14/18 0700 08/15/18 0305  NA 138   < > 137 137 135  K 4.7   < > 4.0 4.3 4.1  CL 104   < > 103 103 103  CO2 22   < > 23 23 24   GLUCOSE 109*   < > 99 91 100*  BUN  18   < > 25* 20 25*  CREATININE 1.41*   < > 1.24* 1.21* 1.17*  CALCIUM 8.4*   < > 8.5* 8.5* 8.9  PROT 6.0*  --   --   --   --   ALBUMIN 2.7*  --   --   --   --   AST 24  --   --   --   --   ALT 22  --   --   --   --   ALKPHOS 77  --   --   --   --   BILITOT 0.8  --   --   --   --   GFRNONAA 46*   < > 54* 56* 58*  GFRAA 54*   < > >60 >60 >60  ANIONGAP 12   < > 11 11 8    < > = values in this interval not displayed.  Hematology Recent Labs  Lab 08/09/18 1650 08/10/18 0303 08/11/18 0337  WBC 6.0 6.5 6.3  RBC 4.84 4.46 4.72  HGB 12.0 11.0* 12.0  HCT 39.2 36.1 37.7  MCV 81.0 80.9 79.9*  MCH 24.8* 24.7* 25.4*  MCHC 30.6 30.5 31.8  RDW 15.9* 15.9* 15.7*  PLT 307 312 299    Cardiac Enzymes Recent Labs  Lab 08/09/18 1650 08/10/18 0303  TROPONINI 0.14* 0.15*   No results for input(s): TROPIPOC in the last 168 hours.   BNP Recent Labs  Lab 08/09/18 1650 08/13/18 0358  BNP 1,335.3* 1,153.1*     DDimer  Recent Labs  Lab 08/09/18 1143  DDIMER 4.34*     Radiology    No results found.  Cardiac Studies  Echo 2/13 LVEDD  56 mm EF 20-25%  RVEF depressed  RAE, LAE   Est PAP 70 mm Hg    Patient Profile     43 y.o. female COPD, smoking HTN bipolar polysubstance abuse and hemoptysis BNP 1335 TTE with EF 30-35%   Assessment & Plan    CHF: Etiology unclear, probably nonischemic due to biventricular dysfuntion and lack of CP HR is sl better    WOuld increase coreg   Volume is OK Hold further lasix for now    I have stopped digoxin  WIll have dietary review with patient low Na diet    Dietrich Pates MD   For questions or updates, please contact [facility] Please consult [email or web address] for contact info under        Signed, Dietrich Pates, MD  08/15/2018, 8:32 AM

## 2018-08-15 NOTE — Progress Notes (Signed)
Physical Therapy Treatment Patient Details Name: Christine Cobb MRN: [id number] DOB: 03-Feb-1976 Today's Date: 08/15/2018    History of Present Illness Pt is a 43 y.o. F with significant PMH of COPD, current smoker, HTN, bipolar, polysubstance abuse who presents with hemoptysis and positive d-dimer of 4.34. CT negative for PE. Being treated for acute on chronic hypoxic respiratory failure from CHF.    PT Comments    Patient with noted improvements in activity tolerance and mobility. No physical assist required, ambulating to bathroom, performed functional tasks independently, then ambulated in halls with no physical assist required. Reports 2/4 DOE with increased ambulation distance. Denies pain. HR did elevate with activity to 124. Recommendations updated to reflect d/c home upon acute discharge given overall improvements. Will continue to follow accordingly.   Follow Up Recommendations  Home health PT;Supervision - Intermittent     Equipment Recommendations  3in1 (PT)    Recommendations for Other Services OT consult     Precautions / Restrictions Precautions Precaution Comments: watch HR Restrictions Weight Bearing Restrictions: No    Mobility  Bed Mobility Overal bed mobility: Independent                Transfers Overall transfer level: Needs assistance Equipment used: None Transfers: Sit to/from Stand Sit to Stand: Independent            Ambulation/Gait Ambulation/Gait assistance: Supervision Gait Distance (Feet): 120 Feet Assistive device: None Gait Pattern/deviations: Step-through pattern;Decreased stride length;Narrow base of support Gait velocity: decreased Gait velocity interpretation: 1.31 - 2.62 ft/sec, indicative of limited community ambulator General Gait Details: No noted instability, modest decrease in cadence, elevation in HR to 124 with activity, reports 2/4 DOE.    Stairs             Wheelchair Mobility    Modified Rankin  (Stroke Patients Only)       Balance Overall balance assessment: No apparent balance deficits (not formally assessed)                                          Cognition Arousal/Alertness: Awake/alert Behavior During Therapy: WFL for tasks assessed/performed Overall Cognitive Status: Within Functional Limits for tasks assessed                                        Exercises      General Comments        Pertinent Vitals/Pain Pain Assessment: No/denies pain Faces Pain Scale: Hurts little more Pain Intervention(s): Monitored during session    Home Living                      Prior Function            PT Goals (current goals can now be found in the care plan section) Acute Rehab PT Goals Patient Stated Goal: "get better and not be so weak." PT Goal Formulation: With patient Time For Goal Achievement: 08/27/18 Potential to Achieve Goals: Fair Progress towards PT goals: Progressing toward goals    Frequency    Min 3X/week      PT Plan Discharge plan needs to be updated    Co-evaluation              AM-PAC PT "6 Clicks" Mobility   Outcome  Measure  Help needed turning from your back to your side while in a flat bed without using bedrails?: None Help needed moving from lying on your back to sitting on the side of a flat bed without using bedrails?: None Help needed moving to and from a bed to a chair (including a wheelchair)?: None Help needed standing up from a chair using your arms (e.g., wheelchair or bedside chair)?: A Little Help needed to walk in hospital room?: A Little Help needed climbing 3-5 steps with a railing? : A Lot 6 Click Score: 20    End of Session   Activity Tolerance: Patient limited by fatigue Patient left: in bed;with call bell/phone within reach   PT Visit Diagnosis: Difficulty in walking, not elsewhere classified (R26.2)     Time: 1020-1036 PT Time Calculation (min) (ACUTE ONLY):  16 min  Charges:  $Gait Training: 8-22 mins                     Charlotte Crumb, PT DPT  Board Certified Neurologic Specialist Acute Rehabilitation Services Pager [phone] Office [phone]    Fabio Asa 08/15/2018, 10:36 AM

## 2018-08-15 NOTE — Progress Notes (Signed)
PROGRESS NOTE    Christine Cobb  AFB:[id number] DOB: 09/30/1975 DOA: 08/09/2018 PCP: Patient, No Pcp Per   Brief Narrative: 43 year old female with past medical history of COPD, congestive heart failure, hypertension, hypothyroidism, bipolar mood disorder/size affective disorder with polysubstance abuse, smoker presented to pulmonary office 08/09/2026 with cough hemoptysis dyspnea and positive d-dimer of 4.34.  Patient was sent to the [facility] for direct admission under pulmonary critical care medicine O N2/12/20.  Patient is being treated for acute on chronic hypoxic respiratory failure from CHF, pleural effusion.  Her CT was negative for PE.  Patient was being diuresed, treated nebulizer, bronchodilators.    Patient was transferred to [facility] 08/11/18.  Echo showed low EF 20-25%, cardiology was consulted and is being diuresed  Patient remains persistently tachycardic and symptomatic. Diuresed well, on oral diuretics.  Initiated on Coreg 2/17 for ongoing tachycardia.  Subjective: No acute events overnight. Heart rate mostly in low 100 on rest. Per nursing patient not doing much, is mostly on the bed.  Assessment & Plan:  New onset Acute systolic CHF, EF is 20-25% last echo in 2012 with normal EF. Echo also shows severe TR, severe pulmonary hypertension. Sob and cough better today. Cont lasix 40, losartan [place]. Stopping Digoxin.Started on coreg 6.25 mg 2/17 for tachycardia, increasing dose today due to persistent tachycardia. Overall euvolumic and lasix stopped per cardsio  Acute on chronic hypoxic respiratory failure in the setting of decompensated CHF> respiratory status stable, currently on room air.   Positive troponin at 0.15- 0.14, flat likely from systolic dysfunction.  Patient not interested in [facility].   Stage III CKD: Baseline creatinine from January to May 2019 around 1.1-1.4. it is stable   on oral diuretics.. Recent Labs  Lab 08/11/18 0337 08/12/18 1013  08/13/18 0358 08/14/18 0700 08/15/18 0305  CREATININE 1.52* 1.26* 1.24* 1.21* 1.17*   History of hyperthyroidism:TSH is stable at 0.49. Not on any home medication at home. Needs clsoe f/u TSH w pcp.   COPD, not in exacerbation.Continue bronchodilator therapy. Felt better after Robitussin with codeine.  Smoking/tobacco abuse has not smoked in 6 days,advsied to quit.  Weight loss:?amount.  Continue to augment nutrition.   Diabetes mellitus? HbA1c here is 5.8. refusing cbg  Bipolar disorder/schizoaffective disorder: continue her Remeron and Abilify.  [facility]. She reports she sees a psychiatrist outpatient.  Mood appears to stable,  Seen by psych here and cont on Abilify 10 mg, Remeron 15 mg nightly as per psychiatry.  No need for current inpatient psychiatric admissions.  Polysubstance abuse: UDS positive and cocaine.  Cessation counseled.Social worker following.  Social worker has provided mental health resources to patients  DVT prophylaxis: Heparin Code Status: fullCode Family Communication: none at bedside Disposition Plan: PT/OT recommends HHPT. No need for SNF. Disposition once okay with cardiology.  Consultants:  Cardiology PCCM Psychiatry  Procedures: TTE   1. The left ventricle has severely reduced systolic function, with an ejection fraction of 20-25%. The cavity size was mildly dilated. There is mildly increased left ventricular wall thickness. Left ventricular diastolic Doppler parameters are  consistent with restrictive filling Left ventricular diffuse hypokinesis.  2. The right ventricle has moderately reduced systolic function. The cavity was mildly enlarged. There is no increase in right ventricular wall thickness. Right ventricular systolic pressure is severely elevated with an estimated pressure of 66.3 mmHg.  3. Right atrial pressure is estimated at 15 mmHg.  4. Left atrial size was severely dilated.  5. Right atrial size was  mildly dilated.  6. Trivial pericardial effusion.  7. The mitral valve is normal in structure. There is mild thickening.  8. The tricuspid valve is normal in structure. Tricuspid valve regurgitation is severe.  9. The aortic valve is tricuspid There is mild thickening of the aortic valve. 10. The pulmonic valve was normal in structure. Pulmonic valve regurgitation is mild by color flow Doppler. 11. The inferior vena cava was dilated in size with <50% respiratory variability. 12. Severe global reduction in LV systolic function; mild LVH; 4 chamber enlargement; restrictive filling; moderate RV dysfunction; mild MR; severe TR; severe pulmonary hypertension.   Antimicrobials: Anti-infectives (From admission, onward)   Start     Dose/Rate Route Frequency Ordered Stop   08/09/18 1615  doxycycline (VIBRA-TABS) tablet 100 mg  Status:  Discontinued     100 mg Oral Every 12 hours 08/09/18 1600 08/10/18 1146       Objective: Vitals:   08/15/18 0033 08/15/18 0444 08/15/18 0500 08/15/18 0930  BP: 100/80 100/72  103/72  Pulse: (!) 109 100  (!) 108  Resp: 18 13 20 20   Temp: 98.3 F (36.8 C) 98.4 F (36.9 C)  98.7 F (37.1 C)  TempSrc: Oral Oral  Oral  SpO2: 97% 97%  99%  Weight:   53.9 kg   Height:        Intake/Output Summary (Last 24 hours) at 08/15/2018 1032 Last data filed at 08/15/2018 1610 Gross per 24 hour  Intake 240 ml  Output 1600 ml  Net -1360 ml   Filed Weights   08/11/18 0345 08/13/18 0523 08/15/18 0500  Weight: 55 kg 54 kg 53.9 kg   Weight change:   Body mass index is 20.4 kg/m.  Intake/Output from previous day: 02/17 0701 - 02/18 0700 In: 240 [P.O.:240] Out: 1600 [Urine:1600] Intake/Output this shift: Total I/O In: 240 [P.O.:240] Out: -   Examination:  General exam: Calm, comfortable, not in acute distress, older for age, average built.  HEENT:Oral mucosa moist, Ear/Nose WNL grossly, dentition normal. Respiratory system: Bilateral equal air entry, no  crackles and wheezing, no use of accessory muscle, non tender on palpation. Cardiovascular system: regular rate and rhythm, tachycardic, S1 & S2 heard, No JVD/murmurs. Gastrointestinal system: Abdomen soft, non-tender, non-distended, BS +. No hepatosplenomegaly palpable. Nervous System:Alert, awake and oriented at baseline. Able to move UE and LE, sensation intact. Extremities: No edema, distal peripheral pulses palpable.  Skin: No rashes,no icterus. MSK: Normal muscle bulk,tone, power  Medications:  Scheduled Meds: . ARIPiprazole  10 mg Oral Daily  . carvedilol  6.25 mg Oral BID WC  . feeding supplement (ENSURE ENLIVE)  237 mL Oral BID BM  . guaiFENesin  1,200 mg Oral BID  . heparin  5,000 Units Subcutaneous Q8H  . losartan  25 mg Oral Daily  . mirtazapine  15 mg Oral QHS  . pantoprazole  20 mg Oral Daily  . sodium chloride flush  3 mL Intravenous Q12H   Continuous Infusions:  Data Reviewed: I have personally reviewed following labs and imaging studies  CBC: Recent Labs  Lab 08/09/18 1650 08/10/18 0303 08/11/18 0337  WBC 6.0 6.5 6.3  NEUTROABS 3.5  --   --   HGB 12.0 11.0* 12.0  HCT 39.2 36.1 37.7  MCV 81.0 80.9 79.9*  PLT 307 312 299   Basic Metabolic Panel: Recent Labs  Lab 08/09/18 1650 08/10/18 0303 08/11/18 0337 08/12/18 1013 08/13/18 0358 08/14/18 0700 08/15/18 0305  NA 137 138  136 136 137 137 135  K 4.1 4.7 4.1 4.4 4.0 4.3 4.1  CL 102 104 104 106 103 103 103  CO2 23 22 22 23 23 23 24   GLUCOSE 91 109* 86 106* 99 91 100*  BUN 18 18 20  24* 25* 20 25*  CREATININE 1.34* 1.41* 1.52* 1.26* 1.24* 1.21* 1.17*  CALCIUM 8.4* 8.4* 8.5* 8.7* 8.5* 8.5* 8.9  MG 2.0 1.9 1.7  --   --   --   --   PHOS  --  3.5 4.5  --   --   --   --    GFR: Estimated Creatinine Clearance: 54.4 mL/min (A) (by C-G formula based on SCr of 1.17 mg/dL (H)). Liver Function Tests: Recent Labs  Lab 08/10/18 0303  AST 24  ALT 22  ALKPHOS 77  BILITOT 0.8  PROT 6.0*  ALBUMIN 2.7*    No results for input(s): LIPASE, AMYLASE in the last 168 hours. No results for input(s): AMMONIA in the last 168 hours. Coagulation Profile: No results for input(s): INR, PROTIME in the last 168 hours. Cardiac Enzymes: Recent Labs  Lab 08/09/18 1650 08/10/18 0303  TROPONINI 0.14* 0.15*   BNP (last 3 results) No results for input(s): PROBNP in the last 8760 hours. HbA1C: No results for input(s): HGBA1C in the last 72 hours. CBG: Recent Labs  Lab 08/11/18 0837  GLUCAP 127*   Lipid Profile: No results for input(s): CHOL, HDL, LDLCALC, TRIG, CHOLHDL, LDLDIRECT in the last 72 hours. Thyroid Function Tests: No results for input(s): TSH, T4TOTAL, FREET4, T3FREE, THYROIDAB in the last 72 hours. Anemia Panel: No results for input(s): VITAMINB12, FOLATE, FERRITIN, TIBC, IRON, RETICCTPCT in the last 72 hours. Sepsis Labs: No results for input(s): PROCALCITON, LATICACIDVEN in the last 168 hours.  No results found for this or any previous visit (from the past 240 hour(s)).    Radiology Studies: No results found.    LOS: 6 days   Time spent: More than 50% of that time was spent in counseling and/or coordination of care.  Lanae Boast, MD [facility]  08/15/2018, 10:32 AM

## 2018-08-16 DIAGNOSIS — E440 Moderate protein-calorie malnutrition: Secondary | ICD-10-CM

## 2018-08-16 DIAGNOSIS — I429 Cardiomyopathy, unspecified: Secondary | ICD-10-CM

## 2018-08-16 MED ORDER — DIGOXIN 125 MCG PO TABS
0.1250 mg | ORAL_TABLET | Freq: Every day | ORAL | Status: DC
  Administered 2018-08-16 – 2018-08-17 (×2): 0.125 mg via ORAL
  Filled 2018-08-16 (×2): qty 1

## 2018-08-16 MED ORDER — SODIUM CHLORIDE 0.9 % IV SOLN
INTRAVENOUS | Status: AC
  Administered 2018-08-16: 21:00:00 via INTRAVENOUS

## 2018-08-16 NOTE — Progress Notes (Addendum)
Progress Note  Patient Name: Christine Cobb Date of Encounter: 08/16/2018  Primary Cardiologist: New to [facility] (remotely seen by [facility] in 2014)  Subjective   No complaints this morning. Denies CP and dyspnea. Resting HR in the 110s but asymptomatic. She notes marked improvement in regards to LEE. Completely resolved. Able to lay flat w/o orthopnea or PND.    Inpatient Medications    Scheduled Meds: . ARIPiprazole  10 mg Oral Daily  . carvedilol  6.25 mg Oral BID WC  . feeding supplement (ENSURE ENLIVE)  237 mL Oral BID BM  . guaiFENesin  1,200 mg Oral BID  . heparin  5,000 Units Subcutaneous Q8H  . losartan  25 mg Oral Daily  . mirtazapine  15 mg Oral QHS  . pantoprazole  20 mg Oral Daily  . sodium chloride flush  3 mL Intravenous Q12H   Continuous Infusions:  PRN Meds: guaiFENesin-codeine, hydrOXYzine, levalbuterol   Vital Signs    Vitals:   08/16/18 0333 08/16/18 0508 08/16/18 0749 08/16/18 1007  BP: 100/70  94/65 93/70  Pulse: 99  (!) 102 (!) 110  Resp: (!) 28 (!) 23 18   Temp: 98.3 F (36.8 C)  97.9 F (36.6 C)   TempSrc: Oral  Oral   SpO2: 99%  100%   Weight:  53.7 kg    Height:        Intake/Output Summary (Last 24 hours) at 08/16/2018 1016 Last data filed at 08/16/2018 1007 Gross per 24 hour  Intake 743 ml  Output 1000 ml  Net -257 ml   Last 3 Weights 08/16/2018 08/15/2018 08/13/2018  Weight (lbs) 118 lb 6.2 oz 118 lb 13.3 oz 119 lb  Weight (kg) 53.7 kg 53.9 kg 53.978 kg      Telemetry    Sinus tach 110s- Personally Reviewed  ECG    08/10/18 sinus tachycardia 106 bpm - Personally Reviewed  Physical Exam   GEN: thin appearing 43 y/o AAF in No acute distress.   Neck: No JVD Cardiac: regular rhythm w/ tachy rate Respiratory: Clear to auscultation bilaterally. GI: Soft, nontender, non-distended  MS: No edema; No deformity. Neuro:  Nonfocal  Psych: Normal affect   Labs    Chemistry Recent Labs  Lab 08/10/18 0303   08/13/18 0358 08/14/18 0700 08/15/18 0305  NA 138   < > 137 137 135  K 4.7   < > 4.0 4.3 4.1  CL 104   < > 103 103 103  CO2 22   < > 23 23 24   GLUCOSE 109*   < > 99 91 100*  BUN 18   < > 25* 20 25*  CREATININE 1.41*   < > 1.24* 1.21* 1.17*  CALCIUM 8.4*   < > 8.5* 8.5* 8.9  PROT 6.0*  --   --   --   --   ALBUMIN 2.7*  --   --   --   --   AST 24  --   --   --   --   ALT 22  --   --   --   --   ALKPHOS 77  --   --   --   --   BILITOT 0.8  --   --   --   --   GFRNONAA 46*   < > 54* 56* 58*  GFRAA 54*   < > >60 >60 >60  ANIONGAP 12   < > 11 11 8    < > =  values in this interval not displayed.     Hematology Recent Labs  Lab 08/09/18 1650 08/10/18 0303 08/11/18 0337  WBC 6.0 6.5 6.3  RBC 4.84 4.46 4.72  HGB 12.0 11.0* 12.0  HCT 39.2 36.1 37.7  MCV 81.0 80.9 79.9*  MCH 24.8* 24.7* 25.4*  MCHC 30.6 30.5 31.8  RDW 15.9* 15.9* 15.7*  PLT 307 312 299    Cardiac Enzymes Recent Labs  Lab 08/09/18 1650 08/10/18 0303  TROPONINI 0.14* 0.15*   No results for input(s): TROPIPOC in the last 168 hours.   BNP Recent Labs  Lab 08/09/18 1650 08/13/18 0358  BNP 1,335.3* 1,153.1*     DDimer  Recent Labs  Lab 08/09/18 1143  DDIMER 4.34*     Radiology    No results found.  Cardiac Studies   2D Echo 08/10/18 IMPRESSIONS    1. The left ventricle has severely reduced systolic function, with an ejection fraction of 20-25%. The cavity size was mildly dilated. There is mildly increased left ventricular wall thickness. Left ventricular diastolic Doppler parameters are  consistent with restrictive filling Left ventricular diffuse hypokinesis.  2. The right ventricle has moderately reduced systolic function. The cavity was mildly enlarged. There is no increase in right ventricular wall thickness. Right ventricular systolic pressure is severely elevated with an estimated pressure of 66.3 mmHg.  3. Right atrial pressure is estimated at 15 mmHg.  4. Left atrial size was  severely dilated.  5. Right atrial size was mildly dilated.  6. Trivial pericardial effusion.  7. The mitral valve is normal in structure. There is mild thickening.  8. The tricuspid valve is normal in structure. Tricuspid valve regurgitation is severe.  9. The aortic valve is tricuspid There is mild thickening of the aortic valve. 10. The pulmonic valve was normal in structure. Pulmonic valve regurgitation is mild by color flow Doppler. 11. The inferior vena cava was dilated in size with <50% respiratory variability. 12. Severe global reduction in LV systolic function; mild LVH; 4 chamber enlargement; restrictive filling; moderate RV dysfunction; mild MR; severe TR; severe pulmonary hypertension.   Patient Profile     Christine Cobb is a 43 y.o. AA female with a history of COPD, HTN, chronic diastolic HF, hyperthyroidism, bipolar disorder, schizophrenia, and polysubstance abuse.  She was admitted with hemoptysis and elevated d-dimer. Chest CT negative for PE but additional w/u revealed cardiomyopathy/ newly reduced EF at 20-25%.  Assessment & Plan    1. Cardiomyopathy:  newly reduced EF at 20-25%. She was seen initially by the AHF team, however given her social situation and psychatric issues she is not felt to be a candidate for any type of advanced therapies of HF interventions. Patient refused therapies  Cardiac catherization Was discussed to exclude underlying coronary ischemia as a potential etiology but pt declined. She denies CP. Plan for now is medical therapy.   She was initially treated w/ IV Lasix w/ improvement in volume status. Euvolemic on exam today and no dyspnea, orthopnea or PND. Also tachycardic. She may be a bit dry. Given volume status and soft BP, will continue to hold lasix. Monitor volume status. Low sodium diet. She has been started on an ARB, losartan, and  blocker, Coreg. She remains tachycardic on tele w/ rates in the 110s. No ventricular arrhthymias. TSH  normal. BPs soft in the low 90s. May need to reduce dose of Losartan to 12.5 mg, given soft BP. Continue current dose of Coreg or consider changing to metoprolol succinate  for better rate control. Typically we would recommend addition of Corlanor in the setting of reduced EF w/ persistent sinus tachycardia and inability to titrate  blocker due to soft BP, however cost may be an issue. Can check with case manager to do cost check.    For questions or updates, please contact [facility] Please consult [email or web address] for contact info under        Signed, Robbie Lis, PA-C  08/16/2018, 10:16 AM    Pt seen and examined   I agree with findings as noted above by B Simmons  Pt remains tachycardic  100s to 110s (ST) NEck:   JVP is normal Lungs are CTA Cardiac exam RRR     Ext are without edema  Check UA for S>G.   Check orthostatics  Volume status appears ok but she may be a little dry for degree of CHF) Would pull back on ARB and b blocker      Follow response If Specific Gravity elevated would hydrate   Pt refuses PICC line or any escalation of therapy  Dietrich Pates MD

## 2018-08-16 NOTE — Progress Notes (Signed)
Physical Therapy Treatment Patient Details Name: Christine Cobb MRN: [id number] DOB: 23-Jul-1975 Today's Date: 08/16/2018    History of Present Illness Pt is a 43 y.o. F with significant PMH of COPD, current smoker, HTN, bipolar, polysubstance abuse who presents with hemoptysis and positive d-dimer of 4.34. CT negative for PE. Being treated for acute on chronic hypoxic respiratory failure from CHF.    PT Comments    Pt reports being sleepy today from all the meds. She walked 200 feet in the hall - cues to go slowly.  Her HR 112 at rest to 130 with walking.  Educated on standing rest breaks etc.  Pt asymptomatic - except sleepy.  She will benefit from continued education from therapy - to increase her activity level - slowly and frequently.   Follow Up Recommendations  Home health PT;Supervision - Intermittent     Equipment Recommendations  3in1 (PT)    Recommendations for Other Services       Precautions / Restrictions Precautions Precaution Comments: watch HR Restrictions Weight Bearing Restrictions: No    Mobility  Bed Mobility Overal bed mobility: Independent                Transfers Overall transfer level: Needs assistance Equipment used: None Transfers: Sit to/from Stand Sit to Stand: Supervision            Ambulation/Gait Ambulation/Gait assistance: Supervision Gait Distance (Feet): 200 Feet Assistive device: None Gait Pattern/deviations: Step-through pattern;Decreased stride length;Narrow base of support     General Gait Details: No noted instability, modest decrease in cadence, elevation in HR to 130 (rest 112).  Pt educated on walking slowly and taking standing rest breaks.  pt educated on looking at cardiac monitor and educated on tachycardia.   Stairs             Wheelchair Mobility    Modified Rankin (Stroke Patients Only)       Balance Overall balance assessment: No apparent balance deficits (not formally assessed)                                           Cognition Arousal/Alertness: Awake/alert Behavior During Therapy: WFL for tasks assessed/performed Overall Cognitive Status: Within Functional Limits for tasks assessed                                 General Comments: pt sleepy - I had to wake her up - she says all the meds they are giving her make her sleep all day      Exercises      General Comments General comments (skin integrity, edema, etc.): focused education on mobilty modification - slow and frequent activity during the day.  Pt did well today. hse feels good - except sleepy      Pertinent Vitals/Pain Pain Assessment: No/denies pain    Home Living                      Prior Function            PT Goals (current goals can now be found in the care plan section) Progress towards PT goals: Progressing toward goals    Frequency    Min 3X/week      PT Plan Current plan remains appropriate    Co-evaluation  AM-PAC PT "6 Clicks" Mobility   Outcome Measure  Help needed turning from your back to your side while in a flat bed without using bedrails?: None Help needed moving from lying on your back to sitting on the side of a flat bed without using bedrails?: None Help needed moving to and from a bed to a chair (including a wheelchair)?: None Help needed standing up from a chair using your arms (e.g., wheelchair or bedside chair)?: A Little Help needed to walk in hospital room?: A Little Help needed climbing 3-5 steps with a railing? : A Lot 6 Click Score: 20    End of Session   Activity Tolerance: Patient tolerated treatment well Patient left: in bed;with call bell/phone within reach Nurse Communication: Mobility status PT Visit Diagnosis: Difficulty in walking, not elsewhere classified (R26.2)     Time: 1135-1150 PT Time Calculation (min) (ACUTE ONLY): 15 min  Charges:  $Gait Training: 8-22 mins                     08/16/2018   Ranae Palms, PT    Judson Roch 08/16/2018, 12:10 PM

## 2018-08-16 NOTE — Progress Notes (Signed)
Nutrition Follow-up  DOCUMENTATION CODES:   Non-severe (moderate) malnutrition in context of chronic illness  INTERVENTION:  Continue Ensure Enlive po BID, each supplement provides 350 kcal and 20 grams of protein.  Diet education regarding CHF nutrition therapy given.   Encourage adequate PO intake.  NUTRITION DIAGNOSIS:   Moderate Malnutrition related to chronic illness(COPD, chronic diastolic HF, polysubstance abuse) as evidenced by moderate fat depletion, moderate muscle depletion: ongoing  GOAL:   Patient will meet greater than or equal to 90% of their needs; met  MONITOR:   PO intake, Supplement acceptance, Labs, Skin, Weight trends, I & O's  REASON FOR ASSESSMENT:   Consult Assessment of nutrition requirement/status  ASSESSMENT:   43 y.o. Female with a history of COPD, HTN, chronic diastolic HF, bipolar disorder, schizophrenia, and polysubstance abuse; admitted with admitted with hemoptysis and elevated d-dimer; found to have newly reduced EF 20-25%.  Meal completion has been 100%. Pt reports appetite is fine. Pt currently has Ensure ordered and has been consuming them. RD to continue with current orders to aid in caloric and protein needs. RD consulted for diet education regarding low sodium, high calorie, high protein diet. Diet education given. Labs and medications reviewed.   Diet Order:   Diet Order            Diet regular Room service appropriate? Yes; Fluid consistency: Thin; Fluid restriction: 1500 mL Fluid  Diet effective now              EDUCATION NEEDS:   No education needs have been identified at this time  Skin:  Skin Assessment: Reviewed RN Assessment  Last BM:  2/18  Height:   Ht Readings from Last 1 Encounters:  08/10/18 5' 4"  (1.626 m)    Weight:   Wt Readings from Last 1 Encounters:  08/16/18 53.7 kg    Ideal Body Weight:  54.5 kg  BMI:  Body mass index is 20.32 kg/m.  Estimated Nutritional Needs:   Kcal:   1600-1800  Protein:  85-100 gm  Fluid:  1.5 L/day    Corrin Parker, MS, RD, LDN Pager # [phone] After hours/ weekend pager # [phone]

## 2018-08-16 NOTE — Care Management (Signed)
#    5.  S/W  NIKI  @ [employer] # [phone]   BRAND:  1. CORLANOR  5 MG BID COVER- YES CO-PAY- $3.90 TIER-4 DRUG PRIOR APPROVAL- YES # [phone]   GENERIC :  2. IVABRADINE  5 MG BID: NONE FORMULARY  PREFERRED PHARMACY : YES [facility]  AND    [facility]  M/O 90 DAY SUPPLY FOR M/O $ 3.90  MEDICAID OF [place] EFF-DATE : 06/28/2018 CO-PAY- $ 3.90 FOR EACH PRESCRIPTION

## 2018-08-16 NOTE — Progress Notes (Addendum)
PROGRESS NOTE  Attending MD note  Patient was seen, examined,treatment plan was discussed with the PA-S.  I have personally reviewed the clinical findings, lab, imaging studies and management of this patient in detail. I agree with the documentation, as recorded by the PA-S  Patient is 43 year old female with history of presumed COPD, hypertension, bipolar mood disorder/schizoaffective disorder, polysubstance abuse who was seen in pulmonary office on 2/12 with cough, hemoptysis and dyspnea and a positive d-dimer.  She was sent to the hospital, she was negative for PE.  She was found to have new onset acute systolic CHF  Constitutional: NAD, calm, comfortable Vitals:   08/16/18 0508 08/16/18 0749 08/16/18 1007 08/16/18 1159  BP:  94/65 93/70 96/70   Pulse:  (!) 102 (!) 110 (!) 113  Resp: (!) 23 18 20 19   Temp:  97.9 F (36.6 C)  98.5 F (36.9 C)  TempSrc:  Oral  Oral  SpO2:  100%  100%  Weight: 53.7 kg     Height:       Eyes: PERRL, lids and conjunctivae normal ENMT: Mucous membranes are moist.  Neck: normal, supple Respiratory: clear to auscultation bilaterally, no wheezing, no crackles. Cardiovascular: Regular rate and rhythm, no murmurs / rubs / gallops.  Abdomen: no tenderness  Musculoskeletal: no clubbing / cyanosis. Normal muscle tone.  Skin: no rashes Neurologic: no focal deficits   Plan  Acute hypoxic respiratory failure in the setting of new onset acute systolic CHF -Patient underwent a 2D echo which showed an EF of 20-25%, new -Cardiology consulted and are following, initially placed on IV diuresis but now this is on hold given patient looks euvolemic -Discussed today with the patient about low-sodium diet as well as daily weights at home -Continue Coreg, losartan  CKD stage III -Creatinine stable and at baseline  Troponin elevation -Likely demand ischemia  History of hypothyroidism -TSH borderline low at 0.49.  Will obtain a free T3 and T4 tomorrow  morning  Tobacco abuse, question COPD -Following pulmonology as an outpatient, she was counseled for tobacco cessation  Bipolar disorder/schizoaffective disorder -Continue Remeron and Abilify, has outpatient follow-up  Polysubstance abuse -UDS positive for cocaine, counseled for cessation  Scheduled Meds: . ARIPiprazole  10 mg Oral Daily  . carvedilol  6.25 mg Oral BID WC  . feeding supplement (ENSURE ENLIVE)  237 mL Oral BID BM  . guaiFENesin  1,200 mg Oral BID  . heparin  5,000 Units Subcutaneous Q8H  . losartan  25 mg Oral Daily  . mirtazapine  15 mg Oral QHS  . pantoprazole  20 mg Oral Daily  . sodium chloride flush  3 mL Intravenous Q12H   Continuous Infusions: PRN Meds:.guaiFENesin-codeine, hydrOXYzine, levalbuterol   Rest as below  Jenessa Gillingham M. Elvera Lennox, MD, PhD [facility]   Contact via  [email or web address]  [facility] Office Info P: [phone]  F: [phone]   Ashita Fleury OZD:[id number] DOB: 15-Dec-1975 DOA: 08/09/2018 PCP: Patient, No Pcp Per  HPI/Brief Narrative  Christine Cobb is a 43 y.o. year old female with medical history significant for COPD, hypertension, bipolar mood disorder/schizoaffective disorder, polysubstance abuse, smoker who presented presented to pulmonary office 08/09/2026 with cough, hemoptysis, and dyspnea, and positive d-dimer of 4.34. Patient was sent to the [facility] for direct admission under pulmonary critical care. She is being treated for acute on chronic hypoxic respiratory failure from CHF. CT was negative for PE.   Subjective No acute events overnight. Resting comfortably in bed. Notes improvement in lower extremity  edema.   Assessment/Plan:  New onset acute systolic CHF EF at 20-25%, decreased from normal EF on last echo in 2012. Echo also shows severe TR, severe pulmonary hypertension. Pt is also being followed by cardiology. Plan is medical therapy. Pt is euvolemic; will continue to hold lasix for now and  monitor volume status. Discussed importance of low sodium diet. Continue Losartan 25mg  qd, Coreg 6.25mg  bid.   Acute on chronic hypoxic respiratory failure in the setting of decompensated CHF. Respiratory status stable on room air.   Stage III CKD Cr stable at 1.17 (08/15/2018). Baseline Cr from 1-10/2017 ranged 1.1-1.4.   Positive troponin at 0.15 Likely from systolic dysfunction  History of hyperthyroidism TSH is stable at 0.49. Pt is not on thyroid replacement at home. Needs close f/u with PCP.   COPD, no current exacerbation  Continue bronchodilator therapy.  Current smoker Pt has not smoked in 1 week. Encouraged her to continue smoking cessation.   Elevated A1c Last A1c at 5.8 (08/09/2018). Pt refused CBG.   Bipolar disorder/schizoaffective disorder: continue Remeron and Abilify. She was admitted at [facility] 06/2017. Pt is following with outpatient psychiatry. Mood appears stable; seen by psych here and was continued on Abilify 10mg , Remeron 15mg  nightly. No need for current inpatient psych admission.  Polysubstance abuse: UDS positive for cocaine. Cessation counseling was provided. Social worker following. Social worker has provided mental health resources to patient.  Consultants: Cardiology  DVT prophylaxis: Heparin Code Status: fullCode Family Communication: none at bedside Disposition Plan: PT/OT recommends HHPT. No need for SNF. Disposition once okay with cardiology.  Procedures: 08/10/2018 TTE  1. The left ventricle has severely reduced systolic function, with an ejection fraction of 20-25%. The cavity size was mildly dilated. There is mildly increased left ventricular wall thickness. Left ventricular diastolic Doppler parameters are  consistent with restrictive filling Left ventricular diffuse hypokinesis. 2. The right ventricle has moderately reduced systolic function. The cavity was mildly enlarged. There is no increase in right ventricular wall thickness. Right  ventricular systolic pressure is severely elevated with an estimated pressure of 66.3 mmHg. 3. Right atrial pressure is estimated at 15 mmHg. 4. Left atrial size was severely dilated. 5. Right atrial size was mildly dilated. 6. Trivial pericardial effusion. 7. The mitral valve is normal in structure. There is mild thickening. 8. The tricuspid valve is normal in structure. Tricuspid valve regurgitation is severe. 9. The aortic valve is tricuspid There is mild thickening of the aortic valve. 10. The pulmonic valve was normal in structure. Pulmonic valve regurgitation is mild by color flow Doppler. 11. The inferior vena cava was dilated in size with <50% respiratory variability. 12. Severe global reduction in LV systolic function; mild LVH; 4 chamber enlargement; restrictive filling; moderate RV dysfunction; mild MR; severe TR; severe pulmonary hypertension.  Objective: Vitals:   08/16/18 0508 08/16/18 0749 08/16/18 1007 08/16/18 1159  BP:  94/65 93/70 96/70   Pulse:  (!) 102 (!) 110 (!) 113  Resp: (!) 23 18 20 19   Temp:  97.9 F (36.6 C)  98.5 F (36.9 C)  TempSrc:  Oral  Oral  SpO2:  100%  100%  Weight: 53.7 kg     Height:        Intake/Output Summary (Last 24 hours) at 08/16/2018 1231 Last data filed at 08/16/2018 1007 Gross per 24 hour  Intake 743 ml  Output 400 ml  Net 343 ml   Filed Weights   08/13/18 0523 08/15/18 0500 08/16/18 0508  Weight: 54 kg  53.9 kg 53.7 kg    Exam:  Constitutional: calm, comfortable. Appears older than age. ENMT: grossly WNL Cardiovascular: regular rhythm with tachy rate, no MRGs, S1 & S2 heard, no JVD/murmurs Respiratory: Normal respiratory effort, clear breath sounds, no wheezing/rhonchi Skin: No rash ulcers, or lesions Neurologic: Grossly no focal neuro deficit. Psychiatric:Appropriate affect, and mood. Mental status AAOx3  Data Reviewed: CBC: Recent Labs  Lab 08/09/18 1650 08/10/18 0303 08/11/18 0337  WBC 6.0 6.5 6.3   NEUTROABS 3.5  --   --   HGB 12.0 11.0* 12.0  HCT 39.2 36.1 37.7  MCV 81.0 80.9 79.9*  PLT 307 312 299   Basic Metabolic Panel: Recent Labs  Lab 08/09/18 1650 08/10/18 0303 08/11/18 0337 08/12/18 1013 08/13/18 0358 08/14/18 0700 08/15/18 0305  NA 137 138 136 136 137 137 135  K 4.1 4.7 4.1 4.4 4.0 4.3 4.1  CL 102 104 104 106 103 103 103  CO2 23 22 22 23 23 23 24   GLUCOSE 91 109* 86 106* 99 91 100*  BUN 18 18 20  24* 25* 20 25*  CREATININE 1.34* 1.41* 1.52* 1.26* 1.24* 1.21* 1.17*  CALCIUM 8.4* 8.4* 8.5* 8.7* 8.5* 8.5* 8.9  MG 2.0 1.9 1.7  --   --   --   --   PHOS  --  3.5 4.5  --   --   --   --    GFR: Estimated Creatinine Clearance: 54.2 mL/min (A) (by C-G formula based on SCr of 1.17 mg/dL (H)). Liver Function Tests: Recent Labs  Lab 08/10/18 0303  AST 24  ALT 22  ALKPHOS 77  BILITOT 0.8  PROT 6.0*  ALBUMIN 2.7*   No results for input(s): LIPASE, AMYLASE in the last 168 hours. No results for input(s): AMMONIA in the last 168 hours. Coagulation Profile: No results for input(s): INR, PROTIME in the last 168 hours. Cardiac Enzymes: Recent Labs  Lab 08/09/18 1650 08/10/18 0303  TROPONINI 0.14* 0.15*   BNP (last 3 results) No results for input(s): PROBNP in the last 8760 hours. HbA1C: No results for input(s): HGBA1C in the last 72 hours. CBG: Recent Labs  Lab 08/11/18 0837  GLUCAP 127*   Lipid Profile: No results for input(s): CHOL, HDL, LDLCALC, TRIG, CHOLHDL, LDLDIRECT in the last 72 hours. Thyroid Function Tests: No results for input(s): TSH, T4TOTAL, FREET4, T3FREE, THYROIDAB in the last 72 hours. Anemia Panel: No results for input(s): VITAMINB12, FOLATE, FERRITIN, TIBC, IRON, RETICCTPCT in the last 72 hours. Urine analysis:    Component Value Date/Time   COLORURINE YELLOW 04/28/2017 1015   APPEARANCEUR CLEAR 04/28/2017 1015   LABSPEC 1.008 04/28/2017 1015   PHURINE 6.0 04/28/2017 1015   GLUCOSEU NEGATIVE 04/28/2017 1015   HGBUR LARGE (A)  04/28/2017 1015   BILIRUBINUR NEGATIVE 04/28/2017 1015   KETONESUR NEGATIVE 04/28/2017 1015   PROTEINUR NEGATIVE 04/28/2017 1015   UROBILINOGEN 0.2 12/21/2014 0840   NITRITE NEGATIVE 04/28/2017 1015   LEUKOCYTESUR MODERATE (A) 04/28/2017 1015   Sepsis Labs: @LABRCNTIP (procalcitonin:4,lacticidven:4)  )No results found for this or any previous visit (from the past 240 hour(s)).    Studies: No results found.  Scheduled Meds: . ARIPiprazole  10 mg Oral Daily  . carvedilol  6.25 mg Oral BID WC  . feeding supplement (ENSURE ENLIVE)  237 mL Oral BID BM  . guaiFENesin  1,200 mg Oral BID  . heparin  5,000 Units Subcutaneous Q8H  . losartan  25 mg Oral Daily  . mirtazapine  15 mg Oral QHS  .  pantoprazole  20 mg Oral Daily  . sodium chloride flush  3 mL Intravenous Q12H    Continuous Infusions:   LOS: 7 days     Ignacia BayleyLaura Graham, PA-S 08/16/18

## 2018-08-16 NOTE — Telephone Encounter (Signed)
Still admitted

## 2018-08-16 NOTE — Plan of Care (Signed)
Nutrition Education Note  RD consulted for nutrition education regarding heart failure.  RD provided "Low Sodium Nutrition Therapy" handout from the Academy of [employer]. Reviewed patient's dietary recall. Provided examples on ways to decrease sodium intake in diet. Discouraged intake of processed foods and use of salt shaker. Encouraged fresh fruits and vegetables as well as whole grain sources of carbohydrates to maximize fiber intake.   RD discussed why it is important for patient to adhere to diet recommendations. Discussed high calorie, high protein nutrition therapy. List of foods recommended and not recommended were discussed. Teach back method used.  Expect good compliance.  Roslyn Smiling, MS, RD, LDN Pager # [phone] After hours/ weekend pager # [phone]

## 2018-08-17 LAB — BASIC METABOLIC PANEL
Anion gap: 7 (ref 5–15)
BUN: 20 mg/dL (ref 6–20)
CO2: 26 mmol/L (ref 22–32)
Calcium: 8.7 mg/dL — ABNORMAL LOW (ref 8.9–10.3)
Chloride: 103 mmol/L (ref 98–111)
Creatinine, Ser: 1.09 mg/dL — ABNORMAL HIGH (ref 0.44–1.00)
GFR calc Af Amer: >60 mL/min (ref >60)
GFR calc non Af Amer: >60 mL/min (ref >60)
Glucose, Bld: 88 mg/dL (ref 70–99)
POTASSIUM: 4.3 mmol/L (ref 3.5–5.1)
Sodium: 136 mmol/L (ref 135–145)

## 2018-08-17 LAB — CBC
HCT: 39.5 % (ref 36.0–46.0)
Hemoglobin: 11.9 g/dL — ABNORMAL LOW (ref 12.0–15.0)
MCH: 24.5 pg — ABNORMAL LOW (ref 26.0–34.0)
MCHC: 30.1 g/dL (ref 30.0–36.0)
MCV: 81.3 fL (ref 80.0–100.0)
Platelets: 329 10*3/uL (ref 150–400)
RBC: 4.86 MIL/uL (ref 3.87–5.11)
RDW: 15.7 % — ABNORMAL HIGH (ref 11.5–15.5)
WBC: 8 10*3/uL (ref 4.0–10.5)
nRBC: 0 % (ref 0.0–0.2)

## 2018-08-17 LAB — T4, FREE: FREE T4: 0.78 ng/dL — AB (ref 0.82–1.77)

## 2018-08-17 MED ORDER — DIGOXIN 125 MCG PO TABS: 0.1250 mg | ORAL_TABLET | Freq: Every day | ORAL | 0 refills | Status: DC

## 2018-08-17 MED ORDER — FUROSEMIDE 20 MG PO TABS: 20.0000 mg | ORAL_TABLET | Freq: Every day | ORAL | 1 refills | Status: DC | PRN

## 2018-08-17 NOTE — Progress Notes (Signed)
Telemetry discontinued. CCMD notified. Patient removed her own IV. States she was in a hurry. Discharge instructions reviewed with patient. Written RX's given to patient.

## 2018-08-17 NOTE — Discharge Instructions (Signed)
Follow with cardiology as scheduled  Please get a complete blood count and chemistry panel checked by your Primary MD at your next visit, and again as instructed by your Primary MD. Please get your medications reviewed and adjusted by your Primary MD.  Please request your Primary MD to go over all Hospital Tests and Procedure/Radiological results at the follow up, please get all Hospital records sent to your Prim MD by signing hospital release before you go home.  If you had Pneumonia of Lung problems at the Hospital: Please get a 2 view Chest X ray done in 6-8 weeks after hospital discharge or sooner if instructed by your Primary MD.  If you have Congestive Heart Failure: Please call your Cardiologist or Primary MD anytime you have any of the following symptoms:  1) 3 pound weight gain in 24 hours or 5 pounds in 1 week  2) shortness of breath, with or without a dry hacking cough  3) swelling in the hands, feet or stomach  4) if you have to sleep on extra pillows at night in order to breathe  Follow cardiac low salt diet and 1.5 lit/day fluid restriction.  If you have diabetes Accuchecks 4 times/day, Once in AM empty stomach and then before each meal. Log in all results and show them to your primary doctor at your next visit. If any glucose reading is under 80 or above 300 call your primary MD immediately.  If you have Seizure/Convulsions/Epilepsy: Please do not drive, operate heavy machinery, participate in activities at heights or participate in high speed sports until you have seen by Primary MD or a Neurologist and advised to do so again.  If you had Gastrointestinal Bleeding: Please ask your Primary MD to check a complete blood count within one week of discharge or at your next visit. Your endoscopic/colonoscopic biopsies that are pending at the time of discharge, will also need to followed by your Primary MD.  Get Medicines reviewed and adjusted. Please take all your medications  with you for your next visit with your Primary MD  Please request your Primary MD to go over all hospital tests and procedure/radiological results at the follow up, please ask your Primary MD to get all Hospital records sent to his/her office.  If you experience worsening of your admission symptoms, develop shortness of breath, life threatening emergency, suicidal or homicidal thoughts you must seek medical attention immediately by calling 911 or calling your MD immediately  if symptoms less severe.  You must read complete instructions/literature along with all the possible adverse reactions/side effects for all the Medicines you take and that have been prescribed to you. Take any new Medicines after you have completely understood and accpet all the possible adverse reactions/side effects.   Do not drive or operate heavy machinery when taking Pain medications.   Do not take more than prescribed Pain, Sleep and Anxiety Medications  Special Instructions: If you have smoked or chewed Tobacco  in the last 2 yrs please stop smoking, stop any regular Alcohol  and or any Recreational drug use.  Wear Seat belts while driving.  Please note You were cared for by a hospitalist during your hospital stay. If you have any questions about your discharge medications or the care you received while you were in the hospital after you are discharged, you can call the unit and asked to speak with the hospitalist on call if the hospitalist that took care of you is not available. Once you are discharged,  your primary care physician will handle any further medical issues. Please note that NO REFILLS for any discharge medications will be authorized once you are discharged, as it is imperative that you return to your primary care physician (or establish a relationship with a primary care physician if you do not have one) for your aftercare needs so that they can reassess your need for medications and monitor your lab  values.  You can reach the hospitalist office at phone [phone] or fax [phone]   If you do not have a primary care physician, you can call [phone] for a physician referral.  Activity: As tolerated with Full fall precautions use walker/cane & assistance as needed  Diet: low sodium  Disposition Home \

## 2018-08-17 NOTE — Telephone Encounter (Signed)
Patient still admitted at this time. 

## 2018-08-17 NOTE — Progress Notes (Signed)
CSW provided the patient with two bus passes.  Antony Blackbird, [facility] Clinical Social Worker [phone]

## 2018-08-17 NOTE — Progress Notes (Signed)
.   Progress Note  Patient Name: Christine Cobb Date of Encounter: 08/17/2018  Primary Cardiologist: New to [facility] (remotely seen by [facility] Cardiology in 2014)  Subjective   Breathing is OK in bed    Inpatient Medications    Scheduled Meds: . ARIPiprazole  10 mg Oral Daily  . digoxin  0.125 mg Oral Daily  . feeding supplement (ENSURE ENLIVE)  237 mL Oral BID BM  . guaiFENesin  1,200 mg Oral BID  . heparin  5,000 Units Subcutaneous Q8H  . mirtazapine  15 mg Oral QHS  . pantoprazole  20 mg Oral Daily  . sodium chloride flush  3 mL Intravenous Q12H   Continuous Infusions:  PRN Meds: guaiFENesin-codeine, hydrOXYzine, levalbuterol   Vital Signs    Vitals:   08/17/18 0755 08/17/18 0800 08/17/18 0900 08/17/18 0935  BP: 101/77 101/77    Pulse: 89 98  100  Resp: (!) 21 (!) 21 (!) 22   Temp: 97.9 F (36.6 C)     TempSrc: Oral     SpO2: 100% 100%    Weight:      Height:        Intake/Output Summary (Last 24 hours) at 08/17/2018 1027 Last data filed at 08/17/2018 0145 Gross per 24 hour  Intake 562.1 ml  Output 1000 ml  Net -437.9 ml   Last 3 Weights 08/17/2018 08/16/2018 08/15/2018  Weight (lbs) 118 lb 6.2 oz 118 lb 6.2 oz 118 lb 13.3 oz  Weight (kg) 53.7 kg 53.7 kg 53.9 kg      Telemetry    Sinus tach 100 to 100s  - Personally Reviewed  ECG      Physical Exam   GEN: thin appearing 43 y/o AAF in No acute distress.   Neck: No JVD Cardiac: regular rhythm w/ tachy rate Respiratory: Clear to auscultation bilaterally. GI: Soft, nontender, non-distended  MS: No edema; No deformity. Neuro:  Nonfocal  Psych: Normal affect   Labs    Chemistry Recent Labs  Lab 08/14/18 0700 08/15/18 0305 08/17/18 0250  NA 137 135 136  K 4.3 4.1 4.3  CL 103 103 103  CO2 23 24 26   GLUCOSE 91 100* 88  BUN 20 25* 20  CREATININE 1.21* 1.17* 1.09*  CALCIUM 8.5* 8.9 8.7*  GFRNONAA 56* 58* >60  GFRAA >60 >60 >60  ANIONGAP 11 8 7      Hematology Recent Labs  Lab  08/11/18 0337 08/17/18 0250  WBC 6.3 8.0  RBC 4.72 4.86  HGB 12.0 11.9*  HCT 37.7 39.5  MCV 79.9* 81.3  MCH 25.4* 24.5*  MCHC 31.8 30.1  RDW 15.7* 15.7*  PLT 299 329    Cardiac Enzymes No results for input(s): TROPONINI in the last 168 hours. No results for input(s): TROPIPOC in the last 168 hours.   BNP Recent Labs  Lab 08/13/18 0358  BNP 1,153.1*     DDimer  No results for input(s): DDIMER in the last 168 hours.   Radiology    No results found.  Cardiac Studies   2D Echo 08/10/18 IMPRESSIONS    1. The left ventricle has severely reduced systolic function, with an ejection fraction of 20-25%. The cavity size was mildly dilated. There is mildly increased left ventricular wall thickness. Left ventricular diastolic Doppler parameters are  consistent with restrictive filling Left ventricular diffuse hypokinesis.  2. The right ventricle has moderately reduced systolic function. The cavity was mildly enlarged. There is no increase in right ventricular wall thickness. Right  ventricular systolic pressure is severely elevated with an estimated pressure of 66.3 mmHg.  3. Right atrial pressure is estimated at 15 mmHg.  4. Left atrial size was severely dilated.  5. Right atrial size was mildly dilated.  6. Trivial pericardial effusion.  7. The mitral valve is normal in structure. There is mild thickening.  8. The tricuspid valve is normal in structure. Tricuspid valve regurgitation is severe.  9. The aortic valve is tricuspid There is mild thickening of the aortic valve. 10. The pulmonic valve was normal in structure. Pulmonic valve regurgitation is mild by color flow Doppler. 11. The inferior vena cava was dilated in size with <50% respiratory variability. 12. Severe global reduction in LV systolic function; mild LVH; 4 chamber enlargement; restrictive filling; moderate RV dysfunction; mild MR; severe TR; severe pulmonary hypertension.   Patient Profile     Christine Cobb is a 43 y.o. AA female AA female with a history of COPD, HTN, chronic diastolic HF, hyperthyroidism, bipolar disorder, schizophrenia, and polysubstance abuse.  She was admitted with hemoptysis and elevated d-dimer. Chest CT negative for PE but additional w/u revealed cardiomyopathy/ newly reduced EF at 20-25%.  Assessment & Plan    1. Cardiomyopathy:  newly reduced EF at 20-25%. She was seen initially by the AHF team, however given her social situation and psychatric issues she is not felt to be a candidate for any type of advanced therapies of HF interventions. Patient refused therapies  Cardiac catherization Was discussed to exclude underlying coronary ischemia as a potential etiology but pt declined. She denies CP. Plan for now is medical therapy.  Yesterday I stopped all meds as BP was in 80s    Gave her a little IV fluid  (orhtostatics yesterday  HR went up 20 pts with standing but so did BP ) Today laynig comfortablly  BP is better   I would not give further lasix    I am reluctant to give other meds with BP being so fragil   Ambulate and follow HR     I do not have much more to offer    Can make clnic appt for her to f/u in gen cardiology Again,pt does not want any other intervnetions done  If OK ambulating could consider d/c    For questions or updates, please contact [facility] Please consult [email or web address] for contact info under     Signed, Dietrich Pates, MD  08/17/2018, 10:27 AM

## 2018-08-17 NOTE — Discharge Summary (Signed)
Physician Discharge Summary  Christine Cobb GYB:[id number] DOB: Sep 18, 1975 DOA: 08/09/2018  PCP: Patient, No Pcp Per  Admit date: 08/09/2018 Discharge date: 08/17/2018  Admitted From: home Disposition:  home  Recommendations for Outpatient Follow-up:  1. Follow up with cardiology as scheduled  Home Health: PT, RN Equipment/Devices: none   Discharge Condition: stable CODE STATUS: Full code Diet recommendation: low sodium  HPI: Per admitting MD, 43 year old female current every day smoker with history of COPD, CHF, HTN, Hyperthyroid, and bipolar mood disorder, schizoid affective disorder with polysubstance abuse presented to the pulmonary office 08/09/2018 with cough hemoptysis, dyspnea and a d dimer of 4.34. She was sent to [facility] hospital via EMS for Direct admit by PCCM.She was originaly seen by Dr. Wynona Neat on 08/01/2018. He re-started her lasix and her nebulized Proventil. She returned to the office 08/09/2018 with worsening dyspnea, cough, hemoptysis. D dimer was elevated at 4.34. She endorses decreased mobility as she has not been feeling well. She denies any recent fever, but CXR indicated Airspace opacity in the right lower lobe with small pleural effusion. In the setting of rib pain and hemoptysis, pneumonia, lung infarct, or mass are considerations.She is a poor historian,due to extensive psychiatric diagnoses and was unable to give many details. She was staffed by Dr. Wynona Neat, who opted for admission . She will be pre admitted to 2W 01 with PCCM as Primary.  Hospital Course: Acute hypoxic respiratory failure in the setting of new onset acute systolic CHF -patient was admitted to the hospital with significant dyspnea in the setting of fluid overload due to new onset systolic CHF.  Cardiology was consulted and followed patient while hospitalized. Patient underwent a 2D echo which showed an EF of 20-25%.  She was initially placed on IV diuresis with improvement of her volume status.   She was initially placed on beta-blockers and losartan however due to hypotension these were discontinued and she was given digoxin due to slightly elevated heart rate.  She seems to be tolerating this well.  Of note, refused therapies recommended by cardiology such as cardiac catheterization to exclude coronary ischemia, and for now medical management is recommended, and not much to offer at this point.  She will be discharged home with close outpatient follow-up with cardiology. CKD stage III -Creatinine stable and at baseline Troponin elevation -Likely demand ischemia History of hypothyroidism -TSH borderline low at 0.49.  Free T4 actually slightly low as well, recommend repeat as an outpatient in 2 to 3 weeks Tobacco abuse, question COPD -Following pulmonology as an outpatient, she was counseled for tobacco cessation Bipolar disorder/schizoaffective disorder -Continue Remeron and Abilify, has outpatient follow-up Polysubstance abuse -UDS positive for cocaine, counseled for cessation   Discharge Diagnoses:  Principal Problem:   Schizoaffective disorder, bipolar type (HCC) Active Problems:   Hemoptysis   Respiratory failure (HCC)   Chest pain   Community acquired pneumonia of right lower lobe of lung (HCC)   Malnutrition of moderate degree     Discharge Instructions   Allergies as of 08/17/2018      Reactions   Amoxil [amoxicillin] Anaphylaxis   Hydroxyzine    Other reaction(s): Bleeding   Ketorolac Tromethamine Hives, Swelling   Prenatal [b-plex Plus] Nausea Only   Tegretol [carbamazepine] Other (See Comments)   Swelling, skin peeling, skin discoloration   B-plex Plus Nausea Only   Prenatal   Tegretol [carbamazepine] Swelling, Other (See Comments)   Skin peeling Skin discoloring       Medication List  STOP taking these medications   doxycycline 100 MG tablet Commonly known as:  VIBRA-TABS     TAKE these medications   albuterol (2.5 MG/3ML) 0.083% nebulizer  solution Commonly known as:  PROVENTIL Take 3 mLs (2.5 mg total) by nebulization every 6 (six) hours as needed for wheezing or shortness of breath.   albuterol 108 (90 Base) MCG/ACT inhaler Commonly known as:  PROVENTIL HFA;VENTOLIN HFA Inhale 2 puffs into the lungs every 4 (four) hours as needed for wheezing or shortness of breath.   amantadine 100 MG capsule Commonly known as:  SYMMETREL Take 1 capsule (100 mg total) 2 (two) times daily by mouth.   ARIPiprazole 10 MG tablet Commonly known as:  ABILIFY Take 1 tablet (10 mg total) daily by mouth.   digoxin 0.125 MG tablet Commonly known as:  LANOXIN Take 1 tablet (0.125 mg total) by mouth daily. Start taking on:  August 18, 2018   furosemide 20 MG tablet Commonly known as:  LASIX Take 1 tablet (20 mg total) by mouth daily as needed for fluid or edema. What changed:    when to take this  reasons to take this   mirtazapine 15 MG tablet Commonly known as:  REMERON Take 1 tablet (15 mg total) at bedtime by mouth.   nicotine polacrilex 2 MG gum Commonly known as:  NICORETTE Take 1 each (2 mg total) by mouth as needed for smoking cessation.      Follow-up Information    Tereso NewcomerWeaver, Scott T, PA-C Follow up.   Specialties:  Cardiology, Physician Assistant Why:  Cardiology hospital follow-up on 08/29/2018 at 815.  Please arrive 10 minutes early for check-in. Contact information: [address] [place] [address] [phone]        Primary care Follow up.   Contact information: Call for an apointment as soon as possilbe- if you need assistance in finding a new doctor- please call [facility]- [phone] for assistance          Consultations:  Cardiology  Procedures/Studies:  2D echo  IMPRESSIONS   1. The left ventricle has severely reduced systolic function, with an ejection fraction of 20-25%. The cavity size was mildly dilated. There is mildly increased left ventricular wall  thickness. Left ventricular diastolic Doppler parameters are  consistent with restrictive filling Left ventricular diffuse hypokinesis.  2. The right ventricle has moderately reduced systolic function. The cavity was mildly enlarged. There is no increase in right ventricular wall thickness. Right ventricular systolic pressure is severely elevated with an estimated pressure of 66.3 mmHg.  3. Right atrial pressure is estimated at 15 mmHg.  4. Left atrial size was severely dilated.  5. Right atrial size was mildly dilated.  6. Trivial pericardial effusion.  7. The mitral valve is normal in structure. There is mild thickening.  8. The tricuspid valve is normal in structure. Tricuspid valve regurgitation is severe.  9. The aortic valve is tricuspid There is mild thickening of the aortic valve. 10. The pulmonic valve was normal in structure. Pulmonic valve regurgitation is mild by color flow Doppler. 11. The inferior vena cava was dilated in size with <50% respiratory variability. 12. Severe global reduction in LV systolic function; mild LVH; 4 chamber enlargement; restrictive filling; moderate RV dysfunction; mild MR; severe TR; severe pulmonary hypertension.   Dg Chest 2 View  Result Date: 08/09/2018 CLINICAL DATA:  Coughing up blood for 1.5 months.  Right-sided pain. EXAM: CHEST - 2 VIEW COMPARISON:  None. FINDINGS: Airspace  disease at the right base with small pleural effusion. Borderline heart size. Clear left lung. No acute osseous finding. These results will be called to the ordering clinician or representative by the Radiologist Assistant, and communication documented in the PACS or zVision Dashboard. IMPRESSION: 1. Airspace opacity in the right lower lobe with small pleural effusion. In the setting of rib pain and hemoptysis, pneumonia, lung infarct, or mass are considerations. 2. Cardiomegaly without failure. Electronically Signed   By: Marnee Spring M.D.   On: 08/09/2018 12:06   Ct Angio  Chest Pe W Or Wo Contrast  Result Date: 08/10/2018 CLINICAL DATA:  Shortness of breath and chest pain. EXAM: CT ANGIOGRAPHY CHEST WITH CONTRAST TECHNIQUE: Multidetector CT imaging of the chest was performed using the standard protocol during bolus administration of intravenous contrast. Multiplanar CT image reconstructions and MIPs were obtained to evaluate the vascular anatomy. CONTRAST:  ISOVUE-370 IOPAMIDOL (ISOVUE-370) INJECTION 76% COMPARISON:  August 09, 2018 chest radiograph FINDINGS: Cardiovascular: There is no demonstrable pulmonary embolus. There is no appreciable thoracic aortic aneurysm. While no dissection is seen, the contrast bolus is not sufficient to assess for thoracic aortic dissection. Visualized great vessels appear unremarkable. There is no pericardial effusion or pericardial thickening evident. Heart remains mildly enlarged. Mediastinum/Nodes: Thyroid appears unremarkable. There is no appreciable thoracic adenopathy. No esophageal lesions are evident. Lungs/Pleura: There is a moderate free-flowing pleural effusion on the right. There is compressive atelectasis in the right lower lobe. There is also atelectasis in the left lower lobe without frank consolidation. There is mild generalized interstitial thickening, likely due to mild pulmonary edema. Upper Abdomen: There is reflux of contrast into the inferior vena cava and hepatic veins. Visualized upper abdominal structures otherwise appear unremarkable. Musculoskeletal: There is a degree of anasarca. There are no blastic or lytic bone lesions. No chest wall lesions are appreciable. Review of the MIP images confirms the above findings. IMPRESSION: 1. No demonstrable pulmonary embolus. No thoracic aortic aneurysm. No thoracic aortic dissection seen; it must be cautioned that the contrast bolus does not sufficiently opacified the aorta to assess meaningfully for potential dissection. 2. Moderate free-flowing pleural effusion. Mild  interstitial edema. Mild cardiomegaly. These findings are felt to represent a degree of congestive heart failure. 3. Compressive atelectasis right lower lobe. Patchy atelectasis left base. No frank airspace consolidation. 4. Reflux of contrast into the inferior vena cava and hepatic veins is felt to be indicative of a degree of increase in right heart pressure. 5.  No appreciable thoracic adenopathy. Electronically Signed   By: Bretta Bang III M.D.   On: 08/10/2018 08:56   Portable Chest 1 View  Result Date: 08/10/2018 CLINICAL DATA:  Respiratory failure EXAM: PORTABLE CHEST 1 VIEW COMPARISON:  August 09, 2018 FINDINGS: There is stable cardiomegaly. The pulmonary vascularity appears within normal limits. There is a slightly larger right pleural effusion with bibasilar atelectasis. No adenopathy. No bone lesions. IMPRESSION: Cardiomegaly. Right pleural effusion, slightly larger. Bibasilar atelectasis. Electronically Signed   By: Bretta Bang III M.D.   On: 08/10/2018 08:57     Subjective: - no chest pain, shortness of breath, no abdominal pain, nausea or vomiting.   Discharge Exam: Vitals:   08/17/18 1200 08/17/18 1300  BP:    Pulse:    Resp: (!) 22 (!) 21  Temp:    SpO2:      General: Pt is alert, awake, not in acute distress Cardiovascular: RRR, S1/S2 +, no rubs, no gallops Respiratory: CTA bilaterally, no wheezing, no  rhonchi Abdominal: Soft, NT, ND, bowel sounds + Extremities: no edema, no cyanosis    The results of significant diagnostics from this hospitalization (including imaging, microbiology, ancillary and laboratory) are listed below for reference.     Microbiology: No results found for this or any previous visit (from the past 240 hour(s)).   Labs: BNP (last 3 results) Recent Labs    08/09/18 1650 08/13/18 0358  BNP 1,335.3* 1,153.1*   Basic Metabolic Panel: Recent Labs  Lab 08/11/18 0337 08/12/18 1013 08/13/18 0358 08/14/18 0700 08/15/18 0305  08/17/18 0250  NA 136 136 137 137 135 136  K 4.1 4.4 4.0 4.3 4.1 4.3  CL 104 106 103 103 103 103  CO2 22 23 23 23 24 26   GLUCOSE 86 106* 99 91 100* 88  BUN 20 24* 25* 20 25* 20  CREATININE 1.52* 1.26* 1.24* 1.21* 1.17* 1.09*  CALCIUM 8.5* 8.7* 8.5* 8.5* 8.9 8.7*  MG 1.7  --   --   --   --   --   PHOS 4.5  --   --   --   --   --    Liver Function Tests: No results for input(s): AST, ALT, ALKPHOS, BILITOT, PROT, ALBUMIN in the last 168 hours. No results for input(s): LIPASE, AMYLASE in the last 168 hours. No results for input(s): AMMONIA in the last 168 hours. CBC: Recent Labs  Lab 08/11/18 0337 08/17/18 0250  WBC 6.3 8.0  HGB 12.0 11.9*  HCT 37.7 39.5  MCV 79.9* 81.3  PLT 299 329   Cardiac Enzymes: No results for input(s): CKTOTAL, CKMB, CKMBINDEX, TROPONINI in the last 168 hours. BNP: Invalid input(s): POCBNP CBG: Recent Labs  Lab 08/11/18 0837  GLUCAP 127*   D-Dimer No results for input(s): DDIMER in the last 72 hours. Hgb A1c No results for input(s): HGBA1C in the last 72 hours. Lipid Profile No results for input(s): CHOL, HDL, LDLCALC, TRIG, CHOLHDL, LDLDIRECT in the last 72 hours. Thyroid function studies No results for input(s): TSH, T4TOTAL, T3FREE, THYROIDAB in the last 72 hours.  Invalid input(s): FREET3 Anemia work up No results for input(s): VITAMINB12, FOLATE, FERRITIN, TIBC, IRON, RETICCTPCT in the last 72 hours. Urinalysis    Component Value Date/Time   COLORURINE YELLOW 04/28/2017 1015   APPEARANCEUR CLEAR 04/28/2017 1015   LABSPEC 1.008 04/28/2017 1015   PHURINE 6.0 04/28/2017 1015   GLUCOSEU NEGATIVE 04/28/2017 1015   HGBUR LARGE (A) 04/28/2017 1015   BILIRUBINUR NEGATIVE 04/28/2017 1015   KETONESUR NEGATIVE 04/28/2017 1015   PROTEINUR NEGATIVE 04/28/2017 1015   UROBILINOGEN 0.2 12/21/2014 0840   NITRITE NEGATIVE 04/28/2017 1015   LEUKOCYTESUR MODERATE (A) 04/28/2017 1015   Sepsis Labs Invalid input(s): PROCALCITONIN,  WBC,   LACTICIDVEN   Time coordinating discharge: 40 minutes  SIGNED:  [name], MD  [facility] 08/17/2018, 2:13 PM

## 2018-08-17 NOTE — Progress Notes (Signed)
Patient strongly encouraged to ambulate in the hallway. Patient declined.

## 2018-08-18 LAB — T3, FREE: T3 FREE: 2.2 pg/mL (ref 2.0–4.4)

## 2018-08-18 NOTE — Telephone Encounter (Signed)
Pt is no longer admitted.  LMTCB x1 for pt.

## 2018-08-21 NOTE — Telephone Encounter (Signed)
ATC pt, line rang busy x2. Will try back. 

## 2018-08-22 ENCOUNTER — Encounter (HOSPITAL_COMMUNITY): Payer: Self-pay | Admitting: *Deleted

## 2018-08-22 ENCOUNTER — Emergency Department (HOSPITAL_COMMUNITY)
Admission: EM | Admit: 2018-08-22 | Discharge: 2018-08-26 | Disposition: A | Discharge status: Other Acute Inpatient Hospital | Payer: Medicare HMO | Attending: Emergency Medicine | Admitting: Emergency Medicine

## 2018-08-22 DIAGNOSIS — I509 Heart failure, unspecified: Secondary | ICD-10-CM | POA: Diagnosis not present

## 2018-08-22 DIAGNOSIS — I110 Hypertensive heart disease with heart failure: Secondary | ICD-10-CM | POA: Diagnosis not present

## 2018-08-22 DIAGNOSIS — F23 Brief psychotic disorder: Secondary | ICD-10-CM | POA: Diagnosis not present

## 2018-08-22 DIAGNOSIS — F918 Other conduct disorders: Secondary | ICD-10-CM | POA: Diagnosis not present

## 2018-08-22 DIAGNOSIS — R462 Strange and inexplicable behavior: Secondary | ICD-10-CM | POA: Diagnosis present

## 2018-08-22 DIAGNOSIS — Z970 Presence of artificial eye: Secondary | ICD-10-CM | POA: Diagnosis not present

## 2018-08-22 DIAGNOSIS — F17210 Nicotine dependence, cigarettes, uncomplicated: Secondary | ICD-10-CM | POA: Insufficient documentation

## 2018-08-22 DIAGNOSIS — Z046 Encounter for general psychiatric examination, requested by authority: Secondary | ICD-10-CM | POA: Insufficient documentation

## 2018-08-22 DIAGNOSIS — R451 Restlessness and agitation: Secondary | ICD-10-CM | POA: Insufficient documentation

## 2018-08-22 DIAGNOSIS — Z79899 Other long term (current) drug therapy: Secondary | ICD-10-CM | POA: Insufficient documentation

## 2018-08-22 NOTE — ED Triage Notes (Signed)
Pt brought to ED via [employer] - IVC'd after police called to the home by pt's sister for odd behavior. When EMS arrived pt took 10 - 10mg  Lasix pills per [employer]. She then took her fake eyeball out and put it in her mouth - per [employer] did not swallow but threw the eye. Pt arrived in cuffs, singing loudly, and not making sense in conversation.

## 2018-08-22 NOTE — Telephone Encounter (Signed)
Called [phone], and a man answered, and stated that was not her number, and he did not know where she was at.   Called [phone], number listed as home number, left message for Patient to call back.

## 2018-08-23 DIAGNOSIS — F23 Brief psychotic disorder: Secondary | ICD-10-CM | POA: Diagnosis not present

## 2018-08-23 LAB — COMPREHENSIVE METABOLIC PANEL
ALT: 20 U/L (ref 0–44)
ANION GAP: 12 (ref 5–15)
AST: 30 U/L (ref 15–41)
Albumin: 3.7 g/dL (ref 3.5–5.0)
Alkaline Phosphatase: 89 U/L (ref 38–126)
BUN: 24 mg/dL — ABNORMAL HIGH (ref 6–20)
CO2: 22 mmol/L (ref 22–32)
Calcium: 9.4 mg/dL (ref 8.9–10.3)
Chloride: 101 mmol/L (ref 98–111)
Creatinine, Ser: 1.38 mg/dL — ABNORMAL HIGH (ref 0.44–1.00)
GFR calc non Af Amer: 48 mL/min — ABNORMAL LOW (ref >60)
GFR, EST AFRICAN AMERICAN: 55 mL/min — AB (ref >60)
Glucose, Bld: 119 mg/dL — ABNORMAL HIGH (ref 70–99)
Potassium: 3.9 mmol/L (ref 3.5–5.1)
Sodium: 135 mmol/L (ref 135–145)
Total Bilirubin: 1.2 mg/dL (ref 0.3–1.2)
Total Protein: 8.5 g/dL — ABNORMAL HIGH (ref 6.5–8.1)

## 2018-08-23 LAB — CBC
HCT: 40.5 % (ref 36.0–46.0)
Hemoglobin: 12.2 g/dL (ref 12.0–15.0)
MCH: 24.2 pg — ABNORMAL LOW (ref 26.0–34.0)
MCHC: 30.1 g/dL (ref 30.0–36.0)
MCV: 80.2 fL (ref 80.0–100.0)
Platelets: 436 10*3/uL — ABNORMAL HIGH (ref 150–400)
RBC: 5.05 MIL/uL (ref 3.87–5.11)
RDW: 16.3 % — ABNORMAL HIGH (ref 11.5–15.5)
WBC: 13 10*3/uL — AB (ref 4.0–10.5)
nRBC: 0 % (ref 0.0–0.2)

## 2018-08-23 LAB — ACETAMINOPHEN LEVEL

## 2018-08-23 LAB — I-STAT BETA HCG BLOOD, ED (MC, WL, AP ONLY): I-stat hCG, quantitative: <5 m[IU]/mL (ref <5)

## 2018-08-23 LAB — CBG MONITORING, ED: Glucose-Capillary: 75 mg/dL (ref 70–99)

## 2018-08-23 LAB — RAPID URINE DRUG SCREEN, HOSP PERFORMED
Amphetamines: NOT DETECTED
Barbiturates: NOT DETECTED
Benzodiazepines: NOT DETECTED
COCAINE: POSITIVE — AB
Opiates: NOT DETECTED
Tetrahydrocannabinol: NOT DETECTED

## 2018-08-23 LAB — SALICYLATE LEVEL: Salicylate Lvl: <7 mg/dL (ref 2.8–30.0)

## 2018-08-23 LAB — ETHANOL: Alcohol, Ethyl (B): <10 mg/dL (ref <10)

## 2018-08-23 MED ORDER — DIPHENHYDRAMINE HCL 50 MG/ML IJ SOLN
50.0000 mg | Freq: Once | INTRAMUSCULAR | Status: AC
  Administered 2018-08-23: 50 mg via INTRAMUSCULAR
  Filled 2018-08-23: qty 1

## 2018-08-23 MED ORDER — LORAZEPAM 1 MG PO TABS: 2.0000 mg | ORAL_TABLET | Freq: Once | ORAL | Status: DC

## 2018-08-23 MED ORDER — OLANZAPINE 10 MG IM SOLR
10.0000 mg | Freq: Once | INTRAMUSCULAR | Status: AC
  Administered 2018-08-23: 10 mg via INTRAMUSCULAR
  Filled 2018-08-23: qty 10

## 2018-08-23 MED ORDER — STERILE WATER FOR INJECTION IJ SOLN
INTRAMUSCULAR | Status: AC
  Administered 2018-08-23: 01:00:00
  Filled 2018-08-23: qty 10

## 2018-08-23 MED ORDER — ZIPRASIDONE MESYLATE 20 MG IM SOLR
10.0000 mg | Freq: Once | INTRAMUSCULAR | Status: AC
  Administered 2018-08-23: 10 mg via INTRAMUSCULAR

## 2018-08-23 MED ORDER — ZIPRASIDONE MESYLATE 20 MG IM SOLR
20.0000 mg | Freq: Once | INTRAMUSCULAR | Status: DC | PRN
  Filled 2018-08-23: qty 20

## 2018-08-23 MED ORDER — LORAZEPAM 2 MG/ML IJ SOLN
2.0000 mg | Freq: Once | INTRAMUSCULAR | Status: DC
  Filled 2018-08-23: qty 1

## 2018-08-23 MED ORDER — ZIPRASIDONE MESYLATE 20 MG IM SOLR
20.0000 mg | Freq: Once | INTRAMUSCULAR | Status: AC | PRN
  Administered 2018-08-23: 20 mg via INTRAMUSCULAR
  Filled 2018-08-23: qty 20

## 2018-08-23 MED ORDER — DIPHENHYDRAMINE HCL 25 MG PO CAPS: 50.0000 mg | ORAL_CAPSULE | Freq: Once | ORAL | Status: DC

## 2018-08-23 MED ORDER — ZIPRASIDONE MESYLATE 20 MG IM SOLR
10.0000 mg | Freq: Once | INTRAMUSCULAR | Status: AC
  Administered 2018-08-23: 10 mg via INTRAMUSCULAR
  Filled 2018-08-23: qty 20

## 2018-08-23 MED ORDER — LORAZEPAM 2 MG/ML IJ SOLN
2.0000 mg | INTRAMUSCULAR | Status: DC | PRN
  Administered 2018-08-24 – 2018-08-26 (×4): 2 mg via INTRAMUSCULAR
  Filled 2018-08-23 (×4): qty 1

## 2018-08-23 MED ORDER — STERILE WATER FOR INJECTION IJ SOLN
INTRAMUSCULAR | Status: AC
  Administered 2018-08-23: 06:00:00
  Filled 2018-08-23: qty 10

## 2018-08-23 MED ORDER — LORAZEPAM 2 MG/ML IJ SOLN
2.0000 mg | Freq: Once | INTRAMUSCULAR | Status: AC
  Administered 2018-08-23: 2 mg via INTRAMUSCULAR
  Filled 2018-08-23: qty 1

## 2018-08-23 MED ORDER — STERILE WATER FOR INJECTION IJ SOLN
INTRAMUSCULAR | Status: AC
  Filled 2018-08-23: qty 10

## 2018-08-23 NOTE — ED Provider Notes (Signed)
[facility] EMERGENCY DEPARTMENT Provider Note   CSN: [id number] Arrival date & time: 08/22/18  2305    History   Chief Complaint Chief Complaint  Patient presents with  . Medical Clearance    HPI Christine Cobb is a 43 y.o. female.     Patient presents to the emergency department under IVC for psychotic behavior.  She has a history of bipolar affective disorder, schizophrenia, polysubstance abuse, and CHF.  She was recently admitted for CHF exacerbation.  See for strange behavior.  She took ten 10 mg tablets of her Lasix, took out her defaults eye and put it in her mouth, and has been singing at the top of her lungs.  Level 5 caveat applies 2/2 psychiatric condition and aggressive behavior.  The history is provided by the patient. No language interpreter was used.    Past Medical History:  Diagnosis Date  . Arm pain   . Bipolar affective disorder, currently manic, mild (HCC)   . CHF (congestive heart failure) (HCC)   . Eye globe prosthesis   . HTN (hypertension)   . Hyperthyroidism   . Sinus tachycardia     Patient Active Problem List   Diagnosis Date Noted  . Malnutrition of moderate degree 08/11/2018  . Hemoptysis 08/09/2018  . Respiratory failure (HCC)   . Chest pain   . Community acquired pneumonia of right lower lobe of lung (HCC)   . Bipolar affective disorder, manic, severe (HCC) 04/30/2017  . Severe bipolar affective disorder with psychosis (HCC) 04/29/2017  . Cocaine abuse with cocaine-induced mood disorder (HCC) 06/02/2016  . Schizoaffective disorder, bipolar type (HCC)   . Bipolar disorder, current episode manic without psychotic features, severe (HCC)   . Agitation   . Manic behavior (HCC)   . Hyperthyroidism 09/21/2013  . Marijuana abuse 04/12/2013  . History of CHF (congestive heart failure) 02/01/2013  . Abscess of abdominal wall 10/18/2012  . HTN (hypertension) 04/08/2011  . Tachycardia 04/08/2011    Past Surgical  History:  Procedure Laterality Date  . DILATION AND CURETTAGE OF UTERUS    . EYE SURGERY       OB History    Gravida  3   Para  2   Term  2   Preterm      AB  1   Living  2     SAB  1   TAB  0   Ectopic      Multiple      Live Births  2            Home Medications    Prior to Admission medications   Medication Sig Start Date End Date Taking? Authorizing Provider  albuterol (PROVENTIL HFA;VENTOLIN HFA) 108 (90 Base) MCG/ACT inhaler Inhale 2 puffs into the lungs every 4 (four) hours as needed for wheezing or shortness of breath. 08/09/18   Ivonne Andrew, NP  albuterol (PROVENTIL) (2.5 MG/3ML) 0.083% nebulizer solution Take 3 mLs (2.5 mg total) by nebulization every 6 (six) hours as needed for wheezing or shortness of breath. 08/01/18   Tomma Lightning, MD  amantadine (SYMMETREL) 100 MG capsule Take 1 capsule (100 mg total) 2 (two) times daily by mouth. 05/09/17   Oneta Rack, NP  ARIPiprazole (ABILIFY) 10 MG tablet Take 1 tablet (10 mg total) daily by mouth. 05/10/17   Oneta Rack, NP  digoxin (LANOXIN) 0.125 MG tablet Take 1 tablet (0.125 mg total) by mouth daily. 08/18/18   Gherghe,  Daylene Katayama, MD  furosemide (LASIX) 20 MG tablet Take 1 tablet (20 mg total) by mouth daily as needed for fluid or edema. 08/17/18   Leatha Gilding, MD  mirtazapine (REMERON) 15 MG tablet Take 1 tablet (15 mg total) at bedtime by mouth. 05/09/17   Oneta Rack, NP  nicotine polacrilex (NICORETTE) 2 MG gum Take 1 each (2 mg total) by mouth as needed for smoking cessation. Patient not taking: Reported on 08/09/2018 08/09/18   Tomma Lightning, MD    Family History Family History  Problem Relation Age of Onset  . Hypertension Other   . Emphysema Other   . Asthma Son   . Diabetes Maternal Uncle   . Diabetes Paternal Grandmother     Social History Social History   Tobacco Use  . Smoking status: Current Every Day Smoker    Types: Cigarettes    Last attempt to quit:  08/21/2013    Years since quitting: 5.0  . Smokeless tobacco: Never Used  Substance Use Topics  . Alcohol use: Yes    Comment: Last drink: 1/2 beer PTA  . Drug use: Yes    Types: Marijuana    Comment: Pt sts "anything and everything"     Allergies   Amoxil [amoxicillin]; Hydroxyzine; Ketorolac tromethamine; Prenatal [b-plex plus]; Tegretol [carbamazepine]; B-plex plus; and Tegretol [carbamazepine]   Review of Systems Review of Systems  Unable to perform ROS: Psychiatric disorder     Physical Exam Updated Vital Signs BP (!) 109/98 (BP Location: Right Arm)   Pulse (!) 140   Temp 98.7 F (37.1 C) (Oral)   Resp 18   Ht 5\' 4"  (1.626 m)   Wt 56.7 kg   SpO2 100%   BMI 21.46 kg/m   Physical Exam Vitals signs and nursing note reviewed.  Constitutional:      General: She is not in acute distress.    Appearance: She is not diaphoretic.  HENT:     Head: Normocephalic and atraumatic.  Eyes:     Conjunctiva/sclera: Conjunctivae normal.     Pupils: Pupils are equal, round, and reactive to light.     Comments: Left eyes is false  Neck:     Trachea: No tracheal deviation.  Pulmonary:     Effort: Pulmonary effort is normal. No respiratory distress.  Abdominal:     Palpations: Abdomen is soft.  Musculoskeletal: Normal range of motion.  Skin:    General: Skin is warm and dry.  Neurological:     Mental Status: She is alert and oriented to person, place, and time.     Comments: Ambulating  Psychiatric:        Judgment: Judgment normal.      ED Treatments / Results  Labs (all labs ordered are listed, but only abnormal results are displayed) Labs Reviewed  CBC - Abnormal; Notable for the following components:      Result Value   WBC 13.0 (*)    MCH 24.2 (*)    RDW 16.3 (*)    Platelets 436 (*)    All other components within normal limits  COMPREHENSIVE METABOLIC PANEL  ETHANOL  SALICYLATE LEVEL  ACETAMINOPHEN LEVEL  RAPID URINE DRUG SCREEN, HOSP PERFORMED    I-STAT BETA HCG BLOOD, ED ([facility], [facility], [facility] ONLY)  CBG MONITORING, ED    EKG None  Radiology No results found.  Procedures Procedures (including critical care time)  Medications Ordered in ED Medications - No data to display   Initial Impression /  Assessment and Plan / ED Course  I have reviewed the triage vital signs and the nursing notes.  Pertinent labs & imaging results that were available during my care of the patient were reviewed by me and considered in my medical decision making (see chart for details).        Patient with aggressive and agitated behavior.  Have attempted to calm patient verbally.  She may require chemical restraints in order to adequately examine the patient and to keep staff safe.  Will continue to attempt to calm her verbally.  Will consult TTS.  5:48 AM Patient reawakens, super agitated and combative.  Security called to bedside to restrain patient while 10mg  of Geodon given IM.  Patient still singing and acting bizarre, but currently cooperating.  Final Clinical Impressions(s) / ED Diagnoses   Final diagnoses:  Psychotic episode [facility])    ED Discharge Orders    None       Roxy Horseman, PA-C 08/23/18 0223    Zadie Rhine, MD 08/23/18 [phone]

## 2018-08-23 NOTE — ED Provider Notes (Signed)
Care assumed from Dr. Bebe Shaggy and PA Dahlia Client, please see their note for full details, but in brief Christine Cobb is a 43 y.o. female who presents under IVC for psychotic behavior.  Known history of similar episodes with diagnoses of bipolar affective disorder, schizophrenia and polysubstance abuse.  She was recently admitted for a CHF exacerbation.  Patient has been medically cleared for psychiatric evaluation, received Geodon and Ativan overnight, and continues to have some intermittent episodes of agitation.  No QTc prolongation on EKG, patient may continue to need intermittent medications for agitation while awaiting TTS consultation.  Physical Exam  BP 93/62   Pulse (!) 101   Temp 98.7 F (37.1 C) (Oral)   Resp 10   Ht 5\' 4"  (1.626 m)   Wt 56.7 kg   SpO2 96%   BMI 21.46 kg/m   Physical Exam Vitals signs and nursing note reviewed.  Constitutional:      General: She is not in acute distress.    Appearance: She is well-developed. She is not diaphoretic.     Comments: Patient is alert and oriented, on my evaluation she is resting comfortably, but intermittently becomes agitated, yelling at staff and coming into the hallway.  HENT:     Head: Normocephalic and atraumatic.  Eyes:     General:        Right eye: No discharge.        Left eye: No discharge.  Pulmonary:     Effort: Pulmonary effort is normal. No respiratory distress.  Neurological:     Mental Status: She is alert.     Coordination: Coordination normal.  Psychiatric:        Mood and Affect: Affect is labile.        Speech: Speech is rapid and pressured (Speech is loud and rapid).        Behavior: Behavior is agitated and aggressive.     ED Course/Procedures    Labs Reviewed  COMPREHENSIVE METABOLIC PANEL - Abnormal; Notable for the following components:      Result Value   Glucose, Bld 119 (*)    BUN 24 (*)    Creatinine, Ser 1.38 (*)    Total Protein 8.5 (*)    GFR calc non Af Amer 48 (*)    GFR  calc Af Amer 55 (*)    All other components within normal limits  ACETAMINOPHEN LEVEL - Abnormal; Notable for the following components:   Acetaminophen (Tylenol), Serum <10 (*)    All other components within normal limits  CBC - Abnormal; Notable for the following components:   WBC 13.0 (*)    MCH 24.2 (*)    RDW 16.3 (*)    Platelets 436 (*)    All other components within normal limits  RAPID URINE DRUG SCREEN, HOSP PERFORMED - Abnormal; Notable for the following components:   Cocaine POSITIVE (*)    All other components within normal limits  ETHANOL  SALICYLATE LEVEL  CBG MONITORING, ED  I-STAT BETA HCG BLOOD, ED ([facility], [facility], [facility] ONLY)     Procedures  MDM   Patient had 2 episodes of agitation during my care requiring medication, tried 10 of IM Zyprexa which calm patient down for a few hours and she became agitated again, she was given Geodon.  Patient has been in violent restraints as needed throughout the day as she has been aggressive towards staff.  TTS is seen and evaluated the patient, I discussed with Christine Churches, LCSW and he  is attempting to get in contact with psychiatric providers for appropriate disposition.  At shift change care signed out to PA Christine Cobb who will continue to monitor patient and provide medication as needed for agitation awaiting psych's input for appropriate disposition.  Given patient's continued psychotic symptoms she clearly cannot go home.       Dartha Lodge, PA-C 08/23/18 1632    Sabas Sous, MD 08/24/18 [phone]

## 2018-08-23 NOTE — BH Assessment (Signed)
Attempted to contact the pt's sister, but the pt is unable to provide her sister's number.  According to notes today and yesterday, [facility] have called numbers without success.

## 2018-08-23 NOTE — ED Notes (Signed)
Anna(SR)-Lunch Tray Ordered @ 1027-per RN-called by Perris Tripathi  

## 2018-08-23 NOTE — ED Provider Notes (Signed)
I was called to patient bedside as Pt becoming more agitated, she is now acting out and trying to harm staff.  She is becoming more aggressive towards staff and others, geodon/ativan given.   Restraints have been ordered for safety. D/w Jodi Geralds PA-C who will oversee patient while in the ED  CRITICAL CARE Performed by: Joya Gaskins Total critical care time: 30 minutes Critical care time was exclusive of separately billable procedures and treating other patients. Critical care was necessary to treat or prevent imminent or life-threatening deterioration. Critical care was time spent personally by me on the following activities: development of treatment plan with patient and/or surrogate as well as nursing, discussions with consultants, evaluation of patient's response to treatment, examination of patient, obtaining history from patient or surrogate, ordering and performing treatments and interventions, ordering and review of laboratory studies, ordering and review of radiographic studies, pulse oximetry and re-evaluation of patient's condition.    Zadie Rhine, MD 08/23/18 0700

## 2018-08-23 NOTE — ED Notes (Signed)
Pt became very agitated, urinating all over herself and walking the halls yelling and screaming.  Pt defecated on the floor and has soaked her clothing in urine.  RN encouraged pt to sit down in chair.  She attempted to fall face forward onto the floor however RN prevented her from falling out of the chair.  RN was assisted by another Charity fundraiser, room was cleaned, pt's clothing were changed.  Pt was assisted to the bed and given 10mg  of Geodon.

## 2018-08-23 NOTE — Consult Note (Signed)
  Medication recommendation  TTS Christine Cobb) reports that EDP was requesting medication recommendations.    Patient Home medications are Abilify 10 daily and Remeron 15 mg Q hs  Recommendation: Restart home medications; for agitation Geodon 10 mg/Ativan 2 mg IM Bid prn agitation; EKG to rule out QT prolongation ( other abnormality)   Disposition: inpatient psychiatric treatment once medically cleared   Spoke with Crystal, RN (patients nurse) informed of above recommendations and disposition.  States she will inform Dr. [name].

## 2018-08-23 NOTE — ED Provider Notes (Signed)
Received signout from [place].  Pt is having fulminant psychosis requiring interval antipsychosis medication.  Pt still experience agitation.  I have ordered benadryl 50mg  and ativan 2mg .  EKG without prolonged QT.  If no improvement, will consider Droperidol.  Pt will need to be transfer to [facility] for further psychiatric management.       [name], PA-C 08/24/18 1504    Vanetta Mulders, MD 08/24/18 [phone]

## 2018-08-23 NOTE — ED Notes (Signed)
Pt out of restraints going with police sitter and security to the br.  Food given as requested

## 2018-08-23 NOTE — ED Notes (Signed)
Patient is resting comfortably. Respirations even/unlabored. Sitter at the bedside.

## 2018-08-23 NOTE — ED Notes (Addendum)
Zyprexa 10mg  IM given in left deltoid. Pt is yelling and telling staff to leave room, will not stay in room.

## 2018-08-23 NOTE — ED Notes (Signed)
Breakfast Tray Ordered. 

## 2018-08-23 NOTE — Progress Notes (Signed)
Pt meets inpatient criteria per Assunta Found, NP. Referral information has been sent to the following hospitals for review:   [facility]  [facility]  [facility]  [facility]  [facility]  [facility]   Disposition will continue to assist with inpatient placement needs.   Wells Guiles, LCSW, LCAS Disposition CSW [facility]/TTS [phone]

## 2018-08-23 NOTE — Telephone Encounter (Signed)
We have attempted to contact pt several times with no success or call back from pt. Per triage protocol, message will be closed.  

## 2018-08-23 NOTE — BHH Counselor (Signed)
Attempted assessment.  Pt could not be roused.  Asked attending nurse to contact author/TTS when ready to be assessed.  Charna Busman at [phone].

## 2018-08-23 NOTE — ED Notes (Signed)
Paged Vascular 
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
## 2018-08-23 NOTE — ED Provider Notes (Signed)
Patient seen/examined in the Emergency Department in conjunction with Advanced Practice Provider [facility] Patient presents with odd behavior with h/o bipolar Exam : pt resting comfortably on my exam after receiving geodon Plan: awaiting psych recommendations BP (!) 109/98 (BP Location: Right Arm)   Pulse (!) 109   Temp 98.7 F (37.1 C) (Oral)   Resp 10   Ht 1.626 m (5\' 4" )   Wt 56.7 kg   SpO2 100%   BMI 21.46 kg/m     Zadie Rhine, MD 08/23/18 [phone]

## 2018-08-24 ENCOUNTER — Encounter (HOSPITAL_COMMUNITY): Payer: Self-pay | Admitting: Registered Nurse

## 2018-08-24 DIAGNOSIS — F23 Brief psychotic disorder: Secondary | ICD-10-CM | POA: Diagnosis not present

## 2018-08-24 MED ORDER — ARIPIPRAZOLE 10 MG PO TABS
10.0000 mg | ORAL_TABLET | Freq: Every day | ORAL | Status: DC
  Administered 2018-08-24 – 2018-08-25 (×2): 10 mg via ORAL
  Filled 2018-08-24 (×3): qty 1

## 2018-08-24 MED ORDER — ENSURE ENLIVE PO LIQD
237.0000 mL | Freq: Two times a day (BID) | ORAL | Status: DC
  Administered 2018-08-24: 237 mL via ORAL
  Filled 2018-08-24 (×2): qty 237

## 2018-08-24 MED ORDER — STERILE WATER FOR INJECTION IJ SOLN
INTRAMUSCULAR | Status: AC
  Filled 2018-08-24: qty 10

## 2018-08-24 MED ORDER — OLANZAPINE 10 MG IM SOLR
10.0000 mg | Freq: Once | INTRAMUSCULAR | Status: AC
  Administered 2018-08-24: 10 mg via INTRAMUSCULAR
  Filled 2018-08-24: qty 10

## 2018-08-24 MED ORDER — ZIPRASIDONE MESYLATE 20 MG IM SOLR
10.0000 mg | Freq: Once | INTRAMUSCULAR | Status: AC
  Administered 2018-08-24: 10 mg via INTRAMUSCULAR
  Filled 2018-08-24: qty 20

## 2018-08-24 MED ORDER — HALOPERIDOL LACTATE 5 MG/ML IJ SOLN
10.0000 mg | Freq: Four times a day (QID) | INTRAMUSCULAR | Status: DC | PRN
  Administered 2018-08-25 (×3): 10 mg via INTRAMUSCULAR
  Administered 2018-08-26 (×2): 5 mg via INTRAMUSCULAR
  Filled 2018-08-24 (×7): qty 2

## 2018-08-24 MED ORDER — ENSURE ENLIVE PO LIQD
237.0000 mL | Freq: Three times a day (TID) | ORAL | Status: DC
  Administered 2018-08-24 – 2018-08-26 (×7): 237 mL via ORAL
  Filled 2018-08-24 (×7): qty 237

## 2018-08-24 MED ORDER — STERILE WATER FOR INJECTION IJ SOLN
INTRAMUSCULAR | Status: AC
  Administered 2018-08-24: 2.1 mL
  Filled 2018-08-24: qty 10

## 2018-08-24 MED ORDER — HALOPERIDOL LACTATE 5 MG/ML IJ SOLN
2.0000 mg | Freq: Once | INTRAMUSCULAR | Status: AC
  Administered 2018-08-24: 2 mg via INTRAMUSCULAR
  Filled 2018-08-24: qty 1

## 2018-08-24 MED ORDER — HALOPERIDOL LACTATE 5 MG/ML IJ SOLN: 10.0000 mg | Freq: Four times a day (QID) | INTRAMUSCULAR | Status: DC | PRN

## 2018-08-24 MED ORDER — MIRTAZAPINE 15 MG PO TABS
15.0000 mg | ORAL_TABLET | Freq: Every day | ORAL | Status: DC
  Administered 2018-08-25 (×2): 15 mg via ORAL
  Filled 2018-08-24 (×2): qty 1

## 2018-08-24 MED ORDER — DIPHENHYDRAMINE HCL 50 MG/ML IJ SOLN: 50.0000 mg | Freq: Four times a day (QID) | INTRAMUSCULAR | Status: DC | PRN

## 2018-08-24 MED ORDER — ENSURE ENLIVE PO LIQD: 237.0000 mL | Freq: Three times a day (TID) | ORAL | Status: DC

## 2018-08-24 MED ORDER — DIPHENHYDRAMINE HCL 50 MG/ML IJ SOLN
50.0000 mg | Freq: Four times a day (QID) | INTRAMUSCULAR | Status: DC | PRN
  Administered 2018-08-25 – 2018-08-26 (×4): 50 mg via INTRAMUSCULAR
  Filled 2018-08-24 (×4): qty 1

## 2018-08-24 NOTE — ED Provider Notes (Addendum)
Emergency Medicine Observation Re-evaluation Note  Christine Cobb is a 43 y.o. female, seen on rounds today.  Pt initially presented to the ED for complaints of Medical Clearance Currently, the patient is eating when seen by me this morning.  Physical Exam  BP (!) 112/94 (BP Location: Left Arm)   Pulse (!) 126   Temp 98 F (36.7 C) (Oral)   Resp 18   Ht 5\' 4"  (1.626 m)   Wt 56.7 kg   SpO2 99%   BMI 21.46 kg/m  Physical Exam Constitutional:      Appearance: Normal appearance.     Comments: sleeping  HENT:     Head: Normocephalic and atraumatic.  Cardiovascular:     Rate and Rhythm: Normal rate.  Pulmonary:     Effort: Pulmonary effort is normal.  Abdominal:     General: Abdomen is flat.  Skin:    General: Skin is warm and dry.  Neurological:     Mental Status: She is alert. Mental status is at baseline.      ED Course / MDM  EKG:EKG Interpretation  Date/Time:  Wednesday August 23 2018 02:07:30 EST Ventricular Rate:  109 PR Interval:    QRS Duration: 66 QT Interval:  367 QTC Calculation: 495 R Axis:   55 Text Interpretation:  Sinus tachycardia Ventricular premature complex LAE, consider biatrial enlargement Anteroseptal infarct, old Nonspecific T abnormalities, lateral leads Baseline wander in lead(s) II Confirmed by Zadie Rhine ([id number]) on 08/23/2018 2:22:04 AM Also confirmed by Zadie Rhine ([id number]), editor Barbette Hair [phone])  on 08/23/2018 7:55:58 AM    I have reviewed the labs performed to date as well as medications administered while in observation.  Recent changes in the last 24 hours include couple of episodes were patient became aggressive. She was given ativan per protocol and I have ordered 10 IM Geodon. Patient was resting sleeping while I approached then room. Patient then woke up once room entered and asked "do you have some of that cocaine" . I asked patient if she had any complaints reports "bye if you have no cocaine"  Plan  Current  plan awaits admission to [facility]. Patient has PRN medication ordered. Will continue to follow.  Patient is under full IVC at this time.   Claude Manges, PA-C 08/31/18 1551    Claude Manges, PA-C 08/31/18 1553    Tegeler, Canary Brim, MD 08/31/18 [phone]

## 2018-08-24 NOTE — ED Notes (Signed)
Pt placed in restraints, almost immediately got out of them. Calm and cooperative at this time, will continue to monitor
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
## 2018-08-24 NOTE — ED Notes (Signed)
RN attempted to call [facility] NP to verifiy order but no answer-[name],RN

## 2018-08-24 NOTE — ED Notes (Signed)
Pt refusing to stay in room. Pt is loudly singing while half dressed.  Pt then  took sanitizer and rubbed all over her face.
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
## 2018-08-24 NOTE — ED Notes (Addendum)
Pt refused EKG, per Dr Rush Landmark may go by EKG from yesterday at this time.  When entering orders for meds, warning flagged on haldol dosage. Attempting to contact Dr Jola Babinski at this time to clarify, as pharmacy was unable to determine if this was a safe dose.

## 2018-08-24 NOTE — ED Notes (Signed)
Pt belongings inventoried. One belongings bag placed in [place] [address]. No valuables with Security, or home meds with Pharmacy.

## 2018-08-24 NOTE — ED Notes (Signed)
Regular, unlabored breathing

## 2018-08-24 NOTE — ED Notes (Signed)
Due to pt's aggressive nature and that she is not currently restrained in any form (willingly took IM geodon) while resting comfortably per Dr Julieanne Manson may hold off on vitals at this time

## 2018-08-24 NOTE — Consult Note (Signed)
  Medication Recommendation   Spoke with Dr. Tamera Punt for medication recommendation.  Informed patient can have Haldol 10 mg/Benadryl 50 mg IM  Q 2 hrs prn agitation as long and QT less than 490.    Spoke with Neldon Labella, RN states she will inform Dr. Landis Gandy B. Rankin, NP

## 2018-08-24 NOTE — ED Notes (Addendum)
Pt awake now and disruptive, standing in hallway demanding juice, soda, sandwiches, and to be given her clothes for discharge. Pt allows NT to take v/s, but refusing to change soiled clothes. Pt in paper scrub top, mesh underwear with gloves stuffed down into them. Pt dons clean gloves, surgical mask. Pt given ice water, OJ, saltines, and graham crackers. Explained guideline regarding meals and snacks, but pt refusing to abide by said guidelines.

## 2018-08-24 NOTE — ED Notes (Addendum)
Pt slipped/pulled out of R wrist restraint. Placed back in restraint. Pt remains agitated, verbally abusive to staff, and refusing to cooperate. Yelling from room, demanding numerous things.

## 2018-08-24 NOTE — ED Notes (Signed)
EDP notified that an ekg cannot be done at this time due to the patient's behavior.

## 2018-08-24 NOTE — ED Notes (Signed)
Patient has been trying to get out her restraints .cursing calling people out there names.Marland Kitchen

## 2018-08-24 NOTE — ED Notes (Signed)
RECHECK PT.VITALSIGNS DUE TO RESTRIANTS  PLACE A  BREIF  ON PT.WHILE SLEEPING  CASE OF VOIDING.

## 2018-08-24 NOTE — ED Notes (Signed)
Pt being verbally aggressive with staff, insisting that she did not get breakfast (pt has had full breakfast tray and two [place] sandwhich bags today). Arguing with security. Willingly took IM ativan.

## 2018-08-24 NOTE — ED Notes (Signed)
Pt told this RN to put shot in her arm

## 2018-08-24 NOTE — ED Notes (Addendum)
RN spoke with Assunta Found BH NP to clarify Haldol order; Order to be placed for 10mg  Q6 hrs PRN for [employer]

## 2018-08-24 NOTE — ED Notes (Signed)
Pt awake, requesting orange juice, soda, [place] sandwhich, and a bedside commode. Bedpan given d/t pt remaining in restraints. Calm at this time, agrees to not be combative pending the removal of wrist restraints. Upon removal of wrist restraints, pt took off own ankle restraints. Still calm, will continue to monitor.

## 2018-08-24 NOTE — ED Notes (Signed)
Retrieved stool from room and disposed. Pt continues to refuse to remain in room. Sitting in chair in hallway, eating crackers. Ice water given to pt and placed in room.

## 2018-08-24 NOTE — ED Notes (Addendum)
Pt continues to refuse to stay in room. Security at bedside. Pt has now removed scrub top and is standing in doorway with only mesh underwear on. Making repeated lude and verbally aggressive remarks at staff. Continues demanding more food and drink, being loud and generally disruptive. Refusing orders from staff. Telling staff to "SHUT UP!!" and threatens "I'll fold you over like a tshirt." Given new, clean scrubs but refuses to put them on.  Produces stool sample in sample jar from inside mesh underwear. Refuses to throw away or give to staff.

## 2018-08-24 NOTE — ED Notes (Signed)
Pt biting and scratching at NT, sitter and this RN. Eudelia Bunch, MD informed.

## 2018-08-24 NOTE — ED Notes (Signed)
Breakfast tray ordered, no sharps 

## 2018-08-24 NOTE — ED Notes (Addendum)
Patient has been trying to get her reSTRIANTS OFF

## 2018-08-24 NOTE — BH Assessment (Signed)
[facility] Assessment Progress Note    TTS attempted to re-assess patient at 7:45 am, but she was sleeping and Irving Burton, Charity fundraiser, indicated that patient had been acting out and she did not want to wake her.  At 12:30 pm, Irving Burton called TTS and indicated that patient had to be sedated with Geodon and would not be able to be assessed anytime soon.

## 2018-08-24 NOTE — ED Notes (Addendum)
Patient has been biting her restraints off we had to reajust restriants muilp,times  Nurse MARIO RN.GAVE  HER  GEODON.NOW SHE IS SLEEPING NOW

## 2018-08-24 NOTE — ED Notes (Signed)
Pt remains agitated and verbally abusive. Requires frequent verbal reminders to stop biting at her wrist restraints.

## 2018-08-24 NOTE — ED Notes (Signed)
Pt nearly fell putting socks on, refuses to allow sitter to assist or to sit down.

## 2018-08-24 NOTE — ED Notes (Addendum)
Pt getting more agitated again. Demanding more food, drink. Never satisfied with what she's given or explanations of area guidelines. Returned to room only with assistance of Security. Pt remains insulting and verbally abusive.

## 2018-08-24 NOTE — ED Notes (Signed)
Intermittent agitation, in out of room. Pt is convinced that she has not eaten today despite having breakfast, lunch, and multiple snacks. Insists that she gets 3 ensures a day. Able to be directed back to room and bed by security at this time

## 2018-08-24 NOTE — ED Notes (Signed)
Pt continues to attempt to get out of restraints by pulling them off with her teeth. Yelling and insulting at staff. Stops biting at restraints when staff in room, but continues doing it as soon as staff steps out.

## 2018-08-24 NOTE — Progress Notes (Signed)
[facility] application has been completed and sent to [facility] for review. [facility] authorization number is: [id number]  CSW will continue to assist with inpatient placement needs.   Wells Guiles, LCSW, LCAS Disposition CSW [facility]/TTS [phone]

## 2018-08-25 ENCOUNTER — Other Ambulatory Visit: Payer: Self-pay

## 2018-08-25 DIAGNOSIS — F23 Brief psychotic disorder: Secondary | ICD-10-CM | POA: Diagnosis not present

## 2018-08-25 MED ORDER — DIPHENHYDRAMINE HCL 25 MG PO CAPS
50.0000 mg | ORAL_CAPSULE | Freq: Once | ORAL | Status: AC
  Administered 2018-08-25: 50 mg via ORAL
  Filled 2018-08-25 (×2): qty 2

## 2018-08-25 MED ORDER — HALOPERIDOL 5 MG PO TABS
10.0000 mg | ORAL_TABLET | Freq: Once | ORAL | Status: AC
  Administered 2018-08-25: 10 mg via ORAL
  Filled 2018-08-25 (×2): qty 2

## 2018-08-25 MED ORDER — ONDANSETRON 4 MG PO TBDP: 4.0000 mg | ORAL_TABLET | Freq: Three times a day (TID) | ORAL | Status: DC | PRN

## 2018-08-25 NOTE — ED Notes (Signed)
Pt is cooperative but manic, singing loudly and having flight of ideas with frequent mood swings into sadness. Asked Dr Julieanne Manson if can give PRNs PO in order to keep pt from escalating to agitation. Pt refuses PO meds, states that she would rather take the shots. Verbalizes understanding that the medications are helping her, but would rather have the shots. IM injections given.

## 2018-08-25 NOTE — Progress Notes (Signed)
CSW spoke with admissions @ [facility]. Pt is currently on their waiting list. Due to pt's aggression, CSW requested that pt be placed on their priority waiting list. Admissions staff are currently reviewing and will contact CSW back about this request.   Wells Guiles, LCSW, LCAS Disposition CSW [facility]/TTS [phone]

## 2018-08-25 NOTE — ED Notes (Signed)
Pt woke up and started demanding her Ensure; pt was advised she has been given ensure a couple of hours prior; pt demanding all her medications; pt was rambling and not making sense; pt demanded to speak with charge RN regarding not being able to change her clothes even though pt was allowed to shower at 11pm tonight; pt walked to the other side of unit and was asked to return to room; pt sat down in a chair and states she will wait for charge RN; Consulting civil engineer and security called to unit and medications administered; Pt voluntarily let Andrew,RN administer medications; Pt is now sitting outside of room in a chair braiding her hair and staring at staff-[name],RN

## 2018-08-25 NOTE — ED Notes (Addendum)
Pt took shots willingly administered by Thayer Ohm RN   1015: adamantly refusing to take PO meds, demanding ensure, wandering out of room, redirected to room by security. Was drinking milk while this RN was outside the room, turned around and started gagging, then informed security that this RN had caused the choking and then stated this RN had poisoned her milk.

## 2018-08-25 NOTE — ED Notes (Signed)
[facility] called, Problems with IVC paperwork. Requests for complete IVC papers to be sent over so she can review it. IVC papers faxed.

## 2018-08-25 NOTE — ED Notes (Signed)
Called Magistrate Oliver to ask about the need for ivc papers to be redone due to name on the papers not having full last names on them. States that the ivc papers do need to be redone and the md needs to redo them. Will speak with md.

## 2018-08-25 NOTE — ED Notes (Signed)
Pt's tray arrived with eggs, states she is allergic to eggs and wants [place] toast sticks. Refuses to return house tray and insists on eating it, informed that she cannot get second tray if accepting the first. Then told this RN to get out of her room.

## 2018-08-25 NOTE — ED Notes (Signed)
Received call from Maralyn Sago [facility]. Wants to redo IVC paperwork due to the papers stating pts name of Christine Cobb and not [employer]. Will speak with md.

## 2018-08-25 NOTE — ED Notes (Addendum)
Does not like this RN but cooperative with sitter Making two phone calls, verbalizes understanding of two phone call policy.

## 2018-08-25 NOTE — ED Notes (Signed)
Patient asked if her eye was ever found; RN advised pt that there is not notations of eye being found; RN checked logged belongings and did not see eye logged-[name],RN

## 2018-08-25 NOTE — Progress Notes (Signed)
Received a phone call from patient's RN requested me to review chart.  She informed me that patient's EKG was done and her QTC was 469 as this requested by previous provider due to Haldol use.  There was some miscommunication of understanding the orders and it is not required daily for an EKG.  Would recommend another EKG be done in approximately 2 to 3 days just to continue monitoring patient's QTC with antipsychotic use.

## 2018-08-25 NOTE — ED Notes (Signed)
Pt's EKG completed, not showing up in chart (will scan into medical records). QTc 469, Dr Criss Alvine personally reviewed print out copy and ok to give meds.

## 2018-08-25 NOTE — ED Provider Notes (Signed)
Psychiatric Default provider note:   Patient is a 43 year old female with a past medical history as below presenting for disordered behavior and psychosis.  She continues to be psychotic and intermittently agitated.  PRN Haldol and Benadryl ordered per psychiatric team.  Past Medical History:  Diagnosis Date  . Arm pain   . Bipolar affective disorder, currently manic, mild (HCC)   . CHF (congestive heart failure) (HCC)   . Eye globe prosthesis   . HTN (hypertension)   . Hyperthyroidism   . Sinus tachycardia    Vital signs reviewed over the past 24 hours and demonstrate intermittent tachycardia, appearing to be correlating with episodes of patient's agitation and pacing around her room.  Resting heart rates are normal.  EKG this AM shows no QT/QTc prolongation.   On my evaluation this morning, patient is slightly agitated, perseverating over her Ensure order.  She is not responding to internal stimuli.  Normal and symmetric gait.  Lung exam without evidence of rales or fluid overload.  Patient currently awaiting placement by social work.  Patient meets inpatient criteria and is under IVC.  We will continue to monitor.   Elisha Ponder, PA-C 08/25/18 0755    Jacalyn Lefevre, MD 08/25/18 [phone]

## 2018-08-25 NOTE — [facility] Counselor (Signed)
This TTS counselor received a phone call from [place], [place] stating patient was awake but would need to be sedated shortly and that I could complete my assessment at this time. Patient is clearly agitated, when NT put telepsych machine in room patient states "You better get that shit out of my face." With encouragement patient agreed to speak with me. 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 Her speech is rapid, pressured, and her thought process is disorganized. When asked what brought her to the hospital in the first place patient states "I'm pregnant and having a bad reaction." She then expressed that she was "angry because she did not get to order my french toast. My medicaid allows for me to have 3." Patient not oriented to situation, is delusional, agitated. She continues to meet in patient criteria.

## 2018-08-25 NOTE — ED Notes (Signed)
Patient denies pain and is resting comfortably. Pt ambulatory to restroom with sitter. Requests a depends. One given to patient.

## 2018-08-25 NOTE — Progress Notes (Signed)
[facility] is requesting most current labs, medical notes, discharge summary and medications. Spoke with Irving Burton @ [facility] ED who will send pt's IVC paperwork to [facility]. A discharge summary can not be created due to pt being involuntarily committed.   Wells Guiles, LCSW, LCAS Disposition CSW [facility]/TTS [phone]

## 2018-08-26 DIAGNOSIS — F23 Brief psychotic disorder: Secondary | ICD-10-CM | POA: Diagnosis not present

## 2018-08-26 MED ORDER — ALBUTEROL SULFATE HFA 108 (90 BASE) MCG/ACT IN AERS
1.0000 | INHALATION_SPRAY | RESPIRATORY_TRACT | Status: DC | PRN
  Administered 2018-08-26: 2 via RESPIRATORY_TRACT
  Filled 2018-08-26: qty 6.7

## 2018-08-26 NOTE — ED Notes (Addendum)
Pt refusing po med - Abilify. States "I take the shot". Pt eating breakfast and is requesting another tray - advised pt unable to provide. Pt asking person who delivered tray to bring her "what I ordered" and began to specify multiple food items. Pt asking for "my asthma pump". Advised pt will have to ask EDP to order. Pt states "It's in my pants pocket!" Noted to be becoming irritable - states "I want to be sent to another facility and my name is Christine Cobb" then spells Christine Cobb.

## 2018-08-26 NOTE — ED Notes (Signed)
Deputy arrived at approx 1345. Woke pt - pt ambulated to bathroom and back to room. Pt noted to be irritable. Pt initially refused for VS's to be performed until after she ate lunch. Then pt yelled "I didn't tell her that!" "I need my inhaler, my clothes, and my Ensure". Advised pt all of her belongings - given to deputy. Voiced understanding. Pt eating lunch then will be transported.

## 2018-08-26 NOTE — ED Notes (Signed)
Called pharmacy for Pts ensure.

## 2018-08-26 NOTE — ED Notes (Signed)
Report called to [facility] - Left message for Deputy re: need for transport.

## 2018-08-26 NOTE — ED Notes (Addendum)
Pt awake now - irritable. States she wants her banana and cereal that she had asked for at breakfast. Advised pt "House trays" are delivered - no special items. Pt noted to be irritable. Pt refuses to wear ID bracelet - removes. Pt given inhaler, Ensure, and graham crackers as asked. Pt refused to return inhaler. Security aware and will assist.

## 2018-08-26 NOTE — ED Notes (Signed)
Received new IVC papers from [employer]. Faxed to [facility]. Copy made for medical records and placed in drawer. 3 copies made and placed on chart. Original placed in red folder in med room.

## 2018-08-26 NOTE — ED Notes (Signed)
Breakfast Tray Ordered. 

## 2018-08-26 NOTE — BH Assessment (Signed)
Reassessment--Pt was standing in front of monitor during reassessment with loud, aggressive, argumentative speech and threatening affect, saying, "I demand to be transferred immediately to [facility] [place], or [place],; they are not taking care of me here" I need my asthma pump". When asked what symptoms she is having, pt states that she is having none, but she says that she did not sleep at all last night while hospital staff documented that she was asleep upon their arrival on shift. Pt states, "I don't need anything, I have 7 degrees, I speak [place], and two other languages". Pt denies SI, but then refused to answer any more questions and then demanded again to be transferred to another hospital. She then walked away from monitor and hospital staff was heard redirecting her so that she would not trip over cords in the room. Per Reola Calkins, NP, pt continues to meet IP treatment and TTS continues to seek IP treatment.

## 2018-08-26 NOTE — Progress Notes (Addendum)
CSW faxed over corrected/new IVC paperwork to [facility]. CSW spoke with Clearance Coots in screenings and admissions who stated that patient does have a bed there but they were still waiting for the IVC paperwork. CSW stated that it should be over shortly. Clearance Coots stated that he would make the charge nurse aware that paperwork would be over shortly.   CSW contacted by Vivien Rossetti, RN regarding receipt of IVC paperwork. CSW was advised that Findings and Custody order had never been completed as served and that the QPE is dated 08/23/2018 while all other paperwork has 08/26/2018. CSW advised Kriste Basque, RN regarding this issue.   Vilma Meckel. Algis Greenhouse, MSW, LCSW Clinical Social Work/Disposition Phone: [phone] Fax: [phone]

## 2018-08-26 NOTE — ED Notes (Addendum)
Pt yelling "You can go to hell!" Pt tolerated injection well. Pt noted to be talking loudly, inappropriate language. Pt had placed inhaler in un-lockable drawer - removed. Pt noted to be delusional - states the inhaler belongs to her sister.

## 2018-08-26 NOTE — ED Notes (Signed)
IVC papers served - copy faxed to [facility], copy sent to Medical Records, original placed in folder for Magistrate, and all 3 sets on clipboard.  

## 2018-08-26 NOTE — ED Notes (Addendum)
Pt removed gloves from glove box and placed on her hands. Pt also noted w/face mask. Pt noted to be talking loudly. Pt continued to refuse po meds offered. Stated wanted injections - given - Pt tolerated well.

## 2018-08-26 NOTE — ED Notes (Signed)
IVC papers faxed to Magistrate - re-done as requested by [facility]. Verified receipt w/Magistrate. Findings and Custody received - called [facility] and requested for Technical sales engineer to serve papers.

## 2018-08-26 NOTE — ED Notes (Signed)
Deputy advised she is planning to arrive around 1330. ALL belongings - 1 labeled belongings bag - Deputy.

## 2018-08-26 NOTE — ED Notes (Signed)
Called pharmacy for pts ensure. Will tube it to purple.

## 2018-08-26 NOTE — ED Notes (Addendum)
Service response contacted, lunch tray already ordered for pt 

## 2018-08-26 NOTE — ED Notes (Signed)
Left message for Deputy x 2.

## 2018-08-26 NOTE — ED Notes (Signed)
Pt ambulatory to nurses' desk - asking for Ensure repeatedly and asking for breakfast. Advised pt may have when arrives. Pt returned to room after much encouragement.

## 2018-08-26 NOTE — ED Notes (Signed)
Dr. Clayborne Dana to redo ivc papers. Graciella Belton, at [facility], States that a few pages are missing.

## 2018-08-26 NOTE — ED Notes (Signed)
Pt called her uncle from phone at nurses' desk. Aware of 2-phone call limit per day. Pt continuing to ask for Inhaler. Pt noted to be breathing and talking w/o difficulty.

## 2018-08-26 NOTE — ED Provider Notes (Signed)
Emergency Medicine Observation Re-evaluation Note  Christine Cobb is a 43 y.o. female, seen on rounds today.  Pt initially presented to the ED for complaints of Medical Clearance Currently, the patient is eating snack.  Physical Exam  BP 102/82 (BP Location: Right Arm)   Pulse (!) 109   Temp 97.9 F (36.6 C) (Oral)   Resp 18   Ht 5\' 4"  (1.626 m)   Wt 56.7 kg   SpO2 99%   BMI 21.46 kg/m  Physical Exam  ED Course / MDM  EKG:EKG Interpretation  Date/Time:  Wednesday August 23 2018 02:07:30 EST Ventricular Rate:  109 PR Interval:    QRS Duration: 66 QT Interval:  367 QTC Calculation: 495 R Axis:   55 Text Interpretation:  Sinus tachycardia Ventricular premature complex LAE, consider biatrial enlargement Anteroseptal infarct, old Nonspecific T abnormalities, lateral leads Baseline wander in lead(s) II Confirmed by Zadie Rhine ([id number]) on 08/23/2018 2:22:04 AM Also confirmed by Zadie Rhine ([id number]), editor Barbette Hair [phone])  on 08/23/2018 7:55:58 AM    I have reviewed the labs performed to date as well as medications administered while in observation.  Recent changes in the last 24 hours include IVC papers resubmitted due to name incomplete, requesting her inhaler. Plan  Current plan is for inhaler ordered, no respiratory distress at this time. Patient is under full IVC at this time.   Jeannie Fend, PA-C 08/26/18 1009    Virgina Norfolk, DO 08/26/18 1058

## 2018-08-26 NOTE — Progress Notes (Addendum)
CSW received corrected IVC paperwork which was faxed over for review to [facility]. CSW called to confirm receipt of paperwork. At this time Vivien Rossetti, RN confirmed that pt was being accepted today at [facility]. Number to call report is [phone]. Pt is to arrive in the afternoon. Kriste Basque, RN notified of disposition.  Vilma Meckel. Algis Greenhouse, MSW, LCSW Clinical Social Work/Disposition Phone: [phone] Fax: [phone]
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
## 2018-08-29 ENCOUNTER — Ambulatory Visit: Payer: Self-pay | Admitting: Physician Assistant

## 2018-08-30 ENCOUNTER — Encounter: Payer: Self-pay | Admitting: Physician Assistant

## 2018-09-18 ENCOUNTER — Telehealth: Payer: Self-pay | Admitting: Pulmonary Disease

## 2018-09-18 NOTE — Telephone Encounter (Signed)
First number provided was not completed, unable to contact it. Second number contacted and was unable to reach anyone as it rang busy. Will call back.

## 2018-09-19 NOTE — Telephone Encounter (Signed)
Archie Patten, NP, is requesting pt have Tele Hosp f/up. Pt will be d/c Wednesday 03/25  Cb is [phone]

## 2018-09-19 NOTE — Telephone Encounter (Signed)
No need to follow up at present   If follow up needed, schedule televisit with APP

## 2018-09-19 NOTE — Telephone Encounter (Signed)
Returned phone call to [place], she states although the hospital visit was not for pulmonary reasons the NP is requesting the patient f/u with both PCP and Pulmonary. Made aware I would make her provider aware and see how he thought is was best to handle this.   AO please advise, if you would like to see patient in office or do a virtual visit.

## 2018-09-19 NOTE — Telephone Encounter (Signed)
Call made to [place], [place] Visit Hsp F/U made per AO request with APP. Please contact patient at this number for visit [phone] this is her sisters number Rennis Petty. Nothing further is needed at this time.

## 2018-09-21 ENCOUNTER — Ambulatory Visit (INDEPENDENT_AMBULATORY_CARE_PROVIDER_SITE_OTHER): Payer: Medicare HMO | Admitting: Pulmonary Disease

## 2018-09-21 ENCOUNTER — Other Ambulatory Visit: Payer: Self-pay

## 2018-09-21 ENCOUNTER — Telehealth: Payer: Self-pay | Admitting: Pulmonary Disease

## 2018-09-21 ENCOUNTER — Encounter: Payer: Self-pay | Admitting: Pulmonary Disease

## 2018-09-21 DIAGNOSIS — R0602 Shortness of breath: Secondary | ICD-10-CM

## 2018-09-21 NOTE — Telephone Encounter (Signed)
09/21/2018 1342  I have attempted to reach the patient multiple times for her scheduled tele-visit.  Unfortunately there is no way for [place] to leave a voicemail.  We will close this encounter for now.  Will route to Dr. Wynona Neat as Lorain Childes.  Elisha Headland FNP

## 2018-09-21 NOTE — Patient Instructions (Signed)
We have attempted to contact you regarding your tele-visit that was scheduled for 09/21/2018 at 1:30 PM  We were unable to reach you.  We have contacted you 3 times.  You do not have a voicemail set up.    When you receive this please contact our office to update your telephone number.  As well as to schedule a tele-visit if you still have breathing issues or concerns.    Coronavirus (COVID-19) Are you at risk?  Are you at risk for the Coronavirus (COVID-19)?  To be considered HIGH RISK for Coronavirus (COVID-19), you have to meet the following criteria:  . Traveled to [place], [place], [place], [place] or [place]; or in the [place] to [place], [place], [place], or [place]; and have fever, cough, and shortness of breath within the last 2 weeks of travel OR . Been in close contact with a person diagnosed with COVID-19 within the last 2 weeks and have fever, cough, and shortness of breath . IF YOU DO NOT MEET THESE CRITERIA, YOU ARE CONSIDERED LOW RISK FOR COVID-19.  What to do if you are HIGH RISK for COVID-19?  Marland Kitchen If you are having a medical emergency, call 911. . Seek medical care right away. Before you go to a doctor's office, urgent care or emergency department, call ahead and tell them about your recent travel, contact with someone diagnosed with COVID-19, and your symptoms. You should receive instructions from your physician's office regarding next steps of care.  . When you arrive at healthcare provider, tell the healthcare staff immediately you have returned from visiting [place], [place], [place], [place] or [place]; or traveled in the [place] to [place], [place], [place], or [place]; in the last two weeks or you have been in close contact with a person diagnosed with COVID-19 in the last 2 weeks.   . Tell the health care staff about your symptoms: fever, cough and shortness of breath. . After you have been seen by a medical provider, you will be  either: o Tested for (COVID-19) and discharged home on quarantine except to seek medical care if symptoms worsen, and asked to  - Stay home and avoid contact with others until you get your results (4-5 days)  - Avoid travel on public transportation if possible (such as bus, train, or airplane) or o Sent to the Emergency Department by EMS for evaluation, COVID-19 testing, and possible admission depending on your condition and test results.  What to do if you are LOW RISK for COVID-19?  Reduce your risk of any infection by using the same precautions used for avoiding the common cold or flu:  Marland Kitchen Wash your hands often with soap and warm water for at least 20 seconds.  If soap and water are not readily available, use an alcohol-based hand sanitizer with at least 60% alcohol.  . If coughing or sneezing, cover your mouth and nose by coughing or sneezing into the elbow areas of your shirt or coat, into a tissue or into your sleeve (not your hands). . Avoid shaking hands with others and consider head nods or verbal greetings only. . Avoid touching your eyes, nose, or mouth with unwashed hands.  . Avoid close contact with people who are sick. . Avoid places or events with large numbers of people in one location, like concerts or sporting events. . Carefully consider travel plans you have or are making. . If you are planning any travel outside or inside the  [place], visit the CDC's Travelers' Health webpage for the latest health notices. . If you have some symptoms but not all symptoms, continue to monitor at home and seek medical attention if your symptoms worsen. . If you are having a medical emergency, call 911.   ADDITIONAL HEALTHCARE OPTIONS FOR PATIENTS  [facility] / e-Visit: [email or web address]         [facility]: [phone]  [facility]: [phone]                   [facility]: [phone]            It is flu season:   >>> Best ways to protect herself from the flu: Receive the yearly flu vaccine, practice good hand hygiene washing with soap and also using hand sanitizer when available, eat a nutritious meals, get adequate rest, hydrate appropriately   Please contact the office if your symptoms worsen or you have concerns that you are not improving.   Thank you for choosing [facility] for your healthcare, and for allowing [place] to partner with you on your healthcare journey. I am thankful to be able to provide care to you today.   Elisha Headland FNP-C

## 2018-09-21 NOTE — Telephone Encounter (Signed)
Attempted to call both the patient number and emergency contact number. I was unable to reach anyone on either and could not leave a message. Will call back.

## 2018-09-21 NOTE — Telephone Encounter (Signed)
Ok. Nothing further is needed.   Triage can close this encounter.   Arlys John

## 2018-09-21 NOTE — Telephone Encounter (Signed)
Okay to close the encounter  Patient can call if she requires our assistance

## 2018-09-21 NOTE — Telephone Encounter (Signed)
09/21/2018 1358  Triage,  Can you please continue to out reach the patient.  She was unavailable for her tele-visit that she was scheduled to complete with me today.  I have attempted her multiple times so she does not have voicemail set up.  Please try to contact her daily to get her scheduled with [place] at a later date.  Elisha Headland, FNP

## 2018-09-21 NOTE — Progress Notes (Signed)
See telephone note from 09/21/2018.  Patient unable to be reached.  Have attempted to reach the patient 3 times.  Unable to leave voicemail as patient does not have voicemail set up.  Elisha Headland, FNP

## 2018-10-05 ENCOUNTER — Emergency Department (HOSPITAL_COMMUNITY): Payer: Medicare HMO

## 2018-10-05 ENCOUNTER — Encounter (HOSPITAL_COMMUNITY): Payer: Self-pay | Admitting: Emergency Medicine

## 2018-10-05 ENCOUNTER — Emergency Department (HOSPITAL_COMMUNITY)
Admission: EM | Admit: 2018-10-05 | Discharge: 2018-10-05 | Disposition: A | Discharge status: Home / Self Care | Payer: Medicare HMO | Attending: Emergency Medicine | Admitting: Emergency Medicine

## 2018-10-05 DIAGNOSIS — F250 Schizoaffective disorder, bipolar type: Secondary | ICD-10-CM | POA: Insufficient documentation

## 2018-10-05 DIAGNOSIS — R609 Edema, unspecified: Secondary | ICD-10-CM

## 2018-10-05 DIAGNOSIS — Z79899 Other long term (current) drug therapy: Secondary | ICD-10-CM | POA: Diagnosis not present

## 2018-10-05 DIAGNOSIS — F17210 Nicotine dependence, cigarettes, uncomplicated: Secondary | ICD-10-CM | POA: Insufficient documentation

## 2018-10-05 DIAGNOSIS — F1210 Cannabis abuse, uncomplicated: Secondary | ICD-10-CM | POA: Diagnosis not present

## 2018-10-05 DIAGNOSIS — R2243 Localized swelling, mass and lump, lower limb, bilateral: Secondary | ICD-10-CM | POA: Insufficient documentation

## 2018-10-05 DIAGNOSIS — I110 Hypertensive heart disease with heart failure: Secondary | ICD-10-CM | POA: Diagnosis not present

## 2018-10-05 DIAGNOSIS — Z9114 Patient's other noncompliance with medication regimen: Secondary | ICD-10-CM | POA: Insufficient documentation

## 2018-10-05 DIAGNOSIS — Z7982 Long term (current) use of aspirin: Secondary | ICD-10-CM | POA: Insufficient documentation

## 2018-10-05 DIAGNOSIS — F1910 Other psychoactive substance abuse, uncomplicated: Secondary | ICD-10-CM | POA: Insufficient documentation

## 2018-10-05 DIAGNOSIS — I509 Heart failure, unspecified: Secondary | ICD-10-CM | POA: Diagnosis not present

## 2018-10-05 DIAGNOSIS — Z046 Encounter for general psychiatric examination, requested by authority: Secondary | ICD-10-CM | POA: Diagnosis not present

## 2018-10-05 LAB — RAPID URINE DRUG SCREEN, HOSP PERFORMED
Amphetamines: NOT DETECTED
Barbiturates: NOT DETECTED
Benzodiazepines: NOT DETECTED
Cocaine: POSITIVE — AB
Opiates: NOT DETECTED
Tetrahydrocannabinol: NOT DETECTED

## 2018-10-05 LAB — CBC WITH DIFFERENTIAL/PLATELET
Abs Immature Granulocytes: 0.05 10*3/uL (ref 0.00–0.07)
Basophils Absolute: 0.1 10*3/uL (ref 0.0–0.1)
Basophils Relative: 1 %
Eosinophils Absolute: 0.1 10*3/uL (ref 0.0–0.5)
Eosinophils Relative: 2 %
HCT: 35.9 % — ABNORMAL LOW (ref 36.0–46.0)
Hemoglobin: 11.1 g/dL — ABNORMAL LOW (ref 12.0–15.0)
Immature Granulocytes: 1 %
Lymphocytes Relative: 21 %
Lymphs Abs: 1.4 10*3/uL (ref 0.7–4.0)
MCH: 26.1 pg (ref 26.0–34.0)
MCHC: 30.9 g/dL (ref 30.0–36.0)
MCV: 84.5 fL (ref 80.0–100.0)
Monocytes Absolute: 0.8 10*3/uL (ref 0.1–1.0)
Monocytes Relative: 12 %
Neutro Abs: 4.3 10*3/uL (ref 1.7–7.7)
Neutrophils Relative %: 63 %
Platelets: 391 10*3/uL (ref 150–400)
RBC: 4.25 MIL/uL (ref 3.87–5.11)
RDW: 19.5 % — ABNORMAL HIGH (ref 11.5–15.5)
WBC: 6.8 10*3/uL (ref 4.0–10.5)
nRBC: 0 % (ref 0.0–0.2)

## 2018-10-05 LAB — ACETAMINOPHEN LEVEL: Acetaminophen (Tylenol), Serum: <10 ug/mL — ABNORMAL LOW (ref 10–30)

## 2018-10-05 LAB — COMPREHENSIVE METABOLIC PANEL
ALT: 38 U/L (ref 0–44)
AST: 34 U/L (ref 15–41)
Albumin: 2.8 g/dL — ABNORMAL LOW (ref 3.5–5.0)
Alkaline Phosphatase: 91 U/L (ref 38–126)
Anion gap: 9 (ref 5–15)
BUN: 17 mg/dL (ref 6–20)
CO2: 20 mmol/L — ABNORMAL LOW (ref 22–32)
Calcium: 8.4 mg/dL — ABNORMAL LOW (ref 8.9–10.3)
Chloride: 109 mmol/L (ref 98–111)
Creatinine, Ser: 1.15 mg/dL — ABNORMAL HIGH (ref 0.44–1.00)
GFR calc Af Amer: >60 mL/min (ref >60)
GFR calc non Af Amer: 59 mL/min — ABNORMAL LOW (ref >60)
Glucose, Bld: 102 mg/dL — ABNORMAL HIGH (ref 70–99)
Potassium: 3.7 mmol/L (ref 3.5–5.1)
Sodium: 138 mmol/L (ref 135–145)
Total Bilirubin: 0.8 mg/dL (ref 0.3–1.2)
Total Protein: 6.8 g/dL (ref 6.5–8.1)

## 2018-10-05 LAB — I-STAT BETA HCG BLOOD, ED (MC, WL, AP ONLY): I-stat hCG, quantitative: <5 m[IU]/mL (ref <5)

## 2018-10-05 LAB — PREGNANCY, URINE: Preg Test, Ur: NEGATIVE

## 2018-10-05 LAB — ETHANOL: Alcohol, Ethyl (B): <10 mg/dL (ref <10)

## 2018-10-05 LAB — SALICYLATE LEVEL: Salicylate Lvl: <7 mg/dL (ref 2.8–30.0)

## 2018-10-05 LAB — BRAIN NATRIURETIC PEPTIDE: B Natriuretic Peptide: 789.4 pg/mL — ABNORMAL HIGH (ref 0.0–100.0)

## 2018-10-05 MED ORDER — ALBUTEROL SULFATE HFA 108 (90 BASE) MCG/ACT IN AERS: 2.0000 | INHALATION_SPRAY | RESPIRATORY_TRACT | Status: DC | PRN

## 2018-10-05 MED ORDER — POTASSIUM CHLORIDE CRYS ER 20 MEQ PO TBCR
40.0000 meq | EXTENDED_RELEASE_TABLET | Freq: Once | ORAL | Status: AC
  Administered 2018-10-05: 40 meq via ORAL
  Filled 2018-10-05: qty 2

## 2018-10-05 MED ORDER — LEVOFLOXACIN 750 MG PO TABS
750.0000 mg | ORAL_TABLET | Freq: Once | ORAL | Status: AC
  Administered 2018-10-05: 750 mg via ORAL
  Filled 2018-10-05: qty 1

## 2018-10-05 MED ORDER — ONDANSETRON HCL 4 MG PO TABS: 4.0000 mg | ORAL_TABLET | Freq: Three times a day (TID) | ORAL | Status: DC | PRN

## 2018-10-05 MED ORDER — NICOTINE POLACRILEX 2 MG MT GUM: 2.0000 mg | CHEWING_GUM | OROMUCOSAL | Status: DC | PRN

## 2018-10-05 MED ORDER — MIRTAZAPINE 7.5 MG PO TABS: 15.0000 mg | ORAL_TABLET | Freq: Every day | ORAL | Status: DC

## 2018-10-05 MED ORDER — HALOPERIDOL DECANOATE 100 MG/ML IM SOLN
100.0000 mg | Freq: Once | INTRAMUSCULAR | Status: AC
  Administered 2018-10-05: 13:00:00 100 mg via INTRAMUSCULAR
  Filled 2018-10-05: qty 1

## 2018-10-05 MED ORDER — FUROSEMIDE 40 MG PO TABS
40.0000 mg | ORAL_TABLET | Freq: Once | ORAL | Status: AC
  Administered 2018-10-05: 09:00:00 40 mg via ORAL
  Filled 2018-10-05: qty 1

## 2018-10-05 MED ORDER — FUROSEMIDE 20 MG PO TABS: 20.0000 mg | ORAL_TABLET | Freq: Every day | ORAL | 1 refills | Status: DC | PRN

## 2018-10-05 MED ORDER — FUROSEMIDE 20 MG PO TABS: 20.0000 mg | ORAL_TABLET | Freq: Every day | ORAL | 0 refills | Status: DC | PRN

## 2018-10-05 MED ORDER — ARIPIPRAZOLE 10 MG PO TABS
10.0000 mg | ORAL_TABLET | Freq: Every day | ORAL | Status: DC
  Administered 2018-10-05: 11:00:00 10 mg via ORAL
  Filled 2018-10-05: qty 1

## 2018-10-05 MED ORDER — FUROSEMIDE 20 MG PO TABS
20.0000 mg | ORAL_TABLET | Freq: Every day | ORAL | Status: DC | PRN
  Filled 2018-10-05: qty 1

## 2018-10-05 MED ORDER — AMANTADINE HCL 100 MG PO CAPS
100.0000 mg | ORAL_CAPSULE | Freq: Two times a day (BID) | ORAL | Status: DC
  Administered 2018-10-05: 100 mg via ORAL
  Filled 2018-10-05: qty 1

## 2018-10-05 MED ORDER — DIGOXIN 125 MCG PO TABS
0.1250 mg | ORAL_TABLET | Freq: Every day | ORAL | Status: DC
  Administered 2018-10-05: 0.125 mg via ORAL
  Filled 2018-10-05: qty 1

## 2018-10-05 NOTE — ED Notes (Signed)
Bed: GO77 Expected date:  Expected time:  Means of arrival:  Comments: 62F foot swelling

## 2018-10-05 NOTE — BH Assessment (Signed)
[facility] Assessment Progress Note  Per Jacqueline Norman, DO, this pt does not require psychiatric hospitalization at this time.  Pt is to be discharged from [facility] with recommendation to follow up with [facility].  This has been included in pt's discharge instructions.  Pt's nurse, Ashley, has been notified.  Christine Zapanta, MA Triage Specialist [phone]     

## 2018-10-05 NOTE — ED Notes (Signed)
Pt to room #42. Pt pleasant on approach. Informs this nurse she was released from [facility] 2 weeks ago after a 32 day hospitalization. Pt reports she has now ran out of her medication pills. Informs this nurse she prefers monthly injections; not pills to manage her mental health. States she  has not had monthly injection in 3 months and has taken Abilify and Haldol Dec in past. Jacki Cones NP made aware.  Denies SI/HI. Reports she has been self medication with crack/cocaine. Special checks q 15 mins in place for safety, Video monitoring in place. Will continue to monitor.

## 2018-10-05 NOTE — ED Notes (Signed)
Report given to [facility] in [place]

## 2018-10-05 NOTE — ED Notes (Signed)
Per Juleen China MD: Pt cleared to go to [facility] at this time.

## 2018-10-05 NOTE — ED Notes (Addendum)
Per nightshift RN: Pt refusing all blood or other testing to be completed. Upon going into pts room and explaining that the pt will not be able to have anything to eat or drink until blood is obtained. Pt states " I just want to go to behavioral health" Pt informed that in order to go to behavioral she must allow [place] to draw blood. Pt agreeable to allow staff to draw blood. Pt requesting ice chips and cranberry juice. Pt informed that she must allow staff to draw blood first and then she will be provided with ice and juice. Pt remains agreeable. Pt also informed that a urine is needed. Pt states "I dont have to pee right now" Pt informed to let staff know when she was able to provide urine sample. Pt verbalizes understanding

## 2018-10-05 NOTE — ED Notes (Signed)
Pt provided with ice and cranberry juice.

## 2018-10-05 NOTE — ED Notes (Signed)
Pt talking on hallway phone.  

## 2018-10-05 NOTE — ED Notes (Addendum)
Tele psych set up at bedside. Pt awake and speaking with psych
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
## 2018-10-05 NOTE — ED Triage Notes (Signed)
Patient here from street with complaints of bilateral leg swelling. Reports hx of same. Increased more in left. Requesting dilaudid, normal saline, and a blanket.

## 2018-10-05 NOTE — ED Provider Notes (Signed)
Christine Cobb Provider Note   CSN: [id number] Arrival date & time: 10/05/18  0645    History   Chief Complaint Chief Complaint  Patient presents with   Leg Swelling    HPI Christine Cobb is a 43 y.o. female.     HPI   42yF with numerous complaints, but primarily of swelling in LE. She is a difficult historian. When trying to get her to describe her symptoms further she mostly  just cites her past medical history or requests food. She says she has been off of her medications but not sure for how long. She denies any acute pain. No cough or shortness of breath. No fever. No orthopnea.   Past Medical History:  Diagnosis Date   Arm pain    Bipolar affective disorder, currently manic, mild (HCC)    CHF (congestive heart failure) (HCC)    Eye globe prosthesis    HTN (hypertension)    Hyperthyroidism    Sinus tachycardia     Patient Active Problem List   Diagnosis Date Noted   Malnutrition of moderate degree 08/11/2018   Hemoptysis 08/09/2018   Respiratory failure (HCC)    Chest pain    Community acquired pneumonia of right lower lobe of lung (HCC)    Bipolar affective disorder, manic, severe (HCC) 04/30/2017   Severe bipolar affective disorder with psychosis (HCC) 04/29/2017   Cocaine abuse with cocaine-induced mood disorder (HCC) 06/02/2016   Schizoaffective disorder, bipolar type (HCC)    Bipolar disorder, current episode manic without psychotic features, severe (HCC)    Agitation    Manic behavior (HCC)    Hyperthyroidism 09/21/2013   Marijuana abuse 04/12/2013   History of CHF (congestive heart failure) 02/01/2013   Abscess of abdominal wall 10/18/2012   HTN (hypertension) 04/08/2011   Tachycardia 04/08/2011    Past Surgical History:  Procedure Laterality Date   DILATION AND CURETTAGE OF UTERUS     EYE SURGERY       OB History    Gravida  3   Para  2   Term  2   Preterm      AB  1    Living  2     SAB  1   TAB  0   Ectopic      Multiple      Live Births  2            Home Medications    Prior to Admission medications   Medication Sig Start Date End Date Taking? Authorizing Provider  Ascorbic Acid (VITAMIN C PO) Take 1 tablet by mouth daily.   Yes [provider]  aspirin 81 MG chewable tablet Chew 81 mg by mouth daily.   Yes [provider]  diphenhydrAMINE (BENADRYL) 25 MG tablet Take 25 mg by mouth at bedtime as needed for sleep.   Yes [provider]  furosemide (LASIX) 20 MG tablet Take 1 tablet (20 mg total) by mouth daily as needed for fluid or edema. 08/17/18  Yes Leatha Gilding, MD  VITAMIN D PO Take 1 tablet by mouth daily.   Yes [provider]  albuterol (PROVENTIL HFA;VENTOLIN HFA) 108 (90 Base) MCG/ACT inhaler Inhale 2 puffs into the lungs every 4 (four) hours as needed for wheezing or shortness of breath. Patient not taking: Reported on 10/05/2018 08/09/18   Ivonne Andrew, NP  albuterol (PROVENTIL) (2.5 MG/3ML) 0.083% nebulizer solution Take 3 mLs (2.5 mg total) by nebulization every  6 (six) hours as needed for wheezing or shortness of breath. Patient not taking: Reported on 10/05/2018 08/01/18   Tomma Lightning, MD  amantadine (SYMMETREL) 100 MG capsule Take 1 capsule (100 mg total) 2 (two) times daily by mouth. Patient not taking: Reported on 08/24/2018 05/09/17   Oneta Rack, NP  ARIPiprazole (ABILIFY) 10 MG tablet Take 1 tablet (10 mg total) daily by mouth. Patient not taking: Reported on 10/05/2018 05/10/17   Oneta Rack, NP  digoxin (LANOXIN) 0.125 MG tablet Take 1 tablet (0.125 mg total) by mouth daily. Patient not taking: Reported on 08/24/2018 08/18/18   Leatha Gilding, MD  mirtazapine (REMERON) 15 MG tablet Take 1 tablet (15 mg total) at bedtime by mouth. Patient not taking: Reported on 08/24/2018 05/09/17   Oneta Rack, NP  nicotine polacrilex (NICORETTE) 2 MG gum Take 1 each (2 mg  total) by mouth as needed for smoking cessation. Patient not taking: Reported on 08/24/2018 08/09/18   Tomma Lightning, MD    Family History Family History  Problem Relation Age of Onset   Hypertension Other    Emphysema Other    Asthma Son    Diabetes Maternal Uncle    Diabetes Paternal Grandmother     Social History Social History   Tobacco Use   Smoking status: Current Every Day Smoker    Types: Cigarettes    Last attempt to quit: 08/21/2013    Years since quitting: 5.1   Smokeless tobacco: Never Used  Substance Use Topics   Alcohol use: Yes    Comment: Last drink: 1/2 beer PTA   Drug use: Yes    Types: Marijuana    Comment: Pt sts "anything and everything"     Allergies   Amoxil [amoxicillin]; Hydroxyzine; Ketorolac tromethamine; Prenatal [b-plex plus]; Tegretol [carbamazepine]; B-plex plus; and Tegretol [carbamazepine]   Review of Systems Review of Systems  All systems reviewed and negative, other than as noted in HPI.  Physical Exam Updated Vital Signs BP (!) 133/107 (BP Location: Left Arm)    Pulse (!) 111    Temp 97.9 F (36.6 C) (Oral)    Resp 20    Ht 5\' 4"  (1.626 m)    Wt 56.7 kg    SpO2 100%    BMI 21.46 kg/m   Physical Exam Vitals signs and nursing note reviewed.  Constitutional:      General: She is not in acute distress.    Appearance: She is well-developed.  HENT:     Head: Normocephalic and atraumatic.  Eyes:     General:        Right eye: No discharge.        Left eye: No discharge.     Conjunctiva/sclera: Conjunctivae normal.  Neck:     Musculoskeletal: Neck supple.  Cardiovascular:     Rate and Rhythm: Normal rate and regular rhythm.     Heart sounds: Normal heart sounds. No murmur. No friction rub. No gallop.   Pulmonary:     Effort: Pulmonary effort is normal. No respiratory distress.     Breath sounds: Normal breath sounds.  Abdominal:     General: There is no distension.     Palpations: Abdomen is soft.      Tenderness: There is no abdominal tenderness.  Musculoskeletal:        General: No tenderness.     Right lower leg: Edema present.     Left lower leg: Edema present.  Comments: Symmetric pitting LE edema.   Skin:    General: Skin is warm and dry.  Neurological:     Mental Status: She is alert.      ED Treatments / Results  Labs (all labs ordered are listed, but only abnormal results are displayed) Labs Reviewed  COMPREHENSIVE METABOLIC PANEL - Abnormal; Notable for the following components:      Result Value   CO2 20 (*)    Glucose, Bld 102 (*)    Creatinine, Ser 1.15 (*)    Calcium 8.4 (*)    Albumin 2.8 (*)    GFR calc non Af Amer 59 (*)    All other components within normal limits  RAPID URINE DRUG SCREEN, HOSP PERFORMED - Abnormal; Notable for the following components:   Cocaine POSITIVE (*)    All other components within normal limits  CBC WITH DIFFERENTIAL/PLATELET - Abnormal; Notable for the following components:   Hemoglobin 11.1 (*)    HCT 35.9 (*)    RDW 19.5 (*)    All other components within normal limits  ACETAMINOPHEN LEVEL - Abnormal; Notable for the following components:   Acetaminophen (Tylenol), Serum <10 (*)    All other components within normal limits  BRAIN NATRIURETIC PEPTIDE - Abnormal; Notable for the following components:   B Natriuretic Peptide 789.4 (*)    All other components within normal limits  ETHANOL  SALICYLATE LEVEL  PREGNANCY, URINE  I-STAT BETA HCG BLOOD, ED ([facility], [facility], [facility] ONLY)    EKG EKG Interpretation  Date/Time:  Thursday October 05 2018 07:46:11 EDT Ventricular Rate:  106 PR Interval:    QRS Duration: 71 QT Interval:  362 QTC Calculation: 481 R Axis:   29 Text Interpretation:  Sinus tachycardia Borderline low voltage, extremity leads Consider anterior infarct Nonspecific T abnormalities, lateral leads Confirmed by Raeford Razor [phone]) on 10/05/2018 8:07:00 AM   Radiology Dg Chest Portable 1 View  Result Date:  10/05/2018 CLINICAL DATA:  Dyspnea and cough EXAM: PORTABLE CHEST 1 VIEW COMPARISON:  08/10/2018 FINDINGS: Cardiopericardial enlargement. There is new opacity at the right more than left base with probable small pleural effusion. No cephalized blood flow. No pneumothorax. IMPRESSION: 1. Pneumonia or atelectasis at the right base with probable small pleural effusion. 2. Chronic cardiomegaly. Electronically Signed   By: Marnee Spring M.D.   On: 10/05/2018 08:06    Procedures Procedures (including critical care time)  Medications Ordered in ED Medications  amantadine (SYMMETREL) capsule 100 mg (has no administration in time range)  ARIPiprazole (ABILIFY) tablet 10 mg (has no administration in time range)  digoxin (LANOXIN) tablet 0.125 mg (0.125 mg Oral Given 10/05/18 0840)  nicotine polacrilex (NICORETTE) gum 2 mg (has no administration in time range)  albuterol (PROVENTIL HFA;VENTOLIN HFA) 108 (90 Base) MCG/ACT inhaler 2 puff (has no administration in time range)  mirtazapine (REMERON) tablet 15 mg (has no administration in time range)  furosemide (LASIX) tablet 20 mg (has no administration in time range)  furosemide (LASIX) tablet 40 mg (40 mg Oral Given 10/05/18 0840)  potassium chloride SA (K-DUR,KLOR-CON) CR tablet 40 mEq (40 mEq Oral Given 10/05/18 0840)  levofloxacin (LEVAQUIN) tablet 750 mg (750 mg Oral Given 10/05/18 0840)     Initial Impression / Assessment and Plan / ED Course  I have reviewed the triage vital signs and the nursing notes.  Pertinent labs & imaging results that were available during my care of the patient were reviewed by me and considered in my medical decision  making (see chart for details).     42yF with increasing LE edema. Likely from noncompliance with medications. She overall lacks insight but has some understanding of what has been explained to her previously. "The left side of my heart doesn't work like it should. It only works about 20-25% when most people's  works around 50-60%. I might have fluid in my chest. I don't know." This is consistent with ECHO from 2 months ago.   She does have some edema on exam but lacks respiratory symptoms. o2 sats are normal on RA. BNP is elevated though. CXR with effusion and possible infiltrate. She was given a dose of lasix. She appears to normally be on it PRN. I question wether she actually has pneumonia. She lacks fever, cough, dyspnea or leukocytosis to further support this.   She is acting oddly but doesn't seems overtly psychotic or delusional. She denies hallucinations. She denies wanting to harm herself or others. Will obtain TTS consultation but this may just be a matter or getting her back on her medications, both psych and medical.  Discussed with case management. Pt has insurance. I can provide her with new prescriptions for her medications.   Christine Cobb was evaluated in Emergency Department on 10/05/2018 for the symptoms described in the history of present illness. She was evaluated in the context of the global COVID-19 pandemic, which necessitated consideration that the patient might be at risk for infection with the SARS-CoV-2 virus that causes COVID-19. Institutional protocols and algorithms that pertain to the evaluation of patients at risk for COVID-19 are in a state of rapid change based on information released by regulatory bodies including the CDC and federal and state organizations. These policies and algorithms were followed during the patient's care in the ED.   Final Clinical Impressions(s) / ED Diagnoses   Final diagnoses:  Peripheral edema  Noncompliance with medication regimen  Substance abuse [facility](HCC)    ED Discharge Orders    None       Raeford RazorKohut, Lloyd Cullinan, MD 10/05/18 1426

## 2018-10-05 NOTE — ED Notes (Signed)
Pt taking a shower 

## 2018-10-05 NOTE — Discharge Instructions (Addendum)
For your mental health needs, you are advised to follow up with [facility].  Call them at your earliest opportunity to schedule an intake appointment: ° °     [facility] °     [address] °     [place], [place] [address] °     [phone] °     Crisis number: [phone] °

## 2018-10-05 NOTE — ED Notes (Signed)
Pt refuses for any orders to be complete until pt is given food. Pt also requesting water.

## 2018-10-05 NOTE — ED Notes (Signed)
Bed: XBW62 Expected date:  Expected time:  Means of arrival:  Comments: Room 14

## 2018-10-05 NOTE — ED Notes (Signed)
Security notified need to wand pt to go back to [facility]

## 2018-10-05 NOTE — BH Assessment (Addendum)
Tele Assessment Note   Patient Name: Christine Cobb MRN: [id number] Referring Physician: Juleen China Location of Patient: [facility] ED Location of Provider: Behavioral Health TTS Department  Christine Cobb is an 43 y.o. female presenting voluntarily to [facility] ED. Per EDP: "42yF with numerous complaints, but primarily of swelling in LE. She is a difficult historian. When trying to get her to describe her symptoms further she mostly  just cites her past medical history or requests food. She says she has been off of her medications but not sure for how long. She denies any acute pain. No cough or shortness of breath. No fever. No orthopnea. "  Upon this clinician's assessment patient is a poor historian due to AMS and offers little information. Patient has difficulty staying awake during assessment and falls asleep at times. Chart review was utilized in conjunction with patient interview to complete assessment. Patient states she is here because she has "congenital heart failure, schizoaffective bipolar, PTSD and multiple personalities." Patient reports that she is not compliant with medications including Abilify, Haldol, and Xanax. She states "I need to go to behavioral health so I can get my injection." Patient is unable to provide this assessor with her outpatient provider information or whom she lives with. Patient admits to using crack cocaine, alcohol, and THC on 10/04/2018. Sample for UDS has not been collected at time of assessment due to patient's refusal to provide urine or blood. Patient denies SI/HI/AVH at this time. Per chart review, patient has a history of schizoaffective disorder, medication noncompliance, bizarre behavior, and substance use.   Patient is drowsy. She is oriented to self and place. She is dressed in a hospital gown and laying in bed. Patient has difficulty staying awake for assessment. Clinician unable to assess mood, thought content, judgement, or memory due.  Diagnosis: F25.0  Schizoaffective disorder, bipolar type  Past Medical History:  Past Medical History:  Diagnosis Date  . Arm pain   . Bipolar affective disorder, currently manic, mild (HCC)   . CHF (congestive heart failure) (HCC)   . Eye globe prosthesis   . HTN (hypertension)   . Hyperthyroidism   . Sinus tachycardia     Past Surgical History:  Procedure Laterality Date  . DILATION AND CURETTAGE OF UTERUS    . EYE SURGERY      Family History:  Family History  Problem Relation Age of Onset  . Hypertension Other   . Emphysema Other   . Asthma Son   . Diabetes Maternal Uncle   . Diabetes Paternal Grandmother     Social History:  reports that she has been smoking cigarettes. She has never used smokeless tobacco. She reports current alcohol use. She reports current drug use. Drug: Marijuana.  Additional Social History:  Alcohol / Drug Use Pain Medications: see MAR Prescriptions: see MAR Over the Counter: see MAR History of alcohol / drug use?: Yes Substance #1 Name of Substance 1: cocaine 1 - Age of First Use: UTA 1 - Amount (size/oz): varies 1 - Frequency: varies 1 - Duration: since starting 1 - Last Use / Amount: 10/04/2018  CIWA: CIWA-Ar BP: (!) 133/107 Pulse Rate: (!) 118 COWS:    Allergies:  Allergies  Allergen Reactions  . Amoxil [Amoxicillin] Anaphylaxis    Did it involve swelling of the face/tongue/throat, SOB, or low BP? yes Did it involve sudden or severe rash/hives, skin peeling, or any reaction on the inside of your mouth or nose? yes Did you need to seek medical attention  at a hospital or doctor's office? unknown When did it last happen?unknoen If all above answers are "NO", may proceed with cephalosporin use.   Marland Kitchen. Hydroxyzine     Other reaction(s): Bleeding  . Ketorolac Tromethamine Hives and Swelling  . Prenatal [B-Plex Plus] Nausea Only  . Tegretol [Carbamazepine] Other (See Comments)    Swelling, skin peeling, skin discoloration  . B-Plex Plus Nausea  Only    Prenatal   . Tegretol [Carbamazepine] Swelling and Other (See Comments)    Skin peeling Skin discoloring     Home Medications: (Not in a hospital admission)   OB/GYN Status:  No LMP recorded.  General Assessment Data Location of Assessment: [facility] ED TTS Assessment: In system Is this a Tele or Face-to-Face Assessment?: Face-to-Face Is this an Initial Assessment or a Re-assessment for this encounter?: Initial Assessment Patient Accompanied by:: N/A Language Other than English: No Living Arrangements: Homeless/Shelter What gender do you identify as?: Female Marital status: Single Maiden name: none Pregnancy Status: No Living Arrangements: (UTA) Can pt return to current living arrangement?: Yes Admission Status: Voluntary Insurance type: Medicare     Crisis Care Plan Living Arrangements: (UTA) Legal Guardian: (self) Name of Psychiatrist: None Name of Therapist: None  Education Status Is patient currently in school?: No Is the patient employed, unemployed or receiving disability?: Unemployed  Risk to self with the past 6 months Suicidal Ideation: No Has patient been a risk to self within the past 6 months prior to admission? : No Suicidal Intent: No Has patient had any suicidal intent within the past 6 months prior to admission? : No Is patient at risk for suicide?: No Suicidal Plan?: No Has patient had any suicidal plan within the past 6 months prior to admission? : No Access to Means: No What has been your use of drugs/alcohol within the last 12 months?: crack, alcohol, THC Previous Attempts/Gestures: No How many times?: 0 Other Self Harm Risks: none noted Triggers for Past Attempts: Unknown Intentional Self Injurious Behavior: None Family Suicide History: No Recent stressful life event(s): Financial Problems Persecutory voices/beliefs?: No Depression: No Depression Symptoms: Feeling angry/irritable Substance abuse history and/or treatment for substance  abuse?: No Suicide prevention information given to non-admitted patients: Not applicable  Risk to Others within the past 6 months Homicidal Ideation: No Does patient have any lifetime risk of violence toward others beyond the six months prior to admission? : Yes (comment)(combative in ED and with [employer]) Thoughts of Harm to Others: No Current Homicidal Intent: No Current Homicidal Plan: No Access to Homicidal Means: No Identified Victim: none History of harm to others?: Yes Assessment of Violence: On admission Violent Behavior Description[facility]; combative in ED Does patient have access to weapons?: No Criminal Charges Pending?: No Does patient have a court date: No Is patient on probation?: No  Psychosis Hallucinations: (UTA) Delusions: (UTA)  Mental Status Report Appearance/Hygiene: In scrubs Eye Contact: Poor Motor Activity: Freedom of movement Speech: Soft, Slow Level of Consciousness: Drowsy Mood: Other (Comment)(drowsy) Affect: Flat Anxiety Level: None Thought Processes: Unable to Assess Judgement: Impaired Orientation: Person, Place Obsessive Compulsive Thoughts/Behaviors: None  Cognitive Functioning Concentration: Normal Memory: Recent Intact, Remote Intact Is patient IDD: No Insight: Poor Impulse Control: Poor Appetite: (UTA) Have you had any weight changes? : (UTA) Sleep: Decreased Total Hours of Sleep: 0 Vegetative Symptoms: None  ADLScreening [facility] Assessment Services) Patient's cognitive ability adequate to safely complete daily activities?: Yes Patient able to express need for assistance with ADLs?: Yes  Independently performs ADLs?: Yes (appropriate for developmental age)  Prior Inpatient Therapy Prior Inpatient Therapy: Yes Prior Therapy Dates: 2019, 2018 Prior Therapy Facilty/Provider(s): [facility], [facility] Reason for Treatment: mania  Prior Outpatient Therapy Prior Outpatient Therapy: No Does patient have an ACCT team?:  No Does patient have Intensive In-House Services?  : No Does patient have [facility] services? : No Does patient have P4CC services?: No  ADL Screening (condition at time of admission) Patient's cognitive ability adequate to safely complete daily activities?: Yes Is the patient deaf or have difficulty hearing?: No Does the patient have difficulty seeing, even when wearing glasses/contacts?: Yes Does the patient have difficulty concentrating, remembering, or making decisions?: Yes Patient able to express need for assistance with ADLs?: Yes Does the patient have difficulty dressing or bathing?: No Independently performs ADLs?: Yes (appropriate for developmental age) Does the patient have difficulty walking or climbing stairs?: No Weakness of Legs: None Weakness of Arms/Hands: None  Home Assistive Devices/Equipment Home Assistive Devices/Equipment: None  Therapy Consults (therapy consults require a physician order) PT Evaluation Needed: No OT Evalulation Needed: No SLP Evaluation Needed: No Abuse/Neglect Assessment (Assessment to be complete while patient is alone) Abuse/Neglect Assessment Can Be Completed: Unable to assess, patient is non-responsive or altered mental status Values / Beliefs Cultural Requests During Hospitalization: None Spiritual Requests During Hospitalization: None Consults Spiritual Care Consult Needed: No Social Work Consult Needed: No Merchant navy officer (For Healthcare) Does Patient Have a Medical Advance Directive?: No Would patient like information on creating a medical advance directive?: No - Patient declined          Disposition: Per Dr. Sharma Covert and Elta Guadeloupe, NP patient is psych cleared. Disposition Initial Assessment Completed for this Encounter: Yes  This service was provided via telemedicine using a 2-way, interactive audio and video technology.  Names of all persons participating in this telemedicine service and their role in this  encounter. Name: Celedonio Miyamoto, [place] Role: TTS  Name: Christine Cobb Role: patient  Name:  Role:   Name:  Role:     Celedonio Miyamoto 10/05/2018 9:31 AM

## 2018-10-05 NOTE — ED Notes (Signed)
Pt DC d off unit to home per MD. Pt calm, cooperative, no s/s of distress. DC information reviewed and given to pt with acknowledged understanding.pt  Denied SI/HI. Denied A/V hallucinations  Belongings given to pt.  Pt ambulatory off unit, escorted by MT. Pt stated that ride was on the way.

## 2018-10-05 NOTE — [facility] Suicide Risk Assessment (Cosign Needed)
Suicide Risk Assessment  Discharge Assessment   [facility] Discharge Suicide Risk Assessment   Principal Problem: <principal problem not specified> Discharge Diagnoses: Active Problems:   * No active hospital problems. *   Total Time spent with patient: 30 minutes  Musculoskeletal: Strength & Muscle Tone: within normal limits Gait & Station: normal Patient leans: N/A  Psychiatric Specialty Exam:   Blood pressure (!) 133/107, pulse (!) 118, temperature 97.9 F (36.6 C), temperature source Oral, resp. rate 20, height 5\' 4"  (1.626 m), weight 56.7 kg, SpO2 100 %.Body mass index is 21.46 kg/m.  General Appearance: Casual  Eye Contact::  Good  Speech:  Clear and Coherent and Normal Rate409  Volume:  Normal  Mood:  Anxious and Irritable  Affect:  Congruent  Thought Process:  Coherent and Descriptions of Associations: Intact  Orientation:  Full (Time, Place, and Person)  Thought Content:  Logical  Suicidal Thoughts:  No  Homicidal Thoughts:  No  Memory:  Immediate;   Good Recent;   Good Remote;   Fair  Judgement:  Poor  Insight:  Present  Psychomotor Activity:  Normal  Concentration:  Good  Recall:  Fair  Fund of Knowledge:Fair  Language: Good  Akathisia:  No  Handed:  Right  AIMS (if indicated):     Assets:  Communication Skills Physical Health  Sleep:     Cognition: WNL  ADL's:  Intact   Mental Status Per Nursing Assessment::   On Admission:    Pt was seen and chart reviewed with treatment team and Dr Sharma Covert. Pt denies suicidal/homicidal ideation, denies auditory/visual hallucinations and does not appear to be responding to internal stimuli. Pt stated she came into the hospital for her injectable medication. She was released from [facility] weeks ago. She stated she just wants her injectable medication. In the course of seeing her today, she was found to have multiple lighters, 2 crack pipes and a debit card hidden in her vagina. Pt will be given her Haldol injectable today and  discharged to follow up with [facility]. Pt is psychiatrically clear.   Demographic Factors:  Low socioeconomic status and Unemployed  Loss Factors: Financial problems/change in socioeconomic status  Historical Factors: Family history of mental illness or substance abuse  Risk Reduction Factors:   Sense of responsibility to family  Continued Clinical Symptoms:  Alcohol/Substance Abuse/Dependencies  Cognitive Features That Contribute To Risk:  Closed-mindedness    Suicide Risk:  Minimal: No identifiable suicidal ideation.  Patients presenting with no risk factors but with morbid ruminations; may be classified as minimal risk based on the severity of the depressive symptoms   Plan Of Care/Follow-up recommendations:  Activity:  as tolerated Diet:  Heart healthy  Laveda Abbe, NP 10/05/2018, 11:40 AM

## 2018-10-05 NOTE — Discharge Planning (Signed)
EDCM consulted in regards to medication assistance.  Pt has insurance coverage and is not eligible for medication assistance program.  No further CM needs communicated at this time.   

## 2018-10-05 NOTE — ED Notes (Signed)
Pt ambulated to Secure Unit without difficulty

## 2018-10-29 ENCOUNTER — Encounter (HOSPITAL_COMMUNITY): Payer: Self-pay

## 2018-10-29 ENCOUNTER — Inpatient Hospital Stay (HOSPITAL_COMMUNITY)
Admission: EM | Admit: 2018-10-29 | Discharge: 2018-11-15 | DRG: 291 | Disposition: A | Discharge status: Other Acute Inpatient Hospital | Payer: Medicare HMO | Attending: Family Medicine | Admitting: Family Medicine

## 2018-10-29 ENCOUNTER — Emergency Department (HOSPITAL_COMMUNITY): Payer: Medicare HMO

## 2018-10-29 ENCOUNTER — Other Ambulatory Visit: Payer: Self-pay

## 2018-10-29 DIAGNOSIS — J189 Pneumonia, unspecified organism: Secondary | ICD-10-CM

## 2018-10-29 DIAGNOSIS — E440 Moderate protein-calorie malnutrition: Secondary | ICD-10-CM | POA: Diagnosis present

## 2018-10-29 DIAGNOSIS — J850 Gangrene and necrosis of lung: Secondary | ICD-10-CM | POA: Diagnosis present

## 2018-10-29 DIAGNOSIS — F17210 Nicotine dependence, cigarettes, uncomplicated: Secondary | ICD-10-CM | POA: Diagnosis present

## 2018-10-29 DIAGNOSIS — F1210 Cannabis abuse, uncomplicated: Secondary | ICD-10-CM | POA: Diagnosis present

## 2018-10-29 DIAGNOSIS — I361 Nonrheumatic tricuspid (valve) insufficiency: Secondary | ICD-10-CM | POA: Diagnosis present

## 2018-10-29 DIAGNOSIS — Z79899 Other long term (current) drug therapy: Secondary | ICD-10-CM

## 2018-10-29 DIAGNOSIS — I509 Heart failure, unspecified: Secondary | ICD-10-CM

## 2018-10-29 DIAGNOSIS — I272 Pulmonary hypertension, unspecified: Secondary | ICD-10-CM | POA: Diagnosis present

## 2018-10-29 DIAGNOSIS — I5043 Acute on chronic combined systolic (congestive) and diastolic (congestive) heart failure: Secondary | ICD-10-CM

## 2018-10-29 DIAGNOSIS — I248 Other forms of acute ischemic heart disease: Secondary | ICD-10-CM | POA: Diagnosis present

## 2018-10-29 DIAGNOSIS — Z008 Encounter for other general examination: Secondary | ICD-10-CM

## 2018-10-29 DIAGNOSIS — Z515 Encounter for palliative care: Secondary | ICD-10-CM | POA: Diagnosis not present

## 2018-10-29 DIAGNOSIS — F419 Anxiety disorder, unspecified: Secondary | ICD-10-CM | POA: Diagnosis present

## 2018-10-29 DIAGNOSIS — Z970 Presence of artificial eye: Secondary | ICD-10-CM

## 2018-10-29 DIAGNOSIS — Z833 Family history of diabetes mellitus: Secondary | ICD-10-CM

## 2018-10-29 DIAGNOSIS — E0590 Thyrotoxicosis, unspecified without thyrotoxic crisis or storm: Secondary | ICD-10-CM | POA: Diagnosis present

## 2018-10-29 DIAGNOSIS — Z9114 Patient's other noncompliance with medication regimen: Secondary | ICD-10-CM

## 2018-10-29 DIAGNOSIS — I472 Ventricular tachycardia: Secondary | ICD-10-CM | POA: Diagnosis present

## 2018-10-29 DIAGNOSIS — F1414 Cocaine abuse with cocaine-induced mood disorder: Secondary | ICD-10-CM | POA: Diagnosis present

## 2018-10-29 DIAGNOSIS — F3113 Bipolar disorder, current episode manic without psychotic features, severe: Secondary | ICD-10-CM | POA: Diagnosis present

## 2018-10-29 DIAGNOSIS — Z716 Tobacco abuse counseling: Secondary | ICD-10-CM

## 2018-10-29 DIAGNOSIS — Z7401 Bed confinement status: Secondary | ICD-10-CM

## 2018-10-29 DIAGNOSIS — Z590 Homelessness: Secondary | ICD-10-CM

## 2018-10-29 DIAGNOSIS — Z1159 Encounter for screening for other viral diseases: Secondary | ICD-10-CM | POA: Diagnosis not present

## 2018-10-29 DIAGNOSIS — N183 Chronic kidney disease, stage 3 (moderate): Secondary | ICD-10-CM | POA: Diagnosis present

## 2018-10-29 DIAGNOSIS — I130 Hypertensive heart and chronic kidney disease with heart failure and stage 1 through stage 4 chronic kidney disease, or unspecified chronic kidney disease: Principal | ICD-10-CM | POA: Diagnosis present

## 2018-10-29 DIAGNOSIS — Z888 Allergy status to other drugs, medicaments and biological substances status: Secondary | ICD-10-CM

## 2018-10-29 DIAGNOSIS — F1910 Other psychoactive substance abuse, uncomplicated: Secondary | ICD-10-CM | POA: Diagnosis not present

## 2018-10-29 DIAGNOSIS — J449 Chronic obstructive pulmonary disease, unspecified: Secondary | ICD-10-CM | POA: Diagnosis present

## 2018-10-29 DIAGNOSIS — E871 Hypo-osmolality and hyponatremia: Secondary | ICD-10-CM | POA: Diagnosis present

## 2018-10-29 DIAGNOSIS — F250 Schizoaffective disorder, bipolar type: Secondary | ICD-10-CM | POA: Diagnosis present

## 2018-10-29 DIAGNOSIS — Z9119 Patient's noncompliance with other medical treatment and regimen: Secondary | ICD-10-CM

## 2018-10-29 DIAGNOSIS — N179 Acute kidney failure, unspecified: Secondary | ICD-10-CM | POA: Diagnosis not present

## 2018-10-29 DIAGNOSIS — Z7982 Long term (current) use of aspirin: Secondary | ICD-10-CM

## 2018-10-29 DIAGNOSIS — R570 Cardiogenic shock: Secondary | ICD-10-CM | POA: Diagnosis not present

## 2018-10-29 DIAGNOSIS — R000 Tachycardia, unspecified: Secondary | ICD-10-CM | POA: Diagnosis not present

## 2018-10-29 DIAGNOSIS — F1494 Cocaine use, unspecified with cocaine-induced mood disorder: Secondary | ICD-10-CM

## 2018-10-29 DIAGNOSIS — J984 Other disorders of lung: Secondary | ICD-10-CM | POA: Diagnosis not present

## 2018-10-29 DIAGNOSIS — I2699 Other pulmonary embolism without acute cor pulmonale: Secondary | ICD-10-CM | POA: Diagnosis present

## 2018-10-29 DIAGNOSIS — Z6823 Body mass index (BMI) 23.0-23.9, adult: Secondary | ICD-10-CM

## 2018-10-29 DIAGNOSIS — Z66 Do not resuscitate: Secondary | ICD-10-CM | POA: Diagnosis not present

## 2018-10-29 DIAGNOSIS — I420 Dilated cardiomyopathy: Secondary | ICD-10-CM | POA: Diagnosis not present

## 2018-10-29 DIAGNOSIS — Z881 Allergy status to other antibiotic agents status: Secondary | ICD-10-CM

## 2018-10-29 DIAGNOSIS — I5082 Biventricular heart failure: Secondary | ICD-10-CM | POA: Diagnosis present

## 2018-10-29 DIAGNOSIS — Z8249 Family history of ischemic heart disease and other diseases of the circulatory system: Secondary | ICD-10-CM

## 2018-10-29 DIAGNOSIS — E875 Hyperkalemia: Secondary | ICD-10-CM | POA: Diagnosis not present

## 2018-10-29 DIAGNOSIS — Z7151 Drug abuse counseling and surveillance of drug abuser: Secondary | ICD-10-CM

## 2018-10-29 DIAGNOSIS — Z5320 Procedure and treatment not carried out because of patient's decision for unspecified reasons: Secondary | ICD-10-CM | POA: Diagnosis not present

## 2018-10-29 DIAGNOSIS — R042 Hemoptysis: Secondary | ICD-10-CM | POA: Diagnosis not present

## 2018-10-29 DIAGNOSIS — I428 Other cardiomyopathies: Secondary | ICD-10-CM | POA: Diagnosis present

## 2018-10-29 DIAGNOSIS — I959 Hypotension, unspecified: Secondary | ICD-10-CM | POA: Diagnosis not present

## 2018-10-29 LAB — SARS CORONAVIRUS 2 BY RT PCR (HOSPITAL ORDER, PERFORMED IN ~~LOC~~ HOSPITAL LAB): SARS Coronavirus 2: NEGATIVE

## 2018-10-29 LAB — RAPID URINE DRUG SCREEN, HOSP PERFORMED
Amphetamines: NOT DETECTED
Barbiturates: NOT DETECTED
Benzodiazepines: NOT DETECTED
Cocaine: POSITIVE — AB
Opiates: NOT DETECTED
Tetrahydrocannabinol: NOT DETECTED

## 2018-10-29 LAB — BASIC METABOLIC PANEL
Anion gap: 12 (ref 5–15)
BUN: 24 mg/dL — ABNORMAL HIGH (ref 6–20)
CO2: 18 mmol/L — ABNORMAL LOW (ref 22–32)
Calcium: 8.8 mg/dL — ABNORMAL LOW (ref 8.9–10.3)
Chloride: 106 mmol/L (ref 98–111)
Creatinine, Ser: 1.39 mg/dL — ABNORMAL HIGH (ref 0.44–1.00)
GFR calc Af Amer: 55 mL/min — ABNORMAL LOW (ref >60)
GFR calc non Af Amer: 47 mL/min — ABNORMAL LOW (ref >60)
Glucose, Bld: 150 mg/dL — ABNORMAL HIGH (ref 70–99)
Potassium: 4.5 mmol/L (ref 3.5–5.1)
Sodium: 136 mmol/L (ref 135–145)

## 2018-10-29 LAB — CBC WITH DIFFERENTIAL/PLATELET
Abs Immature Granulocytes: 0.13 10*3/uL — ABNORMAL HIGH (ref 0.00–0.07)
Basophils Absolute: 0.1 10*3/uL (ref 0.0–0.1)
Basophils Relative: 1 %
Eosinophils Absolute: 0 10*3/uL (ref 0.0–0.5)
Eosinophils Relative: 0 %
HCT: 41.1 % (ref 36.0–46.0)
Hemoglobin: 12.8 g/dL (ref 12.0–15.0)
Immature Granulocytes: 1 %
Lymphocytes Relative: 14 %
Lymphs Abs: 1.5 10*3/uL (ref 0.7–4.0)
MCH: 24.7 pg — ABNORMAL LOW (ref 26.0–34.0)
MCHC: 31.1 g/dL (ref 30.0–36.0)
MCV: 79.2 fL — ABNORMAL LOW (ref 80.0–100.0)
Monocytes Absolute: 0.8 10*3/uL (ref 0.1–1.0)
Monocytes Relative: 7 %
Neutro Abs: 7.9 10*3/uL — ABNORMAL HIGH (ref 1.7–7.7)
Neutrophils Relative %: 77 %
Platelets: 216 10*3/uL (ref 150–400)
RBC: 5.19 MIL/uL — ABNORMAL HIGH (ref 3.87–5.11)
RDW: 17.9 % — ABNORMAL HIGH (ref 11.5–15.5)
WBC: 10.3 10*3/uL (ref 4.0–10.5)
nRBC: 0.6 % — ABNORMAL HIGH (ref 0.0–0.2)

## 2018-10-29 LAB — TSH: TSH: 0.646 u[IU]/mL (ref 0.350–4.500)

## 2018-10-29 LAB — BRAIN NATRIURETIC PEPTIDE: B Natriuretic Peptide: 2729.9 pg/mL — ABNORMAL HIGH (ref 0.0–100.0)

## 2018-10-29 LAB — T4, FREE: Free T4: 1.5 ng/dL (ref 0.82–1.77)

## 2018-10-29 LAB — TROPONIN I
Troponin I: 0.23 ng/mL (ref <0.03)
Troponin I: 0.32 ng/mL (ref <0.03)

## 2018-10-29 MED ORDER — ISOSORB DINITRATE-HYDRALAZINE 20-37.5 MG PO TABS
0.5000 | ORAL_TABLET | Freq: Three times a day (TID) | ORAL | Status: DC
  Administered 2018-10-29 – 2018-10-30 (×3): 0.5 via ORAL
  Filled 2018-10-29 (×3): qty 1

## 2018-10-29 MED ORDER — FUROSEMIDE 10 MG/ML IJ SOLN
80.0000 mg | Freq: Once | INTRAMUSCULAR | Status: AC
  Administered 2018-10-29: 80 mg via INTRAVENOUS
  Filled 2018-10-29: qty 8

## 2018-10-29 MED ORDER — NICOTINE 7 MG/24HR TD PT24
7.0000 mg | MEDICATED_PATCH | Freq: Every day | TRANSDERMAL | Status: DC
  Administered 2018-10-29 – 2018-11-15 (×17): 7 mg via TRANSDERMAL
  Filled 2018-10-29 (×18): qty 1

## 2018-10-29 MED ORDER — ENOXAPARIN SODIUM 40 MG/0.4ML ~~LOC~~ SOLN
40.0000 mg | SUBCUTANEOUS | Status: DC
  Administered 2018-10-29: 40 mg via SUBCUTANEOUS
  Filled 2018-10-29 (×2): qty 0.4

## 2018-10-29 MED ORDER — FUROSEMIDE 10 MG/ML IJ SOLN
40.0000 mg | Freq: Three times a day (TID) | INTRAMUSCULAR | Status: DC
  Administered 2018-10-30 – 2018-10-31 (×5): 40 mg via INTRAVENOUS
  Filled 2018-10-29 (×5): qty 4

## 2018-10-29 MED ORDER — FUROSEMIDE 10 MG/ML IJ SOLN
40.0000 mg | Freq: Once | INTRAMUSCULAR | Status: AC
  Administered 2018-10-29: 40 mg via INTRAVENOUS
  Filled 2018-10-29: qty 4

## 2018-10-29 NOTE — ED Triage Notes (Signed)
Pt here for evaluation of shob x 4 days, ran out of Lasix two days ago. Edema to bilateral LE, abdomen, and face.

## 2018-10-29 NOTE — Progress Notes (Signed)
Patient refused blood draw for troponin, says she's not giving anymore blood until the morning.

## 2018-10-29 NOTE — ED Notes (Signed)
Patient transported to X-ray 

## 2018-10-29 NOTE — Consult Note (Addendum)
CARDIOLOGY CONSULT NOTE  Patient ID: Christine Cobb MRN: [id number] DOB/AGE: [birth year] 42 y.o.  Admit date: 10/29/2018 Referring Physician: Internal medicine teaching service Primary Physician:  Patient, No Pcp Per Reason for Consultation: Heart failure exacerbation  HPI:   43 y.o. African-American female  with polysubstance abuse, hypertension, history of hyperthyroidism, bipolar disorder, schizophrenia, known systolic biventricular heart failure, previously seen on consult service.  [facility] heart care in 07/2018 for acute heart failure exacerbation.  Patient was deemed not to be a candidate for advanced heart failure therapies due to substance abuse and baseline psychiatric history.  Patient did not establish outpatient care with [facility] heart care, and now admitted to the hospital again with worsening shortness of breath, orthopnea.  Patient has not been compliant with any of her medical therapy, and unfortunately has been homeless.  Today, patient is sleeping, arousable, but refuses to talk to me. History is limited to chart review and discussion with the primary team.  Past Medical History:  Diagnosis Date  . Arm pain   . Bipolar affective disorder, currently manic, mild (HCC)   . CHF (congestive heart failure) (HCC)   . Eye globe prosthesis   . HTN (hypertension)   . Hyperthyroidism   . Sinus tachycardia      Past Surgical History:  Procedure Laterality Date  . DILATION AND CURETTAGE OF UTERUS    . EYE SURGERY       Family History  Problem Relation Age of Onset  . Hypertension Other   . Emphysema Other   . Asthma Son   . Diabetes Maternal Uncle   . Diabetes Paternal Grandmother      Social History: Social History   Socioeconomic History  . Marital status: Married    Spouse name: Not on file  . Number of children: Not on file  . Years of education: Not on file  . Highest education level: Not on file  Occupational History  . Not on file  Social Needs  .  Financial resource strain: Not on file  . Food insecurity:    Worry: Not on file    Inability: Not on file  . Transportation needs:    Medical: Not on file    Non-medical: Not on file  Tobacco Use  . Smoking status: Current Every Day Smoker    Types: Cigarettes    Last attempt to quit: 08/21/2013    Years since quitting: 5.1  . Smokeless tobacco: Never Used  Substance and Sexual Activity  . Alcohol use: Yes    Comment: Last drink: 1/2 beer PTA  . Drug use: Yes    Types: Marijuana    Comment: Pt sts "anything and everything"  . Sexual activity: Yes    Birth control/protection: Injection    Comment: crack cocaine  Lifestyle  . Physical activity:    Days per week: Not on file    Minutes per session: Not on file  . Stress: Not on file  Relationships  . Social connections:    Talks on phone: Not on file    Gets together: Not on file    Attends religious service: Not on file    Active member of club or organization: Not on file    Attends meetings of clubs or organizations: Not on file    Relationship status: Not on file  . Intimate partner violence:    Fear of current or ex partner: Not on file    Emotionally abused: Not on file    Physically  abused: Not on file    Forced sexual activity: Not on file  Other Topics Concern  . Not on file  Social History Narrative  . Not on file     Medications Prior to Admission  Medication Sig Dispense Refill Last Dose  . albuterol (PROVENTIL HFA;VENTOLIN HFA) 108 (90 Base) MCG/ACT inhaler Inhale 2 puffs into the lungs every 4 (four) hours as needed for wheezing or shortness of breath. (Patient not taking: Reported on 10/29/2018) 1 Inhaler 3 Not Taking at Unknown time  . digoxin (LANOXIN) 0.125 MG tablet Take 1 tablet (0.125 mg total) by mouth daily. (Patient not taking: Reported on 10/29/2018) 30 tablet 0 Not Taking at Unknown time  . furosemide (LASIX) 20 MG tablet Take 1 tablet (20 mg total) by mouth daily as needed for fluid or edema.  (Patient not taking: Reported on 10/29/2018) 30 tablet 0 Not Taking at Unknown time    Review of Systems  Unable to perform ROS: psychiatric disorder      Physical Exam: Physical Exam  Constitutional: She appears well-developed and well-nourished. No distress.  HENT:  Head: Normocephalic and atraumatic.  Eyes:  Could not be assessed  Neck: JVD present.  Cardiovascular: Regular rhythm and intact distal pulses. Tachycardia present. Exam reveals gallop.  No murmur heard. Pulmonary/Chest: Effort normal and breath sounds normal. No respiratory distress. She has no wheezes. She has no rales.  Abdominal: Soft. Bowel sounds are normal. There is no rebound.  Musculoskeletal:        General: Edema (3+ n/l) present.  Lymphadenopathy:    She has no cervical adenopathy.  Neurological: She is alert. No cranial nerve deficit.  Arousable, but refuses to talk to me  Skin: Skin is warm and dry.  Psychiatric:  Not assessed   Nursing note and vitals reviewed.    Labs:   Lab Results  Component Value Date   WBC 10.3 10/29/2018   HGB 12.8 10/29/2018   HCT 41.1 10/29/2018   MCV 79.2 (L) 10/29/2018   PLT 216 10/29/2018    Recent Labs  Lab 10/29/18 1235  NA 136  K 4.5  CL 106  CO2 18*  BUN 24*  CREATININE 1.39*  CALCIUM 8.8*  GLUCOSE 150*    Lipid Panel     Component Value Date/Time   CHOL 121 12/21/2014 0700   TRIG 66 12/21/2014 0700   HDL 53 12/21/2014 0700   CHOLHDL 2.3 12/21/2014 0700   VLDL 13 12/21/2014 0700   LDLCALC 55 12/21/2014 0700    BNP (last 3 results) Recent Labs    08/13/18 0358 10/05/18 0724 10/29/18 1235  BNP 1,153.1* 789.4* 2,729.9*    HEMOGLOBIN A1C Lab Results  Component Value Date   HGBA1C 5.8 (H) 08/09/2018   MPG 119.76 08/09/2018    Cardiac Panel (last 3 results) Recent Labs    08/10/18 0303 10/29/18 1235 10/29/18 1607  TROPONINI 0.15* 0.23* 0.32*    Lab Results  Component Value Date   CKTOTAL 1,334 (H) 05/17/2013   TROPONINI  0.32 (HH) 10/29/2018     TSH Recent Labs    08/09/18 1650 10/29/18 1607  TSH 0.492 0.646      Radiology: Dg Chest 2 View  Result Date: 10/29/2018 CLINICAL DATA:  Shortness of breath for 4 days EXAM: CHEST - 2 VIEW COMPARISON:  10/05/2018 FINDINGS: Chronic cardiomegaly. Small pleural effusions with lower lobe opacity greater on the right. A similar appearance was seen previously. There is a right upper lobe opacity outlining the minor  fissure, convincing even when accounting for an overlapping EKG lead. No pneumothorax. IMPRESSION: 1. Right upper lobe opacity that is focal and suspicious for pneumonia. 2. Cardiomegaly with right more than left pleural effusion and lower lobe atelectasis/opacification. Electronically Signed   By: Marnee Spring M.D.   On: 10/29/2018 13:52    Scheduled Meds: . enoxaparin (LOVENOX) injection  40 mg Subcutaneous Q24H  . furosemide  40 mg Intravenous Once   Followed by  . [START ON 10/30/2018] furosemide  40 mg Intravenous Q8H  . nicotine  7 mg Transdermal Daily   Continuous Infusions: PRN Meds:.  CARDIAC STUDIES:  EKG 10/29/2018: Sinus tachycardia 138 bpm.  Left atrial enlargement.  Old anteroseptal infarct.  Ventricular trigeminy.  Nonspecific ST-T changes.  Echocardiogram 08/10/2018: Mild LV dilatation.  EF 20-25%.  Global diffuse hypokinesis.  Restrictive physiology. Mild RV dilatation.  Moderate RV systolic dysfunction.  Severe left atrial dilatation.  Mild right atrial dilatation. Severe tricuspid regurgitation. Severe pulmonary hypertension RVSP 66 mmHg. Trivial pericardial effusion   Assessment & Recommendations:  43 y.o. African-American female  with polysubstance abuse, hypertension, history of hyperthyroidism, bipolar disorder, schizophrenia, known systolic biventricular heart failure, severe pulmonary hypertension, admitted with acute on chronic systolic heart failure.  Acute on chronic systolic and diastolic, biventricular heart  failure: Etiology of cardiomyopathy could be cocaine induced, hypertensive, or ischemic. Recommend aggressive diuresis with IV Lasix 40 mg 3 times daily. Recommend low dose Bidil for afterload reduction. Her tachycardia and ventricular trigeminy is secondary to heart failure decompensation. Perceived bradycardia on automated monitoring is due to PVC's.  Avoid beta blocker at this stage, given decompensated heart failure, as well as cocaine abue. Recommend daily weights and strict intake/output. 2 g sodium diet.  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  Recommend diuresis for now.  Hold off ACE/ARB/ARNI/aldosterone antagonist for now.  Troponin elevation: Likely type II MI due to heart failure exacerbation. She needs invasive coronary evaluation to rule out ischemic cardiomyopathy.  However, she has refused to undergo coronary angiography in the past.  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  Continue management as above.  Bipolar disorder, schizophrenia, polysubstance abuse, homelessness: These remain significant barriers for her care and likely precipitating her medication and diet noncompliance.  She will need assistance from Technical sales engineer. She remains at high risk for readmission.  Recommendations discussed with the primary team.   Elder Negus, MD 10/29/2018, 6:36 PM [facility] [place] Pager: [phone] Office: [phone] If no answer Cell [phone]

## 2018-10-29 NOTE — ED Provider Notes (Signed)
[facility] EMERGENCY DEPARTMENT Provider Note   CSN: [id number] Arrival date & time: 10/29/18  1200    History   Chief Complaint Chief Complaint  Patient presents with  . Shortness of Breath    HPI Christine Cobb is a 43 y.o. female.     HPI Patient presents to the emergency department with increasing swelling in her legs and abdomen along with her upper extremities as well.  The patient states she has been out of her Lasix for several days.  She states that she started getting worse over the last 24 hours.  Patient states that exertion makes her shortness of breath worse.  Patient states that nothing seems make the condition better.  The patient denies chest pain,headache,blurred vision, neck pain, fever, cough, weakness, numbness, dizziness, anorexia,  abdominal pain, nausea, vomiting, diarrhea, rash, back pain, dysuria, hematemesis, bloody stool, near syncope, or syncope. Past Medical History:  Diagnosis Date  . Arm pain   . Bipolar affective disorder, currently manic, mild (HCC)   . CHF (congestive heart failure) (HCC)   . Eye globe prosthesis   . HTN (hypertension)   . Hyperthyroidism   . Sinus tachycardia     Patient Active Problem List   Diagnosis Date Noted  . Malnutrition of moderate degree 08/11/2018  . Hemoptysis 08/09/2018  . Respiratory failure (HCC)   . Chest pain   . Community acquired pneumonia of right lower lobe of lung (HCC)   . Bipolar affective disorder, manic, severe (HCC) 04/30/2017  . Severe bipolar affective disorder with psychosis (HCC) 04/29/2017  . Cocaine abuse with cocaine-induced mood disorder (HCC) 06/02/2016  . Schizoaffective disorder, bipolar type (HCC)   . Bipolar disorder, current episode manic without psychotic features, severe (HCC)   . Agitation   . Manic behavior (HCC)   . Hyperthyroidism 09/21/2013  . Marijuana abuse 04/12/2013  . History of CHF (congestive heart failure) 02/01/2013  . Abscess of  abdominal wall 10/18/2012  . HTN (hypertension) 04/08/2011  . Tachycardia 04/08/2011    Past Surgical History:  Procedure Laterality Date  . DILATION AND CURETTAGE OF UTERUS    . EYE SURGERY       OB History    Gravida  3   Para  2   Term  2   Preterm      AB  1   Living  2     SAB  1   TAB  0   Ectopic      Multiple      Live Births  2            Home Medications    Prior to Admission medications   Medication Sig Start Date End Date Taking? Authorizing Provider  albuterol (PROVENTIL HFA;VENTOLIN HFA) 108 (90 Base) MCG/ACT inhaler Inhale 2 puffs into the lungs every 4 (four) hours as needed for wheezing or shortness of breath. Patient not taking: Reported on 10/05/2018 08/09/18   Ivonne Andrew, NP  albuterol (PROVENTIL) (2.5 MG/3ML) 0.083% nebulizer solution Take 3 mLs (2.5 mg total) by nebulization every 6 (six) hours as needed for wheezing or shortness of breath. Patient not taking: Reported on 10/05/2018 08/01/18   Tomma Lightning, MD  amantadine (SYMMETREL) 100 MG capsule Take 1 capsule (100 mg total) 2 (two) times daily by mouth. Patient not taking: Reported on 08/24/2018 05/09/17   Oneta Rack, NP  ARIPiprazole (ABILIFY) 10 MG tablet Take 1 tablet (10 mg total) daily by mouth. Patient  not taking: Reported on 10/05/2018 05/10/17   Oneta Rack, NP  Ascorbic Acid (VITAMIN C PO) Take 1 tablet by mouth daily.    [provider]  aspirin 81 MG chewable tablet Chew 81 mg by mouth daily.    [provider]  digoxin (LANOXIN) 0.125 MG tablet Take 1 tablet (0.125 mg total) by mouth daily. Patient not taking: Reported on 08/24/2018 08/18/18   Leatha Gilding, MD  diphenhydrAMINE (BENADRYL) 25 MG tablet Take 25 mg by mouth at bedtime as needed for sleep.    [provider]  furosemide (LASIX) 20 MG tablet Take 1 tablet (20 mg total) by mouth daily as needed for fluid or edema. 10/05/18   Raeford Razor, MD  furosemide (LASIX) 20 MG  tablet Take 1 tablet (20 mg total) by mouth daily as needed for fluid or edema. 10/05/18   Laveda Abbe, NP  mirtazapine (REMERON) 15 MG tablet Take 1 tablet (15 mg total) at bedtime by mouth. Patient not taking: Reported on 08/24/2018 05/09/17   Oneta Rack, NP  nicotine polacrilex (NICORETTE) 2 MG gum Take 1 each (2 mg total) by mouth as needed for smoking cessation. Patient not taking: Reported on 08/24/2018 08/09/18   Tomma Lightning, MD  VITAMIN D PO Take 1 tablet by mouth daily.    [provider]    Family History Family History  Problem Relation Age of Onset  . Hypertension Other   . Emphysema Other   . Asthma Son   . Diabetes Maternal Uncle   . Diabetes Paternal Grandmother     Social History Social History   Tobacco Use  . Smoking status: Current Every Day Smoker    Types: Cigarettes    Last attempt to quit: 08/21/2013    Years since quitting: 5.1  . Smokeless tobacco: Never Used  Substance Use Topics  . Alcohol use: Yes    Comment: Last drink: 1/2 beer PTA  . Drug use: Yes    Types: Marijuana    Comment: Pt sts "anything and everything"     Allergies   Amoxil [amoxicillin]; Hydroxyzine; Ketorolac tromethamine; Prenatal [b-plex plus]; Tegretol [carbamazepine]; B-plex plus; and Tegretol [carbamazepine]   Review of Systems Review of Systems  All other systems negative except as documented in the HPI. All pertinent positives and negatives as reviewed in the HPI. Physical Exam Updated Vital Signs BP (!) 124/98 (BP Location: Right Arm)   Pulse (!) 47   Temp 98.1 F (36.7 C) (Oral)   Resp (!) 28   Ht 5\' 4"  (1.626 m)   Wt 61.2 kg   LMP 09/13/2018 (Approximate)   SpO2 100%   BMI 23.17 kg/m   Physical Exam Vitals signs and nursing note reviewed.  Constitutional:      General: She is not in acute distress.    Appearance: She is well-developed.  HENT:     Head: Normocephalic and atraumatic.  Eyes:     Pupils: Pupils are equal, round,  and reactive to light.  Neck:     Musculoskeletal: Normal range of motion and neck supple.  Cardiovascular:     Rate and Rhythm: Normal rate and regular rhythm.     Heart sounds: Normal heart sounds. No murmur. No friction rub. No gallop.   Pulmonary:     Effort: Pulmonary effort is normal. No respiratory distress.     Breath sounds: Examination of the right-middle field reveals rales. Examination of the left-middle field reveals rales. Examination of  the right-lower field reveals rales. Examination of the left-lower field reveals rales. Rales present. No wheezing.  Abdominal:     General: Bowel sounds are normal. There is no distension.     Palpations: Abdomen is soft.     Tenderness: There is no abdominal tenderness.     Comments: Edema noted   Musculoskeletal:     Right lower leg: Edema present.     Left lower leg: Edema present.  Skin:    General: Skin is warm and dry.     Capillary Refill: Capillary refill takes less than 2 seconds.     Findings: No erythema or rash.  Neurological:     Mental Status: She is alert and oriented to person, place, and time.     Motor: No abnormal muscle tone.     Coordination: Coordination normal.  Psychiatric:        Behavior: Behavior normal.      ED Treatments / Results  Labs (all labs ordered are listed, but only abnormal results are displayed) Labs Reviewed  BASIC METABOLIC PANEL - Abnormal; Notable for the following components:      Result Value   CO2 18 (*)    Glucose, Bld 150 (*)    BUN 24 (*)    Creatinine, Ser 1.39 (*)    Calcium 8.8 (*)    GFR calc non Af Amer 47 (*)    GFR calc Af Amer 55 (*)    All other components within normal limits  CBC WITH DIFFERENTIAL/PLATELET - Abnormal; Notable for the following components:   RBC 5.19 (*)    MCV 79.2 (*)    MCH 24.7 (*)    RDW 17.9 (*)    nRBC 0.6 (*)    Neutro Abs 7.9 (*)    Abs Immature Granulocytes 0.13 (*)    All other components within normal limits  BRAIN NATRIURETIC  PEPTIDE - Abnormal; Notable for the following components:   B Natriuretic Peptide 2,729.9 (*)    All other components within normal limits  TROPONIN I - Abnormal; Notable for the following components:   Troponin I 0.23 (*)    All other components within normal limits  SARS CORONAVIRUS 2 (HOSPITAL ORDER, PERFORMED IN [facility] LAB)    EKG EKG Interpretation  Date/Time:  Sunday Oct 29 2018 12:07:36 EDT Ventricular Rate:  138 PR Interval:    QRS Duration: 60 QT Interval:  303 QTC Calculation: 460 R Axis:   68 Text Interpretation:  Sinus tachycardia Ventricular trigeminy Probable left atrial enlargement Low voltage with right axis deviation Anteroseptal infarct, old No acute changes Nonspecific ST and T wave abnormality No significant change since last tracing Confirmed by Derwood Kaplan [phone]) on 10/29/2018 1:17:15 PM   Radiology Dg Chest 2 View  Result Date: 10/29/2018 CLINICAL DATA:  Shortness of breath for 4 days EXAM: CHEST - 2 VIEW COMPARISON:  10/05/2018 FINDINGS: Chronic cardiomegaly. Small pleural effusions with lower lobe opacity greater on the right. A similar appearance was seen previously. There is a right upper lobe opacity outlining the minor fissure, convincing even when accounting for an overlapping EKG lead. No pneumothorax. IMPRESSION: 1. Right upper lobe opacity that is focal and suspicious for pneumonia. 2. Cardiomegaly with right more than left pleural effusion and lower lobe atelectasis/opacification. Electronically Signed   By: Marnee Spring M.D.   On: 10/29/2018 13:52    Procedures Procedures (including critical care time)  Medications Ordered in ED Medications  furosemide (LASIX) injection 80 mg (  80 mg Intravenous Given 10/29/18 1335)     Initial Impression / Assessment and Plan / ED Course  I have reviewed the triage vital signs and the nursing notes.  Pertinent labs & imaging results that were available during my care of the patient were  reviewed by me and considered in my medical decision making (see chart for details).  Clinical Course as of Oct 29 1426  Sun Oct 29, 2018  1413 Clinical concern for pneumonia is extremely low.  Patient has no fever or elevated white count and clinical evaluation is consistent with CHF.  Patient is not septic and will not be started on antibiotics.  DG Chest 2 View [AN]    Clinical Course User Index [AN] Derwood KaplanNanavati, Ankit, MD       Patient need admission for diuresis and further maintenance of her CHF exacerbation.  Will speak with unassigned medicine for admission.  Patient has been otherwise stable except for tachycardia.   Final Clinical Impressions(s) / ED Diagnoses   Final diagnoses:  None    ED Discharge Orders    None       Charlestine NightLawyer, Melinna Linarez, PA-C 10/29/18 1450    Derwood KaplanNanavati, Ankit, MD 10/30/18 [phone]

## 2018-10-29 NOTE — Plan of Care (Signed)
  Problem: Education: Goal: Ability to demonstrate management of disease process will improve Outcome: Progressing   Problem: Education: Goal: Ability to verbalize understanding of medication therapies will improve Outcome: Progressing   

## 2018-10-29 NOTE — ED Notes (Signed)
ED TO INPATIENT HANDOFF REPORT  ED Nurse Name and Phone #: Maralyn Sago [phone]  S Name/Age/Gender Collier Flowers Reznik 43 y.o. female Room/Bed: 018C/018C  Code Status   Code Status: Full Code  Home/SNF/Other Home Patient oriented to: self, place, time and situation Is this baseline? Yes   Triage Complete: Triage complete  Chief Complaint SOB  Triage Note Pt here for evaluation of shob x 4 days, ran out of Lasix two days ago. Edema to bilateral LE, abdomen, and face.    Allergies Allergies  Allergen Reactions  . Amoxil [Amoxicillin] Anaphylaxis    Did it involve swelling of the face/tongue/throat, SOB, or low BP? yes Did it involve sudden or severe rash/hives, skin peeling, or any reaction on the inside of your mouth or nose? yes Did you need to seek medical attention at a hospital or doctor's office? unknown When did it last happen?unknoen If all above answers are "NO", may proceed with cephalosporin use.   Marland Kitchen Hydroxyzine     Other reaction(s): Bleeding  . Ketorolac Tromethamine Hives and Swelling  . Prenatal [B-Plex Plus] Nausea Only  . Tegretol [Carbamazepine] Other (See Comments)    Swelling, skin peeling, skin discoloration  . B-Plex Plus Nausea Only    Prenatal   . Tegretol [Carbamazepine] Swelling and Other (See Comments)    Skin peeling Skin discoloring     Level of Care/Admitting Diagnosis ED Disposition    ED Disposition Condition Comment   Admit  Hospital Area: [facility] [[id number]]  Level of Care: Telemetry Cardiac [103]  Covid Evaluation: N/A  Diagnosis: CHF exacerbation [facility]) [[id number]]  Admitting Physician: Lennox Solders [[id number]]  Attending Physician: Gwendolyn Grant, JEFFREY H [[id number]]  Estimated length of stay: past midnight tomorrow  Certification:: I certify this patient will need inpatient services for at least 2 midnights  PT Class (Do Not Modify): Inpatient [101]  PT Acc Code (Do Not Modify): Private [1]        B Medical/Surgery History Past Medical History:  Diagnosis Date  . Arm pain   . Bipolar affective disorder, currently manic, mild (HCC)   . CHF (congestive heart failure) (HCC)   . Eye globe prosthesis   . HTN (hypertension)   . Hyperthyroidism   . Sinus tachycardia    Past Surgical History:  Procedure Laterality Date  . DILATION AND CURETTAGE OF UTERUS    . EYE SURGERY       A IV Location/Drains/Wounds Patient Lines/Drains/Airways Status   Active Line/Drains/Airways    Name:   Placement date:   Placement time:   Site:   Days:   Peripheral IV 10/29/18 Left Antecubital   10/29/18    1202    Antecubital   less than 1   Peripheral IV 10/29/18 Right Antecubital   10/29/18    1215    Antecubital   less than 1          Intake/Output Last 24 hours  Intake/Output Summary (Last 24 hours) at 10/29/2018 1558 Last data filed at 10/29/2018 1421 Gross per 24 hour  Intake -  Output 150 ml  Net -150 ml    Labs/Imaging Results for orders placed or performed during the hospital encounter of 10/29/18 (from the past 48 hour(s))  Basic metabolic panel     Status: Abnormal   Collection Time: 10/29/18 12:35 PM  Result Value Ref Range   Sodium 136 135 - 145 mmol/L   Potassium 4.5 3.5 - 5.1 mmol/L   Chloride 106 98 - 111  mmol/L   CO2 18 (L) 22 - 32 mmol/L   Glucose, Bld 150 (H) 70 - 99 mg/dL   BUN 24 (H) 6 - 20 mg/dL   Creatinine, Ser 8.46 (H) 0.44 - 1.00 mg/dL   Calcium 8.8 (L) 8.9 - 10.3 mg/dL   GFR calc non Af Amer 47 (L) >60 mL/min   GFR calc Af Amer 55 (L) >60 mL/min   Anion gap 12 5 - 15    Comment: Performed at [facility], [address], [place], [place] [address]  CBC with Differential     Status: Abnormal   Collection Time: 10/29/18 12:35 PM  Result Value Ref Range   WBC 10.3 4.0 - 10.5 K/uL   RBC 5.19 (H) 3.87 - 5.11 MIL/uL   Hemoglobin 12.8 12.0 - 15.0 g/dL   HCT 57.0 17.7 - 93.9 %   MCV 79.2 (L) 80.0 - 100.0 fL   MCH 24.7 (L) 26.0 - 34.0 pg   MCHC 31.1  30.0 - 36.0 g/dL   RDW 03.0 (H) 09.2 - 33.0 %   Platelets 216 150 - 400 K/uL   nRBC 0.6 (H) 0.0 - 0.2 %   Neutrophils Relative % 77 %   Neutro Abs 7.9 (H) 1.7 - 7.7 K/uL   Lymphocytes Relative 14 %   Lymphs Abs 1.5 0.7 - 4.0 K/uL   Monocytes Relative 7 %   Monocytes Absolute 0.8 0.1 - 1.0 K/uL   Eosinophils Relative 0 %   Eosinophils Absolute 0.0 0.0 - 0.5 K/uL   Basophils Relative 1 %   Basophils Absolute 0.1 0.0 - 0.1 K/uL   Immature Granulocytes 1 %   Abs Immature Granulocytes 0.13 (H) 0.00 - 0.07 K/uL    Comment: Performed at [facility], [address], [place], [place] [address]  Brain natriuretic peptide     Status: Abnormal   Collection Time: 10/29/18 12:35 PM  Result Value Ref Range   B Natriuretic Peptide 2,729.9 (H) 0.0 - 100.0 pg/mL    Comment: Performed at [facility], [address] [place], [place] [address]  Troponin I - Once     Status: Abnormal   Collection Time: 10/29/18 12:35 PM  Result Value Ref Range   Troponin I 0.23 (HH) <0.03 ng/mL    Comment: CRITICAL RESULT CALLED TO, READ BACK BY AND VERIFIED WITH: E.BRETRAND,RN @ 1330 10/29/2018 WEBBERJ Performed at [facility], [address] [place], [place] [address]   SARS Coronavirus 2 (CEPHEID- Performed in [facility] hospital lab), Hosp Order     Status: None   Collection Time: 10/29/18  2:22 PM  Result Value Ref Range   SARS Coronavirus 2 NEGATIVE NEGATIVE    Comment: (NOTE) If result is NEGATIVE SARS-CoV-2 target nucleic acids are NOT DETECTED. The SARS-CoV-2 RNA is generally detectable in upper and lower  respiratory specimens during the acute phase of infection. The lowest  concentration of SARS-CoV-2 viral copies this assay can detect is 250  copies / mL. A negative result does not preclude SARS-CoV-2 infection  and should not be used as the sole basis for treatment or other  patient management decisions.  A negative result may occur with  improper specimen collection /  handling, submission of specimen other  than nasopharyngeal swab, presence of viral mutation(s) within the  areas targeted by this assay, and inadequate number of viral copies  (<250 copies / mL). A negative result must be combined with clinical  observations,  patient history, and epidemiological information. If result is POSITIVE SARS-CoV-2 target nucleic acids are DETECTED. The SARS-CoV-2 RNA is generally detectable in upper and lower  respiratory specimens dur ing the acute phase of infection.  Positive  results are indicative of active infection with SARS-CoV-2.  Clinical  correlation with patient history and other diagnostic information is  necessary to determine patient infection status.  Positive results do  not rule out bacterial infection or co-infection with other viruses. If result is PRESUMPTIVE POSTIVE SARS-CoV-2 nucleic acids MAY BE PRESENT.   A presumptive positive result was obtained on the submitted specimen  and confirmed on repeat testing.  While 2019 novel coronavirus  (SARS-CoV-2) nucleic acids may be present in the submitted sample  additional confirmatory testing may be necessary for epidemiological  and / or clinical management purposes  to differentiate between  SARS-CoV-2 and other Sarbecovirus currently known to infect humans.  If clinically indicated additional testing with an alternate test  methodology [phone]) is advised. The SARS-CoV-2 RNA is generally  detectable in upper and lower respiratory sp ecimens during the acute  phase of infection. The expected result is Negative. Fact Sheet for Patients:  BoilerBrush.com.cyhttps://www.fda.gov/media/136312/download Fact Sheet for Healthcare Providers: https://pope.com/https://www.fda.gov/media/136313/download This test is not yet approved or cleared by the [place] FDA and has been authorized for detection and/or diagnosis of SARS-CoV-2 by FDA under an Emergency Use Authorization (EUA).  This EUA will remain in effect (meaning this  test can be used) for the duration of the COVID-19 declaration under Section 564(b)(1) of the Act, 21 U.S.C. section 360bbb-3(b)(1), unless the authorization is terminated or revoked sooner. Performed at [facility], [address] [place], [place] [address]    Dg Chest 2 View  Result Date: 10/29/2018 CLINICAL DATA:  Shortness of breath for 4 days EXAM: CHEST - 2 VIEW COMPARISON:  10/05/2018 FINDINGS: Chronic cardiomegaly. Small pleural effusions with lower lobe opacity greater on the right. A similar appearance was seen previously. There is a right upper lobe opacity outlining the minor fissure, convincing even when accounting for an overlapping EKG lead. No pneumothorax. IMPRESSION: 1. Right upper lobe opacity that is focal and suspicious for pneumonia. 2. Cardiomegaly with right more than left pleural effusion and lower lobe atelectasis/opacification. Electronically Signed   By: Marnee SpringJonathon  Watts M.D.   On: 10/29/2018 13:52    Pending Labs Unresulted Labs (From admission, onward)    Start     Ordered   10/30/18 0500  Basic metabolic panel  Tomorrow morning,   R     10/29/18 1550   10/30/18 0500  CBC  Tomorrow morning,   R     10/29/18 1550   10/29/18 1553  Troponin I - Now Then Q6H  Now then every 6 hours,   R     10/29/18 1552   10/29/18 1553  T3, free  Once,   R     10/29/18 1552   10/29/18 1553  T4, free  Once,   R     10/29/18 1552   10/29/18 1553  Urine rapid drug screen (hosp performed)  ONCE - STAT,   R     10/29/18 1552   10/29/18 1550  TSH  Once,   R     10/29/18 1550          Vitals/Pain Today's Vitals   10/29/18 1422 10/29/18 1430 10/29/18 1445 10/29/18 1500  BP:  (!) 139/106 (!) 131/111 (!) 133/105  Pulse:      Resp:  Marland Kitchen(!)  30 (!) 31 (!) 32  Temp:      TempSrc:      SpO2:      Weight:      Height:      PainSc: Asleep       Isolation Precautions Droplet and Contact precautions  Medications Medications  enoxaparin (LOVENOX) injection 40 mg (has no  administration in time range)  furosemide (LASIX) injection 80 mg (80 mg Intravenous Given 10/29/18 1335)    Mobility walks Low fall risk   Focused Assessments Pulmonary Assessment Handoff:  Lung sounds: Bilateral Breath Sounds: Rhonchi L Breath Sounds: Rhonchi R Breath Sounds: Rhonchi O2 Device: Room Air        R Recommendations: See Admitting Provider Note  Report given to:   Additional Notes:

## 2018-10-29 NOTE — Progress Notes (Signed)
   Vital Signs MEWS/VS Documentation      10/29/2018 1920 10/29/2018 1925 10/29/2018 1938 10/29/2018 1942   MEWS Score:  5  3  3  3    MEWS Score Color:  Red  Yellow  Yellow  Yellow   Resp:  -  20  -  -   Pulse:  -  (!) 158  -  -   BP:  -  (!) 128/107  -  -   Temp:  -  (!) 97 F (36.1 C)  -  -   O2 Device:  -  Room Air  -  -           Jeanella Flattery 10/29/2018,7:54 PM

## 2018-10-29 NOTE — Progress Notes (Signed)
Paged cards about HR. At rest 150s, and 160s with activity. Also 6 beats of vtach.

## 2018-10-29 NOTE — Discharge Summary (Signed)
Family Medicine Teaching [facility] Discharge Summary  Patient name: Christine Cobb Medical record number: [id number] Date of birth: [birth year] Age: 43 y.o. Gender: female Date of Admission: 10/29/2018  Date of Discharge: 11/15/2018 Admitting Physician: Tobey Grim, MD  Primary Care Provider: Patient, No Pcp Per Consultants: psychiatry, cardiology, palliative care  Indication for Hospitalization: heart failure exacerbation  Discharge Diagnoses/Problem List:  Acute on chronic heart failure Endocarditis RLL pneumonia Bilateral pulmonary embolism Acute mania Substance abuse disorder Copd ckd hyperthyroidism  Disposition: inpatient psychiatry  Discharge Condition: stable  Discharge Exam:  BP 116/88 (BP Location: Right Arm)   Pulse (!) 115   Temp (!) 97.5 F (36.4 C) (Oral)   Resp 20   Ht  (1.626 m)   Wt 63 kg Comment: C scale  LMP 09/13/2018 (Approximate)   SpO2 97%   BMI 23.84 kg/m   General: well nourished, well developed, in no acute distress with non-toxic appearance, walking around room with no clothes on HEENT: normocephalic, atraumatic, moist mucous membranes Neck: supple, normal ROM CV: mildly tachycardic, regular rhythm without murmurs, rubs, or gallops, 2+ LE edema bilaterally to thigh, 2+ radial and pedal pulses bilaterally Lungs: clear to auscultation bilaterally with normal work of breathing on room air Abdomen: soft, non-tender, mildly-distended, normoactive bowel sounds Skin: warm, dry Extremities: warm and well perfused, normal tone MSK: gait normal Neuro: Alert and oriented, more pressured speech this AM with unintelligible cluttering at times, more agitation overnight but appeared calm this AM  Brief Hospital Course:  Christine Cobb is a 43 y.o. female with past medical history significant for current every day smoker with history of COPD, HFrEF (EF 20-25%), HTN, Hyperthyroidism, bipolar mood disorder, schizoaffective  disorder, and polysubstance abuse, who presented with worsening peripheral edema and dyspnea on exertion and found to have CHF exacerbation in the setting medication noncompliance of lasix x 1 month.    Acute on chronic heart failure.  Initial work up with significant for BNP 2729, COVID negative, and CXR with R>L pleural effusion. Patient had an initial elevated troponin of 0.23, which leveled off at 0.65 with endorsement of atypical chest pain over the last month. EKG with sinus tachycardia without ST changes.Cardiology was consulted and noted most likely demand ischemia and to treat with further diuresis. However, given her smptoms of SOB, hymptosis, bilateral LE edema, and tachycardia, some concern for PE. CTA was positive for bilateral pulmonary emboli and areas of possible pulmonary necrosis.  While these areas were initially described as possible pulmonary abscess and pneumonia, they were thought to more likely be areas of necrosis due to PE after discussion with radiology.   On 5/7, patient's blood pressure began to drop.  She also had an increased white count of 15.8.  In the setting of decompensated heart failure and pulmonary emboli, an echo was performed, which showed an EF of 10 to 15% with very high pulmonary artery pressure.  Patient was thought to be in cardiogenic and possibly septic shock.  Milrinone was started for blood pressure support, and blood cultures were collected then vancomycin was started for possible endocarditis due to patient's drug abuse history. Unable to perform TEE d/t patient noncompliance so patient was treated for presumed endocarditis initially but after blood cultures came back negative it was decided to stop IV abx.  As the patient's BP improved, she was started on lisinopril by cardiology. Her potassium and Creatinine increased shortly afterwards and it was discontinued.  The patient was started on  PO  lasix daily before discharge for her heart failure.    Tachycardia: On admission, patient was noted to have tachycardia to 130's. Her UDS was positive for cocaine. She has a history of hyperthyroidism, however thyroid studies WNL. Expect likely  from increased demand and poor contractility, although CTA later revealed bilateral PEs that likely contributed to her tachycardia as well. She was started on carvedilol during hospitalization for heart rate stability and this was continued at discahrge.  Her tachycardia was stable at discharge.  Polysubstance Abuse: Patient endorsed cocaine, marijuana, and tobacco abuse. Last used 2 days prior to admission. UDS positive for cocaine. Metoprolol was held, but carvedilol was eventually started for better control of her heart failure after weighing the risks vs the benefits. Patient did not have withdrawal symptoms during her admission.  Marland Kitchen  Psychosocial: Patient endorsed being homeless without a PCP to obtain medication refills. Social work/case management was consulted. Patient also has a PMH of severe bipolar disorder, MDD, agitation, anxiety, and schizoaffective disorder. Psych was consulted and recommended patient restarting Abilify 10mg  QD and Symmetrel 100mg  BID. She also received her monthly Haldol injection while admitted as well. She was switrched from abilify to haldol nightly to be consistent with her monthly injection.  She started to develop increasing manic symptoms such as pressured speech, delusions of grandeur, restlessness.  Psych was consulted to re-evaluate and agreed to take patient on their inpatient psychiatric service after she finished her IV abx. She initially was IVC'd due to her unwillingness to be trasnferred to inpatient psych. However, after 1 week of further hospitalization, she was reevaluated by psych and was deemed more stable and did not qualify for inpatient psych. CSW was consulted and provided patient with outpatient psych resources. Patient was discharged on Haldol 10mg  qHS,  Amantadine 100mg  BID, Melatonin 3mg  qHS for insomnia, Tranzodone 200mg  qHS for insomnia. Outpatient follow up with psych was emphasized. ON day of discharge, patient was stable, denied any SI or HI.  COPD: Although no wheezing appreciated during admission. Patient does have chronic cough with reported hemoptysis. Likely due to bronchiole irritation. It was decided to discharge patient on Spiriva.   Issues for Follow Up:  1. Acute mania - patient will need to follow up with a psychiatrist outpatient for management of her psychiatric medications.   2. Heart failure - carvedilol and lasix started in hospital. Will need to assess electrolytes on follow up and adjust lasix dose as needed.  3. Unsure of official dry weight for patient, but per chart review was bleived to be ~57kg. She was above this on discharge.  4. Poor prognosis - patient has severe heart failure and is resistant to treatments including pacemaker implantation.  Patient would benefit from palliative care discussion as an outpatient when she has more capacity. 5. Bilateral Pulmonary edema - started on xarelto.  Will need to switch from BID to qaily on 5/26.   6. Homelessness - patient will need help with housing.  She was given Shelter resources at time of discharge 7. Please continue to provide counseling for cessation of drug use given comorbidities 8. Prolonged QTc was noted on admission. Recommend repeat EKG to evaluate for resolution and avoiding QT prolonging agents.  9. Consider Quant Gold to evaluate for TB given chronic cough and cavitary lesions noted on imaging.  Significant Procedures: none  Significant Labs and Imaging:  Recent Labs  Lab 11/09/18 1750 11/10/18 1759 11/11/18 1853  WBC 9.9 9.4 10.3  HGB 10.1* 10.0* 9.9*  HCT  30.4* 31.2* 31.2*  PLT 508* 543* 574*   Recent Labs  Lab 11/10/18 1759 11/11/18 1853 11/12/18 1806 11/13/18 1840 11/14/18 1815  NA 132* 133* 129* 133* 133*  K 4.6 4.6 4.8 4.3 4.5  CL  96* 94* 94* 96* 95*  CO2 GLUCOSE 126* 109* 157* 94 116*  BUN 32* 32* 28* 25* 24*  CREATININE 1.38* 1.32* 1.04* 1.36* 1.33*  CALCIUM 8.7* 8.9 8.2* 8.6* 8.7*    BNP: 2729 Troponin 0.23>0.32>0.62>0.65 COVID: negative TSH: 0.646 T4: 1.50  Drugs of Abuse     Component Value Date/Time   LABOPIA NONE DETECTED 10/29/2018 1607   COCAINSCRNUR POSITIVE (A) 10/29/2018 1607   LABBENZ NONE DETECTED 10/29/2018 1607   AMPHETMU NONE DETECTED 10/29/2018 1607   THCU NONE DETECTED 10/29/2018 1607   LABBARB NONE DETECTED 10/29/2018 1607     Dg Chest 2 View  Result Date: 10/29/2018 CLINICAL DATA:  Shortness of breath for 4 days EXAM: CHEST - 2 VIEW COMPARISON:  10/05/2018 FINDINGS: Chronic cardiomegaly. Small pleural effusions with lower lobe opacity greater on the right. A similar appearance was seen previously. There is a right upper lobe opacity outlining the minor fissure, convincing even when accounting for an overlapping EKG lead. No pneumothorax. IMPRESSION: 1. Right upper lobe opacity that is focal and suspicious for pneumonia. 2. Cardiomegaly with right more than left pleural effusion and lower lobe atelectasis/opacification. Electronically Signed   By: Marnee Spring M.D.   On: 10/29/2018 13:52   Ct Angio Chest Pe W Or Wo Contrast  Result Date: 10/31/2018 CLINICAL DATA:  PE suspected EXAM: CT ANGIOGRAPHY CHEST WITH CONTRAST TECHNIQUE: Multidetector CT imaging of the chest was performed using the standard protocol during bolus administration of intravenous contrast. Multiplanar CT image reconstructions and MIPs were obtained to evaluate the vascular anatomy. CONTRAST:  80mL OMNIPAQUE IOHEXOL 350 MG/ML SOLN COMPARISON:  Chest x-ray 10/29/2018.  Chest CT 08/10/2018 FINDINGS: Cardiovascular: Filling defects are noted within bilateral lower lobe pulmonary arteries compatible with pulmonary emboli. No evidence of right heart strain. Heart is enlarged. Aorta is normal caliber.  Mediastinum/Nodes: No mediastinal, hilar, or axillary adenopathy. Lungs/Pleura: Large right pleural effusion and small left pleural effusion. Area of masslike consolidation in the right lung base with locules of gas measures 4.7 x 4.5 cm. There are scattered ground-glass airspace opacities in the peripheral inferior right upper lobe and in the right lower lobe. This could be infectious or related to pulmonary infarcts. Peripheral ground-glass opacity also noted posteriorly in the left lower lobe which again could be infectious or related to pulmonary infarct. Upper Abdomen: Evaluation limited due to artifact related to the cardiac monitor sitting on the anterior abdomen of the patient. Musculoskeletal: Chest wall soft tissues are unremarkable. No acute bony abnormality. Review of the MIP images confirms the above findings. IMPRESSION: Small to medium-sized bilateral lower lobe pulmonary emboli. No evidence of right heart strain. Cardiomegaly. Large right pleural effusion and small left pleural effusion. Dense consolidation in the right lower lobe with masslike consolidation and locules of gas noted. This area could reflect pneumonia and/or pulmonary abscess. Scattered ground-glass opacities in the lungs could be infectious or related to pulmonary infarcts. These results will be called to the ordering clinician or representative by the Radiologist Assistant, and communication documented in the PACS or zVision Dashboard. Electronically Signed   By: Charlett Nose M.D.   On: 10/31/2018 22:58   [place] Ekg Site Rite  Result Date: 11/02/2018 If [employer]  not attached, placement could not be confirmed due to current cardiac rhythm.  Dg Chest 2 View  Result Date: 10/29/2018 CLINICAL DATA:  Shortness of breath for 4 days EXAM: CHEST - 2 VIEW COMPARISON:  10/05/2018 FINDINGS: Chronic cardiomegaly. Small pleural effusions with lower lobe opacity greater on the right. A similar appearance was seen previously. There is a  right upper lobe opacity outlining the minor fissure, convincing even when accounting for an overlapping EKG lead. No pneumothorax. IMPRESSION: 1. Right upper lobe opacity that is focal and suspicious for pneumonia. 2. Cardiomegaly with right more than left pleural effusion and lower lobe atelectasis/opacification. Electronically Signed   By: Marnee Spring M.D.   On: 10/29/2018 13:52   Ct Angio Chest Pe W Or Wo Contrast  Result Date: 10/31/2018 CLINICAL DATA:  PE suspected EXAM: CT ANGIOGRAPHY CHEST WITH CONTRAST TECHNIQUE: Multidetector CT imaging of the chest was performed using the standard protocol during bolus administration of intravenous contrast. Multiplanar CT image reconstructions and MIPs were obtained to evaluate the vascular anatomy. CONTRAST:  80mL OMNIPAQUE IOHEXOL 350 MG/ML SOLN COMPARISON:  Chest x-ray 10/29/2018.  Chest CT 08/10/2018 FINDINGS: Cardiovascular: Filling defects are noted within bilateral lower lobe pulmonary arteries compatible with pulmonary emboli. No evidence of right heart strain. Heart is enlarged. Aorta is normal caliber. Mediastinum/Nodes: No mediastinal, hilar, or axillary adenopathy. Lungs/Pleura: Large right pleural effusion and small left pleural effusion. Area of masslike consolidation in the right lung base with locules of gas measures 4.7 x 4.5 cm. There are scattered ground-glass airspace opacities in the peripheral inferior right upper lobe and in the right lower lobe. This could be infectious or related to pulmonary infarcts. Peripheral ground-glass opacity also noted posteriorly in the left lower lobe which again could be infectious or related to pulmonary infarct. Upper Abdomen: Evaluation limited due to artifact related to the cardiac monitor sitting on the anterior abdomen of the patient. Musculoskeletal: Chest wall soft tissues are unremarkable. No acute bony abnormality. Review of the MIP images confirms the above findings. IMPRESSION: Small to medium-sized  bilateral lower lobe pulmonary emboli. No evidence of right heart strain. Cardiomegaly. Large right pleural effusion and small left pleural effusion. Dense consolidation in the right lower lobe with masslike consolidation and locules of gas noted. This area could reflect pneumonia and/or pulmonary abscess. Scattered ground-glass opacities in the lungs could be infectious or related to pulmonary infarcts. These results will be called to the ordering clinician or representative by the Radiologist Assistant, and communication documented in the PACS or zVision Dashboard. Electronically Signed   By: Charlett Nose M.D.   On: 10/31/2018 22:58   [place] Ekg Site Rite  Result Date: 11/02/2018 If Site Rite image not attached, placement could not be confirmed due to current cardiac rhythm.  Results/Tests Pending at Time of Discharge: none  Discharge Medications:  Allergies as of 11/15/2018      Reactions   Amoxil [amoxicillin] Anaphylaxis   Did it involve swelling of the face/tongue/throat, SOB, or low BP? yes Did it involve sudden or severe rash/hives, skin peeling, or any reaction on the inside of your mouth or nose? yes Did you need to seek medical attention at a hospital or doctor's office? unknown When did it last happen?unknoen If all above answers are "NO", may proceed with cephalosporin use.   Hydroxyzine    Other reaction(s): Bleeding   Ketorolac Tromethamine Hives, Swelling   Prenatal [b-plex Plus] Nausea Only   Tegretol [carbamazepine] Other (See Comments)   Swelling, skin  peeling, skin discoloration   B-plex Plus Nausea Only   Prenatal   Tegretol [carbamazepine] Swelling, Other (See Comments)   Skin peeling Skin discoloring       Medication List    STOP taking these medications   albuterol 108 (90 Base) MCG/ACT inhaler Commonly known as:  VENTOLIN HFA   digoxin 0.125 MG tablet Commonly known as:  LANOXIN     TAKE these medications   acetaminophen 325 MG tablet Commonly known  as:  TYLENOL Take 2 tablets (650 mg total) by mouth every 6 (six) hours as needed for mild pain or headache.   amantadine 100 MG capsule Commonly known as:  SYMMETREL Take 1 capsule (100 mg total) by mouth 2 (two) times daily.   calcium carbonate 500 MG chewable tablet Commonly known as:  TUMS - dosed in mg elemental calcium Chew 1 tablet (200 mg of elemental calcium total) by mouth 2 (two) times daily as needed for indigestion or heartburn.   carvedilol 3.125 MG tablet Commonly known as:  COREG Take 1 tablet (3.125 mg total) by mouth 2 (two) times daily with a meal.   feeding supplement (ENSURE ENLIVE) Liqd Take 237 mLs by mouth 3 (three) times daily between meals.   furosemide 80 MG tablet Commonly known as:  LASIX Take 2 tablets (160 mg total) by mouth daily. Start taking on:  Nov 16, 2018 What changed:    medication strength  how much to take  when to take this  reasons to take this   guaiFENesin-dextromethorphan 100-10 MG/5ML syrup Commonly known as:  ROBITUSSIN DM Take 5 mLs by mouth every 4 (four) hours as needed for cough.   haloperidol 10 MG tablet Commonly known as:  HALDOL Take 1 tablet (10 mg total) by mouth at bedtime.   Melatonin 3 MG Tabs Take 1-2 tablets (3-6 mg total) by mouth at bedtime as needed (for sleep).   multivitamin with minerals Tabs tablet Take 1 tablet by mouth daily. Start taking on:  Nov 16, 2018   nicotine 7 mg/24hr patch Commonly known as:  NICODERM CQ - dosed in mg/24 hr Place 1 patch (7 mg total) onto the skin daily.   polyethylene glycol 17 g packet Commonly known as:  MIRALAX / GLYCOLAX Take 17 g by mouth 2 (two) times daily.   Rivaroxaban 15 MG Tabs tablet Commonly known as:  XARELTO Take 1 tablet (15 mg total) by mouth 2 (two) times daily with a meal for 6 days.   rivaroxaban 20 MG Tabs tablet Commonly known as:  XARELTO Take 1 tablet (20 mg total) by mouth daily. Start taking on:  Nov 21, 2018   rivaroxaban 20 MG  Tabs tablet Commonly known as:  Xarelto Take 1 tablet (20 mg total) by mouth daily with supper. Start taking on:  Nov 22, 2018   tiotropium 18 MCG inhalation capsule Commonly known as:  Spiriva HandiHaler Place 1 capsule (18 mcg total) into inhaler and inhale every morning.   traZODone 100 MG tablet Commonly known as:  DESYREL Take 2 tablets (200 mg total) by mouth at bedtime.       Discharge Instructions: Please refer to Patient Instructions section of EMR for full details.  Patient was counseled important signs and symptoms that should prompt return to medical care, changes in medications, dietary instructions, activity restrictions, and follow up appointments.   Follow-Up Appointments: Follow-up Information    [facility]. Schedule an appointment as soon as possible for a visit in 1  week(s).   Why:  for hospital follow up and medication refills Contact information: [address] [place], [place] [address]       [facility]. Go on 11/24/2018.   Why:  @9 :30am Contact information: [address] [place] [phone]          Joana ReamerMullis, Kiersten P, DO 11/15/2018, 12:54 PM PGY-1, [facility]

## 2018-10-29 NOTE — Progress Notes (Addendum)
Family Medicine Teaching Service Daily Progress Note Intern Pager: [phone]  Patient name: Christine Cobb Medical record number: [id number] Date of birth: 10/04/1975 Age: 43 y.o. Gender: female  Primary Care Provider: Patient, No Pcp Per Consultants: Cardiology Code Status: Full  Pt Overview and Major Events to Date:  5/3 Admitted to FPTS, CXR R>L pulmonary effusion, RUL opacity  Assessment and Plan: Christine Cobb is a 43 y.o. female presenting with worsening peripheral edema and dyspnea on exertion. PMH is significant for current every day smoker with history of COPD, HFrEF (EF 20-25%), HTN, Hyperthyroidism, bipolar mood disorder, schizoaffective disorder, and polysubstance abuse.   CHF Exacerbation  Anasarca  HFrEF: Presented with worsening peripheral edema, DOE, fatigue, and orthopnea in the of setting medication noncompliance of lasix x 1 month. S/P 160 mg IV lasix total overnight. Hospitalized in February 2020 for same thing, was deemed not a candidate for advanced HF therapies due to substance abuse and baseline psych history. She was supposed to establish care at HF clinic but did not. Overnight wt increase 61.2>68.6kg (likely inaccurate). 2.6L out overnight. Patient notes no improvement in SOB. Little change in peripheral/abdominal/facial edema overall. Patient remained hemodynamcially stable and afebrile overnight. Remains extremely tachycardic to 150's. - Per HF, continue 40mg  IV lasix TID, Bidil 0.5mg  TID, tachycardia and ventricular trigeminy 2/2 to heart failure decompensation - follow up repeat EKG  - strict I/O's, daily weights - cardiac monitoring, pulse ox - vital per floor - heart healthy, 2g sodium diet - avoid beta blockers, nonhydro CCB's  Elevated Troponin: Troponin 0.23>0.32>0.62. But denies chest pain this AM. Discussed with heart failure. Unlikely ACS given lack of chest pain and normal EKG. Most likely demand ischemia from decompensated HF.  - Per  cards, pt needs invasive coronary evaluation to r/o ischemia cardiomyopathy. Pt has refused coronary angiography in past - follow up cards recs  - continue to monitor - continue to trend trops - follow up repeat EKG  Tachycardia: HR to 130's on admission. Remained in 130-150's overnight, 150's with exertion. Thyroid studies WNL. UDS positive for cocaine. Expect likely 2/2 to inceased demand and poor contractility from HF decompensation.  - avoid BB's, nonhydro CCBs - continue to trend HR with fluid status - Will refer to Cardiology for HR reducing recs if indicated - follow up repeat EKG  HTN:  BP normotensive overnight.  - Per cards, avoid ACE/ARB/ARNI/Aldosterone antagonist for now  Acute on CKD: Likely cardiorenal syndrome. Creatinine 1.39 on admission, worse this AM to 1.43. - avoid nephrotoxic meds including ACE/ARB/ARNI/Aldosterone antagonist for now - AM BMP  H/o Hyperthyroidism: TSH, T4/T3 WNL.   COPD: Home meds: Albuterol PRN. No wheezing appreciated on exam.  Polysubstance Abuse: Current everyday tobacco user (~3 cigarettes per day). History of cocaine and marijuana use, last used 2-3 days. UDS positive for cocaine. - avoid BB given history - Nicotine patch - continue to encourage cessation  Psychiatric History: History of severe bipolar disorder, MDD, agitation, anxiety, and schizoaffective disorder. Many ED visits associated with psych history. Patient poor historian. Per chart review, past psych meds include: Aripiprazole 882mg  injection q62months, PO Abilify 30mg  QD, Depakote 750mg  QD, Vistaril 50mg  for anxiety, Amantadine 100mg  BID for agitation, Mirtazapine 15mg  for MDD. Curerntly not on any medications. Notes last manic episode ~4 months ago. Appears she was IVC'd in January 2019 at [facility]. Currently denies any manic type behavior, SI/HI, visual/audio hallucinations. - Continue to monitor - consult psych for further evaluation and med recs  Homelessness  No PCP: Patient is staying in a "house" on [address]. Plans to go to shelter after discharge. No PCP. Endorses she receives her "refills" when she is discharged and once she runs out she returns to the ED when becomes symptomatic. - CSW consulted, will follow up - Case management consulted, will follow up  FEN/GI: heart healthy diet PPx: Lovenox  Disposition: Pending improvement  Subjective:  Patient irritated and wants to be left alone. Would only answer some questions when asked repeatedly. Did not want to cooperate during exam. Denies any chest pain. Does endorse more SOB. Denies headache or vision changes.   Objective: Temp:  [98.1 F (36.7 C)-98.6 F (37 C)] 98.6 F (37 C) (05/03 1720) Pulse Rate:  [44-47] 47 (05/03 1720) Resp:  [23-35] 30 (05/03 1600) BP: (124-145)/(98-125) 145/102 (05/03 1720) SpO2:  [95 %-100 %] 99 % (05/03 1720) Weight:  [61.2 kg] 61.2 kg (05/03 1206) Physical Exam: General: irritable african [place] female, well nourished, well developed, NAD, lying in bed demanding to be left alone HEENT: normocephalic, atraumatic, diffuse facial swelling noted Neck: supple, normal ROM, JVD to angle of mandible still appreciated CV: regular rhythm and intact distal pulses, tachycardia present, no murmur, Anasarca unchanged from day prior Lungs: Effort appears increased, breath sounds normal. No respiratory distress. No wheezes. Exam difficult given refusal to cooperate during exam Abdomen: soft, mildly tender, less distended, normoactive bowel sounds Skin: warm, dry Extremities: warm and well perfused Neuro: not assessed given refusal to cooperate   Laboratory: Recent Labs  Lab 10/29/18 1235  WBC 10.3  HGB 12.8  HCT 41.1  PLT 216   Recent Labs  Lab 10/29/18 1235  NA 136  K 4.5  CL 106  CO2 18*  BUN 24*  CREATININE 1.39*  CALCIUM 8.8*  GLUCOSE 150*    BNP: 2729 Troponin 0.23>0.32>0.62 COVID: negative TSH: 0.646 T4: 1.50 T3:  2.0  Imaging/Diagnostic Tests: Dg Chest 2 View  Result Date: 10/29/2018 CLINICAL DATA:  Shortness of breath for 4 days EXAM: CHEST - 2 VIEW COMPARISON:  10/05/2018 FINDINGS: Chronic cardiomegaly. Small pleural effusions with lower lobe opacity greater on the right. A similar appearance was seen previously. There is a right upper lobe opacity outlining the minor fissure, convincing even when accounting for an overlapping EKG lead. No pneumothorax. IMPRESSION: 1. Right upper lobe opacity that is focal and suspicious for pneumonia. 2. Cardiomegaly with right more than left pleural effusion and lower lobe atelectasis/opacification. Electronically Signed   By: Marnee Spring M.D.   On: 10/29/2018 13:52    Joana Reamer, DO 10/29/2018, 6:42 PM PGY-1, [facility] Family Medicine FPTS Intern pager: [phone], text pages welcome

## 2018-10-29 NOTE — H&P (Addendum)
Family Medicine Teaching [facility] Admission History and Physical Service Pager: [phone]  Patient name: Christine Cobb Medical record number: [id number] Date of birth: [birth year] Age: 43 y.o. Gender: female  Primary Care Provider: Patient, No Pcp Per Consultants: Cardiology Code Status: FULL  Chief Complaint: shortness of breath [name] [phone]  Assessment and Plan: Konner Warrior is a 43 y.o. female presenting with worsening peripheral edema and dyspnea on exertion. PMH is significant for current every day smoker with history of COPD, HFrEF (EF 20-25%), HTN, Hyperthyroidism, bipolar mood disorder, schizoaffective disorder, and polysubstance abuse.   CHF Exacerbation  HFrEF: Patient presented with significant peripheral edema, worsening DOE, fatigue, and orthopnea d/t missed lasix x 1 month. BNP elevated at 2729 and last echo on 08/10/18 significant for severely reduced systolic function, EF 20-25%, severe RV systolic pressure (66.75mmHg) and severely dilated left atrium. CXR with R>L pleural effusion, lower lobe atelectasis,and some concern for RUL PNA. Patient does endorse cough x 1 month of grey/blood tinged sputum, however afebrile without leukocytosis makes PNA less likely. Lung exam difficult given patient's noncompliance. Additionally, patient endorsed some mild sharp intermittent left sided chest pain (unable to describe worsening/improving symptoms, denies any current chest pain) and troponin elevated to 0.23 on presentation, however EKG with sinus tachycardia without ST changes. Thus, likely demand ischemia however will trend troponins to further evaluate. Patient is currently homeless without a PCP. She receives her medications and rx refills at time of discharge from hospital. On admission, patient was hemodynamically stable on room air and afebrile with vitals significant for tachycardia (130's). Patient appeared hypervolemic with 2+ pitting edema to thigh,  abdominal distention, bilateral UE swelling, and facial swelling. JVD difficult to assess given facial and neck swelling but appears to measure ~11 cm (to angle of mandible).  Weight on admission: 61.2kg (135lbs), however my be inaccurate given hyperthyroidism. Current home meds include Digoxin 0.125mg  QD and Lasix  QD, although patient has not been taking them for the past month. In the ED, patient received  IV lasix x 1. Will continue to diurese patient at this time with low threshold to start antibiotics if develops signs or symptoms concerning for PNA. - admit to Telemetry, FPTS1, attending Dr. Gwendolyn Grant - S/p  IV lasix - Strict I/O's, daily weights - cardiac monitoring, pulse ox - vitals per floor - hold home meds  - trend troponins x 2 - AM BMP, CBC - Follow up TSH, T3/T4, UDS - consider consult to HF tomorrow morning - heart healthy, low sodium diet  Tachycardia: Heart rates in 130's on admission. History of cocaine use (last reported use 2-3 days ago) and hyperthryoidism/thyroid storm although last TSH was WNL in 07/2018. May be secondary to increased demand and poor contractility. Currently denies any chest pain. - TSH level, T3/T4 - avoid beta blockers given cocaine history, COPD  HTN: BP on admission: 140/101, improved to 124/98. Was given BB and Losartan at last admission in Feb 2020 but became hypotensive and thus were discontinued. Currently not on any antihypertensives.  - continue to monitor - follow up UDS and thyroid levels  Acute on CKD: Creatinine on admission: 1.39 (baseline 1.1). Likely from cardiorenal syndrome. - Avoid nephrotoxic meds - AM BMP  COPD: Home meds: Albuterol PRN. No wheezing appreciated on exam.  Hyperthyroidism: TSH 0.492 in 08/09/2018. Currently asymptomatic.  - TSH level  Polysubstance Abuse: Current everyday tobacco user (~3 cigarettes per day). History of cocaine and marijuana use, last used 2-3 days.  - avoid  BB given history -  UDS - Nicotine patch  Psychiatric History: History of severe bipolar disorder and schizoaffective disorder. Patient poor historian. Notes last manic episode ~4 months ago, denies similar symptoms currently. Denis any SI/HI. Unsure of home meds, although past home meds included: Abilify +/- Amantadine +/- Haldol. Currently not taking anything. - Continue to monitor  Homelessness  No PCP: Patient is staying in a "house" on [address]. Plans to go to shelter after discharge. No PCP. Endorses she receives her "refills" when she is discharged and once she runs out she returns to the ED when becomes symptomatic. - Consult social work  FEN/GI: Heart healthy diet Prophylaxis: Lovenox  Disposition: Telemetry, pending clinical improvement  History of Present Illness:  Christine Cobb is a 43 y.o. female presenting with shortness of breath and swelling.  She reports that she ran out of Lasix about one month ago and has had increased swelling in her legs and face since then.  She has had cough productive of blood tinged sputum for the past two weeks.  She denies any sick contacts.  She went to the ED once in early April with peripheral edema but was not admitted at that time.  She endorses orthopnea and shortness of breath that worsens when she walks.  She has that she has sharp chest pain that comes and goes and is located in the upper left side of her chest.  She denies any exacerbating or inciting factors of this chest pain.  She endorses palpitations, which she has had for a while.  She reports that her thyroid medication caused her to gain weight so she stopped taking it.  She has been out of her Abilify for one month.  She does not have a PCP and cannot tell us who prescribes her medications.  She reports that she last used cocaine and THC a couple of days ago.  She smokes three cigarettes per day.  She denies alcohol use.  In the ED, patient was noted to have tachycardia 110-130's, tachypnea  (20-30's), BP 130-140's/110"s. She was afebrile with O2 sats in 98-100%. CBC unremarkable with normal WBC. BMP with bicarb 18, Cr 1.39, BUN 24. BNP 2729. Troponin 0.23. COVID negative. CXR significant for RUL opacity suspicious for PNA and R>L pleural effusion. EKG with sinus tachycardia without ST changes. Patient was given 80mg  IV lasix x 1 and admitted for further diuresis.   Review Of Systems: Per HPI with the following additions:   Review of Systems  Constitutional: Positive for chills. Negative for fever.  HENT: Negative for congestion and sore throat.   Respiratory: Positive for cough (with gray and bloody sputum) and shortness of breath.   Cardiovascular: Positive for chest pain and palpitations.  Gastrointestinal: Positive for abdominal pain. Negative for diarrhea, nausea and vomiting.  Genitourinary: Negative for dysuria.  Neurological: Positive for weakness and headaches. Negative for loss of consciousness.  Psychiatric/Behavioral: Positive for depression and substance abuse. Negative for hallucinations and suicidal ideas. The patient has insomnia.     Patient Active Problem List   Diagnosis Date Noted  . Malnutrition of moderate degree 08/11/2018  . Hemoptysis 08/09/2018  . Respiratory failure (HCC)   . Chest pain   . Community acquired pneumonia of right lower lobe of lung (HCC)   . Bipolar affective disorder, manic, severe (HCC) 04/30/2017  . Severe bipolar affective disorder with psychosis (HCC) 04/29/2017  . Cocaine abuse with cocaine-induced mood disorder (HCC) 06/02/2016  . Schizoaffective disorder, bipolar type (HCC)   .  Bipolar disorder, current episode manic without psychotic features, severe (HCC)   . Agitation   . Manic behavior (HCC)   . Hyperthyroidism 09/21/2013  . Marijuana abuse 04/12/2013  . History of CHF (congestive heart failure) 02/01/2013  . Abscess of abdominal wall 10/18/2012  . HTN (hypertension) 04/08/2011  . Tachycardia 04/08/2011    Past  Medical History: Past Medical History:  Diagnosis Date  . Arm pain   . Bipolar affective disorder, currently manic, mild (HCC)   . CHF (congestive heart failure) (HCC)   . Eye globe prosthesis   . HTN (hypertension)   . Hyperthyroidism   . Sinus tachycardia     Past Surgical History: Past Surgical History:  Procedure Laterality Date  . DILATION AND CURETTAGE OF UTERUS    . EYE SURGERY      Social History: Social History   Tobacco Use  . Smoking status: Current Every Day Smoker    Types: Cigarettes    Last attempt to quit: 08/21/2013    Years since quitting: 5.1  . Smokeless tobacco: Never Used  Substance Use Topics  . Alcohol use: Yes    Comment: Last drink: 1/2 beer PTA  . Drug use: Yes    Types: Marijuana    Comment: Pt sts "anything and everything"   Additional social history: Endorses marijuana, cocaine - last used 2-3 days ago. Denies alcohol use. Tobacco: 3 cigarettes/day. Currently homeless. Please also refer to relevant sections of EMR.  Family History: Family History  Problem Relation Age of Onset  . Hypertension Other   . Emphysema Other   . Asthma Son   . Diabetes Maternal Uncle   . Diabetes Paternal Grandmother     Allergies and Medications: Allergies  Allergen Reactions  . Amoxil [Amoxicillin] Anaphylaxis    Did it involve swelling of the face/tongue/throat, SOB, or low BP? yes Did it involve sudden or severe rash/hives, skin peeling, or any reaction on the inside of your mouth or nose? yes Did you need to seek medical attention at a hospital or doctor's office? unknown When did it last happen?unknoen If all above answers are "NO", may proceed with cephalosporin use.   Marland Kitchen. Hydroxyzine     Other reaction(s): Bleeding  . Ketorolac Tromethamine Hives and Swelling  . Prenatal [B-Plex Plus] Nausea Only  . Tegretol [Carbamazepine] Other (See Comments)    Swelling, skin peeling, skin discoloration  . B-Plex Plus Nausea Only    Prenatal   .  Tegretol [Carbamazepine] Swelling and Other (See Comments)    Skin peeling Skin discoloring    No current facility-administered medications on file prior to encounter.    Current Outpatient Medications on File Prior to Encounter  Medication Sig Dispense Refill  . albuterol (PROVENTIL HFA;VENTOLIN HFA) 108 (90 Base) MCG/ACT inhaler Inhale 2 puffs into the lungs every 4 (four) hours as needed for wheezing or shortness of breath. (Patient not taking: Reported on 10/05/2018) 1 Inhaler 3  . albuterol (PROVENTIL) (2.5 MG/3ML) 0.083% nebulizer solution Take 3 mLs (2.5 mg total) by nebulization every 6 (six) hours as needed for wheezing or shortness of breath. (Patient not taking: Reported on 10/05/2018) 75 mL 3  . amantadine (SYMMETREL) 100 MG capsule Take 1 capsule (100 mg total) 2 (two) times daily by mouth. (Patient not taking: Reported on 08/24/2018) 60 capsule 0  . ARIPiprazole (ABILIFY) 10 MG tablet Take 1 tablet (10 mg total) daily by mouth. (Patient not taking: Reported on 10/05/2018) 30 tablet 0  . Ascorbic Acid (VITAMIN  C PO) Take 1 tablet by mouth daily.    Marland Kitchen aspirin 81 MG chewable tablet Chew 81 mg by mouth daily.    . digoxin (LANOXIN) 0.125 MG tablet Take 1 tablet (0.125 mg total) by mouth daily. (Patient not taking: Reported on 08/24/2018) 30 tablet 0  . diphenhydrAMINE (BENADRYL) 25 MG tablet Take 25 mg by mouth at bedtime as needed for sleep.    . furosemide (LASIX) 20 MG tablet Take 1 tablet (20 mg total) by mouth daily as needed for fluid or edema. 30 tablet 1  . furosemide (LASIX) 20 MG tablet Take 1 tablet (20 mg total) by mouth daily as needed for fluid or edema. 30 tablet 0  . mirtazapine (REMERON) 15 MG tablet Take 1 tablet (15 mg total) at bedtime by mouth. (Patient not taking: Reported on 08/24/2018) 30 tablet 0  . nicotine polacrilex (NICORETTE) 2 MG gum Take 1 each (2 mg total) by mouth as needed for smoking cessation. (Patient not taking: Reported on 08/24/2018) 100 tablet 0  .  VITAMIN D PO Take 1 tablet by mouth daily.      Objective: BP (!) 124/98 (BP Location: Right Arm)   Pulse (!) 47   Temp 98.1 F (36.7 C) (Oral)   Resp (!) 28   Ht 5\' 4"  (1.626 m)   Wt 61.2 kg   LMP 09/13/2018 (Approximate)   SpO2 100%   BMI 23.17 kg/m  Exam: General: swollen african [place] woman, in no acute distress with non-toxic appearance, propped up comfortably in ED bed eating a sandwhich HEENT: normocephalic, atraumatic Neck: supple, normal ROM CV: elevated heart rate with normal rhythm, 2+ LE edema to thigh bilaterally, bilateral UE nonpitting edema, facial swelling, 2+ radial pulses, pedal pulses difficult to palpate due to swelling, JVD to mandibular angle (~11cm) Lungs: clear to auscultation bilaterally, no crackles appreciated although difficult to assess given noncompliance to exam, normal work of breathing on RA Abdomen: soft, tender, distended, normoactive bowel sounds Skin: warm, dry, no rashes or lesions Extremities: warm and well perfused, no cyanosis Neuro: Alert and oriented, speech normal, denies any SI/HI   Labs and Imaging: CBC BMET  Recent Labs  Lab 10/29/18 1235  WBC 10.3  HGB 12.8  HCT 41.1  PLT 216   Recent Labs  Lab 10/29/18 1235  NA 136  K 4.5  CL 106  CO2 18*  BUN 24*  CREATININE 1.39*  GLUCOSE 150*  CALCIUM 8.8*     BNP: 2729 Troponin 0.23 COVID: negative  Dg Chest 2 View  Result Date: 10/29/2018 CLINICAL DATA:  Shortness of breath for 4 days EXAM: CHEST - 2 VIEW COMPARISON:  10/05/2018 FINDINGS: Chronic cardiomegaly. Small pleural effusions with lower lobe opacity greater on the right. A similar appearance was seen previously. There is a right upper lobe opacity outlining the minor fissure, convincing even when accounting for an overlapping EKG lead. No pneumothorax. IMPRESSION: 1. Right upper lobe opacity that is focal and suspicious for pneumonia. 2. Cardiomegaly with right more than left pleural effusion and lower lobe  atelectasis/opacification. Electronically Signed   By: Marnee Spring M.D.   On: 10/29/2018 13:52   Joana Reamer, DO 10/29/2018, 2:52 PM PGY-1, [facility] FPTS Intern pager: [phone], text pages welcome  FPTS Upper-Level Resident Addendum   I have independently interviewed and examined the patient. I have discussed the above with the original author and agree with their documentation. My edits for correction/addition/clarification are in blue. Please see also any  attending notes.    Lennox Solders, MD PGY-2, [facility] 10/29/2018 4:56 PM  FPTS Service pager: [phone] (text pages welcome through [facility])

## 2018-10-30 DIAGNOSIS — I5043 Acute on chronic combined systolic (congestive) and diastolic (congestive) heart failure: Secondary | ICD-10-CM

## 2018-10-30 LAB — CBC
HCT: 36.8 % (ref 36.0–46.0)
Hemoglobin: 12.1 g/dL (ref 12.0–15.0)
MCH: 24.9 pg — ABNORMAL LOW (ref 26.0–34.0)
MCHC: 32.9 g/dL (ref 30.0–36.0)
MCV: 75.9 fL — ABNORMAL LOW (ref 80.0–100.0)
Platelets: 202 10*3/uL (ref 150–400)
RBC: 4.85 MIL/uL (ref 3.87–5.11)
RDW: 17.2 % — ABNORMAL HIGH (ref 11.5–15.5)
WBC: 10.2 10*3/uL (ref 4.0–10.5)
nRBC: 0.7 % — ABNORMAL HIGH (ref 0.0–0.2)

## 2018-10-30 LAB — BASIC METABOLIC PANEL
Anion gap: 12 (ref 5–15)
BUN: 25 mg/dL — ABNORMAL HIGH (ref 6–20)
CO2: 20 mmol/L — ABNORMAL LOW (ref 22–32)
Calcium: 8.5 mg/dL — ABNORMAL LOW (ref 8.9–10.3)
Chloride: 105 mmol/L (ref 98–111)
Creatinine, Ser: 1.43 mg/dL — ABNORMAL HIGH (ref 0.44–1.00)
GFR calc Af Amer: 53 mL/min — ABNORMAL LOW (ref >60)
GFR calc non Af Amer: 46 mL/min — ABNORMAL LOW (ref >60)
Glucose, Bld: 102 mg/dL — ABNORMAL HIGH (ref 70–99)
Potassium: 4.1 mmol/L (ref 3.5–5.1)
Sodium: 137 mmol/L (ref 135–145)

## 2018-10-30 LAB — TROPONIN I
Troponin I: 0.62 ng/mL (ref <0.03)
Troponin I: 0.65 ng/mL (ref <0.03)

## 2018-10-30 LAB — T3, FREE: T3, Free: 2 pg/mL (ref 2.0–4.4)

## 2018-10-30 MED ORDER — ADULT MULTIVITAMIN W/MINERALS CH
1.0000 | ORAL_TABLET | Freq: Every day | ORAL | Status: DC
  Administered 2018-10-30 – 2018-11-15 (×17): 1 via ORAL
  Filled 2018-10-30 (×17): qty 1

## 2018-10-30 MED ORDER — ENSURE ENLIVE PO LIQD
237.0000 mL | Freq: Three times a day (TID) | ORAL | Status: DC
  Administered 2018-10-30 – 2018-11-15 (×44): 237 mL via ORAL

## 2018-10-30 MED ORDER — ISOSORB DINITRATE-HYDRALAZINE 20-37.5 MG PO TABS
1.0000 | ORAL_TABLET | Freq: Three times a day (TID) | ORAL | Status: DC
  Administered 2018-10-30 – 2018-11-01 (×6): 1 via ORAL
  Filled 2018-10-30 (×8): qty 1

## 2018-10-30 NOTE — Progress Notes (Addendum)
Pt had shower and refused to have telemetry back on  As she just took bath and is cold, I educated importance of monitoring with fluid removal and electrolytes, pt allowed nurse to place telemetry and called CCMD to confirm it is transmitting. Pt also asked for ensure, I advised this is her last one tonight and fluid restrction "I need my ensure"

## 2018-10-30 NOTE — Progress Notes (Addendum)
Subjective:  Continues to have shortness of breath  Objective:  Vital Signs in the last 24 hours: Temp:  [97 F (36.1 C)-98.9 F (37.2 C)] 98.9 F (37.2 C) (05/04 1205) Pulse Rate:  [47-158] 143 (05/04 1205) Resp:  [18-20] 19 (05/04 1205) BP: (107-145)/(79-109) 107/79 (05/04 1205) SpO2:  [95 %-100 %] 98 % (05/04 1205) Weight:  [68.6 kg-69.2 kg] 69.2 kg (05/04 1311)  Intake/Output from previous day: 05/03 0701 - 05/04 0700 In: 957 [P.O.:957] Out: 2600 [Urine:2600]  Physical Exam  Constitutional: She appears well-developed and well-nourished. No distress.  HENT:  Head: Normocephalic and atraumatic.  Eyes:  Could not be assessed  Neck: JVD present.  Cardiovascular: Regular rhythm and intact distal pulses. Tachycardia present. Exam reveals gallop.  No murmur heard. Pulmonary/Chest: Effort normal and breath sounds normal. No respiratory distress. She has no wheezes. She has no rales.  Abdominal: Soft. Bowel sounds are normal. There is no rebound.  Musculoskeletal:        General: Edema (3+ n/l) present.  Lymphadenopathy:    She has no cervical adenopathy.  Neurological: She is alert. No cranial nerve deficit.  Arousable, but refuses to talk to me  Skin: Skin is warm and dry.  Psychiatric:  Not assessed   Nursing note and vitals reviewed.   Lab Results: BMP Recent Labs    10/05/18 0724 10/29/18 1235 10/30/18 0528  NA 138 136 137  K 3.7 4.5 4.1  CL 109 106 105  CO2 20* 18* 20*  GLUCOSE 102* 150* 102*  BUN 17 24* 25*  CREATININE 1.15* 1.39* 1.43*  CALCIUM 8.4* 8.8* 8.5*  GFRNONAA 59* 47* 46*  GFRAA >60 55* 53*    CBC Recent Labs  Lab 10/29/18 1235 10/30/18 0528  WBC 10.3 10.2  RBC 5.19* 4.85  HGB 12.8 12.1  HCT 41.1 36.8  PLT 216 202  MCV 79.2* 75.9*  MCH 24.7* 24.9*  MCHC 31.1 32.9  RDW 17.9* 17.2*  LYMPHSABS 1.5  --   MONOABS 0.8  --   EOSABS 0.0  --   BASOSABS 0.1  --     HEMOGLOBIN A1C Lab Results  Component Value Date   HGBA1C 5.8  (H) 08/09/2018   MPG 119.76 08/09/2018    Cardiac Panel (last 3 results) Recent Labs    10/29/18 1235 10/29/18 1607 10/30/18 0528  TROPONINI 0.23* 0.32* 0.62*    BNP (last 3 results) Recent Labs    08/13/18 0358 10/05/18 0724 10/29/18 1235  BNP 1,153.1* 789.4* 2,729.9*    TSH Recent Labs    08/09/18 1650 10/29/18 1607  TSH 0.492 0.646    Lipid Panel     Component Value Date/Time   CHOL 121 12/21/2014 0700   TRIG 66 12/21/2014 0700   HDL 53 12/21/2014 0700   CHOLHDL 2.3 12/21/2014 0700   VLDL 13 12/21/2014 0700   LDLCALC 55 12/21/2014 0700     Hepatic Function Panel Recent Labs    08/10/18 0303 08/23/18 0001 10/05/18 0724  PROT 6.0* 8.5* 6.8  ALBUMIN 2.7* 3.7 2.8*  AST 24 30 34  ALT 22 20 38  ALKPHOS 77 89 91  BILITOT 0.8 1.2 0.8    CARDIAC STUDIES:  EKG 10/29/2018: Sinus tachycardia 138 bpm.  Left atrial enlargement.  Old anteroseptal infarct.  Ventricular trigeminy.  Nonspecific ST-T changes.  Echocardiogram 08/10/2018: Mild LV dilatation.  EF 20-25%.  Global diffuse hypokinesis.  Restrictive physiology. Mild RV dilatation.  Moderate RV systolic dysfunction.  Severe left atrial dilatation.  Mild  right atrial dilatation. Severe tricuspid regurgitation. Severe pulmonary hypertension RVSP 66 mmHg. Trivial pericardial effusion   Assessment & Recommendations:  43 y.o. African-American female  with polysubstance abuse, hypertension, history of hyperthyroidism, bipolar disorder, schizophrenia, known systolic biventricular heart failure, severe pulmonary hypertension, admitted with acute on chronic systolic heart failure.  Acute on chronic systolic and diastolic, biventricular heart failure: Etiology of cardiomyopathy could be cocaine induced, hypertensive, or ischemic. Continue aggressive diuresis with IV Lasix 40 mg 3 times daily. Increase Bidil to 1 tab tid for afterload reduction. Her tachycardia and ventricular trigeminy is secondary  to heart failure decompensation. Perceived bradycardia on automated monitoring is due to PVC's.  Avoid beta blocker at this stage, given decompensated heart failure, as well as cocaine abue. Recommend daily weights and strict intake/output. 2 g sodium diet.  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  Recommend diuresis for now.  Hold off ACE/ARB/ARNI/aldosterone antagonist for now.  Troponin elevation: Likely type II MI due to heart failure exacerbation. She needs invasive coronary evaluation to rule out ischemic cardiomyopathy.  However, she has refused to undergo coronary angiography in the past.  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  Continue management as above.  Bipolar disorder, schizophrenia, polysubstance abuse, homelessness: These remain significant barriers for her care and likely precipitating her medication and diet noncompliance.  She will need assistance from Technical sales engineer. She remains at high risk for readmission.  Elder Negus, M.D. 10/30/2018, 4:49 PM [facility] Pager: [phone] Office: [phone] If no answer: [phone]

## 2018-10-30 NOTE — Progress Notes (Signed)
CCMD called that no data telemetry, pt telemetry box is off and refusing to apply again, advised CCMD pt refusal and paged MD to advise

## 2018-10-30 NOTE — Progress Notes (Signed)
Was paged about pt not wanting to wear her telemetry monitor.  Had also been notified earlier about patient refusing blood draw and EKG.  Pt had since allowed ekg to be performed.  When I spoke to the patient she was agreeable to wearing the monitor and allowing her blood to be drawn for troponin test..

## 2018-10-30 NOTE — Progress Notes (Signed)
Late entry- 0800 family med MD requested another scale weight as pt up 15lb from yesterday, I went to get weight with NT and she refused "my knees hurt and I alrleady been up" will attempt to get when pt goes to restroom, also ok to get ensure for pt per md

## 2018-10-30 NOTE — Progress Notes (Signed)
Order for stat ekg, pt refused, pt educated on needing to check heart " no I already told you. im congested and in 15 min"  Tried to explaina nd pt cut me off and told me 15 min. Pt lying in flat and no distress noted

## 2018-10-30 NOTE — Progress Notes (Signed)
Initial Nutrition Assessment  RD working remotely.  DOCUMENTATION CODES:   Not applicable  INTERVENTION:   -Continue Ensure Enlive po TID, each supplement provides 350 kcal and 20 grams of protein -MVI with minerals daily  NUTRITION DIAGNOSIS:   Predicted suboptimal nutrient intake related to social / environmental circumstances as evidenced by per patient/family report.  GOAL:   Patient will meet greater than or equal to 90% of their needs  MONITOR:   PO intake, Supplement acceptance, Labs, Weight trends, Skin, I & O's  REASON FOR ASSESSMENT:   Malnutrition Screening Tool    ASSESSMENT:   Christine Cobb is a 43 y.o. female presenting with worsening peripheral edema and dyspnea on exertion. PMH is significant for current every day smoker with history of COPD, HFrEF (EF 20-25%), HTN, Hyperthyroidism, bipolar mood disorder, schizoaffective disorder, and polysubstance abuse.   Pt admitted with CHF exacerbation.   Reviewed I/O's: -1.6 L x 24 hours  UOP: 2.6 L x 24 hours  Chart reviewed. Pt has been very irritable and has been speaking minimally to providers. She has also been refusing tests, care, and medication per RN notes.   Per MD notes, pt is homeless and has a history of noncompliance. Pt does not have a PCP and has been using the ED to obtain medication refills when she runs out.   Noted meal completion 75%. MD ordered Ensure supplements per pt request. RD suspect suboptimal oral intake related to food insecurity and homelessness.  Reviewed wt hx; noted wt has been stable over the past several years, however, suspect edema may be masking true weight loss. Given homelessness and healthcare barriers, pt is at risk for malnutrition.   Labs reviewed.   NUTRITION - FOCUSED PHYSICAL EXAM:    Most Recent Value  Orbital Region  Unable to assess  Upper Arm Region  Unable to assess  Thoracic and Lumbar Region  Unable to assess  Buccal Region  Unable to assess   Temple Region  Unable to assess  Clavicle Bone Region  Unable to assess  Clavicle and Acromion Bone Region  Unable to assess  Scapular Bone Region  Unable to assess  Dorsal Hand  Unable to assess  Patellar Region  Unable to assess  Anterior Thigh Region  Unable to assess  Posterior Calf Region  Unable to assess  Edema (RD Assessment)  Unable to assess  Hair  Unable to assess  Eyes  Unable to assess  Mouth  Unable to assess  Skin  Unable to assess  Nails  Unable to assess       Diet Order:   Diet Order            Diet Heart Room service appropriate? Yes; Fluid consistency: Thin  Diet effective now              EDUCATION NEEDS:   No education needs have been identified at this time  Skin:  Skin Assessment: Reviewed RN Assessment  Last BM:  10/30/18  Height:   Ht Readings from Last 1 Encounters:  10/29/18 5\' 4"  (1.626 m)    Weight:   Wt Readings from Last 1 Encounters:  10/30/18 68.6 kg    Ideal Body Weight:  54.5 kg  BMI:  Body mass index is 25.97 kg/m.  Estimated Nutritional Needs:   Kcal:  1700-1900  Protein:  90-105 grams  Fluid:  1.7-1.9 L    Christine Cobb A. Mayford Knife, RD, LDN, CDCES Registered Dietitian II Certified Diabetes Care and Education Specialist  Pager: [phone] After hours Pager: [phone]

## 2018-10-30 NOTE — Progress Notes (Signed)
Phlebotomy went to get labs and pt refused, pt still says she will do later, educated and still will not allow to get labs, paged md

## 2018-10-30 NOTE — Progress Notes (Signed)
Pt insisted to go for a shower. Informed that I'ts night but pt suddenly took her tele box and gown  off and went to shower. Monitored pt.

## 2018-10-30 NOTE — TOC Initial Note (Signed)
Transition of Care [facility]) - Initial/Assessment Note    Patient Details  Name: Christine Cobb MRN: [id number] Date of Birth: 1975/12/14  [facility] CM/SW Contact:    Reola Mosher Phone Number: [phone] 10/30/2018, 12:05 PM  Clinical Narrative:                 Patient stays in a shelter; CM talked to patient about having a PCP, she stated that she had one with the [facility]; has private insurance with [facility] Medicare; patient sleepy and did not want to talk to the CM any more. CM will continue to follow for progression of care.  Expected Discharge Plan: Homeless Shelter Barriers to Discharge: No Barriers Identified   Patient Goals and CMS Choice Patient states their goals for this hospitalization and ongoing recovery are:: to be left alone CMS Medicare.gov Compare Post Acute Care list provided to:: Patient Choice offered to / list presented to : NA  Expected Discharge Plan and Services Expected Discharge Plan: Homeless Shelter   Discharge Planning Services: CM Consult Post Acute Care Choice: NA Living arrangements for the past 2 months: No permanent address                   DME Agency: NA       HH Arranged: NA HH Agency: NA        Prior Living Arrangements/Services Living arrangements for the past 2 months: No permanent address Lives with:: Self Patient language and need for interpreter reviewed:: Yes Do you feel safe going back to the place where you live?: Yes      Need for Family Participation in Patient Care: No (Comment) Care giver support system in place?: No (comment)   Criminal Activity/Legal Involvement Pertinent to Current Situation/Hospitalization: No - Comment as needed  Activities of Daily Living Home Assistive Devices/Equipment: None ADL Screening (condition at time of admission) Patient's cognitive ability adequate to safely complete daily activities?: Yes Is the patient deaf or have difficulty hearing?:  No Does the patient have difficulty seeing, even when wearing glasses/contacts?: No Does the patient have difficulty concentrating, remembering, or making decisions?: Yes Patient able to express need for assistance with ADLs?: No Does the patient have difficulty dressing or bathing?: Yes Independently performs ADLs?: No Communication: Independent Dressing (OT): Independent Grooming: Independent Feeding: Independent Bathing: Independent Toileting: Independent In/Out Bed: Independent Walks in Home: Independent Does the patient have difficulty walking or climbing stairs?: Yes Weakness of Legs: Both Weakness of Arms/Hands: None  Permission Sought/Granted Permission sought to share information with : Case Manager                Emotional Assessment Appearance:: Appears older than stated age Attitude/Demeanor/Rapport: Lethargic Affect (typically observed): Stoic Orientation: : Oriented to Self, Oriented to  Time, Oriented to Place, Oriented to Situation Alcohol / Substance Use: Alcohol Use, Illicit Drugs Psych Involvement: No (comment)  Admission diagnosis:  Acute on chronic combined systolic and diastolic congestive heart failure (HCC) [I50.43] Patient Active Problem List   Diagnosis Date Noted  . CHF exacerbation (HCC) 10/29/2018  . Malnutrition of moderate degree 08/11/2018  . Hemoptysis 08/09/2018  . Respiratory failure (HCC)   . Chest pain   . Community acquired pneumonia of right lower lobe of lung (HCC)   . Bipolar affective disorder, manic, severe (HCC) 04/30/2017  . Severe bipolar affective disorder with psychosis (HCC) 04/29/2017  . Cocaine abuse with cocaine-induced mood disorder (HCC) 06/02/2016  . Schizoaffective disorder, bipolar type (HCC)   .  Bipolar disorder, current episode manic without psychotic features, severe (HCC)   . Agitation   . Manic behavior (HCC)   . Hyperthyroidism 09/21/2013  . Marijuana abuse 04/12/2013  . History of CHF (congestive heart  failure) 02/01/2013  . Abscess of abdominal wall 10/18/2012  . HTN (hypertension) 04/08/2011  . Tachycardia 04/08/2011   PCP:  Patient, No Pcp Per Pharmacy:   [facility] [address] [place], [place] - [address] [place] [address] Phone: [phone] Fax: [phone]  [id number] - [place], [place] - [address] AT [facility] [address] [place] [address] Phone: [phone] Fax: [phone]     Social Determinants of Health (SDOH) Interventions    Readmission Risk Interventions No flowsheet data found.

## 2018-10-30 NOTE — Progress Notes (Signed)
Patients HR in 120s-130s most of the night, still goes to 150s with exertion. Now back in 150s. Family medicine MD paged.

## 2018-10-30 NOTE — Progress Notes (Signed)
CCMD called that pt is off telemetry, pt was in shower, called CCMD to advise and will place when out

## 2018-10-31 ENCOUNTER — Encounter (HOSPITAL_COMMUNITY): Payer: Self-pay | Admitting: Radiology

## 2018-10-31 ENCOUNTER — Inpatient Hospital Stay (HOSPITAL_COMMUNITY): Payer: Medicare HMO

## 2018-10-31 DIAGNOSIS — R000 Tachycardia, unspecified: Secondary | ICD-10-CM

## 2018-10-31 DIAGNOSIS — F250 Schizoaffective disorder, bipolar type: Secondary | ICD-10-CM

## 2018-10-31 DIAGNOSIS — N179 Acute kidney failure, unspecified: Secondary | ICD-10-CM

## 2018-10-31 LAB — BASIC METABOLIC PANEL
Anion gap: 9 (ref 5–15)
BUN: 26 mg/dL — ABNORMAL HIGH (ref 6–20)
CO2: 29 mmol/L (ref 22–32)
Calcium: 8.1 mg/dL — ABNORMAL LOW (ref 8.9–10.3)
Chloride: 97 mmol/L — ABNORMAL LOW (ref 98–111)
Creatinine, Ser: 1.14 mg/dL — ABNORMAL HIGH (ref 0.44–1.00)
GFR calc Af Amer: >60 mL/min (ref >60)
GFR calc non Af Amer: >60 mL/min (ref >60)
Glucose, Bld: 115 mg/dL — ABNORMAL HIGH (ref 70–99)
Potassium: 3.8 mmol/L (ref 3.5–5.1)
Sodium: 135 mmol/L (ref 135–145)

## 2018-10-31 LAB — PREGNANCY, URINE: Preg Test, Ur: NEGATIVE

## 2018-10-31 MED ORDER — ARIPIPRAZOLE 5 MG PO TABS
10.0000 mg | ORAL_TABLET | Freq: Every day | ORAL | Status: DC
  Administered 2018-10-31 – 2018-11-05 (×6): 10 mg via ORAL
  Filled 2018-10-31 (×6): qty 2

## 2018-10-31 MED ORDER — MELATONIN 3 MG PO TABS
3.0000 mg | ORAL_TABLET | Freq: Every evening | ORAL | Status: DC | PRN
  Administered 2018-10-31 – 2018-11-03 (×3): 3 mg via ORAL
  Filled 2018-10-31 (×5): qty 1

## 2018-10-31 MED ORDER — AMANTADINE HCL 100 MG PO CAPS
100.0000 mg | ORAL_CAPSULE | Freq: Two times a day (BID) | ORAL | Status: DC
  Administered 2018-10-31 – 2018-11-15 (×31): 100 mg via ORAL
  Filled 2018-10-31 (×32): qty 1

## 2018-10-31 MED ORDER — BENZONATATE 100 MG PO CAPS
200.0000 mg | ORAL_CAPSULE | Freq: Three times a day (TID) | ORAL | Status: DC | PRN
  Administered 2018-10-31 – 2018-11-09 (×3): 200 mg via ORAL
  Filled 2018-10-31 (×6): qty 2

## 2018-10-31 MED ORDER — IOHEXOL 350 MG/ML SOLN
80.0000 mL | Freq: Once | INTRAVENOUS | Status: AC | PRN
  Administered 2018-10-31: 80 mL via INTRAVENOUS

## 2018-10-31 MED ORDER — SPIRONOLACTONE 25 MG PO TABS
25.0000 mg | ORAL_TABLET | Freq: Every day | ORAL | Status: DC
  Filled 2018-10-31 (×3): qty 1

## 2018-10-31 MED ORDER — IVABRADINE HCL 5 MG PO TABS: 2.5000 mg | ORAL_TABLET | Freq: Two times a day (BID) | ORAL | Status: DC

## 2018-10-31 MED ORDER — DIGOXIN 125 MCG PO TABS
0.1250 mg | ORAL_TABLET | Freq: Every day | ORAL | Status: DC
  Administered 2018-10-31 – 2018-11-01 (×2): 0.125 mg via ORAL
  Filled 2018-10-31 (×3): qty 1

## 2018-10-31 MED ORDER — HALOPERIDOL DECANOATE 100 MG/ML IM SOLN
100.0000 mg | Freq: Once | INTRAMUSCULAR | Status: AC
  Administered 2018-11-02: 100 mg via INTRAMUSCULAR
  Filled 2018-10-31: qty 1

## 2018-10-31 NOTE — Progress Notes (Signed)
   Vital Signs MEWS/VS Documentation      10/31/2018 0700 10/31/2018 0806 10/31/2018 0900 10/31/2018 1233   MEWS Score:  3  3  3  4    MEWS Score Color:  Yellow  Yellow  Yellow  Red   Resp:  -  20  -  (!) 24   Pulse:  -  (!) 140  -  (!) 138   BP:  -  (!) 121/97  -  114/60   Temp:  -  98.9 F (37.2 C)  -  99.9 F (37.7 C)   O2 Device:  -  Room Air  -  Room Air   Level of Consciousness:  -  -  Alert  -       MD aware of respirations    Jeanella Flattery 10/31/2018,12:43 PM

## 2018-10-31 NOTE — Progress Notes (Signed)
ANTICOAGULATION CONSULT NOTE - Initial Consult  Pharmacy Consult for heparin Indication: pulmonary embolus  Allergies  Allergen Reactions  . Amoxil [Amoxicillin] Anaphylaxis    Did it involve swelling of the face/tongue/throat, SOB, or low BP? yes Did it involve sudden or severe rash/hives, skin peeling, or any reaction on the inside of your mouth or nose? yes Did you need to seek medical attention at a hospital or doctor's office? unknown When did it last happen?unknoen If all above answers are "NO", may proceed with cephalosporin use.   Marland Kitchen Hydroxyzine     Other reaction(s): Bleeding  . Ketorolac Tromethamine Hives and Swelling  . Prenatal [B-Plex Plus] Nausea Only  . Tegretol [Carbamazepine] Other (See Comments)    Swelling, skin peeling, skin discoloration  . B-Plex Plus Nausea Only    Prenatal   . Tegretol [Carbamazepine] Swelling and Other (See Comments)    Skin peeling Skin discoloring     Patient Measurements: Height: 5\' 4"  (162.6 cm) Weight: 146 lb 4.8 oz (66.4 kg)(C scale) IBW/kg (Calculated) : 54.7  Vital Signs: Temp: 99.6 F (37.6 C) (05/05 2334) Temp Source: Oral (05/05 2334) BP: 128/75 (05/05 2334) Pulse Rate: 190 (05/05 2334)  Labs: Recent Labs    10/29/18 1235 10/29/18 1607 10/30/18 0528 10/30/18 1645 10/31/18 1436  HGB 12.8  --  12.1  --   --   HCT 41.1  --  36.8  --   --   PLT 216  --  202  --   --   CREATININE 1.39*  --  1.43*  --  1.14*  TROPONINI 0.23* 0.32* 0.62* 0.65*  --     Estimated Creatinine Clearance: 61.5 mL/min (A) (by C-G formula based on SCr of 1.14 mg/dL (H)).   Medical History: Past Medical History:  Diagnosis Date  . Arm pain   . Bipolar affective disorder, currently manic, mild (HCC)   . CHF (congestive heart failure) (HCC)   . Eye globe prosthesis   . HTN (hypertension)   . Hyperthyroidism   . Sinus tachycardia     Medications:  Medications Prior to Admission  Medication Sig Dispense Refill Last Dose  .  albuterol (PROVENTIL HFA;VENTOLIN HFA) 108 (90 Base) MCG/ACT inhaler Inhale 2 puffs into the lungs every 4 (four) hours as needed for wheezing or shortness of breath. (Patient not taking: Reported on 10/29/2018) 1 Inhaler 3 Not Taking at Unknown time  . digoxin (LANOXIN) 0.125 MG tablet Take 1 tablet (0.125 mg total) by mouth daily. (Patient not taking: Reported on 10/29/2018) 30 tablet 0 Not Taking at Unknown time  . furosemide (LASIX) 20 MG tablet Take 1 tablet (20 mg total) by mouth daily as needed for fluid or edema. (Patient not taking: Reported on 10/29/2018) 30 tablet 0 Not Taking at Unknown time   Scheduled:  . amantadine  100 mg Oral BID  . ARIPiprazole  10 mg Oral Daily  . digoxin  0.125 mg Oral Daily  . feeding supplement (ENSURE ENLIVE)  237 mL Oral TID BM  . [START ON 11/02/2018] haloperidol decanoate  100 mg Intramuscular Once  . isosorbide-hydrALAZINE  1 tablet Oral TID  . multivitamin with minerals  1 tablet Oral Daily  . nicotine  7 mg Transdermal Daily  . spironolactone  25 mg Oral Daily    Assessment: 43yo female admitted 5/3 w/ CHF exacerbation, cont'd to experience tachycardia, hemoptysis, and CP despite successful diuresis >> CT reveals small to medium-sized bilateral lower lobe pulmonary emboli; situation is complex due to  fact that pt has been refusing both lab sticks as well as LMWH DVT Px injections; plan for now is to start heparin infusion which will require frequent lab sticks at first with at least daily lab monitoring at steady state; could consider treatment dose LMWH at once-daily dosing to minimize sticks if patient refuses too many lab sticks.  Goal of Therapy:  Heparin level 0.3-0.7 units/ml Monitor platelets by anticoagulation protocol: Yes   Plan:  Last received Lovenox 40mg  on 5/3; will give heparin 5000 units IV bolus x1 followed by gtt at 1200 units/hr and monitor heparin levels and CBC.  Vernard Gambles, PharmD, BCPS  10/31/2018,11:59 PM

## 2018-10-31 NOTE — Progress Notes (Signed)
Pt HR sustaining in the 150s. MD paged. Will continue to monitor pt

## 2018-10-31 NOTE — Progress Notes (Addendum)
Family Medicine Teaching Service Daily Progress Note Intern Pager: [phone]  Patient name: Christine Cobb Medical record number: [id number] Date of birth: 1976-03-26 Age: 43 y.o. Gender: female  Primary Care Provider: Patient, No Pcp Per  Consultants: Cardiology Code Status: Full  Pt Overview and Major Events to Date:  5/3 Admitted to FPTS, CXR R>L pulmonary effusion, RUL opacity  Assessment and Plan: Christine Cobb is a 43 y.o. female presenting with worsening peripheral edema and dyspnea on exertion. PMH is significant for current every day smoker with history of COPD, HFrEF (EF 20-25%), HTN, Hyperthyroidism, bipolar mood disorder, schizoaffective disorder, and polysubstance abuse.   CHF Exacerbation  Anasarca  HFrEF: improving 3.4L output overnight with decrease in weight (69.2>66.4kg). Anasarca improved overall: Facial and UE swelling has improved with 2+ pitting edema to knee with only trace edema at thigh level. Tachycardia has improved overnight, 130's this AM. Patient remained hemodynamcially stable on RA and afebrile overnight. Improvement in SOB per patient. Tachycardia stable but EKG improved with sinus tachycardia. - Per cards: continue agressive diuresis (IV lasix 40mg  TID), increase Bidil 20-37.5 TID, add Digoxin 0.125mg  without loading to help with HR, follow up recs  - Per cards, do NOT continue Digoxin at discharge - monitor HR - strict I/O's, daily weights - cardiac monitoring, pulse ox - vital per floor - heart healthy, 2g sodium diet - avoid beta blockers, nonhydro CCB's  Tachycardia: Elevated HR to max 170 overnight, improved to 130's this AM. Repeat EKG overnight appears improved with sinus tachycardia. Expect likely 2/2 to inceased demand and poor contractility from HF decompensation. However, given endorsement of hemoptysis on admission with cough (although appears chronic), mild chest pain, LE edema, tachycardia and SOB, there is concern for PE.  Wells score 8.5.  - Will obtain CTA to rule out PE   - Per radiology, given acute on CKD, will provide reduced dose of IV contrast - will hold one dose of lasix for kidney function  - obtain stat urine pregnancy test - Per cards, start Digoxin 0.125 mg QD - avoid BB's, nonhydro CCBs - continue to trend HR with fluid status  Elevated Troponin: stable Troponin leveled off 0.23>0.32>0.62>0.65. Endorses mild left sided chest pain that has improved since admission. Repeat EKGs with sinus tachycardia without ST changes. Most likely demand ischemia from decompensated HF and poor clearance due to AKI - Per cards, pt needs invasive coronary evaluation to r/o ischemia cardiomyopathy. Pt has refused coronary angiography in past - follow up cards recs  - will no longer trend trops, continue to monitor  HTN:  BP normotensive overnight, 117/88 this AM.  - Per cards, avoid ACE/ARB/ARNI/Aldosterone antagonist for now  Acute on CKD: Likely cardiorenal syndrome. Patient refused labs this AM. Will attempt to obtain again later this AM.  - avoid nephrotoxic meds including ACE/ARB/ARNI/Aldosterone antagonist for now - daily BMP  COPD: Home meds: Albuterol PRN.  No wheezing appreciated on exam. - LAMA  Polysubstance Abuse: Current everyday tobacco user (~3 cigarettes per day). History of cocaine and marijuana use, last used 2-3 days. UDS positive for cocaine on admission. Discussed at length the extreme harm that these drugs are for her underlying diseases, particularly her heart. She voiced understanding and desire to stop. - avoid BB given history - Nicotine patch - continue to encourage cessation  Psychiatric History: History of severe bipolar disorder, MDD, agitation, anxiety, and schizoaffective disorder. Many ED visits associated with psych history. Patient poor historian. Per chart review, past psych meds include:  Aripiprazole 882mg  injection q50months, PO Abilify 30mg  QD, Depakote 750mg  QD,  Vistaril 50mg  for anxiety, Amantadine 100mg  BID for agitation, Mirtazapine 15mg  for MDD. Curerntly not on any medications. Notes last manic episode ~4 months ago. Appears she was IVC'd in January 2019 at [facility]. Currently denies any manic type behavior, SI/HI, visual/audio hallucinations.  - Continue to monitor - Psych consulted, will follow up recs  Homelessness  No PCP: Patient notes lived on [address] until ~3 months ago when her significant other passed away, in which she no longer could afford her house. She has been homeless since and stays at "different people's" houses, "wherever she can find to sleep". She has no family close by. She has a daughter in Social worker in town, which has custody of her son. She is unsure if she is able to stay there. She notes she sees PCP at [facility], last seen in February. However, per their note, they are not her PCP. She notes she is unsure if they are on the [employer], if not she is unsure if she will be able to follow up easily. Long discussion with patient involving importance of close follow up and med refill in regards to danger of her health and further heart damage. She voices understanding of the severity of her disease. She is open to talking to CSW to discuss community resources. Plan at discharge is to stay at homeless shelter. - CSW consulted, will follow up - Case management consulted, will follow up  FEN/GI: heart healthy diet PPx: Lovenox  Disposition: Pending improvement  Subjective:  Patient a lot more calm and delightful today. She notes her SOB is a little better. Only very little sharp left sided chest pain that has continued to improve daily. She is open to talking to CSW for resources. Unsure if she has anyone to stay with. She only has her daughter in law in town and not sure if she can stay there. She notes that things have been hard since her significant other (father of her son) passed away 3 months ago.  Denies  headache or vision changes. Denies any other concerns or complaints this AM.  Objective: Temp:  [98.9 F (37.2 C)-99.8 F (37.7 C)] 98.9 F (37.2 C) (05/05 0806) Pulse Rate:  [136-170] 140 (05/05 0806) Resp:  [19-28] 20 (05/05 0806) BP: (117-132)/(86-97) 121/97 (05/05 0806) SpO2:  [94 %-97 %] 97 % (05/05 0806) Weight:  [66.4 kg-69.2 kg] 66.4 kg (05/05 0245) Physical Exam: General: calm and pleasant, well nourished, well developed, in no acute distress with non-toxic appearance, sitting up ordering breakfast this AM HEENT: normocephalic, atraumatic, moist mucous membranes Neck: supple, normal ROM CV: regular rhythm but tachycardic, intact distal pulses, 2+ pitting edema to knee, trace edema at thighs bilaterally, swelling minimal at face, abdominal distention improved Lungs: RLL crackles, normal work of breathing on RA, normal effort, no wheezes Abdomen: soft, non-tender, less distended from day prior, normoactive bowel sounds Skin: warm, dry Extremities: warm and well perfused Neuro: Alert and oriented, speech normal  Laboratory: Recent Labs  Lab 10/29/18 1235 10/30/18 0528  WBC 10.3 10.2  HGB 12.8 12.1  HCT 41.1 36.8  PLT 216 202   Recent Labs  Lab 10/29/18 1235 10/30/18 0528  NA 136 137  K 4.5 4.1  CL 106 105  CO2 18* 20*  BUN 24* 25*  CREATININE 1.39* 1.43*  CALCIUM 8.8* 8.5*  GLUCOSE 150* 102*    BNP: 2729 Troponin 0.23>0.32>0.62 COVID: negative TSH: 0.646 T4:  1.50 T3: 2.0  Imaging/Diagnostic Tests: Dg Chest 2 View  Result Date: 10/29/2018 CLINICAL DATA:  Shortness of breath for 4 days EXAM: CHEST - 2 VIEW COMPARISON:  10/05/2018 FINDINGS: Chronic cardiomegaly. Small pleural effusions with lower lobe opacity greater on the right. A similar appearance was seen previously. There is a right upper lobe opacity outlining the minor fissure, convincing even when accounting for an overlapping EKG lead. No pneumothorax. IMPRESSION: 1. Right upper lobe opacity that  is focal and suspicious for pneumonia. 2. Cardiomegaly with right more than left pleural effusion and lower lobe atelectasis/opacification. Electronically Signed   By: Marnee Spring M.D.   On: 10/29/2018 13:52    Joana Reamer, DO 10/31/2018, 12:19 PM PGY-1, [facility] Family Medicine FPTS Intern pager: [phone], text pages welcome

## 2018-10-31 NOTE — Progress Notes (Addendum)
Tele called stating pt leads are off. Upon checking pt is in the bathroom taking a shower. Pt is non compliant despite of informing her not safe to get out the bed with her current heart rate. Still pt refusing bed alarm on.    0230 pt jump in a shower again. Will monitor.   2703 again pt went to shower oppose to nurse advice not to. In total, she took a shower 4x.

## 2018-10-31 NOTE — Plan of Care (Signed)
  Problem: Activity: Goal: Capacity to carry out activities will improve Outcome: Progressing   Problem: Cardiac: Goal: Ability to achieve and maintain adequate cardiopulmonary perfusion will improve Outcome: Progressing   Problem: Clinical Measurements: Goal: Will remain free from infection Outcome: Progressing Goal: Diagnostic test results will improve Outcome: Progressing Goal: Respiratory complications will improve Outcome: Progressing Goal: Cardiovascular complication will be avoided Outcome: Progressing   Problem: Activity: Goal: Risk for activity intolerance will decrease Outcome: Progressing   Problem: Nutrition: Goal: Adequate nutrition will be maintained Outcome: Progressing   Problem: Education: Goal: Ability to verbalize understanding of medication therapies will improve Outcome: Progressing   Problem: Activity: Goal: Capacity to carry out activities will improve Outcome: Progressing   Problem: Cardiac: Goal: Ability to achieve and maintain adequate cardiopulmonary perfusion will improve Outcome: Progressing

## 2018-10-31 NOTE — Progress Notes (Signed)
Patient refused labs this morning, will try again later.

## 2018-10-31 NOTE — Progress Notes (Addendum)
Pt refusing to put tele monitor on. Pt also continues to get into the shower every hour. Pt educated and still refusing. MD paged. Will continue to monitor pt.

## 2018-10-31 NOTE — Progress Notes (Signed)
Pt refusing oxygen.  

## 2018-10-31 NOTE — Progress Notes (Signed)
Pt HR has went up to 200 now sustaining 190s. MD paged. Will continue to monitor.

## 2018-10-31 NOTE — Progress Notes (Signed)
Pt refusing heparin drip. MD paged. Will continue to monitor pt.

## 2018-10-31 NOTE — Progress Notes (Signed)
Paged that patient with HR to 200 that quickly decreased to 170s.  EKG ordered.  Appears sinus tachycardia on my read with visible p waves and rate 160.  Throughout the day, patient's charted HR max was 158. Cardiology had noted that HR would continue to improve with diuresis.  Should patient stay consistently 170 or higher, will reach out to cardiology for further recommendations.  Luis Abed, D.O.  PGY-1 Family Medicine  10/31/2018 12:11 AM
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
## 2018-10-31 NOTE — Progress Notes (Signed)
Pharmacist Heart Failure Core Measure Documentation  Assessment: Christine Cobb has an EF documented as 20-25% on 08/10/18 by TTE.  Rationale: Heart failure patients with left ventricular systolic dysfunction (LVSD) and an EF < 40% should be prescribed an angiotensin converting enzyme inhibitor (ACEI) or angiotensin receptor blocker (ARB) at discharge unless a contraindication is documented in the medical record.  This patient is not currently on an ACEI or ARB for HF.  This note is being placed in the record in order to provide documentation that a contraindication to the use of these agents is present for this encounter.  ACE Inhibitor or Angiotensin Receptor Blocker is contraindicated (specify all that apply)  []   ACEI allergy AND ARB allergy []   Angioedema []   Moderate or severe aortic stenosis []   Hyperkalemia []   Hypotension []   Renal artery stenosis [x]   Worsening renal function, preexisting renal disease or dysfunction   Danae Orleans, PharmD PGY1 Pharmacy Resident Phone [phone] 10/31/2018       2:35 PM

## 2018-10-31 NOTE — Plan of Care (Signed)
°  Problem: Education: °Goal: Ability to demonstrate management of disease process will improve °Outcome: Progressing °  °Problem: Education: °Goal: Ability to verbalize understanding of medication therapies will improve °Outcome: Progressing °  °Problem: Activity: °Goal: Capacity to carry out activities will improve °Outcome: Progressing °  °

## 2018-10-31 NOTE — Progress Notes (Signed)
Pt refusing bed alarm.

## 2018-10-31 NOTE — Progress Notes (Signed)
Called phlebotomy to confirm that they would draw labs again.

## 2018-10-31 NOTE — Progress Notes (Signed)
Patient refuses to put on heart monitor, patient educated and still refuses. Says she will put it on in a little bit because she needs a break.

## 2018-10-31 NOTE — Progress Notes (Signed)
Patient still refusing labs and also refusing to put on cardiac monitoring box.

## 2018-10-31 NOTE — Progress Notes (Signed)
Pt heart rate Up to 200 , stayed at that rate for few minutes. upon assessment pt sitting at the edge of the bed, complains lightheadedness. VS taken. Paged MD.  HR sustained At 160's-170's. EKG was taken as per order. Pt hr down to 140's. Will monitor

## 2018-10-31 NOTE — Progress Notes (Addendum)
Subjective:  Continues to have shortness of breath   Patient has been sleeping at every visit. Refuses to answer my questions.  Episodes of tachycardia overnight. Refuses to wear monitor or draw labs.   Objective:  Vital Signs in the last 24 hours: Temp:  [98.9 F (37.2 C)-99.8 F (37.7 C)] 98.9 F (37.2 C) (05/05 0806) Pulse Rate:  [136-170] 140 (05/05 0806) Resp:  [19-28] 20 (05/05 0806) BP: (107-132)/(79-97) 121/97 (05/05 0806) SpO2:  [94 %-98 %] 97 % (05/05 0806) Weight:  [66.4 kg-69.2 kg] 66.4 kg (05/05 0245)  Intake/Output from previous day: 05/04 0701 - 05/05 0700 In: 1490 [P.O.:1490] Out: 3450 [Urine:3450]  Physical Exam  Constitutional: She appears well-developed and well-nourished. No distress.  HENT:  Head: Normocephalic and atraumatic.  Eyes:  Could not be assessed  Neck: JVD present.  Cardiovascular: Regular rhythm and intact distal pulses. Tachycardia present. Exam reveals gallop.  No murmur heard. Pulmonary/Chest: Effort normal and breath sounds normal. No respiratory distress. She has no wheezes. She has no rales.  Abdominal: Soft. Bowel sounds are normal. There is no rebound.  Musculoskeletal:        General: Edema (3+ n/l) present.  Lymphadenopathy:    She has no cervical adenopathy.  Neurological: She is alert. No cranial nerve deficit.  Arousable, but refuses to talk to me  Skin: Skin is warm and dry.  Psychiatric:  Not assessed   Nursing note and vitals reviewed.   Lab Results: BMP Recent Labs    10/05/18 0724 10/29/18 1235 10/30/18 0528  NA 138 136 137  K 3.7 4.5 4.1  CL 109 106 105  CO2 20* 18* 20*  GLUCOSE 102* 150* 102*  BUN 17 24* 25*  CREATININE 1.15* 1.39* 1.43*  CALCIUM 8.4* 8.8* 8.5*  GFRNONAA 59* 47* 46*  GFRAA >60 55* 53*    CBC Recent Labs  Lab 10/29/18 1235 10/30/18 0528  WBC 10.3 10.2  RBC 5.19* 4.85  HGB 12.8 12.1  HCT 41.1 36.8  PLT 216 202  MCV 79.2* 75.9*  MCH 24.7* 24.9*  MCHC 31.1 32.9  RDW 17.9*  17.2*  LYMPHSABS 1.5  --   MONOABS 0.8  --   EOSABS 0.0  --   BASOSABS 0.1  --     HEMOGLOBIN A1C Lab Results  Component Value Date   HGBA1C 5.8 (H) 08/09/2018   MPG 119.76 08/09/2018    Cardiac Panel (last 3 results) Recent Labs    10/29/18 1607 10/30/18 0528 10/30/18 1645  TROPONINI 0.32* 0.62* 0.65*    BNP (last 3 results) Recent Labs    08/13/18 0358 10/05/18 0724 10/29/18 1235  BNP 1,153.1* 789.4* 2,729.9*    TSH Recent Labs    08/09/18 1650 10/29/18 1607  TSH 0.492 0.646    Lipid Panel     Component Value Date/Time   CHOL 121 12/21/2014 0700   TRIG 66 12/21/2014 0700   HDL 53 12/21/2014 0700   CHOLHDL 2.3 12/21/2014 0700   VLDL 13 12/21/2014 0700   LDLCALC 55 12/21/2014 0700     Hepatic Function Panel Recent Labs    08/10/18 0303 08/23/18 0001 10/05/18 0724  PROT 6.0* 8.5* 6.8  ALBUMIN 2.7* 3.7 2.8*  AST 24 30 34  ALT 22 20 38  ALKPHOS 77 89 91  BILITOT 0.8 1.2 0.8    CARDIAC STUDIES:  EKG 10/29/2018: Sinus tachycardia 138 bpm.  Left atrial enlargement.  Old anteroseptal infarct.  Ventricular trigeminy.  Nonspecific ST-T changes.  Echocardiogram 08/10/2018: Mild  LV dilatation.  EF 20-25%.  Global diffuse hypokinesis.  Restrictive physiology. Mild RV dilatation.  Moderate RV systolic dysfunction.  Severe left atrial dilatation.  Mild right atrial dilatation. Severe tricuspid regurgitation. Severe pulmonary hypertension RVSP 66 mmHg. Trivial pericardial effusion   Assessment & Recommendations:  43 y.o. African-American female  with polysubstance abuse, hypertension, history of hyperthyroidism, bipolar disorder, schizophrenia, known systolic biventricular heart failure, severe pulmonary hypertension, admitted with acute on chronic systolic heart failure.  Acute on chronic systolic and diastolic, biventricular heart failure: Etiology of cardiomyopathy could be cocaine induced, hypertensive, or ischemic. Continue aggressive  diuresis with IV Lasix 40 mg 3 times daily. Increased Bidil to 1 tab tid for afterload reduction. Avoid beta blocker at this stage, given decompensated heart failure, as well as cocaine abue. Recommend daily weights and strict intake/output. 2 g sodium diet. Add digoxin 0.125 mg daily without loading to aid heart rate control, while inpatient. Do not recommend this medication on discharge.  Tachycardia: Predominantly sinus tachycardia, secondary to heart failure decompensation.  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  Recommend diuresis for now.  Hold off ACE/ARB/ARNI/aldosterone antagonist for now.  Troponin elevation: Likely type II MI due to heart failure exacerbation. She needs invasive coronary evaluation to rule out ischemic cardiomyopathy.  However, she has refused to undergo coronary angiography in the past.  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  Continue management as above.  Bipolar disorder, schizophrenia, polysubstance abuse, homelessness: These remain significant barriers for her care and likely precipitating her medication and diet noncompliance.  She will need assistance from Technical sales engineer. She remains at high risk for readmission.  She remains extremely decompensated from heart failure standpoint. Her somnolence could be due to combination of underlying heart failure decompensation, low output state, as well as underlying psychiatric disorder. Unfortunately, she is not a candidate for advanced heart failure therapy due to noncompliance, psychiatric illness, and polysubstance abuse. Her prognosis is guarded. I am not sure if patient is competent to make her decisions. Recommend psychiatry consult.   Elder Negus, M.D. 10/31/2018, 11:08 AM [facility] Pager: [phone] Office: [phone] If no answer: [phone]

## 2018-11-01 DIAGNOSIS — J984 Other disorders of lung: Secondary | ICD-10-CM

## 2018-11-01 DIAGNOSIS — Z008 Encounter for other general examination: Secondary | ICD-10-CM

## 2018-11-01 DIAGNOSIS — F1910 Other psychoactive substance abuse, uncomplicated: Secondary | ICD-10-CM

## 2018-11-01 DIAGNOSIS — J189 Pneumonia, unspecified organism: Secondary | ICD-10-CM

## 2018-11-01 LAB — BASIC METABOLIC PANEL
Anion gap: 15 (ref 5–15)
BUN: 17 mg/dL (ref 6–20)
CO2: 26 mmol/L (ref 22–32)
Calcium: 8.1 mg/dL — ABNORMAL LOW (ref 8.9–10.3)
Chloride: 91 mmol/L — ABNORMAL LOW (ref 98–111)
Creatinine, Ser: 1.16 mg/dL — ABNORMAL HIGH (ref 0.44–1.00)
GFR calc Af Amer: >60 mL/min (ref >60)
GFR calc non Af Amer: 59 mL/min — ABNORMAL LOW (ref >60)
Glucose, Bld: 138 mg/dL — ABNORMAL HIGH (ref 70–99)
Potassium: 3.9 mmol/L (ref 3.5–5.1)
Sodium: 132 mmol/L — ABNORMAL LOW (ref 135–145)

## 2018-11-01 MED ORDER — HEPARIN (PORCINE) 25000 UT/250ML-% IV SOLN
1200.0000 [IU]/h | INTRAVENOUS | Status: DC
  Administered 2018-11-01: 1200 [IU]/h via INTRAVENOUS
  Filled 2018-11-01 (×2): qty 250

## 2018-11-01 MED ORDER — FUROSEMIDE 10 MG/ML IJ SOLN
40.0000 mg | Freq: Three times a day (TID) | INTRAMUSCULAR | Status: DC
  Administered 2018-11-01 – 2018-11-02 (×3): 40 mg via INTRAVENOUS
  Filled 2018-11-01 (×3): qty 4

## 2018-11-01 MED ORDER — CARVEDILOL 3.125 MG PO TABS
3.1250 mg | ORAL_TABLET | Freq: Two times a day (BID) | ORAL | Status: DC
  Filled 2018-11-01: qty 1

## 2018-11-01 MED ORDER — HEPARIN BOLUS VIA INFUSION
5000.0000 [IU] | Freq: Once | INTRAVENOUS | Status: AC
  Administered 2018-11-01: 5000 [IU] via INTRAVENOUS
  Filled 2018-11-01: qty 5000

## 2018-11-01 MED ORDER — RIVAROXABAN 15 MG PO TABS
15.0000 mg | ORAL_TABLET | Freq: Two times a day (BID) | ORAL | Status: DC
  Administered 2018-11-01 – 2018-11-15 (×27): 15 mg via ORAL
  Filled 2018-11-01 (×30): qty 1

## 2018-11-01 NOTE — Progress Notes (Signed)
Subjective:  Patient states that she does not want to wake up, kept her eyes closed when I visited her.  States that she doesn't feel well, but does not give any specifics.  Intake/Output from previous day:  I/O last 3 completed shifts: In: 2204 [P.O.:2204] Out: 5100 [Urine:5100] No intake/output data recorded.  Blood pressure (!) 121/93, pulse (!) 145, temperature 99.5 F (37.5 C), temperature source Oral, resp. rate 20, height 5' 4"  (1.626 m), weight 62.4 kg, last menstrual period 09/13/2018, SpO2 98 %. Physical Exam  Constitutional: She appears well-developed and well-nourished.  Appears older than stated age  Eyes: Conjunctivae are normal.  Neck: Neck supple.  Cardiovascular: Tachycardia present. Exam reveals gallop.  No murmur heard. Pulmonary/Chest: Effort normal.  Bronchial breath sounds right upper lobe, decreased breath sounds at the bases, scattered crackles, soft.  Abdominal: Soft. Bowel sounds are normal.  Musculoskeletal:        General: Edema (2+ pitting) present.  Skin: Skin is warm and dry.    Lab Results: BMP BNP (last 3 results) Recent Labs    08/13/18 0358 10/05/18 0724 10/29/18 1235  BNP 1,153.1* 789.4* 2,729.9*    ProBNP (last 3 results) No results for input(s): PROBNP in the last 8760 hours. BMP Latest Ref Rng & Units 10/31/2018 10/30/2018 10/29/2018  Glucose 70 - 99 mg/dL 115(H) 102(H) 150(H)  BUN 6 - 20 mg/dL 26(H) 25(H) 24(H)  Creatinine 0.44 - 1.00 mg/dL 1.14(H) 1.43(H) 1.39(H)  Sodium 135 - 145 mmol/L 135 137 136  Potassium 3.5 - 5.1 mmol/L 3.8 4.1 4.5  Chloride 98 - 111 mmol/L 97(L) 105 106  CO2 22 - 32 mmol/L 29 20(L) 18(L)  Calcium 8.9 - 10.3 mg/dL 8.1(L) 8.5(L) 8.8(L)   Hepatic Function Latest Ref Rng & Units 10/05/2018 08/23/2018 08/10/2018  Total Protein 6.5 - 8.1 g/dL 6.8 8.5(H) 6.0(L)  Albumin 3.5 - 5.0 g/dL 2.8(L) 3.7 2.7(L)  AST 15 - 41 U/L 34 30 24  ALT 0 - 44 U/L 38 20 22  Alk Phosphatase 38 - 126 U/L 91 89 77  Total Bilirubin 0.3 -  1.2 mg/dL 0.8 1.2 0.8  Bilirubin, Direct 0.0 - 0.3 mg/dL - - -   CBC Latest Ref Rng & Units 10/30/2018 10/29/2018 10/05/2018  WBC 4.0 - 10.5 K/uL 10.2 10.3 6.8  Hemoglobin 12.0 - 15.0 g/dL 12.1 12.8 11.1(L)  Hematocrit 36.0 - 46.0 % 36.8 41.1 35.9(L)  Platelets 150 - 400 K/uL 202 216 391   Lipid Panel     Component Value Date/Time   CHOL 121 12/21/2014 0700   TRIG 66 12/21/2014 0700   HDL 53 12/21/2014 0700   CHOLHDL 2.3 12/21/2014 0700   VLDL 13 12/21/2014 0700   LDLCALC 55 12/21/2014 0700   Cardiac Panel (last 3 results) Recent Labs    10/29/18 1607 10/30/18 0528 10/30/18 1645  TROPONINI 0.32* 0.62* 0.65*    HEMOGLOBIN A1C Lab Results  Component Value Date   HGBA1C 5.8 (H) 08/09/2018   MPG 119.76 08/09/2018   TSH Recent Labs    08/09/18 1650 10/29/18 1607  TSH 0.492 0.646   Imaging: Ct Angio Chest Pe W Or Wo Contrast  Result Date: 10/31/2018 CLINICAL DATA:  PE suspected EXAM: CT ANGIOGRAPHY CHEST WITH CONTRAST TECHNIQUE: Multidetector CT imaging of the chest was performed using the standard protocol during bolus administration of intravenous contrast. Multiplanar CT image reconstructions and MIPs were obtained to evaluate the vascular anatomy. CONTRAST:  21m OMNIPAQUE IOHEXOL 350 MG/ML SOLN COMPARISON:  Chest x-ray 10/29/2018.  Chest CT 08/10/2018 FINDINGS: Cardiovascular: Filling defects are noted within bilateral lower lobe pulmonary arteries compatible with pulmonary emboli. No evidence of right heart strain. Heart is enlarged. Aorta is normal caliber. Mediastinum/Nodes: No mediastinal, hilar, or axillary adenopathy. Lungs/Pleura: Large right pleural effusion and small left pleural effusion. Area of masslike consolidation in the right lung base with locules of gas measures 4.7 x 4.5 cm. There are scattered ground-glass airspace opacities in the peripheral inferior right upper lobe and in the right lower lobe. This could be infectious or related to pulmonary infarcts.  Peripheral ground-glass opacity also noted posteriorly in the left lower lobe which again could be infectious or related to pulmonary infarct. Upper Abdomen: Evaluation limited due to artifact related to the cardiac monitor sitting on the anterior abdomen of the patient. Musculoskeletal: Chest wall soft tissues are unremarkable. No acute bony abnormality. Review of the MIP images confirms the above findings. IMPRESSION: Small to medium-sized bilateral lower lobe pulmonary emboli. No evidence of right heart strain. Cardiomegaly. Large right pleural effusion and small left pleural effusion. Dense consolidation in the right lower lobe with masslike consolidation and locules of gas noted. This area could reflect pneumonia and/or pulmonary abscess. Scattered ground-glass opacities in the lungs could be infectious or related to pulmonary infarcts. These results will be called to the ordering clinician or representative by the Radiologist Assistant, and communication documented in the PACS or zVision Dashboard. Electronically Signed   By: Rolm Baptise M.D.   On: 10/31/2018 22:58    Cardiac Studies: EKG 10/29/2018: Sinus tachycardia 138 bpm. Left atrial enlargement. Old anteroseptal infarct. Ventricular trigeminy. Nonspecific ST-T changes.  Telemetry: 7 beat NSVT 11/01/2018, continues to remain tachycardic.  Echocardiogram 08/10/2018: Mild LV dilatation. EF 20-25%. Global diffuse hypokinesis. Restrictive physiology. Mild RV dilatation. Moderate RV systolic dysfunction.  Severe left atrial dilatation. Mild right atrial dilatation. Severe tricuspid regurgitation. Severe pulmonary hypertension RVSP 66 mmHg. Trivial pericardial effusion  Scheduled Meds: . amantadine  100 mg Oral BID  . ARIPiprazole  10 mg Oral Daily  . digoxin  0.125 mg Oral Daily  . feeding supplement (ENSURE ENLIVE)  237 mL Oral TID BM  . furosemide  40 mg Intravenous Q8H  . [START ON 11/02/2018] haloperidol decanoate  100 mg  Intramuscular Once  . isosorbide-hydrALAZINE  1 tablet Oral TID  . multivitamin with minerals  1 tablet Oral Daily  . nicotine  7 mg Transdermal Daily  . Rivaroxaban  15 mg Oral BID WC  . spironolactone  25 mg Oral Daily   Continuous Infusions: . heparin 1,200 Units/hr (11/01/18 0030)   PRN Meds:.benzonatate, Melatonin  Assessment/Plan:  1.  Acute on chronic systolic and diastolic heart failure, involving the left ventricle 2.  Acute pulmonary embolism, abnormal CTA 11/01/2018 revealing pulmonary embolism and cavitary pneumonia and large right pleural effusion 3.  Cavitating pneumonia, new findings of cavitary pneumonia compared to February CTA.  Recommendation: Patient's underlying tachycardia is related to cardiomyopathy and also acute pulmonary embolism and underlying lung pathology.  Patient allowed me to examine her but would not answer much of the questions except stating that she does not feel well.   She is not on any guideline directed medical therapy, would recommend starting carvedilol in lieu of cocaine use to sit nonselective beta blocker at 3.125 b.i.d. and see whether she would tolerate it and will be compliant.  She had an episode of NSVT today on telemetry, 7 beat run.  She is not a candidate for any aggressive approach to  etiology for cardiomyopathy in view of noncompliance.  Would not recommend stress testing or cardiac catheterization.  It is surprising that she has developed cavitating pneumonia, new finding since last CT scan in February 2020.   Adrian Prows, M.D. 11/01/2018, 8:31 AM [facility] Pager: [phone] Office: [phone] If no answer: [phone]

## 2018-11-01 NOTE — Progress Notes (Signed)
Pt is still refusing Tele monitor. MD is aware. Will continue to monitor.

## 2018-11-01 NOTE — Progress Notes (Signed)
CCMD called, patient leads off. RN in room to check on patient and patient has removed telemetry and was in the shower. Patient has been instructed not to take off heart monitor but refuses to comply. Will continue to monitor.  Ernestina [place], RN

## 2018-11-01 NOTE — Progress Notes (Signed)
FPTS Interim Progress Note  CTA shows b/l lower lobe pulmonary emboli.  Heparin gtt indicated for treatment.  Hgb stable and no active bleed, no contraindication to heparin gtt.  Explanation of PE, reason to treat, risks and benefits of heparin were discussed with the patient by myself and Dr. Mosetta Putt.  Patient able to verbalize that she has a clot in her lungs that was seen on the imaging she had of her chest.  She was able to voice that she needs a blood thinner to treat the clots.  She is also able to state that she has never been on a blood thinner before.  She voiced understanding that she has a risk of bleeding while on the blood thinner, but that it would treat her blood clots.  Patient agreed to starting heparin gtt.  Will continue to monitor patient on telemetry.  Christine Cobb, Christine Ice, DO 11/01/2018, 12:30 AM PGY-1, [facility] pager [phone]

## 2018-11-01 NOTE — Progress Notes (Signed)
Pt removed telemetry box and went into the shower without consulting the nursing staff. Pt continues to refuse to let nursing staff know when she wants to shower.

## 2018-11-01 NOTE — Progress Notes (Signed)
Patient out of the shower. Patient refused telemetry monitor. CCMD and MD notified.   Ernestina Columbia, RN

## 2018-11-01 NOTE — TOC Initial Note (Addendum)
Transition of Care [facility]) - Initial/Assessment Note    Patient Details  Name: Christine Cobb MRN: [id number] Date of Birth: 06-26-1976  [facility]) CM/SW Contact:    Candie Chroman, LCSW Phone Number: 11/01/2018, 9:39 AM  Clinical Narrative: CSW met with patient but she was too lethargic to have full conversation. She would quietly respond "uh huh" to some questions but never opened her eyes. She confirmed she is interested in restarting therapy at [facility]. CSW left a voicemail this morning to see if she would need to set up appt or come in as walk-in for initial appointment. Patient confirmed she was agreeable to [place] setting her up with a PCP but did not answer when CSW inquired about preference. She also would not answer when CSW asked about interest in substance abuse resources. CSW left resources for local shelter, emergency assistance, substance abuse treatment, food pantries and free meals. Will follow up later when patient is more awake.   3:30 pm: Talked to [facility] who stated they are not taking new patients right but patient can come in for resources regarding other services. Per CSW coworker, [facility] is currently under investigation by [employer].          4:18 pm: Per psych note, patient does not have capacity to refuse treatment. Discussed with MD. Per notes, her significant other/spouse passed away 3 months ago. Based on notes from past admissions, he has a son that is at least 20 years old and a minor child. Notes also report a daughter-in-law that lives locally. Whenever patient is alert enough, RN will talk with patient and see if she brought her cell phone here or if she can give [place] the phone numbers for her son or daughter-in-law. CSW left voicemails for emergency contacts listed.  4:33 pm: RN obtained daughter-in-law's phone number from patient. Demetrio Lapping: [phone]. Sent message to MD to notify.  Expected Discharge Plan:  Homeless Shelter Barriers to Discharge: Continued Medical Work up   Patient Goals and CMS Choice Patient states their goals for this hospitalization and ongoing recovery are:: Too lethargic to answer CMS Medicare.gov Compare Post Acute Care list provided to:: Patient Choice offered to / list presented to : NA  Expected Discharge Plan and Services Expected Discharge Plan: [place]   Discharge Planning Services: CM Consult Post Acute Care Choice: NA Living arrangements for the past 2 months: [place]                   DME Agency: NA       HH Arranged: NA [place] Agency: NA        Prior Living Arrangements/Services Living arrangements for the past 2 months: [place] with:: Self Patient language and need for interpreter reviewed:: No Do you feel safe going back to the place where you live?: Yes      Need for Family Participation in Patient Care: No (Comment) Care giver support system in place?: No (comment)   Criminal Activity/Legal Involvement Pertinent to Current Situation/Hospitalization: No - Comment as needed  Activities of Daily Living Home Assistive Devices/Equipment: None ADL Screening (condition at time of admission) Patient's cognitive ability adequate to safely complete daily activities?: Yes Is the patient deaf or have difficulty hearing?: No Does the patient have difficulty seeing, even when wearing glasses/contacts?: No Does the patient have difficulty concentrating, remembering, or making decisions?: Yes Patient able to express need for assistance with ADLs?: No Does the  patient have difficulty dressing or bathing?: Yes Independently performs ADLs?: No Communication: Independent Dressing (OT): Independent Grooming: Independent Feeding: Independent Bathing: Independent Toileting: Independent In/Out Bed: Independent Walks in Home: Independent Does the patient have difficulty walking or climbing stairs?: Yes Weakness of Legs:  Both Weakness of Arms/Hands: None  Permission Sought/Granted Permission sought to share information with : Case Manager                Emotional Assessment Appearance:: Appears stated age Attitude/Demeanor/Rapport: Lethargic Affect (typically observed): Unable to Assess Orientation: : Oriented to Self, Oriented to Place, Oriented to  Time, Oriented to Situation Alcohol / Substance Use: Illicit Drugs Psych Involvement: No (comment)(Patient hopes to restart counseling.)  Admission diagnosis:  Acute on chronic combined systolic and diastolic congestive heart failure ([place]) [I50.43] Patient Active Problem List   Diagnosis Date Noted  . CHF exacerbation ([place]) 10/29/2018  . Malnutrition of moderate degree 08/11/2018  . Hemoptysis 08/09/2018  . Respiratory failure ([place])   . Chest pain   . Community acquired pneumonia of right lower lobe of lung ([place])   . Bipolar affective disorder, manic, severe ([place]) 04/30/2017  . Severe bipolar affective disorder with psychosis ([place]) 04/29/2017  . Cocaine abuse with cocaine-induced mood disorder ([place]) 06/02/2016  . Schizoaffective disorder, bipolar type ([place])   . Bipolar disorder, current episode manic without psychotic features, severe ([place])   . Agitation   . Manic behavior ([place])   . Hyperthyroidism 09/21/2013  . Marijuana abuse 04/12/2013  . History of CHF (congestive heart failure) 02/01/2013  . Abscess of abdominal wall 10/18/2012  . HTN (hypertension) 04/08/2011  . Tachycardia 04/08/2011   PCP:  Patient, No Pcp Per Pharmacy:   [place], [place] [address] Phone: [phone] Fax: [phone]  [id number] [id number]: [phone]: [phone]    Social Determinants of Health (SDOH) Interventions    Readmission Risk  Interventions No flowsheet data found.

## 2018-11-01 NOTE — Consult Note (Addendum)
[facility] Face-to-Face Psychiatry Consult   Reason for Consult:  Capacity evaluation  Referring Physician:  Dr. Dorris Singh Patient Identification: Christine Cobb MRN:  [id number] Principal Diagnosis: Evaluation by psychiatric service required Diagnosis:  Principal Problem:   Evaluation by psychiatric service required Active Problems:   CHF exacerbation ([place])   Total Time spent with patient: 1 hour  Subjective:   Christine Cobb is a 43 y.o. female patient admitted with CHF exacerbation due to poor medication compliance.  HPI:   Per chart review, patient was admitted with CHF exacerbation due to poor medication compliance. She has been intermittently refusing treatment so psychiatry was consulted for capacity evaluation. She has been refusing to wear her telemetry monitor. She is refusing Heparin for bilateral lower lobe pulmonary emboli. She refused her morning labs. She met with the social worker today and was minimally participating in interview due to reported lethargy. UDS was positive for cocaine on admission.   Of note, patient was last seen by the psychiatry service at [facility] on 4/9. She reported presenting to the hospital for medication refills. She was released from a long term hospitalization at [facility] 2 weeks prior. She was found to have contraband on the unit including multiple lighters, 2 crack pipes and a hidden debit card in her vagina. She was provided her monthly Haldol injection and discharged with a plan to follow up with her outpatient provider.   On interview, Christine Cobb is drowsy and intermittently falls asleep. She is able to state the medical conditions that she is receiving treatment for in the hospital. She lacks insight into the seriousness of her conditions and is unable to rationally manipulate information. She reports that she intermittently refuses treatment because she just does not want to participate at a particular time such as refusing her telemetry  monitor to shower. She also reports that she does not want Heparin because the injections are painful. She is unable to state the indication for Heparin or the risks of refusing this medication. She denies SI, HI or AVH. She is able to name her medications that she is prescribed for her mental health. She denies cocaine use even after she is informed that her UDS was positive for cocaine on admission.   Past Psychiatric History: MDD, BPAD, schizoaffective disorder, anxiety and polysubstance abuse.   Risk to Self:  None. Denies SI.  Risk to Others:  None. Denies HI. Prior Inpatient Therapy:   She was last hospitalized at [facility] in April for psychosis.  Prior Outpatient Therapy:   [facility] and [facility].   Past Medical History:  Past Medical History:  Diagnosis Date  . Arm pain   . Bipolar affective disorder, currently manic, mild ([facility])   . CHF (congestive heart failure) ([place])   . Eye globe prosthesis   . HTN (hypertension)   . Hyperthyroidism   . Sinus tachycardia     Past Surgical History:  Procedure Laterality Date  . DILATION AND CURETTAGE OF UTERUS    . EYE SURGERY     Family History:  Family History  Problem Relation Age of Onset  . Hypertension Other   . Emphysema Other   . Asthma Son   . Diabetes Maternal Uncle   . Diabetes Paternal Grandmother    Family Psychiatric  History: Denies  Social History:  Social History   Substance and Sexual Activity  Alcohol Use Yes   Comment: Last drink: 1/2 beer PTA     Social History  Substance and Sexual Activity  Drug Use Yes  . Types: Marijuana   Comment: Pt sts "anything and everything"    Social History   Socioeconomic History  . Marital status: Married    Spouse name: Not on file  . Number of children: Not on file  . Years of education: Not on file  . Highest education level: Not on file  Occupational History  . Not on file  Social Needs  . Financial resource strain: Not on file  . Food  insecurity:    Worry: Not on file    Inability: Not on file  . Transportation needs:    Medical: Not on file    Non-medical: Not on file  Tobacco Use  . Smoking status: Current Every Day Smoker    Types: Cigarettes    Last attempt to quit: 08/21/2013    Years since quitting: 5.2  . Smokeless tobacco: Never Used  Substance and Sexual Activity  . Alcohol use: Yes    Comment: Last drink: 1/2 beer PTA  . Drug use: Yes    Types: Marijuana    Comment: Pt sts "anything and everything"  . Sexual activity: Yes    Birth control/protection: Injection    Comment: crack cocaine  Lifestyle  . Physical activity:    Days per week: Not on file    Minutes per session: Not on file  . Stress: Not on file  Relationships  . Social connections:    Talks on phone: Not on file    Gets together: Not on file    Attends religious service: Not on file    Active member of club or organization: Not on file    Attends meetings of clubs or organizations: Not on file    Relationship status: Not on file  Other Topics Concern  . Not on file  Social History Narrative  . Not on file   Additional Social History: She is homeless.     Allergies:   Allergies  Allergen Reactions  . Amoxil [Amoxicillin] Anaphylaxis    Did it involve swelling of the face/tongue/throat, SOB, or low BP? yes Did it involve sudden or severe rash/hives, skin peeling, or any reaction on the inside of your mouth or nose? yes Did you need to seek medical attention at a hospital or doctor's office? unknown When did it last happen?unknoen If all above answers are "NO", may proceed with cephalosporin use.   Marland Kitchen Hydroxyzine     Other reaction(s): Bleeding  . Ketorolac Tromethamine Hives and Swelling  . Prenatal [B-Plex Plus] Nausea Only  . Tegretol [Carbamazepine] Other (See Comments)    Swelling, skin peeling, skin discoloration  . B-Plex Plus Nausea Only    Prenatal   . Tegretol [Carbamazepine] Swelling and Other (See  Comments)    Skin peeling Skin discoloring     Labs:  Results for orders placed or performed during the hospital encounter of 10/29/18 (from the past 48 hour(s))  Troponin I - Once     Status: Abnormal   Collection Time: 10/30/18  4:45 PM  Result Value Ref Range   Troponin I 0.65 (HH) <0.03 ng/mL    Comment: CRITICAL VALUE NOTED.  VALUE IS CONSISTENT WITH PREVIOUSLY REPORTED AND CALLED VALUE. Performed at [facility], [place] [address] [place], [place] [address]   Pregnancy, urine     Status: None   Collection Time: 10/31/18 11:25 AM  Result Value Ref Range   Preg Test, Ur NEGATIVE NEGATIVE  Comment: Performed at [facility], [place] [address] [place], [place] [address]  Basic metabolic panel     Status: Abnormal   Collection Time: 10/31/18  2:36 PM  Result Value Ref Range   Sodium 135 135 - 145 mmol/L   Potassium 3.8 3.5 - 5.1 mmol/L   Chloride 97 (L) 98 - 111 mmol/L   CO2 29 22 - 32 mmol/L   Glucose, Bld 115 (H) 70 - 99 mg/dL   BUN 26 (H) 6 - 20 mg/dL   Creatinine, Ser 1.14 (H) 0.44 - 1.00 mg/dL   Calcium 8.1 (L) 8.9 - 10.3 mg/dL   GFR calc non Af Amer >60 >60 mL/min   GFR calc Af Amer >60 >60 mL/min   Anion gap 9 5 - 15    Comment: Performed at [facility] [address], [place], [place] [address]    Current Facility-Administered Medications  Medication Dose Route Frequency Provider Last Rate Last Dose  . amantadine (SYMMETREL) capsule 100 mg  100 mg Oral BID Mullis, Kiersten P, DO   100 mg at 11/01/18 1050  . ARIPiprazole (ABILIFY) tablet 10 mg  10 mg Oral Daily Mullis, Kiersten P, DO   10 mg at 10/31/18 1955  . benzonatate (TESSALON) capsule 200 mg  200 mg Oral TID PRN Meccariello, Bernita Raisin, DO   200 mg at 10/31/18 2309  . digoxin (LANOXIN) tablet 0.125 mg  0.125 mg Oral Daily Patwardhan, Manish J, MD   0.125 mg at 11/01/18 1049  . feeding supplement (ENSURE ENLIVE) (ENSURE ENLIVE) liquid 237 mL  237 mL Oral TID BM Mullis, Kiersten P, DO   237 mL at  10/31/18 1955  . furosemide (LASIX) injection 40 mg  40 mg Intravenous Q8H Winfrey, Amanda C, MD   40 mg at 11/01/18 1049  . [START ON 11/02/2018] haloperidol decanoate (HALDOL DECANOATE) 100 MG/ML injection 100 mg  100 mg Intramuscular Once Mullis, Kiersten P, DO      . isosorbide-hydrALAZINE (BIDIL) 20-37.5 MG per tablet 1 tablet  1 tablet Oral TID Patwardhan, Reynold Bowen, MD   1 tablet at 11/01/18 1050  . Melatonin TABS 3 mg  3 mg Oral QHS PRN Meccariello, Bernita Raisin, DO   3 mg at 10/31/18 2329  . multivitamin with minerals tablet 1 tablet  1 tablet Oral Daily Mullis, Kiersten P, DO   1 tablet at 11/01/18 1049  . nicotine (NICODERM CQ - dosed in mg/24 hr) patch 7 mg  7 mg Transdermal Daily Winfrey, Alcario Drought, MD   7 mg at 11/01/18 1055  . Rivaroxaban (XARELTO) tablet 15 mg  15 mg Oral BID WC Enid Derry, [place], DO      . spironolactone (ALDACTONE) tablet 25 mg  25 mg Oral Daily Patwardhan, Reynold Bowen, MD        Musculoskeletal: Strength & Muscle Tone: within normal limits Gait & Station: UTA since patient is sitting in a chair.  Patient leans: N/A  Psychiatric Specialty Exam: Physical Exam  Nursing note and vitals reviewed. Constitutional: She is oriented to person, place, and time. She appears well-developed and well-nourished.  HENT:  Head: Normocephalic and atraumatic.  Neck: Normal range of motion.  Respiratory: Effort normal.  Musculoskeletal: Normal range of motion.  Neurological: She is alert and oriented to person, place, and time.  Psychiatric: She has a normal mood and affect. Her speech is normal and behavior is normal. Judgment and thought content normal. Cognition and memory are normal.    Review  of Systems  Respiratory: Positive for cough.   Psychiatric/Behavioral: Positive for substance abuse. Negative for hallucinations and suicidal ideas. The patient does not have insomnia.   All other systems reviewed and are negative.   Blood pressure 110/76, pulse (!) 140, temperature 99.9  F (37.7 C), temperature source Tympanic, resp. rate 18, height 5' 4" (1.626 m), weight 62.4 kg, last menstrual period 09/13/2018, SpO2 99 %.Body mass index is 23.6 kg/m.  General Appearance: Fairly Groomed, middle aged, African American female, wearing a hospital gown with a wig who is sitting in a chair. NAD.   Eye Contact:  Poor since eyes are closed.   Speech:  Clear and Coherent and Slow  Volume:  Decreased  Mood:  Euthymic  Affect:  Constricted  Thought Process:  Linear and Descriptions of Associations: Intact  Orientation:  Full (Time, Place, and Person)  Thought Content:  Logical  Suicidal Thoughts:  No  Homicidal Thoughts:  No  Memory:  Immediate;   Fair Recent;   Fair Remote;   Fair  Judgement:  Poor  Insight:  Fair  Psychomotor Activity:  Normal  Concentration:  Concentration: Poor and Attention Span: Poor  Recall:  [employer] of Knowledge:  Fair  Language:  Fair  Akathisia:  No  Handed:  Right  AIMS (if indicated):   N/A  Assets:  Resilience  ADL's:  Intact  Cognition:  WNL  Sleep:   Okay   Assessment:  Christine Cobb is a 43 y.o. female who was admitted with CHF exacerbation due to poor medication compliance. She does not demonstrate capacity to refuse current treatment. She does not appear to understand the seriousness of her condition and is unable to rationally manipulate information. She does not know the indication for Heparin and is unaware of the risks of refusing treatment. She will need an authorized representative to help her make these medical decisions.   Treatment Plan Summary: -Patient does not demonstrate capacity to refuse current treatment.  -Continue Abilify 10 mg daily for mood stabilization. -Continue Amantadine 100 mg BID for EPS prophylaxis.  -Continue monthly Haldol decanoate 100 mg injection (next due on 5/7).  -Continue Melatonin 3 mg qhs PRN for insomnia.  -EKG reviewed and QTc 477 on 5/5. Please closely monitor when starting or  increasing QTc prolonging agents.  -Psychiatry will sign off on patient at this time. Please consult psychiatry again as needed.   Disposition: Patient does not meet criteria for psychiatric inpatient admission.  Faythe Dingwall, DO 11/01/2018 3:10 PM

## 2018-11-01 NOTE — Progress Notes (Signed)
Discussed CTA chest with radiology regarding possible findings of abscess vs pneumonia on recent read.  Based on patient's vitals and remaining afebrile, this is likely infarct related given new PE. Hampton's hump was noted on CXR which is c/w PE.  CTA chest of patient had effusions present during CTA chest in February, which is likely related to patient's fluid overloaded from CHF.  Will continue to diurese patient and treat for acute PE.  Will continue to monitor for any signs of developing pneumonia.   [place] Ameia Morency, DO PGY-2, [facility]

## 2018-11-01 NOTE — Progress Notes (Signed)
Family Medicine Teaching Service Daily Progress Note Intern Pager: [phone]  Patient name: Christine Cobb Medical record number: [id number] Date of birth: March 01, 1976 Age: 43 y.o. Gender: female  Primary Care Provider: Patient, No Pcp Per  Consultants: Cardiology Code Status: Full  Pt Overview and Major Events to Date:  5/3 Admitted to FPTS, CXR R>L pulmonary effusion, RUL opacity  Assessment and Plan: Christine Cobb is a 43 y.o. female presenting with worsening peripheral edema and dyspnea on exertion. PMH is significant for current every day smoker with history of COPD, HFrEF (EF 20-25%), HTN, Hyperthyroidism, bipolar mood disorder, schizoaffective disorder, and polysubstance abuse.   Small-medium sized bilateral LL PE: Acute. Noted on CTA. Hemodynamically stable, however remains tachycardic in the 130s.  Started on heparin drip overnight.  Etiology unclear, no known history of hypercoagulable state.  Will transition to p.o. Xarelto today, preferred a once daily dosing to be more convenient for patient outpatient as she is struggled with compliance in the past. - Start Xarelto 15 mg daily - Continue age-appropriate cancer screenings  RLL consolidation on CT concerning for PNA/pulmonary abscess: Subacute. Dense consolidation in RLL with masslike consolidation locules of gas noted on CT scan.  Incidental finding while obtained CTA to rule out PE.  Patient does not appear infectious, she has been afebrile, without productive cough and no leukocytosis.   -Discuss with radiology and continue to monitor   Acute on chronic HFrEF with EF 20-25%: Improving.  Slight improvement of LE edema, still endorses SOB at rest.  Satting well on RA.  Wt 62.4 kg, down 4 Kg, however weight has been inconsistent.  -4.4L since admit.  Associated large right pleural effusion and small left effusion on CT.  Avoiding ACE/AEB due to renal function, which is fortunately improving. - Cardiology on board,  appreciate recommendations as below  - Continue IV Lasix 40 mg 3 times daily  -BiDil 20-30 7.5 3 times daily  - Digoxin 0.125 mg to assist HR, will not DC on this  - Start spironolactone 25 mg, refused earlier today, will retry to discuss  -Avoid beta-blockers and CCB's during acute phase  -Cardiology considering invasive coronary evaluation to rule out ischemic  cardiomyopathy, however she has refused in the past - Strict I and O's -Daily weights - CM, vitals per routine  Bipolar disorder  schizoaffective  MDD  anxiety: Chronic. Appears to be a major barrier to her care, challenging to fully assess her capacity as she often stops responding if she does not wish to continue the conversation. Was not taking any of her previous medications on admission, restarted on Haldol, Abilify, and amantadine per psychiatry.  Will additionally have psychiatry formally evaluate today to assist in maximizing psychiatric medical management and additional assessment of capacity.  She appears to have some insight into her chronic conditions, however does not seem to reliably express consequences to not continuing therapies. - Consult psychiatry, appreciate recommendations - IV Haldol monthly, due for dose tomorrow 5/7 - Aripiprazole 10 mg daily -Amantadine 100 mg twice daily  Homelessness  No PCP:  Social work on board to help assist in finding appropriate shelter at discharge and additional resources for substance abuse, food pantries, and free meals. - Social work on Theatre manager, greatly appreciate help - Case management consulted, appreciate recommendations  Acute on CKD: Improving. Cr 1.14 on 5/5, from 1.43.  Refused BMP this morning, will reattempt.  Likely 2/2 cardiorenal syndrome. - Monitor BMP -Avoid nephrotoxic medications as possible  HTN: Chronic, stable. Normotensive,  SBP 120s.  Did not take anything at home. - Monitor BP  COPD: Chronic, stable. No wheezing appreciated, on RA.  Takes  albuterol as needed.  Home meds: Albuterol PRN.  No wheezing appreciated on exam. - Albuterol as needed - likely sent home with Spiriva at discharge  Polysubstance Abuse: Chronic. Current daily smoker, history of cocaine and MJ use.  UDS positive for cocaine on admit. - Nicotine patch 7 mg - Avoid beta-blockers - Continue to encourage cessation, patient has voiced the want to discontinue  FEN/GI: heart healthy diet PPx: IV heparin, transition to full dose Xarelto  Disposition: Continued diuresis and medical management, disposition help with social work  Subjective:  Started on a heparin drip overnight due to bilateral PEs found on CTA.  This morning she says she continues to feel short of breath at all times.  Denies any associated chest pains.  Frustrated about her lower extremity edema, would like that to go away.  Says she will be interested in starting spironolactone if is going to help with her feet edema.  Objective: Temp:  [98.6 F (37 C)-99.9 F (37.7 C)] 99.5 F (37.5 C) (05/06 0440) Pulse Rate:  [138-190] 145 (05/06 0440) Resp:  [20-24] 20 (05/06 0440) BP: (114-129)/(60-97) 121/93 (05/06 0440) SpO2:  [96 %-99 %] 98 % (05/06 0440) Weight:  [62.4 kg] 62.4 kg (05/06 0444) Physical Exam: General: Calm middle-aged female in no acute distress, laying on her right side comfortably HEENT: NCAT, MMM, oropharynx nonerythematous  Cardiac: Tachycardic, regular rhythm without murmurs, gallops, or rubs noted Lungs: Diminished breath sounds on the right while laying lateral decubitus, appeared clear on the left without any appreciable crackles.  Does not appear an increased work of breathing. Abdomen: soft, non-tender, non-distended, normoactive BS Msk: Moves all extremities spontaneously  Ext: Warm, dry, 2+ distal pulses, 2+ pitting edema to knee on right leg, 1+ pitting edema to knee on the left. Psych: Alert and oriented, calm and answers questions appropriately, however shies away  from too much conversation.  Laboratory: Recent Labs  Lab 10/29/18 1235 10/30/18 0528  WBC 10.3 10.2  HGB 12.8 12.1  HCT 41.1 36.8  PLT 216 202   Recent Labs  Lab 10/29/18 1235 10/30/18 0528 10/31/18 1436  NA 136 137 135  K 4.5 4.1 3.8  CL 106 105 97*  CO2 18* 20* 29  BUN 24* 25* 26*  CREATININE 1.39* 1.43* 1.14*  CALCIUM 8.8* 8.5* 8.1*  GLUCOSE 150* 102* 115*    Imaging/Diagnostic Tests: Dg Chest 2 View  Result Date: 10/29/2018 CLINICAL DATA:  Shortness of breath for 4 days EXAM: CHEST - 2 VIEW COMPARISON:  10/05/2018 FINDINGS: Chronic cardiomegaly. Small pleural effusions with lower lobe opacity greater on the right. A similar appearance was seen previously. There is a right upper lobe opacity outlining the minor fissure, convincing even when accounting for an overlapping EKG lead. No pneumothorax. IMPRESSION: 1. Right upper lobe opacity that is focal and suspicious for pneumonia. 2. Cardiomegaly with right more than left pleural effusion and lower lobe atelectasis/opacification. Electronically Signed   By: Marnee Spring M.D.   On: 10/29/2018 13:52   Ct Angio Chest Pe W Or Wo Contrast  Result Date: 10/31/2018 CLINICAL DATA:  PE suspected EXAM: CT ANGIOGRAPHY CHEST WITH CONTRAST TECHNIQUE: Multidetector CT imaging of the chest was performed using the standard protocol during bolus administration of intravenous contrast. Multiplanar CT image reconstructions and MIPs were obtained to evaluate the vascular anatomy. CONTRAST:  77mL OMNIPAQUE IOHEXOL 350  MG/ML SOLN COMPARISON:  Chest x-ray 10/29/2018.  Chest CT 08/10/2018 FINDINGS: Cardiovascular: Filling defects are noted within bilateral lower lobe pulmonary arteries compatible with pulmonary emboli. No evidence of right heart strain. Heart is enlarged. Aorta is normal caliber. Mediastinum/Nodes: No mediastinal, hilar, or axillary adenopathy. Lungs/Pleura: Large right pleural effusion and small left pleural effusion. Area of masslike  consolidation in the right lung base with locules of gas measures 4.7 x 4.5 cm. There are scattered ground-glass airspace opacities in the peripheral inferior right upper lobe and in the right lower lobe. This could be infectious or related to pulmonary infarcts. Peripheral ground-glass opacity also noted posteriorly in the left lower lobe which again could be infectious or related to pulmonary infarct. Upper Abdomen: Evaluation limited due to artifact related to the cardiac monitor sitting on the anterior abdomen of the patient. Musculoskeletal: Chest wall soft tissues are unremarkable. No acute bony abnormality. Review of the MIP images confirms the above findings. IMPRESSION: Small to medium-sized bilateral lower lobe pulmonary emboli. No evidence of right heart strain. Cardiomegaly. Large right pleural effusion and small left pleural effusion. Dense consolidation in the right lower lobe with masslike consolidation and locules of gas noted. This area could reflect pneumonia and/or pulmonary abscess. Scattered ground-glass opacities in the lungs could be infectious or related to pulmonary infarcts. These results will be called to the ordering clinician or representative by the Radiologist Assistant, and communication documented in the PACS or zVision Dashboard. Electronically Signed   By: Charlett NoseKevin  Dover M.D.   On: 10/31/2018 22:58    Allayne StackBeard, Samantha N, DO 11/01/2018, 7:12 AM PGY-1, [facility] FPTS Intern pager: [phone], text pages welcome

## 2018-11-01 NOTE — Progress Notes (Addendum)
Patient refused xerelto and bidil at this time. States, "Im sleeping"  1824: Patient took Xerelto at this time

## 2018-11-01 NOTE — Progress Notes (Signed)
Pt refusing morning labs. Provider paged. Will continue to monitor.

## 2018-11-02 ENCOUNTER — Inpatient Hospital Stay: Payer: Self-pay

## 2018-11-02 ENCOUNTER — Inpatient Hospital Stay (HOSPITAL_COMMUNITY): Payer: Medicare HMO

## 2018-11-02 DIAGNOSIS — F1910 Other psychoactive substance abuse, uncomplicated: Secondary | ICD-10-CM

## 2018-11-02 DIAGNOSIS — I2699 Other pulmonary embolism without acute cor pulmonale: Secondary | ICD-10-CM

## 2018-11-02 LAB — CBC
HCT: 34.9 % — ABNORMAL LOW (ref 36.0–46.0)
Hemoglobin: 11.4 g/dL — ABNORMAL LOW (ref 12.0–15.0)
MCH: 24.8 pg — ABNORMAL LOW (ref 26.0–34.0)
MCHC: 32.7 g/dL (ref 30.0–36.0)
MCV: 75.9 fL — ABNORMAL LOW (ref 80.0–100.0)
Platelets: 226 10*3/uL (ref 150–400)
RBC: 4.6 MIL/uL (ref 3.87–5.11)
RDW: 16.9 % — ABNORMAL HIGH (ref 11.5–15.5)
WBC: 15.8 10*3/uL — ABNORMAL HIGH (ref 4.0–10.5)
nRBC: 0 % (ref 0.0–0.2)

## 2018-11-02 LAB — CBC WITH DIFFERENTIAL/PLATELET
Abs Immature Granulocytes: 0.11 10*3/uL — ABNORMAL HIGH (ref 0.00–0.07)
Basophils Absolute: 0 10*3/uL (ref 0.0–0.1)
Basophils Relative: 0 %
Eosinophils Absolute: 0 10*3/uL (ref 0.0–0.5)
Eosinophils Relative: 0 %
HCT: 34.4 % — ABNORMAL LOW (ref 36.0–46.0)
Hemoglobin: 11.3 g/dL — ABNORMAL LOW (ref 12.0–15.0)
Immature Granulocytes: 1 %
Lymphocytes Relative: 9 %
Lymphs Abs: 1.3 10*3/uL (ref 0.7–4.0)
MCH: 25.2 pg — ABNORMAL LOW (ref 26.0–34.0)
MCHC: 32.8 g/dL (ref 30.0–36.0)
MCV: 76.6 fL — ABNORMAL LOW (ref 80.0–100.0)
Monocytes Absolute: 1.7 10*3/uL — ABNORMAL HIGH (ref 0.1–1.0)
Monocytes Relative: 12 %
Neutro Abs: 11.1 10*3/uL — ABNORMAL HIGH (ref 1.7–7.7)
Neutrophils Relative %: 78 %
Platelets: 240 10*3/uL (ref 150–400)
RBC: 4.49 MIL/uL (ref 3.87–5.11)
RDW: 17.2 % — ABNORMAL HIGH (ref 11.5–15.5)
WBC: 14.4 10*3/uL — ABNORMAL HIGH (ref 4.0–10.5)
nRBC: 0 % (ref 0.0–0.2)

## 2018-11-02 LAB — BASIC METABOLIC PANEL
Anion gap: 12 (ref 5–15)
BUN: 20 mg/dL (ref 6–20)
CO2: 30 mmol/L (ref 22–32)
Calcium: 8 mg/dL — ABNORMAL LOW (ref 8.9–10.3)
Chloride: 88 mmol/L — ABNORMAL LOW (ref 98–111)
Creatinine, Ser: 1.06 mg/dL — ABNORMAL HIGH (ref 0.44–1.00)
GFR calc Af Amer: >60 mL/min (ref >60)
GFR calc non Af Amer: >60 mL/min (ref >60)
Glucose, Bld: 123 mg/dL — ABNORMAL HIGH (ref 70–99)
Potassium: 4 mmol/L (ref 3.5–5.1)
Sodium: 130 mmol/L — ABNORMAL LOW (ref 135–145)

## 2018-11-02 LAB — LACTIC ACID, PLASMA
Lactic Acid, Venous: 2.1 mmol/L (ref 0.5–1.9)
Lactic Acid, Venous: 1.6 mmol/L (ref 0.5–1.9)

## 2018-11-02 LAB — ECHOCARDIOGRAM LIMITED
Height: 64 in
Weight: 2081.6 oz

## 2018-11-02 MED ORDER — ACETAMINOPHEN 325 MG PO TABS
650.0000 mg | ORAL_TABLET | Freq: Four times a day (QID) | ORAL | Status: DC | PRN
  Administered 2018-11-08 – 2018-11-15 (×23): 650 mg via ORAL
  Filled 2018-11-02 (×23): qty 2

## 2018-11-02 MED ORDER — VANCOMYCIN HCL IN DEXTROSE 1-5 GM/200ML-% IV SOLN
1000.0000 mg | INTRAVENOUS | Status: DC
  Administered 2018-11-03 – 2018-11-06 (×4): 1000 mg via INTRAVENOUS
  Filled 2018-11-02 (×5): qty 200

## 2018-11-02 MED ORDER — FUROSEMIDE 10 MG/ML IJ SOLN: 40.0000 mg | Freq: Two times a day (BID) | INTRAMUSCULAR | Status: DC

## 2018-11-02 MED ORDER — MILRINONE LACTATE IN DEXTROSE 20-5 MG/100ML-% IV SOLN
0.2500 ug/kg/min | INTRAVENOUS | Status: AC
  Administered 2018-11-02 – 2018-11-03 (×2): 0.25 ug/kg/min via INTRAVENOUS
  Filled 2018-11-02 (×2): qty 100

## 2018-11-02 MED ORDER — VANCOMYCIN HCL 10 G IV SOLR
1250.0000 mg | Freq: Once | INTRAVENOUS | Status: AC
  Administered 2018-11-02: 18:00:00 1250 mg via INTRAVENOUS
  Filled 2018-11-02: qty 1250

## 2018-11-02 NOTE — Progress Notes (Signed)
Attempted echo at 10:30am.  Patient did not want to do echo at the moment and asked for it to be attempted at a later time.

## 2018-11-02 NOTE — Progress Notes (Signed)
Pt refused labs. MD paged. Will continue to monitor.

## 2018-11-02 NOTE — Progress Notes (Signed)
Pt complaining of chest and back pain. MD was notified and stated that they are aware and that this is not new for the pt. Will continue to momitor.

## 2018-11-02 NOTE — Progress Notes (Addendum)
Patient has progressed from impending shock to circulatory shock condition, predominantly cardiogenic in nature.  Lactic acid elevated at 2.1.  Blood pressure 90/60 mmHg. LVEF 10-15%.  She is not a candidate for any advanced heart failure therapy given her substance abuse and poor social support.  As result, mechanical support as a bridge will not be an option for her.    I discussed with advanced heart failure cardiologist Dr. Shirlee Latch.  Recommend PICC line placement by radiology during the hospital stay.  This will need to be removed before hospital discharge.  Recommend starting IV milrinone.  Hold all other oral heart failure therapy at this time.  Once blood pressure improved, could reintroduce BiDil or ACEi/ARB/ARNI/spironolactone, depending on her renal function.  I have discussed the recommendations with the patient, who for the first time, was able to have a conversation with me.  I still do not think she has insight in her condition, but I explained the prognosis to her in no uncertain terms.  If patient is not able to come off milrinone, she does not have any other options for heart failure therapy and will need to consider palliative care discussion.  Continue work-up for possible sepsis and treatment as necessary.  I discussed the recommendations with primary team.  Appreciate input from Dr. Shirlee Latch.Marland Kitchen  CRITICAL CARE Performed by: Elder Negus   Total critical care time: 30 minutes  Critical care time was exclusive of separately billable procedures and treating other patients.  Critical care was necessary to treat or prevent imminent or life-threatening deterioration.  Critical care was time spent personally by me on the following activities: development of treatment plan with patient and/or surrogate as well as nursing, discussions with consultants, evaluation of patient's response to treatment, examination of patient, obtaining history from patient or surrogate, ordering and  performing treatments and interventions, ordering and review of laboratory studies, ordering and review of radiographic studies, pulse oximetry and re-evaluation of patient's condition.

## 2018-11-02 NOTE — Progress Notes (Addendum)
Family Medicine Teaching Service Daily Progress Note Intern Pager: [phone]  Patient name: Christine Cobb Medical record number: [id number] Date of birth: 18-Nov-1975 Age: 43 y.o. Gender: female  Primary Care Provider: Patient, No Pcp Per  Consultants: Cardiology Code Status: Full  Pt Overview and Major Events to Date:  5/3 Admitted to FPTS, CXR R>L pulmonary effusion, RUL opacity  Assessment and Plan: Christine Cobb is a 43 y.o. female presenting with worsening peripheral edema and dyspnea on exertion. PMH is significant for current every day smoker with history of COPD, HFrEF (EF 20-25%), HTN, Hyperthyroidism, bipolar mood disorder, schizoaffective disorder, and polysubstance abuse.   Small-medium sized bilateral LL PE: Acute. Noted on CTA.  Hemodynamically stable.  Started on Xarelto yesterday.  Long-term anticoagulation will be challenging given her additional comorbidities and mental state.  Cardiology astutely recommended echocardiogram with concern for septic emboli given her previous IV drug use history, reassured that she is appeared noninfectious. - Continue Xarelto 15 mg daily (day 2)  -Follow-up on echo - Continue age-appropriate cancer screenings  Acute on chronic HFrEF with EF 20-25%: Improving.  Continued improvement of LE edema and air exchange, still endorsing SOB at rest.  On RA.  Down 3 kg from yesterday.  I and O not recorded for yesterday, down 4.4L since admit.  Avoiding ACE/AEB due to renal function, which is fortunately improving. - Cardiology on board, appreciate recommendations as below - Continue IV Lasix 40 mg, decrease to twice daily -BiDil 20-30 7.5 3 times daily - Digoxin 0.125 mg to assist HR, will not DC on this - Continue spironolactone 25 mg -Started Coreg 3.125 twice daily 5/7 - Strict I and O's -Daily weights - CM, vitals per routine  Bipolar disorder  schizoaffective  MDD  anxiety: Chronic. Evaluated by psychiatry yesterday, not  felt to have capacity.  Will need a medical representative.  Her mental state is a large barrier to her medical care.  Restarted on Haldol, Abilify, and amantadine per psychiatry on 5/5.  - Appreciate psychiatry recommendations - IV Haldol monthly, due for dose today 5/7 - Aripiprazole 10 mg daily -Amantadine 100 mg twice daily - Care for below  Lack of medical capacity:  Deemed to not have capacity for medical decision-making. - Social work on board, appreciate additional recommendations for representative - Will touch base with family members available, have contact information for reported daughter-in-law will additionally try to get a hold of her son  Homelessness  No PCP:  Social work on board to help assist in finding appropriate shelter at discharge and additional resources for substance abuse, food pantries, and free meals. - Social work on Theatre manager, greatly appreciate help - Case management consulted, appreciate recommendations  RLL consolidation on CT concerning for PNA/pulmonary abscess: Subacute. Spoke with radiologist over the phone yesterday, 5/6, and discussed image findings, he felt these abnormalities were most likely related to her PE rather than pneumonia.  We will continue to monitor for infectious signs/symptoms.  Acute on CKD: Improving. Cr 1.16 on 5/6, from 1.14.  Refused a.m. labs.  Likely secondary to cardiorenal syndrome.  - Monitor BMP (will switch to daily night time labs as patient appears to prefer this w/o refusal)  -Avoid nephrotoxic medications as possible  HTN: Chronic, stable. Normotensive, SBP 100-120s.  Did not take anything at home. - Monitor BP - Heart failure medications as above  COPD: Chronic, stable. No wheezing appreciated, on RA.  Takes albuterol as needed.  - Albuterol as needed - likely sent home  with Spiriva at discharge  Polysubstance Abuse: Chronic. Current daily smoker, history of cocaine and MJ use.  UDS positive for cocaine on  admit. - Nicotine patch 7 mg - Continue to encourage cessation, patient has voiced the want to discontinue  FEN/GI: heart healthy diet PPx: Full dose Xarelto  Disposition: Continued diuresis and medical management, disposition help with social work  Subjective:  Doing okay this morning, concerned with continuing shortness of breath and chest pain that she mainly feels in her back. Says she feels like she is getting a little bit better.  Notes the edema in her feet has gotten better since yesterday.  Objective: Temp:  [99.9 F (37.7 C)-100 F (37.8 C)] 99.9 F (37.7 C) (05/07 0023) Pulse Rate:  [139-165] 144 (05/07 0023) Resp:  [18-20] 20 (05/07 0023) BP: (109-126)/(76-93) 109/84 (05/07 0023) SpO2:  [93 %-99 %] 93 % (05/07 0023) Weight:  [59 kg] 59 kg (05/07 0023) Physical Exam: General: Calm, middle-aged female in no acute distress, laying on her left side resting HEENT: NCAT, MMM Cardiac: Tachycardic, regular rhythm without murmurs, gallops or rubs noted  Lungs: Relatively clear throughout with improved air movement from yesterday, slight bibasilar crackles.  Breathing comfortably on RA.  Abdomen: soft, non-tender, non-distended, normoactive BS Msk: Moves all extremities spontaneously  Ext: Warm, dry, 2+ distal pulses, 1+ pitting edema to the bilateral lower extremity to knee level. Psych: Calm and answers questions briefly   Laboratory: Recent Labs  Lab 10/29/18 1235 10/30/18 0528  WBC 10.3 10.2  HGB 12.8 12.1  HCT 41.1 36.8  PLT 216 202   Recent Labs  Lab 10/30/18 0528 10/31/18 1436 11/01/18 1618  NA 137 135 132*  K 4.1 3.8 3.9  CL 105 97* 91*  CO2 20* 29 26  BUN 25* 26* 17  CREATININE 1.43* 1.14* 1.16*  CALCIUM 8.5* 8.1* 8.1*  GLUCOSE 102* 115* 138*    Imaging/Diagnostic Tests: Dg Chest 2 View  Result Date: 10/29/2018 CLINICAL DATA:  Shortness of breath for 4 days EXAM: CHEST - 2 VIEW COMPARISON:  10/05/2018 FINDINGS: Chronic cardiomegaly. Small pleural  effusions with lower lobe opacity greater on the right. A similar appearance was seen previously. There is a right upper lobe opacity outlining the minor fissure, convincing even when accounting for an overlapping EKG lead. No pneumothorax. IMPRESSION: 1. Right upper lobe opacity that is focal and suspicious for pneumonia. 2. Cardiomegaly with right more than left pleural effusion and lower lobe atelectasis/opacification. Electronically Signed   By: Marnee SpringJonathon  Watts M.D.   On: 10/29/2018 13:52   Ct Angio Chest Pe W Or Wo Contrast  Result Date: 10/31/2018 CLINICAL DATA:  PE suspected EXAM: CT ANGIOGRAPHY CHEST WITH CONTRAST TECHNIQUE: Multidetector CT imaging of the chest was performed using the standard protocol during bolus administration of intravenous contrast. Multiplanar CT image reconstructions and MIPs were obtained to evaluate the vascular anatomy. CONTRAST:  80mL OMNIPAQUE IOHEXOL 350 MG/ML SOLN COMPARISON:  Chest x-ray 10/29/2018.  Chest CT 08/10/2018 FINDINGS: Cardiovascular: Filling defects are noted within bilateral lower lobe pulmonary arteries compatible with pulmonary emboli. No evidence of right heart strain. Heart is enlarged. Aorta is normal caliber. Mediastinum/Nodes: No mediastinal, hilar, or axillary adenopathy. Lungs/Pleura: Large right pleural effusion and small left pleural effusion. Area of masslike consolidation in the right lung base with locules of gas measures 4.7 x 4.5 cm. There are scattered ground-glass airspace opacities in the peripheral inferior right upper lobe and in the right lower lobe. This could be infectious or related  to pulmonary infarcts. Peripheral ground-glass opacity also noted posteriorly in the left lower lobe which again could be infectious or related to pulmonary infarct. Upper Abdomen: Evaluation limited due to artifact related to the cardiac monitor sitting on the anterior abdomen of the patient. Musculoskeletal: Chest wall soft tissues are unremarkable. No  acute bony abnormality. Review of the MIP images confirms the above findings. IMPRESSION: Small to medium-sized bilateral lower lobe pulmonary emboli. No evidence of right heart strain. Cardiomegaly. Large right pleural effusion and small left pleural effusion. Dense consolidation in the right lower lobe with masslike consolidation and locules of gas noted. This area could reflect pneumonia and/or pulmonary abscess. Scattered ground-glass opacities in the lungs could be infectious or related to pulmonary infarcts. These results will be called to the ordering clinician or representative by the Radiologist Assistant, and communication documented in the PACS or zVision Dashboard. Electronically Signed   By: Charlett Nose M.D.   On: 10/31/2018 22:58    Allayne Stack, DO 11/02/2018, 6:58 AM PGY-1, [facility] FPTS Intern pager: [phone], text pages welcome

## 2018-11-02 NOTE — Progress Notes (Addendum)
Subjective:  Continues to have shortness of breath and chest pain Tmax 100 F. WBC cout 15k  Objective:  Vital Signs in the last 24 hours: Temp:  [99.9 F (37.7 C)-100 F (37.8 C)] 99.9 F (37.7 C) (05/07 0023) Pulse Rate:  [139-165] 144 (05/07 0023) Resp:  [18-20] 20 (05/07 0023) BP: (109-126)/(76-93) 109/84 (05/07 0023) SpO2:  [93 %-99 %] 93 % (05/07 0023) Weight:  [59 kg] 59 kg (05/07 0023)  Intake/Output from previous day: 05/06 0701 - 05/07 0700 In: 1354 [P.O.:1354] Out: 1302 [Urine:1300; Stool:2]  Physical Exam  Constitutional: She appears well-developed and well-nourished. No distress.  HENT:  Head: Normocephalic and atraumatic.  Eyes:  Could not be assessed  Neck: JVD present.  Cardiovascular: Regular rhythm and intact distal pulses. Tachycardia present. Exam reveals gallop.  No murmur heard. Pulmonary/Chest: Effort normal and breath sounds normal. No respiratory distress. She has no wheezes. She has no rales.  Abdominal: Soft. Bowel sounds are normal. There is no rebound.  Musculoskeletal:        General: Edema (1+ n/l) present.  Lymphadenopathy:    She has no cervical adenopathy.  Neurological: She is alert. No cranial nerve deficit.  Arousable, but refuses to talk to me  Skin: Skin is warm and dry.  Psychiatric:  Not assessed   Nursing note and vitals reviewed.   Lab Results: BMP Recent Labs    10/30/18 0528 10/31/18 1436 11/01/18 1618  NA 137 135 132*  K 4.1 3.8 3.9  CL 105 97* 91*  CO2 20* 29 26  GLUCOSE 102* 115* 138*  BUN 25* 26* 17  CREATININE 1.43* 1.14* 1.16*  CALCIUM 8.5* 8.1* 8.1*  GFRNONAA 46* >60 59*  GFRAA 53* >60 >60    CBC Recent Labs  Lab 10/29/18 1235  11/02/18 0750  WBC 10.3   < > 15.8*  RBC 5.19*   < > 4.60  HGB 12.8   < > 11.4*  HCT 41.1   < > 34.9*  PLT 216   < > 226  MCV 79.2*   < > 75.9*  MCH 24.7*   < > 24.8*  MCHC 31.1   < > 32.7  RDW 17.9*   < > 16.9*  LYMPHSABS 1.5  --   --   MONOABS 0.8  --   --    EOSABS 0.0  --   --   BASOSABS 0.1  --   --    < > = values in this interval not displayed.    HEMOGLOBIN A1C Lab Results  Component Value Date   HGBA1C 5.8 (H) 08/09/2018   MPG 119.76 08/09/2018    Cardiac Panel (last 3 results) Recent Labs    10/29/18 1607 10/30/18 0528 10/30/18 1645  TROPONINI 0.32* 0.62* 0.65*    BNP (last 3 results) Recent Labs    08/13/18 0358 10/05/18 0724 10/29/18 1235  BNP 1,153.1* 789.4* 2,729.9*    TSH Recent Labs    08/09/18 1650 10/29/18 1607  TSH 0.492 0.646    Lipid Panel     Component Value Date/Time   CHOL 121 12/21/2014 0700   TRIG 66 12/21/2014 0700   HDL 53 12/21/2014 0700   CHOLHDL 2.3 12/21/2014 0700   VLDL 13 12/21/2014 0700   LDLCALC 55 12/21/2014 0700     Hepatic Function Panel Recent Labs    08/10/18 0303 08/23/18 0001 10/05/18 0724  PROT 6.0* 8.5* 6.8  ALBUMIN 2.7* 3.7 2.8*  AST 24 30 34  ALT 22 20  38  ALKPHOS 77 89 91  BILITOT 0.8 1.2 0.8    CARDIAC STUDIES:  EKG 10/29/2018: Sinus tachycardia 138 bpm.  Left atrial enlargement.  Old anteroseptal infarct.  Ventricular trigeminy.  Nonspecific ST-T changes.  Echocardiogram 08/10/2018: Mild LV dilatation.  EF 20-25%.  Global diffuse hypokinesis.  Restrictive physiology. Mild RV dilatation.  Moderate RV systolic dysfunction.  Severe left atrial dilatation.  Mild right atrial dilatation. Severe tricuspid regurgitation. Severe pulmonary hypertension RVSP 66 mmHg. Trivial pericardial effusion   Assessment & Recommendations:  43 y.o. African-American female  with polysubstance abuse, hypertension, history of hyperthyroidism, bipolar disorder, schizophrenia, known systolic biventricular heart failure, severe pulmonary hypertension, admitted with acute on chronic systolic heart failure.  Acute on chronic systolic and diastolic, biventricular heart failure: Etiology of cardiomyopathy could be cocaine induced, hypertensive, or ischemic. She has  diureses well. Agree with holding lasix Recommend spironolactone 25 mg daily. Continue Bidil to 1 tab tid for afterload reduction. She is currently in class IV HF. I would await further improvement before adding Entresto. Stop carvedilol , given her borderline low blood pressure. May need to hold other agents as well, if hypotension and sepsis ensues. Continue digoxin 0.125 mg daily for now. This will likely be discontinued on discharge. Recommend daily weights and strict intake/output. 2 g sodium diet.  Tachycardia: Predominantly sinus tachycardia, secondary to heart failure decompensation, as well as acute pulmonary emboli.  Acute pulmonary emboli/cavitating lesion. No hemodynamic compromise. Anticoagulation as per the primary team I am concerned with large arterial septic emboli in a patient with h/o drug abuse. She now has Tmax 100F, WBC count 15k. Lung abscess, as well as endocarditis are in the differential. Recommend limited transthoracic echocardiogram to look for vegetations.  I would advice against TEE unless it would change management.    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  Improving.  Troponin elevation: Likely type II MI due to heart failure exacerbation. ACS unlikely.  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  Continue management as above.  Bipolar disorder, schizophrenia, polysubstance abuse, homelessness: These remain significant barriers for her care and likely precipitating her medication and diet noncompliance.  She will need assistance from Technical sales engineer. She remains at high risk for readmission.  Her overall prognosis remains extremely guarded. Her management is limited by her continual refusal of necessary labs, EKG, telemetry.   Elder Negus, M.D. 11/02/2018, 9:29 AM [facility] Pager: [phone] Office: [phone] If no answer: [phone]

## 2018-11-02 NOTE — Progress Notes (Signed)
RN in room to give patient her morning meds but patint is refusing, says I should come back later, she wants to sleep.

## 2018-11-02 NOTE — Progress Notes (Signed)
Patient HR 138-139, and B/P 96/73. MD Manish made aware, order RN to hold all blood pressure related medications, gave permission to echo tech to do ECHO with the above HR. Will continue to monitor patient.

## 2018-11-02 NOTE — Progress Notes (Addendum)
FPTS Interim Progress Note  Went to speak with patient after I was notified that she is refusing her PICC line.  Unfortunately, patient was asleep and would not wake up for me, but I did speak with her nurse, who said that they have IV access in each arm and will give milrinone through one IV and vancomycin through the other.  They have collected blood cultures as well.  They will continue trying to get her permission for a PICC line and will place one if she allows.  Although patient does not have medical decision-making capacity, we do not want to force her to undergo a procedure that she does not want.  Peripheral IV for milrinone is not ideal for its administration, and patient will not get the maximum benefit from milrinone this way.  Due to patient's high burden of illness and repeated refusal of treatment, we will consider consulting palliative for their recommendations in the morning.  Nishan Ovens C. Frances Furbish, MD PGY-2, [facility] 11/02/2018 5:46 PM

## 2018-11-02 NOTE — Progress Notes (Signed)
Spoke with Lajuan Lines at [phone], patient's preferred contact.  Ms. Earlene Plater says that she is the half sister to one of Ms. Brege's sons and is not her daughter.  She also has custody of Ms. Justman's son (who is Ms. Davis's half brother).  She says that although she is not related to Ms. Postel, Ms. Bordwell is not in contact with her family.  She does have 3 children who are in their 30s, but Ms. Earlene Plater does not know what their numbers are.  Ms. Earlene Plater says that if we need her to give permission for facility placement for Ms. Thompson, she will be happy to do that, but she does not want to be responsible for making many medical decisions for her if possible.  She says that the worst thing for the patient would be for her to go back out to the street from the hospital, since she will undoubtedly self medicate with illicit drugs.  She does say that she will try to look into contacting Ms. Locken's grown children.  She says that she will be happy to talk with Ms. Read's medical team and social work further if we need her input on placement decisions.  Amanda C. Frances Furbish, MD PGY-2, [facility] 11/02/2018 12:10 PM

## 2018-11-02 NOTE — Progress Notes (Signed)
Pharmacy Antibiotic Note:  Vancomycin Initiation  Christine Cobb is a 43 y.o. female admitted on 10/29/2018 with CHF exacerbation (acute on chronic systolic and diastolic, biventricular heart failure; LVEF today is 10-15%) and acute PE/cavitating lesion. Pt now with increasing temperature and elevated WBC. Given hx of substance abuse, suspecting large septic emboli.  Pharmacy has been consulted for vancomycin dosing. Goal vancomycin AUC = 400-550.  Other PMH includes: HTN, acute on CKD (nearly back to BL of 1.1), COPD, hyperthyroidism, psychiatric history (severe bipolar disorder, schizoaffective disorder), homelessness  Plan: Vancomycin 1250 mg IV X 1, followed by 1 gram IV Q 24 hrs (expected vancomycin AUC = 465.2)  Height: 5\' 4"  (162.6 cm) Weight: 130 lb 1.6 oz (59 kg) IBW/kg (Calculated) : 54.7  Temp (24hrs), Avg:99.5 F (37.5 C), Min:98.8 F (37.1 C), Max:100 F (37.8 C)  Recent Labs  Lab 10/29/18 1235 10/30/18 0528 10/31/18 1436 11/01/18 1618 11/02/18 0750 11/02/18 1528  WBC 10.3 10.2  --   --  15.8*  --   CREATININE 1.39* 1.43* 1.14* 1.16*  --   --   LATICACIDVEN  --   --   --   --   --  2.1*    Estimated Creatinine Clearance: 55.7 mL/min (A) (by C-G formula based on SCr of 1.16 mg/dL (H)).    Allergies  Allergen Reactions  . Amoxil [Amoxicillin] Anaphylaxis    Did it involve swelling of the face/tongue/throat, SOB, or low BP? yes Did it involve sudden or severe rash/hives, skin peeling, or any reaction on the inside of your mouth or nose? yes Did you need to seek medical attention at a hospital or doctor's office? unknown When did it last happen?unknoen If all above answers are "NO", may proceed with cephalosporin use.   Marland Kitchen Hydroxyzine     Other reaction(s): Bleeding  . Ketorolac Tromethamine Hives and Swelling  . Prenatal [B-Plex Plus] Nausea Only  . Tegretol [Carbamazepine] Other (See Comments)    Swelling, skin peeling, skin discoloration  . B-Plex  Plus Nausea Only    Prenatal   . Tegretol [Carbamazepine] Swelling and Other (See Comments)    Skin peeling Skin discoloring     Antimicrobials this admission: 05/07 Vancomycin >>    Dose adjustments this admission: None  Microbiology results: 05/07 BCx X 2: pending 05/03: COVID: negative  Thank you for allowing pharmacy to be a part of this patient's care.  Vicki Mallet, PharmD, BCPS, [facility] Clinical Pharmacist 11/02/2018 4:38 PM

## 2018-11-02 NOTE — Progress Notes (Signed)
  Echocardiogram 2D Echocardiogram has been performed.  Celene Skeen 11/02/2018, 2:07 PM

## 2018-11-02 NOTE — Progress Notes (Signed)
FTPS Interim Progress Note:   Spoke with cardiology, Dr. Rosemary Holms, about his clinical concern for circulatory shock, presumed cardiogenic, with poor cardiac output in the setting of her decompensated heart failure and bilateral PEs.  EF now 10-15% on echocardiogram today, previously 20-25%.  Plan per Dr. Damian Leavell note, including starting milrinone via PICC line for blood pressure support and stopping additional heart failure medications.  In addition, patient has had an increasing temperature over the course of today, T-max reaching 100 F, with a new leukocytosis of 15.8 from 10 yesterday.  With bilateral PEs of unclear etiology, are considering septic emboli until proven otherwise with her extensive history of IV drug use.  TTE not showing any evidence of vegetations.  Discussed this with Dr. Luciana Axe, infectious disease, who recommended proceeding with TEE and starting vancomycin for coverage.  Note CTA recently showing evidence of consolidations concerning for possible pneumonia/pulmonary abscess, however when we discussed with radiologist felt this was more likely [place] hump/related to changes resulting from PE, but will continue on differential.  Proceeding with TEE is a challenged by her current clinical state and lack of capacity to consent for medical interventions.  It is currently most important to help her get out of this shock state and cover empirically at this time.  Long-term prognosis remains poor.  Plan as discussed above: - Milrinone via PICC, hold heart failure medications - Start IV vancomycin - Collect blood cultures prior to - Monitor vitals and fever curve - Consider TEE in future   Appreciate excellent support and care by cardiology team and RN.  Will continue to monitor the patient closely.   Allayne Stack, DO  Family medicine PGY 1

## 2018-11-02 NOTE — Progress Notes (Signed)
Pharmacy Consult for Milrinone (Primacor) Initiation  Indication:   Acute Decompensated Heart Failure with volume overload and low cardiac output  Allergies  Allergen Reactions  . Amoxil [Amoxicillin] Anaphylaxis    Did it involve swelling of the face/tongue/throat, SOB, or low BP? yes Did it involve sudden or severe rash/hives, skin peeling, or any reaction on the inside of your mouth or nose? yes Did you need to seek medical attention at a hospital or doctor's office? unknown When did it last happen?unknoen If all above answers are "NO", may proceed with cephalosporin use.   Marland Kitchen Hydroxyzine     Other reaction(s): Bleeding  . Ketorolac Tromethamine Hives and Swelling  . Prenatal [B-Plex Plus] Nausea Only  . Tegretol [Carbamazepine] Other (See Comments)    Swelling, skin peeling, skin discoloration  . B-Plex Plus Nausea Only    Prenatal   . Tegretol [Carbamazepine] Swelling and Other (See Comments)    Skin peeling Skin discoloring     Temp:  [98.8 F (37.1 C)-100 F (37.8 C)] 98.8 F (37.1 C) (05/07 1132) Pulse Rate:  [132-165] 132 (05/07 1132) Resp:  [16-20] 16 (05/07 1132) BP: (91-116)/(68-86) 96/73 (05/07 1315) SpO2:  [93 %-98 %] 96 % (05/07 1132) Weight:  [130 lb 1.6 oz (59 kg)] 130 lb 1.6 oz (59 kg) (05/07 0023)  LABS    Component Value Date/Time   NA 132 (L) 11/01/2018 1618   K 3.9 11/01/2018 1618   CL 91 (L) 11/01/2018 1618   CO2 26 11/01/2018 1618   GLUCOSE 138 (H) 11/01/2018 1618   BUN 17 11/01/2018 1618   CREATININE 1.16 (H) 11/01/2018 1618   CREATININE 0.66 04/19/2013 1354   CALCIUM 8.1 (L) 11/01/2018 1618   GFRNONAA 59 (L) 11/01/2018 1618   GFRAA >60 11/01/2018 1618   Last magnesium:  Lab Results  Component Value Date   MG 1.7 08/11/2018   Estimated Creatinine Clearance: 55.7 mL/min (A) (by C-G formula based on SCr of 1.16 mg/dL (H)). Serum creatinine: 1.16 mg/dL (H) 09/38/18 2993 Estimated creatinine clearance: 55.7 mL/min (A) estimated  creatinine clearance is 55.7 mL/min (A) (by C-G formula based on SCr of 1.16 mg/dL (H)).   Intake/Output Summary (Last 24 hours) at 11/02/2018 1612 Last data filed at 11/02/2018 0900 Gross per 24 hour  Intake 1573 ml  Output 301 ml  Net 1272 ml    Filed Weights   10/31/18 0245 11/01/18 0444 11/02/18 0023  Weight: 146 lb 4.8 oz (66.4 kg) 137 lb 8 oz (62.4 kg) 130 lb 1.6 oz (59 kg)    Assessment:  43 y.o. female admitted 10/29/2018 with acute decompensated congestive heart failure to be initiated on milrinone.  Patient with EF 10% and SOB to start on milrinone. Pharmacy consulted to assist with dosing. Advanced HF team to evaluate tomorrow. All HF meds on hold for now. SCr has been stable ~1.16 (CrCl ~55 ml/min). Call physician for replacement if potassium is < 4 or magnesium is < 2 and replacement has not already been ordered.  Milrinone can cause arrhythmias.  Monitor patient for ECG changes.  Plan is to initiate milrinone for inotropic support.  Plan:  1. Initiate milrinone based on renal function: (Consider starting dose of 0.125 - 0.25 for patients with SBP <138mmHg) [facility] One Calculated CrCl Dose  []  > 50 ml/min 0.375 mcg/kg/min  [x]  20-49 ml/min 0.250 mcg/kg/min  []  < 20 ml/min 0.125 mcg/kg/min   2. Nursing to monitor vital signs per milrinone protocol and physician parameters. 3. Pharmacy to  follow peripherally, please reconsult if needed or there is further questions. 4.  Please contact MD for further dosing instructions.  Thank you for allowing [place] to be a part of this patient's care.   Fredonia Highland, PharmD, BCPS Clinical Pharmacist [phone] Please check AMION for all [facility]acy numbers 11/02/2018

## 2018-11-02 NOTE — Progress Notes (Signed)
Pt. Educated about fluid restriction and need to stay within limit. Pt. Continues to request ice and fluids to drink.

## 2018-11-02 NOTE — Plan of Care (Signed)
  Problem: Activity: Goal: Capacity to carry out activities will improve Outcome: Progressing   Problem: Nutrition: Goal: Adequate nutrition will be maintained Outcome: Progressing   Problem: Health Behavior/Discharge Planning: Goal: Ability to manage health-related needs will improve Outcome: Not Progressing

## 2018-11-02 NOTE — Progress Notes (Signed)
Nutrition Follow-up  RD working remotely.  DOCUMENTATION CODES:   Not applicable  INTERVENTION:   -Continue MVI with minerals daily -Continue Ensure Enlive po TID, each supplement provides 350 kcal and 20 grams of protein  NUTRITION DIAGNOSIS:   Predicted suboptimal nutrient intake related to social / environmental circumstances as evidenced by per patient/family report.  Ongoing  GOAL:   Patient will meet greater than or equal to 90% of their needs  Progressing  MONITOR:   PO intake, Supplement acceptance, Labs, Weight trends, Skin, I & O's  REASON FOR ASSESSMENT:   Malnutrition Screening Tool    ASSESSMENT:   Christine Cobb is a 43 y.o. female presenting with worsening peripheral edema and dyspnea on exertion. PMH is significant for current every day smoker with history of COPD, HFrEF (EF 20-25%), HTN, Hyperthyroidism, bipolar mood disorder, schizoaffective disorder, and polysubstance abuse.   Reviewed I/O's: +52 ml x 24 hours and -4.4 L since admission  UOP: 1.3 L since admission  Pt continues to refuse aspects of care. Per MD notes, unsure if pt has the capacity to make her own decisions and has little insight into her medical problems. Psychiatry consult completed 11/01/18; pt does not demonstrate capacity to refuse current treatment.  Pt remains with good appetite; po 100%. Pt also has Ensure supplements ordered per her request, which she consumes.   Labs reviewed: Na: 132.   Diet Order:   Diet Order            Diet Heart Room service appropriate? Yes; Fluid consistency: Thin; Fluid restriction: 1500 mL Fluid  Diet effective now              EDUCATION NEEDS:   No education needs have been identified at this time  Skin:  Skin Assessment: Reviewed RN Assessment  Last BM:  10/31/18  Height:   Ht Readings from Last 1 Encounters:  10/29/18 5\' 4"  (1.626 m)    Weight:   Wt Readings from Last 1 Encounters:  11/02/18 59 kg    Ideal Body  Weight:  54.5 kg  BMI:  Body mass index is 22.33 kg/m.  Estimated Nutritional Needs:   Kcal:  1700-1900  Protein:  90-105 grams  Fluid:  1.7-1.9 L    Shanoah Asbill A. Mayford Knife, RD, LDN, CDCES Registered Dietitian II Certified Diabetes Care and Education Specialist Pager: [phone] After hours Pager: [phone]

## 2018-11-03 LAB — CBC
HCT: 33.8 % — ABNORMAL LOW (ref 36.0–46.0)
Hemoglobin: 11 g/dL — ABNORMAL LOW (ref 12.0–15.0)
MCH: 25.1 pg — ABNORMAL LOW (ref 26.0–34.0)
MCHC: 32.5 g/dL (ref 30.0–36.0)
MCV: 77 fL — ABNORMAL LOW (ref 80.0–100.0)
Platelets: 276 10*3/uL (ref 150–400)
RBC: 4.39 MIL/uL (ref 3.87–5.11)
RDW: 17.1 % — ABNORMAL HIGH (ref 11.5–15.5)
WBC: 14.5 10*3/uL — ABNORMAL HIGH (ref 4.0–10.5)
nRBC: 0 % (ref 0.0–0.2)

## 2018-11-03 LAB — BASIC METABOLIC PANEL
Anion gap: 15 (ref 5–15)
BUN: 22 mg/dL — ABNORMAL HIGH (ref 6–20)
CO2: 27 mmol/L (ref 22–32)
Calcium: 8.3 mg/dL — ABNORMAL LOW (ref 8.9–10.3)
Chloride: 88 mmol/L — ABNORMAL LOW (ref 98–111)
Creatinine, Ser: 1.06 mg/dL — ABNORMAL HIGH (ref 0.44–1.00)
GFR calc Af Amer: >60 mL/min (ref >60)
GFR calc non Af Amer: >60 mL/min (ref >60)
Glucose, Bld: 120 mg/dL — ABNORMAL HIGH (ref 70–99)
Potassium: 4.3 mmol/L (ref 3.5–5.1)
Sodium: 130 mmol/L — ABNORMAL LOW (ref 135–145)

## 2018-11-03 LAB — SARS CORONAVIRUS 2 BY RT PCR (HOSPITAL ORDER, PERFORMED IN ~~LOC~~ HOSPITAL LAB): SARS Coronavirus 2: NEGATIVE

## 2018-11-03 MED ORDER — LEVOFLOXACIN IN D5W 750 MG/150ML IV SOLN
750.0000 mg | INTRAVENOUS | Status: DC
  Administered 2018-11-03 – 2018-11-05 (×3): 750 mg via INTRAVENOUS
  Filled 2018-11-03 (×4): qty 150

## 2018-11-03 MED ORDER — GUAIFENESIN-DM 100-10 MG/5ML PO SYRP
5.0000 mL | ORAL_SOLUTION | ORAL | Status: DC | PRN
  Administered 2018-11-03 – 2018-11-15 (×30): 5 mL via ORAL
  Filled 2018-11-03 (×31): qty 5

## 2018-11-03 NOTE — Progress Notes (Signed)
Pt. Removed IVs for medication administration. RN attempting to reinsert IVs for continuous infusion of medication. Pt. Refused. Pt. Stated "they itch and my arm was swelling when I took a shower". Pt. Also refuses to have telemetry monitor at this time.  On call for Family medicine made aware. RN awaiting further instruction. Will continue to monitor pt. For changes.

## 2018-11-03 NOTE — Progress Notes (Signed)
Nurse requested 2nd IV but pt refused. I informed nurse that IV team will return if the pt changes her mind.

## 2018-11-03 NOTE — Progress Notes (Signed)
Pt. Non-compliant with fluid restriction. Pt. Also refusing telemetry at this time, taking multiple showers. On call for family medicine aware.

## 2018-11-03 NOTE — Progress Notes (Signed)
PT. Found in shower after RN told her she should not go. New IV inserted. Pt. Stated last IV was lost because she had to take a shower.

## 2018-11-03 NOTE — Progress Notes (Signed)
Received a page at 2000 that pt had removed her PIVs.  Pt seen at bedside and informed/reminded of the importance of her IV medication.  She was informed that refusing IV medication at this time with an EF of 10% could lead to severe consequences and even death.  Pt understood and agreed to have the IV replaced.  RN aware.  Mirian Mo, MD

## 2018-11-03 NOTE — Progress Notes (Signed)
Subjective:  Continues to have shortness of breath Refused Pline  Objective:  Vital Signs in the last 24 hours: Temp:  [98.8 F (37.1 C)-100.2 F (37.9 C)] 99.3 F (37.4 C) (05/08 0740) Pulse Rate:  [62-144] 136 (05/08 0740) Resp:  [16-20] 19 (05/08 0740) BP: (91-134)/(68-99) 97/78 (05/08 0740) SpO2:  [95 %-100 %] 100 % (05/08 0740) Weight:  [57.8 kg] 57.8 kg (05/08 0510)  Intake/Output from previous day: 05/07 0701 - 05/08 0700 In: 1558.3 [P.O.:1518; I.V.:40.3] Out: 2200 [Urine:2200]  Physical Exam  Constitutional: She appears well-developed and well-nourished. No distress.  HENT:  Head: Normocephalic and atraumatic.  Neck: No appreciaable JVD, although exam is limited. .  Cardiovascular: Regular rhythm and intact distal pulses. Tachycardia present. Exam reveals gallop.  No murmur heard. Pulmonary/Chest: Effort normal and breath sounds normal. No respiratory distress. She has no wheezes. She has no rales.  Abdominal: Soft. Bowel sounds are normal. There is no rebound.  Musculoskeletal:        General: No edema present.  Lymphadenopathy:    She has no cervical adenopathy.  Neurological: She is alert. No gross neurodeficit Skin: Skin is warm and dry.  Psychiatric: Flat affect   Nursing note and vitals reviewed.   Lab Results: BMP Recent Labs    10/31/18 1436 11/01/18 1618 11/02/18 1916  NA 135 132* 130*  K 3.8 3.9 4.0  CL 97* 91* 88*  CO2 29 26 30   GLUCOSE 115* 138* 123*  BUN 26* 17 20  CREATININE 1.14* 1.16* 1.06*  CALCIUM 8.1* 8.1* 8.0*  GFRNONAA >60 59* >60  GFRAA >60 >60 >60    CBC Recent Labs  Lab 11/02/18 1916  WBC 14.4*  RBC 4.49  HGB 11.3*  HCT 34.4*  PLT 240  MCV 76.6*  MCH 25.2*  MCHC 32.8  RDW 17.2*  LYMPHSABS 1.3  MONOABS 1.7*  EOSABS 0.0  BASOSABS 0.0    HEMOGLOBIN A1C Lab Results  Component Value Date   HGBA1C 5.8 (H) 08/09/2018   MPG 119.76 08/09/2018    Cardiac Panel (last 3 results) Recent Labs    10/29/18 1607  10/30/18 0528 10/30/18 1645  TROPONINI 0.32* 0.62* 0.65*    BNP (last 3 results) Recent Labs    08/13/18 0358 10/05/18 0724 10/29/18 1235  BNP 1,153.1* 789.4* 2,729.9*    TSH Recent Labs    08/09/18 1650 10/29/18 1607  TSH 0.492 0.646    Lipid Panel     Component Value Date/Time   CHOL 121 12/21/2014 0700   TRIG 66 12/21/2014 0700   HDL 53 12/21/2014 0700   CHOLHDL 2.3 12/21/2014 0700   VLDL 13 12/21/2014 0700   LDLCALC 55 12/21/2014 0700     Hepatic Function Panel Recent Labs    08/10/18 0303 08/23/18 0001 10/05/18 0724  PROT 6.0* 8.5* 6.8  ALBUMIN 2.7* 3.7 2.8*  AST 24 30 34  ALT 22 20 38  ALKPHOS 77 89 91  BILITOT 0.8 1.2 0.8    CARDIAC STUDIES:  EKG 10/29/2018: Sinus tachycardia 138 bpm.  Left atrial enlargement.  Old anteroseptal infarct.  Ventricular trigeminy.  Nonspecific ST-T changes.  Echocardiogram 08/10/2018: Mild LV dilatation.  EF 20-25%.  Global diffuse hypokinesis.  Restrictive physiology. Mild RV dilatation.  Moderate RV systolic dysfunction.  Severe left atrial dilatation.  Mild right atrial dilatation. Severe tricuspid regurgitation. Severe pulmonary hypertension RVSP 66 mmHg. Trivial pericardial effusion   Assessment & Recommendations:  43 y.o. African-American female  with polysubstance abuse, hypertension, history of hyperthyroidism, bipolar  disorder, schizophrenia, known systolic biventricular heart failure, severe pulmonary hypertension, admitted with acute on chronic systolic heart failure.  Acute on chronic systolic and diastolic, biventricular heart failure: EF 10-15%. Lactic acid has improved. She is likely in impending shock. It is okay to continue milrinone through peripheral IV for now, but I do not foresee this to be a sustainable strategy. She has refused PICC line, and there is high likelihood that she will remove it, even once placed. Unfortunately, she does not have any option for advanced heart failure  treatment due to to her substance abuse and poor social support.  If blood pressure would allow, may start on low dose Entresto 24-26 mg bid, for its worth. I doubt we will be meaningfully uptitrate this, but she may at least get some benefit from low dose Entresto.  Other agents may be added very slowly, avoiding beta blockers for now.  Strongly recommend involving palliative care. Her prognosis is poor.  Recommend daily weights and strict intake/output. 2 g sodium diet.  Appreciate Dr. Alford Highland input  Troponin elevation: Likely type II MI due to heart failure exacerbation. ACS unlikely.  Tachycardia: Predominantly sinus tachycardia, secondary to heart failure decompensation, as well as acute pulmonary emboli.  Acute pulmonary emboli/cavitating lesion. No hemodynamic compromise. Anticoagulation as per the primary team No obvious vegetation seen on transthoracic echocardiogram.   Fever: Consider retesting for COVID.  AKI/CKD: Resolved.  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  Continue management as above.  Bipolar disorder, schizophrenia, polysubstance abuse, homelessness: These remain significant barriers for her care and likely precipitating her medication and diet noncompliance.  She will need assistance from Technical sales engineer. She remains at high risk for readmission.  Her overall prognosis remains extremely guarded. Her management is limited by her continual refusal of necessary labs, EKG, telemetry.   Discussed with the primary team.   Elder Negus, M.D. 11/03/2018, 8:32 AM [facility] Pager: [phone] Office: [phone] If no answer: [phone]

## 2018-11-03 NOTE — Progress Notes (Signed)
Patient still refusing to be on tele monitor, RN continues to  explained to patient the significant and reasons for the monitor, but she still refused. RN will continue to monitor and encourage patient.

## 2018-11-03 NOTE — Progress Notes (Signed)
Family Medicine Teaching Service Daily Progress Note Intern Pager: [phone]  Patient name: Christine Cobb Medical record number: [id number] Date of birth: [birth year] Age: 43 y.o. Gender: female  Primary Care Provider: Patient, No Pcp Per  Consultants: Cardiology Code Status: Full  Pt Overview and Major Events to Date:  5/3 Admitted to FPTS, CXR R>L pulmonary effusion, RUL opacity  Assessment and Plan: Christine Cobb is a 43 y.o. female presenting with worsening peripheral edema and dyspnea on exertion. PMH is significant for current every day smoker with history of COPD, HFrEF (EF 20-25%), HTN, Hyperthyroidism, bipolar mood disorder, schizoaffective disorder, and polysubstance abuse.   Lack of medical capacity:  Deemed to not have capacity for medical decision-making.  Patient has few to no family members/friends she remains in contact with and only provided 1 phone number, spoke with her youngest son's half-sister (phone number she gave) who did not want to be responsible for medical decision-making and did not have contact for any of her other children.  Patient has been refusing medical care intermittently including labs, telemetry, PICC line.  Prognosis is quite guarded and poor with multiple comorbidities and progressing heart failure with an EF of 10-15% not a candidate for any advanced heart failure care.  At this time, we are her current medical decision makers.  Will touch base with social work about different avenues of guardianship, will pursue IVC.  Additionally, with poor prognosis will aid in the help of palliative care. - Social work, appreciate recommendations -Pursue IVC -Consult palliative care as below  - Medical treatment as below  Concern for infection:  Differential mainly including endocarditis, pulmonary process/PNA. Elevated temperatures, T-max 100.2, generally remained around 31F. New leukocytosis of 15.8 yesterday as well, started on vancomycin for  empiric treatment of possible endocarditis given drug use and bilateral PEs present.  Leukocytosis trended down to 14.4 this morning. BC no growth <12 hours.  Pt does endorse a productive cough with blood-tinged sputum for the past few weeks, however appears to be present back in February in the ED.  CTA on 5/5 showing dense consolidation in RLL concerning for possible pneumonia and/or pulmonary abscess, but after speaking with radiology they thought it may be more related to pulmonary infarct with PE. However given current clinical status with increasing infectious concern, will treat empirically.  Additionally, will retest for COVID (COVID negative on admission), with symptoms as above. - Continue vancomycin, start cefepime IV - Continue to monitor blood cultures - Monitor vitals, fever curve - COVID testing, droplet and contact cautions for now - Monitor CBC  Small-medium sized bilateral LL PE: Acute. Started on Xarelto 5/6.  Long-term anticoagulation challenging given her poor compliance and support.  Concern for septic emboli given her history of drug use, TTE negative for vegetations on 5/7.  Patient would not be able to undergo a TEE at this current time given clinical status and inability to consent. - Continue Xarelto 15 mg daily (day 3)  - Continue age-appropriate cancer screenings - Consider TEE in the future  Acute on chronic HFrEF  Poor EF 10-15%: Stable currently. Echo 5/7 showing EF 10-15%.  Started on IV milrinone in DC'd heart failure medications per cardiology yesterday with concerns for impending cardiogenic shock with cool extremities and progressive decline in blood pressure.  Improved today, warm and perfused, blood pressure remained stable on milrinone (SBP 100-130 overnight) and LA trended to WNL.  Appears generally euvolemic on exam now.  Not a candidate for advanced heart failure care.  However, in general poor prognosis, will consult palliative care. -Consult palliative  care - Cardiology on board, appreciate recommendations and care -Continue IV milrinone, refused and would not tolerate PICC  - Continue to hold majority of heart failure medications including Lasix, BiDil, digoxin, spironolactone, and Coreg - Considering starting Entresto per cardiology, however BP 97/78 this morning, will monitor - Strict I and O's -Daily weights  Bipolar disorder  schizoaffective  MDD  anxiety: Chronic. More interactive in conversation today.  Evaluated by psychiatry, not felt to have capacity.  Her mental state is a large barrier to her medical care.  Restarted on Haldol, Abilify, and amantadine per psychiatry on 5/5.  - Appreciate psychiatry recommendations - IV Haldol monthly, received dose on 5/7 - Aripiprazole 10 mg daily -Amantadine 100 mg twice daily  Homelessness  No PCP:  - Social work on board, greatly appreciate help - Case management consulted, appreciate recommendations  Acute on CKD: Resolved. Cr 1.06, from 1.16.  Generally around baseline. - Monitor BMP, nighttime labs -Avoid nephrotoxic medications as possible  HTN: Chronic, stable. Low-normotensive in the setting of above, SBP 90-120s.  - Monitor BP - IV milrinone as above  COPD: Chronic, stable. No wheezing appreciated, on RA.  Takes albuterol as needed.  - Albuterol as needed  - likely sent home with Spiriva at discharge  Polysubstance Abuse: Chronic. Current daily smoker, history of cocaine and MJ use.  UDS positive for cocaine on admit. - Nicotine patch 7 mg - Continue to encourage cessation, patient has voiced the want to discontinue  FEN/GI: heart healthy diet PPx: Full dose Xarelto  Disposition: Continued medical management, disposition help with social work  Subjective:  No acute events overnight, showered approximately 6 times per nursing.  Shows she feels terrible this morning, continues to complain of shortness of breath and continual productive coughing that is yellow  or blood-tinged in color.  So that is been present for the last few weeks.  Denies any abdominal pain, dysuria, open wounds.  Happy that her lower extremity swelling has improved.  And asking if she has any additional family members or friends that we could contact, says she she has already given the one number that she has.  Objective: Temp:  [98.8 F (37.1 C)-100.2 F (37.9 C)] 99.2 F (37.3 C) (05/08 0505) Pulse Rate:  [62-144] 135 (05/08 0505) Resp:  [16-20] 18 (05/08 0505) BP: (91-134)/(68-99) 119/76 (05/08 0505) SpO2:  [95 %-99 %] 95 % (05/08 0505) Weight:  [57.8 kg] 57.8 kg (05/08 0510) Physical Exam: General: Middle-aged female, appears uncomfortable and frustrated sitting up in the bed HEENT: NCAT, MMM, right eye with consistent lateral gaze Cardiac: Tachycardic, regular rhythm without murmurs, gallops noted Lungs: Generally clear throughout with improved air movement, scattered rhonchi/crackles.  Mild increased work of breathing, on RA. Abdomen: soft, non-tender, non-distended, normoactive BS Msk: Moves all extremities spontaneously  Ext: Warm, dry, 2+ distal pulses, trace edema to her bilateral feet  Psych: Interactive with conversation today, answers questions appropriately  Laboratory: Recent Labs  Lab 10/30/18 0528 11/02/18 0750 11/02/18 1916  WBC 10.2 15.8* 14.4*  HGB 12.1 11.4* 11.3*  HCT 36.8 34.9* 34.4*  PLT 202 226 240   Recent Labs  Lab 10/31/18 1436 11/01/18 1618 11/02/18 1916  NA 135 132* 130*  K 3.8 3.9 4.0  CL 97* 91* 88*  CO2 29 26 30   BUN 26* 17 20  CREATININE 1.14* 1.16* 1.06*  CALCIUM 8.1* 8.1* 8.0*  GLUCOSE 115* 138* 123*  Imaging/Diagnostic Tests: Ct Angio Chest Pe W Or Wo Contrast  Result Date: 10/31/2018 CLINICAL DATA:  PE suspected EXAM: CT ANGIOGRAPHY CHEST WITH CONTRAST TECHNIQUE: Multidetector CT imaging of the chest was performed using the standard protocol during bolus administration of intravenous contrast. Multiplanar CT  image reconstructions and MIPs were obtained to evaluate the vascular anatomy. CONTRAST:  80mL OMNIPAQUE IOHEXOL 350 MG/ML SOLN COMPARISON:  Chest x-ray 10/29/2018.  Chest CT 08/10/2018 FINDINGS: Cardiovascular: Filling defects are noted within bilateral lower lobe pulmonary arteries compatible with pulmonary emboli. No evidence of right heart strain. Heart is enlarged. Aorta is normal caliber. Mediastinum/Nodes: No mediastinal, hilar, or axillary adenopathy. Lungs/Pleura: Large right pleural effusion and small left pleural effusion. Area of masslike consolidation in the right lung base with locules of gas measures 4.7 x 4.5 cm. There are scattered ground-glass airspace opacities in the peripheral inferior right upper lobe and in the right lower lobe. This could be infectious or related to pulmonary infarcts. Peripheral ground-glass opacity also noted posteriorly in the left lower lobe which again could be infectious or related to pulmonary infarct. Upper Abdomen: Evaluation limited due to artifact related to the cardiac monitor sitting on the anterior abdomen of the patient. Musculoskeletal: Chest wall soft tissues are unremarkable. No acute bony abnormality. Review of the MIP images confirms the above findings. IMPRESSION: Small to medium-sized bilateral lower lobe pulmonary emboli. No evidence of right heart strain. Cardiomegaly. Large right pleural effusion and small left pleural effusion. Dense consolidation in the right lower lobe with masslike consolidation and locules of gas noted. This area could reflect pneumonia and/or pulmonary abscess. Scattered ground-glass opacities in the lungs could be infectious or related to pulmonary infarcts. These results will be called to the ordering clinician or representative by the Radiologist Assistant, and communication documented in the PACS or zVision Dashboard. Electronically Signed   By: Charlett Nose M.D.   On: 10/31/2018 22:58   [place] Ekg Site Rite  Result Date:  11/02/2018 If Site Rite image not attached, placement could not be confirmed due to current cardiac rhythm.   Allayne Stack, DO 11/03/2018, 7:29 AM PGY-1, [facility] FPTS Intern pager: [phone], text pages welcome

## 2018-11-03 NOTE — TOC Progression Note (Addendum)
Transition of Care [facility]) - Progression Note    Christine Cobb Details  Name: Christine Cobb MRN: [id number] Date of Birth: Dec 04, 1975  Transition of Care [facility]) CM/SW Contact  Margarito Liner, LCSW Phone Number: 11/03/2018, 1:36 PM  Clinical Narrative: Left second voicemails for Christine Cobb and Christine Cobb that are listed in Christine Cobb's emergency contacts. The social work department no longer has access to the [employer]. CSW left voicemail at [facility] CPS. Christine Cobb has been reported to them in the past. Hoping they have access to her children's contact information. Will update once call returned.  3:57 pm: Called and spoke to Christine Cobb. She stated that she does not know Christine Cobb's son's name. She has been going out to where Christine Cobb hangs out to find out information and learned that her mother is still living. No one knew her name. Christine Cobb stated that Christine Cobb was never married to her father so her last name is not "Cianci." She stated that she was married to a [place]. 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 Chart review revealed that Christine Cobb was married to a [employer]. With supervisor's permission, located his chart and found that his phone number is [phone]. RN and CSW went in room and spoke with Christine Cobb. She is very alert today. Initially she did not want to provide family's names because she said she did not trust CSW. RN and CSW discussed concern for her health and possibility of not being able to make decisions in the future. Christine Cobb provided requested information. Her son's name is Christine Cobb (dob 01/17/1998). CSW was unable to locate a medical chart for him. Her mother's name is Christine Cobb and phone number is [phone]. Christine Cobb stated that she and Christine Cobb are still legally married. CSW called MD and provided this information.  Expected Discharge Plan: Homeless Shelter Barriers to Discharge: Continued Medical Work up  Expected Discharge Plan and  Services Expected Discharge Plan: Homeless Shelter   Discharge Planning Services: CM Consult Post Acute Care Choice: NA Living arrangements for the past 2 months: Homeless Shelter                   DME Agency: NA       HH Arranged: NA HH Agency: NA         Social Determinants of Health (SDOH) Interventions    Readmission Risk Interventions No flowsheet data found.

## 2018-11-03 NOTE — Progress Notes (Signed)
Interim Progress Note   Attempted to call the numbers provided by clinical social work for patient's mom and patient's husband.  Neither answer the phone and there was no option to leave voicemail.  We will continue to attempt contact to establish medical decision-maker  Genia Hotter, M.D.  Family Medicine  PGY-1 11/03/2018 5:25 PM

## 2018-11-04 DIAGNOSIS — I472 Ventricular tachycardia: Secondary | ICD-10-CM

## 2018-11-04 DIAGNOSIS — Z515 Encounter for palliative care: Secondary | ICD-10-CM

## 2018-11-04 DIAGNOSIS — I420 Dilated cardiomyopathy: Secondary | ICD-10-CM

## 2018-11-04 DIAGNOSIS — E871 Hypo-osmolality and hyponatremia: Secondary | ICD-10-CM

## 2018-11-04 LAB — CBC
HCT: 34.9 % — ABNORMAL LOW (ref 36.0–46.0)
Hemoglobin: 11.2 g/dL — ABNORMAL LOW (ref 12.0–15.0)
MCH: 24.8 pg — ABNORMAL LOW (ref 26.0–34.0)
MCHC: 32.1 g/dL (ref 30.0–36.0)
MCV: 77.4 fL — ABNORMAL LOW (ref 80.0–100.0)
Platelets: 310 10*3/uL (ref 150–400)
RBC: 4.51 MIL/uL (ref 3.87–5.11)
RDW: 17 % — ABNORMAL HIGH (ref 11.5–15.5)
WBC: 13.4 10*3/uL — ABNORMAL HIGH (ref 4.0–10.5)
nRBC: 0 % (ref 0.0–0.2)

## 2018-11-04 LAB — BASIC METABOLIC PANEL
Anion gap: 13 (ref 5–15)
BUN: 21 mg/dL — ABNORMAL HIGH (ref 6–20)
CO2: 26 mmol/L (ref 22–32)
Calcium: 8.3 mg/dL — ABNORMAL LOW (ref 8.9–10.3)
Chloride: 88 mmol/L — ABNORMAL LOW (ref 98–111)
Creatinine, Ser: 1.03 mg/dL — ABNORMAL HIGH (ref 0.44–1.00)
GFR calc Af Amer: >60 mL/min (ref >60)
GFR calc non Af Amer: >60 mL/min (ref >60)
Glucose, Bld: 125 mg/dL — ABNORMAL HIGH (ref 70–99)
Potassium: 4.6 mmol/L (ref 3.5–5.1)
Sodium: 127 mmol/L — ABNORMAL LOW (ref 135–145)

## 2018-11-04 MED ORDER — MORPHINE SULFATE (PF) 2 MG/ML IV SOLN
2.0000 mg | INTRAVENOUS | Status: AC
  Administered 2018-11-04: 21:00:00 2 mg via INTRAVENOUS
  Filled 2018-11-04: qty 1

## 2018-11-04 MED ORDER — HALOPERIDOL LACTATE 5 MG/ML IJ SOLN
1.0000 mg | Freq: Four times a day (QID) | INTRAMUSCULAR | Status: DC | PRN
  Administered 2018-11-05 – 2018-11-11 (×9): 1 mg via INTRAVENOUS
  Filled 2018-11-04 (×10): qty 1

## 2018-11-04 MED ORDER — TRAZODONE HCL 100 MG PO TABS
100.0000 mg | ORAL_TABLET | Freq: Every day | ORAL | Status: DC
  Administered 2018-11-04 – 2018-11-07 (×4): 100 mg via ORAL
  Filled 2018-11-04 (×5): qty 1

## 2018-11-04 MED ORDER — HALOPERIDOL LACTATE 5 MG/ML IJ SOLN
2.0000 mg | INTRAMUSCULAR | Status: AC
  Administered 2018-11-04: 2 mg via INTRAVENOUS
  Filled 2018-11-04: qty 1

## 2018-11-04 NOTE — Progress Notes (Signed)
Subjective:  Patient was pleasant and responsive today, allowed me to examine her.  States that she is starting to feel better.  No chest pain but still has cough and dyspnea..  Intake/Output from previous day:  I/O last 3 completed shifts: In: 3564.3 [P.O.:3231; I.V.:132.6; IV Piggyback:200.6] Out: 2200 [Urine:2200] No intake/output data recorded.  Blood pressure (!) 89/75, pulse (!) 123, temperature 97.6 F (36.4 C), temperature source Oral, resp. rate (!) 24, height 5' 4"  (1.626 m), weight 59.1 kg, last menstrual period 09/13/2018, SpO2 99 %. Physical Exam  Constitutional: She appears well-developed and well-nourished.  Appears older than stated age  Eyes: Conjunctivae are normal.  Neck: Neck supple. JVD present.  Cardiovascular: Tachycardia present. Exam reveals gallop.  No murmur heard. No leg edema.  Pulmonary/Chest: Effort normal.  Bronchial breath sounds right upper lobe, decreased breath sounds at the bases, scattered crackles, soft.  Abdominal: Soft. Bowel sounds are normal.  Skin: Skin is warm and dry.    Lab Results: BMP BNP (last 3 results) Recent Labs    08/13/18 0358 10/05/18 0724 10/29/18 1235  BNP 1,153.1* 789.4* 2,729.9*    ProBNP (last 3 results) No results for input(s): PROBNP in the last 8760 hours. BMP Latest Ref Rng & Units 11/03/2018 11/02/2018 11/01/2018  Glucose 70 - 99 mg/dL 120(H) 123(H) 138(H)  BUN 6 - 20 mg/dL 22(H) 20 17  Creatinine 0.44 - 1.00 mg/dL 1.06(H) 1.06(H) 1.16(H)  Sodium 135 - 145 mmol/L 130(L) 130(L) 132(L)  Potassium 3.5 - 5.1 mmol/L 4.3 4.0 3.9  Chloride 98 - 111 mmol/L 88(L) 88(L) 91(L)  CO2 22 - 32 mmol/L 27 30 26   Calcium 8.9 - 10.3 mg/dL 8.3(L) 8.0(L) 8.1(L)   Hepatic Function Latest Ref Rng & Units 10/05/2018 08/23/2018 08/10/2018  Total Protein 6.5 - 8.1 g/dL 6.8 8.5(H) 6.0(L)  Albumin 3.5 - 5.0 g/dL 2.8(L) 3.7 2.7(L)  AST 15 - 41 U/L 34 30 24  ALT 0 - 44 U/L 38 20 22  Alk Phosphatase 38 - 126 U/L 91 89 77  Total  Bilirubin 0.3 - 1.2 mg/dL 0.8 1.2 0.8  Bilirubin, Direct 0.0 - 0.3 mg/dL - - -   CBC Latest Ref Rng & Units 11/03/2018 11/02/2018 11/02/2018  WBC 4.0 - 10.5 K/uL 14.5(H) 14.4(H) 15.8(H)  Hemoglobin 12.0 - 15.0 g/dL 11.0(L) 11.3(L) 11.4(L)  Hematocrit 36.0 - 46.0 % 33.8(L) 34.4(L) 34.9(L)  Platelets 150 - 400 K/uL 276 240 226   Lipid Panel     Component Value Date/Time   CHOL 121 12/21/2014 0700   TRIG 66 12/21/2014 0700   HDL 53 12/21/2014 0700   CHOLHDL 2.3 12/21/2014 0700   VLDL 13 12/21/2014 0700   LDLCALC 55 12/21/2014 0700  HEMOGLOBIN A1C Lab Results  Component Value Date   HGBA1C 5.8 (H) 08/09/2018   MPG 119.76 08/09/2018   TSH Recent Labs    08/09/18 1650 10/29/18 1607  TSH 0.492 0.646   Imaging: [place] Ekg Site Rite  Result Date: 11/02/2018 If Site Rite image not attached, placement could not be confirmed due to current cardiac rhythm.   Cardiac Studies: EKG 10/29/2018: Sinus tachycardia 138 bpm. Left atrial enlargement. Old anteroseptal infarct. Ventricular trigeminy. Nonspecific ST-T changes.  Telemetry: 7 beat NSVT 11/01/2018, continues to remain tachycardic.  Echocardiogram 08/10/2018: Mild LV dilatation. EF 20-25%. Global diffuse hypokinesis. Restrictive physiology. Mild RV dilatation. Moderate RV systolic dysfunction.  Severe left atrial dilatation. Mild right atrial dilatation. Severe tricuspid regurgitation. Severe pulmonary hypertension RVSP 66 mmHg. Trivial pericardial effusion  Scheduled  Meds: . amantadine  100 mg Oral BID  . ARIPiprazole  10 mg Oral Daily  . feeding supplement (ENSURE ENLIVE)  237 mL Oral TID BM  . multivitamin with minerals  1 tablet Oral Daily  . nicotine  7 mg Transdermal Daily  . Rivaroxaban  15 mg Oral BID WC   Continuous Infusions: . levofloxacin (LEVAQUIN) IV 750 mg (11/03/18 1244)  . milrinone 0.25 mcg/kg/min (11/03/18 1303)  . vancomycin 1,000 mg (11/03/18 1706)   PRN Meds:.acetaminophen, benzonatate,  guaiFENesin-dextromethorphan, Melatonin  Assessment/Plan:  1.  Acute on chronic systolic and diastolic heart failure, involving the left ventricle 2.  Acute pulmonary embolism, abnormal CTA 11/01/2018 revealing pulmonary embolism and cavitary pneumonia and large right pleural effusion 3.  Cavitating pneumonia, new findings of cavitary pneumonia compared to February CTA.  Agree with teaching service evaluation and plan, endocarditis is certainly possible but agree also that it will not change therapy and patient is not a candidate for TEE. 4. NSVT  Recommendation: Patient's refusal on taking medications is a big barrier for medical therapy and also patient continues to have substance use disorder.  Her lungs are much improved, leg edema is resolved, renal function is stable, if the IV access is lost, would not start milrinone again.    She was also noted to have NSVT on telemetry, she is now off of telemetry and patient refuses placement of telemetry.  I would recommend transferring to regular medical floor once milrinone is taken off which I will discontinue by the end of the day today.    She has no capacity in making medical decisions, but it would be certainly appropriate to make her DNR in the event of cardiac arrest.  I will request primary team to agree with me on this and I will be happy to place the order.  It would not be appropriate to perform CPR in a patient with all this underlying medical comorbidity with extremely guarded long-term prognosis to begin with.  Blood pressure is soft to start any meaningful guideline directed medical therapy for CHF.  Also compliance is a major issue.  Hence I did not make any changes to her regimen today.  Adrian Prows, M.D. 11/04/2018, 9:15 AM [facility] Pager: [phone] Office: [phone] If no answer: [phone]

## 2018-11-04 NOTE — Progress Notes (Signed)
Patient refusing bed alarm, educated about importance but she is still refusing. Will continue to monitor.  Jerika Wales, RN

## 2018-11-04 NOTE — Progress Notes (Signed)
Family Medicine Teaching Service Daily Progress Note Intern Pager: [phone]  Patient name: Christine Cobb Medical record number: [id number] Date of birth: October 12, 1975 Age: 43 y.o. Gender: female  Primary Care Provider: Patient, No Pcp Per  Consultants: Cardiology Code Status: Full  Pt Overview and Major Events to Date:  5/3 Admitted to FPTS, CXR R>L pulmonary effusion, RUL opacity  Assessment and Plan: Christine Cobb is a 43 y.o. female presenting with worsening peripheral edema and dyspnea on exertion. PMH is significant for current every day smoker with history of COPD, HFrEF (EF 20-25%), HTN, Hyperthyroidism, bipolar mood disorder, schizoaffective disorder, and polysubstance abuse.   Lack of medical capacity:  Deemed by psychiatry to not have capacity for medical decision-making, however demonstrates insight during evaluation this morning.  We will continue to monitor.  At this time, we are her current medical decision makers, still in the process of reaching out to family with the help with social work.  - Social work on Theatre manager, appreciate additional support - Plan of care consulted as below - Pursue IVC if patient attempts to leave AMA  RLL PNA +/- Endocarditis: Acute, stable.  Afebrile, T-max has been 100.2.  Continued productive coughing.  Leukocytosis stable, 14.5 from 14.4.  BC NG <24 hours. COVID neg x2.  Clinical presentation likely secondary to pneumonia noted on CTA 5/5, continuing to consider endocarditis in the differential with bilateral PEs and history of drug use, but reassured TTE negative for vegetations and BC no growth thus far. Not currently appropriate for a TEE. - Continue IV vancomycin (5/7-), will need to have a future discussion on duration - Continue IV Levaquin (5/8-) for pneumonia (cefepime not started due to anaphylaxis with amoxicillin)  - Monitor blood cultures - Vitals per routine, monitor fever curve -Tessalon Perles as needed -  CBC  Small-medium sized bilateral LL PE: Acute. Long-term anticoagulation challenging given her poor compliance and support.  Currently considered septic emboli until proven otherwise with history of drug use. TTE negative for vegetations, but with severe tricuspid regurg.  - Continue Xarelto 15 mg BID (5/6-, day 4)  - Continue age-appropriate cancer screenings -IV vancomycin as above - Consider TEE in the future, clinical status not permitting  Acute on chronic HFrEF  Poor EF 10-15%: Stable currently. Echo 5/7 showing EF 10-15%.  On IV milrinone per cardiology, SBP low of 80's overnight, however improved this a.m. Appears euvolemic now.  Not a candidate for advanced heart failure care.  Poor prognosis regardless, palliative care consulted. -Palliative care consulted, tissue is with him today -Cardiology on board, appreciate recommendations and care -Continue IV milrinone -Continue to hold heart failure medications: Lasix, BiDil, digoxin, spironolactone, and Coreg -We will monitor for starting Entresto, however blood pressure borderline to low -Strict I and O's -Daily weights  Bipolar disorder  schizoaffective  MDD  anxiety: Chronic. Her mental state is a large barrier to her medical care, however much more interactive the past 2 days, likely as medications are taking effect.  Not take anything prior to admit, meds as below started by psychiatry. - IV Haldol monthly, received dose on 5/7 - Aripiprazole 10 mg daily (5/5-) -Amantadine 100 mg twice daily (5/5-)  Homelessness  No PCP:  - Social work on board, greatly appreciate help - Case management consulted, appreciate recommendations  Acute on CKD: Resolved. Cr 1.06, near baseline.  - Monitor BMP, nighttime labs -Avoid nephrotoxic medications as possible  HTN: Chronic, stable. Low-normotensive in the setting of above, SBP average 100s.  -  Monitor BP - IV milrinone as above  COPD: Chronic, stable. No current  symptomatology. - Albuterol as needed  - likely sent home with Spiriva at discharge  Polysubstance Abuse: Chronic. Current daily smoker, history of cocaine and MJ use. UDS positive for cocaine on admit. - Nicotine patch 7 mg - Continue to encourage cessation, patient has voiced the want to discontinue  FEN/GI: heart healthy diet PPx: Full dose Xarelto  Disposition: Continued medical management, disposition help with social work  Subjective:  No acute events overnight, the exception had to have her IV replaced and did not get second IV.  Otherwise, she says she is doing terrible this morning.  He is very frustrated with her shortness of breath, however is happy her foot swelling has resolved.  Notes her feet still hurt.  Still coughing with yellow/blood-tinged sputum.  She is able to talk to me about her medical conditions and her poor prognosis, says she knows that she may die with everything going on.  However her goals moving forward would to be stop doing drugs completely, have some where more stable to live, and feel better.  She expresses remorse for her previous life choices contributing to her current medical conditions and wants a second chance.  States she previously only used cocaine and weed, refuses ever using any IV drugs.  Discussed placing a second IV so that she could continue to receive antibiotics and her milrinone, states that she was okay with this as she understands the importance of the antibiotics for her infection.  Additionally talked to her about people that we could get in contact with, says she is married to a Mr. Christine Cobb, however says she has not spoken with him in about 5 years and says they are separated.  She has 2 sons, one a few years old and one is 3220, says she spoke with her older son maybe about a month ago.  Objective: Temp:  [97.5 F (36.4 C)-98.4 F (36.9 C)] 97.6 F (36.4 C) (05/09 0446) Pulse Rate:  [123-141] 123 (05/09 0446) Resp:  [21-24] 24 (05/09  0446) BP: (89-125)/(75-93) 89/75 (05/09 0446) SpO2:  [98 %-100 %] 99 % (05/09 0446) Weight:  [59.1 kg] 59.1 kg (05/09 0204) Physical Exam: General: Alert, middle-aged female, appears uncomfortable but in no acute distress HEENT: NCAT, MMM   Cardiac: Tachycardic, regular rhythm without murmurs, or gallops noted. Lungs: Generally clear throughout with improved air movement from previous, scattered rhonchi/minor crackles in the left lower lobe.  No increased work of breathing laying on left lateral decubitus, on RA. Abdomen: soft, non-tender, non-distended, normoactive BS Msk: Moves all extremities spontaneously  Ext: Warm, dry, 2+ distal pulses, no edema Psych: Alert and pleasant, fully interactive with conversation today and answers questions appropriately.  Demonstrates insight into her current clinical status and medical conditions, able to discuss the consequences to not receiving treatments including death, but also realizes her prognosis is poor even with medications.   Laboratory: Recent Labs  Lab 11/02/18 0750 11/02/18 1916 11/03/18 1816  WBC 15.8* 14.4* 14.5*  HGB 11.4* 11.3* 11.0*  HCT 34.9* 34.4* 33.8*  PLT 226 240 276   Recent Labs  Lab 11/01/18 1618 11/02/18 1916 11/03/18 1816  NA 132* 130* 130*  K 3.9 4.0 4.3  CL 91* 88* 88*  CO2 26 30 27   BUN 17 20 22*  CREATININE 1.16* 1.06* 1.06*  CALCIUM 8.1* 8.0* 8.3*  GLUCOSE 138* 123* 120*    Imaging/Diagnostic Tests: Ct Angio Chest Pe W  Or Wo Contrast  Result Date: 10/31/2018 CLINICAL DATA:  PE suspected EXAM: CT ANGIOGRAPHY CHEST WITH CONTRAST TECHNIQUE: Multidetector CT imaging of the chest was performed using the standard protocol during bolus administration of intravenous contrast. Multiplanar CT image reconstructions and MIPs were obtained to evaluate the vascular anatomy. CONTRAST:  82mL OMNIPAQUE IOHEXOL 350 MG/ML SOLN COMPARISON:  Chest x-ray 10/29/2018.  Chest CT 08/10/2018 FINDINGS: Cardiovascular: Filling  defects are noted within bilateral lower lobe pulmonary arteries compatible with pulmonary emboli. No evidence of right heart strain. Heart is enlarged. Aorta is normal caliber. Mediastinum/Nodes: No mediastinal, hilar, or axillary adenopathy. Lungs/Pleura: Large right pleural effusion and small left pleural effusion. Area of masslike consolidation in the right lung base with locules of gas measures 4.7 x 4.5 cm. There are scattered ground-glass airspace opacities in the peripheral inferior right upper lobe and in the right lower lobe. This could be infectious or related to pulmonary infarcts. Peripheral ground-glass opacity also noted posteriorly in the left lower lobe which again could be infectious or related to pulmonary infarct. Upper Abdomen: Evaluation limited due to artifact related to the cardiac monitor sitting on the anterior abdomen of the patient. Musculoskeletal: Chest wall soft tissues are unremarkable. No acute bony abnormality. Review of the MIP images confirms the above findings. IMPRESSION: Small to medium-sized bilateral lower lobe pulmonary emboli. No evidence of right heart strain. Cardiomegaly. Large right pleural effusion and small left pleural effusion. Dense consolidation in the right lower lobe with masslike consolidation and locules of gas noted. This area could reflect pneumonia and/or pulmonary abscess. Scattered ground-glass opacities in the lungs could be infectious or related to pulmonary infarcts. These results will be called to the ordering clinician or representative by the Radiologist Assistant, and communication documented in the PACS or zVision Dashboard. Electronically Signed   By: Charlett Nose M.D.   On: 10/31/2018 22:58   [place] Ekg Site Rite  Result Date: 11/02/2018 If Site Rite image not attached, placement could not be confirmed due to current cardiac rhythm.   Allayne Stack, DO 11/04/2018, 7:49 AM PGY-1, [facility] FPTS Intern pager: [phone],  text pages welcome

## 2018-11-04 NOTE — Progress Notes (Signed)
Pt. Refuses to use BSC. Unable to obtain accurate output. Bed soiled.

## 2018-11-04 NOTE — Progress Notes (Signed)
Patient is refusing to take her Amantadine this morning. Stated she will try later. Will continue to reinforce.

## 2018-11-04 NOTE — Progress Notes (Signed)
MD aware of BP and HR. No change in care plane for now.  MD aware about patient refusing to be on cardiac teli. Pateint agreed to have second IV started for antibiotic. IV team order placed.

## 2018-11-04 NOTE — Plan of Care (Signed)
  Problem: Clinical Measurements: Goal: Respiratory complications will improve Outcome: Not Progressing  Pt. Continues to request fluids.
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
## 2018-11-04 NOTE — Progress Notes (Signed)
Patient during shift change asleep, with no cardiac monitoring. Pt refuses to have cardiac teli box on. MD notified and aware.

## 2018-11-04 NOTE — Progress Notes (Signed)
IV team at bedside patient is refusing an IV right now. Stated she wants it later she is asleep now, asked IV team to come between 12 and 1 pm. IV team agreeable and informed.

## 2018-11-05 LAB — BASIC METABOLIC PANEL
Anion gap: 11 (ref 5–15)
BUN: 25 mg/dL — ABNORMAL HIGH (ref 6–20)
CO2: 26 mmol/L (ref 22–32)
Calcium: 8.9 mg/dL (ref 8.9–10.3)
Chloride: 91 mmol/L — ABNORMAL LOW (ref 98–111)
Creatinine, Ser: 1.18 mg/dL — ABNORMAL HIGH (ref 0.44–1.00)
GFR calc Af Amer: >60 mL/min (ref >60)
GFR calc non Af Amer: 58 mL/min — ABNORMAL LOW (ref >60)
Glucose, Bld: 109 mg/dL — ABNORMAL HIGH (ref 70–99)
Potassium: 5.5 mmol/L — ABNORMAL HIGH (ref 3.5–5.1)
Sodium: 128 mmol/L — ABNORMAL LOW (ref 135–145)

## 2018-11-05 LAB — CBC
HCT: 33.8 % — ABNORMAL LOW (ref 36.0–46.0)
Hemoglobin: 11 g/dL — ABNORMAL LOW (ref 12.0–15.0)
MCH: 25.1 pg — ABNORMAL LOW (ref 26.0–34.0)
MCHC: 32.5 g/dL (ref 30.0–36.0)
MCV: 77.2 fL — ABNORMAL LOW (ref 80.0–100.0)
Platelets: 396 10*3/uL (ref 150–400)
RBC: 4.38 MIL/uL (ref 3.87–5.11)
RDW: 16.9 % — ABNORMAL HIGH (ref 11.5–15.5)
WBC: 14.9 10*3/uL — ABNORMAL HIGH (ref 4.0–10.5)
nRBC: 0 % (ref 0.0–0.2)

## 2018-11-05 LAB — VANCOMYCIN, PEAK: Vancomycin Pk: 30 ug/mL (ref 30–40)

## 2018-11-05 MED ORDER — MORPHINE SULFATE (PF) 2 MG/ML IV SOLN
1.0000 mg | INTRAVENOUS | Status: AC
  Administered 2018-11-05: 1 mg via INTRAVENOUS
  Filled 2018-11-05: qty 1

## 2018-11-05 MED ORDER — MELATONIN 3 MG PO TABS
3.0000 mg | ORAL_TABLET | Freq: Every evening | ORAL | Status: DC | PRN
  Administered 2018-11-05 – 2018-11-11 (×7): 3 mg via ORAL
  Filled 2018-11-05 (×9): qty 1

## 2018-11-05 MED ORDER — CARVEDILOL 3.125 MG PO TABS
3.1250 mg | ORAL_TABLET | Freq: Two times a day (BID) | ORAL | Status: DC
  Administered 2018-11-05 – 2018-11-08 (×5): 3.125 mg via ORAL
  Filled 2018-11-05 (×18): qty 1

## 2018-11-05 MED ORDER — LISINOPRIL 5 MG PO TABS
5.0000 mg | ORAL_TABLET | Freq: Every day | ORAL | Status: DC
  Administered 2018-11-05 – 2018-11-08 (×4): 5 mg via ORAL
  Filled 2018-11-05 (×4): qty 1

## 2018-11-05 NOTE — Progress Notes (Addendum)
Subjective:  Patient states that she is feeling slightly better.  Appetite is good.  Denies chest pain.  Still has cough.  No dyspnea.  Essentially bedbound And has not done any walking around.  Intake/Output from previous day:  I/O last 3 completed shifts: In: 2836.5 [P.O.:2142; I.V.:143.9; IV Piggyback:550.6] Out: 500 [Urine:500] Total I/O In: 120 [P.O.:120] Out: -   Blood pressure (!) 140/100, pulse (!) 110, temperature 98.6 F (37 C), resp. rate 18, height 5' 4"  (1.626 m), weight 59.1 kg, last menstrual period 09/13/2018, SpO2 100 %. Physical Exam  Constitutional: She appears well-developed and well-nourished.  Appears older than stated age  Eyes: Conjunctivae are normal.  Neck: Neck supple. JVD present.  Cardiovascular: Tachycardia present. Exam reveals gallop.  No murmur heard. No leg edema.  Pulmonary/Chest: Effort normal.  Bronchial breath sounds right upper lobe, decreased breath sounds at the bases, scattered crackles, soft.  Abdominal: Soft. Bowel sounds are normal.  Skin: Skin is warm and dry.   Lab Results: BMP BNP (last 3 results) Recent Labs    08/13/18 0358 10/05/18 0724 10/29/18 1235  BNP 1,153.1* 789.4* 2,729.9*    ProBNP (last 3 results) No results for input(s): PROBNP in the last 8760 hours. BMP Latest Ref Rng & Units 11/04/2018 11/03/2018 11/02/2018  Glucose 70 - 99 mg/dL 125(H) 120(H) 123(H)  BUN 6 - 20 mg/dL 21(H) 22(H) 20  Creatinine 0.44 - 1.00 mg/dL 1.03(H) 1.06(H) 1.06(H)  Sodium 135 - 145 mmol/L 127(L) 130(L) 130(L)  Potassium 3.5 - 5.1 mmol/L 4.6 4.3 4.0  Chloride 98 - 111 mmol/L 88(L) 88(L) 88(L)  CO2 22 - 32 mmol/L 26 27 30   Calcium 8.9 - 10.3 mg/dL 8.3(L) 8.3(L) 8.0(L)   Hepatic Function Latest Ref Rng & Units 10/05/2018 08/23/2018 08/10/2018  Total Protein 6.5 - 8.1 g/dL 6.8 8.5(H) 6.0(L)  Albumin 3.5 - 5.0 g/dL 2.8(L) 3.7 2.7(L)  AST 15 - 41 U/L 34 30 24  ALT 0 - 44 U/L 38 20 22  Alk Phosphatase 38 - 126 U/L 91 89 77  Total Bilirubin 0.3  - 1.2 mg/dL 0.8 1.2 0.8  Bilirubin, Direct 0.0 - 0.3 mg/dL - - -   CBC Latest Ref Rng & Units 11/04/2018 11/03/2018 11/02/2018  WBC 4.0 - 10.5 K/uL 13.4(H) 14.5(H) 14.4(H)  Hemoglobin 12.0 - 15.0 g/dL 11.2(L) 11.0(L) 11.3(L)  Hematocrit 36.0 - 46.0 % 34.9(L) 33.8(L) 34.4(L)  Platelets 150 - 400 K/uL 310 276 240   Lipid Panel     Component Value Date/Time   CHOL 121 12/21/2014 0700   TRIG 66 12/21/2014 0700   HDL 53 12/21/2014 0700   CHOLHDL 2.3 12/21/2014 0700   VLDL 13 12/21/2014 0700   LDLCALC 55 12/21/2014 0700  HEMOGLOBIN A1C Lab Results  Component Value Date   HGBA1C 5.8 (H) 08/09/2018   MPG 119.76 08/09/2018   TSH Recent Labs    08/09/18 1650 10/29/18 1607  TSH 0.492 0.646   Imaging: No results found.  Cardiac Studies: EKG 10/29/2018: Sinus tachycardia 138 bpm. Left atrial enlargement. Old anteroseptal infarct. Ventricular trigeminy. Nonspecific ST-T changes.  Telemetry: 7 beat NSVT 11/01/2018, continues to remain tachycardic.  Echocardiogram 08/10/2018: Mild LV dilatation. EF 20-25%. Global diffuse hypokinesis. Restrictive physiology. Mild RV dilatation. Moderate RV systolic dysfunction.  Severe left atrial dilatation. Mild right atrial dilatation. Severe tricuspid regurgitation. Severe pulmonary hypertension RVSP 66 mmHg. Trivial pericardial effusion  Scheduled Meds: . amantadine  100 mg Oral BID  . ARIPiprazole  10 mg Oral Daily  . feeding supplement (  ENSURE ENLIVE)  237 mL Oral TID BM  . multivitamin with minerals  1 tablet Oral Daily  . nicotine  7 mg Transdermal Daily  . Rivaroxaban  15 mg Oral BID WC  . traZODone  100 mg Oral QHS   Continuous Infusions: . levofloxacin (LEVAQUIN) IV 750 mg (11/04/18 1246)  . vancomycin Stopped (11/04/18 1757)   PRN Meds:.acetaminophen, benzonatate, guaiFENesin-dextromethorphan, haloperidol lactate, Melatonin  Assessment/Plan:  1.  Acute on chronic systolic and diastolic heart failure, involving the left  ventricle 2.  Acute pulmonary embolism, abnormal CTA 11/01/2018 revealing pulmonary embolism and cavitary pneumonia and large right pleural effusion 3.  Cavitating pneumonia, new findings of cavitary pneumonia compared to February CTA.  Agree with teaching service evaluation and plan, endocarditis is certainly possible but agree also that it will not change therapy and patient is not a candidate for TEE. 4. NSVT  Recommendation: Patient's refusal on taking medications is a big barrier for medical therapy and also patient continues to have substance use disorder.  Her lungs are much improved, leg edema is resolved, renal function is stable.  I will start her on metoprolol succinate 25 mg daily, also add lisinopril as her blood pressure is now much improved.  I will change CODE STATUS to DNR, limited code does not make any sense as her prognosis if she were to have a cardiac arrest is grim.  Please see my prior notes.  Addendum: Agree with Dr. Evie Lacks with Coreg in view of cocaine use. I was trying to make the regimen simple daily regimen in view of non compliance. Also low dose can be used ignoring unopposed alpha effects with cocaine, but fine with Coreg. I have again not started [facility] for the same reason. I have started Lisinpril for now. Thank you.  Adrian Prows, M.D. 11/05/2018, 10:05 AM [facility] Pager: [phone] Office: [phone] If no answer: [phone]

## 2018-11-05 NOTE — Progress Notes (Signed)
Patient reported coughing up blood clots. Nurse noted red blood in her sputum without blood clots. MD notified. They are aware, Xarelto will be continued, watch hemoglobin.

## 2018-11-05 NOTE — Progress Notes (Signed)
Patient keep refusing bed alarm, patient educated prior however she is refusing. Will continue to monitor.

## 2018-11-05 NOTE — Progress Notes (Signed)
Patient continues to refuse telemetry, MD notified.

## 2018-11-05 NOTE — Progress Notes (Addendum)
Family Medicine Teaching Service Daily Progress Note Intern Pager: [phone]  Patient name: Christine Cobb Medical record number: [id number] Date of birth: January 12, 1976 Age: 43 y.o. Gender: female  Primary Care Provider: Patient, No Pcp Per Consultants: Cardiology, Palliative, Psychiatry Code Status: PARTIAL: No CPR, No intubation, ACLS meds and defibrillation ONLY  Pt Overview and Major Events to Date:  5/3 Admitted to FPTS, CXR R>L pulmonary effusion, RUL opacity 5/5 CTA with BL pulmonary emboli, patient start on heparin gtt 5/6 Heparin gtt d/c'd, started Xarelto  Assessment and Plan: Christine Cobb a 42 y.o.femalepresenting with worseningperipheraledema and dyspnea on exertion. PMH is significant forcurrent every day smoker with history of COPD, HFrEF (EF 20-25%), HTN, Hyperthyroidism, bipolar mood disorder, schizoaffective disorder, and polysubstance abuse.   Acute on chronic HFrEF  Poor EF 10-15%: Stable. Echo 5/7 showing EF 10-15%.    Previously on IV milrinone per cardiology but this has been stopped due to patient noncompliance and loss of multiple PIVs.  BP has improved with 89/75 lowest overnight.  Patient appears euvolemic on exam.  Patient is not a candidate for advanced heart failure.  Patient with poor prognosis. -Palliative care consulted, patient made partial code after extensive conversation which is documented in palliative note. -Cardiology following, appreciate recommendations.  Cardiology recommending that patient become DNR given poor prognosis.  Also recommending restarting beta-blocker.  Previously had recommended Coreg, today recommending metoprolol.  Will likely restart Coreg given that patient does have history of cocaine use and disposition is unclear at this point.  Milrinone has been discontinued. -Continue to hold heart failure medications Lasix, digoxin, spironolactone.  Will restart Coreg and low-dose lisinopril per cardiology  recommendations if blood pressure is able to tolerate.  -Will not start entresto at this time, per cardiology -Strict I's and O's -Daily weights  RLL PNA +/-endocarditis  dyspnea: Acute, stable.  Patient remains afebrile overnight.  Does have continued productive cough.  Leukocytosis minimally improved at 13.4 from 14.4.  BC NG 3 days. COVID neg x2.  Clinical presentation likely secondary to pneumonia noted on CTA 5/5, continuing to consider endocarditis in the differential with bilateral PEs and history of drug use.  TTE negative for vegetations and BC no growth thus far.  Patient would require TEE for diagnosis of endocarditis however she is a poor candidate. -Continue IV vancomycin (5/7-), Continue IV Levaquin (5/8-) for pneumonia (cefepime not started due to anaphylaxis with amoxicillin).  Unclear of stop date at this time. -Follow-up on blood cultures -Monitor fever curve -Tessalon Perles as needed for cough -CBC with differential nightly -Patient was given morphine x1 for dyspnea by palliative on 5/9, unclear if this needs to be continued given patient's extensive polysubstance abuse history and unclear disposition plan.  Per palliative may consider standing as needed for morphine, however did not notice any improvement in patient and while up and walking around in room seemed to be breathing comfortably however when she started asking for medications, she seemed to increase her work of breathing.  Small-medium sized bilateral LL PE: Acute. Long-term anticoagulation challenging given her poor compliance and support.  Currently considered septic emboli until proven otherwise with history of drug use. TTE negative for vegetations, but with severe tricuspid regurg.   Patient will require TEE for diagnosis however she is poor candidate. -Continue Xarelto 15 mg BID (5/6-, day 4)  -Continue age-appropriate cancer screenings -IV vancomycin as above -Consider TEE in the future, clinical status not  permitting  Lack of medical decision-making capacity:  Deemed by  psychiatry to not have capacity for medical decision-making, however demonstrates insight during evaluation this morning.  Will continue to monitor.  At this time, we are her current medical decision makers, still in the process of reaching out to family with the help with social work.  -Palliative consulted who had extensive discussion with patient and she is now partial code.  Cardiology recommending that patient be DNR. -Social work on board, appreciate additional support -Plan of care consulted as below -Pursue IVC if patient attempts to leave AMA  Hyponatremia NA 127 on 5/9 which has been slowly downtrending.  Patient has not been compliant with IV fluids so we will continue to monitor but if sodium continues to drop will likely need to give IV fluids. -Daily evening BMP  Bipolar disorder  schizoaffective  MDD  anxiety: Chronic. Her mental state is a large barrier to her medical care, however much more interactive the past 2 days, likely as medications are taking effect.  Not take anything prior to admit, meds as below started by psychiatry. -Patient Received her IV Haldol monthly on 5/7 -Aripiprazole 10 mg daily (5/5-) -Amantadine 100 mg twice daily (5/5-) -Palliative placed as needed Haldol for agitation but with patient's prolonged QT may need to switch to IV Ativan if she continues to have agitation.  Of note patient is redirectable typically, will try to avoid sedating meds as able.  Homelessness No PCP:  - Social work on board, greatly appreciate help - Case management consulted, appreciate recommendations  Acute on CKD: Resolved. Cr 1.06, near baseline.  - Monitor BMP, nighttime labs -Avoid nephrotoxic medications as possible  HTN: Chronic, stable. Low-normotensive in the setting of above, SBP average 100s.  - Monitor BP - IV milrinone as above  COPD: Chronic, stable. No current  symptomatology. - Albuterol as needed  - likely sent home with Spiriva at discharge  Polysubstance Abuse: Chronic. Current daily smoker, history of cocaine and MJ use. UDS positive for cocaine on admit. - Nicotine patch 7 mg - Continue to encourage cessation, patient has voiced the want to discontinue  FEN/GI: heart healthy diet PPx: Full dose Xarelto  Disposition: Continued medical management, disposition help with social work  Subjective:  Patient walking around room without any close on this a.m.  Appears euvolemic and breathing comfortably on exam.  However when she sat down in bed patient started demanding more morphine and Haldol as she was given some overnight per palliative.  Patient states that she is having pain everywhere including in her chest and her abdomen.  Although she is nontender on abdominal exam.  Patient was noncompliant when asked to place her telemetry monitor back on given that she was having some increased work of breathing.  She stated that she needed the morphine and Haldol in order to get a few hours of sleep as that helped her sleep last night.  When explained that those medications were not to be used for sleep she stable to give me some trazodone.  I reassured patient that I will check her medication list to see what we could give her at which point she lay down in bed and close her eyes and pretended to be asleep.  Discussed patient with nursing staff who states that her cough seems to have gotten worse but patient refuses all interventions such as Jerilynn Som that are provided as needed.    Objective: Temp:  [97.3 F (36.3 C)-98.6 F (37 C)] 98.6 F (37 C) (05/10 0550) Pulse Rate:  [115-141]  126 (05/10 0550) Resp:  [18-20] 18 (05/10 0550) BP: (99-113)/(69-87) 99/80 (05/10 0550) SpO2:  [99 %-100 %] 99 % (05/10 0550) Physical Exam: General: NAD, middle-aged female, walking around room undressed Cardiovascular: Tachycardic, regular rhythm without  murmurs noted, no LE edema Respiratory: Tachypneic with clear breath sounds throughout and good air movement noted Gastrointestinal: soft, nontender, nondistended, normoactive BS MSK: moves 4 extremities equally, 2+ distal pulses Derm: no rashes appreciated Psych: Demonstrates insight into her current clinical status and medical conditions; has tangential thought; able to discuss consequences of not receiving treatments or monitoring such as telemetry   Laboratory: Recent Labs  Lab 11/02/18 1916 11/03/18 1816 11/04/18 1842  WBC 14.4* 14.5* 13.4*  HGB 11.3* 11.0* 11.2*  HCT 34.4* 33.8* 34.9*  PLT 240 276 310   Recent Labs  Lab 11/02/18 1916 11/03/18 1816 11/04/18 1842  NA 130* 130* 127*  K 4.0 4.3 4.6  CL 88* 88* 88*  CO2 30 27 26   BUN 20 22* 21*  CREATININE 1.06* 1.06* 1.03*  CALCIUM 8.0* 8.3* 8.3*  GLUCOSE 123* 120* 125*    Imaging/Diagnostic Tests: No imaging in the past 24 hours.  Kataryna Mcquilkin, [place], DO 11/05/2018, 8:09 AM PGY-2, [facility] FPTS Intern pager: [phone], text pages welcome

## 2018-11-05 NOTE — Progress Notes (Signed)
CSW called and lvm with patient's mother Christine Cobb on phone number is [phone]. CSW attempted to contact patient's legal husband Christine Cobb on [phone] number not in service.   Will continue to attempt to contact family in order to make medical decisions. If attempts continue to fail, CSW will follow up with director of potential guardianship request.   [name], LCSW [phone]

## 2018-11-05 NOTE — Progress Notes (Signed)
Patient is asking for pain medications and Morphine. No PRN medication and Morphine ordered. Talked with primary team. They are aware.

## 2018-11-05 NOTE — Progress Notes (Signed)
Patient is coughing, offered cough PRN medication patient refused

## 2018-11-06 LAB — CBC
HCT: 33.2 % — ABNORMAL LOW (ref 36.0–46.0)
Hemoglobin: 10.9 g/dL — ABNORMAL LOW (ref 12.0–15.0)
MCH: 24.9 pg — ABNORMAL LOW (ref 26.0–34.0)
MCHC: 32.8 g/dL (ref 30.0–36.0)
MCV: 75.8 fL — ABNORMAL LOW (ref 80.0–100.0)
Platelets: 460 10*3/uL — ABNORMAL HIGH (ref 150–400)
RBC: 4.38 MIL/uL (ref 3.87–5.11)
RDW: 16.6 % — ABNORMAL HIGH (ref 11.5–15.5)
WBC: 13 10*3/uL — ABNORMAL HIGH (ref 4.0–10.5)
nRBC: 0 % (ref 0.0–0.2)

## 2018-11-06 LAB — BASIC METABOLIC PANEL
Anion gap: 16 — ABNORMAL HIGH (ref 5–15)
BUN: 32 mg/dL — ABNORMAL HIGH (ref 6–20)
CO2: 24 mmol/L (ref 22–32)
Calcium: 8.5 mg/dL — ABNORMAL LOW (ref 8.9–10.3)
Chloride: 89 mmol/L — ABNORMAL LOW (ref 98–111)
Creatinine, Ser: 1.37 mg/dL — ABNORMAL HIGH (ref 0.44–1.00)
GFR calc Af Amer: 56 mL/min — ABNORMAL LOW (ref >60)
GFR calc non Af Amer: 48 mL/min — ABNORMAL LOW (ref >60)
Glucose, Bld: 94 mg/dL (ref 70–99)
Potassium: 5 mmol/L (ref 3.5–5.1)
Sodium: 129 mmol/L — ABNORMAL LOW (ref 135–145)

## 2018-11-06 LAB — VANCOMYCIN, TROUGH: Vancomycin Tr: 8 ug/mL — ABNORMAL LOW (ref 15–20)

## 2018-11-06 MED ORDER — LEVOFLOXACIN 750 MG PO TABS
750.0000 mg | ORAL_TABLET | Freq: Every day | ORAL | Status: AC
  Administered 2018-11-06 – 2018-11-09 (×4): 750 mg via ORAL
  Filled 2018-11-06 (×3): qty 1

## 2018-11-06 MED ORDER — VANCOMYCIN HCL 10 G IV SOLR
1250.0000 mg | INTRAVENOUS | Status: DC
  Administered 2018-11-07 – 2018-11-09 (×3): 1250 mg via INTRAVENOUS
  Filled 2018-11-06 (×4): qty 1250

## 2018-11-06 MED ORDER — HALOPERIDOL 5 MG PO TABS
5.0000 mg | ORAL_TABLET | Freq: Every day | ORAL | Status: DC
  Administered 2018-11-06 – 2018-11-07 (×2): 5 mg via ORAL
  Filled 2018-11-06 (×3): qty 1

## 2018-11-06 NOTE — Progress Notes (Signed)
Family Medicine Teaching Service Daily Progress Note Intern Pager: [phone]  Patient name: Christine Cobb Medical record number: [id number] Date of birth: July 02, 1975 Age: 43 y.o. Gender: female  Primary Care Provider: Patient, No Pcp Per Consultants: cards, palliative, psych Code Status: DNR  Pt Overview and Major Events to Date:    Assessment and Plan: Christine Cobb a 43 y.o.femalepresenting with worseningperipheraledema and dyspnea on exertion. PMH is significant forcurrent every day smoker with history of COPD, HFrEF (EF 20-25%), HTN, Hyperthyroidism, bipolar mood disorder, schizoaffective disorder, and polysubstance abuse.   Acute on chronic HFrEF  Poor EF 10-15%: Stable. Echo 5/7 showing EF 10-15%.  Previously on IV milrinone per cardiology but this has been stopped due to patient noncompliance and loss of multiple PIVs.  Patient is not a candidate for advanced heart failure. prognosis poor -Palliative care consulted, appreciate recs.  -Cardiology following, appreciate recommendations.   - coreg 3.125 BID -Lisinopril 5mg  --Continue to hold heart failure medications Lasix, digoxin, spironolactone.  -Will not start entresto at this time, per cardiology -Strict I's and O's -Daily weights  RLL PNA +/-endocarditis  dyspnea: Acute, stable. Afebrile. Leukocytosis stable ~ 13-14.  BC NG 3 days. COVID neg x2. TTEnegative for vegetations and BC no growth thus far.  Patient would require TEE for diagnosis of endocarditis however she is a poor candidate. Currently treating as prsumed endocarditis -Continue IV vancomycin(5/7-), Continue IV Levaquin(5/8-5/14)for pneumonia(cefepime not started due to anaphylaxis with amoxicillin).  -f/u blood cultures -Monitor fever curve -Tessalon Perles as needed for cough -CBC with differential nightly  Small-medium sized bilateral LL PE: Acute. Continues to be tachycardic.  Blood tinged sputum this am. Long-term  anticoagulation challenging given her poor compliance and support.Currently considered septic emboli until proven otherwise with history of drug use.TTE negative for vegetations, but with severe tricuspid regurg.  poor candidate for TEE.  -Continue Xarelto 15 mgBID(5/6-,day4)  -IV vancomycin as above -Consider TEE in the future,clinical status not permitting  Lack of medical decision-making capacity: Lacks capacity, per psych.  At this time, we are her current medical decision makers, still in the process of reaching out to family with the help with social work.  -Palliative consulted who had extensive discussion with patient and she is now partial code.  Cardiology recommending that patient be DNR. -Social work consulted, call for updates - reconsult psych regarding inpatient psych criteria.  -Plan of care consulted as below -Pursue IVC if patient attempts to leave AMA  Hyponatremia 130>127>128.   -Daily evening BMP  Bipolar disorder  schizoaffective  MDD  anxiety: Chronic. Not take anything prior to admit, meds as below started by psychiatry. -Patient Received her IV Haldol monthly on 5/7 -Aripiprazole 10 mg daily(5/5-) -Amantadine 100 mg twice daily(5/5-) - trazodone qhs -Haldol PRN for agitation.  Pt has prolonged QTc.   Homelessness No PCP:  - Social work on board, greatly appreciate help - Case management consulted, appreciate recommendations  Acute on CKD: Resolved. Cr 1.06,near baseline. - Monitor BMP, nighttime labs -Avoid nephrotoxic medications as possible  HTN: Chronic, stable. Currently hypo-low normotensive.  106/69 this am.  - lisinopril - Monitor BP  COPD: Chronic, stable. No current symptomatology. - Albuterol as needed  - likely sent home with Spiriva at discharge  Polysubstance Abuse: Chronic. Current daily smoker, history of cocaine and MJ use. UDS positive for cocaine on admit. - Nicotine patch 7 mg - Continue to  encourage cessation, patient has voiced the want to discontinue  FEN/GI: heart healthy diet PPx: Full  dose Xarelto  FEN/GI: heart healthy/ PPx: xarelto  Disposition: undetermined  Subjective:  Pt stated she has been coughing and having chest pain when she coughs.  She states she is also having SOB when she ambulates.  She vomited up her ensure last night.    Objective: Temp:  [98.3 F (36.8 C)-98.6 F (37 C)] 98.3 F (36.8 C) (05/10 2205) Pulse Rate:  [110-126] 116 (05/10 2205) Resp:  [18-20] 20 (05/10 2043) BP: (92-141)/(65-100) 92/65 (05/10 2205) SpO2:  [96 %-100 %] 96 % (05/10 2205) Physical Exam: General: alert.  No acute distress.  Sitting up in bed.   Cardiovascular: regular rhythm. Normal rate. No murmurs.   Respiratory: decreased lung sounds on inspiration.  Cough productive of blood tinged sputum.  Inspiratory crackles heard in the lower lungs bilaterally.  Abdomen: soft, mildly tender diffusely.  Normal bowel sounds.  Extremities: trace pedal edema.    Laboratory: Recent Labs  Lab 11/03/18 1816 11/04/18 1842 11/05/18 2038  WBC 14.5* 13.4* 14.9*  HGB 11.0* 11.2* 11.0*  HCT 33.8* 34.9* 33.8*  PLT 276 310 396   Recent Labs  Lab 11/03/18 1816 11/04/18 1842 11/05/18 2038  NA 130* 127* 128*  K 4.3 4.6 5.5*  CL 88* 88* 91*  CO2 27 26 26   BUN 22* 21* 25*  CREATININE 1.06* 1.03* 1.18*  CALCIUM 8.3* 8.3* 8.9  GLUCOSE 120* 125* 109*     Imaging/Diagnostic Tests:   Sandre Kitty, MD 11/06/2018, 5:28 AM PGY-1, [facility] FPTS Intern pager: [phone], text pages welcome

## 2018-11-06 NOTE — Consult Note (Addendum)
[facility] Psych Consult Progress Note  11/06/2018 1:57 PM Christine Cobb  MRN:  [id number] Subjective:  Christine Cobb was last seen by the psychiatry consult service for capacity evaluation to refuse treatment. She was determined to lack capacity at this time. Psychiatry is consulted today for evaluation for inpatient psychiatric hospitalization.   On interview, Christine Cobb is sleeping and becomes irritable when attempting to wake her up to participate in interview. She reports, "I don't need help. I got Jesus. Please just leave me alone ma'am. Damn." She asked this notewriter to return at another time although the importance of speaking to her was explained.   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  Past Medical History:  Past Medical History:  Diagnosis Date  . Arm pain   . Bipolar affective disorder, currently manic, mild (HCC)   . CHF (congestive heart failure) (HCC)   . Eye globe prosthesis   . HTN (hypertension)   . Hyperthyroidism   . Sinus tachycardia     Past Surgical History:  Procedure Laterality Date  . DILATION AND CURETTAGE OF UTERUS    . EYE SURGERY     Family History:  Family History  Problem Relation Age of Onset  . Hypertension Other   . Emphysema Other   . Asthma Son   . Diabetes Maternal Uncle   . Diabetes Paternal Grandmother    Family Psychiatric  History: Denies  Social History:  Social History   Substance and Sexual Activity  Alcohol Use Yes   Comment: Last  drink: 1/2 beer PTA     Social History   Substance and Sexual Activity  Drug Use Yes  . Types: Marijuana   Comment: Pt sts "anything and everything"    Social History   Socioeconomic History  . Marital status: Married    Spouse name: Not on file  . Number of children: Not on file  . Years of education: Not on file  . Highest education level: Not on file  Occupational History  . Not on file  Social Needs  . Financial resource strain: Not on file  . Food insecurity:    Worry: Not on file    Inability: Not on file  . Transportation needs:    Medical: Not on file    Non-medical: Not on file  Tobacco Use  . Smoking status: Current Every Day Smoker    Types: Cigarettes    Last attempt to quit: 08/21/2013    Years since quitting: 5.2  . Smokeless tobacco: Never Used  Substance and Sexual Activity  . Alcohol use: Yes    Comment: Last drink: 1/2 beer PTA  . Drug use: Yes    Types: Marijuana    Comment: Pt sts "anything and everything"  . Sexual activity: Yes    Birth control/protection: Injection    Comment: crack cocaine  Lifestyle  . Physical activity:    Days per week: Not on file    Minutes per session: Not on file  . Stress: Not on file  Relationships  . Social connections:  Talks on phone: Not on file    Gets together: Not on file    Attends religious service: Not on file    Active member of club or organization: Not on file    Attends meetings of clubs or organizations: Not on file    Relationship status: Not on file  Other Topics Concern  . Not on file  Social History Narrative  . Not on file    Sleep: Good  Appetite:  Good  Current Medications: Current Facility-Administered Medications  Medication Dose Route Frequency Provider Last Rate Last Dose  . acetaminophen (TYLENOL) tablet 650 mg  650 mg Oral Q6H PRN Lennox Solders, MD      . amantadine (SYMMETREL) capsule 100 mg  100 mg Oral BID Lennox Solders, MD   100 mg at 11/06/18 1111  .  ARIPiprazole (ABILIFY) tablet 10 mg  10 mg Oral Daily Lennox Solders, MD   10 mg at 11/05/18 2050  . benzonatate (TESSALON) capsule 200 mg  200 mg Oral TID PRN Lennox Solders, MD   200 mg at 11/03/18 2242  . carvedilol (COREG) tablet 3.125 mg  3.125 mg Oral BID WC Talbert Forest, [place], DO   3.125 mg at 11/06/18 0656  . feeding supplement (ENSURE ENLIVE) (ENSURE ENLIVE) liquid 237 mL  237 mL Oral TID BM Lennox Solders, MD   237 mL at 11/06/18 1118  . guaiFENesin-dextromethorphan (ROBITUSSIN DM) 100-10 MG/5ML syrup 5 mL  5 mL Oral Q4H PRN Westley Chandler, MD   5 mL at 11/04/18 1922  . haloperidol lactate (HALDOL) injection 1 mg  1 mg Intravenous Q6H PRN Melene Plan, MD   1 mg at 11/05/18 2159  . levofloxacin (LEVAQUIN) tablet 750 mg  750 mg Oral Daily Sandre Kitty, MD   750 mg at 11/06/18 1133  . lisinopril (ZESTRIL) tablet 5 mg  5 mg Oral Daily Yates Decamp, MD   5 mg at 11/06/18 1111  . Melatonin TABS 3 mg  3 mg Oral QHS PRN Shirley, [place], DO   3 mg at 11/05/18 2159  . multivitamin with minerals tablet 1 tablet  1 tablet Oral Daily Lennox Solders, MD   1 tablet at 11/06/18 1112  . nicotine (NICODERM CQ - dosed in mg/24 hr) patch 7 mg  7 mg Transdermal Daily Lennox Solders, MD   7 mg at 11/06/18 1109  . Rivaroxaban (XARELTO) tablet 15 mg  15 mg Oral BID WC Lennox Solders, MD   15 mg at 11/06/18 0656  . traZODone (DESYREL) tablet 100 mg  100 mg Oral QHS Anderson Malta L, DO   100 mg at 11/05/18 2158  . vancomycin (VANCOCIN) IVPB 1000 mg/200 mL premix  1,000 mg Intravenous Q24H Lennox Solders, MD   Stopped at 11/05/18 1847    Lab Results:  Results for orders placed or performed during the hospital encounter of 10/29/18 (from the past 48 hour(s))  CBC     Status: Abnormal   Collection Time: 11/04/18  6:42 PM  Result Value Ref Range   WBC 13.4 (H) 4.0 - 10.5 K/uL   RBC 4.51 3.87 - 5.11 MIL/uL   Hemoglobin 11.2 (L) 12.0 - 15.0 g/dL   HCT 16.1 (L) 09.6 - 04.5 %   MCV 77.4  (L) 80.0 - 100.0 fL   MCH 24.8 (L) 26.0 - 34.0 pg   MCHC 32.1 30.0 - 36.0 g/dL   RDW 40.9 (H) 81.1 - 91.4 %  Platelets 310 150 - 400 K/uL   nRBC 0.0 0.0 - 0.2 %    Comment: Performed at [facility], [address], [place], [place] [address]  Basic metabolic panel     Status: Abnormal   Collection Time: 11/04/18  6:42 PM  Result Value Ref Range   Sodium 127 (L) 135 - 145 mmol/L   Potassium 4.6 3.5 - 5.1 mmol/L   Chloride 88 (L) 98 - 111 mmol/L   CO2 26 22 - 32 mmol/L   Glucose, Bld 125 (H) 70 - 99 mg/dL   BUN 21 (H) 6 - 20 mg/dL   Creatinine, Ser 5.78 (H) 0.44 - 1.00 mg/dL   Calcium 8.3 (L) 8.9 - 10.3 mg/dL   GFR calc non Af Amer >60 >60 mL/min   GFR calc Af Amer >60 >60 mL/min   Anion gap 13 5 - 15    Comment: Performed at [facility], [address], [place], [place] [address]  CBC     Status: Abnormal   Collection Time: 11/05/18  8:38 PM  Result Value Ref Range   WBC 14.9 (H) 4.0 - 10.5 K/uL   RBC 4.38 3.87 - 5.11 MIL/uL   Hemoglobin 11.0 (L) 12.0 - 15.0 g/dL   HCT 95.2 (L) 84.1 - 32.4 %   MCV 77.2 (L) 80.0 - 100.0 fL   MCH 25.1 (L) 26.0 - 34.0 pg   MCHC 32.5 30.0 - 36.0 g/dL   RDW 40.1 (H) 02.7 - 25.3 %   Platelets 396 150 - 400 K/uL   nRBC 0.0 0.0 - 0.2 %    Comment: Performed at [facility], [address] [place], [place] [address]  Basic metabolic panel     Status: Abnormal   Collection Time: 11/05/18  8:38 PM  Result Value Ref Range   Sodium 128 (L) 135 - 145 mmol/L   Potassium 5.5 (H) 3.5 - 5.1 mmol/L   Chloride 91 (L) 98 - 111 mmol/L   CO2 26 22 - 32 mmol/L   Glucose, Bld 109 (H) 70 - 99 mg/dL   BUN 25 (H) 6 - 20 mg/dL   Creatinine, Ser 3.47 (H) 0.44 - 1.00 mg/dL   Calcium 8.9 8.9 - 42.5 mg/dL   GFR calc non Af Amer 58 (L) >60 mL/min   GFR calc Af Amer >60 >60 mL/min   Anion gap 11 5 - 15    Comment: Performed at [facility], [address] [place], [place] [address]  Vancomycin, peak     Status: None   Collection Time:  11/05/18  8:38 PM  Result Value Ref Range   Vancomycin Pk 30 30 - 40 ug/mL    Comment: Performed at [facility], [address] [place], [place] [address]    Blood Alcohol level:  Lab Results  Component Value Date   [facility] <10 10/05/2018   ETH <10 08/23/2018    Musculoskeletal: Strength & Muscle Tone: within normal limits Gait & Station: UTA since patient is lying in bed. Patient leans: N/A  Psychiatric Specialty Exam: Physical Exam  Nursing note and vitals reviewed. Constitutional: She is oriented to person, place, and time. She appears well-developed and well-nourished.  HENT:  Head: Normocephalic and atraumatic.  Neck: Normal range of motion.  Respiratory: Effort normal.  Musculoskeletal: Normal range of motion.  Neurological: She is alert and oriented to person, place, and time.  Psychiatric: Her speech is normal. Thought content normal.  Her affect is labile. She is agitated. Cognition and memory are normal. She expresses impulsivity.    Review of Systems  Psychiatric/Behavioral: The patient does not have insomnia.   All other systems reviewed and are negative.   Blood pressure 106/69, pulse (!) 101, temperature 98 F (36.7 C), temperature source Oral, resp. rate 20, height 5\' 4"  (1.626 m), weight 59.1 kg, last menstrual period 09/13/2018, SpO2 99 %.Body mass index is 22.38 kg/m.  General Appearance: Fairly Groomed, middle aged, African American female wearing a hospital gown with hair in braids who is lying in bed. NAD.   Eye Contact:  Fair  Speech:  Clear and Coherent and Normal Rate  Volume:  Increased  Mood:  Irritable  Affect:  Labile  Thought Process:  Goal Directed, Linear and Descriptions of Associations: Intact  Orientation:  Full (Time, Place, and Person)  Thought Content:  Logical  Suicidal Thoughts:  No  Homicidal Thoughts:  No  Memory:  Immediate;   Good Recent;   Good Remote;   Good  Judgement:  Poor  Insight:  Fair  Psychomotor Activity:   Increased  Concentration:  Concentration: Fair and Attention Span: Fair  Recall:  [employer] of Knowledge:  Fair  Language:  Fair  Akathisia:  No  Handed:  Right  AIMS (if indicated):   N/A  Assets:  Resilience  ADL's:  Intact  Cognition:  WNL  Sleep:   Okay   Assessment:  Christine Cobb is a 43 y.o. female who was admitted with CHF exacerbation due to poor medication compliance. Patient is irritable and unwilling to participate in interview today. She is otherwise organized in thought process and does not appear to be responding to internal stimuli. Recommend discontinuing Abilify in lieu of Haldol for agitation/mood stabilization. Patient's presentation is likely attributed to substance use as well. She may benefit from Gabapentin for cocaine use.    Treatment Plan Summary: -Discontinue Abilify and start Haldol 5 mg qhs for mood stabilization. -Consider Gabapentin 100 mg TID for cocaine use/agitation. Can increase by 100 mg increments daily up to 300 mg TID.  -Continue Amantadine 100 mg BID for EPS prophylaxis.  -Continue monthly Haldol decanoate 100 mg injection (last given on 5/7).  -Continue Melatonin 3 mg qhs PRN for insomnia. -EKG reviewed and QTc477 on 5/5. Please closely monitor when starting or increasing QTc prolonging agents. -Patient should continue follow up with her outpatient provider for further medication management.  -Psychiatry will sign off on patient at this time. Please consult psychiatry again as needed.    Cherly Beach, DO 11/06/2018, 1:57 PM

## 2018-11-06 NOTE — Plan of Care (Signed)
  Problem: Cardiac: Goal: Ability to achieve and maintain adequate cardiopulmonary perfusion will improve Outcome: Progressing   Problem: Clinical Measurements: Goal: Respiratory complications will improve Outcome: Progressing   Problem: Clinical Measurements: Goal: Cardiovascular complication will be avoided Outcome: Progressing   Problem: Clinical Measurements: Goal: Cardiovascular complication will be avoided Outcome: Progressing   Problem: Cardiac: Goal: Ability to achieve and maintain adequate cardiopulmonary perfusion will improve Outcome: Progressing

## 2018-11-06 NOTE — Progress Notes (Signed)
CALL PAGER [phone] for any questions or notifications regarding this patient  FMTS Attending Note: Denny Levy MD I have reviewed her chart in detail. 1. Psychiatric issues including lack of competence, schizoaffective disorder bipolar type, cocaine induced mood disorder.: Patient has no family and no HCPOA. She has been seen previously by psychiatry and found to lack capacity to make her own decisions. She has also been seen by the Palliative Care service and they were extremely helpful in determining how to make clinical decision for this patient, given the above situation. We are asking social work to consider guardian ad litem.  2. CV issues includingar question of endocarditis / valvular vegetations.  She has been pretty adamant that she does not want TEE and I would not feel it appropriate to force her to endure that procedure; however, without that we cannot exclude valvular vegetations, so I think we are left with empirically treating her for endocarditis which is a longer course of treatment. HFrEF with EF 10-15% with outpatient noncompliance and some intermittent inpatient noncompliance with meds. Appreciate cardiology consult. Notably, cardiology changed her to DNR status today. 3. Disposition is extremely complex. We asked psychiatry to re-evaluate today to see if she would be eligible for psychiatric inpatient and they determined she was not. We are concerned SNF will not accept her given her multiple psychiatric diagnoses and issues as well as her history of substance abuse. She has no family  That we can find who could assist her. She is not competent to make decisions for herself so I do not see how she could function on her own.

## 2018-11-06 NOTE — Progress Notes (Signed)
Pharmacy Antibiotic Note:  Vancomycin Initiation  Christine Cobb is a 43 y.o. female admitted on 10/29/2018 with CHF exacerbation (acute on chronic systolic and diastolic, biventricular heart failure; LVEF today is 10-15%) and acute PE/cavitating lesion. Pt now with increasing temperature and elevated WBC. Given hx of substance abuse, suspecting large septic emboli.  Pharmacy has been consulted for vancomycin dosing. Goal vancomycin AUC = 400-550.  Patient now being treated for cavitary pneumonia, septic emboli, possible endocarditis.   Vancomycin Peak of 30 Vancomycin Trough of 8 Calculated AUC of 437 at low end of goal Ke 0.0634 t 1/2 10.9h  Plan: Increase vancomycin to 1250 mg q24 hours with next dose (5/11 dose already given) Expected new AUC of 546  Height: 5\' 4"  (162.6 cm) Weight: 130 lb 6.4 oz (59.1 kg) IBW/kg (Calculated) : 54.7  Temp (24hrs), Avg:98.2 F (36.8 C), Min:98 F (36.7 C), Max:98.3 F (36.8 C)  Recent Labs  Lab 11/02/18 1528 11/02/18 1916 11/03/18 1816 11/04/18 1842 11/05/18 2038 11/06/18 1640  WBC  --  14.4* 14.5* 13.4* 14.9* 13.0*  CREATININE  --  1.06* 1.06* 1.03* 1.18* 1.37*  LATICACIDVEN 2.1* 1.6  --   --   --   --   VANCOTROUGH  --   --   --   --   --  8*  VANCOPEAK  --   --   --   --  30  --     Estimated Creatinine Clearance: 47.1 mL/min (A) (by C-G formula based on SCr of 1.37 mg/dL (H)).    Allergies  Allergen Reactions  . Amoxil [Amoxicillin] Anaphylaxis    Did it involve swelling of the face/tongue/throat, SOB, or low BP? yes Did it involve sudden or severe rash/hives, skin peeling, or any reaction on the inside of your mouth or nose? yes Did you need to seek medical attention at a hospital or doctor's office? unknown When did it last happen?unknoen If all above answers are "NO", may proceed with cephalosporin use.   Marland Kitchen Hydroxyzine     Other reaction(s): Bleeding  . Ketorolac Tromethamine Hives and Swelling  . Prenatal  [B-Plex Plus] Nausea Only  . Tegretol [Carbamazepine] Other (See Comments)    Swelling, skin peeling, skin discoloration  . B-Plex Plus Nausea Only    Prenatal   . Tegretol [Carbamazepine] Swelling and Other (See Comments)    Skin peeling Skin discoloring     Antimicrobials this admission: Vanc 5/7 >> Levaquin 5/8 >> (5/14)  Dose adjustments this admission: 5/11 Vancomycin increased to 1250mg  q24 hours  Microbiology results: 5/7 Blood NGTD  5/3 COVID neg 5/8 COVID Neg  Thank you for allowing pharmacy to be a part of this patient's care.  Sheppard Coil PharmD., BCPS Clinical Pharmacist 11/06/2018 6:01 PM

## 2018-11-07 DIAGNOSIS — F1494 Cocaine use, unspecified with cocaine-induced mood disorder: Secondary | ICD-10-CM

## 2018-11-07 LAB — CULTURE, BLOOD (ROUTINE X 2)
Culture: NO GROWTH
Culture: NO GROWTH
Special Requests: ADEQUATE
Special Requests: ADEQUATE

## 2018-11-07 MED ORDER — POLYETHYLENE GLYCOL 3350 17 G PO PACK
17.0000 g | PACK | Freq: Two times a day (BID) | ORAL | Status: DC
  Administered 2018-11-07 – 2018-11-15 (×16): 17 g via ORAL
  Filled 2018-11-07 (×18): qty 1

## 2018-11-07 MED ORDER — CALCIUM CARBONATE ANTACID 500 MG PO CHEW
1.0000 | CHEWABLE_TABLET | Freq: Two times a day (BID) | ORAL | Status: DC | PRN
  Administered 2018-11-07 – 2018-11-12 (×3): 200 mg via ORAL
  Filled 2018-11-07 (×3): qty 1

## 2018-11-07 MED ORDER — ONDANSETRON HCL 4 MG PO TABS
4.0000 mg | ORAL_TABLET | Freq: Once | ORAL | Status: AC
  Administered 2018-11-07: 4 mg via ORAL
  Filled 2018-11-07: qty 1

## 2018-11-07 MED ORDER — FUROSEMIDE 10 MG/ML IJ SOLN
40.0000 mg | Freq: Once | INTRAMUSCULAR | Status: AC
  Administered 2018-11-07: 40 mg via INTRAVENOUS
  Filled 2018-11-07: qty 4

## 2018-11-07 NOTE — Progress Notes (Signed)
Subjective:  Shortness of breath improved. Patient reports abdominal pain and requests pregnancy test.  Pregnancy test was normal on 10/31/2018  Objective:  Vital Signs in the last 24 hours: Temp:  [97 F (36.1 C)-98.1 F (36.7 C)] 98.1 F (36.7 C) (05/12 1134) Pulse Rate:  [96-106] 101 (05/12 1134) Resp:  [16-20] 20 (05/12 1134) BP: (87-123)/(61-110) 87/61 (05/12 1134) SpO2:  [34 %-100 %] 96 % (05/12 1134) Weight:  [59.9 kg] 59.9 kg (05/12 0522)  Intake/Output from previous day: 05/11 0701 - 05/12 0700 In: 534.1 [P.O.:480; IV Piggyback:54.1] Out: -   Physical Exam  Constitutional: She appears well-developed and well-nourished. No distress.  HENT:  Head: Normocephalic and atraumatic.  Neck: No appreciaable JVD, although exam is limited. .  Cardiovascular: Regular rhythm and intact distal pulses. Normal heart rate. Holosystolic murmur RLSB No murmur heard. Pulmonary/Chest: Effort normal and breath sounds normal. No respiratory distress. She has no wheezes. She has no rales.  Abdominal: Soft. Bowel sounds are normal. There is no rebound.  Musculoskeletal:        General: No edema present.  Lymphadenopathy:    She has no cervical adenopathy.  Neurological: She is alert. No gross neurodeficit Skin: Skin is warm and dry.  Psychiatric: Flat affect   Nursing note and vitals reviewed.   Lab Results: BMP Recent Labs    11/04/18 1842 11/05/18 2038 11/06/18 1640  NA 127* 128* 129*  K 4.6 5.5* 5.0  CL 88* 91* 89*  CO2 26 26 24   GLUCOSE 125* 109* 94  BUN 21* 25* 32*  CREATININE 1.03* 1.18* 1.37*  CALCIUM 8.3* 8.9 8.5*  GFRNONAA >60 58* 48*  GFRAA >60 >60 56*    CBC Recent Labs  Lab 11/02/18 1916  11/06/18 1640  WBC 14.4*   < > 13.0*  RBC 4.49   < > 4.38  HGB 11.3*   < > 10.9*  HCT 34.4*   < > 33.2*  PLT 240   < > 460*  MCV 76.6*   < > 75.8*  MCH 25.2*   < > 24.9*  MCHC 32.8   < > 32.8  RDW 17.2*   < > 16.6*  LYMPHSABS 1.3  --   --   MONOABS 1.7*  --    --   EOSABS 0.0  --   --   BASOSABS 0.0  --   --    < > = values in this interval not displayed.    HEMOGLOBIN A1C Lab Results  Component Value Date   HGBA1C 5.8 (H) 08/09/2018   MPG 119.76 08/09/2018    Cardiac Panel (last 3 results) Recent Labs    10/29/18 1607 10/30/18 0528 10/30/18 1645  TROPONINI 0.32* 0.62* 0.65*    BNP (last 3 results) Recent Labs    08/13/18 0358 10/05/18 0724 10/29/18 1235  BNP 1,153.1* 789.4* 2,729.9*    TSH Recent Labs    08/09/18 1650 10/29/18 1607  TSH 0.492 0.646    Lipid Panel     Component Value Date/Time   CHOL 121 12/21/2014 0700   TRIG 66 12/21/2014 0700   HDL 53 12/21/2014 0700   CHOLHDL 2.3 12/21/2014 0700   VLDL 13 12/21/2014 0700   LDLCALC 55 12/21/2014 0700     Hepatic Function Panel Recent Labs    08/10/18 0303 08/23/18 0001 10/05/18 0724  PROT 6.0* 8.5* 6.8  ALBUMIN 2.7* 3.7 2.8*  AST 24 30 34  ALT 22 20 38  ALKPHOS 77 89 91  BILITOT  0.8 1.2 0.8    CARDIAC STUDIES:  EKG 10/29/2018: Sinus tachycardia 138 bpm.  Left atrial enlargement.  Old anteroseptal infarct.  Ventricular trigeminy.  Nonspecific ST-T changes.  Echocardiogram 08/10/2018: Mild LV dilatation.  EF 20-25%.  Global diffuse hypokinesis.  Restrictive physiology. Mild RV dilatation.  Moderate RV systolic dysfunction.  Severe left atrial dilatation.  Mild right atrial dilatation. Severe tricuspid regurgitation. Severe pulmonary hypertension RVSP 66 mmHg. Trivial pericardial effusion   Assessment & Recommendations:  43 y.o. African-American female  with polysubstance abuse, hypertension, history of hyperthyroidism, bipolar disorder, schizophrenia, known systolic biventricular heart failure, severe pulmonary hypertension, admitted with acute on chronic systolic heart failure.  Acute on chronic systolic and diastolic, biventricular heart failure: EF 10-15%.  Appears euvolumic. Clinically, not in shock.  Continue lisinopril  Mg,  carvedilol 3.125 mg bid. Unable to add Entresto due to K and Cr Recommend daily weights and strict intake/output. 2 g sodium diet.  Troponin elevation: Likely type II MI due to heart failure exacerbation. ACS unlikely.  Tachycardia: Resolved  Acute pulmonary emboli/cavitating lesion. No hemodynamic compromise. Anticoagulation as per the primary team No obvious vegetation seen on transthoracic echocardiogram.   CKD: Increase in Cr, back to baseline. Some increase in Cr is expected after aggressive diuresis and use of ACEi. Continue to monitor.  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  Continue management as above.  Bipolar disorder, schizophrenia, polysubstance abuse, homelessness: These remain significant barriers for her care and likely precipitating her medication and diet noncompliance.  She will need assistance from Technical sales engineer. She remains at high risk for readmission.   Elder Negus, M.D. 11/07/2018, 2:59 PM [facility] Pager: [phone] Office: [phone] If no answer: [phone]

## 2018-11-07 NOTE — Progress Notes (Addendum)
Patient refused to take Coreg- due to BP being low.  Told Patient I was going to recheck BP- but she refused that as well.  Explained the importance of medication- need to take to help control HR.  Pt still refused.    Patient also removed IV- stated it was hurting.  Told her that we would need to get another IV if she removes.  She refused another IV at this time.  Explained we need IV access for IV antibiotics and Haldol PRN   1935 Patient states she believe Coreg is causing her SOB and that she does not have HTN.    Paged MD

## 2018-11-07 NOTE — Progress Notes (Signed)
Nutrition Follow-up  RD working remotely.  DOCUMENTATION CODES:   Not applicable  INTERVENTION:   -Continue Ensure Enlive po TID, each supplement provides 350 kcal and 20 grams of protein -Continue MVI with minerals daily  NUTRITION DIAGNOSIS:   Predicted suboptimal nutrient intake related to social / environmental circumstances as evidenced by per patient/family report.  Ongoing  GOAL:   Patient will meet greater than or equal to 90% of their needs  Progressing  MONITOR:   PO intake, Supplement acceptance, Labs, Weight trends, Skin, I & O's  REASON FOR ASSESSMENT:   Malnutrition Screening Tool    ASSESSMENT:   Christine Cobb is a 43 y.o. female presenting with worsening peripheral edema and dyspnea on exertion. PMH is significant for current every day smoker with history of COPD, HFrEF (EF 20-25%), HTN, Hyperthyroidism, bipolar mood disorder, schizoaffective disorder, and polysubstance abuse.   Reviewed I/O's: +534 ml x 24 hours and -78 ml since admission  Pt continues to refuse aspect of care and often provides little information to providers. Per psychiatry note on 11/01/18, pt does not demonstrate capacity of to refuse current treatment. Palliative care also following and pt is now a limited code and considering appointing a legal guardian for decision making. CSW has made efforts to contact family members.   Urine output not recorded. Per RN notes, I/O's not accurate due to pt refusing to use BSC. Noted pt has experienced a 9.8% wt loss in the past week, likely due to diuresis (per cardiology notes, edema is improving).   Pt with poor prognosis with limited options secondary to complicated social situation. Pt is refusing PICC line and has removed multiple IVs.   Pt remains with good oral intake; noted meal completion 80-100%. Pt has been noncompliant with fluid restrictions. Ensure supplements were ordered last week per pt request, which she is consuming.    Labs reviewed: Na: 128, K: 5.5.   Diet Order:   Diet Order            Diet Heart Room service appropriate? Yes; Fluid consistency: Thin; Fluid restriction: 1500 mL Fluid  Diet effective now              EDUCATION NEEDS:   No education needs have been identified at this time  Skin:  Skin Assessment: Reviewed RN Assessment  Last BM:  11/06/18  Height:   Ht Readings from Last 1 Encounters:  10/29/18 5\' 4"  (1.626 m)    Weight:   Wt Readings from Last 1 Encounters:  11/07/18 59.9 kg    Ideal Body Weight:  54.5 kg  BMI:  Body mass index is 22.66 kg/m.  Estimated Nutritional Needs:   Kcal:  1700-1900  Protein:  90-105 grams  Fluid:  1.7-1.9 L    Christine Cobb A. Mayford Knife, RD, LDN, CDCES Registered Dietitian II Certified Diabetes Care and Education Specialist Pager: [phone] After hours Pager: [phone]

## 2018-11-07 NOTE — Progress Notes (Signed)
MD on call notified that patient complains of nausea and constipation. Ilean Skill LPN

## 2018-11-07 NOTE — Progress Notes (Signed)
FPTS Interim Progress Note  Received a page stating patient removed IV and was refusing her Coreg.  Went to see patient.  Patient was in bed having new IV placed at time of encounter.  Patient was pleasant and happy to see me again. She had flight of ideas and pressured speech throughout the entire visit.  Initially she refused the Coreg stating it made her out of breath.  At one point, she even stated she did not want to take the Xarelto either. Continued to educate her on the importance of her medicine, particularly the Xarelto and Coreg.  She still does not have a great understanding of her medical disease process.  By the end of the visit she agreed to continue her medications at this time.  Of note, she changed her mind several times throughout the entire visit so I am not sure how long she will continue to agree to take her medicines.  [name] Cambria, DO 11/07/2018, 8:45 PM PGY-1, [facility] Medicine Service pager [phone]

## 2018-11-07 NOTE — Care Management Important Message (Signed)
Important Message  Patient Details  Name: Christine Cobb MRN: [id number] Date of Birth: Jun 02, 1976   Medicare Important Message Given:  Yes    Cathern Tahir Stefan Church 11/07/2018, 2:44 PM

## 2018-11-07 NOTE — Progress Notes (Signed)
Paged MD-  Patient states having some pain in abdomen and chest/back area due to cough.   MD wanted to try robitussin 1st.  Per MD want to avoid morphine.   Patient also requested Melatonin, trazodone and ambilify.   Per MD- will keep only once a day melatonin and trazodone and avoid ambilify.

## 2018-11-07 NOTE — Progress Notes (Signed)
Patient refused coreg again.

## 2018-11-07 NOTE — Progress Notes (Signed)
Pt in restroom and requests return later for PIV access.

## 2018-11-07 NOTE — Progress Notes (Signed)
Spoke with MD on call informed that patient is coughing but refuses the Tessalon pearls and the guaifenesin also patient c/o shortness of breath but stated that she didn't need oxygen when offered. No chest pain. Ilean Skill LPN

## 2018-11-07 NOTE — Progress Notes (Addendum)
Family Medicine Teaching Service Daily Progress Note Intern Pager: [phone]  Patient name: Christine Cobb Medical record number: [id number] Date of birth: 1976/01/20 Age: 43 y.o. Gender: female  Primary Care Provider: Patient, No Pcp Per Consultants: cards, palliative, psych Code Status: DNR  Pt Overview and Major Events to Date:    Assessment and Plan: Christine Cobb a 43 y.o.femalepresenting with worseningperipheraledema and dyspnea on exertion. PMH is significant forcurrent every day smoker with history of COPD, HFrEF (EF 20-25%), HTN, Hyperthyroidism, bipolar mood disorder, schizoaffective disorder, and polysubstance abuse.   Acute on chronic HFrEF  Poor EF 10-15%: Stable. Echo 5/7 showing EF 10-15%.  Previously on IV milrinone per cardiology but this has been stopped due to patient noncompliance and loss of multiple PIVs.  Patient is not a candidate for advanced heart failure. prognosis poor -Palliative care consulted, appreciate recs.  -Cardiology following, appreciate recommendations.   - coreg 3.125 BID -Lisinopril 5mg  --Continue to hold heart failure medications Lasix, digoxin, spironolactone.  -Will not start entresto at this time, per cardiology -Strict I's and O's -Daily weights  RLL PNA +/-endocarditis  dyspnea: Acute, stable. Afebrile. Leukocytosis stable ~ 13-14.  BC NG 4days. COVID neg x2. TTEnegative for vegetations and BC no growth thus far.  Patient would require TEE for diagnosis of endocarditis however she is a poor candidate. Currently treating as presumed endocarditis -Continue IV vancomycin(5/7-), Continue IV Levaquin(5/8-5/14)for pneumonia(cefepime not started due to anaphylaxis with amoxicillin).  -f/u blood cultures -Monitor fever curve -Tessalon Perles as needed for cough -CBC with differential nightly  Small-medium sized bilateral LL PE: Acute. Continues to be tachycardic, but improving. Long-term anticoagulation  challenging given her poor compliance and support but we are anticoagulating with xarelto while in the hospital.Currently considered septic emboli until proven otherwise with history of drug use.TTE negative for vegetations, but with severe tricuspid regurg.  poor candidate for TEE, which she has declined. .  -Continue Xarelto 15 mgBID(5/6-,day4)  -IV vancomycin as above  Lack of medical decision-making capacity: Lacks capacity, per psych.  At this time, we are her current medical decision makers, still in the process of reaching out to family with the help with social work. Psych saw pt on 5/11, state she is not a candidate for inpatient psychiatric treatment.  -Social work consulted, regarding disposition plan -Pursue IVC if patient attempts to leave AMA  Bipolar disorder  schizoaffective  MDD  anxiety: Chronic. Not take anything prior to admit, meds as below started by psychiatry. -Patient Received her IV Haldol monthly on 5/7 -haldol 5mg  qhs (5/11-) -Amantadine 100 mg twice daily(5/5-) - trazodone qhs   Homelessness No PCP:  - Social work on board, greatly appreciate help - Case management consulted, appreciate recommendations  Hyponatremia 130>127>128>129.   -Daily evening BMP  Acute AKI on CKD: . Creatinine has been increasing back up.  1.03 2 days ago, now 1.37.  - Monitor BMP, nighttime labs -Avoid nephrotoxic medications as possible  HTN: Chronic, stable. Currently hypo-low normotensive.  106/69 this am.  - lisinopril - Monitor BP  COPD: Chronic, stable. No current symptomatology. - Albuterol as needed  - likely sent home with Spiriva at discharge  Polysubstance Abuse: Chronic. Current daily smoker, history of cocaine and MJ use. UDS positive for cocaine on admit. - Nicotine patch 7 mg - Continue to encourage cessation, patient has voiced the want to discontinue  FEN/GI: heart healthy diet PPx: Full dose Xarelto  Disposition:  undetermined  Subjective:  Patient stating she was having bloody cough,  SOB, and nausea last night.  She says she is still having SOB but is not nauseous this morning.  She says she will wear O2 if she needs it and she has a nebulizer at home and thinks it would help if she used this in the hospital.   Objective: Temp:  [97 F (36.1 C)-97.8 F (36.6 C)] 97.8 F (36.6 C) (05/12 0520) Pulse Rate:  [96-106] 106 (05/12 0520) Resp:  [16-18] 16 (05/12 0520) BP: (96-123)/(82-110) 96/82 (05/12 0520) SpO2:  [34 %-99 %] 34 % (05/12 0520) Weight:  [59.9 kg] 59.9 kg (05/12 0522) Physical Exam: General: alert.  No acute distress.  Sitting up in bed.   Cardiovascular: regular rhythm. Tachycardic rate. No murmurs.   Respiratory: improved air movement from yesterday.  Inspiratory crackles in the lower right lung zone. Patient showing no dyspnea when talking  Abdomen: soft, mildly tender diffusely.  Normal bowel sounds.  Extremities: trace pedal edema.   Psych: patient's speech is pressured and tangential.   Laboratory: Recent Labs  Lab 11/04/18 1842 11/05/18 2038 11/06/18 1640  WBC 13.4* 14.9* 13.0*  HGB 11.2* 11.0* 10.9*  HCT 34.9* 33.8* 33.2*  PLT 310 396 460*   Recent Labs  Lab 11/04/18 1842 11/05/18 2038 11/06/18 1640  NA 127* 128* 129*  K 4.6 5.5* 5.0  CL 88* 91* 89*  CO2 26 26 24   BUN 21* 25* 32*  CREATININE 1.03* 1.18* 1.37*  CALCIUM 8.3* 8.9 8.5*  GLUCOSE 125* 109* 94     Imaging/Diagnostic Tests:   Christine Kitty, MD 11/07/2018, 6:25 AM PGY-1, [facility] FPTS Intern pager: [phone], text pages welcome

## 2018-11-08 DIAGNOSIS — Z008 Encounter for other general examination: Secondary | ICD-10-CM

## 2018-11-08 LAB — BASIC METABOLIC PANEL
Anion gap: 12 (ref 5–15)
BUN: 47 mg/dL — ABNORMAL HIGH (ref 6–20)
CO2: 25 mmol/L (ref 22–32)
Calcium: 8.8 mg/dL — ABNORMAL LOW (ref 8.9–10.3)
Chloride: 90 mmol/L — ABNORMAL LOW (ref 98–111)
Creatinine, Ser: 1.6 mg/dL — ABNORMAL HIGH (ref 0.44–1.00)
GFR calc Af Amer: 46 mL/min — ABNORMAL LOW (ref >60)
GFR calc non Af Amer: 40 mL/min — ABNORMAL LOW (ref >60)
Glucose, Bld: 102 mg/dL — ABNORMAL HIGH (ref 70–99)
Potassium: 5.8 mmol/L — ABNORMAL HIGH (ref 3.5–5.1)
Sodium: 127 mmol/L — ABNORMAL LOW (ref 135–145)

## 2018-11-08 LAB — CBC
HCT: 31.8 % — ABNORMAL LOW (ref 36.0–46.0)
Hemoglobin: 10.4 g/dL — ABNORMAL LOW (ref 12.0–15.0)
MCH: 24.7 pg — ABNORMAL LOW (ref 26.0–34.0)
MCHC: 32.7 g/dL (ref 30.0–36.0)
MCV: 75.5 fL — ABNORMAL LOW (ref 80.0–100.0)
Platelets: 489 10*3/uL — ABNORMAL HIGH (ref 150–400)
RBC: 4.21 MIL/uL (ref 3.87–5.11)
RDW: 17 % — ABNORMAL HIGH (ref 11.5–15.5)
WBC: 11.9 10*3/uL — ABNORMAL HIGH (ref 4.0–10.5)
nRBC: 0.2 % (ref 0.0–0.2)

## 2018-11-08 MED ORDER — HALOPERIDOL 5 MG PO TABS
10.0000 mg | ORAL_TABLET | Freq: Every day | ORAL | Status: DC
  Administered 2018-11-09 – 2018-11-14 (×6): 10 mg via ORAL
  Filled 2018-11-08 (×8): qty 2

## 2018-11-08 MED ORDER — TRAZODONE HCL 100 MG PO TABS
200.0000 mg | ORAL_TABLET | Freq: Every day | ORAL | Status: DC
  Administered 2018-11-08 – 2018-11-14 (×7): 200 mg via ORAL
  Filled 2018-11-08 (×7): qty 2

## 2018-11-08 MED ORDER — DIPHENHYDRAMINE HCL 25 MG PO CAPS
50.0000 mg | ORAL_CAPSULE | Freq: Once | ORAL | Status: AC
  Administered 2018-11-08: 50 mg via ORAL
  Filled 2018-11-08: qty 2

## 2018-11-08 NOTE — Consult Note (Addendum)
Telepsych Consultation   Reason for Consult:  Medication management  Referring Physician:  Dr. Terisa Starr Location of Patient:  [facility] Location of Provider: [facility]   11/08/2018 10:27 AM Vinson Moselle Alisyn Tacuri  MRN:  [id number]  Subjective:   Christine Cobb was last seen on 5/11 for evaluation for inpatient psychiatric hospitalization. Her medications were adjusted (discontinued Abilify in lieu of Haldol) and she was recommended for follow up with outpatient resources. Primary team reports that patients symptoms have worsened. She is tangential in thought process and grandiose. She has been verbally abusive to staff.    On interview, Christine Cobb is disorganized in though process. She mentions irrelevant information. She reports that she works two jobs, has multiple degrees and speaks 3 languages fluently. She also reports that she is homeless. She has been working with [facility] to secure housing. She is able to state why she is admitted to the hospital and what treatment she is receiving. She denies SI, HI or AVH. She reports poor sleep. She normally takes Trazodone 300 mg qhs and Melatonin for sleep. She requests Xanax and pain medications. She reports, "I take my medications and do not sell them." She continues to be intermittently agitated according to primary team.   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  Past Medical History:  Past Medical History:  Diagnosis Date  . Arm pain   .  Bipolar affective disorder, currently manic, mild (HCC)   . CHF (congestive heart failure) (HCC)   . Eye globe prosthesis   . HTN (hypertension)   . Hyperthyroidism   . Sinus tachycardia     Past Surgical History:  Procedure Laterality Date  . DILATION AND CURETTAGE OF UTERUS    . EYE SURGERY     Family History:  Family History  Problem Relation Age of Onset  . Hypertension Other   . Emphysema Other   . Asthma Son   . Diabetes Maternal Uncle   . Diabetes Paternal Grandmother    Family Psychiatric  History: Denies  Social History:  Social History   Substance and Sexual Activity  Alcohol Use Yes   Comment: Last drink: 1/2 beer PTA     Social History   Substance and Sexual Activity  Drug Use Yes  . Types: Marijuana   Comment: Pt sts "anything and everything"    Social History   Socioeconomic History  . Marital status: Married    Spouse name: Not on file  . Number of children: Not on file  . Years of education: Not on file  . Highest education level: Not on file  Occupational History  . Not on file  Social Needs  . Financial resource strain: Not on file  . Food insecurity:    Worry: Not on file    Inability: Not on file  . Transportation needs:    Medical: Not on file    Non-medical: Not on file  Tobacco Use  . Smoking status: Current Every Day Smoker    Types: Cigarettes    Last attempt to quit: 08/21/2013  Years since quitting: 5.2  . Smokeless tobacco: Never Used  Substance and Sexual Activity  . Alcohol use: Yes    Comment: Last drink: 1/2 beer PTA  . Drug use: Yes    Types: Marijuana    Comment: Pt sts "anything and everything"  . Sexual activity: Yes    Birth control/protection: Injection    Comment: crack cocaine  Lifestyle  . Physical activity:    Days per week: Not on file    Minutes per session: Not on file  . Stress: Not on file  Relationships  . Social connections:    Talks on phone: Not on file    Gets together: Not on file     Attends religious service: Not on file    Active member of club or organization: Not on file    Attends meetings of clubs or organizations: Not on file    Relationship status: Not on file  Other Topics Concern  . Not on file  Social History Narrative  . Not on file    Sleep: Poor  Appetite:  Good  Current Medications: Current Facility-Administered Medications  Medication Dose Route Frequency Provider Last Rate Last Dose  . acetaminophen (TYLENOL) tablet 650 mg  650 mg Oral Q6H PRN Lennox Solders, MD   650 mg at 11/08/18 0942  . amantadine (SYMMETREL) capsule 100 mg  100 mg Oral BID Lennox Solders, MD   100 mg at 11/08/18 0933  . benzonatate (TESSALON) capsule 200 mg  200 mg Oral TID PRN Lennox Solders, MD   200 mg at 11/03/18 2242  . calcium carbonate (TUMS - dosed in mg elemental calcium) chewable tablet 200 mg of elemental calcium  1 tablet Oral BID PRN Sandre Kitty, MD   200 mg of elemental calcium at 11/07/18 1654  . carvedilol (COREG) tablet 3.125 mg  3.125 mg Oral BID WC Talbert Forest, [place], DO   3.125 mg at 11/08/18 0933  . feeding supplement (ENSURE ENLIVE) (ENSURE ENLIVE) liquid 237 mL  237 mL Oral TID BM Lennox Solders, MD   237 mL at 11/08/18 0933  . guaiFENesin-dextromethorphan (ROBITUSSIN DM) 100-10 MG/5ML syrup 5 mL  5 mL Oral Q4H PRN Westley Chandler, MD   5 mL at 11/07/18 2201  . haloperidol (HALDOL) tablet 5 mg  5 mg Oral QHS Sandre Kitty, MD   5 mg at 11/07/18 2201  . haloperidol lactate (HALDOL) injection 1 mg  1 mg Intravenous Q6H PRN Melene Plan, MD   1 mg at 11/07/18 1044  . levofloxacin (LEVAQUIN) tablet 750 mg  750 mg Oral Daily Sandre Kitty, MD   750 mg at 11/08/18 0933  . lisinopril (ZESTRIL) tablet 5 mg  5 mg Oral Daily Yates Decamp, MD   5 mg at 11/08/18 0933  . Melatonin TABS 3 mg  3 mg Oral QHS PRN Shirley, [place], DO   3 mg at 11/07/18 2201  . multivitamin with minerals tablet 1 tablet  1 tablet Oral Daily Lennox Solders, MD   1 tablet at  11/08/18 0933  . nicotine (NICODERM CQ - dosed in mg/24 hr) patch 7 mg  7 mg Transdermal Daily Lennox Solders, MD   7 mg at 11/08/18 0933  . polyethylene glycol (MIRALAX / GLYCOLAX) packet 17 g  17 g Oral BID Mullis, Kiersten P, DO   17 g at 11/08/18 0933  . Rivaroxaban (XARELTO) tablet 15 mg  15 mg Oral BID WC Lezlie Octave  C, MD   15 mg at 11/08/18 0933  . traZODone (DESYREL) tablet 100 mg  100 mg Oral QHS Anderson Malta L, DO   100 mg at 11/07/18 2201  . vancomycin (VANCOCIN) 1,250 mg in sodium chloride 0.9 % 250 mL IVPB  1,250 mg Intravenous Q24H Earnie Larsson, RPH 150 mL/hr at 11/07/18 1632      Lab Results:  Results for orders placed or performed during the hospital encounter of 10/29/18 (from the past 48 hour(s))  CBC     Status: Abnormal   Collection Time: 11/06/18  4:40 PM  Result Value Ref Range   WBC 13.0 (H) 4.0 - 10.5 K/uL   RBC 4.38 3.87 - 5.11 MIL/uL   Hemoglobin 10.9 (L) 12.0 - 15.0 g/dL   HCT 16.1 (L) 09.6 - 04.5 %   MCV 75.8 (L) 80.0 - 100.0 fL   MCH 24.9 (L) 26.0 - 34.0 pg   MCHC 32.8 30.0 - 36.0 g/dL   RDW 40.9 (H) 81.1 - 91.4 %   Platelets 460 (H) 150 - 400 K/uL   nRBC 0.0 0.0 - 0.2 %    Comment: Performed at [facility], [address] [place], [place] [address]  Basic metabolic panel     Status: Abnormal   Collection Time: 11/06/18  4:40 PM  Result Value Ref Range   Sodium 129 (L) 135 - 145 mmol/L   Potassium 5.0 3.5 - 5.1 mmol/L   Chloride 89 (L) 98 - 111 mmol/L   CO2 24 22 - 32 mmol/L   Glucose, Bld 94 70 - 99 mg/dL   BUN 32 (H) 6 - 20 mg/dL   Creatinine, Ser 6.21 (H) 0.44 - 1.00 mg/dL   Calcium 8.5 (L) 8.9 - 10.3 mg/dL   GFR calc non Af Amer 48 (L) >60 mL/min   GFR calc Af Amer 56 (L) >60 mL/min   Anion gap 16 (H) 5 - 15    Comment: Performed at [facility], [address], [place], [place] [address]  Vancomycin, trough     Status: Abnormal   Collection Time: 11/06/18  4:40 PM  Result Value Ref Range   Vancomycin Tr 8 (L)  15 - 20 ug/mL    Comment: Performed at [facility], [address], [place], [place] [address]    Blood Alcohol level:  Lab Results  Component Value Date   [facility] <10 10/05/2018   ETH <10 08/23/2018     Musculoskeletal: Strength & Muscle Tone: No atrophy noted. Gait & Station: normal Patient leans: N/A  Psychiatric Specialty Exam: Physical Exam  Nursing note and vitals reviewed. Constitutional: She is oriented to person, place, and time. She appears well-developed and well-nourished.  HENT:  Head: Normocephalic and atraumatic.  Neck: Normal range of motion.  Respiratory: Effort normal.  Musculoskeletal: Normal range of motion.  Neurological: She is alert and oriented to person, place, and time.  Psychiatric: She has a normal mood and affect. Her speech is normal and behavior is normal. Thought content is delusional. Cognition and memory are impaired. She expresses impulsivity.    Review of Systems  Gastrointestinal: Positive for constipation. Negative for diarrhea, nausea and vomiting.  Psychiatric/Behavioral: Positive for substance abuse. Negative for hallucinations and suicidal ideas. The patient has insomnia.   All other systems reviewed and are negative.   Blood pressure 101/79, pulse (!) 105, temperature (!) 97.5 F (36.4 C), temperature source Oral, resp. rate 18, height  (1.626 m),  weight 61.4 kg, last menstrual period 09/13/2018, SpO2 100 %.Body mass index is 23.24 kg/m.  General Appearance: Fairly Groomed  Eye Contact:  Good  Speech:  Clear and Coherent and Pressured  Volume:  Normal  Mood:  Euthymic  Affect:  Congruent but irritable at times.   Thought Process:  Disorganized and Descriptions of Associations: Tangential  Orientation:  Full (Time, Place, and Person)  Thought Content:  Illogical and Delusions  Suicidal Thoughts:  No  Homicidal Thoughts:  No  Memory:  Immediate;   Fair Recent;   Fair Remote;   Fair  Judgement:  Poor  Insight:  Fair   Psychomotor Activity:  Normal  Concentration:  Concentration: Fair and Attention Span: Fair  Recall:  FiservFair  Fund of Knowledge:  Fair  Language:  Good  Akathisia:  No  Handed:  Right  AIMS (if indicated):     Assets:  Desire for Improvement Resilience  ADL's:  Intact  Cognition:  Impaired due to psychiatric condition.   Sleep:   Poor    Assessment:  Christine Cobb is a 43 y.o. female who was admitted with CHF exacerbation due to poor medication compliance. Patient reports poor sleep which is likely exacerbating her symptoms of mood lability and worsening delusional thoughts. She is well known to the psychiatry service and appears more disorganized than her baseline. Recommend inpatient psychiatric hospitalization for stabilization and treatment and increasing Trazodone for sleep.    Treatment Plan Summary: -Patient warrants inpatient psychiatric hospitalization given active psychosis/mood lability.  -Increase Haldol 5 mg to 10 mg qhs for mood stabilization. -Continue Amantadine 100 mg BID for EPS prophylaxis.  -Continue monthly Haldol decanoate 100 mg injection (last given on 5/7).  -Continue Melatonin 3 mg qhs PRN for insomnia. -Increase Trazodone 100 mg qhs to 200 mg qhs for insomnia.  -EKG reviewed and QTc477 on 5/5. Please closely monitor when starting or increasing QTc prolonging agents -Will sign off on patient at this time. Please consult psychiatry again as needed.   This service was provided via telemedicine using a 2-way, interactive audio and video technology.  Names of all persons participating in this telemedicine service and their role in this encounter. Name: Juanetta BeetsJacqueline Krystyl Cannell, DO Role: Psychiatrist   Name: Jerl Santoshamelia Cobb Role: Patient      Cherly BeachJacqueline J Letrell Attwood, DO 11/08/2018, 10:27 AM

## 2018-11-08 NOTE — Progress Notes (Signed)
Pt was upset about 2200 scheduled medications not being passed until 2100. Pt was educated on how we pass scheduled medications a hour before and a hour after scheduled time. Pt was not satisfied with education and requested to speak with charge nurse. After speaking with charge pt requested for a new nurse. Charge took over pt care.

## 2018-11-08 NOTE — Plan of Care (Signed)
°  Problem: Education: °Goal: Knowledge of General Education information will improve °Description: Including pain rating scale, medication(s)/side effects and non-pharmacologic comfort measures °Outcome: Progressing °  °Problem: Health Behavior/Discharge Planning: °Goal: Ability to manage health-related needs will improve °Outcome: Progressing °  °Problem: Clinical Measurements: °Goal: Ability to maintain clinical measurements within normal limits will improve °Outcome: Progressing °Goal: Respiratory complications will improve °Outcome: Progressing °Goal: Cardiovascular complication will be avoided °Outcome: Progressing °  °Problem: Activity: °Goal: Risk for activity intolerance will decrease °Outcome: Progressing °  °Problem: Nutrition: °Goal: Adequate nutrition will be maintained °Outcome: Progressing °  °

## 2018-11-08 NOTE — Progress Notes (Signed)
Patient BP 84/75.  Family medicine notified.

## 2018-11-08 NOTE — Progress Notes (Addendum)
Pt. At the desk yelling and screaming at staff. RN informed pt. That she was unable to get additional dose of melatonin per FMTS on call. New order for Benadryl received. Pt. Refused.

## 2018-11-08 NOTE — Progress Notes (Signed)
Pt. Requesting to speak to the owner of the hospital. [facility], Jonny Ruiz, called. Security paged.

## 2018-11-08 NOTE — Progress Notes (Signed)
Family Medicine Teaching Service Daily Progress Note Intern Pager: [phone]  Patient name: Christine Cobb Medical record number: [id number] Date of birth: 09-04-1975 Age: 43 y.o. Gender: female  Primary Care Provider: Patient, No Pcp Per Consultants: cards, palliative, psych Code Status: DNR  Pt Overview and Major Events to Date:    Assessment and Plan: Christine Cobb a 43 y.o.femalepresenting with worseningperipheraledema and dyspnea on exertion. PMH is significant forcurrent every day smoker with history of COPD, HFrEF (EF 20-25%), HTN, Hyperthyroidism, bipolar mood disorder, schizoaffective disorder, and polysubstance abuse.   Acute on chronic HFrEF  Poor EF 10-15%: Stable. On room air but complaining of SOB. Lasix was given yesterday but output not recorded d/t pt's noncompliance with instructions. Echo 5/7 showing EF 10-15%.  Previously on IV milrinone per cardiology but this has been stopped due to patient noncompliance and loss of multiple PIVs.  Patient is not a candidate for advanced heart failure. prognosis poor -Palliative care consulted, appreciate recs.  -Cardiology following, appreciate recommendations.   - coreg 3.125 BID -Lisinopril 5mg  - lasix 80mg  qdaily --Continue to hold heart failure medications , digoxin, spironolactone.  -Will not start entresto at this time, per cardiology -Strict I's and O's -Daily weights  RLL PNA +/-endocarditis  dyspnea: Acute, stable. Afebrile. Leukocytosis stable ~ 13-14.  BC NG 4days. COVID neg x2. TTEnegative for vegetations and BC no growth thus far.  Patient would require TEE for diagnosis of endocarditis however she is a poor candidate. Currently treating as presumed endocarditis -Continue IV vancomycin(5/7-6/4), day 7/28.   -Continue IV Levaquin(5/8-5/14)for pneumonia(cefepime not started due to anaphylaxis with amoxicillin).  -f/u blood cultures -Monitor fever curve -Tessalon Perles and  guafenacin as needed for cough -CBC with differential nightly  Small-medium sized bilateral LL PE: Acute. No longer tachcyardic.  Long-term anticoagulation challenging given her poor compliance and support but we are anticoagulating with xarelto while in the hospital.Currently considered septic emboli until proven otherwise with history of drug use.TTE negative for vegetations, but with severe tricuspid regurg.  poor candidate for TEE, which she has declined. .  -Continue Xarelto 15 mgBID(5/6-,)  -IV vancomycin as above  Lack of medical decision-making capacity: Lacks capacity, per psych.  At this time, we are her current medical decision makers, still in the process of reaching out to family with the help with social work. Psych saw pt on 5/11, state she is not a candidate for inpatient psychiatric treatment. Discussed with SW regarding guardian ad litem.   -Social work consulted, regarding guardian ad litem.  -Pursue IVC if patient attempts to leave AMA  Bipolar disorder  schizoaffective  MDD  anxiety: Chronic. Patient exhibiting more signs of manic episode over the past 2 days.  Patient also splitting, being extremely pleasant with family medicine staff while having frequent conflicts with nursing staff.   Not take anything prior to admit, meds as below started by psychiatry. - psych consulted regarding worsening mania symptoms.  - Patient Received her IV Haldol monthly on 5/7 -haldol 5mg  qhs (5/11-) -Amantadine 100 mg twice daily(5/5-) - trazodone qhs   Homelessness No PCP:  Will need to contact SW regarding placement today.  - Social work on Theatre manager, greatly appreciate help - Case management consulted, appreciate recommendations  Hyponatremia 130>127>128>129.   -Daily evening BMP  Acute AKI on CKD: . Creatinine has been increasing back up.  1.03 2 days ago, now 1.37.  - Monitor BMP, nighttime labs -Avoid nephrotoxic medications as possible - lasix 40 IV x 1  for  possible cardiorenal syndrome.  HTN: Chronic, stable  low SBP 87-124 in past 24 hrs.   - lisinopril - Monitor BP  COPD: Chronic, stable. No current symptomatology. - Albuterol as needed  - likely sent home with Spiriva at discharge  Polysubstance Abuse: Chronic. Current daily smoker, history of cocaine and MJ use. UDS positive for cocaine on admit. - Nicotine patch 7 mg - Continue to encourage cessation, patient has voiced the want to discontinue  FEN/GI: heart healthy diet PPx: Full dose Xarelto  Disposition: undetermined  Subjective:  Patient stating she was having difficulties with the staff last night.  She did not like people coming in her room and telling her they need to take blood or that she needs to take a pill.  She wanted a second melatonin and they did not give it to her she states.  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  She states she speaks 5 languages and has 4 degrees.    Objective: Temp:  [97.6 F (36.4 C)-98.1 F (36.7 C)] 97.6 F (36.4 C) (05/13 0419) Pulse Rate:  [38-107] 38 (05/13 0419) Resp:  [20-22] 22 (05/12 2005) BP: (87-124)/(56-95) 124/95 (05/12 2005) SpO2:  [90 %-100 %] 90 % (05/13 0419) Physical Exam: General: alert.  No acute distress.  Sitting up in bed. Appears agitated.    Cardiovascular: regular rhythm. Tachycardic rate. No murmurs.   Respiratory: no wheezes or crackles.  Patient showing no dyspnea when talking. Coughing with deep inspiration. Abdomen: soft, nontender  Normal bowel sounds.  Extremities: trace pedal edema.   Psych: patient's speech is pressured and tangential. Having delusions of grandeur and generally more agitated/excitable.    Laboratory: Recent Labs  Lab 11/04/18 1842 11/05/18 2038 11/06/18 1640  WBC 13.4* 14.9* 13.0*  HGB 11.2* 11.0* 10.9*  HCT 34.9* 33.8* 33.2*  PLT 310 396 460*   Recent Labs  Lab 11/04/18 1842 11/05/18 2038 11/06/18 1640  NA 127* 128* 129*  K 4.6  5.5* 5.0  CL 88* 91* 89*  CO2 26 26 24   BUN 21* 25* 32*  CREATININE 1.03* 1.18* 1.37*  CALCIUM 8.3* 8.9 8.5*  GLUCOSE 125* 109* 94     Imaging/Diagnostic Tests:   Christine Kitty, MD 11/08/2018, 6:42 AM PGY-1, [facility] Family Medicine FPTS Intern pager: [phone], text pages welcome

## 2018-11-08 NOTE — Progress Notes (Signed)
CSW consulted with patient regarding inpatient psych recommendation from Dr. Sharma Covert. Patient reports she is not agreeable to this and denies having mental health issues. Patient proceeded to communicate different pieces of information to CSW, difficult to keep patient focused on conversation at hand. CSW reiterated recommendation of inpatient psych and patient states she currently is refusing this, CSW informed patient that if she refuses she will be involuntarily committed. CSW encouraged patient that recommendations for inpatient psych were for patient to receive mental health treatment and have a safe discharge, patient not understanding and continues to deny mental health problems.   CSW will initiate IVC and send referrals inpatient psych bed for patient.   [place], [place] [phone]

## 2018-11-08 NOTE — Progress Notes (Signed)
Interval changes noted. Stable from cardiac standpoint. Please call us back if any further questions.  Elder Negus, MD [facility] [place] Pager: [phone] Office: [phone] If no answer Cell [phone]

## 2018-11-08 NOTE — Progress Notes (Signed)
CSW lvm with [facility] to initiate guardianship process, awaiting call back.   [place], [place] [phone]

## 2018-11-08 NOTE — Progress Notes (Signed)
Pt. requesing additional dose of Melatonin at this time. RN informed by assigned RN that FMTS on the floor to speak with pt. About her request earlier during the shift. Pt. Unable to receive another dose. Pt. Now requesting to speak with the doctors herself. Pt. Stated " I can get what I want. Let me talk to the doctors then myself. I have insomnia". On call for FMTS paged to make aware.

## 2018-11-08 NOTE — Progress Notes (Signed)
Patient refused PO scheduled Haldol.  Only wants IV Haldol.  Attempted to educate, patient refused.  IV Haldol not due until 2330. Patient currently in shower.

## 2018-11-09 LAB — BASIC METABOLIC PANEL
Anion gap: 13 (ref 5–15)
BUN: 38 mg/dL — ABNORMAL HIGH (ref 6–20)
CO2: 26 mmol/L (ref 22–32)
Calcium: 8.7 mg/dL — ABNORMAL LOW (ref 8.9–10.3)
Chloride: 87 mmol/L — ABNORMAL LOW (ref 98–111)
Creatinine, Ser: 1.46 mg/dL — ABNORMAL HIGH (ref 0.44–1.00)
GFR calc Af Amer: 52 mL/min — ABNORMAL LOW (ref >60)
GFR calc non Af Amer: 45 mL/min — ABNORMAL LOW (ref >60)
Glucose, Bld: 93 mg/dL (ref 70–99)
Potassium: 5.4 mmol/L — ABNORMAL HIGH (ref 3.5–5.1)
Sodium: 126 mmol/L — ABNORMAL LOW (ref 135–145)

## 2018-11-09 LAB — CBC
HCT: 30.4 % — ABNORMAL LOW (ref 36.0–46.0)
Hemoglobin: 10.1 g/dL — ABNORMAL LOW (ref 12.0–15.0)
MCH: 25 pg — ABNORMAL LOW (ref 26.0–34.0)
MCHC: 33.2 g/dL (ref 30.0–36.0)
MCV: 75.2 fL — ABNORMAL LOW (ref 80.0–100.0)
Platelets: 508 10*3/uL — ABNORMAL HIGH (ref 150–400)
RBC: 4.04 MIL/uL (ref 3.87–5.11)
RDW: 17 % — ABNORMAL HIGH (ref 11.5–15.5)
WBC: 9.9 10*3/uL (ref 4.0–10.5)
nRBC: 0.2 % (ref 0.0–0.2)

## 2018-11-09 MED ORDER — FUROSEMIDE 40 MG PO TABS
40.0000 mg | ORAL_TABLET | Freq: Every day | ORAL | Status: DC
  Administered 2018-11-09 – 2018-11-11 (×3): 40 mg via ORAL
  Filled 2018-11-09 (×3): qty 1

## 2018-11-09 NOTE — Progress Notes (Signed)
Family Medicine Teaching Service Daily Progress Note Intern Pager: [phone]  Patient name: Micaylah Himmelreich Medical record number: [id number] Date of birth: 06/09/1976 Age: 43 y.o. Gender: female  Primary Care Provider: Patient, No Pcp Per Consultants: cards, palliative, psych Code Status: DNR  Pt Overview and Major Events to Date:    Assessment and Plan: Ruchel Walker Cheekis a 43 y.o.femalepresenting with worseningperipheraledema and dyspnea on exertion which has since resolved.  Patient currently in a manic state and will be transferred to inpatient psychiatric treatment. PMH is significant forcurrent every day smoker with history of COPD, HFrEF (EF 20-25%), HTN, Hyperthyroidism, bipolar mood disorder, schizoaffective disorder, and polysubstance abuse.   Bipolar disorder  schizoaffective  MDD  anxiety: Chronic. Patient exhibiting more signs of manic episode over the past several days. Speech is pressured and at times cluttered making it unintelligible. Having delusions of grandeur  Psych consulted and have agreed to inpatient psych for management of her acute mania.   - psych consulted regarding worsening mania symptoms.  - Patient Received her IV Haldol monthly on 5/7 -haldol 10mg  qhs  -Amantadine 100 mg twice daily(5/5-) - trazodone 200mg  qhs   Acute on chronic HFrEF  Poor EF 10-15%: Stable, exacerbation resolved  Echo 5/7 showing EF 10-15%.  Previously on IV milrinone per cardiology but this has been stopped due to patient noncompliance and loss of multiple PIVs. euvolemic on exam today  Patient is not a candidate for advanced heart failure. prognosis poor -Palliative care consulted, appreciate recs.  -Cardiology signed off,   - coreg 3.125 BID -d/c Lisinopril 5mg  after increase in K - lasix 40mg  qdaily.  --Continue to hold heart failure medications , digoxin, spironolactone.  -Will not start entresto at this time, per cardiology -Strict I's and  O's -Daily weights  RLL PNA +/-endocarditis  dyspnea: Acute, stable. Afebrile. Leukocytosis stable ~ 13-14.  BC NG to date. Marland Kitchen COVID neg x2. TTEnegative for vegetations and BC no growth thus far.  Patient would require TEE for diagnosis of endocarditis however she is a poor candidate. Currently treating as presumed endocarditis -Continue IV vancomycin(5/7-6/4), day 8/28.   -Continue IV Levaquin(5/8-5/14)for  pneumonia(cefepime not started due to anaphylaxis with amoxicillin). last day today.  -f/u blood cultures -Monitor fever curve -Tessalon Perles and guafenacin as needed for cough -CBC with differential nightly  Small-medium sized bilateral LL PE: Acute. No longer tachcyardic.  Long-term anticoagulation challenging given her poor compliance and support but we are anticoagulating with xarelto while in the hospital.Currently considered septic emboli until proven otherwise with history of drug use.TTE negative for vegetations, but with severe tricuspid regurg.  poor candidate for TEE, which she has declined. .  -Continue Xarelto 15 mgBID(5/6-,)  -IV vancomycin as above  Lack of medical decision-making capacity: Lacks capacity, per psych.  At this time, we are her current medical decision makers, still in the process of reaching out to family with the help with social work. Psych saw pt on 5/11, state she is not a candidate for inpatient psychiatric treatment. Discussed with SW regarding guardian ad litem.  -Social work consulted regarding guardian ad litem.  -Pursue IVC if patient attempts to leave AMA  Homelessness No PCP:   - Social work on board, greatly appreciate help - Case management consulted, appreciate recommendations  Hyponatremia 130>127>128>129> 127.   -Daily evening BMP  Hyperkalemia - has been increasing since restarting lisinopril.  5.8 yesterday, 5.4 today.  Patient uncooperative making monitoring difficult.  -d/c lisinopril - daily  bmp  Acute  AKI on CKD: . Creatinine has been increasing back up. 1.03 2 days ago, now 1.60  - Monitor BMP, nighttime labs -Avoid nephrotoxic medications as possible  HTN: Chronic, stable  low SBP 87-124 in past 24 hrs.   - stopped lisinopril - Monitor BP  COPD: Chronic, stable. No current symptomatology. - Albuterol as needed  - likely sent home with Spiriva at discharge  Polysubstance Abuse: Chronic. Current daily smoker, history of cocaine and MJ use. UDS positive for cocaine on admit. - Nicotine patch 7 mg - Continue to encourage cessation, patient has voiced the want to discontinue  FEN/GI: heart healthy diet PPx: Full dose Xarelto  Disposition: undetermined  Subjective:  Patient happy to see me this morning.  She says there was a CT scan done and there were 'no blood clots in her lungs'.  Patient spent the night writing her resume and coming up with a plan for the next two years.  She did not have any nausea or SOB overnight.   Objective: Temp:  [97.4 F (36.3 C)-97.7 F (36.5 C)] 97.7 F (36.5 C) (05/14 0637) Pulse Rate:  [72-105] 101 (05/14 0637) Resp:  [17-18] 18 (05/14 0637) BP: (84-107)/(75-79) 107/78 (05/14 0637) SpO2:  [98 %-100 %] 100 % (05/14 0637) Weight:  [61.4 kg] 61.4 kg (05/13 0945) Physical Exam: General: alert.  No acute distress.  Sitting up in bed. Appears excited.    Cardiovascular: regular rhythm. Regular rate. No murmurs.   Respiratory: no wheezes or crackles.  Patient showing no dyspnea when talking. Coughing with deep inspiration. Abdomen: soft, nontender  Normal bowel sounds.  Extremities: no pedal edema.   Psych: patient's speech is pressured with unintelligible cluttering. Having delusions of grandeur and generally more agitated/excitable, consistent with past two days. .    Laboratory: Recent Labs  Lab 11/05/18 2038 11/06/18 1640 11/08/18 1838  WBC 14.9* 13.0* 11.9*  HGB 11.0* 10.9* 10.4*  HCT 33.8* 33.2* 31.8*  PLT 396  460* 489*   Recent Labs  Lab 11/05/18 2038 11/06/18 1640 11/08/18 1134  NA 128* 129* 127*  K 5.5* 5.0 5.8*  CL 91* 89* 90*  CO2 26 24 25   BUN 25* 32* 47*  CREATININE 1.18* 1.37* 1.60*  CALCIUM 8.9 8.5* 8.8*  GLUCOSE 109* 94 102*     Imaging/Diagnostic Tests:   Sandre Kitty, MD 11/09/2018, 6:38 AM PGY-1, [place] [facility] FPTS Intern pager: [phone], text pages welcome

## 2018-11-09 NOTE — Progress Notes (Addendum)
[facility] is looking at patient file, reports no beds avail today but will call CSW when bed avail for inpatient psych. [facility] is familiar with this patient.   UPDATE: [facility] and any inpatient psych unable to take patient while on IV antibiotics.   [place], [place] [phone]

## 2018-11-09 NOTE — Progress Notes (Signed)
Patient refused her coreg.

## 2018-11-09 NOTE — Progress Notes (Signed)
Palliative Care Progress Note  I have reviewed the chart in detail and I am placing this note for clarification of inpatient code status. LIMITED CODE (meds only) was replaced by DNR order by Dr. Jacinto Halim on 5/10 with the understanding that neither CPR or Intubation should be done-I am in agreement with this. Code status should only apply to a cardiac or respiratory arrest situation at which point she would not survive despite any interventions given her very poor prognosis. Otherwise full scope treatment should be offered and encouraged including any cardiac medications to stabilize abnormal rhythms.  I support DNR order as medically the most appropriate decision for Christine Cobb. She has no legal or surrogate decision maker and lacks appropriate mental health stability to make decisions for herself.   She is also an appropriate candidate for hospice care given her EF of 10-15% and non-adherence to recommended medications. She is receptive to medication specifically geared towards her comfort - including opioids for dyspnea and anxiolytics and antipsychotics such as Haldol, Trazodone and Ativan.  Appreciate Social Work and Primary Teams attention to her discharge plan- with her very advanced heart failure -And high level of medical management needs she may be appropriate for a Long Term Care facility with Hospice Services-she would need psychiatric stabilization which we could be working on while she is admitted.

## 2018-11-09 NOTE — Progress Notes (Signed)
Pharmacy Antibiotic Note  Christine Cobb is a 43 y.o. female admitted on 10/29/2018 now with presumed endocarditis.  Pharmacy has been consulted for Vanco dosing.   ID: Cavitary pneumonia, septic emboli, TTE neg for vegetation. Treating for presumable endocarditis x 4 weeks. WBC 11.9 down., afeb  Vanc 5/7 >> (6/4) Levaquin 5/8 >> (5/14)  5/11 - VP: 30, VT 8 (AUC 437) >> increased to 1250 q24h  5/7 Blood negative 5/3 COVID neg 5/8 COVID Neg  Plan: Vanc 1g q24h (Cr used 1.1) x 4 weeks (repeat levels next week)  Height: 5\' 4"  (162.6 cm) Weight: 138 lb 12.8 oz (63 kg)(scale c) IBW/kg (Calculated) : 54.7  Temp (24hrs), Avg:97.6 F (36.4 C), Min:97.4 F (36.3 C), Max:97.7 F (36.5 C)  Recent Labs  Lab 11/02/18 1528  11/02/18 1916 11/03/18 1816 11/04/18 1842 11/05/18 2038 11/06/18 1640 11/08/18 1134 11/08/18 1838 11/09/18 0627  WBC  --    < > 14.4* 14.5* 13.4* 14.9* 13.0*  --  11.9*  --   CREATININE  --    < > 1.06* 1.06* 1.03* 1.18* 1.37* 1.60*  --  1.46*  LATICACIDVEN 2.1*  --  1.6  --   --   --   --   --   --   --   VANCOTROUGH  --   --   --   --   --   --  8*  --   --   --   VANCOPEAK  --   --   --   --   --  30  --   --   --   --    < > = values in this interval not displayed.    Estimated Creatinine Clearance: 44.2 mL/min (A) (by C-G formula based on SCr of 1.46 mg/dL (H)).    Allergies  Allergen Reactions  . Amoxil [Amoxicillin] Anaphylaxis    Did it involve swelling of the face/tongue/throat, SOB, or low BP? yes Did it involve sudden or severe rash/hives, skin peeling, or any reaction on the inside of your mouth or nose? yes Did you need to seek medical attention at a hospital or doctor's office? unknown When did it last happen?unknoen If all above answers are "NO", may proceed with cephalosporin use.   Marland Kitchen Hydroxyzine     Other reaction(s): Bleeding  . Ketorolac Tromethamine Hives and Swelling  . Prenatal [B-Plex Plus] Nausea Only  . Tegretol  [Carbamazepine] Other (See Comments)    Swelling, skin peeling, skin discoloration  . B-Plex Plus Nausea Only    Prenatal   . Tegretol [Carbamazepine] Swelling and Other (See Comments)    Skin peeling Skin discoloring      Christine Cobb, PharmD, BCPS Clinical Staff Pharmacist Christine Cobb 11/09/2018 10:40 AM

## 2018-11-09 NOTE — Plan of Care (Signed)
Patient refusing to take Corieg, or follow fluid restrictions.  Education on the importance was reiterated to patient.

## 2018-11-10 LAB — BASIC METABOLIC PANEL
Anion gap: 9 (ref 5–15)
BUN: 32 mg/dL — ABNORMAL HIGH (ref 6–20)
CO2: 27 mmol/L (ref 22–32)
Calcium: 8.7 mg/dL — ABNORMAL LOW (ref 8.9–10.3)
Chloride: 96 mmol/L — ABNORMAL LOW (ref 98–111)
Creatinine, Ser: 1.38 mg/dL — ABNORMAL HIGH (ref 0.44–1.00)
GFR calc Af Amer: 55 mL/min — ABNORMAL LOW (ref >60)
GFR calc non Af Amer: 48 mL/min — ABNORMAL LOW (ref >60)
Glucose, Bld: 126 mg/dL — ABNORMAL HIGH (ref 70–99)
Potassium: 4.6 mmol/L (ref 3.5–5.1)
Sodium: 132 mmol/L — ABNORMAL LOW (ref 135–145)

## 2018-11-10 LAB — CBC
HCT: 31.2 % — ABNORMAL LOW (ref 36.0–46.0)
Hemoglobin: 10 g/dL — ABNORMAL LOW (ref 12.0–15.0)
MCH: 24.7 pg — ABNORMAL LOW (ref 26.0–34.0)
MCHC: 32.1 g/dL (ref 30.0–36.0)
MCV: 77 fL — ABNORMAL LOW (ref 80.0–100.0)
Platelets: 543 10*3/uL — ABNORMAL HIGH (ref 150–400)
RBC: 4.05 MIL/uL (ref 3.87–5.11)
RDW: 17.4 % — ABNORMAL HIGH (ref 11.5–15.5)
WBC: 9.4 10*3/uL (ref 4.0–10.5)
nRBC: 0.5 % — ABNORMAL HIGH (ref 0.0–0.2)

## 2018-11-10 MED ORDER — DIPHENHYDRAMINE HCL 25 MG PO CAPS
50.0000 mg | ORAL_CAPSULE | Freq: Once | ORAL | Status: AC
  Administered 2018-11-10: 50 mg via ORAL
  Filled 2018-11-10: qty 2

## 2018-11-10 NOTE — TOC Progression Note (Addendum)
Transition of Care [facility]) - Progression Note    Patient Details  Name: Christine Cobb MRN: [id number] Date of Birth: Aug 08, 1975  Transition of Care [facility] CM/SW Contact  Margarito Liner, LCSW Phone Number: 11/10/2018, 1:10 PM  Clinical Narrative: CSW called Inetta Fermo at [facility] to notify her that patient is stable for discharge whenever they have a bed. Will go ahead and fax over IVC paperwork.  3:36 pm: Per Inetta Fermo at [facility], no updates yet. They did receive the IVC paperwork.  Expected Discharge Plan: Homeless Shelter Barriers to Discharge: Continued Medical Work up  Expected Discharge Plan and Services Expected Discharge Plan: Homeless Shelter   Discharge Planning Services: CM Consult Post Acute Care Choice: NA Living arrangements for the past 2 months: Homeless Shelter                   DME Agency: NA       HH Arranged: NA HH Agency: NA         Social Determinants of Health (SDOH) Interventions    Readmission Risk Interventions No flowsheet data found.

## 2018-11-10 NOTE — Progress Notes (Signed)
Family Medicine Teaching Service Daily Progress Note Intern Pager: [phone]  Patient name: Christine Cobb Medical record number: [id number] Date of birth: [birth year] Age: 43 y.o. Gender: female  Primary Care Provider: Patient, No Pcp Per Consultants: cards, palliative, psych Code Status: DNR  Pt Overview and Major Events to Date:    Assessment and Plan: Aloha Walker Cheekis a 43 y.o.femalepresenting with worseningperipheraledema and dyspnea on exertion which has since resolved.  Patient currently in a manic state and will be transferred to inpatient psychiatric treatment. PMH is significant forcurrent every day smoker with history of COPD, HFrEF (EF 20-25%), HTN, Hyperthyroidism, bipolar mood disorder, schizoaffective disorder, and polysubstance abuse.   Bipolar disorder  schizoaffective  MDD  anxiety: Chronic. Patient still exhibiting signs of acute mania. No longer on IV abx. Medically appropriate for inpatient psych. - psych consulted regarding worsening mania symptoms.  - Patient Received her IV Haldol monthly on 5/7 -haldol 10mg  qhs  -Amantadine 100 mg twice daily(5/5-) - trazodone 200mg  qhs   Acute on chronic HFrEF  Poor EF 10-15%: Stable, exacerbation resolved  Echo 5/7 showing EF 10-15%. Prognosis poor especially in light of her noncompliance and difficult social situation. euvolemic on exam today. weight today the same as yesterday. -Palliative care consulted, appreciate recs.  -Cardiology signed off,   - coreg 3.125 BID, patient refusing.  - lasix 40mg  qdaily.  --Continue to hold heart failure medications ,lasix,  digoxin, spironolactone.  -Will not start entresto at this time, per cardiology -Strict I's and O's -Daily weights  Presumed endocarditis Acute, stable. Afebrile. No longer dyspneic.Leukocytosis decreasing, now 9.9. BC NG to date. Marland Kitchen COVID neg x2. spoke with ID: given her lack of bacteria on blood cx, neg TTE, it is unlikely it's  endocarditis and there is no need to continue iv abx. -D/c IV vancomycin(5/7-6/4), day 8/28.   -f/u blood cultures -Monitor fever curve -Tessalon Perles and guafenacin as needed for cough -CBC with differential nightly  Small-medium sized bilateral LL PE: Acute. No longer tachcyardic.  Long-term anticoagulation challenging given her poor compliance and support but we are anticoagulating with xarelto while in the hospital.Currently considered septic emboli until proven otherwise with history of drug use.TTE negative for vegetations, but with severe tricuspid regurg.  poor candidate for TEE, which she has declined. .  -Continue Xarelto 15 mgBID(5/6-5/26), then 20mg  qdaily.   -IV vancomycin as above  Lack of medical decision-making capacity: Lacks capacity, per psych.  At this time, we are her current medical decision makers, still in the process of reaching out to family with the help with social work.  Discussed with SW regarding guardian ad litem.  -Social work consulted regarding guardian ad litem.  -Pursue IVC if patient attempts to leave AMA  Homelessness No PCP:   - Social work on board, greatly appreciate help - Case management consulted, appreciate recommendations  Hyponatremia 130>127>128>129> 127.   -Daily evening BMP  Hyperkalemia - has been increasing since restarting lisinopril.  5.8 yesterday, 5.4 today.  Patient uncooperative making monitoring difficult.  -d/c lisinopril - daily bmp  Acute AKI on CKD: . Creatinine has been increasing back up. 1.03 2 days ago, now 1.60  - Monitor BMP, nighttime labs -Avoid nephrotoxic medications as possible  HTN: Chronic, stable  normotensive   - stopped lisinopril - Monitor BP  COPD: Chronic, stable. No current symptomatology. - Albuterol as needed  - likely sent home with Spiriva at discharge  Polysubstance Abuse: Chronic. Current daily smoker, history of cocaine and MJ use. UDS  positive for cocaine on  admit. - Nicotine patch 7 mg - Continue to encourage cessation, patient has voiced the want to discontinue  FEN/GI: heart healthy diet PPx: Full dose Xarelto  Disposition: medically stable for discharge to inpatient psychiatric unit.   Subjective:  Patient greeted me by saying '[facility] gorgeous!'.  She says she had to 'fire' her nurse today because she wouldn't give her the medications she needs, but she likes her new nurse so she is in a good mood now.    Objective: Temp:  [97.5 F (36.4 C)-98.6 F (37 C)] 98.6 F (37 C) (05/15 0348) Pulse Rate:  [100-108] 108 (05/15 0348) Resp:  [18-20] 20 (05/15 0348) BP: (98-112)/(69-95) 112/95 (05/15 0348) SpO2:  [100 %] 100 % (05/15 0348) Weight:  [62.9 kg-63 kg] 62.9 kg (05/15 0341) Physical Exam: General: alert.  No acute distress.  Sitting up in bed. Appears excited.    Cardiovascular: regular rhythm. Regular rate. No murmurs.   Respiratory: no wheezes or crackles.  Patient showing no dyspnea when talking.  Abdomen: soft, nontender  Normal bowel sounds.  Extremities: no pedal edema.   Psych: mood is elevated.    Laboratory: Recent Labs  Lab 11/06/18 1640 11/08/18 1838 11/09/18 1750  WBC 13.0* 11.9* 9.9  HGB 10.9* 10.4* 10.1*  HCT 33.2* 31.8* 30.4*  PLT 460* 489* 508*   Recent Labs  Lab 11/06/18 1640 11/08/18 1134 11/09/18 0627  NA 129* 127* 126*  K 5.0 5.8* 5.4*  CL 89* 90* 87*  CO2 24 25 26   BUN 32* 47* 38*  CREATININE 1.37* 1.60* 1.46*  CALCIUM 8.5* 8.8* 8.7*  GLUCOSE 94 102* 93     Imaging/Diagnostic Tests:   Sandre Kitty, MD 11/10/2018, 6:34 AM PGY-1, [place] [facility] FPTS Intern pager: [phone], text pages welcome

## 2018-11-10 NOTE — Care Management Important Message (Signed)
Important Message  Patient Details  Name: Girdie Fisch MRN: [id number] Date of Birth: 1975-11-27   Medicare Important Message Given:  Yes    Christen Bedoya Stefan Church 11/10/2018, 1:59 PM

## 2018-11-10 NOTE — Progress Notes (Signed)
Spoke with Dr. Luciana Axe at infectious disease regarding the need to continue Abx for presumed endocarditis.  He agreed that given that the TTE was negative, the inability to perform TEE, and the absence of bacterial growth in blood cultures for 6 days indicates that it would be unlikely the patient actually had endocarditis and it was not necessary to continue antibiotics.  Patient has already completed a course of levaquin.  Vancomycin will be stopped at this time. No further antibiotic treatment necessary.

## 2018-11-11 LAB — BASIC METABOLIC PANEL
Anion gap: 13 (ref 5–15)
BUN: 32 mg/dL — ABNORMAL HIGH (ref 6–20)
CO2: 26 mmol/L (ref 22–32)
Calcium: 8.9 mg/dL (ref 8.9–10.3)
Chloride: 94 mmol/L — ABNORMAL LOW (ref 98–111)
Creatinine, Ser: 1.32 mg/dL — ABNORMAL HIGH (ref 0.44–1.00)
GFR calc Af Amer: 58 mL/min — ABNORMAL LOW (ref >60)
GFR calc non Af Amer: 50 mL/min — ABNORMAL LOW (ref >60)
Glucose, Bld: 109 mg/dL — ABNORMAL HIGH (ref 70–99)
Potassium: 4.6 mmol/L (ref 3.5–5.1)
Sodium: 133 mmol/L — ABNORMAL LOW (ref 135–145)

## 2018-11-11 LAB — CBC
HCT: 31.2 % — ABNORMAL LOW (ref 36.0–46.0)
Hemoglobin: 9.9 g/dL — ABNORMAL LOW (ref 12.0–15.0)
MCH: 24.9 pg — ABNORMAL LOW (ref 26.0–34.0)
MCHC: 31.7 g/dL (ref 30.0–36.0)
MCV: 78.6 fL — ABNORMAL LOW (ref 80.0–100.0)
Platelets: 574 10*3/uL — ABNORMAL HIGH (ref 150–400)
RBC: 3.97 MIL/uL (ref 3.87–5.11)
RDW: 17.8 % — ABNORMAL HIGH (ref 11.5–15.5)
WBC: 10.3 10*3/uL (ref 4.0–10.5)
nRBC: 0.5 % — ABNORMAL HIGH (ref 0.0–0.2)

## 2018-11-11 MED ORDER — FUROSEMIDE 80 MG PO TABS
80.0000 mg | ORAL_TABLET | Freq: Every day | ORAL | Status: DC
  Administered 2018-11-12 – 2018-11-14 (×3): 80 mg via ORAL
  Filled 2018-11-11 (×3): qty 1

## 2018-11-11 NOTE — TOC Progression Note (Signed)
Transition of Care [facility]) - Progression Note    Patient Details  Name: Christine Cobb MRN: [id number] Date of Birth: 1975-12-30  Transition of Care [facility]) CM/SW Contact  Althea Charon, LCSW Phone Number: 11/11/2018, 10:29 AM  Clinical Narrative:   CSW spoke with [facility] representative Minerva Areola. Minerva Areola stated at this time patient does not have a bed available at [facility]. Minerva Areola stated to CSW to please call again tomorrow for bed availability      Expected Discharge Plan: Homeless Shelter Barriers to Discharge: Continued Medical Work up  Expected Discharge Plan and Services Expected Discharge Plan: Homeless Shelter   Discharge Planning Services: CM Consult Post Acute Care Choice: NA Living arrangements for the past 2 months: Homeless Shelter                   DME Agency: NA       HH Arranged: NA HH Agency: NA         Social Determinants of Health (SDOH) Interventions    Readmission Risk Interventions No flowsheet data found.

## 2018-11-11 NOTE — Progress Notes (Signed)
Family Medicine Teaching Service Daily Progress Note Intern Pager: [phone]  Patient name: Christine Cobb Medical record number: [id number] Date of birth: 12-27-1975 Age: 43 y.o. Gender: female  Primary Care Provider: Patient, No Pcp Per Consultants: Cardiology (signed off), CSW, palliative, psychiatry Code Status: DNR  Pt Overview and Major Events to Date:  5/3 Admitted to FPTS, CXR R>L pulmonary effusion, RUL opacity 5/5 CTA with BL pulmonary emboli, patient start on heparin gtt 5/6 Heparin gtt d/c'd, started Xarelto 5/7: Vancomycin started for presumed endocarditis 5/8: Levaquin started for pneumonia 5/13: Psych deemed that patient meets inpatient psych criteria 5/14: Completed Levaquin 5/15: Vancomycin discontinued due to no indication of endocarditis  Assessment and Plan: Christine Cobb a 42 y.o.femalepresenting with worseningperipheraledema and dyspnea on exertion which has since resolved.  Patient currently in a manic state and will be transferred to inpatient psychiatric treatment. PMH is significant forcurrent every day smoker with history of COPD, HFrEF (EF 20-25%), HTN, Hyperthyroidism, bipolar mood disorder, schizoaffective disorder, and polysubstance abuse.   Bipolar disorder  schizoaffective  MDD  anxiety: Chronic. Patient continues to exhibit signs of acute mania.  No longer on IV antibiotics. Medically stable for inpatient psych. -Psychiatry recommended inpatient psych. -PatientReceived her IV Haldol monthly on 5/7 -Continue Haldol 10 mg nightly -Amantadine 100 mg twice daily(5/5-) - trazodone 200mg  qhs   Acute on chronicHFrEF  Poor EF 10-15%: Resolved  Echo 5/7 showing EF 10-15%. Prognosis poor especially in light of her noncompliance and difficult social situation. euvolemic on exam today. weight today the same as yesterday. -Palliative care consulted, appreciate recs.  -Cardiology signed off -Continue Coreg 3.125 twice daily.    -Continue Lasix 40 mg daily. -Continue to hold heart failure medications digoxin, spironolactone.  -DNR per cardiology -Strict I's and O's -Daily weights  Small-medium sized bilateral LL PE: Acute. Long-term anticoagulation challenging given her poor compliance and support but we are anticoagulating with xarelto while in the hospital. -Continue Xarelto 15 mgBID(5/6-5/26), then 20mg  qdaily.   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  Patient no longer dyspneic.  Remains afebrile.  Leukocytosis stable.  -Continue to monitor off of vancomycin   Lack of medical decision-making capacity: Lacks capacity, per psych. At this time, we are her current medical decision makers, still in the process of reaching out to family with the help with social work.   SW is working on getting patient guardian ad litem. -Social work consulted regarding guardian ad litem.  -Pursue IVC if patient attempts to leave AMA  Homelessness No PCP:   - Social work on board, greatly appreciate help - Case management consulted, appreciate recommendations  Hyponatremia, stable, improving 130>127>>132   -Daily evening BMP  CKD, stage III:  Creatinine began trending up after starting lisinopril.  However after discontinuation of lisinopril, creatinine has improved to 1.38 5/16. -Monitor BMP, nighttime labs -Avoid nephrotoxic medications as possible  HTN: Chronic, stable  normotensive  on carvedilol - Monitor BP  COPD: Chronic, stable. No current symptomatology. - Albuterol as needed  - Discharged with Spiriva   Polysubstance Abuse: Chronic. Current daily smoker, history of cocaine and MJ use. UDS positive for cocaine on admit. - Nicotine patch 7 mg - Continue to encourage cessation, patient has voiced the want to discontinue  FEN/GI: heart healthy diet PPx: Full dose Xarelto  Disposition: Medically stable  for inpatient psych placement  Subjective:  Patient sitting up in bed fully clothed and greeted by praying for her  healthcare team.  She states that she would like to get a job and go home and be clean.  She has very tangential thoughts and pressured speech.  She continues to request to get her trazodone increased as well as melatonin increased to twice daily.  She states she has insomnia and cannot sleep.  She does state that last night was the first night that she slept since being here.  Objective: Temp:  [97.5 F (36.4 C)-98.6 F (37 C)] 97.9 F (36.6 C) (05/16 0813) Pulse Rate:  [110-120] 113 (05/16 0813) Resp:  [18-20] 18 (05/16 0813) BP: (98-135)/(76-104) 110/90 (05/16 0813) SpO2:  [97 %-100 %] 98 % (05/16 0813) Weight:  [63.6 kg] 63.6 kg (05/16 0531) Physical Exam: General: NAD, alert, sitting up in bed.  Appears excited Cardiovascular: Regular rhythm, regular rate.  No M/R/G Respiratory: CTA B.  No wheezes or crackles noted.  Able to talk in full sentences Abdomen: Soft, nontender Extremities: 1+ pitting edema to knees Psych: Mood is elevated.  Tangential speech.  Pressured speech.  Laboratory: Recent Labs  Lab 11/08/18 1838 11/09/18 1750 11/10/18 1759  WBC 11.9* 9.9 9.4  HGB 10.4* 10.1* 10.0*  HCT 31.8* 30.4* 31.2*  PLT 489* 508* 543*   Recent Labs  Lab 11/08/18 1134 11/09/18 0627 11/10/18 1759  NA 127* 126* 132*  K 5.8* 5.4* 4.6  CL 90* 87* 96*  CO2 25 26 27   BUN 47* 38* 32*  CREATININE 1.60* 1.46* 1.38*  CALCIUM 8.8* 8.7* 8.7*  GLUCOSE 102* 93 126*    Imaging/Diagnostic Tests: None in past 24 hours   Christine Cobb, [place], DO 11/11/2018, 9:36 AM PGY-2, [place] [facility] FPTS Intern pager: [phone], text pages welcome

## 2018-11-11 NOTE — Progress Notes (Signed)
Patient removed IV and is refusing to have new IV inserted at this time. Will try again in am.

## 2018-11-11 NOTE — Progress Notes (Signed)
PT refusing heart monitor. She Stated " It wasnt hooked on me for days" Will monitor.

## 2018-11-12 LAB — BASIC METABOLIC PANEL
Anion gap: 10 (ref 5–15)
BUN: 28 mg/dL — ABNORMAL HIGH (ref 6–20)
CO2: 25 mmol/L (ref 22–32)
Calcium: 8.2 mg/dL — ABNORMAL LOW (ref 8.9–10.3)
Chloride: 94 mmol/L — ABNORMAL LOW (ref 98–111)
Creatinine, Ser: 1.04 mg/dL — ABNORMAL HIGH (ref 0.44–1.00)
GFR calc Af Amer: >60 mL/min (ref >60)
GFR calc non Af Amer: >60 mL/min (ref >60)
Glucose, Bld: 157 mg/dL — ABNORMAL HIGH (ref 70–99)
Potassium: 4.8 mmol/L (ref 3.5–5.1)
Sodium: 129 mmol/L — ABNORMAL LOW (ref 135–145)

## 2018-11-12 NOTE — Progress Notes (Signed)
Family Medicine Teaching Service Daily Progress Note Intern Pager: [phone]  Patient name: Christine Cobb Medical record number: [id number] Date of birth: 10/18/1975 Age: 43 y.o. Gender: female  Primary Care Provider: Patient, No Pcp Per Consultants: Cardiology (signed off), CSW, palliative, psychiatry Code Status: DNR  Pt Overview and Major Events to Date:  5/3 Admitted to FPTS, CXR R>L pulmonary effusion, RUL opacity 5/5 CTA with BL pulmonary emboli, patient start on heparin gtt 5/6 Heparin gtt d/c'd, started Xarelto 5/7: Vancomycin started for presumed endocarditis 5/8: Levaquin started for pneumonia 5/13: Psych deemed that patient meets inpatient psych criteria 5/14: Completed Levaquin 5/15: Vancomycin discontinued due to no indication of endocarditis 5/17: Increased Lasix dose to 80 mg daily  Assessment and Plan: Christine Cobb a 42 y.o.femalepresenting with worseningperipheraledema and dyspnea on exertion which has since resolved.  Patient currently in a manic state and will be transferred to inpatient psychiatric treatment. PMH is significant forcurrent every day smoker with history of COPD, HFrEF (EF 20-25%), HTN, Hyperthyroidism, bipolar mood disorder, schizoaffective disorder, and polysubstance abuse.   Bipolar disorder  schizoaffective  MDD  anxiety: Chronic. Patient continues to exhibit signs of acute mania but appears to be improving with antipsychotic medications.  Has continually refused medications and pulled out IV but is redirectable.  No longer on IV antibiotics. Medically stable for inpatient psych. -Psychiatry recommended inpatient psych -PatientReceived her IV Haldol monthly on 5/7 -Continue Haldol 10 mg nightly - Amantadine 100 mg BID(5/5-) - trazodone 200mg  qhs   Acute on chronicHFrEF  Poor EF 10-15%: Resolved  Echo 5/7 showing EF 10-15%. Prognosis poor especially in light of her noncompliance and difficult social situation.2+  pitting edema to thigh bilaterally. Wt stable 63.8>63.9. Unsure of dry weight. 4.4L UOP overnight, net 3.1L. S/p increase in Lasix dose from 40mg  to 80mg  QD.  -Palliative care consulted, appreciate recs.  -Cardiology signed off -Continue Coreg 3.125 BID (patient continues to refuse this medication) - Given hyponatremia, will continue Lasix 80 mg daily -Continue to hold HF meds digoxin, spironolactone.  -DNR per cardiology -Strict I's and O's -Daily weights  Small-medium sized bilateral LL PE: Acute. Long-term anticoagulation challenging given her poor compliance and support but we are anticoagulating with xarelto while in the hospital. - Continue Xarelto 15 mgBID(5/6-5/26), then 20mg  qdaily.   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  Patient no longer dyspneic, remains afebrile, and leukocytosis stable.  -Continue to monitor off of vancomycin   Lack of medical decision-making capacity: Lacks capacity, per psych. At this time, we are her current medical decision makers. Still in the process of reaching out to family with the help with social work.SW is working on getting patient guardian ad litem. -Social work consulted regarding guardian ad litem.  -Pursue IVC if patient attempts to leave AMA  Homelessness No PCP:   - Social work on board, greatly appreciate help - Case management consulted, appreciate recommendations  Hyponatremia, stable, improving 127>>132>133>129.  Likely a result of her poor cardiac function and diuresis. -Daily evening BMP  CKD, stage III:  Creatinine continues to improve s/p discontinuation of Lisinopril plus diuresis. Cr 1.32>1.04 today.  -Monitor BMP, nighttime labs -Avoid nephrotoxic medications as possible  HTN: Chronic, stable BP normotensive (98/73) this AM.  Currently on carvedilol. - Monitor BP  COPD: Chronic, stable. No current symptomatology. - Albuterol  as needed  - Discharged with Spiriva   Polysubstance Abuse: Chronic. Current daily smoker, history of cocaine and MJ  use. UDS positive for cocaine on admit. - Nicotine patch 7 mg - Continue to encourage cessation, patient has voiced the want to discontinue  FEN/GI: heart healthy diet PPx: Full dose Xarelto  Disposition: Medically stable for inpatient psych placement  Subjective:  Patient says that she would like to go home soon. She notes she would like her lasix increased so she can get the fluid off and go home. She also notes the xarelto is making her bruise and she doesn't want to take a medicine that makes her bruise. Explained to her the importance of Xarelto to prevent blood clots in her lungs and if they were to recur it could kill her. After this explanation, she understood and agreed to continue.  Objective: Temp:  [97.3 F (36.3 C)-98.6 F (37 C)] 98.5 F (36.9 C) (05/18 0628) Pulse Rate:  [108-121] 116 (05/18 0628) Resp:  [18] 18 (05/18 0628) BP: (98-117)/(73-91) 98/73 (05/18 0628) SpO2:  [99 %-100 %] 99 % (05/18 0628) Weight:  [63.9 kg] 63.9 kg (05/18 0628) Physical Exam: General: walking back from bathroom naked but does put clothes on shortly after, well nourished, well developed, in no acute distress with non-toxic appearance Neck: supple, normal ROM CV: mildly tachycardic, 2+ pitting edema to thigh bilaterally Lungs: clear to auscultation bilaterally with normal work of breathing, able to talk in full sentences Abdomen: soft, very tender to palpation, distended, normoactive bowel sounds Skin: warm, dry Extremities: warm and well perfused, 2+ pitting edema to thighs bilaterally MSK: ROM grossly intact, strength intact, gait normal Neuro: Alert and oriented x3 Psych: speech less pressured but does have moments that are unrecognizable at times, tangential at times   Laboratory: Recent Labs  Lab 11/09/18 1750 11/10/18 1759 11/11/18 1853  WBC 9.9 9.4 10.3   HGB 10.1* 10.0* 9.9*  HCT 30.4* 31.2* 31.2*  PLT 508* 543* 574*   Recent Labs  Lab 11/10/18 1759 11/11/18 1853 11/12/18 1806  NA 132* 133* 129*  K 4.6 4.6 4.8  CL 96* 94* 94*  CO2 27 26 25   BUN 32* 32* 28*  CREATININE 1.38* 1.32* 1.04*  CALCIUM 8.7* 8.9 8.2*  GLUCOSE 126* 109* 157*    Imaging/Diagnostic Tests: None in past 24 hours   Joana Reamer, DO 11/13/2018, 9:11 AM PGY-1, [facility] FPTS Intern pager: [phone], text pages welcome

## 2018-11-12 NOTE — Progress Notes (Signed)
Patient had red- tinged bowel movement. Patient advised to collect next bowel movement so it can be tested for blood. Appropriated collection container placed in toilet.

## 2018-11-12 NOTE — Progress Notes (Signed)
Patient  took her iv out and refuse to be stick for new site. MD notified, will continue to monitor the patient.

## 2018-11-12 NOTE — Progress Notes (Addendum)
Family Medicine Teaching Service Daily Progress Note Intern Pager: [phone]  Patient name: Christine Cobb Medical record number: [id number] Date of birth: 07-Apr-1976 Age: 43 y.o. Gender: female  Primary Care Provider: Patient, No Pcp Per Consultants: Cardiology (signed off), CSW, palliative, psychiatry Code Status: DNR  Pt Overview and Major Events to Date:  5/3 Admitted to FPTS, CXR R>L pulmonary effusion, RUL opacity 5/5 CTA with BL pulmonary emboli, patient start on heparin gtt 5/6 Heparin gtt d/c'd, started Xarelto 5/7: Vancomycin started for presumed endocarditis 5/8: Levaquin started for pneumonia 5/13: Psych deemed that patient meets inpatient psych criteria 5/14: Completed Levaquin 5/15: Vancomycin discontinued due to no indication of endocarditis 5/17: Increased Lasix dose to 80 mg daily  Assessment and Plan: Christine Cobb a 43 y.o.femalepresenting with worseningperipheraledema and dyspnea on exertion which has since resolved.  Patient currently in a manic state and will be transferred to inpatient psychiatric treatment. PMH is significant forcurrent every day smoker with history of COPD, HFrEF (EF 20-25%), HTN, Hyperthyroidism, bipolar mood disorder, schizoaffective disorder, and polysubstance abuse.   Bipolar disorder  schizoaffective  MDD  anxiety: Chronic. Patient continues to exhibit signs of acute mania.  Has continually refused medications and pulled out IV but is redirectable.  No longer on IV antibiotics. Medically stable for inpatient psych. -Psychiatry recommended inpatient psych -PatientReceived her IV Haldol monthly on 5/7 -Continue Haldol 10 mg nightly -Amantadine 100 mg twice daily(5/5-) - trazodone 200mg  qhs   Acute on chronicHFrEF  Poor EF 10-15%: Resolved  Echo 5/7 showing EF 10-15%. Prognosis poor especially in light of her noncompliance and difficult social situation. euvolemic on exam today. Weight 5/17 is stable from  5/16.  Will increase patient's Lasix dose from 40 mg PO daily to 80 mg starting 5/17.  Patient had 2.4 L recorded urine output over the past 24 hours but has 2+ pedal edema on exam 5/17. -Palliative care consulted, appreciate recs.  -Cardiology signed off -Continue Coreg 3.125 twice daily (patient continues to refuse this medication) -Continue Lasix 80 mg daily, consider increasing this to twice daily depending on her urine output for the first half of the day -Continue to hold heart failure medications digoxin, spironolactone.  -DNR per cardiology -Strict I's and O's -Daily weights  Small-medium sized bilateral LL PE: Acute. Long-term anticoagulation challenging given her poor compliance and support but we are anticoagulating with xarelto while in the hospital. -Continue Xarelto 15 mgBID(5/6-5/26), then 20mg  qdaily.   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  Patient no longer dyspneic.  Remains afebrile.  Leukocytosis stable.  -Continue to monitor off of vancomycin   Lack of medical decision-making capacity: Lacks capacity, per psych. At this time, we are her current medical decision makers, still in the process of reaching out to family with the help with social work.   SW is working on getting patient guardian ad litem. -Social work consulted regarding guardian ad litem.  -Pursue IVC if patient attempts to leave AMA  Homelessness No PCP:   - Social work on board, greatly appreciate help - Case management consulted, appreciate recommendations  Hyponatremia, stable, improving 130>127>>132>133.  Likely a result of her poor cardiac function. -Daily evening BMP  CKD, stage III:  Creatinine began trending up after starting lisinopril.  However after discontinuation of lisinopril, creatinine has improved to 1.32 5/16. -Monitor BMP, nighttime labs -Avoid nephrotoxic medications as  possible  HTN: Chronic, stable  normotensive  on carvedilol - Monitor BP  COPD: Chronic, stable. No current symptomatology. - Albuterol as needed  - Discharged with Spiriva   Polysubstance Abuse: Chronic. Current daily smoker, history of cocaine and MJ use. UDS positive for cocaine on admit. - Nicotine patch 7 mg - Continue to encourage cessation, patient has voiced the want to discontinue  FEN/GI: heart healthy diet PPx: Full dose Xarelto  Disposition: Medically stable for inpatient psych placement  Subjective:  Patient says that she would like to go home soon and would like for her medications to be filled so that she can take them at home.  She does worry that she has some increasing fluid in her legs, but she denies any shortness of breath.  Prays for me at the end of our visit.  Objective: Temp:  [97.7 F (36.5 C)-97.9 F (36.6 C)] 97.7 F (36.5 C) (05/17 0516) Pulse Rate:  [110-113] 110 (05/17 0516) Resp:  [18] 18 (05/17 0516) BP: (110-111)/(89-90) 111/89 (05/17 0516) SpO2:  [98 %-100 %] 100 % (05/17 0516) Weight:  [63.8 kg] 63.8 kg (05/17 0516) Physical Exam: General: NAD, alert, sitting up in bed.  Appears very energetic. Cardiovascular: Mildly tachycardic, regular rhythm.  No M/R/G  Respiratory: CTA B.  No wheezes or crackles noted.  Able to talk in full sentences.  No increased work of breathing. Extremities: 2+ pitting edema to knees Psych: Mood is elevated.  Tangential, pressured speech that becomes unrecognizable at times.  Laboratory: Recent Labs  Lab 11/09/18 1750 11/10/18 1759 11/11/18 1853  WBC 9.9 9.4 10.3  HGB 10.1* 10.0* 9.9*  HCT 30.4* 31.2* 31.2*  PLT 508* 543* 574*   Recent Labs  Lab 11/09/18 0627 11/10/18 1759 11/11/18 1853  NA 126* 132* 133*  K 5.4* 4.6 4.6  CL 87* 96* 94*  CO2 26 27 26   BUN 38* 32* 32*  CREATININE 1.46* 1.38* 1.32*  CALCIUM 8.7* 8.7* 8.9  GLUCOSE 93 126* 109*    Imaging/Diagnostic Tests: None in past 24  hours   Lennox Solders, MD 11/12/2018, 7:48 AM PGY-2, [facility] FPTS Intern pager: [phone], text pages welcome

## 2018-11-13 LAB — BASIC METABOLIC PANEL
Anion gap: 11 (ref 5–15)
BUN: 25 mg/dL — ABNORMAL HIGH (ref 6–20)
CO2: 26 mmol/L (ref 22–32)
Calcium: 8.6 mg/dL — ABNORMAL LOW (ref 8.9–10.3)
Chloride: 96 mmol/L — ABNORMAL LOW (ref 98–111)
Creatinine, Ser: 1.36 mg/dL — ABNORMAL HIGH (ref 0.44–1.00)
GFR calc Af Amer: 56 mL/min — ABNORMAL LOW (ref >60)
GFR calc non Af Amer: 49 mL/min — ABNORMAL LOW (ref >60)
Glucose, Bld: 94 mg/dL (ref 70–99)
Potassium: 4.3 mmol/L (ref 3.5–5.1)
Sodium: 133 mmol/L — ABNORMAL LOW (ref 135–145)

## 2018-11-13 LAB — OCCULT BLOOD X 1 CARD TO LAB, STOOL: Fecal Occult Bld: NEGATIVE

## 2018-11-13 NOTE — Progress Notes (Signed)
Pt. Requesting Mucinex. On call for IM paged to make aware.

## 2018-11-13 NOTE — Consult Note (Signed)
Unable to see patient through telepsych today due to what appears to be technical issues. Will attempt to see again tomorrow.   Christine Beets, DO 11/13/18 5:43 PM

## 2018-11-13 NOTE — Progress Notes (Addendum)
Family Medicine Teaching Service Daily Progress Note Intern Pager: [phone]  Patient name: Christine Cobb Medical record number: [id number] Date of birth: 1975/07/25 Age: 43 y.o. Gender: female  Primary Care Provider: Patient, No Pcp Per Consultants: Cardiology (signed off), CSW, palliative, psychiatry Code Status: DNR  Pt Overview and Major Events to Date:  5/3 Admitted to FPTS, CXR R>L pulmonary effusion, RUL opacity 5/5 CTA with BL pulmonary emboli, patient start on heparin gtt 5/6 Heparin gtt d/c'd, started Xarelto 5/7: Vancomycin started for presumed endocarditis 5/8: Levaquin started for pneumonia 5/13: Psych deemed that patient meets inpatient psych criteria 5/14: Completed Levaquin 5/15: Vancomycin discontinued due to no indication of endocarditis 5/17: Increased Lasix dose to 80 mg daily 5/18: psych reconsulted  Assessment and Plan: Christine Cobb a 42 y.o.femalepresenting with worseningperipheraledema and dyspnea on exertion which has since resolved.  Patient currently in a manic state and will be transferred to inpatient psychiatric treatment. PMH is significant forcurrent every day smoker with history of COPD, HFrEF (EF 20-25%), HTN, Hyperthyroidism, bipolar mood disorder, schizoaffective disorder, and polysubstance abuse.   Bipolar disorder  schizoaffective  MDD  anxiety: Chronic. [facility] requires patient to be re-evaluated by psych. Psych reconsulted. Patient appears more stable on her antipsychotic meds. Keeps requesting more medications or change in medications. Episodes of grandeur - knowing more than her medical doctors, however less pressured speech overall.  No longer on IV antibiotics. Medically stable for inpatient psych/discharge. - Psychiatry re-consulted, will follow up recs - PatientReceived her IV Haldol monthly on 5/7 - Continue Haldol 10 mg nightly - Amantadine 100 mg BID(5/5-) - trazodone 200mg  qhs   Acute on  chronicHFrEF  Poor EF 10-15%: Resolved  Echo 5/7 showing EF 10-15%. Prognosis poor especially in light of her noncompliance and difficult social situation. Less urine output overnight: only 2.3L UOP , net -1.1L.  Continues to have 2+ pitting edema to thigh bilaterally. Wt stable 63.8>63.9>64.8. Per chart review, appears dry wt is ~57kg. -Palliative care consulted, appreciate recs.  - Cardiology signed off - Continue Coreg 3.125 BID (patient continues to refuse this medication) - Increase Lasix to 160mg  QD - Continue to hold HF meds digoxin, spironolactone.  - DNR per cardiology - Strict I's and O's - Daily weights  Small-medium sized bilateral LL PE: Acute. Long-term anticoagulation challenging given her poor compliance and support but we are anticoagulating with xarelto while in the hospital.Patient continues to request Entresto because of bruising with Xarelto. - Continue Xarelto 15 mgBID(5/6-5/26), then 20mg  qdaily.   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 Patient remains afebrile without dyspnea or leukocytosis.. -Continue to monitor off of vancomycin   Lack of medical decision-making capacity: Lacks capacity, per psych. At this time, we are her current medical decision makers. SW is working on getting patient guardian ad litem. -Social work consulted regarding guardian ad litem.  -Pursue IVC if patient attempts to leave AMA  Homelessness No PCP:   - Social work on board, greatly appreciate help - Case management consulted, appreciate recommendations  Hyponatremia, stable, improved 132>133>129>133.  -Daily evening BMP  CKD, stage III: worsening  Cr 1.32>1.04>1.36 today.  Likely 2/2 to diuresis. -Monitor BMP, nighttime labs -Avoid nephrotoxic medications as possible  HTN: Chronic, stable BP normotensive overnight.  Currently on carvedilol. - Monitor BP  COPD: Chronic,  stable. No current symptomatology. - Albuterol as needed  - Discharged with Spiriva   Polysubstance Abuse: Chronic. Current daily smoker, history of cocaine  and MJ use. UDS positive for cocaine on admit. - Nicotine patch 7 mg - Continue to encourage cessation, patient has voiced the want to discontinue  FEN/GI: heart healthy diet PPx: Full dose Xarelto  Disposition: Medically stable for inpatient psych placement  Subjective:  Patient doing well this morning.  Walking around room, fully dressed.  Notes that she would like to be switched to [facility] because of the bruising from Xarelto.  Also would like higher dose of Lasix, she requests 160 mg.  Denies any shortness of breath.  Tolerating p.o.  Objective: Temp:  [97.8 F (36.6 C)-98.6 F (37 C)] 97.8 F (36.6 C) (05/19 1229) Pulse Rate:  [108-125] 108 (05/19 1229) Resp:  [18] 18 (05/19 1229) BP: (114-117)/(88-93) 114/93 (05/19 1229) SpO2:  [99 %-100 %] 100 % (05/19 1229) Weight:  [64.8 kg] 64.8 kg (05/19 0954) Physical Exam: General: Walking around room, well nourished, well developed, in no acute distress with non-toxic appearance HEENT: normocephalic, atraumatic, moist mucous membranes Neck: supple, normal ROM CV: Mildly tachycardic, 2+ pitting edema to thigh bilaterally Lungs: clear to auscultation bilaterally with normal work of breathing, able to speak in full sentences Abdomen: soft, mildly tender, distended, normoactive bowel sounds Skin: warm, dry Extremities: warm and well perfused, normal tone MSK: ROM grossly intact, gait normal Neuro: Speach less pressured however does have granduer thoughts, more redirectable this a.m.  Laboratory: Recent Labs  Lab 11/09/18 1750 11/10/18 1759 11/11/18 1853  WBC 9.9 9.4 10.3  HGB 10.1* 10.0* 9.9*  HCT 30.4* 31.2* 31.2*  PLT 508* 543* 574*   Recent Labs  Lab 11/11/18 1853 11/12/18 1806 11/13/18 1840  NA 133* 129* 133*  K 4.6 4.8 4.3  CL 94* 94* 96*  CO2 26 25 26    BUN 32* 28* 25*  CREATININE 1.32* 1.04* 1.36*  CALCIUM 8.9 8.2* 8.6*  GLUCOSE 109* 157* 94    Imaging/Diagnostic Tests: No results found.  Christine Cobb [place], DO 11/14/2018, 12:41 PM PGY-1, [facility] FPTS Intern pager: [phone], text pages welcome

## 2018-11-13 NOTE — Progress Notes (Signed)
Pt. Requesting Mucinex.Family Medicine paged to make aware.

## 2018-11-13 NOTE — Consult Note (Signed)
Telepsych Consultation   Reason for Consult:  Medication management  Referring Physician:  Dr. Terisa Starr Location of Patient:  [facility] Location of Provider: [facility]   11/13/2018 2:41 PM Christine Cobb  MRN:  [id number]  Subjective:   Christine Cobb was last seen on 5/13 and recommend for inpatient psychiatric hospitalization. Her medications were adjusted (discontinued Abilify in lieu of Haldol) and she was recommended for follow up with outpatient resources. Primary team reports that patients symptoms have worsened. She is tangential in thought process and grandiose. She has been verbally abusive to staff.    On interview, Christine Cobb reports that she is doing well. She reports ongoing problems with sleep. She denies problems with appetite. She denies SI, HI or AVH. She requests adjustments to her medications. She would like to take Haldol twice a day and also requests for her Lasix to be increased. She is aware that her concerns will be relayed to the primary team.    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  Past Medical History:  Past Medical History:  Diagnosis Date  . Arm pain   . Bipolar affective disorder, currently manic, mild (HCC)   . CHF (congestive heart failure) (HCC)   . Eye globe prosthesis   . HTN (hypertension)   . Hyperthyroidism   . Sinus tachycardia     Past Surgical History:  Procedure Laterality Date  . DILATION AND CURETTAGE OF UTERUS    . EYE SURGERY      Family History:  Family History  Problem Relation Age of Onset  . Hypertension Other   . Emphysema Other   . Asthma Son   . Diabetes Maternal Uncle   . Diabetes Paternal Grandmother    Family Psychiatric  History: Denies  Social History:  Social History   Substance and Sexual Activity  Alcohol Use Yes   Comment: Last drink: 1/2 beer PTA     Social History   Substance and Sexual Activity  Drug Use Yes  . Types: Marijuana   Comment: Pt sts "anything and everything"    Social History   Socioeconomic History  . Marital status: Married    Spouse name: Not on file  . Number of children: Not on file  . Years of education: Not on file  . Highest education level: Not on file  Occupational History  . Not on file  Social Needs  . Financial resource strain: Not on file  . Food insecurity:    Worry: Not on file    Inability: Not on file  . Transportation needs:    Medical: Not on file    Non-medical: Not on file  Tobacco Use  . Smoking status: Current Every Day Smoker    Types: Cigarettes    Last attempt to quit: 08/21/2013    Years since quitting: 5.2  . Smokeless tobacco: Never Used  Substance and Sexual Activity  . Alcohol use: Yes    Comment: Last drink: 1/2 beer PTA  . Drug use: Yes    Types: Marijuana    Comment:  Pt sts "anything and everything"  . Sexual activity: Yes    Birth control/protection: Injection    Comment: crack cocaine  Lifestyle  . Physical activity:    Days per week: Not on file    Minutes per session: Not on file  . Stress: Not on file  Relationships  . Social connections:    Talks on phone: Not on file    Gets together: Not on file    Attends religious service: Not on file    Active member of club or organization: Not on file    Attends meetings of clubs or organizations: Not on file    Relationship status: Not on file  Other Topics Concern  . Not on file  Social History Narrative  . Not on file    Sleep: Poor  Appetite:   Good  Current Medications: Current Facility-Administered Medications  Medication Dose Route Frequency Provider Last Rate Last Dose  . acetaminophen (TYLENOL) tablet 650 mg  650 mg Oral Q6H PRN Lennox Solders, MD   650 mg at 11/13/18 1231  . amantadine (SYMMETREL) capsule 100 mg  100 mg Oral BID Lennox Solders, MD   100 mg at 11/13/18 1228  . benzonatate (TESSALON) capsule 200 mg  200 mg Oral TID PRN Lennox Solders, MD   200 mg at 11/09/18 0135  . calcium carbonate (TUMS - dosed in mg elemental calcium) chewable tablet 200 mg of elemental calcium  1 tablet Oral BID PRN Sandre Kitty, MD   200 mg of elemental calcium at 11/12/18 1942  . carvedilol (COREG) tablet 3.125 mg  3.125 mg Oral BID WC Talbert Forest, [place], DO   3.125 mg at 11/08/18 0933  . feeding supplement (ENSURE ENLIVE) (ENSURE ENLIVE) liquid 237 mL  237 mL Oral TID BM Winfrey, Amanda C, MD   237 mL at 11/13/18 1227  . furosemide (LASIX) tablet 80 mg  80 mg Oral Daily Talbert Forest, [place], DO   80 mg at 11/13/18 7672  . guaiFENesin-dextromethorphan (ROBITUSSIN DM) 100-10 MG/5ML syrup 5 mL  5 mL Oral Q4H PRN Westley Chandler, MD   5 mL at 11/13/18 0851  . haloperidol (HALDOL) tablet 10 mg  10 mg Oral QHS Sandre Kitty, MD   10 mg at 11/12/18 2132  . Melatonin TABS 3 mg  3 mg Oral QHS PRN Shirley, [place], DO   3 mg at 11/11/18 2121  . multivitamin with minerals tablet 1 tablet  1 tablet Oral Daily Lennox Solders, MD   1 tablet at 11/13/18 0851  . nicotine (NICODERM CQ - dosed in mg/24 hr) patch 7 mg  7 mg Transdermal Daily Lennox Solders, MD   7 mg at 11/13/18 0855  . polyethylene glycol (MIRALAX / GLYCOLAX) packet 17 g  17 g Oral BID Mullis, Kiersten P, DO   17 g at 11/13/18 0852  . Rivaroxaban (XARELTO) tablet 15 mg  15 mg Oral BID WC Lennox Solders, MD   15 mg at 11/13/18 0704  . traZODone (DESYREL) tablet 200 mg  200 mg Oral QHS Lennox Solders, MD   200 mg at 11/12/18 2132    Lab Results:  Results for orders placed or  performed during the hospital encounter of 10/29/18 (from the past 48 hour(s))  CBC     Status: Abnormal   Collection Time: 11/11/18  6:53 PM  Result Value Ref Range   WBC 10.3 4.0 - 10.5 K/uL   RBC 3.97 3.87 - 5.11  MIL/uL   Hemoglobin 9.9 (L) 12.0 - 15.0 g/dL   HCT 33.5 (L) 82.5 - 18.9 %   MCV 78.6 (L) 80.0 - 100.0 fL   MCH 24.9 (L) 26.0 - 34.0 pg   MCHC 31.7 30.0 - 36.0 g/dL   RDW 84.2 (H) 10.3 - 12.8 %   Platelets 574 (H) 150 - 400 K/uL   nRBC 0.5 (H) 0.0 - 0.2 %    Comment: Performed at [facility], [address] [place], [place] [address]  Basic metabolic panel     Status: Abnormal   Collection Time: 11/11/18  6:53 PM  Result Value Ref Range   Sodium 133 (L) 135 - 145 mmol/L   Potassium 4.6 3.5 - 5.1 mmol/L   Chloride 94 (L) 98 - 111 mmol/L   CO2 26 22 - 32 mmol/L   Glucose, Bld 109 (H) 70 - 99 mg/dL   BUN 32 (H) 6 - 20 mg/dL   Creatinine, Ser 7.73 (H) 0.44 - 1.00 mg/dL   Calcium 8.9 8.9 - 73.6 mg/dL   GFR calc non Af Amer 50 (L) >60 mL/min   GFR calc Af Amer 58 (L) >60 mL/min   Anion gap 13 5 - 15    Comment: Performed at [facility], [address] [place], [place] [address]  Basic metabolic panel     Status: Abnormal   Collection Time: 11/12/18  6:06 PM  Result Value Ref Range   Sodium 129 (L) 135 - 145 mmol/L   Potassium 4.8 3.5 - 5.1 mmol/L   Chloride 94 (L) 98 - 111 mmol/L   CO2 25 22 - 32 mmol/L   Glucose, Bld 157 (H) 70 - 99 mg/dL   BUN 28 (H) 6 - 20 mg/dL   Creatinine, Ser 4.70 (H) 0.44 - 1.00 mg/dL   Calcium 8.2 (L) 8.9 - 10.3 mg/dL   GFR calc non Af Amer >60 >60 mL/min   GFR calc Af Amer >60 >60 mL/min   Anion gap 10 5 - 15    Comment: Performed at [facility], [address], [place], [place] [address]  Occult blood card to lab, stool     Status: None   Collection Time: 11/13/18  3:18 AM  Result Value Ref Range   Fecal Occult Bld NEGATIVE NEGATIVE    Comment: Performed at [facility], [address], [place], [place]  [address]    Blood Alcohol level:  Lab Results  Component Value Date   [facility] <10 10/05/2018   ETH <10 08/23/2018     Musculoskeletal: Strength & Muscle Tone: No atrophy noted. Gait & Station: normal Patient leans: N/A  Psychiatric Specialty Exam: Physical Exam  Nursing note and vitals reviewed. Constitutional: She is oriented to person, place, and time. She appears well-developed and well-nourished.  HENT:  Head: Normocephalic and atraumatic.  Neck: Normal range of motion.  Respiratory: Effort normal.  Musculoskeletal: Normal range of motion.  Neurological: She is alert and oriented to person, place, and time.  Psychiatric: She has a normal mood and affect. Her speech is normal and behavior is normal. Judgment and thought content normal. Cognition and memory are normal.    Review of Systems  Gastrointestinal: Positive for constipation. Negative for diarrhea, nausea and vomiting.  Psychiatric/Behavioral: Positive for substance abuse. Negative for hallucinations and suicidal ideas. The patient has insomnia.   All other systems reviewed and are negative.   Blood pressure 111/88, pulse (!) 107, temperature  98.5 F (36.9 C), temperature source Oral, resp. rate 16, height  (1.626 m), weight 63.9 kg, last menstrual period 09/13/2018, SpO2 100 %.Body mass index is 24.17 kg/m.  General Appearance: Fairly Groomed, middle aged, [place] American female, wearing casual clothes with hair twists, left ocular prosthesis and missing front teeth who is lying in bed. NAD.   Eye Contact:  Good  Speech:  Clear and Coherent  Volume:  Normal  Mood:  Euthymic  Affect:  Appropriate and Congruent   Thought Process:  Goal Directed, Linear and Descriptions of Associations: Intact  Orientation:  Full (Time, Place, and Person)  Thought Content:  Logical  Suicidal Thoughts:  No  Homicidal Thoughts:  No  Memory:  Immediate;   Good Recent;   Good Remote;   Good  Judgement:  Fair  Insight:  Fair   Psychomotor Activity:  Normal  Concentration:  Concentration: Good and Attention Span: Good  Recall:  Good  Fund of Knowledge:  Good  Language:  Good  Akathisia:  No  Handed:  Right  AIMS (if indicated):   N/A  Assets:  Desire for Improvement Resilience  ADL's:  Intact  Cognition:  WNL   Sleep:   Poor    Assessment:  Maheen Harlym Gehling is a 43 y.o. female who was admitted with CHF exacerbation due to poor medication compliance. Patient is organized in thought process and does not endorse paranoid or delusional beliefs. She is appropriate and calm in behavior. She denies SI, HI or AVH. She has responded well to medication management and no longer warrants inpatient psychiatric hospitalization. She should follow up with her outpatient provider for further medication management.    Treatment Plan Summary: -Continue Haldol 10 mg qhs for mood stabilization. Can switch to 5 mg BID per patient request to receive a morning dose in addition to evening dose.  -Continue Amantadine 100 mg BID for EPS prophylaxis.  -Continue monthly Haldol decanoate 100 mg injection (last given on 5/7).  -Continue Melatonin 3 mg qhs PRN for insomnia. -Increase Trazodone 200 mg qhs for insomnia.  -EKG reviewed and QTc477 on 5/5. Please closely monitor when starting or increasing QTc prolonging agents. -Patient should follow up with her outpatient provider for medication management.  -Will sign off on patient at this time. Please consult psychiatry again as needed.   This service was provided via telemedicine using a 2-way, interactive audio and video technology.  Names of all persons participating in this telemedicine service and their role in this encounter. Name: Juanetta Beets, DO Role: Psychiatrist   Name: Christine Cobb Role: Patient      Cherly Beach, DO 11/14/2018, 12:01 PM

## 2018-11-13 NOTE — TOC Progression Note (Addendum)
Transition of Care [facility]) - Progression Note    Patient Details  Name: Christine Cobb MRN: [id number] Date of Birth: [birth year]  Transition of Care [facility]) CM/SW Contact  Margarito Liner, LCSW Phone Number: 11/13/2018, 10:04 AM  Clinical Narrative: Per handoff, weekend [facility] admissions coordinator told weekend CSW that "She does not think that any psych facility will take patient due to her medical complexities but she stated continue to follow up with her and with [facility]. They think she should maybe go to an [facility]." CSW called and spoke with Inetta Fermo at [facility]. She will follow up on this and let CSW know result. Also made referral to [facility].  11:47 am: [facility] is not willing to consider patient until psych sees her again. Last note was 5/13. Sent MD message asking her to reconsult.  Expected Discharge Plan: Homeless Shelter Barriers to Discharge: Continued Medical Work up  Expected Discharge Plan and Services Expected Discharge Plan: Homeless Shelter   Discharge Planning Services: CM Consult Post Acute Care Choice: NA Living arrangements for the past 2 months: Homeless Shelter                   DME Agency: NA       HH Arranged: NA HH Agency: NA         Social Determinants of Health (SDOH) Interventions    Readmission Risk Interventions No flowsheet data found.

## 2018-11-14 LAB — BASIC METABOLIC PANEL
Anion gap: 11 (ref 5–15)
BUN: 24 mg/dL — ABNORMAL HIGH (ref 6–20)
CO2: 27 mmol/L (ref 22–32)
Calcium: 8.7 mg/dL — ABNORMAL LOW (ref 8.9–10.3)
Chloride: 95 mmol/L — ABNORMAL LOW (ref 98–111)
Creatinine, Ser: 1.33 mg/dL — ABNORMAL HIGH (ref 0.44–1.00)
GFR calc Af Amer: 58 mL/min — ABNORMAL LOW (ref >60)
GFR calc non Af Amer: 50 mL/min — ABNORMAL LOW (ref >60)
Glucose, Bld: 116 mg/dL — ABNORMAL HIGH (ref 70–99)
Potassium: 4.5 mmol/L (ref 3.5–5.1)
Sodium: 133 mmol/L — ABNORMAL LOW (ref 135–145)

## 2018-11-14 MED ORDER — FUROSEMIDE 80 MG PO TABS
160.0000 mg | ORAL_TABLET | Freq: Every day | ORAL | Status: DC
  Administered 2018-11-15: 160 mg via ORAL
  Filled 2018-11-14: qty 2

## 2018-11-14 MED ORDER — FUROSEMIDE 80 MG PO TABS: 160.0000 mg | ORAL_TABLET | Freq: Two times a day (BID) | ORAL | Status: DC

## 2018-11-14 MED ORDER — FUROSEMIDE 80 MG PO TABS: 80.0000 mg | ORAL_TABLET | Freq: Two times a day (BID) | ORAL | Status: DC

## 2018-11-14 MED ORDER — FUROSEMIDE 80 MG PO TABS: 160.0000 mg | ORAL_TABLET | Freq: Once | ORAL | Status: DC

## 2018-11-14 MED ORDER — FUROSEMIDE 80 MG PO TABS
160.0000 mg | ORAL_TABLET | Freq: Once | ORAL | Status: AC
  Administered 2018-11-14: 160 mg via ORAL
  Filled 2018-11-14: qty 2

## 2018-11-14 NOTE — Progress Notes (Signed)
Nutrition Follow-up  RD working remotely.  DOCUMENTATION CODES:   Not applicable  INTERVENTION:   -Continue Ensure Enlive po TID, each supplement provides 350 kcal and 20 grams of protein -Continue MVI with minerals daily  NUTRITION DIAGNOSIS:   Predicted suboptimal nutrient intake related to social / environmental circumstances as evidenced by per patient/family report.  Ongoing  GOAL:   Patient will meet greater than or equal to 90% of their needs  Progressing  MONITOR:   PO intake, Supplement acceptance, Labs, Weight trends, Skin, I & O's  REASON FOR ASSESSMENT:   Malnutrition Screening Tool    ASSESSMENT:   Christine Cobb is a 43 y.o. female presenting with worsening peripheral edema and dyspnea on exertion. PMH is significant for current every day smoker with history of COPD, HFrEF (EF 20-25%), HTN, Hyperthyroidism, bipolar mood disorder, schizoaffective disorder, and polysubstance abuse.   5/15- IV antibiotics stopped, IVC paperwork completed  Reviewed I/O's: -1.2 L x 24 hours and -293 ml since 10/31/18  UOP: 2.4 L x 24 hours  Pt continues to refuse aspects of care; pulling out IVs continuously and refusing medications. Per MD notes, pt is medically stable for inpatient psychiatric hospital admission and is awaiting placement.   Pt remains with good appetite; noted meal completion 50-100%. She is receiving Ensure supplements per her request, which she is consuming.   Labs reviewed: Na: 129.   Diet Order:   Diet Order            Diet Heart Room service appropriate? Yes; Fluid consistency: Thin; Fluid restriction: 1500 mL Fluid  Diet effective now              EDUCATION NEEDS:   No education needs have been identified at this time  Skin:  Skin Assessment: Reviewed RN Assessment  Last BM:  11/14/18  Height:   Ht Readings from Last 1 Encounters:  10/29/18 5\' 4"  (1.626 m)    Weight:   Wt Readings from Last 1 Encounters:  11/14/18 64.8  kg    Ideal Body Weight:  54.5 kg  BMI:  Body mass index is 24.52 kg/m.  Estimated Nutritional Needs:   Kcal:  1700-1900  Protein:  90-105 grams  Fluid:  1.7-1.9 L    Deaja Rizo A. Mayford Knife, RD, LDN, CDCES Registered Dietitian II Certified Diabetes Care and Education Specialist Pager: [phone] After hours Pager: [phone]

## 2018-11-15 MED ORDER — NICOTINE 7 MG/24HR TD PT24: 7.0000 mg | MEDICATED_PATCH | Freq: Every day | TRANSDERMAL | 0 refills | Status: AC

## 2018-11-15 MED ORDER — ACETAMINOPHEN 325 MG PO TABS: 650.0000 mg | ORAL_TABLET | Freq: Four times a day (QID) | ORAL | Status: AC | PRN

## 2018-11-15 MED ORDER — TIOTROPIUM BROMIDE MONOHYDRATE 18 MCG IN CAPS: 18.0000 ug | ORAL_CAPSULE | Freq: Every morning | RESPIRATORY_TRACT | 0 refills | Status: AC

## 2018-11-15 MED ORDER — TRAZODONE HCL 100 MG PO TABS: 200.0000 mg | ORAL_TABLET | Freq: Every day | ORAL | 0 refills | Status: AC

## 2018-11-15 MED ORDER — ADULT MULTIVITAMIN W/MINERALS CH: 1.0000 | ORAL_TABLET | Freq: Every day | ORAL | Status: AC

## 2018-11-15 MED ORDER — MELATONIN 3 MG PO TABS: 3.0000 mg | ORAL_TABLET | Freq: Every evening | ORAL | 0 refills | Status: AC | PRN

## 2018-11-15 MED ORDER — ENSURE ENLIVE PO LIQD: 237.0000 mL | Freq: Three times a day (TID) | ORAL | 12 refills | Status: AC

## 2018-11-15 MED ORDER — RIVAROXABAN 20 MG PO TABS: 20.0000 mg | ORAL_TABLET | Freq: Every day | ORAL | 0 refills | Status: AC

## 2018-11-15 MED ORDER — RIVAROXABAN 15 MG PO TABS: 15.0000 mg | ORAL_TABLET | Freq: Two times a day (BID) | ORAL | 0 refills | Status: AC

## 2018-11-15 MED ORDER — CARVEDILOL 3.125 MG PO TABS: 3.1250 mg | ORAL_TABLET | Freq: Two times a day (BID) | ORAL | 0 refills | Status: AC

## 2018-11-15 MED ORDER — RIVAROXABAN 20 MG PO TABS: 20.0000 mg | ORAL_TABLET | Freq: Every day | ORAL | Status: DC

## 2018-11-15 MED ORDER — CALCIUM CARBONATE ANTACID 500 MG PO CHEW: 1.0000 | CHEWABLE_TABLET | Freq: Two times a day (BID) | ORAL | Status: AC | PRN

## 2018-11-15 MED ORDER — HALOPERIDOL 10 MG PO TABS: 10.0000 mg | ORAL_TABLET | Freq: Every day | ORAL | 0 refills | Status: AC

## 2018-11-15 MED ORDER — POLYETHYLENE GLYCOL 3350 17 G PO PACK: 17.0000 g | PACK | Freq: Two times a day (BID) | ORAL | 0 refills | Status: AC

## 2018-11-15 MED ORDER — AMANTADINE HCL 100 MG PO CAPS: 100.0000 mg | ORAL_CAPSULE | Freq: Two times a day (BID) | ORAL | 0 refills | Status: AC

## 2018-11-15 MED ORDER — FUROSEMIDE 80 MG PO TABS: 160.0000 mg | ORAL_TABLET | Freq: Every day | ORAL | 0 refills | Status: AC

## 2018-11-15 MED ORDER — GUAIFENESIN-DM 100-10 MG/5ML PO SYRP: 5.0000 mL | ORAL_SOLUTION | ORAL | 0 refills | Status: AC | PRN

## 2018-11-15 MED FILL — XARELTO 15 MG TABLET: 15 | 6 days supply | Qty: 12 | Fill #0

## 2018-11-15 MED FILL — FUROSEMIDE 80 MG TAB: 80 | 30 days supply | Qty: 60 | Fill #0

## 2018-11-15 MED FILL — SPIRIVA 18 MCG CP-HANDIHALE: 18 | 30 days supply | Qty: 30 | Fill #0

## 2018-11-15 MED FILL — XARELTO 20 MG TABLET: 20 | 30 days supply | Qty: 30 | Fill #0

## 2018-11-15 MED FILL — HALOPERIDOL 5 MG TABLET: 5 | 30 days supply | Qty: 60 | Fill #0

## 2018-11-15 MED FILL — CARVEDILOL 3.125 MG TABLET: 3.125 | 60 days supply | Qty: 60 | Fill #0

## 2018-11-15 NOTE — Plan of Care (Signed)
  Problem: Education: Goal: Ability to verbalize understanding of medication therapies will improve Outcome: Progressing   Problem: Education: Goal: Individualized Educational Video(s) Outcome: Progressing   

## 2018-11-15 NOTE — TOC Transition Note (Addendum)
Transition of Care [facility]) - CM/SW Discharge Note   Patient Details  Name: Christine Cobb MRN: [id number] Date of Birth: 05/20/1976  Transition of Care [facility]) CM/SW Contact:  Margarito Liner, LCSW Phone Number: 11/15/2018, 12:09 PM   Clinical Narrative: Patient aware of plan for discharge today. MD signed Notice of Commitment Change and CSW faxed to [facility] to rescind IVC. Provided patient with list of local outpatient therapy options. Highlighted ones that she would qualify for. Encouraged her to call around to see who has openings and to discuss their current COVID restrictions. Patient initially stated her sister would pick her up but then tech stated she needed a bus pass. Nurse secretary has bus passes to give her. Patient stated she plans to move to [place] soon to be with her family. CSW signing off.   2:51 pm: RNCM managed getting medications to [facility] pharmacy. Care management dept covered copay and patient's six discharge meds have been given to her. CSW signing off.  Final next level of care: Homeless Shelter Barriers to Discharge: Barriers Resolved   Patient Goals and CMS Choice Patient states their goals for this hospitalization and ongoing recovery are:: Too lethargic to answer CMS Medicare.gov Compare Post Acute Care list provided to:: Patient Choice offered to / list presented to : NA  Discharge Placement                    Patient and family notified of of transfer: 11/15/18  Discharge Plan and Services   Discharge Planning Services: CM Consult Post Acute Care Choice: NA            DME Agency: NA       HH Arranged: NA HH Agency: NA        Social Determinants of Health (SDOH) Interventions     Readmission Risk Interventions No flowsheet data found.

## 2018-11-15 NOTE — Care Management Important Message (Signed)
Important Message  Patient Details  Name: Christine Cobb MRN: [id number] Date of Birth: 06/09/1976   Medicare Important Message Given:  Yes    [name] 11/15/2018, 8:45 AM

## 2018-11-15 NOTE — Discharge Instructions (Signed)
You were hospitalized at [facility] for an exacerbation of your heart failure.  This was believed to be due to to not taking her medicines.  You were restarted on your Lasix with much improvement in your swelling.  You were also found to have a blood clot in your lung.  You were started on a medicine called Xarelto to help treat and prevent this from occurring again.  It will be very, very important for you to take these medicines every day as prescribed.  Be sure to make an appointment with your family doctor as soon as possible for hospital follow-up and refills.  You are instructed to take your Xarelto 15mg  twice a day until 11/21/2018, and then you will begin to take Xarelto 20 mg once a day.  You were also on your psychiatry medications with much improvement.  It will also be very important for you to continue taking these every day as prescribed and to follow-up with psychiatry as an outpatient.  You so much for allowing [place] to take care of you!   Heart Failure  Heart failure means your heart has trouble pumping blood. This makes it hard for your body to work well. Heart failure is usually a long-term (chronic) condition. You must take good care of yourself and follow your treatment plan from your doctor. Follow these instructions at home: Medicines  Take over-the-counter and prescription medicines only as told by your doctor. ? Do not stop taking your medicine unless your doctor told you to do that. ? Do not skip any doses. ? Refill your prescriptions before you run out of medicine. You need your medicines every day. Eating and drinking   Eat heart-healthy foods. Talk with a diet and nutrition specialist (dietitian) to make an eating plan.  Choose foods that: ? Have no trans fat. ? Are low in saturated fat and cholesterol.  Choose healthy foods, like: ? Fresh or frozen fruits and vegetables. ? Fish. ? Low-fat (lean) meats. ? Legumes (like beans, peas, and lentils). ? Fat-free or  low-fat dairy products. ? Whole-grain foods. ? High-fiber foods.  Limit salt (sodium) if told by your doctor. Ask your nutrition specialist to recommend heart-healthy seasonings.  Cook in healthy ways instead of frying. Healthy ways of cooking include: ? Roasting. ? Grilling. ? Broiling. ? Baking. ? Poaching. ? Steaming. ? Stir-frying.  Limit how much fluid you drink, if told by your doctor. Lifestyle  Do not smoke or use chewing tobacco. Do not use nicotine gum or patches before talking to your doctor.  Limit alcohol intake to no more than 1 drink a day for non-pregnant women and 2 drinks a day for men. One drink equals 12 oz of beer, 5 oz of wine, or 1 oz of hard liquor. ? Tell your doctor if you drink alcohol many times a week. ? Talk with your doctor about whether any alcohol is safe for you. ? You should stop drinking alcohol: ? If your heart has been damaged by alcohol. ? You have very bad heart failure.  Do not use illegal drugs.  Lose weight if told by your doctor.  Do moderate physical activity if told by your doctor. Ask your doctor what activities are safe for you if: ? You are of older age (elderly). ? You have very bad heart failure. Keep track of important information  Weigh yourself every day. ? Weigh yourself every morning after you pee (urinate) and before breakfast. ? Wear the same amount of clothing  each time. ? Write down your daily weight. Give your record to your doctor.  Check and write down your blood pressure as told by your doctor.  Check your pulse as told by your doctor. Dealing with heat and cold  If the weather is very hot: ? Avoid activity that takes a lot of energy. ? Use air conditioning or fans, or find a cooler place. ? Avoid caffeine. ? Avoid alcohol. ? Wear clothing that is loose-fitting, lightweight, and light-colored.  If the weather is very cold: ? Avoid activity that takes a lot of energy. ? Layer your clothes. ? Wear  mittens or gloves, a hat, and a scarf when you go outside. ? Avoid alcohol. General instructions  Manage other conditions that you have as told by your doctor.  Learn to manage stress. If you need help, ask your doctor.  Plan rest periods for when you get tired.  Get education and support as needed.  Get rehab (rehabilitation) to help you stay independent and to help with everyday tasks.  Stay up to date with shots (immunizations), especially pneumococcal and flu (influenza) shots.  Keep all follow-up visits as told by your doctor. This is important. Contact a doctor if:  You gain weight quickly.  You are more short of breath than normal.  You cannot do your normal activities.  You tire easily.  You cough more than normal, especially with activity.  You have any or more puffiness (swelling) in areas such as your hands, feet, ankles, or belly (abdomen).  You cannot sleep because it is hard to breathe.  You feel like your heart is beating fast (palpitations).  You get dizzy or light-headed when you stand up. Get help right away if:  You have trouble breathing.  You or someone else notices a change in your awareness. This could be trouble staying awake or trouble concentrating.  You have chest pain or discomfort.  You pass out (faint). Summary  Heart failure means your heart has trouble pumping blood.  Make sure you refill your prescriptions before you run out of medicine. You need your medicines every day.  Keep records of your weight and blood pressure to give to your doctor.  Contact a doctor if you gain weight quickly. This information is not intended to replace advice given to you by your health care provider. Make sure you discuss any questions you have with your health care provider. Document Released: 03/23/2008 Document Revised: 03/08/2018 Document Reviewed: 07/06/2016 Elsevier Interactive Patient Education  2019 [employer].
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
## 2018-11-24 ENCOUNTER — Inpatient Hospital Stay: Payer: Medicare HMO | Admitting: Primary Care

## 2018-11-25 ENCOUNTER — Inpatient Hospital Stay (HOSPITAL_COMMUNITY)
Admission: EM | Admit: 2018-11-25 | Discharge: 2018-11-27 | DRG: 308 | Disposition: E | Payer: Medicare HMO | Attending: Specialist | Admitting: Specialist

## 2018-11-25 DIAGNOSIS — N183 Chronic kidney disease, stage 3 (moderate): Secondary | ICD-10-CM | POA: Diagnosis present

## 2018-11-25 DIAGNOSIS — J9690 Respiratory failure, unspecified, unspecified whether with hypoxia or hypercapnia: Secondary | ICD-10-CM | POA: Diagnosis present

## 2018-11-25 DIAGNOSIS — F250 Schizoaffective disorder, bipolar type: Secondary | ICD-10-CM | POA: Diagnosis present

## 2018-11-25 DIAGNOSIS — J9601 Acute respiratory failure with hypoxia: Secondary | ICD-10-CM | POA: Diagnosis present

## 2018-11-25 DIAGNOSIS — Z8249 Family history of ischemic heart disease and other diseases of the circulatory system: Secondary | ICD-10-CM

## 2018-11-25 DIAGNOSIS — I2699 Other pulmonary embolism without acute cor pulmonale: Secondary | ICD-10-CM | POA: Diagnosis present

## 2018-11-25 DIAGNOSIS — F1494 Cocaine use, unspecified with cocaine-induced mood disorder: Secondary | ICD-10-CM | POA: Diagnosis present

## 2018-11-25 DIAGNOSIS — Z8679 Personal history of other diseases of the circulatory system: Secondary | ICD-10-CM

## 2018-11-25 DIAGNOSIS — I509 Heart failure, unspecified: Secondary | ICD-10-CM

## 2018-11-25 DIAGNOSIS — Z515 Encounter for palliative care: Secondary | ICD-10-CM | POA: Diagnosis present

## 2018-11-25 DIAGNOSIS — I4901 Ventricular fibrillation: Secondary | ICD-10-CM | POA: Diagnosis not present

## 2018-11-25 DIAGNOSIS — I469 Cardiac arrest, cause unspecified: Secondary | ICD-10-CM | POA: Diagnosis present

## 2018-11-25 DIAGNOSIS — E872 Acidosis: Secondary | ICD-10-CM | POA: Diagnosis present

## 2018-11-25 DIAGNOSIS — F17210 Nicotine dependence, cigarettes, uncomplicated: Secondary | ICD-10-CM | POA: Diagnosis present

## 2018-11-25 DIAGNOSIS — I130 Hypertensive heart and chronic kidney disease with heart failure and stage 1 through stage 4 chronic kidney disease, or unspecified chronic kidney disease: Secondary | ICD-10-CM | POA: Diagnosis present

## 2018-11-25 DIAGNOSIS — Z8701 Personal history of pneumonia (recurrent): Secondary | ICD-10-CM

## 2018-11-25 DIAGNOSIS — R402142 Coma scale, eyes open, spontaneous, at arrival to emergency department: Secondary | ICD-10-CM | POA: Diagnosis present

## 2018-11-25 DIAGNOSIS — I5023 Acute on chronic systolic (congestive) heart failure: Secondary | ICD-10-CM | POA: Diagnosis present

## 2018-11-25 DIAGNOSIS — I472 Ventricular tachycardia: Secondary | ICD-10-CM | POA: Diagnosis present

## 2018-11-25 DIAGNOSIS — Z66 Do not resuscitate: Secondary | ICD-10-CM | POA: Diagnosis present

## 2018-11-25 DIAGNOSIS — Z970 Presence of artificial eye: Secondary | ICD-10-CM

## 2018-11-25 DIAGNOSIS — I471 Supraventricular tachycardia: Secondary | ICD-10-CM | POA: Diagnosis present

## 2018-11-25 DIAGNOSIS — N179 Acute kidney failure, unspecified: Secondary | ICD-10-CM | POA: Diagnosis present

## 2018-11-25 DIAGNOSIS — F1210 Cannabis abuse, uncomplicated: Secondary | ICD-10-CM | POA: Diagnosis present

## 2018-11-25 DIAGNOSIS — I5084 End stage heart failure: Secondary | ICD-10-CM | POA: Diagnosis present

## 2018-11-25 DIAGNOSIS — R34 Anuria and oliguria: Secondary | ICD-10-CM | POA: Diagnosis present

## 2018-11-25 DIAGNOSIS — I10 Essential (primary) hypertension: Secondary | ICD-10-CM | POA: Diagnosis present

## 2018-11-25 DIAGNOSIS — R402362 Coma scale, best motor response, obeys commands, at arrival to emergency department: Secondary | ICD-10-CM | POA: Diagnosis present

## 2018-11-25 DIAGNOSIS — F3113 Bipolar disorder, current episode manic without psychotic features, severe: Secondary | ICD-10-CM | POA: Diagnosis present

## 2018-11-25 DIAGNOSIS — Z888 Allergy status to other drugs, medicaments and biological substances status: Secondary | ICD-10-CM

## 2018-11-25 DIAGNOSIS — I420 Dilated cardiomyopathy: Secondary | ICD-10-CM | POA: Diagnosis present

## 2018-11-25 DIAGNOSIS — Z20828 Contact with and (suspected) exposure to other viral communicable diseases: Secondary | ICD-10-CM | POA: Diagnosis present

## 2018-11-25 DIAGNOSIS — Z9114 Patient's other noncompliance with medication regimen: Secondary | ICD-10-CM

## 2018-11-25 DIAGNOSIS — Z825 Family history of asthma and other chronic lower respiratory diseases: Secondary | ICD-10-CM

## 2018-11-25 DIAGNOSIS — F1414 Cocaine abuse with cocaine-induced mood disorder: Secondary | ICD-10-CM | POA: Diagnosis present

## 2018-11-25 DIAGNOSIS — Z590 Homelessness: Secondary | ICD-10-CM

## 2018-11-25 DIAGNOSIS — Z833 Family history of diabetes mellitus: Secondary | ICD-10-CM

## 2018-11-25 DIAGNOSIS — Z880 Allergy status to penicillin: Secondary | ICD-10-CM

## 2018-11-25 DIAGNOSIS — R570 Cardiogenic shock: Secondary | ICD-10-CM | POA: Diagnosis present

## 2018-11-25 DIAGNOSIS — R402252 Coma scale, best verbal response, oriented, at arrival to emergency department: Secondary | ICD-10-CM | POA: Diagnosis present

## 2018-11-25 DIAGNOSIS — Z79899 Other long term (current) drug therapy: Secondary | ICD-10-CM

## 2018-11-25 NOTE — ED Triage Notes (Signed)
Pt here via [facility] from home due to SOB. On arrival pt HR in the 200's with c/o chest pain. Pt has not been taking meds for the past two weeks.

## 2018-11-26 ENCOUNTER — Other Ambulatory Visit: Payer: Self-pay

## 2018-11-26 ENCOUNTER — Encounter (HOSPITAL_COMMUNITY): Payer: Self-pay | Admitting: Radiology

## 2018-11-26 ENCOUNTER — Emergency Department (HOSPITAL_COMMUNITY): Payer: Medicare HMO

## 2018-11-26 ENCOUNTER — Inpatient Hospital Stay (HOSPITAL_COMMUNITY): Payer: Medicare HMO

## 2018-11-26 DIAGNOSIS — R570 Cardiogenic shock: Secondary | ICD-10-CM

## 2018-11-26 DIAGNOSIS — F250 Schizoaffective disorder, bipolar type: Secondary | ICD-10-CM | POA: Diagnosis present

## 2018-11-26 DIAGNOSIS — I420 Dilated cardiomyopathy: Secondary | ICD-10-CM | POA: Diagnosis present

## 2018-11-26 DIAGNOSIS — F1210 Cannabis abuse, uncomplicated: Secondary | ICD-10-CM | POA: Diagnosis present

## 2018-11-26 DIAGNOSIS — G931 Anoxic brain damage, not elsewhere classified: Secondary | ICD-10-CM

## 2018-11-26 DIAGNOSIS — E872 Acidosis: Secondary | ICD-10-CM

## 2018-11-26 DIAGNOSIS — N183 Chronic kidney disease, stage 3 (moderate): Secondary | ICD-10-CM | POA: Diagnosis present

## 2018-11-26 DIAGNOSIS — F3113 Bipolar disorder, current episode manic without psychotic features, severe: Secondary | ICD-10-CM | POA: Diagnosis present

## 2018-11-26 DIAGNOSIS — Z9911 Dependence on respirator [ventilator] status: Secondary | ICD-10-CM

## 2018-11-26 DIAGNOSIS — R34 Anuria and oliguria: Secondary | ICD-10-CM | POA: Diagnosis present

## 2018-11-26 DIAGNOSIS — I469 Cardiac arrest, cause unspecified: Secondary | ICD-10-CM | POA: Diagnosis present

## 2018-11-26 DIAGNOSIS — I130 Hypertensive heart and chronic kidney disease with heart failure and stage 1 through stage 4 chronic kidney disease, or unspecified chronic kidney disease: Secondary | ICD-10-CM | POA: Diagnosis present

## 2018-11-26 DIAGNOSIS — I472 Ventricular tachycardia: Secondary | ICD-10-CM | POA: Diagnosis present

## 2018-11-26 DIAGNOSIS — J9601 Acute respiratory failure with hypoxia: Secondary | ICD-10-CM

## 2018-11-26 DIAGNOSIS — I5084 End stage heart failure: Secondary | ICD-10-CM | POA: Diagnosis present

## 2018-11-26 DIAGNOSIS — I471 Supraventricular tachycardia: Secondary | ICD-10-CM | POA: Diagnosis present

## 2018-11-26 DIAGNOSIS — Z20828 Contact with and (suspected) exposure to other viral communicable diseases: Secondary | ICD-10-CM | POA: Diagnosis present

## 2018-11-26 DIAGNOSIS — I5023 Acute on chronic systolic (congestive) heart failure: Secondary | ICD-10-CM

## 2018-11-26 DIAGNOSIS — F17210 Nicotine dependence, cigarettes, uncomplicated: Secondary | ICD-10-CM | POA: Diagnosis present

## 2018-11-26 DIAGNOSIS — I2699 Other pulmonary embolism without acute cor pulmonale: Secondary | ICD-10-CM | POA: Diagnosis present

## 2018-11-26 DIAGNOSIS — Z970 Presence of artificial eye: Secondary | ICD-10-CM | POA: Diagnosis not present

## 2018-11-26 DIAGNOSIS — I4901 Ventricular fibrillation: Secondary | ICD-10-CM | POA: Diagnosis present

## 2018-11-26 DIAGNOSIS — N179 Acute kidney failure, unspecified: Secondary | ICD-10-CM | POA: Diagnosis present

## 2018-11-26 DIAGNOSIS — F1414 Cocaine abuse with cocaine-induced mood disorder: Secondary | ICD-10-CM | POA: Diagnosis present

## 2018-11-26 DIAGNOSIS — Z66 Do not resuscitate: Secondary | ICD-10-CM | POA: Diagnosis present

## 2018-11-26 DIAGNOSIS — Z515 Encounter for palliative care: Secondary | ICD-10-CM | POA: Diagnosis present

## 2018-11-26 DIAGNOSIS — Z8701 Personal history of pneumonia (recurrent): Secondary | ICD-10-CM | POA: Diagnosis not present

## 2018-11-26 LAB — SARS CORONAVIRUS 2 BY RT PCR (HOSPITAL ORDER, PERFORMED IN ~~LOC~~ HOSPITAL LAB): SARS Coronavirus 2: NEGATIVE

## 2018-11-26 LAB — POCT I-STAT 7, (LYTES, BLD GAS, ICA,H+H)
Acid-base deficit: 22 mmol/L — ABNORMAL HIGH (ref 0.0–2.0)
Acid-base deficit: 15 mmol/L — ABNORMAL HIGH (ref 0.0–2.0)
Bicarbonate: 9.2 mmol/L — ABNORMAL LOW (ref 20.0–28.0)
Bicarbonate: 12.8 mmol/L — ABNORMAL LOW (ref 20.0–28.0)
Calcium, Ion: 1.06 mmol/L — ABNORMAL LOW (ref 1.15–1.40)
Calcium, Ion: 0.85 mmol/L — CL (ref 1.15–1.40)
HCT: 38 % (ref 36.0–46.0)
HCT: 38 % (ref 36.0–46.0)
Hemoglobin: 12.9 g/dL (ref 12.0–15.0)
Hemoglobin: 12.9 g/dL (ref 12.0–15.0)
O2 Saturation: 86 %
O2 Saturation: 76 %
Patient temperature: 98.7
Potassium: 4.7 mmol/L (ref 3.5–5.1)
Potassium: 3.7 mmol/L (ref 3.5–5.1)
Sodium: 138 mmol/L (ref 135–145)
Sodium: 144 mmol/L (ref 135–145)
TCO2: 10 mmol/L — ABNORMAL LOW (ref 22–32)
TCO2: 14 mmol/L — ABNORMAL LOW (ref 22–32)
pCO2 arterial: 38.6 mmHg (ref 32.0–48.0)
pCO2 arterial: 36.2 mmHg (ref 32.0–48.0)
pH, Arterial: 6.987 — CL (ref 7.350–7.450)
pH, Arterial: 7.156 — CL (ref 7.350–7.450)
pO2, Arterial: 78 mmHg — ABNORMAL LOW (ref 83.0–108.0)
pO2, Arterial: 51 mmHg — ABNORMAL LOW (ref 83.0–108.0)

## 2018-11-26 LAB — COMPREHENSIVE METABOLIC PANEL
ALT: 38 U/L (ref 0–44)
AST: 79 U/L — ABNORMAL HIGH (ref 15–41)
Albumin: 2.7 g/dL — ABNORMAL LOW (ref 3.5–5.0)
Alkaline Phosphatase: 116 U/L (ref 38–126)
Anion gap: 13 (ref 5–15)
BUN: 40 mg/dL — ABNORMAL HIGH (ref 6–20)
CO2: 18 mmol/L — ABNORMAL LOW (ref 22–32)
Calcium: 8.4 mg/dL — ABNORMAL LOW (ref 8.9–10.3)
Chloride: 105 mmol/L (ref 98–111)
Creatinine, Ser: 1.74 mg/dL — ABNORMAL HIGH (ref 0.44–1.00)
GFR calc Af Amer: 42 mL/min — ABNORMAL LOW (ref >60)
GFR calc non Af Amer: 36 mL/min — ABNORMAL LOW (ref >60)
Glucose, Bld: 145 mg/dL — ABNORMAL HIGH (ref 70–99)
Potassium: 4.8 mmol/L (ref 3.5–5.1)
Sodium: 136 mmol/L (ref 135–145)
Total Bilirubin: 2.3 mg/dL — ABNORMAL HIGH (ref 0.3–1.2)
Total Protein: 7.2 g/dL (ref 6.5–8.1)

## 2018-11-26 LAB — CBC WITH DIFFERENTIAL/PLATELET
Abs Immature Granulocytes: 0.07 10*3/uL (ref 0.00–0.07)
Basophils Absolute: 0.1 10*3/uL (ref 0.0–0.1)
Basophils Relative: 1 %
Eosinophils Absolute: 0 10*3/uL (ref 0.0–0.5)
Eosinophils Relative: 0 %
HCT: 33.2 % — ABNORMAL LOW (ref 36.0–46.0)
Hemoglobin: 10.8 g/dL — ABNORMAL LOW (ref 12.0–15.0)
Immature Granulocytes: 1 %
Lymphocytes Relative: 25 %
Lymphs Abs: 1.8 10*3/uL (ref 0.7–4.0)
MCH: 24.6 pg — ABNORMAL LOW (ref 26.0–34.0)
MCHC: 32.5 g/dL (ref 30.0–36.0)
MCV: 75.6 fL — ABNORMAL LOW (ref 80.0–100.0)
Monocytes Absolute: 0.7 10*3/uL (ref 0.1–1.0)
Monocytes Relative: 10 %
Neutro Abs: 4.4 10*3/uL (ref 1.7–7.7)
Neutrophils Relative %: 63 %
Platelets: 298 10*3/uL (ref 150–400)
RBC: 4.39 MIL/uL (ref 3.87–5.11)
RDW: 17.4 % — ABNORMAL HIGH (ref 11.5–15.5)
WBC: 7.1 10*3/uL (ref 4.0–10.5)
nRBC: 0.9 % — ABNORMAL HIGH (ref 0.0–0.2)

## 2018-11-26 LAB — LACTIC ACID, PLASMA
Lactic Acid, Venous: >11 mmol/L (ref 0.5–1.9)
Lactic Acid, Venous: 10.5 mmol/L (ref 0.5–1.9)

## 2018-11-26 LAB — APTT
aPTT: 190 seconds (ref 24–36)
aPTT: 193 seconds (ref 24–36)
aPTT: 192 seconds (ref 24–36)

## 2018-11-26 LAB — ETHANOL: Alcohol, Ethyl (B): <10 mg/dL (ref <10)

## 2018-11-26 LAB — MRSA PCR SCREENING: MRSA by PCR: NEGATIVE

## 2018-11-26 LAB — BRAIN NATRIURETIC PEPTIDE: B Natriuretic Peptide: 2020.6 pg/mL — ABNORMAL HIGH (ref 0.0–100.0)

## 2018-11-26 LAB — TROPONIN I: Troponin I: 0.4 ng/mL (ref <0.03)

## 2018-11-26 MED ORDER — PHENYLEPHRINE 40 MCG/ML (10ML) SYRINGE FOR IV PUSH (FOR BLOOD PRESSURE SUPPORT)
200.0000 ug | PREFILLED_SYRINGE | Freq: Once | INTRAVENOUS | Status: AC
  Administered 2018-11-26: 200 ug via INTRAVENOUS

## 2018-11-26 MED ORDER — FENTANYL 2500MCG IN NS 250ML (10MCG/ML) PREMIX INFUSION: 0.0000 ug/h | INTRAVENOUS | Status: DC

## 2018-11-26 MED ORDER — DOPAMINE-DEXTROSE 3.2-5 MG/ML-% IV SOLN
0.0000 ug/kg/min | INTRAVENOUS | Status: DC
  Administered 2018-11-26: 3 ug/kg/min via INTRAVENOUS

## 2018-11-26 MED ORDER — ETOMIDATE 2 MG/ML IV SOLN
INTRAVENOUS | Status: AC | PRN
  Administered 2018-11-26: 20 mg via INTRAVENOUS

## 2018-11-26 MED ORDER — EPINEPHRINE 1 MG/10ML IJ SOSY
PREFILLED_SYRINGE | INTRAMUSCULAR | Status: AC | PRN
  Administered 2018-11-26 (×2): 1 mg via INTRAVENOUS

## 2018-11-26 MED ORDER — TENECTEPLASE 50 MG IV KIT
35.0000 mg | PACK | Freq: Once | INTRAVENOUS | Status: AC
  Administered 2018-11-26: 35 mg via INTRAVENOUS
  Filled 2018-11-26: qty 10

## 2018-11-26 MED ORDER — AMIODARONE LOAD VIA INFUSION
150.0000 mg | Freq: Once | INTRAVENOUS | Status: AC
  Administered 2018-11-26 (×2): 150 mg via INTRAVENOUS
  Filled 2018-11-26: qty 83.34

## 2018-11-26 MED ORDER — ETOMIDATE 2 MG/ML IV SOLN
INTRAVENOUS | Status: AC
  Filled 2018-11-26: qty 20

## 2018-11-26 MED ORDER — HEPARIN BOLUS VIA INFUSION
3000.0000 [IU] | Freq: Once | INTRAVENOUS | Status: AC
  Administered 2018-11-26: 3000 [IU] via INTRAVENOUS
  Filled 2018-11-26: qty 3000

## 2018-11-26 MED ORDER — NOREPINEPHRINE 4 MG/250ML-% IV SOLN
5.0000 ug/min | INTRAVENOUS | Status: DC
  Administered 2018-11-26: 40 ug/min via INTRAVENOUS
  Filled 2018-11-26: qty 250

## 2018-11-26 MED ORDER — FENTANYL 2500MCG IN NS 250ML (10MCG/ML) PREMIX INFUSION
0.0000 ug/h | INTRAVENOUS | Status: DC
  Administered 2018-11-26: 50 ug/h via INTRAVENOUS

## 2018-11-26 MED ORDER — SODIUM CHLORIDE 0.9 % IV SOLN
500.0000 mg | INTRAVENOUS | Status: DC
  Filled 2018-11-26: qty 500

## 2018-11-26 MED ORDER — SODIUM CHLORIDE 0.9 % IV BOLUS
1000.0000 mL | Freq: Once | INTRAVENOUS | Status: AC
  Administered 2018-11-26: 1000 mL via INTRAVENOUS

## 2018-11-26 MED ORDER — SODIUM CHLORIDE 0.9% FLUSH: 10.0000 mL | Freq: Two times a day (BID) | INTRAVENOUS | Status: DC

## 2018-11-26 MED ORDER — PROPOFOL 1000 MG/100ML IV EMUL
INTRAVENOUS | Status: AC
  Filled 2018-11-26: qty 100

## 2018-11-26 MED ORDER — SUCCINYLCHOLINE CHLORIDE 20 MG/ML IJ SOLN
INTRAMUSCULAR | Status: AC | PRN
  Administered 2018-11-26: 100 mg via INTRAVENOUS

## 2018-11-26 MED ORDER — ADENOSINE 6 MG/2ML IV SOLN
INTRAVENOUS | Status: AC | PRN
  Administered 2018-11-26: 12 mg via INTRAVENOUS
  Administered 2018-11-26: 6 mg via INTRAVENOUS

## 2018-11-26 MED ORDER — CHLORHEXIDINE GLUCONATE CLOTH 2 % EX PADS: 6.0000 | MEDICATED_PAD | Freq: Every day | CUTANEOUS | Status: DC

## 2018-11-26 MED ORDER — ATROPINE SULFATE 1 MG/ML IJ SOLN
INTRAMUSCULAR | Status: AC | PRN
  Administered 2018-11-26: 1 mg via INTRAVENOUS

## 2018-11-26 MED ORDER — IOHEXOL 300 MG/ML  SOLN
80.0000 mL | Freq: Once | INTRAMUSCULAR | Status: AC | PRN
  Administered 2018-11-26: 80 mL via INTRAVENOUS

## 2018-11-26 MED ORDER — AMIODARONE HCL IN DEXTROSE 360-4.14 MG/200ML-% IV SOLN: 30.0000 mg/h | INTRAVENOUS | Status: DC

## 2018-11-26 MED ORDER — SODIUM CHLORIDE 0.9% FLUSH: 10.0000 mL | INTRAVENOUS | Status: DC | PRN

## 2018-11-26 MED ORDER — LEVOFLOXACIN IN D5W 500 MG/100ML IV SOLN
500.0000 mg | INTRAVENOUS | Status: DC
  Administered 2018-11-26: 500 mg via INTRAVENOUS
  Filled 2018-11-26 (×2): qty 100

## 2018-11-26 MED ORDER — VANCOMYCIN HCL 10 G IV SOLR
1250.0000 mg | Freq: Once | INTRAVENOUS | Status: AC
  Administered 2018-11-26: 04:00:00 1250 mg via INTRAVENOUS
  Filled 2018-11-26: qty 1250

## 2018-11-26 MED ORDER — EPINEPHRINE 1 MG/10ML IJ SOSY
PREFILLED_SYRINGE | INTRAMUSCULAR | Status: AC | PRN
  Administered 2018-11-26: 1 mg via INTRAVENOUS

## 2018-11-26 MED ORDER — SODIUM BICARBONATE 8.4 % IV SOLN
INTRAVENOUS | Status: DC
  Administered 2018-11-26 (×2): via INTRAVENOUS
  Filled 2018-11-26 (×2): qty 150

## 2018-11-26 MED ORDER — PHENYLEPHRINE HCL-NACL 40-0.9 MG/250ML-% IV SOLN
25.0000 ug/min | INTRAVENOUS | Status: DC
  Filled 2018-11-26 (×4): qty 250

## 2018-11-26 MED ORDER — PHENYLEPHRINE HCL-NACL 40-0.9 MG/250ML-% IV SOLN
0.0000 ug/min | INTRAVENOUS | Status: DC
  Administered 2018-11-26: 40 ug/min via INTRAVENOUS
  Filled 2018-11-26: qty 250

## 2018-11-26 MED ORDER — MAGNESIUM SULFATE 2 GM/50ML IV SOLN
2.0000 g | Freq: Once | INTRAVENOUS | Status: AC
  Administered 2018-11-26: 2 g via INTRAVENOUS

## 2018-11-26 MED ORDER — CHLORHEXIDINE GLUCONATE 0.12% ORAL RINSE (MEDLINE KIT): 15.0000 mL | Freq: Two times a day (BID) | OROMUCOSAL | Status: DC

## 2018-11-26 MED ORDER — HEPARIN (PORCINE) 25000 UT/250ML-% IV SOLN
1150.0000 [IU]/h | INTRAVENOUS | Status: DC
  Administered 2018-11-26: 1150 [IU]/h via INTRAVENOUS
  Filled 2018-11-26: qty 250

## 2018-11-26 MED ORDER — SODIUM CHLORIDE 0.9 % IV SOLN
2.0000 g | INTRAVENOUS | Status: DC
  Filled 2018-11-26: qty 20

## 2018-11-26 MED ORDER — SODIUM CHLORIDE 0.9 % IV SOLN
2.0000 g | Freq: Once | INTRAVENOUS | Status: AC
  Administered 2018-11-26: 2 g via INTRAVENOUS
  Filled 2018-11-26: qty 2

## 2018-11-26 MED ORDER — ADENOSINE 6 MG/2ML IV SOLN
INTRAVENOUS | Status: AC
  Filled 2018-11-26: qty 8

## 2018-11-26 MED ORDER — SODIUM CHLORIDE 0.9 % IV SOLN
INTRAVENOUS | Status: DC
  Administered 2018-11-26: 09:00:00 via INTRAVENOUS

## 2018-11-26 MED ORDER — SODIUM CHLORIDE 0.9 % IV SOLN: 2.0000 g | Freq: Three times a day (TID) | INTRAVENOUS | Status: DC

## 2018-11-26 MED ORDER — NOREPINEPHRINE 4 MG/250ML-% IV SOLN
INTRAVENOUS | Status: AC
  Administered 2018-11-26: 30 ug/min via INTRAVENOUS
  Filled 2018-11-26: qty 250

## 2018-11-26 MED ORDER — SODIUM BICARBONATE 8.4 % IV SOLN
INTRAVENOUS | Status: AC | PRN
  Administered 2018-11-26 (×2): 50 meq via INTRAVENOUS

## 2018-11-26 MED ORDER — EPINEPHRINE 1 MG/10ML IJ SOSY
PREFILLED_SYRINGE | INTRAMUSCULAR | Status: AC | PRN
  Administered 2018-11-26: 1 via INTRAVENOUS

## 2018-11-26 MED ORDER — DEXTROSE 5 % IV SOLN
INTRAVENOUS | Status: AC | PRN
  Administered 2018-11-26: 150 mg via INTRAVENOUS

## 2018-11-26 MED ORDER — MIDAZOLAM 50MG/50ML (1MG/ML) PREMIX INFUSION: 0.5000 mg/h | INTRAVENOUS | Status: DC

## 2018-11-26 MED ORDER — SODIUM CHLORIDE 0.9 % IV SOLN: 250.0000 mL | INTRAVENOUS | Status: DC

## 2018-11-26 MED ORDER — EPINEPHRINE PF 1 MG/ML IJ SOLN
0.5000 ug/min | INTRAVENOUS | Status: DC
  Filled 2018-11-26 (×2): qty 4

## 2018-11-26 MED ORDER — AMIODARONE HCL IN DEXTROSE 360-4.14 MG/200ML-% IV SOLN
INTRAVENOUS | Status: AC
  Filled 2018-11-26: qty 200

## 2018-11-26 MED ORDER — SODIUM BICARBONATE 8.4 % IV SOLN
50.0000 meq | Freq: Once | INTRAVENOUS | Status: AC
  Administered 2018-11-26: 50 meq via INTRAVENOUS

## 2018-11-26 MED ORDER — FENTANYL 2500MCG IN NS 250ML (10MCG/ML) PREMIX INFUSION
0.0000 ug/h | INTRAVENOUS | Status: DC
  Filled 2018-11-26: qty 250

## 2018-11-26 MED ORDER — NOREPINEPHRINE 16 MG/250ML-% IV SOLN
5.0000 ug/min | INTRAVENOUS | Status: DC
  Administered 2018-11-26: 09:00:00 45 ug/min via INTRAVENOUS
  Filled 2018-11-26: qty 250

## 2018-11-26 MED ORDER — FAMOTIDINE IN NACL 20-0.9 MG/50ML-% IV SOLN: 20.0000 mg | Freq: Two times a day (BID) | INTRAVENOUS | Status: DC

## 2018-11-26 MED ORDER — NOREPINEPHRINE 4 MG/250ML-% IV SOLN
0.0000 ug/min | INTRAVENOUS | Status: DC
  Administered 2018-11-26: 20 ug/min via INTRAVENOUS
  Administered 2018-11-26: 30 ug/min via INTRAVENOUS
  Administered 2018-11-26: 10 ug/min via INTRAVENOUS
  Filled 2018-11-26: qty 250

## 2018-11-26 MED ORDER — AMIODARONE HCL IN DEXTROSE 360-4.14 MG/200ML-% IV SOLN
60.0000 mg/h | INTRAVENOUS | Status: DC
  Filled 2018-11-26: qty 200

## 2018-11-26 MED ORDER — METRONIDAZOLE IN NACL 5-0.79 MG/ML-% IV SOLN
500.0000 mg | Freq: Three times a day (TID) | INTRAVENOUS | Status: DC
  Filled 2018-11-26: qty 100

## 2018-11-26 MED ORDER — SODIUM BICARBONATE 8.4 % IV SOLN
200.0000 meq | Freq: Once | INTRAVENOUS | Status: AC
  Administered 2018-11-26: 100 meq via INTRAVENOUS

## 2018-11-26 MED ORDER — ORAL CARE MOUTH RINSE: 15.0000 mL | OROMUCOSAL | Status: DC

## 2018-11-26 MED FILL — Medication: Qty: 1 | Status: AC

## 2018-11-27 NOTE — ED Notes (Signed)
Contacted emergency contact x2... Left voicemail to contact ED

## 2018-11-27 NOTE — Consult Note (Signed)
Reason for Consult: ARF Referring Physician:  Valeta Harms, MD  Juluis Mire Christine Cobb is an 43 y.o. female.  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  In the ED she suffered 3 separate cardiac arrests, was intubated, placed on 4 pressors and transferred to the CCU.  She has received systemic TPA in the ED peri-arrest.  We were consulted due to the development of oliguric AKI/CKD.  Trend in Creatinine:  Creatinine, Ser  Date/Time Value Ref Range Status  December 10, 2018 12:11 AM 1.74 (H) 0.44 - 1.00 mg/dL Final  11/14/2018 06:15 PM 1.33 (H) 0.44 - 1.00 mg/dL Final  11/13/2018 06:40 PM 1.36 (H) 0.44 - 1.00 mg/dL Final  11/12/2018 06:06 PM 1.04 (H) 0.44 - 1.00 mg/dL Final  11/11/2018 06:53 PM 1.32 (H) 0.44 - 1.00 mg/dL Final  11/10/2018 05:59 PM 1.38 (H) 0.44 - 1.00 mg/dL Final  11/09/2018 06:27 AM 1.46 (H) 0.44 - 1.00 mg/dL Final  11/08/2018 11:34 AM 1.60 (H) 0.44 - 1.00 mg/dL Final  11/06/2018 04:40 PM 1.37 (H) 0.44 - 1.00 mg/dL Final  11/05/2018 08:38 PM 1.18 (H) 0.44 - 1.00 mg/dL Final  11/04/2018 06:42 PM 1.03 (H) 0.44 - 1.00 mg/dL Final  11/03/2018 06:16 PM 1.06 (H) 0.44 - 1.00 mg/dL Final  11/02/2018 07:16 PM 1.06 (H) 0.44 - 1.00 mg/dL Final  11/01/2018 04:18 PM 1.16 (H) 0.44 - 1.00 mg/dL Final  10/31/2018 02:36 PM 1.14 (H) 0.44 - 1.00 mg/dL Final  10/30/2018 05:28 AM 1.43 (H) 0.44 - 1.00 mg/dL Final  10/29/2018 12:35 PM 1.39 (H) 0.44 - 1.00 mg/dL Final  10/05/2018 07:24 AM 1.15 (H) 0.44 - 1.00 mg/dL Final  08/23/2018 12:01 AM 1.38 (H) 0.44 - 1.00 mg/dL Final  08/17/2018 02:50 AM 1.09 (H) 0.44 - 1.00 mg/dL Final  08/15/2018 03:05 AM 1.17 (H) 0.44 - 1.00 mg/dL Final  08/14/2018 07:00 AM 1.21 (H) 0.44 - 1.00 mg/dL  Final  08/13/2018 03:58 AM 1.24 (H) 0.44 - 1.00 mg/dL Final  08/12/2018 10:13 AM 1.26 (H) 0.44 - 1.00 mg/dL Final  08/11/2018 03:37 AM 1.52 (H) 0.44 - 1.00 mg/dL Final  08/10/2018 03:03 AM 1.41 (H) 0.44 - 1.00 mg/dL Final  08/09/2018 04:50 PM 1.34 (H) 0.44 - 1.00 mg/dL Final  07/06/2018 06:14 PM 1.35 (H) 0.44 - 1.00 mg/dL Final  11/12/2017 08:06 AM 1.39 (H) 0.44 - 1.00 mg/dL Final  07/19/2017 01:39 PM 1.11 (H) 0.44 - 1.00 mg/dL Final  04/28/2017 09:44 AM 1.17 (H) 0.44 - 1.00 mg/dL Final  10/22/2016 09:23 AM 0.90 0.44 - 1.00 mg/dL Final  10/22/2016 09:14 AM 0.99 0.44 - 1.00 mg/dL Final  06/01/2016 02:28 PM 0.99 0.44 - 1.00 mg/dL Final  04/20/2016 05:38 PM 0.93 0.44 - 1.00 mg/dL Final  03/29/2016 11:03 AM 0.85 0.44 - 1.00 mg/dL Final  03/17/2016 07:50 PM 1.02 (H) 0.44 - 1.00 mg/dL Final  04/02/2015 07:27 PM 1.16 (H) 0.44 - 1.00 mg/dL Final  01/02/2015 03:01 PM 0.92 0.44 - 1.00 mg/dL Final  12/30/2014 08:39 AM 1.12 (H) 0.44 - 1.00 mg/dL Final  11/29/2014 11:54 PM 1.33 (H) 0.44 - 1.00 mg/dL Final  10/12/2014 10:15 AM 1.00 0.50 - 1.10 mg/dL Final  07/04/2014 12:31 PM 0.91 0.50 - 1.10 mg/dL Final  06/01/2014 06:41 PM 1.28 (H) 0.50 - 1.10  mg/dL Final  03/15/2014 11:12 AM 1.00 0.50 - 1.10 mg/dL Final  01/05/2014 06:46 PM 1.19 (H) 0.50 - 1.10 mg/dL Final  01/05/2014 06:52 AM 1.47 (H) 0.50 - 1.10 mg/dL Final  09/02/2013 12:30 PM 0.91 0.50 - 1.10 mg/dL Final  06/29/2013 10:55 PM 0.70 0.50 - 1.10 mg/dL Final  06/01/2013 04:33 AM 0.68 0.50 - 1.10 mg/dL Final  05/17/2013 03:00 PM 1.06 0.50 - 1.10 mg/dL Final  05/17/2013 09:47 AM 1.04 0.50 - 1.10 mg/dL Final    PMH:   Past Medical History:  Diagnosis Date  . Arm pain   . Bipolar affective disorder, currently manic, mild ([place])   . CHF (congestive heart failure) ([place])   . Eye globe prosthesis   . HTN (hypertension)   . Hyperthyroidism   . Sinus tachycardia     PSH:   Past Surgical History:  Procedure Laterality Date  . DILATION AND  CURETTAGE OF UTERUS    . EYE SURGERY      Allergies:  Allergies  Allergen Reactions  . Amoxil [Amoxicillin] Anaphylaxis    Did it involve swelling of the face/tongue/throat, SOB, or low BP? yes Did it involve sudden or severe rash/hives, skin peeling, or any reaction on the inside of your mouth or nose? yes Did you need to seek medical attention at a hospital or doctor's office? unknown When did it last happen?unknoen If all above answers are "NO", may proceed with cephalosporin use.   Marland Kitchen Hydroxyzine     Other reaction(s): Bleeding  . Ketorolac Tromethamine Hives and Swelling  . Prenatal [B-Plex Plus] Nausea Only  . Tegretol [Carbamazepine] Other (See Comments)    Swelling, skin peeling, skin discoloration  . B-Plex Plus Nausea Only    Prenatal   . Tegretol [Carbamazepine] Swelling and Other (See Comments)    Skin peeling Skin discoloring     Medications:   Prior to Admission medications   Medication Sig Start Date End Date Taking? Authorizing Provider  acetaminophen (TYLENOL) 325 MG tablet Take 2 tablets (650 mg total) by mouth every 6 (six) hours as needed for mild pain or headache. 11/15/18   Mullis, Kiersten P, DO  amantadine (SYMMETREL) 100 MG capsule Take 1 capsule (100 mg total) by mouth 2 (two) times daily. 11/15/18   Mullis, Kiersten P, DO  calcium carbonate (TUMS - DOSED IN MG ELEMENTAL CALCIUM) 500 MG chewable tablet Chew 1 tablet (200 mg of elemental calcium total) by mouth 2 (two) times daily as needed for indigestion or heartburn. 11/15/18   Mullis, Kiersten P, DO  carvedilol (COREG) 3.125 MG tablet Take 1 tablet (3.125 mg total) by mouth 2 (two) times daily with a meal. 11/15/18   Mullis, Kiersten P, DO  feeding supplement, ENSURE ENLIVE, (ENSURE ENLIVE) LIQD Take 237 mLs by mouth 3 (three) times daily between meals. 11/15/18   Mullis, Kiersten P, DO  furosemide (LASIX) 80 MG tablet Take 2 tablets (160 mg total) by mouth daily. 11/16/18   Mullis, Kiersten P, DO   guaiFENesin-dextromethorphan (ROBITUSSIN DM) 100-10 MG/5ML syrup Take 5 mLs by mouth every 4 (four) hours as needed for cough. 11/15/18   Mullis, Kiersten P, DO  haloperidol (HALDOL) 10 MG tablet Take 1 tablet (10 mg total) by mouth at bedtime. 11/15/18   Mullis, Kiersten P, DO  Melatonin 3 MG TABS Take 1-2 tablets (3-6 mg total) by mouth at bedtime as needed (for sleep). 11/15/18   Mullis, Kiersten P, DO  Multiple Vitamin (MULTIVITAMIN WITH MINERALS) TABS tablet Take 1 tablet  by mouth daily. 11/16/18   Mullis, Kiersten P, DO  nicotine (NICODERM CQ - DOSED IN MG/24 HR) 7 mg/24hr patch Place 1 patch (7 mg total) onto the skin daily. 11/15/18   Mullis, Kiersten P, DO  polyethylene glycol (MIRALAX / GLYCOLAX) 17 g packet Take 17 g by mouth 2 (two) times daily. 11/15/18   Mullis, Kiersten P, DO  Rivaroxaban (XARELTO) 15 MG TABS tablet Take 1 tablet (15 mg total) by mouth 2 (two) times daily with a meal for 6 days. 11/15/18 11/21/18  Mullis, Kiersten P, DO  rivaroxaban (XARELTO) 20 MG TABS tablet Take 1 tablet (20 mg total) by mouth daily with supper. 11/22/18   Mullis, Kiersten P, DO  rivaroxaban (XARELTO) 20 MG TABS tablet Take 1 tablet (20 mg total) by mouth daily. 11/21/18   Mullis, Kiersten P, DO  tiotropium (SPIRIVA HANDIHALER) 18 MCG inhalation capsule Place 1 capsule (18 mcg total) into inhaler and inhale every morning. 11/15/18   Mullis, Kiersten P, DO  traZODone (DESYREL) 100 MG tablet Take 2 tablets (200 mg total) by mouth at bedtime. 11/15/18   Mullis, Archie Endo, DO    Inpatient medications: . chlorhexidine gluconate (MEDLINE KIT)  15 mL Mouth Rinse BID  . Chlorhexidine Gluconate Cloth  6 each Topical Q0600  . Chlorhexidine Gluconate Cloth  6 each Topical Daily  . mouth rinse  15 mL Mouth Rinse 10 times per day  . sodium chloride flush  10-40 mL Intracatheter Q12H    Discontinued Meds:   Medications Discontinued During This Encounter  Medication Reason  . cefTRIAXone (ROCEPHIN) 2 g in sodium  chloride 0.9 % 100 mL IVPB Change in therapy  . aztreonam (AZACTAM) 2 g in sodium chloride 0.9 % 100 mL IVPB Entry Error  . heparin ADULT infusion 100 units/mL (25000 units/29m sodium chloride 0.45%)   . fentaNYL 25029m in NS 2502m54m78ml) infusion-PREMIX   . norepinephrine (LEVOPHED) 4mg 40m250mL 63mix infusion   . fentaNYL 2500mcg 71mS 250mL (151m/ml66mfusion-PREMIX   . phenylephrine (NEOSYNEPHRINE) 40-0.9 MG/250ML-% infusion   . azithromycin (ZITHROMAX) 500 mg in sodium chloride 0.9 % 250 mL IVPB   . norepinephrine (LEVOPHED) 4mg in 2517m premi58mfusion   . phenylephrine (NEOSYNEPHRINE) 40-0.9 MG/250ML-% infusion     Social History:  reports that she has been smoking cigarettes. She has never used smokeless tobacco. She reports current alcohol use. She reports current drug use. Drug: Marijuana.  Family History:   Family History  Problem Relation Age of Onset  . Hypertension Other   . Emphysema Other   . Asthma Son   . Diabetes Maternal Uncle   . Diabetes Paternal Grandmother     Review of systems not obtained due to patient factors. Weight change:   Intake/Output Summary (Last 24 hours) at May 31, 2019 106/18/2020data filed at May 31, 2019 02020/06/18 per 24 hour  Intake 1784.04 ml  Output -  Net 1784.04 ml   BP (!) 147/76   Pulse (!) 142   Temp 98.7 F (37.1 C) (Oral)   Resp (!) 31   Wt 63.5 kg   SpO2 100%   BMI 24.03 kg/m  Vitals:   09-07-18 0706/18/20/20 07June 18, 2020/20 0806/18/2020/20 09June 18, 2020(!) 78/67  103/62 (!) 147/76  Pulse: 81  (!) 142   Resp: 13  (!) 33 (!) 31  Temp:      TempSrc:      SpO2: 100% 100% 100%   Weight:  General appearance: Intubated and sedated, with orbital edema, fixed and dilated right pupil, artificial left eye, hematoma on right neck Head: Normocephalic, without obvious abnormality, atraumatic Resp: diminished breath sounds bibasilar Cardio: tachycardic, no rub GI: distended, hypoactive bowel sounds Extremities: edema  trace and mottled appearance and cool to touch  Labs: Basic Metabolic Panel: Recent Labs  Lab 11/29/2018 0011 11/29/18 0302 11-29-18 0950  NA 136 138 144  K 4.8 4.7 3.7  CL 105  --   --   CO2 18*  --   --   GLUCOSE 145*  --   --   BUN 40*  --   --   CREATININE 1.74*  --   --   ALBUMIN 2.7*  --   --   CALCIUM 8.4*  --   --    Liver Function Tests: Recent Labs  Lab November 29, 2018 0011  AST 79*  ALT 38  ALKPHOS 116  BILITOT 2.3*  PROT 7.2  ALBUMIN 2.7*   No results for input(s): LIPASE, AMYLASE in the last 168 hours. No results for input(s): AMMONIA in the last 168 hours. CBC: Recent Labs  Lab 2018-11-29 0011 Nov 29, 2018 0302 11-29-2018 0950  WBC 7.1  --   --   NEUTROABS 4.4  --   --   HGB 10.8* 12.9 12.9  HCT 33.2* 38.0 38.0  MCV 75.6*  --   --   PLT 298  --   --    PT/INR: @LABRCNTIP (inr:5) Cardiac Enzymes: ) Recent Labs  Lab 11-29-2018 0011  TROPONINI 0.40*   CBG: No results for input(s): GLUCAP in the last 168 hours.  Iron Studies: No results for input(s): IRON, TIBC, TRANSFERRIN, FERRITIN in the last 168 hours.  Xrays/Other Studies: Ct Head Wo Contrast  Result Date: 29-Nov-2018 CLINICAL DATA:  Patient status post cardiopulmonary resuscitation this morning. EXAM: CT HEAD WITHOUT CONTRAST TECHNIQUE: Contiguous axial images were obtained from the base of the skull through the vertex without intravenous contrast. COMPARISON:  Head CT scan 03/05/2015. FINDINGS: Brain: No evidence of acute infarction, hemorrhage, hydrocephalus, extra-axial collection or mass lesion/mass effect. Vascular: No hyperdense vessel or unexpected calcification. Skull: Intact.  No focal lesion. Sinuses/Orbits: Left mastoid effusion is noted. Other: Endotracheal tube and OG tube are identified. IMPRESSION: No acute abnormality. Electronically Signed   By: Inge Rise M.D.   On: 11/29/2018 10:37   Ct Angio Chest Pe W Or Wo Contrast  Result Date: Nov 29, 2018 CLINICAL DATA:  Short of breath EXAM:  CT ANGIOGRAPHY CHEST WITH CONTRAST TECHNIQUE: Multidetector CT imaging of the chest was performed using the standard protocol during bolus administration of intravenous contrast. Multiplanar CT image reconstructions and MIPs were obtained to evaluate the vascular anatomy. CONTRAST:  56m OMNIPAQUE IOHEXOL 300 MG/ML  SOLN COMPARISON:  10/31/2018 FINDINGS: Cardiovascular: There are no filling defects in the pulmonary arterial tree to suggest acute pulmonary thromboembolism. Small defects within segmental and subsegmental branches in the lower lobes have resolved. The heart is enlarged. The ascending aorta is nonaneurysmal. Mediastinum/Nodes: No abnormal mediastinal adenopathy. There is fluid density within the mediastinal fat likely region due to edema. No pericardial effusion. Lungs/Pleura: No pneumothorax. There is extensive consolidation and ground-glass opacifications throughout both lungs which has significantly worsened since the prior study. Small bilateral pleural effusions right greater than left. Endotracheal tube is in place with its tip 2.9 cm from the carina. Upper Abdomen: NG tube is in place with its tip beyond the gastroesophageal junction. Musculoskeletal: No vertebral compression deformity. Review of the MIP images  confirms the above findings. IMPRESSION: No evidence of acute pulmonary thromboembolism. Extensive consolidation and ground-glass opacification throughout both lungs which has significantly worsened. Small bilateral pleural effusions right greater than left. Electronically Signed   By: Marybelle Killings M.D.   On: 2018-12-02 10:39   Dg Chest Portable 1 View  Result Date: 02-Dec-2018 CLINICAL DATA:  Central line placement EXAM: PORTABLE CHEST 1 VIEW COMPARISON:  12/02/2018 FINDINGS: Multifocal right lung opacities. Possible perihilar left lung opacity, equivocal. Defibrillator pads overlie the left hemithorax. Moderate layering right pleural effusion.  No pneumothorax. Endotracheal tube  terminates 4 cm above the carina. Enteric tube courses below the diaphragm. Right IJ venous catheter terminates in the lower SVC. Cardiomegaly. Overall appearance is grossly unchanged. IMPRESSION: Endotracheal tube terminates 4 cm above the carina. Additional support apparatus as above. Multifocal right lung opacities with moderate layering right pleural effusion, unchanged. Electronically Signed   By: Julian Hy M.D.   On: 12-02-2018 06:29   Dg Chest Portable 1 View  Result Date: 12-02-18 CLINICAL DATA:  Central line placement EXAM: PORTABLE CHEST 1 VIEW COMPARISON:  12-02-2018, 10/29/2018, CT chest 10/31/2018 FINDINGS: Endotracheal tube tip is about 3.8 cm superior to the carina. Esophageal tube tip below the diaphragm and in the left upper quadrant. New right IJ central venous catheter tip over the SVC. No pneumothorax. Worsened opacity throughout the right thorax. Cardiomegaly. Small left effusion IMPRESSION: 1. Right IJ central venous catheter tip over the distal SVC. No pneumothorax 2. Worsened hazy opacity of right thorax, likely combination of layering moderate pleural effusion and underlying airspace disease as there are increasing air bronchograms. 3. Cardiomegaly.  Probable small left effusion Electronically Signed   By: Donavan Foil M.D.   On: December 02, 2018 03:16   Dg Chest Portable 1 View  Result Date: 12-02-18 CLINICAL DATA:  Acute respiratory failure. Intubation. EXAM: PORTABLE CHEST 1 VIEW COMPARISON:  12-02-2018 FINDINGS: There has been placement of an endotracheal tube, with tip approximately 3.7 cm above the carina. A nasogastric tube is also seen entering the stomach. Stable mild-to-moderate cardiomegaly. Small right pleural effusion and right basilar atelectasis are also unchanged. Left lung is clear. IMPRESSION: 1. Endotracheal tube and nasogastric tube in appropriate position. 2. Stable cardiomegaly, small right pleural effusion and right basilar atelectasis. Electronically  Signed   By: Earle Gell M.D.   On: 12-02-2018 02:23   Dg Chest Port 1 View  Result Date: 02-Dec-2018 CLINICAL DATA:  Shortness of breath EXAM: PORTABLE CHEST 1 VIEW COMPARISON:  10/29/2018, 10/05/2018, CT 10/31/2018 FINDINGS: Cardiomegaly. Small moderate right pleural effusion without significant change. Central vascular congestion. Dense consolidation at the right middle lobe and right base. IMPRESSION: 1. Cardiomegaly with vascular congestion. 2. Small-moderate right pleural effusion with dense consolidation at the right middle lobe and right base which may reflect pneumonia. Electronically Signed   By: Donavan Foil M.D.   On: 12/02/2018 00:50     Assessment/Plan: 1.  AKI/CKD stage 3 in setting of multiple cardiac arrests on 4 pressors.  Given her end-stage cardiomyopathy, wish to be DNR, and fixed and dilated pupil concerning for anoxic brain injury, she is not a candidate for RRT including CVVHD.  Agree with PCCM and Cardiology to transition to comfort measures. 2. Cardiogenic shock following 3 cardiac arrests- on pressors 3. VDRF- per PCCM 4. Polysubstance abuse 5. Disposition- poor overall prognosis and recommend transition to comfort measures.   Broadus John A Derl Abalos 12/02/18, 11:13 AM

## 2018-11-27 NOTE — Consult Note (Addendum)
Palliative Care Consult Note  Ms. Hardt is well known to me from her prior hospitalizations. I have had discussions with her in the past about her EOL wishes given her end stage CHF and multiple other comorbidities-profound psychiatric illness, ongoing crack cocaine abuse, recurring necrotizing pneumonias and homelessness.  She has expressed to me on prior consults she would not want to be "kept alive by machines". She knew how sick she was and that she was terminal- when I had chances to speak with her when she was not having decompensated psych issues.  The compassionate and humane thing as well as the right thing medically to do for her is to remove her from the vent and allow a natural death to occur. She has no guardian and no surrogate decision makers. There are estranged family members but we have never been successful in contacting them in the past.  Recommend terminal wean given her current condition and neurological devastation- all irreversible and will result in her death despite our current interventions. I have discussed this in detail with Dr. Linna Caprice will proceed with initiation of comfort care, and in line with [place] Statutes regarding patients with no medical surrogate decision makers. Called listed contact who would not provide information and did not seem reliable, capable or knowledgeable about the patients condition.  Orders for comfort care will be placed.  Anderson Malta, DO Palliative Medicine

## 2018-11-27 NOTE — Progress Notes (Signed)
RT note: RT and RN transferred vent patient to 2H. Vital signs stable through out.

## 2018-11-27 NOTE — Progress Notes (Signed)
Pt expired at 1345. Ventilator turned off this time. Will extubate when nursing ready.

## 2018-11-27 NOTE — Progress Notes (Signed)
I know the patient very well and have taken care of her on a prior visit.  Patient was made DNR and was actually referred for comfort measures and palliative care.  Unfortunately patient presented to the hospital with cardiopulmonary arrest/shock and has now been resuscitated.  Her care is futile, she has dilated pupils, she has severely depressed LV systolic function, noncompliance with medication, mental disorder, no family support, we were never able to trace her surviving son who does not have anything to do with her.  I wholeheartedly support comfort measures only.  I will change her CODE STATUS to DNR for now.  No escalation of present therapy is indicated and I have had long discussion with Dr. Wendall Mola (pulmonary critical care).  Yates Decamp, MD, [facility] 12/13/2018, 10:02 AM [facility] Pager: [phone] Office: [phone] If no answer Cell [phone]

## 2018-11-27 NOTE — Progress Notes (Signed)
Critical ABG results given to Dr. Tonia Brooms. No new orders received at this time.

## 2018-11-27 NOTE — Progress Notes (Addendum)
ANTICOAGULATION CONSULT NOTE - Initial Consult  Pharmacy Consult for heparin Indication: pulmonary embolus  Allergies  Allergen Reactions  . Amoxil [Amoxicillin] Anaphylaxis    Did it involve swelling of the face/tongue/throat, SOB, or low BP? yes Did it involve sudden or severe rash/hives, skin peeling, or any reaction on the inside of your mouth or nose? yes Did you need to seek medical attention at a hospital or doctor's office? unknown When did it last happen?unknoen If all above answers are "NO", may proceed with cephalosporin use.   Marland Kitchen Hydroxyzine     Other reaction(s): Bleeding  . Ketorolac Tromethamine Hives and Swelling  . Prenatal [B-Plex Plus] Nausea Only  . Tegretol [Carbamazepine] Other (See Comments)    Swelling, skin peeling, skin discoloration  . B-Plex Plus Nausea Only    Prenatal   . Tegretol [Carbamazepine] Swelling and Other (See Comments)    Skin peeling Skin discoloring     Patient Measurements: Weight: 140 lb (63.5 kg)  Vital Signs: Temp: 98.7 F (37.1 C) (05/31 0012) Temp Source: Oral (05/31 0012) BP: 129/100 (05/31 0145) Pulse Rate: 72 (05/31 0145)  Labs: Recent Labs    Dec 02, 2018 0011  HGB 10.8*  HCT 33.2*  PLT 298  CREATININE 1.74*  TROPONINI 0.40*    Estimated Creatinine Clearance: 37.1 mL/min (A) (by C-G formula based on SCr of 1.74 mg/dL (H)).   Medical History: Past Medical History:  Diagnosis Date  . Arm pain   . Bipolar affective disorder, currently manic, mild ([facility])   . CHF (congestive heart failure) ([facility])   . Eye globe prosthesis   . HTN (hypertension)   . Hyperthyroidism   . Sinus tachycardia     Assessment: 43yo female presents to ED for SOB/CP and HR to 200s; pt was dx'd w/ PE earlier this month and has Rx for Xarelto but has been off all meds for at least two weeks; pt now to start heparin.  Goal of Therapy:  Heparin level 0.3-0.7 units/ml Monitor platelets by anticoagulation protocol: Yes   Plan:  Will  give heparin 3000 units IV bolus x1 followed by gtt at 1150 units/hr and monitor heparin levels and CBC.  Vernard Gambles, PharmD, BCPS  December 02, 2018,1:59 AM   Addendum: Pt decompensated in ED, was intubated, and eventually arrested x2.  Decision was made by EDP to give tenecteplase 35mg  for presumed PE.  RN stopped heparin.  Will monitor aPTT and restart heparin when <80.  VB 3:42 AM

## 2018-11-27 NOTE — ED Notes (Signed)
ED TO INPATIENT HANDOFF REPORT  ED Nurse Name and Phone #:  [phone]  S Name/Age/Gender Christine Cobb 43 y.o. female Room/Bed: 035C/035C  Code Status   Code Status: Full Code  Home/SNF/Other Home Patient oriented to: self - Intubated Is this baseline? No   Triage Complete: Triage complete  Chief Complaint AFIB/Hypotensive  Triage Note Pt here via [facility] from home due to SOB. On arrival pt HR in the 200's with c/o chest pain. Pt has not been taking meds for the past two weeks.   Allergies Allergies  Allergen Reactions  . Amoxil [Amoxicillin] Anaphylaxis    Did it involve swelling of the face/tongue/throat, SOB, or low BP? yes Did it involve sudden or severe rash/hives, skin peeling, or any reaction on the inside of your mouth or nose? yes Did you need to seek medical attention at a hospital or doctor's office? unknown When did it last happen?unknoen If all above answers are "NO", may proceed with cephalosporin use.   Marland Kitchen Hydroxyzine     Other reaction(s): Bleeding  . Ketorolac Tromethamine Hives and Swelling  . Prenatal [B-Plex Plus] Nausea Only  . Tegretol [Carbamazepine] Other (See Comments)    Swelling, skin peeling, skin discoloration  . B-Plex Plus Nausea Only    Prenatal   . Tegretol [Carbamazepine] Swelling and Other (See Comments)    Skin peeling Skin discoloring     Level of Care/Admitting Diagnosis ED Disposition    ED Disposition Condition Comment   Admit  Hospital Area[facility] [[id number]]  Level of Care: ICU [6]  Covid Evaluation: Confirmed COVID Negative  Diagnosis: Cardiac arrest (HCC) [427.5.ICD-9-CM]  Admitting Physician: Norman Clay [[id number]]  Attending Physician: Norman Clay [[id number]]  Estimated length of stay: past midnight tomorrow  Certification:: I certify this patient is being admitted for an inpatient-only procedure  PT Class (Do Not Modify): Inpatient [101]  PT Acc Code (Do Not Modify):  Private [1]       B Medical/Surgery History Past Medical History:  Diagnosis Date  . Arm pain   . Bipolar affective disorder, currently manic, mild (HCC)   . CHF (congestive heart failure) (HCC)   . Eye globe prosthesis   . HTN (hypertension)   . Hyperthyroidism   . Sinus tachycardia    Past Surgical History:  Procedure Laterality Date  . DILATION AND CURETTAGE OF UTERUS    . EYE SURGERY       A IV Location/Drains/Wounds Patient Lines/Drains/Airways Status   Active Line/Drains/Airways    Name:   Placement date:   Placement time:   Site:   Days:   Peripheral IV 11/09/2018 Right Forearm   10/27/2018    0000    Forearm   less than 1   Peripheral IV 11/06/2018 Left Forearm   10/28/2018    -    Forearm   less than 1   Peripheral IV 10/27/2018 Left Wrist   11/22/2018    0232    Wrist   less than 1   CVC Triple Lumen 11/19/2018 Right Internal jugular   11/18/2018    0242     less than 1   NG/OG Tube Orogastric 16 Fr. Center mouth Xray;Aucultation   11/13/2018    0144    Center mouth   less than 1   Urethral Catheter Alma Downs, RN Latex;Straight-tip;Temperature probe 14 Fr.   11/17/2018    0327    Latex;Straight-tip;Temperature probe   less than 1   Airway 7.5  mm   11/24/2018    0140     less than 1          Intake/Output Last 24 hours  Intake/Output Summary (Last 24 hours) at 11/24/2018 3335 Last data filed at 10/31/2018 4562 Gross per 24 hour  Intake 1350 ml  Output -  Net 1350 ml    Labs/Imaging Results for orders placed or performed during the hospital encounter of 11/27/2018 (from the past 48 hour(s))  CBC with Differential/Platelet     Status: Abnormal   Collection Time: 11/17/2018 12:11 AM  Result Value Ref Range   WBC 7.1 4.0 - 10.5 K/uL   RBC 4.39 3.87 - 5.11 MIL/uL   Hemoglobin 10.8 (L) 12.0 - 15.0 g/dL   HCT 56.3 (L) 89.3 - 73.4 %   MCV 75.6 (L) 80.0 - 100.0 fL   MCH 24.6 (L) 26.0 - 34.0 pg   MCHC 32.5 30.0 - 36.0 g/dL   RDW 28.7 (H) 68.1 - 15.7 %   Platelets 298 150 -  400 K/uL   nRBC 0.9 (H) 0.0 - 0.2 %   Neutrophils Relative % 63 %   Neutro Abs 4.4 1.7 - 7.7 K/uL   Lymphocytes Relative 25 %   Lymphs Abs 1.8 0.7 - 4.0 K/uL   Monocytes Relative 10 %   Monocytes Absolute 0.7 0.1 - 1.0 K/uL   Eosinophils Relative 0 %   Eosinophils Absolute 0.0 0.0 - 0.5 K/uL   Basophils Relative 1 %   Basophils Absolute 0.1 0.0 - 0.1 K/uL   Immature Granulocytes 1 %   Abs Immature Granulocytes 0.07 0.00 - 0.07 K/uL    Comment: Performed at [facility], [address] [place], [place] [address]  Comprehensive metabolic panel     Status: Abnormal   Collection Time: 11/10/2018 12:11 AM  Result Value Ref Range   Sodium 136 135 - 145 mmol/L   Potassium 4.8 3.5 - 5.1 mmol/L   Chloride 105 98 - 111 mmol/L   CO2 18 (L) 22 - 32 mmol/L   Glucose, Bld 145 (H) 70 - 99 mg/dL   BUN 40 (H) 6 - 20 mg/dL   Creatinine, Ser 5.59 (H) 0.44 - 1.00 mg/dL   Calcium 8.4 (L) 8.9 - 10.3 mg/dL   Total Protein 7.2 6.5 - 8.1 g/dL   Albumin 2.7 (L) 3.5 - 5.0 g/dL   AST 79 (H) 15 - 41 U/L   ALT 38 0 - 44 U/L   Alkaline Phosphatase 116 38 - 126 U/L   Total Bilirubin 2.3 (H) 0.3 - 1.2 mg/dL   GFR calc non Af Amer 36 (L) >60 mL/min   GFR calc Af Amer 42 (L) >60 mL/min   Anion gap 13 5 - 15    Comment: Performed at [facility], [address] [place], [place] [address]  Ethanol     Status: None   Collection Time: 11/07/2018 12:11 AM  Result Value Ref Range   Alcohol, Ethyl (B) <10 <10 mg/dL    Comment: (NOTE) Lowest detectable limit for serum alcohol is 10 mg/dL. For medical purposes only. Performed at [facility], [address], [place], [place] [address]   Troponin I - ONCE - STAT     Status: Abnormal   Collection Time: 10/30/2018 12:11 AM  Result Value Ref Range   Troponin I 0.40 (HH) <0.03 ng/mL    Comment: CRITICAL RESULT CALLED TO, READ BACK BY AND VERIFIED WITH: TOBIAS,M RN 11/24/2018  0122 JORDANS Performed at [facility], [address]  [place], [place] [address]   Brain natriuretic peptide     Status: Abnormal   Collection Time: 10/29/2018 12:11 AM  Result Value Ref Range   B Natriuretic Peptide 2,020.6 (H) 0.0 - 100.0 pg/mL    Comment: Performed at [facility], [address], [place], [place] [address]  SARS Coronavirus 2 (CEPHEID - Performed in [facility] hospital lab), Hosp Order     Status: None   Collection Time: 10/29/2018 12:34 AM  Result Value Ref Range   SARS Coronavirus 2 NEGATIVE NEGATIVE    Comment: (NOTE) If result is NEGATIVE SARS-CoV-2 target nucleic acids are NOT DETECTED. The SARS-CoV-2 RNA is generally detectable in upper and lower  respiratory specimens during the acute phase of infection. The lowest  concentration of SARS-CoV-2 viral copies this assay can detect is 250  copies / mL. A negative result does not preclude SARS-CoV-2 infection  and should not be used as the sole basis for treatment or other  patient management decisions.  A negative result may occur with  improper specimen collection / handling, submission of specimen other  than nasopharyngeal swab, presence of viral mutation(s) within the  areas targeted by this assay, and inadequate number of viral copies  (<250 copies / mL). A negative result must be combined with clinical  observations, patient history, and epidemiological information. If result is POSITIVE SARS-CoV-2 target nucleic acids are DETECTED. The SARS-CoV-2 RNA is generally detectable in upper and lower  respiratory specimens dur ing the acute phase of infection.  Positive  results are indicative of active infection with SARS-CoV-2.  Clinical  correlation with patient history and other diagnostic information is  necessary to determine patient infection status.  Positive results do  not rule out bacterial infection or co-infection with other viruses. If result is PRESUMPTIVE POSTIVE SARS-CoV-2 nucleic acids MAY BE PRESENT.   A presumptive positive result was obtained  on the submitted specimen  and confirmed on repeat testing.  While 2019 novel coronavirus  (SARS-CoV-2) nucleic acids may be present in the submitted sample  additional confirmatory testing may be necessary for epidemiological  and / or clinical management purposes  to differentiate between  SARS-CoV-2 and other Sarbecovirus currently known to infect humans.  If clinically indicated additional testing with an alternate test  methodology [phone]) is advised. The SARS-CoV-2 RNA is generally  detectable in upper and lower respiratory sp ecimens during the acute  phase of infection. The expected result is Negative. Fact Sheet for Patients:  BoilerBrush.com.cy Fact Sheet for Healthcare Providers: https://pope.com/ This test is not yet approved or cleared by the [place] FDA and has been authorized for detection and/or diagnosis of SARS-CoV-2 by FDA under an Emergency Use Authorization (EUA).  This EUA will remain in effect (meaning this test can be used) for the duration of the COVID-19 declaration under Section 564(b)(1) of the Act, 21 U.S.C. section 360bbb-3(b)(1), unless the authorization is terminated or revoked sooner. Performed at [facility], [address] [place], [place] [address]   Lactic acid, plasma     Status: Abnormal   Collection Time: 11/20/2018  2:58 AM  Result Value Ref Range   Lactic Acid, Venous >11.0 (HH) 0.5 - 1.9 mmol/L    Comment: CRITICAL RESULT CALLED TO, READ BACK BY AND VERIFIED WITH: Alane Hanssen,M RN 10/31/2018 5621 JORDANS Performed at [facility], [address] [place], [place] [address]   APTT  Status: Abnormal   Collection Time: April 12, 2019  2:58 AM  Result Value Ref Range   aPTT 190 (HH) 24 - 36 seconds    Comment:        IF BASELINE aPTT IS ELEVATED, SUGGEST PATIENT RISK ASSESSMENT BE USED TO DETERMINE APPROPRIATE ANTICOAGULANT THERAPY. REPEATED TO VERIFY CRITICAL RESULT CALLED TO,  READ BACK BY AND VERIFIED WITH: RN M Dellie Piasecki @0530  04/23/2019 BY S GEZAHEGN Performed at [facility], [address] [place], [place] [address]   I-STAT 7, (LYTES, BLD GAS, ICA, H+H)     Status: Abnormal   Collection Time: April 12, 2019  3:02 AM  Result Value Ref Range   pH, Arterial 6.987 (LL) 7.350 - 7.450   pCO2 arterial 38.6 32.0 - 48.0 mmHg   pO2, Arterial 78.0 (L) 83.0 - 108.0 mmHg   Bicarbonate 9.2 (L) 20.0 - 28.0 mmol/L   TCO2 10 (L) 22 - 32 mmol/L   O2 Saturation 86.0 %   Acid-base deficit 22.0 (H) 0.0 - 2.0 mmol/L   Sodium 138 135 - 145 mmol/L   Potassium 4.7 3.5 - 5.1 mmol/L   Calcium, Ion 1.06 (L) 1.15 - 1.40 mmol/L   HCT 38.0 36.0 - 46.0 %   Hemoglobin 12.9 12.0 - 15.0 g/dL   Patient temperature 78.298.7 F    Collection site RADIAL, ALLEN'S TEST ACCEPTABLE    Drawn by RT    Sample type ARTERIAL    Comment NOTIFIED PHYSICIAN   Lactic acid, plasma     Status: Abnormal   Collection Time: April 12, 2019  5:17 AM  Result Value Ref Range   Lactic Acid, Venous 10.5 (HH) 0.5 - 1.9 mmol/L    Comment: CRITICAL RESULT CALLED TO, READ BACK BY AND VERIFIED WITH: Macenzie Burford,M RN October 15, 202020 0555 JORDANS Performed at [facility], [address] [place], [place] [address]    Dg Chest Portable 1 View  Result Date: 10/26/202020 CLINICAL DATA:  Central line placement EXAM: PORTABLE CHEST 1 VIEW COMPARISON:  October 15, 202020, 10/29/2018, CT chest 10/31/2018 FINDINGS: Endotracheal tube tip is about 3.8 cm superior to the carina. Esophageal tube tip below the diaphragm and in the left upper quadrant. New right IJ central venous catheter tip over the SVC. No pneumothorax. Worsened opacity throughout the right thorax. Cardiomegaly. Small left effusion IMPRESSION: 1. Right IJ central venous catheter tip over the distal SVC. No pneumothorax 2. Worsened hazy opacity of right thorax, likely combination of layering moderate pleural effusion and underlying airspace disease as there are increasing air bronchograms.  3. Cardiomegaly.  Probable small left effusion Electronically Signed   By: Jasmine PangKim  Fujinaga M.D.   On: October 15, 202020 03:16   Dg Chest Portable 1 View  Result Date: 10/26/202020 CLINICAL DATA:  Acute respiratory failure. Intubation. EXAM: PORTABLE CHEST 1 VIEW COMPARISON:  October 15, 202020 FINDINGS: There has been placement of an endotracheal tube, with tip approximately 3.7 cm above the carina. A nasogastric tube is also seen entering the stomach. Stable mild-to-moderate cardiomegaly. Small right pleural effusion and right basilar atelectasis are also unchanged. Left lung is clear. IMPRESSION: 1. Endotracheal tube and nasogastric tube in appropriate position. 2. Stable cardiomegaly, small right pleural effusion and right basilar atelectasis. Electronically Signed   By: Myles RosenthalJohn  Stahl M.D.   On: October 15, 202020 02:23   Dg Chest Port 1 View  Result Date: 10/26/202020 CLINICAL DATA:  Shortness of breath EXAM: PORTABLE CHEST 1 VIEW COMPARISON:  10/29/2018, 10/05/2018, CT 10/31/2018 FINDINGS: Cardiomegaly. Small moderate right pleural effusion without significant change. Central vascular congestion.  Dense consolidation at the right middle lobe and right base. IMPRESSION: 1. Cardiomegaly with vascular congestion. 2. Small-moderate right pleural effusion with dense consolidation at the right middle lobe and right base which may reflect pneumonia. Electronically Signed   By: Jasmine Pang M.D.   On: 11/10/2018 00:50    Pending Labs Unresulted Labs (From admission, onward)    Start     Ordered   11/27/18 0500  Comprehensive metabolic panel  Tomorrow morning,   R     11/21/2018 0618   11/27/18 0500  Lactic acid, plasma  Tomorrow morning,   R     11/11/2018 0618   11/27/18 0500  Brain natriuretic peptide  Tomorrow morning,   R     11/15/2018 0618   11/27/18 0500  CBC  Tomorrow morning,   R     11/02/2018 0618   11/04/2018 0700  Blood gas, arterial  Once,   R     10/30/2018 0618   11/23/2018 0610  Culture, respiratory (tracheal aspirate)   Once,   R     11/03/2018 0618   11/20/2018 0439  APTT  ONCE - STAT,   R    Comments:  Please redraw with blood from AFTER tenecteplase adminstration.    11/24/2018 0438   11/13/2018 0137  Culture, blood (Routine X 2) w Reflex to ID Panel  BLOOD CULTURE X 2,   R     11/14/2018 0138   11/14/2018 0137  Urinalysis, Routine w reflex microscopic  ONCE - STAT,   STAT     11/19/2018 0138   11/17/2018 0137  Urine culture  ONCE - STAT,   STAT     11/14/2018 0138   11/24/2018 0001  Rapid urine drug screen (hospital performed)  ONCE - STAT,   R     10/31/2018 0000          Vitals/Pain Today's Vitals   11/08/2018 0600 11/02/2018 0603 11/03/2018 0606 11/04/2018 0609  BP: (!) 153/120  (!) 50/30 (!) 56/32  Pulse: 80   64  Resp: (!) 21 19 18  (!) 8  Temp:      TempSrc:      SpO2: 100%   90%  Weight:      PainSc:        Isolation Precautions No active isolations  Medications Medications  amiodarone (NEXTERONE) 1.8 mg/mL load via infusion 150 mg (150 mg Intravenous Bolus from Bag 11/20/2018 0105)    Followed by  amiodarone (NEXTERONE PREMIX) 360-4.14 MG/200ML-% (1.8 mg/mL) IV infusion (0 mg/hr Intravenous Stopped 11/19/2018 0200)    Followed by  amiodarone (NEXTERONE PREMIX) 360-4.14 MG/200ML-% (1.8 mg/mL) IV infusion (0 mg/hr Intravenous Stopped 10/31/2018 0200)  azithromycin (ZITHROMAX) 500 mg in sodium chloride 0.9 % 250 mL IVPB (500 mg Intravenous Not Given 11/06/2018 0245)  propofol (DIPRIVAN) 1000 MG/100ML infusion (has no administration in time range)  sodium bicarbonate 150 mEq in dextrose 5 % 1,000 mL infusion ( Intravenous New Bag/Given 11/09/2018 0403)  0.9 %  sodium chloride infusion (has no administration in time range)  0.9 %  sodium chloride infusion (has no administration in time range)  norepinephrine (LEVOPHED) 4mg  in premix infusion (45 mcg/min Intravenous Rate/Dose Change 11/06/2018 0623)  phenylephrine (NEOSYNEPHRINE) 40-0.9 MG/250ML-% infusion (220 mcg/min Intravenous Rate/Dose Change 11/16/2018 0624)   DOPamine (INTROPIN) 800 mg in dextrose 5 % 250 mL (3.2 mg/mL) infusion (20 mcg/kg/min  63.5 kg Intravenous Rate/Dose Change 10/29/2018 0621)  famotidine (PEPCID) IVPB 20 mg premix (has no administration in  time range)  fentaNYL in NS (10mcg/ml) infusion-PREMIX (has no administration in time range)  levofloxacin (LEVAQUIN) IVPB 500 mg (has no administration in time range)  metroNIDAZOLE (FLAGYL) IVPB 500 mg (has no administration in time range)  amiodarone (CORDARONE) 150 mg in dextrose 5 % 100 mL bolus (0 mg Intravenous Stopped 20-Dec-2018 0200)  adenosine (ADENOCARD) 6 MG/2ML injection ( Intravenous Canceled Entry 12/20/18 0045)  etomidate (AMIDATE) injection (20 mg Intravenous Given 20-Dec-2018 0140)  succinylcholine (ANECTINE) injection (100 mg Intravenous Given December 20, 2018 0140)  EPINEPHrine (ADRENALIN) 1 MG/10ML injection (1 Syringe Intravenous Given 12/20/18 0156)  heparin bolus via infusion 3,000 Units (3,000 Units Intravenous Bolus from Bag Dec 20, 2018 0218)  EPINEPHrine (ADRENALIN) 1 MG/10ML injection (1 Syringe Intravenous Given 12/20/2018 0210)  tenecteplase (TNKASE) injection 35 mg (35 mg Intravenous Given 12-20-18 0258)  magnesium sulfate IVPB 2 g 50 mL (0 g Intravenous Stopped 12/20/2018 0307)  sodium chloride 0.9 % bolus 1,000 mL (0 mLs Intravenous Stopped December 20, 2018 0331)  vancomycin (VANCOCIN) 1,250 mg in sodium chloride 0.9 % 250 mL IVPB (0 mg Intravenous Stopped 12/20/2018 0512)  PHENYLephrine 40 mcg/ml in normal saline Adult IV Push Syringe (200 mcg Intravenous Given 20-Dec-2018 0242)  aztreonam (AZACTAM) 2 g in sodium chloride 0.9 % 100 mL IVPB (0 g Intravenous Stopped Dec 20, 2018 0512)  sodium bicarbonate injection 50 mEq (50 mEq Intravenous Given 12-20-18 0307)  PHENYLephrine 40 mcg/ml in normal saline Adult IV Push Syringe (200 mcg Intravenous Given 12-20-18 0349)  sodium chloride 0.9 % bolus 1,000 mL (1,000 mLs Intravenous New Bag/Given 2018-12-20 0442)  sodium chloride 0.9 % bolus 1,000 mL (1,000  mLs Intravenous New Bag/Given Dec 20, 2018 0445)    Followed by  sodium chloride 0.9 % bolus 1,000 mL (1,000 mLs Intravenous New Bag/Given 12/20/18 0446)  EPINEPHrine (ADRENALIN) 1 MG/10ML injection (1 mg Intravenous Given 2018-12-20 0535)  atropine injection (1 mg Intravenous Given 12-20-2018 0530)    Mobility non-ambulatory Low fall risk   Focused Assessments Cardiac Assessment Handoff:  Cardiac Rhythm: Normal sinus rhythm Lab Results  Component Value Date   CKTOTAL 1,334 (H) 05/17/2013   TROPONINI 0.40 (HH) December 20, 2018   Lab Results  Component Value Date   DDIMER 4.34 (H) 08/09/2018   Does the Patient currently have chest pain? No     R Recommendations: See Admitting Provider Note  Report given to:   Additional Notes:  Pt has been resuscitated x3

## 2018-11-27 NOTE — H&P (Signed)
NAME:  Christine Cobb, MRN:  [id number], DOB:  1975-08-30, LOS: 0 ADMISSION DATE:  02-Dec-2018, CONSULTATION DATE:  11/01/2018 REFERRING MD:  Blinda Leatherwood, CHIEF COMPLAINT:  Post arrest   Brief History   43 yo bf with history of severe, end stage CHF with ef 10% sp arrest and resusicitation  History of present illness   Patient is a 43 year old black female black female with a history of end-stage cardiomyopathy last ejection fraction was estimated at 10 to 15% earlier this year.  She presented in SVT narrow complex tachycardia.  She was treated aggressively and appropriately in the emergency room with adenosine.  She had resumption of normal sinus rhythm only to develop worsening respiratory status requiring intubation.  Shortly after that she developed wide-complex QRS requiring a long period of resuscitative effort in the emergency room.  She currently is on dopamine Neo-Synephrine and Levophed.  She did receive a therapeutic dose of systemic TPA for possible complicating pulmonary emboli diagnosed on last admission.  She was prescribed Eliquis but has received no therapy as outpatient since her previous admission with pulmonary embolus diagnosis.  Patient has a long history of polysubstance abuse including cocaine. Chest x-ray shows consolidated right lower lobe with right-sided pleural effusion.  This may represent consolidated pneumonia or may be compressive secondary to pleural effusion.  Patient currently is obtunded relatively hypotensive pulse of 80 requiring intermittent pharmacologic intervention on top of her 3 pressors to maintain adequate blood pressure and pulse. Her prognosis is extremely poor at this point based on her premorbid state.   Past Medical History   . Arm pain   . Bipolar affective disorder, currently manic, mild (HCC)   . CHF (congestive heart failure) (HCC)   . Eye globe prosthesis   . HTN (hypertension)   . Hyperthyroidism   . Sinus tachycardia      Significant  Hospital Events   Post arrest 11/04/2018  Consults:  NA  Procedures:  Intubation and central line placement 11/10/2018  Significant Diagnostic Tests:  BNP greater than 2000, d-dimer 4, lactic acid greater than 11, bedside echocardiogram per ER physician shows extraordinarily poor left ventricular contractility  Micro Data:  NA  Antimicrobials:  NA  Interim history/subjective:  NA  Objective   Blood pressure (!) 63/31, pulse 64, temperature 98.7 F (37.1 C), temperature source Oral, resp. rate 15, weight 63.5 kg, SpO2 95 %.    Vent Mode: PRVC FiO2 (%):  [100 %] 100 % Set Rate:  [15 bmp] 15 bmp Vt Set:  [440 mL-480 mL] 440 mL PEEP:  [5 cmH20] 5 cmH20 Plateau Pressure:  [17 cmH20-19 cmH20] 17 cmH20   Intake/Output Summary (Last 24 hours) at 11/18/2018 0537 Last data filed at 10/29/2018 0100 Gross per 24 hour  Intake 1350 ml  Output -  Net 1350 ml   Filed Weights   11/13/2018 0055  Weight: 63.5 kg    Examination: General: Poorly kept black female who appears significantly older than stated age HENT: Intubated Lungs: Coarse diminished right lower lobe breath sounds Cardiovascular: Regular distant Abdomen: Bowel sounds hypoactive benign Extremities: 2+ lower extremity edema Neuro: Sedated GU: Within normal limits  Resolved Hospital Problem list   NA  Assessment & Plan:  1.  Status post cardiac arrest: We will continue current therapy including pressors  2.  History of ejection fraction of 10 to 15%: Prognosis poor in light of previous cardiac history in combination with this morning's events.  Current condition appears terminal  3.  History of pulmonary  embolus: Patient received systemic TPA in the emergency room ordered for heparin through pharmacy with pharmacy to decide appropriate timing  4.  Right lower lobe effusion and consolidation: Will place on empiric Rocephin.  Should she survive she may ultimately require thoracentesis although with recent TPA therapy  will be unable to do this urgently  5.  Profound lactic acidosis with anion gap acidosis: Continue current resuscitative efforts  6.  History of substance abuse  7.  History of cavitary pneumonia  Best practice:  Diet: N.p.o. Pain/Anxiety/Delirium protocol (if indicated): Continuous fentanyl VAP protocol (if indicated): N/A DVT prophylaxis: Patient will be started on heparin drip GI prophylaxis: Pepcid Glucose control: Monitor Mobility: Bedrest Code Status: Full Family Communication: Unable to find urgency contact Disposition:   Labs   CBC: Recent Labs  Lab 12/24/18 0011  WBC 7.1  NEUTROABS 4.4  HGB 10.8*  HCT 33.2*  MCV 75.6*  PLT 298    Basic Metabolic Panel: Recent Labs  Lab December 24, 2018 0011  NA 136  K 4.8  CL 105  CO2 18*  GLUCOSE 145*  BUN 40*  CREATININE 1.74*  CALCIUM 8.4*   GFR: Estimated Creatinine Clearance: 37.1 mL/min (A) (by C-G formula based on SCr of 1.74 mg/dL (H)). Recent Labs  Lab 24-Dec-2018 0011 2018/12/24 0258  WBC 7.1  --   LATICACIDVEN  --  >11.0*    Liver Function Tests: Recent Labs  Lab 12/24/18 0011  AST 79*  ALT 38  ALKPHOS 116  BILITOT 2.3*  PROT 7.2  ALBUMIN 2.7*   No results for input(s): LIPASE, AMYLASE in the last 168 hours. No results for input(s): AMMONIA in the last 168 hours.  ABG    Component Value Date/Time   HCO3 18.7 (L) 05/17/2013 1505   TCO2 22 10/22/2016 0923   ACIDBASEDEF 5.8 (H) 05/17/2013 1505   O2SAT 55.8 05/17/2013 1505     Coagulation Profile: No results for input(s): INR, PROTIME in the last 168 hours.  Cardiac Enzymes: Recent Labs  Lab 12-24-2018 0011  TROPONINI 0.40*    HbA1C: Hgb A1c MFr Bld  Date/Time Value Ref Range Status  08/09/2018 04:50 PM 5.8 (H) 4.8 - 5.6 % Final    Comment:    (NOTE) Pre diabetes:          5.7%-6.4% Diabetes:              >6.4% Glycemic control for   <7.0% adults with diabetes   12/21/2014 07:00 AM 5.4 4.8 - 5.6 % Final    Comment:    (NOTE)          Pre-diabetes: 5.7 - 6.4         Diabetes: >6.4         Glycemic control for adults with diabetes: <7.0     CBG: No results for input(s): GLUCAP in the last 168 hours.  Review of Systems:   Patient intubated and sedated  Past Medical History  She,  has a past medical history of Arm pain, Bipolar affective disorder, currently manic, mild (HCC), CHF (congestive heart failure) (HCC), Eye globe prosthesis, HTN (hypertension), Hyperthyroidism, and Sinus tachycardia.   Surgical History    Past Surgical History:  Procedure Laterality Date  . DILATION AND CURETTAGE OF UTERUS    . EYE SURGERY       Social History   reports that she has been smoking cigarettes. She has never used smokeless tobacco. She reports current alcohol use. She reports current drug use. Drug: Marijuana.  Family History   Her family history includes Asthma in her son; Diabetes in her maternal uncle and paternal grandmother; Emphysema in an other family member; Hypertension in an other family member.   Allergies Allergies  Allergen Reactions  . Amoxil [Amoxicillin] Anaphylaxis    Did it involve swelling of the face/tongue/throat, SOB, or low BP? yes Did it involve sudden or severe rash/hives, skin peeling, or any reaction on the inside of your mouth or nose? yes Did you need to seek medical attention at a hospital or doctor's office? unknown When did it last happen?unknoen If all above answers are "NO", may proceed with cephalosporin use.   Marland Kitchen Hydroxyzine     Other reaction(s): Bleeding  . Ketorolac Tromethamine Hives and Swelling  . Prenatal [B-Plex Plus] Nausea Only  . Tegretol [Carbamazepine] Other (See Comments)    Swelling, skin peeling, skin discoloration  . B-Plex Plus Nausea Only    Prenatal   . Tegretol [Carbamazepine] Swelling and Other (See Comments)    Skin peeling Skin discoloring      Home Medications  Prior to Admission medications   Medication Sig Start Date End Date Taking?  Authorizing Provider  acetaminophen (TYLENOL) 325 MG tablet Take 2 tablets (650 mg total) by mouth every 6 (six) hours as needed for mild pain or headache. 11/15/18   Mullis, Kiersten P, DO  amantadine (SYMMETREL) 100 MG capsule Take 1 capsule (100 mg total) by mouth 2 (two) times daily. 11/15/18   Mullis, Kiersten P, DO  calcium carbonate (TUMS - DOSED IN MG ELEMENTAL CALCIUM) 500 MG chewable tablet Chew 1 tablet (200 mg of elemental calcium total) by mouth 2 (two) times daily as needed for indigestion or heartburn. 11/15/18   Mullis, Kiersten P, DO  carvedilol (COREG) 3.125 MG tablet Take 1 tablet (3.125 mg total) by mouth 2 (two) times daily with a meal. 11/15/18   Mullis, Kiersten P, DO  feeding supplement, ENSURE ENLIVE, (ENSURE ENLIVE) LIQD Take 237 mLs by mouth 3 (three) times daily between meals. 11/15/18   Mullis, Kiersten P, DO  furosemide (LASIX) 80 MG tablet Take 2 tablets (160 mg total) by mouth daily. 11/16/18   Mullis, Kiersten P, DO  guaiFENesin-dextromethorphan (ROBITUSSIN DM) 100-10 MG/5ML syrup Take 5 mLs by mouth every 4 (four) hours as needed for cough. 11/15/18   Mullis, Kiersten P, DO  haloperidol (HALDOL) 10 MG tablet Take 1 tablet (10 mg total) by mouth at bedtime. 11/15/18   Mullis, Kiersten P, DO  Melatonin 3 MG TABS Take 1-2 tablets (3-6 mg total) by mouth at bedtime as needed (for sleep). 11/15/18   Mullis, Kiersten P, DO  Multiple Vitamin (MULTIVITAMIN WITH MINERALS) TABS tablet Take 1 tablet by mouth daily. 11/16/18   Mullis, Kiersten P, DO  nicotine (NICODERM CQ - DOSED IN MG/24 HR) 7 mg/24hr patch Place 1 patch (7 mg total) onto the skin daily. 11/15/18   Mullis, Kiersten P, DO  polyethylene glycol (MIRALAX / GLYCOLAX) 17 g packet Take 17 g by mouth 2 (two) times daily. 11/15/18   Mullis, Kiersten P, DO  Rivaroxaban (XARELTO) 15 MG TABS tablet Take 1 tablet (15 mg total) by mouth 2 (two) times daily with a meal for 6 days. 11/15/18 11/21/18  Mullis, Kiersten P, DO  rivaroxaban  (XARELTO) 20 MG TABS tablet Take 1 tablet (20 mg total) by mouth daily with supper. 11/22/18   Mullis, Kiersten P, DO  rivaroxaban (XARELTO) 20 MG TABS tablet Take 1 tablet (20 mg total) by mouth  daily. 11/21/18   Mullis, Kiersten P, DO  tiotropium (SPIRIVA HANDIHALER) 18 MCG inhalation capsule Place 1 capsule (18 mcg total) into inhaler and inhale every morning. 11/15/18   Mullis, Kiersten P, DO  traZODone (DESYREL) 100 MG tablet Take 2 tablets (200 mg total) by mouth at bedtime. 11/15/18   Mullis, Kiersten P, DO     Critical care time: Over 35 minutes was spent in the evaluation and initiation of critical care therapy

## 2018-11-27 NOTE — Progress Notes (Signed)
Right IJ CVC left in patient due to TNK given to patient this AM. Patient transferred to morgue with belongings. No next of kin to notify of patient's death. [place], Mitzi Hansen

## 2018-11-27 NOTE — Progress Notes (Signed)
CRITICAL VALUE ALERT  Critical Value:  PTT: 192  Date & Time Notied:  11/21/2018 1045  Provider Notified: Dr. Tonia Brooms  Orders Received/Actions taken: n/a

## 2018-11-27 NOTE — Progress Notes (Signed)
Contacted Christine Cobb listed as patient's daughter-in-law. Upon speaking with Ms. Earlene Plater, she indicated she is not related to [place], and in fact her last name is not [place], it is [place]. States patient has sons but does not know how to get in touch with them. RN asked Ms. Earlene Plater to call hospital if she was able to get in touch with family. [place], Mitzi Hansen

## 2018-11-27 NOTE — Progress Notes (Signed)
150 cc of fentanyl wasted in sink, witnessed by Vanessa Ralphs RN and Prince Rome, RN

## 2018-11-27 NOTE — ED Provider Notes (Addendum)
[facility] EMERGENCY DEPARTMENT Provider Note   CSN: [id number] Arrival date & time: December 01, 2018  2352    History   Chief Complaint Chief Complaint  Patient presents with  . Tachycardia    HPI Christine Cobb is a 43 y.o. female.     Patient brought to the emergency department from home by EMS for evaluation of shortness of breath.  Patient reports she has been short of breath for some time but significantly worsened tonight.  EMS report that her heart rate was between 202 40 during transport.  Blood pressures have mostly been above 100 systolic but she has had a couple of values in the 70s.  She has not been given any fluids because of significant history of congestive heart failure.  Patient reports that she has been off of her meds for at least 2 weeks.  She reports that she has been drinking, smoking marijuana and using cocaine.     Past Medical History:  Diagnosis Date  . Arm pain   . Bipolar affective disorder, currently manic, mild (HCC)   . CHF (congestive heart failure) (HCC)   . Eye globe prosthesis   . HTN (hypertension)   . Hyperthyroidism   . Sinus tachycardia     Patient Active Problem List   Diagnosis Date Noted  . Hyponatremia   . Pulmonary embolism and infarction (HCC)   . Evaluation by psychiatric service required   . Cavitary pneumonia   . Substance abuse (HCC)   . CHF exacerbation (HCC) 10/29/2018  . Malnutrition of moderate degree 08/11/2018  . Hemoptysis 08/09/2018  . Respiratory failure (HCC)   . Chest pain   . Community acquired pneumonia of right lower lobe of lung (HCC)   . Bipolar affective disorder, manic, severe (HCC) 04/30/2017  . Severe bipolar affective disorder with psychosis (HCC) 04/29/2017  . 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 Schizoaffective disorder, bipolar type (HCC)   . Bipolar disorder, current episode manic without psychotic features, severe (HCC)   . Agitation   .  Manic behavior (HCC)   . Hyperthyroidism 09/21/2013  . Marijuana abuse 04/12/2013  . History of CHF (congestive heart failure) 02/01/2013  . Abscess of abdominal wall 10/18/2012  . HTN (hypertension) 04/08/2011  . Tachycardia 04/08/2011    Past Surgical History:  Procedure Laterality Date  . DILATION AND CURETTAGE OF UTERUS    . EYE SURGERY       OB History    Gravida  3   Para  2   Term  2   Preterm      AB  1   Living  2     SAB  1   TAB  0   Ectopic      Multiple      Live Births  2            Home Medications    Prior to Admission medications   Medication Sig Start Date End Date Taking? Authorizing Provider  acetaminophen (TYLENOL) 325 MG tablet Take 2 tablets (650 mg total) by mouth every 6 (six) hours as needed for mild pain or headache. 11/15/18   Mullis, Kiersten P, DO  amantadine (SYMMETREL) 100 MG capsule Take 1 capsule (100 mg total) by mouth 2 (two) times daily. 11/15/18   Mullis, Kiersten P, DO  calcium carbonate (TUMS - DOSED IN MG ELEMENTAL CALCIUM) 500 MG chewable tablet Chew 1 tablet (200 mg of elemental calcium total) by mouth  2 (two) times daily as needed for indigestion or heartburn. 11/15/18   Mullis, Kiersten P, DO  carvedilol (COREG) 3.125 MG tablet Take 1 tablet (3.125 mg total) by mouth 2 (two) times daily with a meal. 11/15/18   Mullis, Kiersten P, DO  feeding supplement, ENSURE ENLIVE, (ENSURE ENLIVE) LIQD Take 237 mLs by mouth 3 (three) times daily between meals. 11/15/18   Mullis, Kiersten P, DO  furosemide (LASIX) 80 MG tablet Take 2 tablets (160 mg total) by mouth daily. 11/16/18   Mullis, Kiersten P, DO  guaiFENesin-dextromethorphan (ROBITUSSIN DM) 100-10 MG/5ML syrup Take 5 mLs by mouth every 4 (four) hours as needed for cough. 11/15/18   Mullis, Kiersten P, DO  haloperidol (HALDOL) 10 MG tablet Take 1 tablet (10 mg total) by mouth at bedtime. 11/15/18   Mullis, Kiersten P, DO  Melatonin 3 MG TABS Take 1-2 tablets (3-6 mg total) by  mouth at bedtime as needed (for sleep). 11/15/18   Mullis, Kiersten P, DO  Multiple Vitamin (MULTIVITAMIN WITH MINERALS) TABS tablet Take 1 tablet by mouth daily. 11/16/18   Mullis, Kiersten P, DO  nicotine (NICODERM CQ - DOSED IN MG/24 HR) 7 mg/24hr patch Place 1 patch (7 mg total) onto the skin daily. 11/15/18   Mullis, Kiersten P, DO  polyethylene glycol (MIRALAX / GLYCOLAX) 17 g packet Take 17 g by mouth 2 (two) times daily. 11/15/18   Mullis, Kiersten P, DO  Rivaroxaban (XARELTO) 15 MG TABS tablet Take 1 tablet (15 mg total) by mouth 2 (two) times daily with a meal for 6 days. 11/15/18 11/21/18  Mullis, Kiersten P, DO  rivaroxaban (XARELTO) 20 MG TABS tablet Take 1 tablet (20 mg total) by mouth daily with supper. 11/22/18   Mullis, Kiersten P, DO  rivaroxaban (XARELTO) 20 MG TABS tablet Take 1 tablet (20 mg total) by mouth daily. 11/21/18   Mullis, Kiersten P, DO  tiotropium (SPIRIVA HANDIHALER) 18 MCG inhalation capsule Place 1 capsule (18 mcg total) into inhaler and inhale every morning. 11/15/18   Mullis, Kiersten P, DO  traZODone (DESYREL) 100 MG tablet Take 2 tablets (200 mg total) by mouth at bedtime. 11/15/18   Mullis, Dara Lords, DO    Family History Family History  Problem Relation Age of Onset  . Hypertension Other   . Emphysema Other   . Asthma Son   . Diabetes Maternal Uncle   . Diabetes Paternal Grandmother     Social History Social History   Tobacco Use  . Smoking status: Current Every Day Smoker    Types: Cigarettes    Last attempt to quit: 08/21/2013    Years since quitting: 5.2  . Smokeless tobacco: Never Used  Substance Use Topics  . Alcohol use: Yes    Comment: Last drink: 1/2 beer PTA  . Drug use: Yes    Types: Marijuana    Comment: Pt sts "anything and everything"     Allergies   Amoxil [amoxicillin]; Hydroxyzine; Ketorolac tromethamine; Prenatal [b-plex plus]; Tegretol [carbamazepine]; B-plex plus; and Tegretol [carbamazepine]   Review of Systems Review of  Systems  Respiratory: Positive for cough and shortness of breath.   All other systems reviewed and are negative.    Physical Exam Updated Vital Signs BP 104/67   Pulse 85   Temp 98.7 F (37.1 C) (Oral)   Resp (!) 22   Wt 63.5 kg   SpO2 93%   BMI 24.03 kg/m   Physical Exam Vitals signs and nursing note reviewed.  Constitutional:  General: She is in acute distress.     Appearance: Normal appearance. She is well-developed.  HENT:     Head: Normocephalic and atraumatic.     Right Ear: Hearing normal.     Left Ear: Hearing normal.     Nose: Nose normal.  Eyes:     Conjunctiva/sclera: Conjunctivae normal.     Pupils: Pupils are equal, round, and reactive to light.  Neck:     Musculoskeletal: Normal range of motion and neck supple.  Cardiovascular:     Rate and Rhythm: Regular rhythm. Tachycardia present.     Heart sounds: S1 normal and S2 normal. No murmur. No friction rub. No gallop.   Pulmonary:     Effort: Pulmonary effort is normal. Tachypnea present.     Breath sounds: Examination of the right-lower field reveals decreased breath sounds. Decreased breath sounds present.  Chest:     Chest wall: No tenderness.  Abdominal:     General: Bowel sounds are normal.     Palpations: Abdomen is soft.     Tenderness: There is no abdominal tenderness. There is no guarding or rebound. Negative signs include Murphy's sign and McBurney's sign.     Hernia: No hernia is present.  Musculoskeletal: Normal range of motion.  Skin:    General: Skin is warm and dry.     Findings: No rash.  Neurological:     Mental Status: She is alert and oriented to person, place, and time.     GCS: GCS eye subscore is 4. GCS verbal subscore is 5. GCS motor subscore is 6.     Cranial Nerves: No cranial nerve deficit.     Sensory: No sensory deficit.     Coordination: Coordination normal.  Psychiatric:        Speech: Speech normal.        Behavior: Behavior normal.        Thought Content:  Thought content normal.      ED Treatments / Results  Labs (all labs ordered are listed, but only abnormal results are displayed) Labs Reviewed  CBC WITH DIFFERENTIAL/PLATELET - Abnormal; Notable for the following components:      Result Value   Hemoglobin 10.8 (*)    HCT 33.2 (*)    MCV 75.6 (*)    MCH 24.6 (*)    RDW 17.4 (*)    nRBC 0.9 (*)    All other components within normal limits  COMPREHENSIVE METABOLIC PANEL - Abnormal; Notable for the following components:   CO2 18 (*)    Glucose, Bld 145 (*)    BUN 40 (*)    Creatinine, Ser 1.74 (*)    Calcium 8.4 (*)    Albumin 2.7 (*)    AST 79 (*)    Total Bilirubin 2.3 (*)    GFR calc non Af Amer 36 (*)    GFR calc Af Amer 42 (*)    All other components within normal limits  TROPONIN I - Abnormal; Notable for the following components:   Troponin I 0.40 (*)    All other components within normal limits  BRAIN NATRIURETIC PEPTIDE - Abnormal; Notable for the following components:   B Natriuretic Peptide 2,020.6 (*)    All other components within normal limits  LACTIC ACID, PLASMA - Abnormal; Notable for the following components:   Lactic Acid, Venous >11.0 (*)    All other components within normal limits  SARS CORONAVIRUS 2 (HOSPITAL ORDER, PERFORMED IN [facility])  CULTURE, BLOOD (ROUTINE X 2)  CULTURE, BLOOD (ROUTINE X 2)  URINE CULTURE  ETHANOL  RAPID URINE DRUG SCREEN, HOSP PERFORMED  URINALYSIS, ROUTINE W REFLEX MICROSCOPIC  APTT  I-STAT ARTERIAL BLOOD GAS, ED    EKG EKG Interpretation  Date/Time:  Sunday 10-Dec-2018 02:00:30 EDT Ventricular Rate:  101 PR Interval:    QRS Duration: 83 QT Interval:  346 QTC Calculation: 449 R Axis:   -14 Text Interpretation:  Sinus tachycardia Low voltage, extremity leads Minimal ST depression, anterolateral leads Confirmed by Gilda Crease [phone]) on Dec 10, 2018 3:42:38 AM   Radiology Dg Chest Portable 1 View  Result Date: 12-10-18 CLINICAL  DATA:  Central line placement EXAM: PORTABLE CHEST 1 VIEW COMPARISON:  12/10/18, 10/29/2018, CT chest 10/31/2018 FINDINGS: Endotracheal tube tip is about 3.8 cm superior to the carina. Esophageal tube tip below the diaphragm and in the left upper quadrant. New right IJ central venous catheter tip over the SVC. No pneumothorax. Worsened opacity throughout the right thorax. Cardiomegaly. Small left effusion IMPRESSION: 1. Right IJ central venous catheter tip over the distal SVC. No pneumothorax 2. Worsened hazy opacity of right thorax, likely combination of layering moderate pleural effusion and underlying airspace disease as there are increasing air bronchograms. 3. Cardiomegaly.  Probable small left effusion Electronically Signed   By: Jasmine Pang M.D.   On: 2018-12-10 03:16   Dg Chest Portable 1 View  Result Date: 12-10-18 CLINICAL DATA:  Acute respiratory failure. Intubation. EXAM: PORTABLE CHEST 1 VIEW COMPARISON:  Dec 10, 2018 FINDINGS: There has been placement of an endotracheal tube, with tip approximately 3.7 cm above the carina. A nasogastric tube is also seen entering the stomach. Stable mild-to-moderate cardiomegaly. Small right pleural effusion and right basilar atelectasis are also unchanged. Left lung is clear. IMPRESSION: 1. Endotracheal tube and nasogastric tube in appropriate position. 2. Stable cardiomegaly, small right pleural effusion and right basilar atelectasis. Electronically Signed   By: Myles Rosenthal M.D.   On: 12-10-2018 02:23   Dg Chest Port 1 View  Result Date: 12-10-18 CLINICAL DATA:  Shortness of breath EXAM: PORTABLE CHEST 1 VIEW COMPARISON:  10/29/2018, 10/05/2018, CT 10/31/2018 FINDINGS: Cardiomegaly. Small moderate right pleural effusion without significant change. Central vascular congestion. Dense consolidation at the right middle lobe and right base. IMPRESSION: 1. Cardiomegaly with vascular congestion. 2. Small-moderate right pleural effusion with dense consolidation  at the right middle lobe and right base which may reflect pneumonia. Electronically Signed   By: Jasmine Pang M.D.   On: 12/10/2018 00:50    Procedures .Critical Care Performed by: Gilda Crease, MD Authorized by: Gilda Crease, MD   Critical care provider statement:    Critical care time (minutes):  45   Critical care was time spent personally by me on the following activities:  Discussions with consultants, evaluation of patient's response to treatment, examination of patient, ordering and performing treatments and interventions, ordering and review of laboratory studies, ordering and review of radiographic studies, pulse oximetry, re-evaluation of patient's condition, obtaining history from patient or surrogate and review of old charts   I assumed direction of critical care for this patient from another provider in my specialty: no   Procedure Name: Intubation Date/Time: 12-10-18 3:43 AM Performed by: Gilda Crease, MD Pre-anesthesia Checklist: Patient identified, Patient being monitored, Emergency Drugs available, Timeout performed and Suction available Oxygen Delivery Method: Non-rebreather mask Preoxygenation: Pre-oxygenation with 100% oxygen Induction Type: Rapid sequence Ventilation: Mask ventilation without difficulty Laryngoscope Size: Glidescope and 3  Grade View: Grade I Tube size: 7.5 mm Number of attempts: 1 Placement Confirmation: ETT inserted through vocal cords under direct vision,  CO2 detector and Breath sounds checked- equal and bilateral Secured at: 23 cm Tube secured with: ETT holder Dental Injury: Teeth and Oropharynx as per pre-operative assessment     .Central Line Date/Time: 11/02/2018 3:44 AM Performed by: Gilda CreasePollina, Morgaine Kimball J, MD Authorized by: Gilda CreasePollina, Tita Terhaar J, MD   Consent:    Consent obtained:  Emergent situation Universal protocol:    Imaging studies available: yes     Required blood products, implants,  devices, and special equipment available: yes     Site/side marked: yes     Immediately prior to procedure, a time out was called: yes     Patient identity confirmed:  Hospital-assigned identification number Pre-procedure details:    Hand hygiene: Hand hygiene performed prior to insertion     Sterile barrier technique: All elements of maximal sterile technique followed     Skin preparation:  2% chlorhexidine   Skin preparation agent: Skin preparation agent completely dried prior to procedure   Anesthesia (see MAR for exact dosages):    Anesthesia method:  Local infiltration   Local anesthetic:  Lidocaine 1% w/o epi Procedure details:    Location:  R internal jugular   Patient position:  Flat   Procedural supplies:  Triple lumen   Catheter size:  7.5 Fr   Landmarks identified: yes     Ultrasound guidance: yes     Sterile ultrasound techniques: Sterile gel and sterile probe covers were used     Number of attempts:  1   Successful placement: yes   Post-procedure details:    Post-procedure:  Dressing applied and line sutured   Assessment:  Blood return through all ports, no pneumothorax on x-ray, free fluid flow and placement verified by x-ray   Patient tolerance of procedure:  Tolerated well, no immediate complications .Cardioversion Date/Time: 11/12/2018 3:45 AM Performed by: Gilda CreasePollina, Abijah Roussel J, MD Authorized by: Gilda CreasePollina, Lachae Hohler J, MD   Consent:    Consent obtained:  Emergent situation Universal protocol:    Immediately prior to procedure a time out was called: yes     Patient identity confirmed:  Hospital-assigned identification number Pre-procedure details:    Cardioversion basis:  Emergent   Rhythm:  Ventricular tachycardia   Electrode placement:  Anterior-posterior Attempt one:    Cardioversion mode:  Asynchronous   Waveform:  Biphasic   Shock (Joules):  120   Shock outcome:  Conversion to normal sinus rhythm Post-procedure details:    Patient status:   Unresponsive   Patient tolerance of procedure:  Tolerated well, no immediate complications .Cardioversion Date/Time: 11/20/2018 3:47 AM Performed by: Gilda CreasePollina, Synda Bagent J, MD Authorized by: Gilda CreasePollina, Sherwin Hollingshed J, MD   Consent:    Consent obtained:  Emergent situation Universal protocol:    Immediately prior to procedure a time out was called: yes     Patient identity confirmed:  Hospital-assigned identification number Pre-procedure details:    Cardioversion basis:  Emergent   Rhythm:  Ventricular tachycardia   Electrode placement:  Anterior-posterior Attempt one:    Cardioversion mode:  Asynchronous   Waveform:  Biphasic   Shock (Joules):  120   Shock outcome:  Conversion to other rhythm Attempt two:    Cardioversion mode:  Asynchronous   Waveform:  Biphasic   Shock (Joules):  150   Shock outcome:  Conversion to normal sinus rhythm Post-procedure details:    Patient status:  Unresponsive   Patient tolerance of procedure:  Tolerated well, no immediate complications   (including critical care time) Cardiopulmonary Resuscitation (CPR) Procedure Note Directed/Performed by: Gilda Crease I personally directed ancillary staff and/or performed CPR in an effort to regain return of spontaneous circulation and to maintain cardiac, neuro and systemic perfusion.    Medications Ordered in ED Medications  amiodarone (NEXTERONE) 1.8 mg/mL load via infusion 150 mg (150 mg Intravenous Bolus from Bag 05-Dec-2018 0105)    Followed by  amiodarone (NEXTERONE PREMIX) 360-4.14 MG/200ML-% (1.8 mg/mL) IV infusion (0 mg/hr Intravenous Stopped Dec 05, 2018 0200)    Followed by  amiodarone (NEXTERONE PREMIX) 360-4.14 MG/200ML-% (1.8 mg/mL) IV infusion (0 mg/hr Intravenous Stopped 12-05-18 0200)  azithromycin (ZITHROMAX) 500 mg in sodium chloride 0.9 % 250 mL IVPB (500 mg Intravenous Not Given 12-05-18 0245)  propofol (DIPRIVAN) 1000 MG/100ML infusion (has no administration in time range)  fentaNYL  in NS (30mcg/ml) infusion-PREMIX (0 mcg/hr Intravenous Not Given 12/05/2018 0245)  norepinephrine (LEVOPHED) 4mg  in premix infusion (35 mcg/min Intravenous Rate/Dose Change 12-05-18 0329)  fentaNYL in NS (28mcg/ml) infusion-PREMIX (50 mcg/hr Intravenous New Bag/Given 05-Dec-2018 0308)  vancomycin (VANCOCIN) 1,250 mg in sodium chloride 0.9 % 250 mL IVPB (1,250 mg Intravenous New Bag/Given 12-05-2018 0332)  aztreonam (AZACTAM) 2 g in sodium chloride 0.9 % 100 mL IVPB (2 g Intravenous New Bag/Given 12-05-18 0333)  PHENYLephrine 40 mcg/ml in normal saline Adult IV Push Syringe (has no administration in time range)  sodium bicarbonate 150 mEq in dextrose 5 % 1,000 mL infusion (has no administration in time range)  sodium chloride 0.9 % bolus 1,000 mL (has no administration in time range)  amiodarone (CORDARONE) 150 mg in dextrose 5 % 100 mL bolus (0 mg Intravenous Stopped 12-05-2018 0200)  adenosine (ADENOCARD) 6 MG/2ML injection ( Intravenous Canceled Entry 12-05-18 0045)  etomidate (AMIDATE) injection (20 mg Intravenous Given 12-05-2018 0140)  succinylcholine (ANECTINE) injection (100 mg Intravenous Given 12-05-2018 0140)  EPINEPHrine (ADRENALIN) 1 MG/10ML injection (1 Syringe Intravenous Given 12/05/2018 0156)  heparin bolus via infusion 3,000 Units (3,000 Units Intravenous Bolus from Bag 12/05/18 0218)  EPINEPHrine (ADRENALIN) 1 MG/10ML injection (1 Syringe Intravenous Given 05-Dec-2018 0210)  tenecteplase (TNKASE) injection 35 mg (35 mg Intravenous Given 2018-12-05 0258)  magnesium sulfate IVPB 2 g 50 mL (0 g Intravenous Stopped 12-05-18 0307)  sodium chloride 0.9 % bolus 1,000 mL (0 mLs Intravenous Stopped 12-05-18 0331)  PHENYLephrine 40 mcg/ml in normal saline Adult IV Push Syringe (200 mcg Intravenous Given 05-Dec-2018 0242)  sodium bicarbonate injection 50 mEq (50 mEq Intravenous Given 2018-12-05 0307)     Initial Impression / Assessment and Plan / ED Course  I have reviewed the triage vital  signs and the nursing notes.  Pertinent labs & imaging results that were available during my care of the patient were reviewed by me and considered in my medical decision making (see chart for details).        Patient presents to the emergency department for evaluation of shortness of breath.  EKG performed by EMS revealed narrow complex tachycardia.  At arrival she was still in a narrow complex tachycardia around 180 bpm.  Patient administered adenosine 6 mg and 12 mg without any benefit.  Based on her significant congestive heart failure history, it was felt that she would not tolerate beta-blocker or calcium channel blocker acutely.  Although she did have several values of blood pressure below 100 systolic, she was mostly normotensive and therefore did  not require emergent cardioversion.  She was then started on amiodarone.  After initial bolus continuous drip was started.  She did not have any benefit, cardiology was consulted phone.  They recommended repeating the amiodarone bolus and then continuing drip.  When this was performed, patient did convert into a sinus tachycardia around 100 bpm.  Sometime after this, however, patient's respiratory status declined.  She began to become extremely tachypneic, respiratory rate in the 50s.  This caused her to be quite agitated and therefore it was determined that she should be intubated.  This was performed without difficulty.  10 or 15 minutes after intubation, patient coded.  She became bradycardic and then her QRS complex widened, then became asystolic.  CPR was immediately started and she was given a dose of epi.  Rhythm check after the dose of epi revealed V. fib.  She was shocked at 120 J.  She briefly went into what looked like a torsades rhythm and then into a sinus rhythm.  She stabilized for some time and then had a second code similar to the first where she became bradycardic, then wide-complex then asystolic.  CPR initiated, administered epi and  then was in V. fib again.  He was shocked, rhythm converted to torsades, shocked again into sinus rhythm.  Patient administered magnesium 2 g IV.  Initial chest x-ray suspicious for right lower lobe pneumonia.  Reviewing her records, however, showed that she had this area at her previous hospitalization 2 or 3 weeks ago.  It is not clear if this is an active area of infection.  When the initial x-ray was reviewed, Rocephin and Zithromax was ordered.  This was discontinued, however, and more broad-spectrum antibiotics initiated when she became intubated.  Patient initiated on Levophed for blood pressure support.  She has done mostly well at this, but has required intermittent boluses of Neo-Synephrine to augment low blood pressures at times.  Reviewing her records also reveals that she had bilateral pulmonary embolisms diagnosed at her previous hospitalization.  She has not taken any medication since she left the hospital, has not been anticoagulated.  Based on her unstable rhythms and blood pressures with known bilateral PE, patient was administered TPA with PE protocol.  Prior to TPA, patient had central line placed by myself.  Blood gas was performed shows significant acidosis.  This is not a respiratory acidosis.  Lactic acid is markedly elevated, greater than 11.  She was given bicarb and will be fluid resuscitated.  Patient to be admitted by critical care service.  Addendum at 4:30 AM -nurse comes to be secondary to low blood pressures.  Levophed is maxed out.  She has been responding to intermittent boluses of Neo-Synephrine.  Will start Neo-Synephrine drip.  Awaiting critical care to evaluate the patient.  Final Clinical Impressions(s) / ED Diagnoses   Final diagnoses:  Narrow complex tachycardia (HCC)  Cardiac arrest with ventricular fibrillation (HCC)  Pulmonary embolism, other, unspecified chronicity, unspecified whether acute cor pulmonale present (HCC)  Acute respiratory failure with  hypoxia [facility])    ED Discharge Orders    None       Gilda Crease, MD 11/06/2018 1610    Gilda Crease, MD 10/29/2018 [phone]

## 2018-11-27 NOTE — Progress Notes (Signed)
PCCM brief follow-up note:  Patient has been transferred from the emergency department to the intensive care unit.  Subsequently intubated for respiratory failure status post cardiac arrest x3.  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  As well as a history of polysubstance abuse, cocaine abuse.  Patient was recently diagnosed with bilateral pulmonary emboli she was prescribed Eliquis but is unclear if she was compliant with therapy upon discharge.  She did receive systemic TPA in the emergency room peri-arrest.  Upon presentation to the intensive care unit the patient is maxed on 4 vasopressors to include epinephrine, norepinephrine, dopamine and Neo-Synephrine.  BP 103/62   Pulse (!) 142   Temp 98.7 F (37.1 C) (Oral)   Resp (!) 33   Wt 63.5 kg   SpO2 100%   BMI 24.03 kg/m   General: Chronically ill-appearing young female intubated on mechanical ventilation. ENT: Left artificial eye, right pupil 10 mm fixed dilated unresponsive to light. Neck: Right internal jugular CVC placed by ED, bedside ultrasound completed to evaluate enlarging hematoma within the right neck.  Central venous line appears to be going into the internal jugular vein under ultrasound visualization.  As well as normal venous wave tracing from CVP tracing. Heart: Regular rate and rhythm S1-S2 Lungs: Bilateral ventilated breath sounds, diminished in the bilateral bases Extremities: Significant bilateral lower extremity edema  Labs reviewed Lactate greater than 10 COVID-19 negative  Chest x-ray reviewed: Bilateral multifocal lung opacities layering right-sided effusion small left effusion CVC appropriately placed. The patient's images have been independently reviewed by me.    A: Cardiac arrest Cardiogenic shock Multiorgan failure End-stage cardiomyopathy Dilated cardiomyopathy Polysubstance abuse, cocaine use Lactic acidosis, anion gap metabolic  acidosis Acute renal failure Elevated troponin, elevated proBNP Acute on chronic systolic heart failure Bilateral pulmonary emboli, noncompliant with therapy, now status post systemic TPA  P: Wean off Neo-Synephrine Continue vasopressor support to maintain mean arterial pressure greater than 65 with norepinephrine and epinephrine. Attempt to slowly wean from dopamine. We will try to reach out to family. Bedside ultrasound completed for evaluation of the patient's myocardium which revealed a very low ejection fraction some bowing of the septum of the left ventricle into the right, right ventricle was not freely observed.  Parasternal short and long axis views revealed poor mitral valve plane excursion.  Normal-appearing LVOT and good views of the aortic valve.  No obvious vegetation. Once stabilized from a pressure standpoint would recommend stat CT scan of the head.  These have been ordered by the ED but has not been completed. Patient's overall prognosis is extremely poor.  I suspect that she will pass from this disease process.  I will attempt again to reach out to family.  The nursing staff was able to reach a person via phone that knows where the family lives.  They are going to attempt to find someone to have them call us urgently for an update.  This patient is critically ill with multiple organ system failure; which, requires frequent high complexity decision making, assessment, support, evaluation, and titration of therapies. This was completed through the application of advanced monitoring technologies and extensive interpretation of multiple databases. During this encounter critical care time was devoted to patient care services described in this note for 55 minutes.   Josephine Igo, DO [facility] 11/14/2018 8:32 AM  Personal pager: [phone] If unanswered, please page CCM On-call: [phone]

## 2018-11-27 DEATH — deceased

## 2018-12-01 LAB — CULTURE, BLOOD (ROUTINE X 2)
Culture: NO GROWTH
Culture: NO GROWTH
Special Requests: ADEQUATE

## 2018-12-21 DIAGNOSIS — I469 Cardiac arrest, cause unspecified: Secondary | ICD-10-CM

## 2018-12-21 DIAGNOSIS — I4719 Other supraventricular tachycardia: Secondary | ICD-10-CM

## 2018-12-21 DIAGNOSIS — I2699 Other pulmonary embolism without acute cor pulmonale: Secondary | ICD-10-CM

## 2018-12-21 DIAGNOSIS — I471 Supraventricular tachycardia: Secondary | ICD-10-CM

## 2018-12-21 DIAGNOSIS — J9601 Acute respiratory failure with hypoxia: Secondary | ICD-10-CM

## 2018-12-27 NOTE — Death Summary Note (Signed)
DEATH SUMMARY   Patient Details  Name: Christine Cobb MRN: [id number] DOB: August 29, 1975  Admission/Discharge Information   Admit Date:  12-20-18  Date of Death: Date of Death: 12/21/2018  Time of Death: Time of Death: 1345  Length of Stay: 1  Referring Physician: Patient, No Pcp Per   Reason(s) for Hospitalization  43 yo bf with history of severe, end stage CHF with ef 10% sp arrest and resusicitation  Diagnoses  Preliminary cause of death:  Secondary Diagnoses (including complications and co-morbidities):  Active Problems:   HTN (hypertension)   History of CHF (congestive heart failure)   Marijuana abuse   Schizoaffective disorder, bipolar type (HCC)   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 year old female who  with a history of end-stage cardiomyopathy last ejection fraction was estimated at 10 to 15% earlier this year.  She presented in SVT narrow complex tachycardia.  She was treated aggressively and appropriately in the emergency room with adenosine.  She had resumption of normal sinus rhythm only to develop worsening respiratory status requiring intubation.  Shortly after that she developed wide-complex QRS requiring a long period of resuscitative effort in the emergency room.  She currently is on dopamine Neo-Synephrine and Levophed.  She did receive a therapeutic dose of systemic TPA for possible complicating pulmonary emboli diagnosed on last admission.  She was prescribed Eliquis but has received no therapy as  outpatient since her previous admission with pulmonary embolus diagnosis.  Patient has a long history of polysubstance abuse including cocaine. Chest x-ray shows consolidated right lower lobe with right-sided pleural effusion.  This may represent consolidated pneumonia or may be compressive secondary to pleural effusion.  Patient currently is obtunded relatively hypotensive pulse of 80 requiring intermittent pharmacologic intervention on top of her 3 pressors to maintain adequate blood pressure and pulse. Her prognosis is extremely poor at this point based on her premorbid state.  Patient has been transferred from the emergency department to the intensive care unit.  Subsequently intubated for respiratory failure status post cardiac arrest x3.  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  As well as a history of polysubstance abuse, cocaine abuse.  Patient was recently diagnosed with bilateral pulmonary emboli she was prescribed Eliquis but is unclear if she was compliant with therapy upon discharge.  She did receive systemic TPA in the emergency room peri-arrest.  Upon presentation to the intensive care unit the patient is maxed on 4 vasopressors to include epinephrine, norepinephrine, dopamine and Neo-Synephrine.  Cardiac arrest Cardiogenic shock Multiorgan failure End-stage cardiomyopathy Dilated cardiomyopathy Polysubstance abuse, cocaine use Lactic acidosis, anion gap metabolic acidosis Acute renal failure Elevated troponin, elevated proBNP Acute on chronic systolic heart failure Bilateral pulmonary emboli, noncompliant with therapy, now status post systemic TPA  "Palliative Care Consult Note  Christine Cobb is well known to me from her prior hospitalizations. I have had discussions with her in the past about her EOL wishes given her end stage CHF and multiple other comorbidities-profound psychiatric illness, ongoing  crack cocaine  abuse, recurring necrotizing pneumonias and homelessness.  She has expressed to me on prior consults she would not want to be "kept alive by machines". She knew how sick she was and that she was terminal- when I had chances to speak with her when she was not having decompensated psych issues.  The compassionate and humane thing as well as the right thing medically to do for her is to remove her from the vent and allow a natural death to occur. She has no guardian and no surrogate decision makers. There are estranged family members but we have never been successful in contacting them in the past.  Recommend terminal wean given her current condition and neurological devastation- all irreversible and will result in her death despite our current interventions. I have discussed this in detail with Dr. Linna CapriceIcard-we will proceed with initiation of comfort care, and in line with [place] Statutes regarding patients with no medical surrogate decision makers. Called listed contact who would not provide information and did not seem reliable, capable or knowledgeable about the patients condition.  Orders for comfort care will be placed.  [name], DO Palliative Medicine" Please see above text copied from Dr. Phillips OdorGolding consult - 11/03/2018 1:05PM  The patient was seen by palliative medicine team and comfort care orders were placed by the palliative medicine service. The patient passed in the ICU   Pertinent Labs and Studies  Significant Diagnostic Studies Ct Head Wo Contrast  Result Date: 11/24/2018 CLINICAL DATA:  Patient status post cardiopulmonary resuscitation this morning. EXAM: CT HEAD WITHOUT CONTRAST TECHNIQUE: Contiguous axial images were obtained from the base of the skull through the vertex without intravenous contrast. COMPARISON:  Head CT scan 03/05/2015. FINDINGS: Brain: No evidence of acute infarction, hemorrhage, hydrocephalus, extra-axial collection or mass lesion/mass effect. Vascular: No  hyperdense vessel or unexpected calcification. Skull: Intact.  No focal lesion. Sinuses/Orbits: Left mastoid effusion is noted. Other: Endotracheal tube and OG tube are identified. IMPRESSION: No acute abnormality. Electronically Signed   By: Drusilla Kannerhomas  Dalessio M.D.   On: 11/08/2018 10:37   Ct Angio Chest Pe W Or Wo Contrast  Result Date: 11/10/2018 CLINICAL DATA:  Short of breath EXAM: CT ANGIOGRAPHY CHEST WITH CONTRAST TECHNIQUE: Multidetector CT imaging of the chest was performed using the standard protocol during bolus administration of intravenous contrast. Multiplanar CT image reconstructions and MIPs were obtained to evaluate the vascular anatomy. CONTRAST:  80mL OMNIPAQUE IOHEXOL 300 MG/ML  SOLN COMPARISON:  10/31/2018 FINDINGS: Cardiovascular: There are no filling defects in the pulmonary arterial tree to suggest acute pulmonary thromboembolism. Small defects within segmental and subsegmental branches in the lower lobes have resolved. The heart is enlarged. The ascending aorta is nonaneurysmal. Mediastinum/Nodes: No abnormal mediastinal adenopathy. There is fluid density within the mediastinal fat likely region due to edema. No pericardial effusion. Lungs/Pleura: No pneumothorax. There is extensive consolidation and ground-glass opacifications throughout both lungs which has significantly worsened since the prior study. Small bilateral pleural effusions right greater than left. Endotracheal tube is in place with its tip 2.9 cm from the carina. Upper Abdomen: NG tube is in place with its tip beyond the gastroesophageal junction. Musculoskeletal: No vertebral compression deformity. Review of the MIP images confirms the above findings. IMPRESSION: No evidence of acute pulmonary thromboembolism. Extensive consolidation and ground-glass opacification throughout both lungs which has significantly worsened. Small bilateral pleural effusions right greater than left. Electronically Signed   By: Jolaine ClickArthur  Hoss M.D.    On: 10/28/2018 10:39   Dg  Chest Portable 1 View  Result Date: 15-Dec-2018 CLINICAL DATA:  Central line placement EXAM: PORTABLE CHEST 1 VIEW COMPARISON:  2018/12/15 FINDINGS: Multifocal right lung opacities. Possible perihilar left lung opacity, equivocal. Defibrillator pads overlie the left hemithorax. Moderate layering right pleural effusion.  No pneumothorax. Endotracheal tube terminates 4 cm above the carina. Enteric tube courses below the diaphragm. Right IJ venous catheter terminates in the lower SVC. Cardiomegaly. Overall appearance is grossly unchanged. IMPRESSION: Endotracheal tube terminates 4 cm above the carina. Additional support apparatus as above. Multifocal right lung opacities with moderate layering right pleural effusion, unchanged. Electronically Signed   By: Julian Hy M.D.   On: Dec 15, 2018 06:29   Dg Chest Portable 1 View  Result Date: 2018-12-15 CLINICAL DATA:  Central line placement EXAM: PORTABLE CHEST 1 VIEW COMPARISON:  12/15/2018, 10/29/2018, CT chest 10/31/2018 FINDINGS: Endotracheal tube tip is about 3.8 cm superior to the carina. Esophageal tube tip below the diaphragm and in the left upper quadrant. New right IJ central venous catheter tip over the SVC. No pneumothorax. Worsened opacity throughout the right thorax. Cardiomegaly. Small left effusion IMPRESSION: 1. Right IJ central venous catheter tip over the distal SVC. No pneumothorax 2. Worsened hazy opacity of right thorax, likely combination of layering moderate pleural effusion and underlying airspace disease as there are increasing air bronchograms. 3. Cardiomegaly.  Probable small left effusion Electronically Signed   By: Donavan Foil M.D.   On: 2018-12-15 03:16   Dg Chest Portable 1 View  Result Date: 2018/12/15 CLINICAL DATA:  Acute respiratory failure. Intubation. EXAM: PORTABLE CHEST 1 VIEW COMPARISON:  15-Dec-2018 FINDINGS: There has been placement of an endotracheal tube, with tip approximately 3.7 cm  above the carina. A nasogastric tube is also seen entering the stomach. Stable mild-to-moderate cardiomegaly. Small right pleural effusion and right basilar atelectasis are also unchanged. Left lung is clear. IMPRESSION: 1. Endotracheal tube and nasogastric tube in appropriate position. 2. Stable cardiomegaly, small right pleural effusion and right basilar atelectasis. Electronically Signed   By: Earle Gell M.D.   On: 2018/12/15 02:23   Dg Chest Port 1 View  Result Date: 12-15-18 CLINICAL DATA:  Shortness of breath EXAM: PORTABLE CHEST 1 VIEW COMPARISON:  10/29/2018, 10/05/2018, CT 10/31/2018 FINDINGS: Cardiomegaly. Small moderate right pleural effusion without significant change. Central vascular congestion. Dense consolidation at the right middle lobe and right base. IMPRESSION: 1. Cardiomegaly with vascular congestion. 2. Small-moderate right pleural effusion with dense consolidation at the right middle lobe and right base which may reflect pneumonia. Electronically Signed   By: Donavan Foil M.D.   On: 12/15/18 00:50    Microbiology No results found for this or any previous visit (from the past 240 hour(s)).  Lab Basic Metabolic Panel: No results for input(s): NA, K, CL, CO2, GLUCOSE, BUN, CREATININE, CALCIUM, MG, PHOS in the last 168 hours. Liver Function Tests: No results for input(s): AST, ALT, ALKPHOS, BILITOT, PROT, ALBUMIN in the last 168 hours. No results for input(s): LIPASE, AMYLASE in the last 168 hours. No results for input(s): AMMONIA in the last 168 hours. CBC: No results for input(s): WBC, NEUTROABS, HGB, HCT, MCV, PLT in the last 168 hours. Cardiac Enzymes: No results for input(s): CKTOTAL, CKMB, CKMBINDEX, TROPONINI in the last 168 hours. Sepsis Labs: No results for input(s): PROCALCITON, WBC, LATICACIDVEN in the last 168 hours.  Procedures/Operations   CVC Systemic TPA   Octavio Graves Froylan Hobby 12/21/2018, 9:38 AM

## 2019-01-15 ENCOUNTER — Telehealth: Payer: Self-pay

## 2019-01-15 NOTE — Telephone Encounter (Signed)
Received dc from [employer].  Dc is for cremation and a patient of Doctor Icard.  DC will be taken to Pulmonary Unit for Signature.  On 01/17/2019 Received signed dc back from Doctor Icard. I called the funeral home to let them know the dc is ready for pickup and also faxed a copy per their request.

## 2020-01-09 IMAGING — DX PORTABLE CHEST - 1 VIEW
1 series · 1 of 1 positions shown · non-contrast
Comparison: 11/26/2018

CLINICAL DATA: Central line placement

EXAM:
PORTABLE CHEST 1 VIEW

[chest ap]
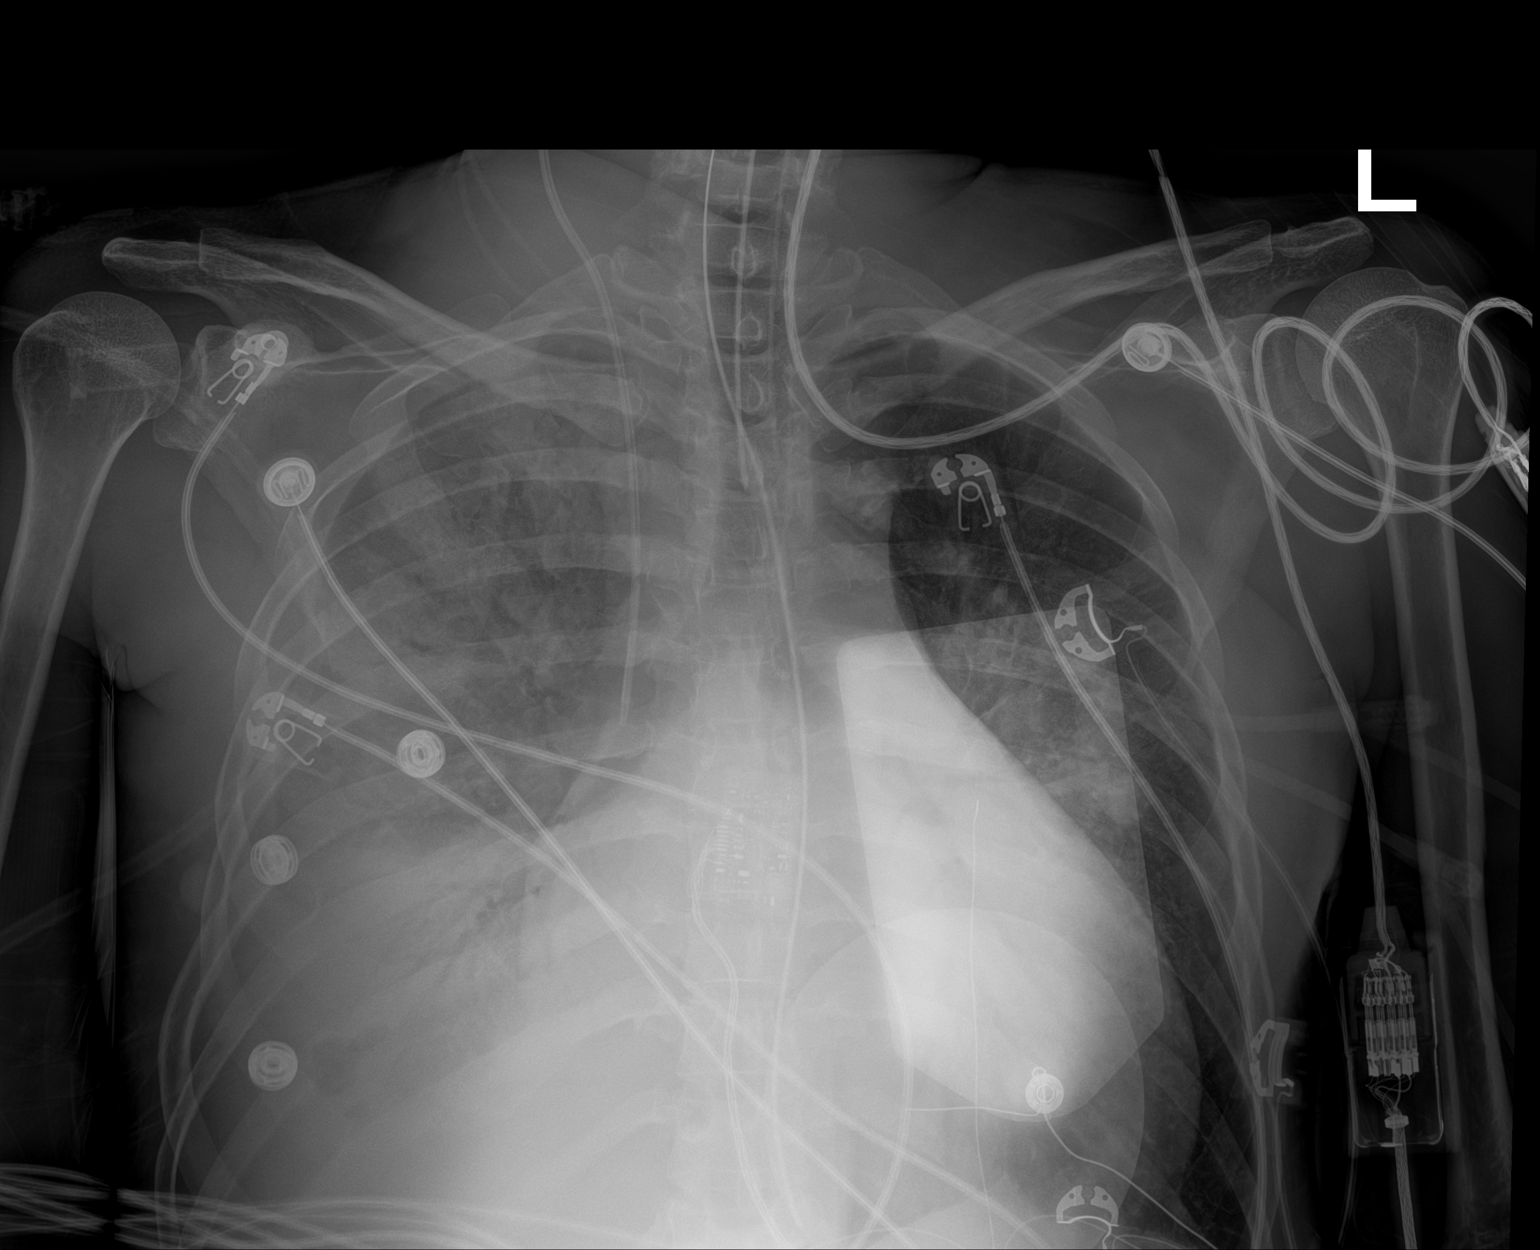

[1 of 1 positions shown; findings below may reference images not displayed]

FINDINGS: Multifocal right lung opacities. Possible perihilar left lung
opacity, equivocal. Defibrillator pads overlie the left hemithorax.

Moderate layering right pleural effusion.  No pneumothorax.

Endotracheal tube terminates 4 cm above the carina. Enteric tube
courses below the diaphragm. Right IJ venous catheter terminates in
the lower SVC.

Cardiomegaly.

Overall appearance is grossly unchanged.
IMPRESSION: Endotracheal tube terminates 4 cm above the carina. Additional
support apparatus as above.

Multifocal right lung opacities with moderate layering right pleural
effusion, unchanged.

## 2020-01-09 IMAGING — DX PORTABLE CHEST - 1 VIEW
1 series · 1 of 1 positions shown · non-contrast
Comparison: 10/29/2018, 10/05/2018, CT 10/31/2018

CLINICAL DATA: Shortness of breath

EXAM:
PORTABLE CHEST 1 VIEW

[chest ap]
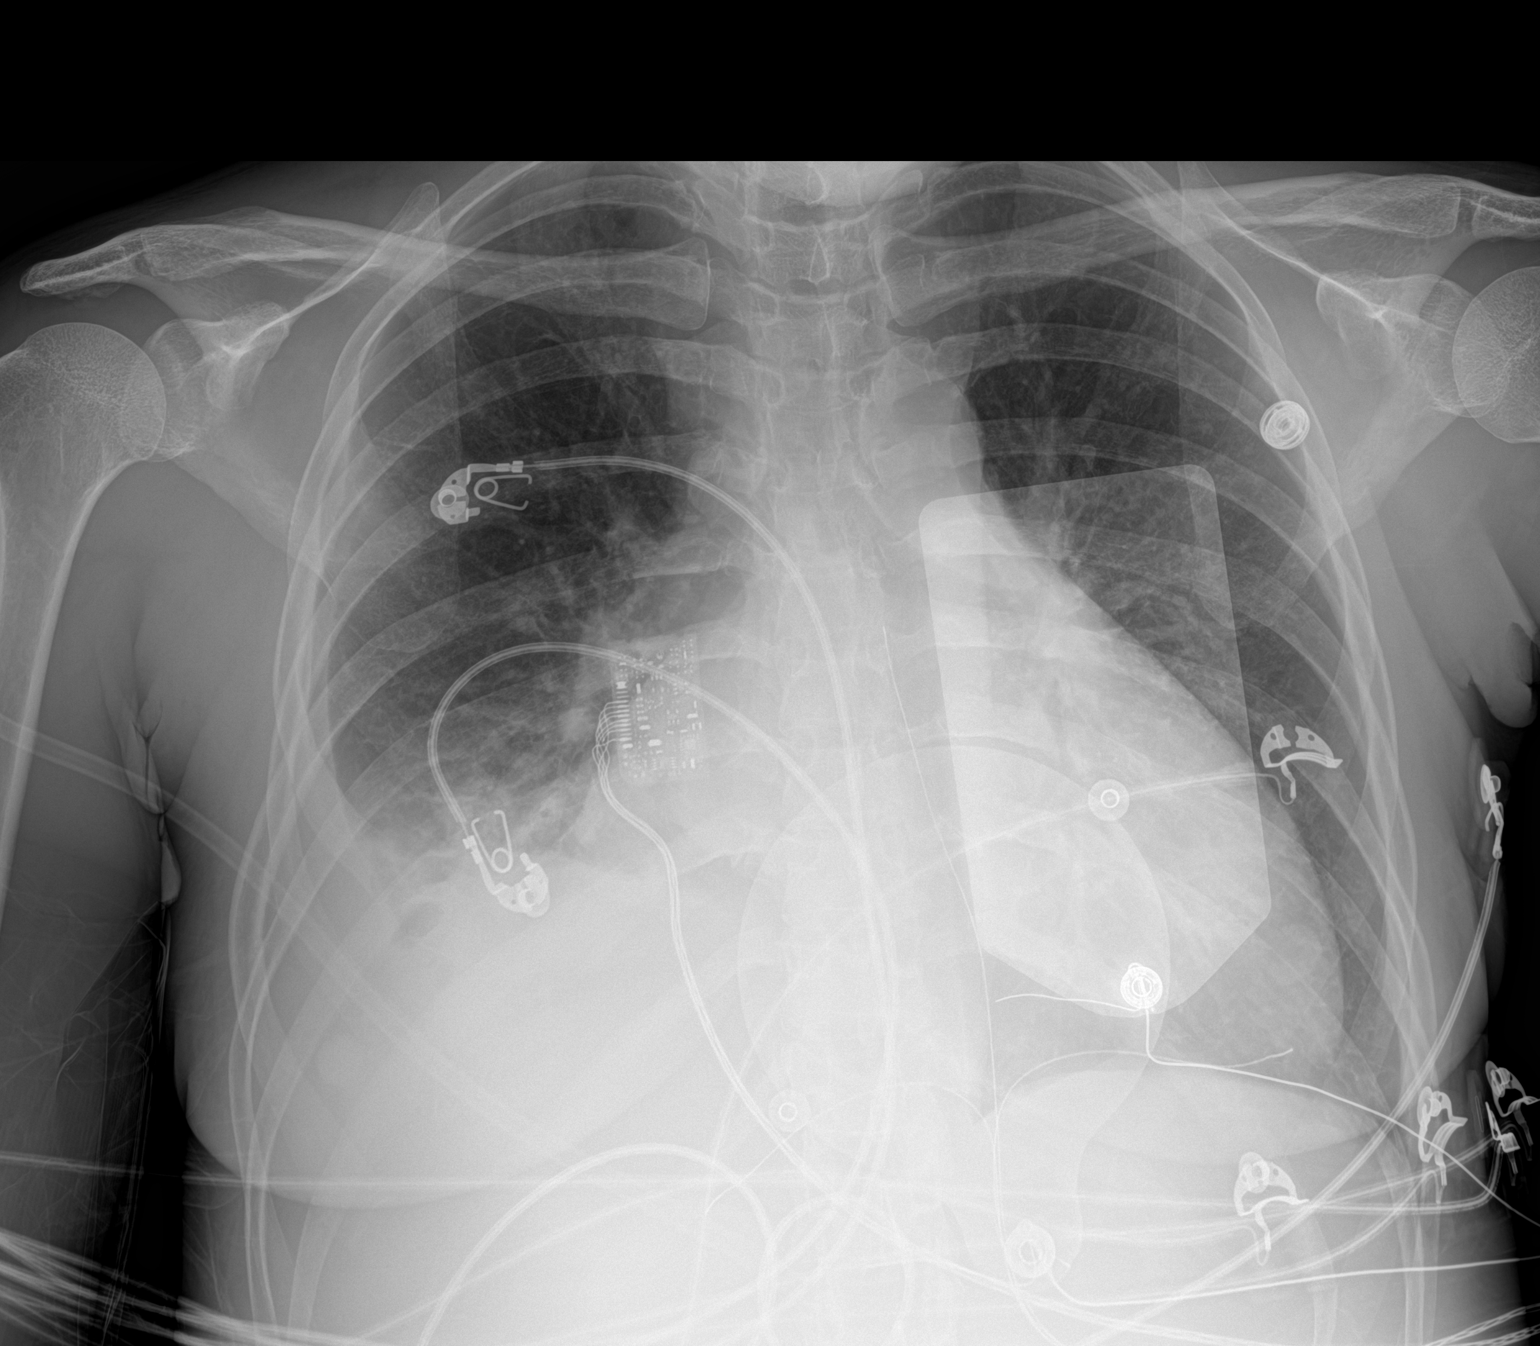

[1 of 1 positions shown; findings below may reference images not displayed]

FINDINGS: Cardiomegaly. Small moderate right pleural effusion without
significant change. Central vascular congestion. Dense consolidation
at the right middle lobe and right base.
IMPRESSION: 1. Cardiomegaly with vascular congestion.
2. Small-moderate right pleural effusion with dense consolidation at
the right middle lobe and right base which may reflect pneumonia.
